# Patient Record
Sex: Female | Born: 1955 | Race: White | Hispanic: No | Marital: Married | Smoking: Current every day smoker
Health system: Southern US, Community
[De-identification: ages and names within clinical notes are randomized; demographics above are authoritative.]

## PROBLEM LIST (undated history)

## (undated) DIAGNOSIS — C801 Malignant (primary) neoplasm, unspecified: Secondary | ICD-10-CM

## (undated) DIAGNOSIS — J90 Pleural effusion, not elsewhere classified: Secondary | ICD-10-CM

## (undated) DIAGNOSIS — E782 Mixed hyperlipidemia: Secondary | ICD-10-CM

## (undated) DIAGNOSIS — Z8489 Family history of other specified conditions: Secondary | ICD-10-CM

## (undated) DIAGNOSIS — I10 Essential (primary) hypertension: Secondary | ICD-10-CM

## (undated) DIAGNOSIS — R011 Cardiac murmur, unspecified: Secondary | ICD-10-CM

## (undated) DIAGNOSIS — J189 Pneumonia, unspecified organism: Secondary | ICD-10-CM

## (undated) HISTORY — DX: Mixed hyperlipidemia: E78.2

## (undated) HISTORY — PX: ABDOMINAL HYSTERECTOMY: SHX81

## (undated) HISTORY — DX: Essential (primary) hypertension: I10

## (undated) HISTORY — DX: Cardiac murmur, unspecified: R01.1

## (undated) HISTORY — PX: VESICOVAGINAL FISTULA CLOSURE W/ TAH: SUR271

## (undated) HISTORY — PX: MULTIPLE TOOTH EXTRACTIONS: SHX2053

## (undated) HISTORY — PX: CARPAL TUNNEL RELEASE: SHX101

## (undated) HISTORY — PX: GANGLION CYST EXCISION: SHX1691

## (undated) HISTORY — PX: SHOULDER OPEN ROTATOR CUFF REPAIR: SHX2407

## (undated) HISTORY — PX: OTHER SURGICAL HISTORY: SHX169

---

## 1982-11-19 HISTORY — PX: PARTIAL HYSTERECTOMY: SHX80

## 1995-11-20 HISTORY — PX: OOPHORECTOMY: SHX86

## 2000-03-28 ENCOUNTER — Ambulatory Visit (HOSPITAL_COMMUNITY): Admission: RE | Admit: 2000-03-28 | Discharge: 2000-03-28 | Payer: Self-pay | Admitting: Family Medicine

## 2000-03-28 ENCOUNTER — Encounter: Payer: Self-pay | Admitting: Family Medicine

## 2004-02-22 ENCOUNTER — Emergency Department (HOSPITAL_COMMUNITY): Admission: EM | Admit: 2004-02-22 | Discharge: 2004-02-22 | Payer: Self-pay | Admitting: Emergency Medicine

## 2004-03-07 ENCOUNTER — Encounter: Payer: Self-pay | Admitting: Orthopedic Surgery

## 2004-04-04 ENCOUNTER — Encounter: Payer: Self-pay | Admitting: Orthopedic Surgery

## 2004-04-04 ENCOUNTER — Ambulatory Visit (HOSPITAL_COMMUNITY): Admission: RE | Admit: 2004-04-04 | Discharge: 2004-04-04 | Payer: Self-pay | Admitting: Orthopedic Surgery

## 2004-08-02 ENCOUNTER — Encounter: Payer: Self-pay | Admitting: Orthopedic Surgery

## 2007-06-07 ENCOUNTER — Emergency Department (HOSPITAL_COMMUNITY): Admission: EM | Admit: 2007-06-07 | Discharge: 2007-06-07 | Payer: Self-pay | Admitting: Emergency Medicine

## 2009-07-12 ENCOUNTER — Ambulatory Visit: Payer: Self-pay | Admitting: Orthopedic Surgery

## 2009-07-12 ENCOUNTER — Encounter (INDEPENDENT_AMBULATORY_CARE_PROVIDER_SITE_OTHER): Payer: Self-pay | Admitting: *Deleted

## 2009-07-12 DIAGNOSIS — R0789 Other chest pain: Secondary | ICD-10-CM

## 2009-07-12 DIAGNOSIS — R079 Chest pain, unspecified: Secondary | ICD-10-CM | POA: Insufficient documentation

## 2009-07-12 DIAGNOSIS — Z720 Tobacco use: Secondary | ICD-10-CM | POA: Insufficient documentation

## 2011-04-06 NOTE — H&P (Signed)
NAME:  Deborah Cooley, Deborah Cooley                        ACCOUNT NO.:  [id number]   MEDICAL RECORD NO.:  [id number]                   PATIENT TYPE:  AMB   LOCATION:  DAY                                  FACILITY:  [facility]   PHYSICIAN:  Vickki Hearing, M.D.           DATE OF BIRTH:  1955/12/15   DATE OF ADMISSION:  DATE OF DISCHARGE:                                HISTORY & PHYSICAL   CHIEF COMPLAINT:  1. Pain and paresthesias, right upper extremity.  2. Dorsal radial mass, right upper extremity.   HISTORY OF PRESENT ILLNESS:  The patient is 55 years old, approximately  March 2005 she slid, fell and injured her right wrist.  She has a mass at  the base of the right thumb on the dorsal radial aspect and also complains  of tingling, weakness of grip and pain in the right hand associated with  numbness.  She appears to have a ganglion cyst and carpal tunnel syndrome of  the right upper extremity.  X-rays are normal.   REVIEW OF SYSTEMS:  Normal per the patient for 10 systems.   ALLERGIES:  None.   PAST MEDICAL HISTORY:  None.   PAST SURGICAL HISTORY:  Right shoulder.   PHARMACY:  [facility].   MEDICATIONS:  None.   FAMILY HISTORY:  Heart disease, cancer.   SOCIAL HISTORY:  She is widowed, smokes a pack of cigarettes per day.  Uses  caffeine.  Does not use alcohol.  She works as a Production designer, theatre/television/film person.  She  completed her education through the 10th grade.   PHYSICAL EXAMINATION:  VITAL SIGNS:  Her weight is 130, pulse 80,  respiratory rate 20.  HEENT:  Essentially benign.  NECK:  Supple.  CHEST/HEART:  Deferred.  ABDOMEN:  No mass.  EXTREMITIES:  Right upper extremity shows a dorsal radial mass at the base  of the right thumb over the extensor compartments.  She has tenderness over  the carpal tunnel with a positive compression test and Tinel's test and  Phalen's test as weakness of grip.   IMPRESSION:  Carpal tunnel syndrome and dorsal radial ganglion cyst.  Recommend excision of cyst  and carpal tunnel release.   Surgery scheduled for Apr 04, 2004.     ___________________________________________                                         Vickki Hearing, M.D.   SEH/MEDQ  D:  04/03/2004  T:  04/03/2004  Job:  [id number]

## 2011-04-06 NOTE — Op Note (Signed)
NAME:  Deborah Cooley, Deborah Cooley                        ACCOUNT NO.:  [id number]   MEDICAL RECORD NO.:  [id number]                   PATIENT TYPE:  AMB   LOCATION:  DAY                                  FACILITY:  [facility]   PHYSICIAN:  Vickki Hearing, M.D.           DATE OF BIRTH:  1956-09-03   DATE OF PROCEDURE:  04/04/2004  DATE OF DISCHARGE:                                 OPERATIVE REPORT   PREOPERATIVE DIAGNOSES:  1. Carpal tunnel syndrome.  2. Dorsal ganglion, right wrist.   POSTOPERATIVE DIAGNOSES:  1. Carpal tunnel syndrome.  2. Dorsal ganglion, right wrist.   OPERATION/PROCEDURE:  1. Carpal tunnel release.  2. Excision of mass, right wrist.   SURGEON:  Vickki Hearing, M.D.   ANESTHESIA:  Bier block.   FINDINGS:  1. Large dorsal lateral ganglion between the extensor compartments #1 and #3     at the base of the thumb.  2. Compression median nerve.   DRAINS:  None.   SPECIMENS:  Ganglion cyst.   PLAN:  Discharge today.  Followup in two days for dressing change.  Keep  hand elevated.   DESCRIPTION OF PROCEDURE:  Mrs. [name] was identified in the holding area.  Her medical record was reviewed.  The history and physical and consent form  both indicated that a right dorsal ganglion was to be excised and a right  carpal tunnel release was to be done.  She confirmed this by marking the  areas of surgery and I signed my initials over each site.  She was given  Ancef and taken to the operating room for Bier block.  That was established  well and her right upper extremity was prepped and draped with sterile  technique.  At that point we took a time out to confirm the procedure, the  patient's identity, review the consent and confirm the proper extremity.  Everyone agreed that the right hand, wrist area was the surgical site and  that the ganglion cyst and was to be released, removed and the carpal tunnel  was to be released.   We started first with the carpal tunnel  release.  We made a midline  incision.  We went down to the palmar fascia divided it, found the distal  extent of the transverse carpal ligament. We performed blunt dissection  beneath the ligament and then released it sharply.  We irrigated the carpal  tunnel, inspected it,  found no space-occupying lesions.  We then closed  with 3-0 nylon in a running fashion.   We then addressed the ganglion.  We did a transverse incision.  We bluntly  dissected down and found the radial nerve sensory branch, retracted the  extensor tendons of the thumb and the nerve and bluntly dissected the  ganglion down to the wrist joint capsule and excised it.  We irrigated,  obtained hemostasis and closed with 2-0 Vicryl and 3-0 nylon.  We injected  10 mL of Marcaine in both areas and then put a bulky dressing on the hand,  released the tourniquet.  All the fingers had good capillary refill and  color, and we took her to the recovery room.  Followup as stated above.      ___________________________________________                                            Vickki Hearing, M.D.   SEH/MEDQ  D:  04/04/2004  T:  04/04/2004  Job:  [id number]

## 2011-05-01 ENCOUNTER — Encounter: Payer: Self-pay | Admitting: Cardiology

## 2011-05-11 ENCOUNTER — Encounter: Payer: Self-pay | Admitting: *Deleted

## 2011-05-21 ENCOUNTER — Encounter: Payer: Self-pay | Admitting: *Deleted

## 2011-05-21 ENCOUNTER — Encounter: Payer: Self-pay | Admitting: Cardiology

## 2011-05-21 ENCOUNTER — Other Ambulatory Visit: Payer: Self-pay | Admitting: Cardiology

## 2011-05-21 ENCOUNTER — Telehealth: Payer: Self-pay | Admitting: *Deleted

## 2011-05-21 ENCOUNTER — Ambulatory Visit (INDEPENDENT_AMBULATORY_CARE_PROVIDER_SITE_OTHER): Payer: PRIVATE HEALTH INSURANCE | Admitting: Cardiology

## 2011-05-21 DIAGNOSIS — F17200 Nicotine dependence, unspecified, uncomplicated: Secondary | ICD-10-CM

## 2011-05-21 DIAGNOSIS — I10 Essential (primary) hypertension: Secondary | ICD-10-CM | POA: Insufficient documentation

## 2011-05-21 DIAGNOSIS — R079 Chest pain, unspecified: Secondary | ICD-10-CM

## 2011-05-21 NOTE — Telephone Encounter (Signed)
Exercise Echo Scheduled for Monday, July 9th @ [facility] Checking percert

## 2011-05-21 NOTE — Telephone Encounter (Signed)
Pt has [employer], no precert for exercise echo.

## 2011-05-21 NOTE — Patient Instructions (Addendum)
   Exercise Echo If the results of your test are normal or stable, you will receive a letter.  If they are abnormal, the nurse will contact you by phone. Follow up based on test results above

## 2011-05-21 NOTE — Progress Notes (Signed)
Clinical Summary [name] is a 55 y.o.female referred for cardiology consultation by Dr. Nydia Bouton. She reports a two-year history of intermittent chest pain. This is described as a dull "ache," usually on the left side of the chest, and sporadic overall. No clear exertional precipitant, or resolution with rest. Symptoms may last for only a few seconds associated with a feeling of difficulty "to get a breath," however may last for hours as well. She states that the symptoms seem more frequent, previously occurring perhaps once a month. They are overall moderate in intensity.  Otherwise no reported palpitations, dizziness, syncope. She does report some leg cramping. States she is on her feet a lot the day. Also reports stress at work.  Today we discussed smoking cessation. She states that she has thought about it, however is not prepared to consider quit attempt.  She states she underwent a stress test approximately 20 years ago, results reportedly reassuring.  States that she has a questionable history of heart murmur.   No Known Allergies  Current outpatient prescriptions:lisinopril-hydrochlorothiazide (PRINZIDE,ZESTORETIC) 10-12.5 MG per tablet, Take 1 tablet by mouth daily.  , Disp: , Rfl: ;  simvastatin (ZOCOR) 20 MG tablet, Take 20 mg by mouth at bedtime.  , Disp: , Rfl:   Past Medical History  Diagnosis Date  . Heart murmur   . Essential hypertension, benign   . Mixed hyperlipidemia   . Endometriosis     Past Surgical History  Procedure Date  . Arthroscopic right shoulder surgery   . Right hand surgery   . Vesicovaginal fistula closure w/ tah   . Partial hysterectomy 1984  . Oophorectomy 1997  . Multiple tooth extractions     Family History  Problem Relation Age of Onset  . Cancer Sister     breast cancer diagonsed age 55 years  . Diabetes    . Heart disease      Female <55  . Arthritis    . Asthma    . Diabetes Mother     age 62  . Heart attack Father    died age 72's  . Hypertension Father   . Hyperlipidemia Other     several siblings  . Thyroid disease Sister     one sister with thyroid disease    Social History [name] reports that she has been smoking Cigarettes.  She has a 40 pack-year smoking history. She has never used smokeless tobacco. [name] reports that she does not drink alcohol.  Review of Systems Otherwise reviewed and negative except as outlined.  Physical Examination Filed Vitals:   05/21/11 0843  BP: 143/81  Pulse: 93  Normally nourished appearing woman in no acute distress. HEENT: Conjunctiva and lids are normal, oropharynx with poor dentition. Neck: Supple, no elevated JVP or carotid bruits, no thyromegaly. Lungs: Clear to auscultation, diminished breath sounds, no wheezing. Cardiac: Regular rate and rhythm, no S3 gallop or rub. Abdomen: Soft, nontender, bowel sounds present. Skin: Warm and dry. Musculoskeletal: No kyphosis. Extremities: No pitting edema, distal pulses 1-2+. Neuropsychiatric: Alert and oriented x3, affect appropriate.   ECG Normal sinus rhythm at 74 beats per minute.    Problem List and Plan

## 2011-05-21 NOTE — Assessment & Plan Note (Signed)
Continue followup with Dr. Margo Aye.

## 2011-05-21 NOTE — Assessment & Plan Note (Signed)
Smoking cessation was discussed. 

## 2011-05-21 NOTE — Assessment & Plan Note (Signed)
Described above, recurrent over the last 2 years, however reportedly with increased frequency. Cardiac risk factors include long-standing tobacco abuse, family history in her father, also diagnosis of hypertension. Resting ECG is normal. She has had no ischemic testing in 20 years. We discussed the matter, and plan will be to proceed with an exercise echocardiogram. Followup to be arranged depending on results. I otherwise recommended risk factor modification strategies.

## 2011-05-28 DIAGNOSIS — R072 Precordial pain: Secondary | ICD-10-CM

## 2011-08-22 ENCOUNTER — Other Ambulatory Visit: Payer: Self-pay

## 2011-08-22 ENCOUNTER — Encounter (HOSPITAL_COMMUNITY): Payer: Self-pay | Admitting: Emergency Medicine

## 2011-08-22 ENCOUNTER — Emergency Department (HOSPITAL_COMMUNITY)
Admission: EM | Admit: 2011-08-22 | Discharge: 2011-08-22 | Disposition: A | Discharge status: Home / Self Care | Payer: PRIVATE HEALTH INSURANCE | Attending: Emergency Medicine | Admitting: Emergency Medicine

## 2011-08-22 DIAGNOSIS — Z79899 Other long term (current) drug therapy: Secondary | ICD-10-CM | POA: Insufficient documentation

## 2011-08-22 DIAGNOSIS — R0789 Other chest pain: Secondary | ICD-10-CM

## 2011-08-22 DIAGNOSIS — R071 Chest pain on breathing: Secondary | ICD-10-CM | POA: Insufficient documentation

## 2011-08-22 DIAGNOSIS — F17200 Nicotine dependence, unspecified, uncomplicated: Secondary | ICD-10-CM | POA: Insufficient documentation

## 2011-08-22 NOTE — ED Notes (Signed)
Pt c/o chest pain after moving furniture last night. Pt states she had a workup at her pcp and diagnosed with chest wall pain.

## 2011-08-22 NOTE — ED Provider Notes (Addendum)
History   Chart scribed for Nicholes Stairs, MD by Enos Fling; the patient was seen in room [facility]04/[facility]; this patient's care was started at 12:23 PM.    CSN: [id number] Arrival date & time: 08/22/2011 11:40 AM  Chief Complaint  Patient presents with  . Chest Pain    recently had pulled chest muscles.    HPI DELPHINA SCHUM is a 55 y.o. female who presents to the Emergency Department complaining of chest pain. Pt states constant (waxing/waning) upper left parasternal chest pain onset last night after moving furniture. She reports pain is same and in the same locations as pain 3-4 weeks ago dx as "pulled muscle" by her PCP. Pain is worse with movement and not worse with deep breathing. She denies sob, n/v, diaphoresis, leg swelling, radiating pain, abd pain, or back pain. Pt admits to dry cough that is unchanged from baseline d/t smoking.    Past Medical History  Diagnosis Date  . Heart murmur   . Essential hypertension, benign   . Mixed hyperlipidemia   . Endometriosis     Past Surgical History  Procedure Date  . Arthroscopic right shoulder surgery   . Right hand surgery   . Vesicovaginal fistula closure w/ tah   . Partial hysterectomy 1984  . Oophorectomy 1997  . Multiple tooth extractions     Family History  Problem Relation Age of Onset  . Cancer Sister     breast cancer diagonsed age 79 years  . Diabetes    . Heart disease      Female <55  . Arthritis    . Asthma    . Diabetes Mother     age 68  . Heart attack Father     died age 39's  . Hypertension Father   . Hyperlipidemia Other     several siblings  . Thyroid disease Sister     one sister with thyroid disease    History  Substance Use Topics  . Smoking status: Current Everyday Smoker -- 1.0 packs/day for 40 years    Types: Cigarettes  . Smokeless tobacco: Never Used  . Alcohol Use: No    OB History    Grav Para Term Preterm Abortions TAB SAB Ect Mult Living                  Review of  Systems 10 Systems reviewed and are negative for acute change except as noted in the HPI.  Allergies  Review of patient's allergies indicates no known allergies.  Home Medications   Current Outpatient Rx  Name Route Sig Dispense Refill  . LISINOPRIL-HYDROCHLOROTHIAZIDE 10-12.5 MG PO TABS Oral Take 1 tablet by mouth daily.      Marland Kitchen SIMVASTATIN 20 MG PO TABS Oral Take 20 mg by mouth at bedtime.        BP 162/81  Pulse 74  Temp(Src) 97.9 F (36.6 C) (Oral)  Resp 20  Ht 5\' 2"  (1.575 m)  Wt 123 lb (55.792 kg)  BMI 22.50 kg/m2  SpO2 98%  Physical Exam  Nursing note and vitals reviewed. Constitutional: She is oriented to person, place, and time. She appears well-developed and well-nourished. No distress.  HENT:  Head: Normocephalic.  Mouth/Throat: Mucous membranes are normal.  Eyes: Conjunctivae are normal.  Neck: Normal range of motion. Neck supple.  Cardiovascular: Normal rate, regular rhythm and intact distal pulses.  Exam reveals no gallop and no friction rub.   No murmur heard. Pulmonary/Chest: Effort normal and breath sounds  normal. She has no wheezes. She has no rales.  Abdominal: Soft. There is no tenderness.  Musculoskeletal: Normal range of motion. She exhibits no edema and no tenderness.  Neurological: She is alert and oriented to person, place, and time.  Skin: Skin is warm and dry. No rash noted.  Psychiatric: She has a normal mood and affect.    ED Course  Procedures - none  OTHER DATA REVIEWED: Nursing notes and vital signs reviewed.   Date: 08/22/2011  Rate:83  Rhythm: normal sinus rhythm  QRS Axis: normal  Intervals: normal  ST/T Wave abnormalities: normal  Conduction Disutrbances:none  Narrative Interpretation:   Old EKG Reviewed: unchanged    MDM  Chest wall pain  IMPRESSION: 1. Chest wall pain     SCRIBE ATTESTATION: I personally performed the services described in this documentation, which was scribed in my presence. The recorded  information has been reviewed and considered. No att. providers found       Nicholes Stairs, MD 08/22/11 1536  Nicholes Stairs, MD 08/22/11 1537

## 2011-09-03 LAB — CBC
HCT: 39.8
Hemoglobin: 13.5
MCHC: 34
MCV: 87.7
RBC: 4.54

## 2011-09-03 LAB — POCT CARDIAC MARKERS
CKMB, poc: 1.4
Troponin i, poc: <0.05

## 2018-04-17 ENCOUNTER — Inpatient Hospital Stay (HOSPITAL_COMMUNITY)
Admission: EM | Admit: 2018-04-17 | Discharge: 2018-04-21 | DRG: 823 | Disposition: A | Discharge status: Home / Self Care | Payer: Self-pay | Attending: Internal Medicine | Admitting: Internal Medicine

## 2018-04-17 ENCOUNTER — Emergency Department (HOSPITAL_COMMUNITY): Payer: Self-pay

## 2018-04-17 ENCOUNTER — Other Ambulatory Visit: Payer: Self-pay

## 2018-04-17 ENCOUNTER — Encounter (HOSPITAL_COMMUNITY): Payer: Self-pay | Admitting: Emergency Medicine

## 2018-04-17 DIAGNOSIS — N390 Urinary tract infection, site not specified: Secondary | ICD-10-CM | POA: Diagnosis present

## 2018-04-17 DIAGNOSIS — J440 Chronic obstructive pulmonary disease with acute lower respiratory infection: Secondary | ICD-10-CM | POA: Diagnosis present

## 2018-04-17 DIAGNOSIS — F17210 Nicotine dependence, cigarettes, uncomplicated: Secondary | ICD-10-CM | POA: Diagnosis present

## 2018-04-17 DIAGNOSIS — Z833 Family history of diabetes mellitus: Secondary | ICD-10-CM

## 2018-04-17 DIAGNOSIS — C8440 Peripheral T-cell lymphoma, not classified, unspecified site: Secondary | ICD-10-CM | POA: Diagnosis present

## 2018-04-17 DIAGNOSIS — Z803 Family history of malignant neoplasm of breast: Secondary | ICD-10-CM

## 2018-04-17 DIAGNOSIS — R188 Other ascites: Secondary | ICD-10-CM | POA: Diagnosis present

## 2018-04-17 DIAGNOSIS — J189 Pneumonia, unspecified organism: Secondary | ICD-10-CM

## 2018-04-17 DIAGNOSIS — Z8249 Family history of ischemic heart disease and other diseases of the circulatory system: Secondary | ICD-10-CM

## 2018-04-17 DIAGNOSIS — R0601 Orthopnea: Secondary | ICD-10-CM | POA: Diagnosis present

## 2018-04-17 DIAGNOSIS — Z720 Tobacco use: Secondary | ICD-10-CM

## 2018-04-17 DIAGNOSIS — C8590 Non-Hodgkin lymphoma, unspecified, unspecified site: Principal | ICD-10-CM | POA: Diagnosis present

## 2018-04-17 DIAGNOSIS — J441 Chronic obstructive pulmonary disease with (acute) exacerbation: Secondary | ICD-10-CM

## 2018-04-17 DIAGNOSIS — R109 Unspecified abdominal pain: Secondary | ICD-10-CM

## 2018-04-17 DIAGNOSIS — J90 Pleural effusion, not elsewhere classified: Secondary | ICD-10-CM

## 2018-04-17 DIAGNOSIS — R161 Splenomegaly, not elsewhere classified: Secondary | ICD-10-CM | POA: Diagnosis present

## 2018-04-17 DIAGNOSIS — R591 Generalized enlarged lymph nodes: Secondary | ICD-10-CM

## 2018-04-17 DIAGNOSIS — J181 Lobar pneumonia, unspecified organism: Secondary | ICD-10-CM

## 2018-04-17 HISTORY — DX: Family history of other specified conditions: Z84.89

## 2018-04-17 HISTORY — DX: Pleural effusion, not elsewhere classified: J90

## 2018-04-17 HISTORY — DX: Pneumonia, unspecified organism: J18.9

## 2018-04-17 LAB — LIPASE, BLOOD: Lipase: 29 U/L (ref 11–51)

## 2018-04-17 LAB — COMPREHENSIVE METABOLIC PANEL
ALBUMIN: 3 g/dL — AB (ref 3.5–5.0)
ALT: 12 U/L — ABNORMAL LOW (ref 14–54)
ANION GAP: 9 (ref 5–15)
AST: 22 U/L (ref 15–41)
Alkaline Phosphatase: 57 U/L (ref 38–126)
BILIRUBIN TOTAL: 1.2 mg/dL (ref 0.3–1.2)
BUN: 11 mg/dL (ref 6–20)
CO2: 24 mmol/L (ref 22–32)
Calcium: 9 mg/dL (ref 8.9–10.3)
Chloride: 98 mmol/L — ABNORMAL LOW (ref 101–111)
Creatinine, Ser: 1.06 mg/dL — ABNORMAL HIGH (ref 0.44–1.00)
GFR calc non Af Amer: 55 mL/min — ABNORMAL LOW (ref >60)
GLUCOSE: 122 mg/dL — AB (ref 65–99)
POTASSIUM: 3.7 mmol/L (ref 3.5–5.1)
Sodium: 131 mmol/L — ABNORMAL LOW (ref 135–145)
TOTAL PROTEIN: 7.9 g/dL (ref 6.5–8.1)

## 2018-04-17 LAB — CBC
HEMATOCRIT: 36 % (ref 36.0–46.0)
Hemoglobin: 11.8 g/dL — ABNORMAL LOW (ref 12.0–15.0)
MCH: 28.7 pg (ref 26.0–34.0)
MCHC: 32.8 g/dL (ref 30.0–36.0)
MCV: 87.6 fL (ref 78.0–100.0)
Platelets: 203 10*3/uL (ref 150–400)
RBC: 4.11 MIL/uL (ref 3.87–5.11)
RDW: 15.6 % — ABNORMAL HIGH (ref 11.5–15.5)
WBC: 7.7 10*3/uL (ref 4.0–10.5)

## 2018-04-17 LAB — I-STAT TROPONIN, ED: TROPONIN I, POC: 0 ng/mL (ref 0.00–0.08)

## 2018-04-17 NOTE — ED Triage Notes (Addendum)
Pt reports abdominal pain "that moves the whole way across my stomach to my back."  Pt denies any n/v/d, weakness.  Pt reports SOB upon exertion.  Marland Kitchen

## 2018-04-18 ENCOUNTER — Other Ambulatory Visit: Payer: Self-pay

## 2018-04-18 ENCOUNTER — Emergency Department (HOSPITAL_COMMUNITY): Payer: Self-pay

## 2018-04-18 ENCOUNTER — Encounter (HOSPITAL_COMMUNITY): Payer: Self-pay | Admitting: Radiology

## 2018-04-18 DIAGNOSIS — R1010 Upper abdominal pain, unspecified: Secondary | ICD-10-CM

## 2018-04-18 DIAGNOSIS — J90 Pleural effusion, not elsewhere classified: Secondary | ICD-10-CM

## 2018-04-18 DIAGNOSIS — J189 Pneumonia, unspecified organism: Secondary | ICD-10-CM

## 2018-04-18 DIAGNOSIS — F17210 Nicotine dependence, cigarettes, uncomplicated: Secondary | ICD-10-CM

## 2018-04-18 DIAGNOSIS — Z720 Tobacco use: Secondary | ICD-10-CM

## 2018-04-18 DIAGNOSIS — Z90710 Acquired absence of both cervix and uterus: Secondary | ICD-10-CM

## 2018-04-18 DIAGNOSIS — R109 Unspecified abdominal pain: Secondary | ICD-10-CM

## 2018-04-18 DIAGNOSIS — J441 Chronic obstructive pulmonary disease with (acute) exacerbation: Secondary | ICD-10-CM

## 2018-04-18 DIAGNOSIS — Z8742 Personal history of other diseases of the female genital tract: Secondary | ICD-10-CM

## 2018-04-18 DIAGNOSIS — R161 Splenomegaly, not elsewhere classified: Secondary | ICD-10-CM

## 2018-04-18 DIAGNOSIS — R1013 Epigastric pain: Secondary | ICD-10-CM

## 2018-04-18 DIAGNOSIS — I700 Atherosclerosis of aorta: Secondary | ICD-10-CM

## 2018-04-18 DIAGNOSIS — C8440 Peripheral T-cell lymphoma, not classified, unspecified site: Secondary | ICD-10-CM | POA: Diagnosis present

## 2018-04-18 DIAGNOSIS — R590 Localized enlarged lymph nodes: Secondary | ICD-10-CM

## 2018-04-18 DIAGNOSIS — J9811 Atelectasis: Secondary | ICD-10-CM

## 2018-04-18 LAB — URINALYSIS, ROUTINE W REFLEX MICROSCOPIC
Bilirubin Urine: NEGATIVE
Glucose, UA: NEGATIVE mg/dL
Hgb urine dipstick: NEGATIVE
Ketones, ur: 5 mg/dL — AB
Leukocytes, UA: NEGATIVE
Nitrite: NEGATIVE
Protein, ur: 30 mg/dL — AB
Specific Gravity, Urine: 1.02 (ref 1.005–1.030)
pH: 5 (ref 5.0–8.0)

## 2018-04-18 LAB — LACTATE DEHYDROGENASE, PLEURAL OR PERITONEAL FLUID: LD FL: 164 U/L — AB (ref 3–23)

## 2018-04-18 LAB — I-STAT TROPONIN, ED: Troponin i, poc: 0 ng/mL (ref 0.00–0.08)

## 2018-04-18 LAB — BODY FLUID CELL COUNT WITH DIFFERENTIAL
Lymphs, Fluid: 71 %
MONOCYTE-MACROPHAGE-SEROUS FLUID: 24 % — AB (ref 50–90)
NEUTROPHIL FLUID: 5 % (ref 0–25)
WBC FLUID: 6715 uL — AB (ref 0–1000)

## 2018-04-18 LAB — PROTEIN, PLEURAL OR PERITONEAL FLUID: Total protein, fluid: 4.3 g/dL

## 2018-04-18 LAB — GLUCOSE, PLEURAL OR PERITONEAL FLUID: GLUCOSE FL: 110 mg/dL

## 2018-04-18 LAB — LACTATE DEHYDROGENASE: LDH: 261 U/L — ABNORMAL HIGH (ref 98–192)

## 2018-04-18 LAB — PROTEIN, TOTAL: TOTAL PROTEIN: 7.4 g/dL (ref 6.5–8.1)

## 2018-04-18 LAB — I-STAT CG4 LACTIC ACID, ED: Lactic Acid, Venous: 1.67 mmol/L (ref 0.5–1.9)

## 2018-04-18 MED ORDER — PREDNISONE 20 MG PO TABS
40.0000 mg | ORAL_TABLET | Freq: Every day | ORAL | Status: DC
  Administered 2018-04-19: 40 mg via ORAL
  Filled 2018-04-18: qty 2

## 2018-04-18 MED ORDER — AZITHROMYCIN 500 MG IV SOLR
500.0000 mg | INTRAVENOUS | Status: DC
  Filled 2018-04-18: qty 500

## 2018-04-18 MED ORDER — SODIUM CHLORIDE 0.9 % IV SOLN: 1.0000 g | INTRAVENOUS | Status: DC

## 2018-04-18 MED ORDER — ALBUTEROL SULFATE (2.5 MG/3ML) 0.083% IN NEBU: 2.5000 mg | INHALATION_SOLUTION | RESPIRATORY_TRACT | Status: DC | PRN

## 2018-04-18 MED ORDER — IPRATROPIUM-ALBUTEROL 0.5-2.5 (3) MG/3ML IN SOLN
3.0000 mL | Freq: Four times a day (QID) | RESPIRATORY_TRACT | Status: DC
  Administered 2018-04-18: 3 mL via RESPIRATORY_TRACT
  Filled 2018-04-18 (×2): qty 3

## 2018-04-18 MED ORDER — IOPAMIDOL (ISOVUE-370) INJECTION 76%
100.0000 mL | Freq: Once | INTRAVENOUS | Status: AC | PRN
  Administered 2018-04-18: 100 mL via INTRAVENOUS

## 2018-04-18 MED ORDER — SODIUM CHLORIDE 0.9 % IV SOLN
1.0000 g | Freq: Once | INTRAVENOUS | Status: AC
  Administered 2018-04-18: 1 g via INTRAVENOUS
  Filled 2018-04-18: qty 10

## 2018-04-18 MED ORDER — METHYLPREDNISOLONE SODIUM SUCC 125 MG IJ SOLR
60.0000 mg | Freq: Once | INTRAMUSCULAR | Status: AC
  Administered 2018-04-18: 60 mg via INTRAVENOUS
  Filled 2018-04-18: qty 2

## 2018-04-18 MED ORDER — NICOTINE 14 MG/24HR TD PT24
14.0000 mg | MEDICATED_PATCH | Freq: Every day | TRANSDERMAL | Status: DC
  Administered 2018-04-19 – 2018-04-21 (×3): 14 mg via TRANSDERMAL
  Filled 2018-04-18 (×3): qty 1

## 2018-04-18 MED ORDER — IOPAMIDOL (ISOVUE-370) INJECTION 76%
INTRAVENOUS | Status: AC
  Filled 2018-04-18: qty 100

## 2018-04-18 MED ORDER — HEPARIN SODIUM (PORCINE) 5000 UNIT/ML IJ SOLN
5000.0000 [IU] | Freq: Three times a day (TID) | INTRAMUSCULAR | Status: DC
  Administered 2018-04-18 – 2018-04-20 (×6): 5000 [IU] via SUBCUTANEOUS
  Filled 2018-04-18 (×7): qty 1

## 2018-04-18 MED ORDER — POLYETHYLENE GLYCOL 3350 17 G PO PACK: 17.0000 g | PACK | Freq: Every day | ORAL | Status: DC | PRN

## 2018-04-18 MED ORDER — AZITHROMYCIN 500 MG IV SOLR
500.0000 mg | Freq: Once | INTRAVENOUS | Status: AC
  Administered 2018-04-18: 500 mg via INTRAVENOUS
  Filled 2018-04-18: qty 500

## 2018-04-18 MED ORDER — SODIUM CHLORIDE 0.9 % IV SOLN
1.0000 g | INTRAVENOUS | Status: DC
  Administered 2018-04-19: 1 g via INTRAVENOUS
  Filled 2018-04-18: qty 10

## 2018-04-18 NOTE — Procedures (Signed)
Thoracentesis Procedure Note  Pre-operative Diagnosis:  Pleural effusion  Indications:  Evaluation of pleural fluid  Procedure Details:  Informed consent was obtained after explanation of the risks and benefits of the procedure, refer to the consent documentation.  Time-out Performed immediately prior to the procedure.  All available chest radiographs were reviewed and the patient was subsequently placed in a sitting/semi-recumbent position.  Using ultrasound guidance a moderate) pleural effusion was noted on the right.  The area was prepped with chlorhexadine and draped in sterile fashion.  Following this 1% lidocaine was injected subcutaneously and deep to provide anesthesia.  A small incision was then made parallel and superior to the rib, the thoracentesis needle with catheter was inserted into the chest wall and advanced under constant aspiration.  Upon aspiration of pleural fluid, the catheter was advanced into the pleural space and the needle was removed.  The catheter was then connected to a drainange bag and the fluid was removed under manual drainage. The catheter was then removed during slow forced exhalation and a sterile bandage was placed.  Findings:   574mL of cloudy yellow pleural fluid was removed.  Fluid was sent for cytology, cell count, gram stain & culture, protein, glucose, and LDH.  Condition: The patient tolerated the procedure well and remains in the same condition as pre-procedure.  Complications: None; patient tolerated the procedure well.  Plan: Post-procedure ultrasound showed appropriate lung sliding, which makes a pneumothorax unlikely.

## 2018-04-18 NOTE — Consult Note (Signed)
[facility] Surgery Consult Note  Deborah Cooley 12/21/1955  [id number].    Requesting MD: Daryll Drown  Chief Complaint/Reason for Consult: lymphadenopathy HPI:  Patient is a 62 year old female who presented to [facility] with abdominal pain and enlarged lymph nodes. Symptoms started 2 weeks ago. Abdominal pain described as generalized and mostly with coughing or movement, radiates to her back. Denies nausea, vomiting, diarrhea, constipation, bloody stools, melena. Has never had a colonoscopy and states she never will. Past abdominal surgeries include hysterectomy in her 47s for endometriosis. Patient reports noticing swelling in lymph nodes in neck over the last 2 weeks, denies anything similar in the past. Also reports increased fatigue in the last few weeks. Unsure of fever or chills but does report occasional night sweats. Patient also reports cough with clear sputum, worsened from baseline cough, and dysuria with increased urinary urgency. Denies chest pain or palpitations.   Patient does not have a PCP and does not see a physician regularly. No other diagnosed medical issues and she does not report taking any daily medications. She does report a 46 year smoking history and smokes 1/2 -1 ppd. She denies alcohol or illicit drug use. She does not have a family history of colon cancer or blood cancers that she is aware of, had one sister with breast cancer.   ROS: Review of Systems  Constitutional: Positive for diaphoresis (night sweats) and malaise/fatigue. Negative for chills and fever.  Respiratory: Positive for cough, sputum production and shortness of breath. Negative for hemoptysis and wheezing.   Cardiovascular: Negative for chest pain and palpitations.  Gastrointestinal: Positive for abdominal pain. Negative for blood in stool, constipation, diarrhea, melena, nausea and vomiting.  Genitourinary: Positive for dysuria, frequency and urgency.  Musculoskeletal: Positive for back pain.   Neurological: Negative for weakness.  All other systems reviewed and are negative.   No family history on file.  History reviewed. No pertinent past medical history.  History reviewed. No pertinent surgical history.  Social History:  has no tobacco, alcohol, and drug history on file.  Allergies: No Known Allergies   (Not in a hospital admission)  Blood pressure (!) 100/42, pulse 99, temperature 98 F (36.7 C), temperature source Oral, resp. rate (!) 22, height '5\' 2"'  (1.575 m), weight 56.2 kg (124 lb), SpO2 95 %. Physical Exam: Physical Exam  Constitutional: She is oriented to person, place, and time. She appears well-developed and well-nourished. She is cooperative.  Non-toxic appearance. She does not appear ill. No distress.  HENT:  Head: Normocephalic and atraumatic.  Right Ear: External ear normal.  Left Ear: External ear normal.  Nose: Nose normal.  Mouth/Throat: Oropharynx is clear and moist and mucous membranes are normal.  Eyes: Pupils are equal, round, and reactive to light. Conjunctivae, EOM and lids are normal. No scleral icterus.  Neck: Normal range of motion and phonation normal. Neck supple. No thyromegaly present.  Cardiovascular: Normal rate and regular rhythm.  Pulses:      Radial pulses are 2+ on the right side, and 2+ on the left side.       Dorsalis pedis pulses are 2+ on the right side, and 2+ on the left side.  No extremity edema  Pulmonary/Chest: Effort normal. She has rales in the right lower field and the left lower field.  Abdominal: Soft. Bowel sounds are normal. She exhibits no distension and no mass. There is splenomegaly. There is no hepatomegaly. There is no tenderness. There is no rigidity, no rebound and no guarding. No  hernia.  Musculoskeletal:  ROM grossly intact in bilateral upper and lower extremities  Lymphadenopathy:    She has cervical adenopathy.       Right cervical: Posterior cervical adenopathy present.       Left cervical:  Superficial cervical adenopathy present.    She has axillary adenopathy.       Left axillary: Lateral adenopathy present.       Right: No supraclavicular adenopathy present.       Left: No supraclavicular adenopathy present.  Neurological: She is alert and oriented to person, place, and time. She has normal strength. No sensory deficit.  Skin: Skin is warm, dry and intact. She is not diaphoretic. No pallor.  Psychiatric: She has a normal mood and affect. Her speech is normal and behavior is normal.    Results for orders placed or performed during the hospital encounter of 04/17/18 (from the past 48 hour(s))  Lipase, blood     Status: None   Collection Time: 04/17/18 10:53 PM  Result Value Ref Range   Lipase 29 11 - 51 U/L    Comment: Performed at [facility], [place] [address] [place], [place] [address]  Comprehensive metabolic panel     Status: Abnormal   Collection Time: 04/17/18 10:53 PM  Result Value Ref Range   Sodium 131 (L) 135 - 145 mmol/L   Potassium 3.7 3.5 - 5.1 mmol/L   Chloride 98 (L) 101 - 111 mmol/L   CO2 24 22 - 32 mmol/L   Glucose, Bld 122 (H) 65 - 99 mg/dL   BUN 11 6 - 20 mg/dL   Creatinine, Ser 1.06 (H) 0.44 - 1.00 mg/dL   Calcium 9.0 8.9 - 10.3 mg/dL   Total Protein 7.9 6.5 - 8.1 g/dL   Albumin 3.0 (L) 3.5 - 5.0 g/dL   AST 22 15 - 41 U/L   ALT 12 (L) 14 - 54 U/L   Alkaline Phosphatase 57 38 - 126 U/L   Total Bilirubin 1.2 0.3 - 1.2 mg/dL   GFR calc non Af Amer 55 (L) >60 mL/min   GFR calc Af Amer >60 >60 mL/min    Comment: (NOTE) The eGFR has been calculated using the CKD EPI equation. This calculation has not been validated in all clinical situations. eGFR's persistently <60 mL/min signify possible Chronic Kidney Disease.    Anion gap 9 5 - 15    Comment: Performed at [facility] [address], [place], [place] [address]  CBC     Status: Abnormal   Collection Time: 04/17/18 10:53 PM  Result Value Ref Range   WBC 7.7 4.0 - 10.5 K/uL    RBC 4.11 3.87 - 5.11 MIL/uL   Hemoglobin 11.8 (L) 12.0 - 15.0 g/dL   HCT 36.0 36.0 - 46.0 %   MCV 87.6 78.0 - 100.0 fL   MCH 28.7 26.0 - 34.0 pg   MCHC 32.8 30.0 - 36.0 g/dL   RDW 15.6 (H) 11.5 - 15.5 %   Platelets 203 150 - 400 K/uL    Comment: Performed at [facility], [place] [address], [place], [place] [address]  I-stat troponin, ED     Status: None   Collection Time: 04/17/18 11:04 PM  Result Value Ref Range   Troponin i, poc 0.00 0.00 - 0.08 ng/mL   Comment 3            Comment: Due to the release kinetics of cTnI, a negative result within the  first hours of the onset of symptoms does not rule out myocardial infarction with certainty. If myocardial infarction is still suspected, repeat the test at appropriate intervals.   Urinalysis, Routine w reflex microscopic     Status: Abnormal   Collection Time: 04/18/18  5:52 AM  Result Value Ref Range   Color, Urine AMBER (A) YELLOW    Comment: BIOCHEMICALS MAY BE AFFECTED BY COLOR   APPearance HAZY (A) CLEAR   Specific Gravity, Urine 1.020 1.005 - 1.030   pH 5.0 5.0 - 8.0   Glucose, UA NEGATIVE NEGATIVE mg/dL   Hgb urine dipstick NEGATIVE NEGATIVE   Bilirubin Urine NEGATIVE NEGATIVE   Ketones, ur 5 (A) NEGATIVE mg/dL   Protein, ur 30 (A) NEGATIVE mg/dL   Nitrite NEGATIVE NEGATIVE   Leukocytes, UA NEGATIVE NEGATIVE   RBC / HPF 0-5 0 - 5 RBC/hpf   WBC, UA 0-5 0 - 5 WBC/hpf   Bacteria, UA RARE (A) NONE SEEN   Squamous Epithelial / LPF 0-5 0 - 5   Mucus PRESENT    Hyaline Casts, UA PRESENT     Comment: Performed at [facility], [address] [place], [place] [address]  I-stat troponin, ED     Status: None   Collection Time: 04/18/18  7:43 AM  Result Value Ref Range   Troponin i, poc 0.00 0.00 - 0.08 ng/mL   Comment 3            Comment: Due to the release kinetics of cTnI, a negative result within the first hours of the onset of symptoms does not rule out myocardial infarction with certainty. If myocardial  infarction is still suspected, repeat the test at appropriate intervals.   I-Stat CG4 Lactic Acid, ED     Status: None   Collection Time: 04/18/18  7:46 AM  Result Value Ref Range   Lactic Acid, Venous 1.67 0.5 - 1.9 mmol/L   Dg Chest 2 View  Result Date: 04/17/2018 CLINICAL DATA:  Abdominal pain.  Shortness of breath. EXAM: CHEST - 2 VIEW COMPARISON:  None. FINDINGS: The heart size is normal. The hila and mediastinum are unremarkable. A 2.2 cm eggshell calcification is seen in the right thoracic inlet. No pneumothorax. Mild interstitial prominence bilaterally. Small bilateral pleural effusions. No nodules or masses. No other acute abnormalities. IMPRESSION: 1. Mild interstitial opacities in the lungs are nonspecific. Edema could have this appearance but the lack of cardiomegaly would be unusual. Atypical infection is possible. 2. Small bilateral effusions. 3. The eggshell calcification in the right thoracic inlet is nonspecific. This could be a vascular structure or a calcified nodule in the thyroid. Electronically Signed   By: Dorise Bullion III M.D   On: 04/17/2018 23:32   Ct Angio Chest Pe W/cm &/or Wo Cm  Addendum Date: 04/18/2018   ADDENDUM REPORT: 04/18/2018 08:59 ADDENDUM: Add to IMPRESSION: There is wall thickening in the urinary bladder with mild soft tissue stranding adjacent to the bladder. Suspect a degree of cystitis. Electronically Signed   By: Lowella Grip III M.D.   On: 04/18/2018 08:59   Result Date: 04/18/2018 CLINICAL DATA:  Shortness of breath.  Abdominal pain EXAM: CT ANGIOGRAPHY CHEST CT ABDOMEN AND PELVIS WITH CONTRAST TECHNIQUE: Multidetector CT imaging of the chest was performed using the standard protocol during bolus administration of intravenous contrast. Multiplanar CT image reconstructions and MIPs were obtained to evaluate the vascular anatomy. Multidetector CT imaging of the abdomen and pelvis was performed using the standard protocol  during bolus administration  of intravenous contrast. CONTRAST:  13m ISOVUE-370 IOPAMIDOL (ISOVUE-370) INJECTION 76% COMPARISON:  Chest radiograph Apr 17, 2018 FINDINGS: CTA CHEST FINDINGS Cardiovascular: There is no demonstrable pulmonary embolus. There is no thoracic aortic aneurysm or dissection. There are foci of calcification in the proximal visualized great vessels. There is atherosclerotic calcification in the thoracic aorta. There are scattered foci of coronary artery calcification. There is a small pericardial effusion. Mediastinum/Nodes: There is a partially calcified mass arising from the right lobe of the thyroid measuring 2.7 x 1.9 cm. Thyroid otherwise appears unremarkable. There is extensive adenopathy throughout the chest. There are multiple supraclavicular lymph nodes, largest measuring 1.0 x 0.9 cm. There are multiple axillary lymph nodes. The largest axillary lymph node on the left measures 2.5 x 2.1 cm cm. The largest axillary lymph node on the right measures 2.3 x 2.0 cm. There is adenopathy throughout the mediastinum. There are multiple aortopulmonary window region lymph nodes, largest measuring 1.4 x 1.4 cm. There is extensive adenopathy in the mediastinum surrounding the trachea and carina. There is a right pretracheal lymph node measuring 1.9 x 1.8 cm. There is a lymph node to the left of the origin of the carina measuring 1.6 x 1.5 cm. There is extensive adenopathy in the right hilar region. The largest individual lymph node in the right hilum measures 2.2 x 1.4 cm. There are multiple left hilar lymph nodes. The largest left hilar lymph node measures 1.8 x 1.3 cm. There is extensive subcarinal adenopathy. Largest subcarinal lymph node measures 2.8 x 2.4 cm. There are retrocrural lymph nodes bilaterally, largest on the left measuring 1.3 x 1.0 cm. There are several small lymph nodes at the level of the right pericardiophrenic angle. No esophageal lesions are evident. Lungs/Pleura: There are free-flowing pleural  effusions bilaterally, larger on the right than on the left. There is consolidation in both lung bases, likely primarily due to compressive atelectasis. There is atelectatic change with volume loss in the right middle lobe medially with focal consolidation adjacent to the right heart border in the medial segment right middle lobe. No pulmonary nodular type lesion is evident on this study. Musculoskeletal: There are no blastic or lytic bone lesions. No chest wall lesions are evident. Several benign-appearing calcifications are noted in the right breast. Review of the MIP images confirms the above findings. CT ABDOMEN and PELVIS FINDINGS Hepatobiliary: No focal liver lesions are appreciable. There is no appreciable gallbladder wall thickening. No biliary duct dilatation. Pancreas: No pancreatic mass or inflammatory focus. Extensive peripancreatic adenopathy is noted. Spleen: Spleen measures 15.0 x 12.4 x 6.8 cm with a measured splenic volume of 632 cubic cm. No focal splenic lesions are evident. Adrenals/Urinary Tract: Adrenals bilaterally appear unremarkable. Kidneys bilaterally show no evident mass or hydronephrosis. No renal or ureteral calculus. No hydronephrosis. Urinary bladder is midline with thickening of the urinary bladder wall. There is also mild soft tissue stranding surrounding the urinary bladder. Stomach/Bowel: There is no appreciable bowel wall or mesenteric thickening. There is no evident bowel obstruction. No free air or portal venous air. Vascular/Lymphatic: There is atherosclerotic calcification throughout the aorta and iliac arteries. There is moderate narrowing of the aorta with what appears to be hemodynamically significant obstruction in both common femoral arteries. No aneurysm. Major mesenteric arterial vessels appear patent. There is extensive adenopathy throughout the abdomen. There are multiple enlarged lymph nodes in the peripancreatic region, largest measuring 1.7 x 1.5 cm. Scattered  prominent mesenteric lymph nodes are noted throughout the  abdomen. There is extensive retroperitoneal adenopathy. Largest retroperitoneal lymph node measures 2.5 x 1.5 cm. There is adenopathy to the right of the celiac axis measuring 2.3 x 1.5 cm. Adenopathy is seen in both external iliac node chains. The largest lymph node in this area on the right measures 2.6 x 1.7 cm. The largest lymph node in this region on the left measures 2.0 by 1.4 cm. There is also extensive adenopathy more posteriorly in the pelvis at the level of each acetabulum. The largest of these lymph nodes on the right measures 3.1 x 1.7 cm. Largest of these lymph nodes on the left measures 3.2 x 1.6 cm. There is adenopathy in both inguinal regions. Largest of these lymph nodes in the inguinal regions on the right measures 1.4 x 1.3 cm. Largest lymph node in the left inguinal region measures 1.8 x 1.6 cm. Reproductive: Uterus is apparently absent. No pelvic mass apart from adenopathy seen. Other: Appendix region appears normal. No abscess is evident in the abdomen or pelvis. There is mild ascites. Musculoskeletal: There are no appreciable blastic or lytic bone lesions. No intramuscular or abdominal wall lesions are evident. Review of the MIP images confirms the above findings. IMPRESSION: CT angiogram chest: 1. No demonstrable pulmonary embolus. No thoracic aortic aneurysm or dissection. Multiple foci of aortic atherosclerosis noted as well as foci of calcification in great vessels and coronary arteries. 2. Extensive adenopathy at multiple sites as summarized above. This extensive adenopathy raises concern for potential lymphoma. Small cell lung carcinoma could present in this manner as well. 3. Moderate pleural effusions bilaterally, larger on the right than on the left, with compressive atelectasis in the lung bases. There is focal consolidation felt to represent pneumonia adjacent to the right heart border in the medial segment right middle  lobe. 4. Partially calcified dominant mass right lobe of thyroid. This mass may warrant nonemergent thyroid ultrasound to further evaluate. CT abdomen and pelvis: 1. Widespread abdominal and pelvic adenopathy. Splenomegaly. This combination of findings raises concern for lymphoma as most likely etiology. 2.  Mild ascites. 3. No abscess in the abdomen or pelvis. No periappendiceal region inflammation. 4. Extent extensive aortoiliac atherosclerosis with what appears to be hemodynamically significant obstruction in the common iliac arteries bilaterally. 5.  Uterus apparently absent. Aortic Atherosclerosis (ICD10-I70.0). Electronically Signed: By: Lowella Grip III M.D. On: 04/18/2018 08:41   Ct Abdomen Pelvis W Contrast  Addendum Date: 04/18/2018   ADDENDUM REPORT: 04/18/2018 08:59 ADDENDUM: Add to IMPRESSION: There is wall thickening in the urinary bladder with mild soft tissue stranding adjacent to the bladder. Suspect a degree of cystitis. Electronically Signed   By: Lowella Grip III M.D.   On: 04/18/2018 08:59   Result Date: 04/18/2018 CLINICAL DATA:  Shortness of breath.  Abdominal pain EXAM: CT ANGIOGRAPHY CHEST CT ABDOMEN AND PELVIS WITH CONTRAST TECHNIQUE: Multidetector CT imaging of the chest was performed using the standard protocol during bolus administration of intravenous contrast. Multiplanar CT image reconstructions and MIPs were obtained to evaluate the vascular anatomy. Multidetector CT imaging of the abdomen and pelvis was performed using the standard protocol during bolus administration of intravenous contrast. CONTRAST:  170m ISOVUE-370 IOPAMIDOL (ISOVUE-370) INJECTION 76% COMPARISON:  Chest radiograph Apr 17, 2018 FINDINGS: CTA CHEST FINDINGS Cardiovascular: There is no demonstrable pulmonary embolus. There is no thoracic aortic aneurysm or dissection. There are foci of calcification in the proximal visualized great vessels. There is atherosclerotic calcification in the thoracic  aorta. There are scattered foci of coronary artery  calcification. There is a small pericardial effusion. Mediastinum/Nodes: There is a partially calcified mass arising from the right lobe of the thyroid measuring 2.7 x 1.9 cm. Thyroid otherwise appears unremarkable. There is extensive adenopathy throughout the chest. There are multiple supraclavicular lymph nodes, largest measuring 1.0 x 0.9 cm. There are multiple axillary lymph nodes. The largest axillary lymph node on the left measures 2.5 x 2.1 cm cm. The largest axillary lymph node on the right measures 2.3 x 2.0 cm. There is adenopathy throughout the mediastinum. There are multiple aortopulmonary window region lymph nodes, largest measuring 1.4 x 1.4 cm. There is extensive adenopathy in the mediastinum surrounding the trachea and carina. There is a right pretracheal lymph node measuring 1.9 x 1.8 cm. There is a lymph node to the left of the origin of the carina measuring 1.6 x 1.5 cm. There is extensive adenopathy in the right hilar region. The largest individual lymph node in the right hilum measures 2.2 x 1.4 cm. There are multiple left hilar lymph nodes. The largest left hilar lymph node measures 1.8 x 1.3 cm. There is extensive subcarinal adenopathy. Largest subcarinal lymph node measures 2.8 x 2.4 cm. There are retrocrural lymph nodes bilaterally, largest on the left measuring 1.3 x 1.0 cm. There are several small lymph nodes at the level of the right pericardiophrenic angle. No esophageal lesions are evident. Lungs/Pleura: There are free-flowing pleural effusions bilaterally, larger on the right than on the left. There is consolidation in both lung bases, likely primarily due to compressive atelectasis. There is atelectatic change with volume loss in the right middle lobe medially with focal consolidation adjacent to the right heart border in the medial segment right middle lobe. No pulmonary nodular type lesion is evident on this study. Musculoskeletal:  There are no blastic or lytic bone lesions. No chest wall lesions are evident. Several benign-appearing calcifications are noted in the right breast. Review of the MIP images confirms the above findings. CT ABDOMEN and PELVIS FINDINGS Hepatobiliary: No focal liver lesions are appreciable. There is no appreciable gallbladder wall thickening. No biliary duct dilatation. Pancreas: No pancreatic mass or inflammatory focus. Extensive peripancreatic adenopathy is noted. Spleen: Spleen measures 15.0 x 12.4 x 6.8 cm with a measured splenic volume of 632 cubic cm. No focal splenic lesions are evident. Adrenals/Urinary Tract: Adrenals bilaterally appear unremarkable. Kidneys bilaterally show no evident mass or hydronephrosis. No renal or ureteral calculus. No hydronephrosis. Urinary bladder is midline with thickening of the urinary bladder wall. There is also mild soft tissue stranding surrounding the urinary bladder. Stomach/Bowel: There is no appreciable bowel wall or mesenteric thickening. There is no evident bowel obstruction. No free air or portal venous air. Vascular/Lymphatic: There is atherosclerotic calcification throughout the aorta and iliac arteries. There is moderate narrowing of the aorta with what appears to be hemodynamically significant obstruction in both common femoral arteries. No aneurysm. Major mesenteric arterial vessels appear patent. There is extensive adenopathy throughout the abdomen. There are multiple enlarged lymph nodes in the peripancreatic region, largest measuring 1.7 x 1.5 cm. Scattered prominent mesenteric lymph nodes are noted throughout the abdomen. There is extensive retroperitoneal adenopathy. Largest retroperitoneal lymph node measures 2.5 x 1.5 cm. There is adenopathy to the right of the celiac axis measuring 2.3 x 1.5 cm. Adenopathy is seen in both external iliac node chains. The largest lymph node in this area on the right measures 2.6 x 1.7 cm. The largest lymph node in this region  on the left measures 2.0 by  1.4 cm. There is also extensive adenopathy more posteriorly in the pelvis at the level of each acetabulum. The largest of these lymph nodes on the right measures 3.1 x 1.7 cm. Largest of these lymph nodes on the left measures 3.2 x 1.6 cm. There is adenopathy in both inguinal regions. Largest of these lymph nodes in the inguinal regions on the right measures 1.4 x 1.3 cm. Largest lymph node in the left inguinal region measures 1.8 x 1.6 cm. Reproductive: Uterus is apparently absent. No pelvic mass apart from adenopathy seen. Other: Appendix region appears normal. No abscess is evident in the abdomen or pelvis. There is mild ascites. Musculoskeletal: There are no appreciable blastic or lytic bone lesions. No intramuscular or abdominal wall lesions are evident. Review of the MIP images confirms the above findings. IMPRESSION: CT angiogram chest: 1. No demonstrable pulmonary embolus. No thoracic aortic aneurysm or dissection. Multiple foci of aortic atherosclerosis noted as well as foci of calcification in great vessels and coronary arteries. 2. Extensive adenopathy at multiple sites as summarized above. This extensive adenopathy raises concern for potential lymphoma. Small cell lung carcinoma could present in this manner as well. 3. Moderate pleural effusions bilaterally, larger on the right than on the left, with compressive atelectasis in the lung bases. There is focal consolidation felt to represent pneumonia adjacent to the right heart border in the medial segment right middle lobe. 4. Partially calcified dominant mass right lobe of thyroid. This mass may warrant nonemergent thyroid ultrasound to further evaluate. CT abdomen and pelvis: 1. Widespread abdominal and pelvic adenopathy. Splenomegaly. This combination of findings raises concern for lymphoma as most likely etiology. 2.  Mild ascites. 3. No abscess in the abdomen or pelvis. No periappendiceal region inflammation. 4. Extent  extensive aortoiliac atherosclerosis with what appears to be hemodynamically significant obstruction in the common iliac arteries bilaterally. 5.  Uterus apparently absent. Aortic Atherosclerosis (ICD10-I70.0). Electronically Signed: By: Lowella Grip III M.D. On: 04/18/2018 08:41      Assessment/Plan Tobacco abuse - 46 year smoking hx  Pneumonia with bilateral pleural effusions - per primary service UTI - concern for cystitis on CT and urinary symptoms, per primary service  Abdominal pain - likely related to below, no peritonitis on exam Extensive Lymphadenopathy - concern for lymphoma - history of increased fatigue and night sweats - CT chest/abd/pelvis: extensive lymphadenopathy in chest/abd/pelvis with splenomegaly - patient with a few palpable lymph nodes that feel firm but mobile - L axilla, R posterior cervical  - no OR until pneumonia has improved some, would tentatively plan for early next week (possibly Monday or Tuesday, depending on OR availability) - if patient felt stable for discharge prior to then, could have her follow up in the office  FEN: regular diet VTE: SCDs, SQ heparin ID: azithromycin/rocephin 5/31>>   Brigid Re, [facility] Surgery 04/18/2018, 1:35 PM Pager: [phone] Consults: [phone] Mon-Fri 7:00 am-4:30 pm Sat-Sun 7:00 am-11:30 am

## 2018-04-18 NOTE — ED Notes (Signed)
Patient transported to CT 

## 2018-04-18 NOTE — H&P (Addendum)
Date: 04/18/2018               Patient Name:  Deborah Cooley MRN: [id number]  DOB: 08/22/1956 Age / Sex: 62 y.o., female   PCP: Patient, No Pcp Per         Medical Service: Internal Medicine Teaching Service         Attending Physician: Dr. Sid Falcon, MD    First Contact: Dr. Ronalee Red Pager: [phone]  Second Contact: Dr. Danford Bad Pager: [phone]       After Hours (After 5p/  First Contact Pager: [phone]  weekends / holidays): Second Contact Pager: [phone]   Chief Complaint: abdominal pain and lymph node swelling.  History of Present Illness:  This is a 62 y.o. woman with PMHx of ongoing tobacco use and endometriosis s/p hysterectomy in her 93s who presents to the ED with complaint of abdominal discomfort and enlarging lymph nodes.  She reports symptom onset about 2 weeks ago with upper quadrant and epigastric pain.  It was described both as sharp and dull, worse with coughing.  She thought it was due to constipation and so she took laxatives which did help her symptoms.  She then noticed posterior cervical lymph node and submandibular lymph node swelling that was enlarging and not tender to palpation.  Other symptoms include a cough worse than baseline that is occasionally productive, easily fatigability, lack of energy, drenching night sweats, orthopnea, and constipation.  Yesterday, she also developed dysuria and subrapubic abdominal pain.  She denies any weight loss, chest pain, fever, chills, diarrhea, nausea , or vomiting.  She has been a long time smoker of 40+ years smoking 0.5-1 pack per day during this time.  She takes no daily medications and does not have regular medical follow up.   Of note, patient was also recently treated last month at urgent care for a presumed COPD exacerbation with azithromycin and prednisone.   In the ED, her vitals were notable for tachycardia, normal oxygen saturations on room air, normal blood pressure.  Labs were obtained as below.  Imaging also  obtained as below.  Due to her constellation of symptoms and imaging findings, IMTS was called for admission and further care.   LABS:  Lipase 29 CMET = Na 131, K 3.7, Cl 98, CO2 24, glucose 122, BUN 11, Cr 1.06, Ca 9, Albumin 3, AST/ALT 22/12, anion gap 9 CBC = WBC 7.7, Hgb 11.8, HCT 36, Platelets 203, MCV 87.6 Troponin 0.00 >> 0.00 UA not indicative of infection Lactic acid 1.67  CTA chest: 1. No demonstrable pulmonary embolus. No thoracic aortic aneurysm or dissection. Multiple foci of aortic atherosclerosis noted as well as foci of calcification in great vessels and coronary arteries. 2. Extensive adenopathy at multiple sites. This extensive adenopathy raises concern for potential lymphoma. Small cell lung carcinoma could present in this manner as well. 3. Moderate pleural effusions bilaterally, larger on the right than on the left, with compressive atelectasis in the lung bases. There is focal consolidation felt to represent pneumonia adjacent to the right heart border in the medial segment right middle lobe. 4. Partially calcified dominant mass right lobe of thyroid. This mass may warrant nonemergent thyroid ultrasound to further evaluate.  CT abdomen and pelvis: 1. Widespread abdominal and pelvic adenopathy. Splenomegaly. This combination of findings raises concern for lymphoma as most likely etiology. 2.  Mild ascites. 3. No abscess in the abdomen or pelvis. No periappendiceal region inflammation. 4. Extent extensive aortoiliac atherosclerosis with what  appears to be hemodynamically significant obstruction in the common iliac arteries bilaterally. 5.  Uterus apparently absent.  Meds:  Not on any medications.   Allergies: Allergies as of 04/17/2018  . (No Known Allergies)   History reviewed. No pertinent past medical history.  Family History: mother with hx of DM, father MI at 51, sister with breast CA  Social History: lives in [place], [place], married, 1 child,  retired from sewing, no EtOH, no drugs.  She is a 50 plus year smoker and uses caffeine.  Review of Systems: A complete ROS was negative except as per HPI.   Physical Exam: Blood pressure (!) 116/39, pulse (!) 107, temperature 97.8 F (36.6 C), temperature source Oral, resp. rate (!) 28, height 5\' 2"  (1.575 m), weight 124 lb (56.2 kg), SpO2 93 %. Physical Exam  Constitutional: She is oriented to person, place, and time.  Pleasant, appears older than stated age, mild dyspneic with conversation but not in distress.   HENT:  Head: Normocephalic and atraumatic.  Eyes: Conjunctivae and EOM are normal.  Neck: Normal range of motion. No JVD present.  She has bilateral cervical and submandibular LAD that is not tender.  She is able to swallow and speak with out difficulty.   Supraclavicular or axillary LAD also appreciated.   Cardiovascular: Regular rhythm and intact distal pulses.  Her rate is tachycardic.  Pulmonary/Chest:  Mildly dyspeic with expiratory wheezes and bilateral crackles in the bases.   Abdominal: Soft. There is tenderness. There is no rebound and no guarding.  Generalized abdominal tenderness.   Musculoskeletal: Normal range of motion. She exhibits no edema.  Lymphadenopathy:    She has cervical adenopathy.  Neurological: She is alert and oriented to person, place, and time.  Skin: Skin is warm and dry.  Psychiatric: Mood and affect normal.     EKG: personally reviewed my interpretation is sinus tachycardia.  CXR: personally reviewed my interpretation is mild interstitial opacities, possibly related to atypical infection, small bilateral effusions  Assessment & Plan by Problem: Active Problems:   Lymphoma ([place])   Pneumonia   COPD exacerbation ([place])   Tobacco use   Abdominal pain  Extensive LAD Concerning for lymphoma based on extent of LAD seen on imaging with CT studies of her chest, abdomen and pelvis.  Small cell lung cancer could present in this manner as  well.  Additionally, she has been experiencing increased fatigue and night sweats, but no fevers or weight loss.  She has no prior history of malignancy. - Will consult to general surgery for consideration of excisional biopsy for tissue diagnosis.   Pneumonia Bilateral Pleural Effusions Presumed COPD Exacerbation CT chest notable for moderate pleural effusions bilaterally (R > L) with volume loss.  Focal consolidation on the right adjacent to the right heart border is felt to represent pneumonia based on radiology read.  She has been experiencing cough recently that has been occasionally productive.  Started on CAP treatment in the ED.  She has no formal diagnosis of COPD, but her extensive tobacco history makes this a likely possibility.  On exam, she does have evidence of wheezing and some dyspnea with conversation. - Will continue CAP coverage for right sided pneumonia with CTX and azithromycin - Suspect her pleural effusions are also symptomatically contributing to her orthopnea and dyspnea.  Will see about thoracentesis for therapeutic and diagnostic benefit - Duonebs scheduled and Albuterol nebulizers PRN - Solumedrol given x 1 in the ED - Prednisone 40mg  daily starting tomorrow  Abdominal Pain Dysuria Abdominal pain ikely related to the adenopathy as noted above.  She notes dysuria but UA not suggestive of infection.  Nonetheless, ceftriaxone to treat pneumonia would cover any typical urinary pathogens. - Miralax PRN  Tobacco Use - Cessation counseling - Nicotine patch  FEN Fluids: none  Electrolytes: monitor and replace as needed Nutrition: NPO pending surgery evaluation, then Regular diet  DVT PPx: Heparin  CODE: FULL   Dispo: Admit patient to Inpatient with expected length of stay greater than 2 midnights.  SignedJule Ser, DO 04/18/2018, 12:37 PM  Pager: [phone]

## 2018-04-18 NOTE — ED Notes (Signed)
Pt updated on room on 6N along with diet of NPO for procedure. Pt understands.

## 2018-04-19 ENCOUNTER — Inpatient Hospital Stay (HOSPITAL_COMMUNITY): Payer: Self-pay

## 2018-04-19 DIAGNOSIS — R0601 Orthopnea: Secondary | ICD-10-CM

## 2018-04-19 LAB — CBC WITH DIFFERENTIAL/PLATELET
BASOS PCT: 1 %
Basophils Absolute: 0.1 10*3/uL (ref 0.0–0.1)
EOS PCT: 0 %
Eosinophils Absolute: 0 10*3/uL (ref 0.0–0.7)
HCT: 31.4 % — ABNORMAL LOW (ref 36.0–46.0)
HEMOGLOBIN: 10.5 g/dL — AB (ref 12.0–15.0)
Lymphocytes Relative: 25 %
Lymphs Abs: 2.1 10*3/uL (ref 0.7–4.0)
MCH: 29 pg (ref 26.0–34.0)
MCHC: 33.4 g/dL (ref 30.0–36.0)
MCV: 86.7 fL (ref 78.0–100.0)
Monocytes Absolute: 1.2 10*3/uL — ABNORMAL HIGH (ref 0.1–1.0)
Monocytes Relative: 15 %
NEUTROS PCT: 59 %
Neutro Abs: 4.9 10*3/uL (ref 1.7–7.7)
PLATELETS: 187 10*3/uL (ref 150–400)
RBC: 3.62 MIL/uL — ABNORMAL LOW (ref 3.87–5.11)
RDW: 15.4 % (ref 11.5–15.5)
WBC: 8.3 10*3/uL (ref 4.0–10.5)

## 2018-04-19 LAB — BASIC METABOLIC PANEL
Anion gap: 9 (ref 5–15)
BUN: 11 mg/dL (ref 6–20)
CHLORIDE: 96 mmol/L — AB (ref 101–111)
CO2: 24 mmol/L (ref 22–32)
CREATININE: 0.85 mg/dL (ref 0.44–1.00)
Calcium: 8.7 mg/dL — ABNORMAL LOW (ref 8.9–10.3)
GFR calc non Af Amer: >60 mL/min (ref >60)
Glucose, Bld: 150 mg/dL — ABNORMAL HIGH (ref 65–99)
Potassium: 3.6 mmol/L (ref 3.5–5.1)
Sodium: 129 mmol/L — ABNORMAL LOW (ref 135–145)

## 2018-04-19 LAB — HIV ANTIBODY (ROUTINE TESTING W REFLEX): HIV Screen 4th Generation wRfx: NONREACTIVE

## 2018-04-19 MED ORDER — BENZONATATE 100 MG PO CAPS
100.0000 mg | ORAL_CAPSULE | Freq: Three times a day (TID) | ORAL | Status: DC | PRN
  Administered 2018-04-19 – 2018-04-20 (×3): 100 mg via ORAL
  Filled 2018-04-19 (×3): qty 1

## 2018-04-19 MED ORDER — AZITHROMYCIN 250 MG PO TABS: 500.0000 mg | ORAL_TABLET | Freq: Every day | ORAL | Status: DC

## 2018-04-19 MED ORDER — IPRATROPIUM-ALBUTEROL 0.5-2.5 (3) MG/3ML IN SOLN: 3.0000 mL | Freq: Two times a day (BID) | RESPIRATORY_TRACT | Status: DC

## 2018-04-19 MED ORDER — IBUPROFEN 600 MG PO TABS
600.0000 mg | ORAL_TABLET | Freq: Three times a day (TID) | ORAL | Status: DC | PRN
  Administered 2018-04-19 – 2018-04-20 (×3): 600 mg via ORAL
  Filled 2018-04-19 (×3): qty 1

## 2018-04-19 MED ORDER — IPRATROPIUM-ALBUTEROL 0.5-2.5 (3) MG/3ML IN SOLN
3.0000 mL | Freq: Three times a day (TID) | RESPIRATORY_TRACT | Status: DC
  Administered 2018-04-19 – 2018-04-20 (×3): 3 mL via RESPIRATORY_TRACT
  Filled 2018-04-19 (×3): qty 3

## 2018-04-19 MED ORDER — IPRATROPIUM-ALBUTEROL 0.5-2.5 (3) MG/3ML IN SOLN
3.0000 mL | Freq: Three times a day (TID) | RESPIRATORY_TRACT | Status: DC
  Administered 2018-04-19: 3 mL via RESPIRATORY_TRACT
  Filled 2018-04-19: qty 3

## 2018-04-19 NOTE — Progress Notes (Signed)
Pt had cough on and off, with pain to back where the thoracentesis site was.  I paged the MD on call and requested something for cough and pain.  MD advised he would f/u and order something for pain and cough for the Pt.

## 2018-04-19 NOTE — Progress Notes (Signed)
   Subjective: Patient seen and evaluated today at bedside. Daughter present. In good spirits, but complains of some pain at site of thoracentesis. Admits to 42-month of orthopnea but breathing comfortable with HOB elevated. No chest pain, fevers or chills.   Objective:  Vital signs in last 24 hours: Vitals:   04/18/18 2224 04/19/18 0518 04/19/18 0936 04/19/18 1415  BP: 112/61 (!) 108/52  (!) 119/50  Pulse: (!) 104 96  (!) 110  Resp:  17  17  Temp: 98.1 F (36.7 C) 98.3 F (36.8 C)  99.1 F (37.3 C)  TempSrc: Oral Oral  Oral  SpO2: 96% 96% 94% 94%  Weight:      Height:       General: In no acute distress. Resting comfortably in bed with HOB elevated. Daughter present.  HENT: Oropharynx clear, mucous membranes moist.  Cardiovascular: Regular rate and rhythm. No murmur or rub appreciated. Pulmonary: Grossly clear, unlabored breathing on RA.  Abdomen: Soft, non-tender and non-distended. No guarding or rigidity. +bowel sounds.  Extremities: No peripheral edema noted BL. Extremities warm and perfused.  Psych: Mood normal and affect was mood congruent. Responds to questions appropriately.   Assessment/Plan:  Active Problems:   Lymphoma ([facility])   Pneumonia   COPD exacerbation ([facility])   Tobacco use   Abdominal pain   Pleural effusion  Probable Lymphoma CT with diffuse lymphadenopathy concerning for lymphadenopathy, although small cell lung CA could have a similar appearance. Gen surg to perform excisional biopsy for tissue diagnosis Monday. Tolerated thoracentesis yesterday without complication and studies consistent with an exudative effusion. Repeat 2-view CXR with improved pleural effusions and no evidence for pneumonia or pneumothorax. Patient in good spirits. Does have orthopnea due to the bulky lymphadenotomy but comfortable with HOB elevated.  -Patient to have excisional biopsy Monday, remainder of malignancy work-up to be completed outpatient -Flow cytometry pending -Discontinue  antibiotics given little evidence for pneumonia. Pleural fluid analysis with WBCs however significantly more lymphocytic than neutrophilic. In addition, gram stain without organisms, CXR is clear, she is without serum leukocytosis and is afebrile.  -Dc prednisone, but will continue duonebs for now -Heparin for DVT ppx  Dispo: Anticipated discharge Monday after biopsy.   Kinberly Perris, DO 04/19/2018, 2:40 PM Pager: [phone]

## 2018-04-20 ENCOUNTER — Encounter (HOSPITAL_COMMUNITY): Payer: Self-pay | Admitting: Anesthesiology

## 2018-04-20 DIAGNOSIS — R591 Generalized enlarged lymph nodes: Secondary | ICD-10-CM

## 2018-04-20 LAB — SURGICAL PCR SCREEN
MRSA, PCR: NEGATIVE
Staphylococcus aureus: NEGATIVE

## 2018-04-20 MED ORDER — IPRATROPIUM-ALBUTEROL 0.5-2.5 (3) MG/3ML IN SOLN
3.0000 mL | Freq: Two times a day (BID) | RESPIRATORY_TRACT | Status: DC
  Administered 2018-04-20: 3 mL via RESPIRATORY_TRACT
  Filled 2018-04-20: qty 3

## 2018-04-20 MED ORDER — POTASSIUM CHLORIDE IN NACL 20-0.9 MEQ/L-% IV SOLN
INTRAVENOUS | Status: DC
  Administered 2018-04-20: 21:00:00 via INTRAVENOUS
  Filled 2018-04-20: qty 1000

## 2018-04-20 MED ORDER — CHLORHEXIDINE GLUCONATE CLOTH 2 % EX PADS
6.0000 | MEDICATED_PAD | Freq: Every day | CUTANEOUS | Status: AC
  Administered 2018-04-20 – 2018-04-21 (×2): 6 via TOPICAL

## 2018-04-20 MED ORDER — CEFAZOLIN SODIUM-DEXTROSE 1-4 GM/50ML-% IV SOLN
1.0000 g | INTRAVENOUS | Status: AC
  Administered 2018-04-21: 1 g via INTRAVENOUS
  Filled 2018-04-20 (×2): qty 50

## 2018-04-20 NOTE — Progress Notes (Signed)
   Subjective: Patient seen and evaluated today at bedside. No complaints today, wondering what time her biopsy will be tomorrow. No chest pain, fevers or chills.   Objective:  Vital signs in last 24 hours: Vitals:   04/19/18 2058 04/20/18 0537 04/20/18 0910 04/20/18 1422  BP: (!) 111/54 (!) 115/59  119/62  Pulse: (!) 108 92  (!) 115  Resp: 16 16  16   Temp: 97.9 F (36.6 C) 97.6 F (36.4 C)  (!) 97.5 F (36.4 C)  TempSrc: Oral Oral  Oral  SpO2: 94% 97% 99% 97%  Weight:      Height:       General: In no acute distress. Resting comfortably in bed with HOB elevated. HENT: Oropharynx clear, mucous membranes moist.  Cardiovascular: Regular rate and rhythm. No murmur or rub appreciated. Pulmonary: CTA BL with good air-movement. Abdomen: Soft, non-tender and non-distended. No guarding or rigidity. +bowel sounds.  Extremities: No peripheral edema noted BL. Extremities warm and perfused.  Psych: Mood normal and affect was mood congruent. Responds to questions appropriately.   Assessment/Plan:  Active Problems:   Lymphoma ([facility])   Pneumonia   COPD exacerbation ([facility])   Tobacco use   Abdominal pain   Pleural effusion  Probable Lymphoma CT with diffuse lymphadenopathy concerning for malignancy. Gen surg to perform excisional biopsy of left axillary node for tissue diagnosis Monday. Complains of some discomfort at thoracentesis site but otherwise stable.   -Patient to have excisional biopsy Monday, remainder of malignancy work-up to be completed outpatient -Flow cytometry pending -Afebrile after discontinuing antibiotics yesterday given no sign of pneumonia on repeat CXR -Continue duonebs for now -Heparin for DVT ppx  Dispo: Anticipated discharge Monday after biopsy.   Deborah Mauney, DO 04/20/2018, 2:24 PM Pager: [phone]

## 2018-04-20 NOTE — Progress Notes (Signed)
CC: Abdominal pain and lymph node swelling/CT showing extensive adenopathy in the chest abdomen and pelvis  Subjective: Sitting up talking on the phone and looking at the window this morning.  She notes these notes of been popping up for over a week.  She still coughing some.  Seems comfortable and in no distress.  Objective: Vital signs in last 24 hours: Temp:  [97.6 F (36.4 C)-99.1 F (37.3 C)] 97.6 F (36.4 C) (06/02 0537) Pulse Rate:  [92-110] 92 (06/02 0537) Resp:  [16-17] 16 (06/02 0537) BP: (111-119)/(50-59) 115/59 (06/02 0537) SpO2:  [94 %-99 %] 99 % (06/02 0910) Last BM Date: 04/19/18 960 p.o. 100 IV Voided x5 Bowel movement recorded this a.m. She is on a regular diet Afebrile vital signs are stable T-max 99.1. No labs today, NA 129 yesterday   Intake/Output from previous day: 06/01 0701 - 06/02 0700 In: 1060 [P.O.:960; IV Piggyback:100] Out: -  Intake/Output this shift: Total I/O In: 300 [P.O.:300] Out: -   General appearance: alert, cooperative and no distress Resp: clear to auscultation bilaterally Skin: She has lymph nodes that are palpable both sides of her neck right and left axilla.  Lab Results:  Recent Labs    04/17/18 2253 04/19/18 0945  WBC 7.7 8.3  HGB 11.8* 10.5*  HCT 36.0 31.4*  PLT 203 187    BMET Recent Labs    04/17/18 2253 04/19/18 0945  NA 131* 129*  K 3.7 3.6  CL 98* 96*  CO2 24 24  GLUCOSE 122* 150*  BUN 11 11  CREATININE 1.06* 0.85  CALCIUM 9.0 8.7*   PT/INR No results for input(s): LABPROT, INR in the last 72 hours.  Recent Labs  Lab 04/17/18 2253 04/18/18 1607  AST 22  --   ALT 12*  --   ALKPHOS 57  --   BILITOT 1.2  --   PROT 7.9 7.4  ALBUMIN 3.0*  --      Lipase     Component Value Date/Time   LIPASE 29 04/17/2018 2253     Medications: . heparin  5,000 Units Subcutaneous Q8H  . ipratropium-albuterol  3 mL Nebulization TID  . nicotine  14 mg Transdermal Daily   . 0.9 % NaCl with KCl 20  mEq / L     Anti-infectives (From admission, onward)   Start     Dose/Rate Route Frequency Ordered Stop   04/20/18 1000  azithromycin (ZITHROMAX) tablet 500 mg  Status:  Discontinued     500 mg Oral Daily 04/19/18 1050 04/19/18 1520   04/19/18 1100  azithromycin (ZITHROMAX) 500 mg in sodium chloride 0.9 % 250 mL IVPB  Status:  Discontinued     500 mg 250 mL/hr over 60 Minutes Intravenous Every 24 hours 04/18/18 1155 04/19/18 1050   04/19/18 1000  cefTRIAXone (ROCEPHIN) 1 g in sodium chloride 0.9 % 100 mL IVPB  Status:  Discontinued     1 g 200 mL/hr over 30 Minutes Intravenous Every 24 hours 04/18/18 1205 04/19/18 1520   04/19/18 0000  cefTRIAXone (ROCEPHIN) 1 g in sodium chloride 0.9 % 100 mL IVPB  Status:  Discontinued     1 g 200 mL/hr over 30 Minutes Intravenous Every 24 hours 04/18/18 1155 04/18/18 1205   04/18/18 1015  cefTRIAXone (ROCEPHIN) 1 g in sodium chloride 0.9 % 100 mL IVPB     1 g 200 mL/hr over 30 Minutes Intravenous  Once 04/18/18 1004 04/18/18 1054   04/18/18 1015  azithromycin (ZITHROMAX)  500 mg in sodium chloride 0.9 % 250 mL IVPB     500 mg 250 mL/hr over 60 Minutes Intravenous  Once 04/18/18 1004 04/18/18 1155     Assessment/Plan  Tobacco abuse - 46 year smoking hx -  Pneumonia with bilateral pleural effusions - per primary service UTI - concern for cystitis on CT and urinary symptoms, per primary service  Abdominal pain - likely related to below, no peritonitis on exam Extensive Lymphadenopathy - concern for lymphoma - history of increased fatigue and night sweats - CT chest/abd/pelvis: extensive lymphadenopathy in chest/abd/pelvis with splenomegaly - patient with a few palpable lymph nodes that feel firm but mobile - L axilla, R posterior cervical  - no OR until pneumonia has improved some, would tentatively plan for early next week (possibly Monday or Tuesday, depending on OR availability) - if patient felt stable for discharge prior to then, could have  her follow up in the office  FEN: regular diet VTE: SCDs, SQ heparin ID: azithromycin/rocephin 5/31 - 04/19/18  Plan: I will make her n.p.o. after midnight, update her labs in the a.m.  We will aim for lymph node biopsy tomorrow.  I will let Dr. Kieth Brightly decide which site he would like to biopsy.       LOS: 2 days    Donnamarie Shankles 04/20/2018 [phone]

## 2018-04-20 NOTE — Anesthesia Preprocedure Evaluation (Addendum)
Anesthesia Evaluation  Patient identified by MRN, date of birth, ID band Patient awake    Reviewed: Allergy & Precautions, NPO status , Patient's Chart, lab work & pertinent test results  History of Anesthesia Complications (+) Family history of anesthesia reaction  Airway Mallampati: II  TM Distance: >3 FB Neck ROM: Full    Dental  (+) Edentulous Upper, Edentulous Lower   Pulmonary shortness of breath, with exertion and at rest, pneumonia, unresolved, COPD,  COPD inhaler, Current Smoker,  Lymphoma Bilateral pleural effusions   Pulmonary exam normal  (-) wheezing      Cardiovascular + DOE  Normal cardiovascular exam+ Valvular Problems/Murmurs  Rhythm:Regular Rate:Normal     Neuro/Psych negative neurological ROS  negative psych ROS   GI/Hepatic Neg liver ROS,   Endo/Other  negative endocrine ROS  Renal/GU negative Renal ROS  negative genitourinary   Musculoskeletal negative musculoskeletal ROS (+)   Abdominal (+)  Abdomen: soft. Bowel sounds: normal.  Peds  Hematology  (+) anemia , Left axillary lymphadenopathy   Anesthesia Other Findings   Reproductive/Obstetrics                          Anesthesia Physical Anesthesia Plan  ASA: III  Anesthesia Plan: General   Post-op Pain Management:    Induction: Intravenous  PONV Risk Score and Plan: 3 and Ondansetron, Dexamethasone and Midazolam  Airway Management Planned: LMA  Additional Equipment:   Intra-op Plan:   Post-operative Plan: Extubation in OR  Informed Consent: I have reviewed the patients History and Physical, chart, labs and discussed the procedure including the risks, benefits and alternatives for the proposed anesthesia with the patient or authorized representative who has indicated his/her understanding and acceptance.   Dental advisory given  Plan Discussed with: CRNA and Surgeon  Anesthesia Plan Comments:         Anesthesia Quick Evaluation

## 2018-04-21 ENCOUNTER — Inpatient Hospital Stay (HOSPITAL_COMMUNITY): Payer: Self-pay | Admitting: Anesthesiology

## 2018-04-21 ENCOUNTER — Encounter (HOSPITAL_COMMUNITY)
Admission: EM | Disposition: A | Discharge status: Home / Self Care | Payer: Self-pay | Source: Home / Self Care | Attending: Internal Medicine

## 2018-04-21 DIAGNOSIS — R591 Generalized enlarged lymph nodes: Secondary | ICD-10-CM

## 2018-04-21 HISTORY — PX: AXILLARY LYMPH NODE BIOPSY: SHX5737

## 2018-04-21 LAB — COMPREHENSIVE METABOLIC PANEL
ALBUMIN: 2.8 g/dL — AB (ref 3.5–5.0)
ALT: 13 U/L — ABNORMAL LOW (ref 14–54)
ANION GAP: 6 (ref 5–15)
AST: 30 U/L (ref 15–41)
Alkaline Phosphatase: 52 U/L (ref 38–126)
BUN: 10 mg/dL (ref 6–20)
CO2: 25 mmol/L (ref 22–32)
Calcium: 8.9 mg/dL (ref 8.9–10.3)
Chloride: 101 mmol/L (ref 101–111)
Creatinine, Ser: 0.9 mg/dL (ref 0.44–1.00)
GFR calc Af Amer: >60 mL/min (ref >60)
GFR calc non Af Amer: >60 mL/min (ref >60)
GLUCOSE: 135 mg/dL — AB (ref 65–99)
POTASSIUM: 4.1 mmol/L (ref 3.5–5.1)
SODIUM: 132 mmol/L — AB (ref 135–145)
Total Bilirubin: 0.9 mg/dL (ref 0.3–1.2)
Total Protein: 7.3 g/dL (ref 6.5–8.1)

## 2018-04-21 LAB — CBC
HCT: 35.6 % — ABNORMAL LOW (ref 36.0–46.0)
HEMOGLOBIN: 11.4 g/dL — AB (ref 12.0–15.0)
MCH: 28.8 pg (ref 26.0–34.0)
MCHC: 32 g/dL (ref 30.0–36.0)
MCV: 89.9 fL (ref 78.0–100.0)
Platelets: 204 10*3/uL (ref 150–400)
RBC: 3.96 MIL/uL (ref 3.87–5.11)
RDW: 15.9 % — ABNORMAL HIGH (ref 11.5–15.5)
WBC: 6.9 10*3/uL (ref 4.0–10.5)

## 2018-04-21 LAB — PROTIME-INR
INR: 1.1
Prothrombin Time: 14.1 seconds (ref 11.4–15.2)

## 2018-04-21 SURGERY — AXILLARY LYMPH NODE BIOPSY
Anesthesia: General | Site: Axilla | Laterality: Left

## 2018-04-21 SURGERY — Surgical Case
Anesthesia: *Unknown

## 2018-04-21 MED ORDER — ALBUTEROL SULFATE (2.5 MG/3ML) 0.083% IN NEBU
INHALATION_SOLUTION | RESPIRATORY_TRACT | Status: AC
  Administered 2018-04-21: 2.5 mg via RESPIRATORY_TRACT
  Filled 2018-04-21: qty 3

## 2018-04-21 MED ORDER — PROPOFOL 10 MG/ML IV BOLUS
INTRAVENOUS | Status: AC
  Filled 2018-04-21: qty 20

## 2018-04-21 MED ORDER — MIDAZOLAM HCL 2 MG/2ML IJ SOLN
INTRAMUSCULAR | Status: AC
Start: 2018-04-21 — End: ?
  Filled 2018-04-21: qty 2

## 2018-04-21 MED ORDER — ROCURONIUM BROMIDE 100 MG/10ML IV SOLN
INTRAVENOUS | Status: DC | PRN
  Administered 2018-04-21: 30 mg via INTRAVENOUS

## 2018-04-21 MED ORDER — SUGAMMADEX SODIUM 200 MG/2ML IV SOLN
INTRAVENOUS | Status: DC | PRN
  Administered 2018-04-21: 200 mg via INTRAVENOUS

## 2018-04-21 MED ORDER — PHENYLEPHRINE 40 MCG/ML (10ML) SYRINGE FOR IV PUSH (FOR BLOOD PRESSURE SUPPORT)
PREFILLED_SYRINGE | INTRAVENOUS | Status: AC
  Filled 2018-04-21: qty 10

## 2018-04-21 MED ORDER — ONDANSETRON HCL 4 MG/2ML IJ SOLN
INTRAMUSCULAR | Status: DC | PRN
  Administered 2018-04-21: 4 mg via INTRAVENOUS

## 2018-04-21 MED ORDER — 0.9 % SODIUM CHLORIDE (POUR BTL) OPTIME
TOPICAL | Status: DC | PRN
Start: 2018-04-21 — End: 2018-04-21
  Administered 2018-04-21: 1000 mL

## 2018-04-21 MED ORDER — ONDANSETRON HCL 4 MG/2ML IJ SOLN
INTRAMUSCULAR | Status: AC
  Filled 2018-04-21: qty 2

## 2018-04-21 MED ORDER — BUPIVACAINE-EPINEPHRINE (PF) 0.25% -1:200000 IJ SOLN
INTRAMUSCULAR | Status: AC
  Filled 2018-04-21: qty 30

## 2018-04-21 MED ORDER — HEPARIN SODIUM (PORCINE) 5000 UNIT/ML IJ SOLN: 5000.0000 [IU] | Freq: Three times a day (TID) | INTRAMUSCULAR | Status: DC

## 2018-04-21 MED ORDER — FENTANYL CITRATE (PF) 100 MCG/2ML IJ SOLN
INTRAMUSCULAR | Status: DC | PRN
  Administered 2018-04-21 (×2): 50 ug via INTRAVENOUS

## 2018-04-21 MED ORDER — DEXAMETHASONE SODIUM PHOSPHATE 10 MG/ML IJ SOLN
INTRAMUSCULAR | Status: AC
  Filled 2018-04-21: qty 1

## 2018-04-21 MED ORDER — SUGAMMADEX SODIUM 200 MG/2ML IV SOLN
INTRAVENOUS | Status: AC
  Filled 2018-04-21: qty 2

## 2018-04-21 MED ORDER — DEXAMETHASONE SODIUM PHOSPHATE 10 MG/ML IJ SOLN
INTRAMUSCULAR | Status: DC | PRN
  Administered 2018-04-21: 10 mg via INTRAVENOUS

## 2018-04-21 MED ORDER — MIDAZOLAM HCL 5 MG/5ML IJ SOLN
INTRAMUSCULAR | Status: DC | PRN
  Administered 2018-04-21: 2 mg via INTRAVENOUS

## 2018-04-21 MED ORDER — LIDOCAINE HCL (CARDIAC) PF 100 MG/5ML IV SOSY
PREFILLED_SYRINGE | INTRAVENOUS | Status: DC | PRN
  Administered 2018-04-21: 80 mg via INTRAVENOUS

## 2018-04-21 MED ORDER — PROPOFOL 10 MG/ML IV BOLUS
INTRAVENOUS | Status: DC | PRN
  Administered 2018-04-21: 180 mg via INTRAVENOUS

## 2018-04-21 MED ORDER — BUPIVACAINE-EPINEPHRINE 0.25% -1:200000 IJ SOLN
INTRAMUSCULAR | Status: DC | PRN
  Administered 2018-04-21: 20 mL

## 2018-04-21 MED ORDER — ALBUTEROL SULFATE (2.5 MG/3ML) 0.083% IN NEBU
2.5000 mg | INHALATION_SOLUTION | Freq: Once | RESPIRATORY_TRACT | Status: AC
  Administered 2018-04-21: 2.5 mg via RESPIRATORY_TRACT

## 2018-04-21 MED ORDER — PHENYLEPHRINE HCL 10 MG/ML IJ SOLN
INTRAMUSCULAR | Status: DC | PRN
  Administered 2018-04-21: 80 ug via INTRAVENOUS

## 2018-04-21 MED ORDER — FENTANYL CITRATE (PF) 250 MCG/5ML IJ SOLN
INTRAMUSCULAR | Status: AC
  Filled 2018-04-21: qty 5

## 2018-04-21 MED ORDER — LACTATED RINGERS IV SOLN
INTRAVENOUS | Status: DC
  Administered 2018-04-21 (×2): via INTRAVENOUS

## 2018-04-21 MED ORDER — ALBUTEROL SULFATE HFA 108 (90 BASE) MCG/ACT IN AERS
INHALATION_SPRAY | RESPIRATORY_TRACT | Status: DC | PRN
  Administered 2018-04-21: 2 via RESPIRATORY_TRACT

## 2018-04-21 SURGICAL SUPPLY — 37 items
ADH SKN CLS APL DERMABOND .7 (GAUZE/BANDAGES/DRESSINGS) ×1
BLADE SURG 15 STRL LF DISP TIS (BLADE) ×1 IMPLANT
BLADE SURG 15 STRL SS (BLADE) ×3
CHLORAPREP W/TINT 10.5 ML (MISCELLANEOUS) ×3 IMPLANT
CONT SPEC 4OZ CLIKSEAL STRL BL (MISCELLANEOUS) ×3 IMPLANT
COVER SURGICAL LIGHT HANDLE (MISCELLANEOUS) ×3 IMPLANT
DECANTER SPIKE VIAL GLASS SM (MISCELLANEOUS) ×3 IMPLANT
DERMABOND ADVANCED (GAUZE/BANDAGES/DRESSINGS) ×2
DERMABOND ADVANCED .7 DNX12 (GAUZE/BANDAGES/DRESSINGS) ×1 IMPLANT
DRAPE LAPAROTOMY 100X72 PEDS (DRAPES) ×3 IMPLANT
ELECT CAUTERY BLADE 6.4 (BLADE) ×3 IMPLANT
ELECT REM PT RETURN 9FT ADLT (ELECTROSURGICAL) ×3
ELECTRODE REM PT RTRN 9FT ADLT (ELECTROSURGICAL) ×1 IMPLANT
GAUZE SPONGE 4X4 16PLY XRAY LF (GAUZE/BANDAGES/DRESSINGS) ×3 IMPLANT
GLOVE BIOGEL PI IND STRL 7.0 (GLOVE) ×1 IMPLANT
GLOVE BIOGEL PI INDICATOR 7.0 (GLOVE) ×2
GLOVE SURG SS PI 7.0 STRL IVOR (GLOVE) ×3 IMPLANT
GOWN STRL REUS W/ TWL LRG LVL3 (GOWN DISPOSABLE) ×2 IMPLANT
GOWN STRL REUS W/TWL LRG LVL3 (GOWN DISPOSABLE) ×6
KIT BASIN OR (CUSTOM PROCEDURE TRAY) ×3 IMPLANT
KIT TURNOVER KIT B (KITS) ×3 IMPLANT
NEEDLE 22X1 1/2 (OR ONLY) (NEEDLE) ×3 IMPLANT
NEEDLE FILTER BLUNT 18X 1/2SAF (NEEDLE)
NEEDLE FILTER BLUNT 18X1 1/2 (NEEDLE) IMPLANT
NEEDLE HYPO 25GX1X1/2 BEV (NEEDLE) ×3 IMPLANT
NS IRRIG 1000ML POUR BTL (IV SOLUTION) ×3 IMPLANT
PACK SURGICAL SETUP 50X90 (CUSTOM PROCEDURE TRAY) ×3 IMPLANT
PAD ARMBOARD 7.5X6 YLW CONV (MISCELLANEOUS) ×3 IMPLANT
PENCIL BUTTON HOLSTER BLD 10FT (ELECTRODE) ×3 IMPLANT
SUT MNCRL AB 4-0 PS2 18 (SUTURE) ×3 IMPLANT
SUT SILK 3 0 (SUTURE) ×3
SUT SILK 3-0 18XBRD TIE 12 (SUTURE) ×1 IMPLANT
SUT VIC AB 3-0 SH 27 (SUTURE) ×3
SUT VIC AB 3-0 SH 27X BRD (SUTURE) ×1 IMPLANT
SYR CONTROL 10ML LL (SYRINGE) ×3 IMPLANT
TOWEL OR 17X24 6PK STRL BLUE (TOWEL DISPOSABLE) ×3 IMPLANT
TOWEL OR 17X26 10 PK STRL BLUE (TOWEL DISPOSABLE) ×3 IMPLANT

## 2018-04-21 NOTE — Transfer of Care (Addendum)
Immediate Anesthesia Transfer of Care Note  Patient: Deborah Cooley  Procedure(s) Performed: LEFT AXILLARY LYMPH NODE BIOPSY (Left Axilla)  Patient Location: PACU  Anesthesia Type:General  Level of Consciousness: sedated  Airway & Oxygen Therapy: Patient connected to face mask oxygen  Post-op Assessment: Post -op Vital signs reviewed and stable  Post vital signs: stable  Last Vitals:  Vitals Value Taken Time  BP    Temp 36.5 C 04/21/2018 10:20 AM  Pulse 111 04/21/2018 10:22 AM  Resp 20 04/21/2018 10:22 AM  SpO2 94 % 04/21/2018 10:22 AM  Vitals shown include unvalidated device data.  Last Pain:  Vitals:   04/21/18 0757  TempSrc: Oral  PainSc:       Patients Stated Pain Goal: 4 (82/51/89 8421)  Complications: No apparent anesthesia complications

## 2018-04-21 NOTE — Progress Notes (Signed)
Discharge home. Home discharge instruction given, no question verbalized. 

## 2018-04-21 NOTE — Discharge Summary (Signed)
Name: Deborah Cooley MRN: [id number] DOB: 08-03-1956 62 y.o. PCP: Patient, No Pcp Per  Date of Admission: 04/17/2018 10:30 PM Date of Discharge: 04/21/2018 Attending Physician: Lucious Groves, DO  Discharge Diagnosis:  Active Problems:   Lymphoma ([place])   Tobacco use   Pleural effusion   Diffuse lymphadenopathy   Discharge Medications: Allergies as of 04/21/2018   No Known Allergies     Medication List    You have not been prescribed any medications.     Disposition and follow-up:   [name] was discharged from [facility] in [place] condition.  At the hospital follow up visit please address:  1. Diffuse Lymphadenopathy Probable Lymphoma: - Ensure follow up with oncology - Follow up pleural fluid cytology - Follow up biopsy results  2.  Labs / imaging needed at time of follow-up: None  3.  Pending labs/ test needing follow-up: Plural fluid cytology, Surgical pathology  Follow-up Appointments:   Hospital Course by problem list: Active Problems:   Lymphoma ([place])   Tobacco use   Pleural effusion   Diffuse lymphadenopathy   Diffuse Lymphadenopathy Probable Lymphoma: Patient presented with 2 weeks ofabdominal discomfort and enlarged cervical lymph nodes. Patient also endorsed cough, fatigue, drenching night sweats, orthopnea, and constipation. CT scan showed with diffuse lymphadenopathy concerning for lymphoma vs small cell lung cancer. Patient was also noted to have bilateral pleural effusions right > Left. Right effusion was drained and sent for fluid analysis. Patient had left axillary lymph node biopsied by general surgery. Patient discharged with urgent referral to hematology/oncology.  Discharge Vitals:   BP (!) 113/57 (BP Location: Right Arm)   Pulse (!) 109   Temp 98.4 F (36.9 C) (Oral)   Resp 20   Ht 5\' 2"  (1.575 m)   Wt 124 lb (56.2 kg)   SpO2 91%   BMI 22.68 kg/m   Pertinent Labs, Studies, and Procedures:  CBC Latest  Ref Rng & Units 04/21/2018 04/19/2018 04/17/2018  WBC 4.0 - 10.5 K/uL 6.9 8.3 7.7  Hemoglobin 12.0 - 15.0 g/dL 11.4(L) 10.5(L) 11.8(L)  Hematocrit 36.0 - 46.0 % 35.6(L) 31.4(L) 36.0  Platelets 150 - 400 K/uL 204 187 203   CMP Latest Ref Rng & Units 04/21/2018 04/19/2018 04/18/2018  Glucose 65 - 99 mg/dL 135(H) 150(H) -  BUN 6 - 20 mg/dL 10 11 -  Creatinine 0.44 - 1.00 mg/dL 0.90 0.85 -  Sodium 135 - 145 mmol/L 132(L) 129(L) -  Potassium 3.5 - 5.1 mmol/L 4.1 3.6 -  Chloride 101 - 111 mmol/L 101 96(L) -  CO2 22 - 32 mmol/L 25 24 -  Calcium 8.9 - 10.3 mg/dL 8.9 8.7(L) -  Total Protein 6.5 - 8.1 g/dL 7.3 - 7.4  Total Bilirubin 0.3 - 1.2 mg/dL 0.9 - -  Alkaline Phos 38 - 126 U/L 52 - -  AST 15 - 41 U/L 30 - -  ALT 14 - 54 U/L 13(L) - -   CXR: IMPRESSION: 1. Mild interstitial opacities in the lungs are nonspecific. Edema could have this appearance but the lack of cardiomegaly would be unusual. Atypical infection is possible. 2. Small bilateral effusions. 3. The eggshell calcification in the right thoracic inlet is nonspecific. This could be a vascular structure or a calcified nodule in the thyroid.  CT Abdomen/Pelvis and CT Angio Chest PE: IMPRESSION: CT angiogram chest: 1. No demonstrable pulmonary embolus. No thoracic aortic aneurysm or dissection. Multiple foci of aortic atherosclerosis noted as well as foci of calcification  in great vessels and coronary arteries.  2. Extensive adenopathy at multiple sites as summarized above. This extensive adenopathy raises concern for potential lymphoma. Small cell lung carcinoma could present in this manner as well.  3. Moderate pleural effusions bilaterally, larger on the right than on the left, with compressive atelectasis in the lung bases. There is focal consolidation felt to represent pneumonia adjacent to the right heart border in the medial segment right middle lobe.  4. Partially calcified dominant mass right lobe of thyroid. This mass  may warrant nonemergent thyroid ultrasound to further evaluate.  CT abdomen and pelvis:  1. Widespread abdominal and pelvic adenopathy. Splenomegaly. This combination of findings raises concern for lymphoma as most likely etiology.  2.  Mild ascites.  3. No abscess in the abdomen or pelvis. No periappendiceal region inflammation.  4. Extent extensive aortoiliac atherosclerosis with what appears to be hemodynamically significant obstruction in the common iliac arteries bilaterally.  5.  Uterus apparently absent.  Discharge Instructions: Discharge Instructions    Ambulatory referral to Hematology / Oncology   Complete by:  As directed    Diffuse Lymphadenopathy, S/P Thoracentesis with Cytology Pending, Left axillary Lymph Node Biopsied, Concern for Lymphoma   Call MD for:  difficulty breathing, headache or visual disturbances   Complete by:  As directed    Call MD for:  persistant dizziness or light-headedness   Complete by:  As directed    Call MD for:  redness, tenderness, or signs of infection (pain, swelling, redness, odor or green/yellow discharge around incision site)   Complete by:  As directed    Call MD for:  severe uncontrolled pain   Complete by:  As directed    Call MD for:  temperature >100.4   Complete by:  As directed    Discharge instructions   Complete by:  As directed    Thank you for allowing [place] to care for you  Your your symptoms were due to enlarged lymph nodes. - A lymph node was biopsied to perform microscopic examination to determine the source - A referral has been placed to Hematology/Oncology -- You can contact [place] at: [phone] -- You can visit [email or web address] for more information  Please follow up with PCP when able   Increase activity slowly   Complete by:  As directed       Signed: Neva Seat, MD 04/22/2018, 9:20 PM   Pager: [phone]

## 2018-04-21 NOTE — Anesthesia Procedure Notes (Signed)
Procedure Name: LMA Insertion Date/Time: 04/21/2018 9:30 AM Performed by: Lavell Luster, CRNA Pre-anesthesia Checklist: Patient identified, Emergency Drugs available, Suction available, Patient being monitored and Timeout performed Patient Re-evaluated:Patient Re-evaluated prior to induction Oxygen Delivery Method: Circle system utilized Preoxygenation: Pre-oxygenation with 100% oxygen Induction Type: IV induction Ventilation: Mask ventilation without difficulty LMA: LMA inserted LMA Size: 4.0 Number of attempts: 1 Placement Confirmation: ETT inserted through vocal cords under direct vision,  positive ETCO2 and breath sounds checked- equal and bilateral Tube secured with: Tape Dental Injury: Teeth and Oropharynx as per pre-operative assessment

## 2018-04-21 NOTE — Social Work (Addendum)
CSW acknowledging consult for pt being uninsured.  CSW contacted [employer] [facility] to see if they would be able to meet with pt.   CSW will continue to follow, await response from Financial Counseling.   12:26pm- Financial Counselor Briseyda Fehr responded, pt is on their case load to see for Medicaid support and assistance with insurance coverage.   CSW signing off. Please consult if any additional needs arise.  Alexander Mt, [place] [facility] [phone]

## 2018-04-21 NOTE — Progress Notes (Signed)
   Subjective: Patient seen resting in her bed. She endorses mild dyspnea, but states she continues to feel improved from when she came in. She states she is ready to go home and she was informed that she could continue her workup as an outpatient. She will be provided an urgent referral to Heme/Onc. She has no other complaints this morning.  Objective:  Vital signs in last 24 hours: Vitals:   04/21/18 1037 04/21/18 1051 04/21/18 1112 04/21/18 1300  BP: (!) 101/45 (!) 105/35 (!) 97/52 (!) 113/57  Pulse: (!) 116 (!) 113 (!) 110 (!) 109  Resp: (!) 23 (!) 25 20   Temp:    98.4 F (36.9 C)  TempSrc:    Oral  SpO2: 100% 96% 97% 91%  Weight:      Height:       Physical Exam  Constitutional: She is oriented to person, place, and time. She appears well-developed and well-nourished. No distress.  HENT:  Head: Normocephalic and atraumatic.  Cardiovascular: Normal rate, regular rhythm and normal heart sounds.  Pulmonary/Chest: Effort normal. No respiratory distress.  Decreased breath sounds bilateral bases  Abdominal: Soft. Bowel sounds are normal. She exhibits no distension. There is no tenderness.  Musculoskeletal: She exhibits no edema or deformity.  Neurological: She is alert and oriented to person, place, and time.  Skin: Skin is warm and dry.   Assessment/Plan:  Active Problems:   Lymphoma ([facility])   Pneumonia   COPD exacerbation ([facility])   Tobacco use   Abdominal pain   Pleural effusion  Probable Lymphoma: CT with diffuse lymphadenopathy concerning for malignancy.  > L axillary LN Biopsy today 6/3.  > Malignancy work-up to be completed outpatient > Flow cytometry pending > Has rmeained Afebrile after discontinuing antibiotics - HEME/ONC referral - Continue duonebs for now - Heparin for DVT ppx  Dispo: Anticipated discharge today.   Neva Seat, MD 04/21/2018, 1:51 PM Pager: [phone]

## 2018-04-21 NOTE — Anesthesia Postprocedure Evaluation (Signed)
Anesthesia Post Note  Patient: Deborah Cooley  Procedure(s) Performed: LEFT AXILLARY LYMPH NODE BIOPSY (Left Axilla)     Patient location during evaluation: PACU Anesthesia Type: General Level of consciousness: awake and alert and oriented Pain management: pain level controlled Vital Signs Assessment: post-procedure vital signs reviewed and stable Respiratory status: spontaneous breathing, nonlabored ventilation, respiratory function stable and patient connected to nasal cannula oxygen Cardiovascular status: blood pressure returned to baseline and stable Postop Assessment: no apparent nausea or vomiting Anesthetic complications: no    Last Vitals:  Vitals:   04/21/18 1051 04/21/18 1112  BP: (!) 105/35 (!) 97/52  Pulse: (!) 113 (!) 110  Resp: (!) 25 20  Temp:    SpO2: 96% 97%    Last Pain:  Vitals:   04/21/18 1112  TempSrc:   PainSc: 0-No pain                 [name]

## 2018-04-21 NOTE — Plan of Care (Signed)
  Problem: Education: Goal: Knowledge of General Education information will improve Outcome: Progressing   Problem: RH PAIN MANAGEMENT Goal: Knowledge of Patient Safety will improve Outcome: Progressing

## 2018-04-21 NOTE — Op Note (Signed)
Preoperative diagnosis: lymphadenopathy  Postoperative diagnosis: same   Procedure: excision of 3cm left axillary lymph node  Surgeon: Gurney Maxin, M.D.  Asst: Kerby Moors, [place] student  Anesthesia: general  Indications for procedure: Deborah Cooley is a 62 y.o. year old female with symptoms of lymphadenopathy and findings concerning for lymphoma. After discussion plan was made for tissue diagnosis via excision left axilla lymph node biopsy.  Description of procedure: The patient was brought into the operative suite. Anesthesia was administered with General LMA anesthesia. WHO checklist was applied. The patient was then placed in supine position. The area was prepped and draped in the usual sterile fashion.  66ml marcaine was injected into the skin in the left axilla. A small incision was made in the left axilla.  Cautery was used to dissect down through subcutaneous tissues and previous palpated axillary lymph node was identified.  Cautery and blunt dissection was used to dissect surrounding tissue from it on all sides.  Clamps were used for the major blood vessel going to the lymph node.  Lymph node was then removed.  3-0 silk ties were used to ligate blood supply going to the lymph node.  Wound was irrigated no further bleeding was identified.  Wound was then closed with 3-0 Vicryl in the deep space as well as deep dermal space in interrupted fashion.  4-0 Monocryl was used in running fashion to close subcuticular layer Dermabond was put in place for dressing.  Patient awoke from anesthesia.  Findings: Enlarged left axillary lymph node  Specimen: Left axillary lymph node  Implant: None  Blood loss: Less than 10 mL  Local anesthesia: 20 ml marcaine   Complications: None  Gurney Maxin, M.D. General, Bariatric, & Minimally Invasive Surgery [facility]

## 2018-04-21 NOTE — Progress Notes (Signed)
Pre Procedure note for inpatients:   Deborah Cooley has been scheduled for Procedure(s): LEFT AXILLARY LYMPH NODE BIOPSY (Left) today. The various methods of treatment have been discussed with the patient. After consideration of the risks, benefits and treatment options the patient has consented to the planned procedure.   The patient has been seen and labs reviewed. There are no changes in the patient's condition to prevent proceeding with the planned procedure today.  Recent labs:  Lab Results  Component Value Date   WBC 6.9 04/21/2018   HGB 11.4 (L) 04/21/2018   HCT 35.6 (L) 04/21/2018   PLT 204 04/21/2018   GLUCOSE 135 (H) 04/21/2018   ALT 13 (L) 04/21/2018   AST 30 04/21/2018   NA 132 (L) 04/21/2018   K 4.1 04/21/2018   CL 101 04/21/2018   CREATININE 0.90 04/21/2018   BUN 10 04/21/2018   CO2 25 04/21/2018   INR 1.10 04/21/2018    Mickeal Skinner, MD 04/21/2018 8:58 AM

## 2018-04-22 ENCOUNTER — Encounter (HOSPITAL_COMMUNITY): Payer: Self-pay | Admitting: General Surgery

## 2018-04-22 ENCOUNTER — Encounter (HOSPITAL_COMMUNITY): Payer: Self-pay | Admitting: Emergency Medicine

## 2018-04-22 LAB — BODY FLUID CULTURE: Culture: NO GROWTH

## 2018-04-23 ENCOUNTER — Emergency Department (HOSPITAL_COMMUNITY): Payer: Self-pay

## 2018-04-23 ENCOUNTER — Encounter (HOSPITAL_COMMUNITY): Payer: Self-pay

## 2018-04-23 ENCOUNTER — Inpatient Hospital Stay (HOSPITAL_COMMUNITY)
Admission: EM | Admit: 2018-04-23 | Discharge: 2018-05-19 | DRG: 823 | Disposition: A | Discharge status: Home / Self Care | Payer: Self-pay | Attending: Internal Medicine | Admitting: Internal Medicine

## 2018-04-23 DIAGNOSIS — J9601 Acute respiratory failure with hypoxia: Secondary | ICD-10-CM | POA: Diagnosis present

## 2018-04-23 DIAGNOSIS — E8770 Fluid overload, unspecified: Secondary | ICD-10-CM | POA: Diagnosis present

## 2018-04-23 DIAGNOSIS — J90 Pleural effusion, not elsewhere classified: Secondary | ICD-10-CM | POA: Diagnosis present

## 2018-04-23 DIAGNOSIS — T380X5A Adverse effect of glucocorticoids and synthetic analogues, initial encounter: Secondary | ICD-10-CM | POA: Diagnosis not present

## 2018-04-23 DIAGNOSIS — R591 Generalized enlarged lymph nodes: Secondary | ICD-10-CM | POA: Diagnosis present

## 2018-04-23 DIAGNOSIS — D6959 Other secondary thrombocytopenia: Secondary | ICD-10-CM | POA: Diagnosis present

## 2018-04-23 DIAGNOSIS — E871 Hypo-osmolality and hyponatremia: Secondary | ICD-10-CM

## 2018-04-23 DIAGNOSIS — D630 Anemia in neoplastic disease: Secondary | ICD-10-CM

## 2018-04-23 DIAGNOSIS — D696 Thrombocytopenia, unspecified: Secondary | ICD-10-CM

## 2018-04-23 DIAGNOSIS — Z720 Tobacco use: Secondary | ICD-10-CM | POA: Diagnosis present

## 2018-04-23 DIAGNOSIS — N179 Acute kidney failure, unspecified: Secondary | ICD-10-CM | POA: Diagnosis present

## 2018-04-23 DIAGNOSIS — J910 Malignant pleural effusion: Secondary | ICD-10-CM | POA: Diagnosis present

## 2018-04-23 DIAGNOSIS — C8440 Peripheral T-cell lymphoma, not classified, unspecified site: Secondary | ICD-10-CM | POA: Diagnosis present

## 2018-04-23 DIAGNOSIS — E038 Other specified hypothyroidism: Secondary | ICD-10-CM

## 2018-04-23 DIAGNOSIS — R0602 Shortness of breath: Secondary | ICD-10-CM

## 2018-04-23 DIAGNOSIS — E876 Hypokalemia: Secondary | ICD-10-CM | POA: Diagnosis present

## 2018-04-23 DIAGNOSIS — A419 Sepsis, unspecified organism: Secondary | ICD-10-CM | POA: Diagnosis not present

## 2018-04-23 DIAGNOSIS — J690 Pneumonitis due to inhalation of food and vomit: Secondary | ICD-10-CM | POA: Diagnosis present

## 2018-04-23 DIAGNOSIS — D61810 Antineoplastic chemotherapy induced pancytopenia: Secondary | ICD-10-CM | POA: Diagnosis present

## 2018-04-23 DIAGNOSIS — M940 Chondrocostal junction syndrome [Tietze]: Secondary | ICD-10-CM

## 2018-04-23 DIAGNOSIS — R739 Hyperglycemia, unspecified: Secondary | ICD-10-CM | POA: Diagnosis not present

## 2018-04-23 DIAGNOSIS — G9341 Metabolic encephalopathy: Secondary | ICD-10-CM | POA: Diagnosis present

## 2018-04-23 DIAGNOSIS — I10 Essential (primary) hypertension: Secondary | ICD-10-CM | POA: Diagnosis present

## 2018-04-23 DIAGNOSIS — R161 Splenomegaly, not elsewhere classified: Secondary | ICD-10-CM | POA: Diagnosis present

## 2018-04-23 DIAGNOSIS — R6521 Severe sepsis with septic shock: Secondary | ICD-10-CM | POA: Diagnosis not present

## 2018-04-23 DIAGNOSIS — E782 Mixed hyperlipidemia: Secondary | ICD-10-CM | POA: Diagnosis present

## 2018-04-23 DIAGNOSIS — C865 Angioimmunoblastic T-cell lymphoma: Principal | ICD-10-CM | POA: Diagnosis present

## 2018-04-23 DIAGNOSIS — R0600 Dyspnea, unspecified: Secondary | ICD-10-CM

## 2018-04-23 DIAGNOSIS — E8809 Other disorders of plasma-protein metabolism, not elsewhere classified: Secondary | ICD-10-CM | POA: Diagnosis present

## 2018-04-23 DIAGNOSIS — R1310 Dysphagia, unspecified: Secondary | ICD-10-CM | POA: Diagnosis present

## 2018-04-23 DIAGNOSIS — Z6823 Body mass index (BMI) 23.0-23.9, adult: Secondary | ICD-10-CM

## 2018-04-23 DIAGNOSIS — J440 Chronic obstructive pulmonary disease with acute lower respiratory infection: Secondary | ICD-10-CM | POA: Diagnosis present

## 2018-04-23 DIAGNOSIS — E039 Hypothyroidism, unspecified: Secondary | ICD-10-CM | POA: Diagnosis present

## 2018-04-23 DIAGNOSIS — R601 Generalized edema: Secondary | ICD-10-CM

## 2018-04-23 DIAGNOSIS — E872 Acidosis: Secondary | ICD-10-CM | POA: Diagnosis present

## 2018-04-23 DIAGNOSIS — J189 Pneumonia, unspecified organism: Secondary | ICD-10-CM

## 2018-04-23 DIAGNOSIS — F17210 Nicotine dependence, cigarettes, uncomplicated: Secondary | ICD-10-CM | POA: Diagnosis present

## 2018-04-23 DIAGNOSIS — J9600 Acute respiratory failure, unspecified whether with hypoxia or hypercapnia: Secondary | ICD-10-CM

## 2018-04-23 DIAGNOSIS — Z5111 Encounter for antineoplastic chemotherapy: Secondary | ICD-10-CM

## 2018-04-23 DIAGNOSIS — C8590 Non-Hodgkin lymphoma, unspecified, unspecified site: Secondary | ICD-10-CM

## 2018-04-23 DIAGNOSIS — E46 Unspecified protein-calorie malnutrition: Secondary | ICD-10-CM | POA: Diagnosis present

## 2018-04-23 DIAGNOSIS — J9602 Acute respiratory failure with hypercapnia: Secondary | ICD-10-CM | POA: Diagnosis present

## 2018-04-23 DIAGNOSIS — Z9189 Other specified personal risk factors, not elsewhere classified: Secondary | ICD-10-CM

## 2018-04-23 DIAGNOSIS — I82B11 Acute embolism and thrombosis of right subclavian vein: Secondary | ICD-10-CM | POA: Diagnosis not present

## 2018-04-23 DIAGNOSIS — R062 Wheezing: Secondary | ICD-10-CM

## 2018-04-23 DIAGNOSIS — R0902 Hypoxemia: Secondary | ICD-10-CM

## 2018-04-23 DIAGNOSIS — Z9889 Other specified postprocedural states: Secondary | ICD-10-CM

## 2018-04-23 DIAGNOSIS — D702 Other drug-induced agranulocytosis: Secondary | ICD-10-CM

## 2018-04-23 LAB — BASIC METABOLIC PANEL
Anion gap: 8 (ref 5–15)
BUN: 14 mg/dL (ref 6–20)
CALCIUM: 9 mg/dL (ref 8.9–10.3)
CO2: 26 mmol/L (ref 22–32)
CREATININE: 0.87 mg/dL (ref 0.44–1.00)
Chloride: 101 mmol/L (ref 101–111)
GFR calc Af Amer: >60 mL/min (ref >60)
GLUCOSE: 131 mg/dL — AB (ref 65–99)
Potassium: 4.4 mmol/L (ref 3.5–5.1)
SODIUM: 135 mmol/L (ref 135–145)

## 2018-04-23 LAB — CBC
HCT: 33.3 % — ABNORMAL LOW (ref 36.0–46.0)
Hemoglobin: 10.9 g/dL — ABNORMAL LOW (ref 12.0–15.0)
MCH: 29.4 pg (ref 26.0–34.0)
MCHC: 32.7 g/dL (ref 30.0–36.0)
MCV: 89.8 fL (ref 78.0–100.0)
PLATELETS: 178 10*3/uL (ref 150–400)
RBC: 3.71 MIL/uL — ABNORMAL LOW (ref 3.87–5.11)
RDW: 16.1 % — AB (ref 11.5–15.5)
WBC: 7.7 10*3/uL (ref 4.0–10.5)

## 2018-04-23 LAB — I-STAT CG4 LACTIC ACID, ED: Lactic Acid, Venous: 2.43 mmol/L (ref 0.5–1.9)

## 2018-04-23 LAB — BRAIN NATRIURETIC PEPTIDE: B NATRIURETIC PEPTIDE 5: 77.8 pg/mL (ref 0.0–100.0)

## 2018-04-23 LAB — I-STAT TROPONIN, ED: TROPONIN I, POC: 0 ng/mL (ref 0.00–0.08)

## 2018-04-23 NOTE — ED Triage Notes (Signed)
Pt reports that she was recently admitted, left on Mon and has been SOB since, worse today, swelling to lower legs, stomach and chest. Pt reports productive clear cough.

## 2018-04-24 ENCOUNTER — Other Ambulatory Visit: Payer: Self-pay

## 2018-04-24 LAB — I-STAT CG4 LACTIC ACID, ED: LACTIC ACID, VENOUS: 1.69 mmol/L (ref 0.5–1.9)

## 2018-04-24 MED ORDER — ENOXAPARIN SODIUM 40 MG/0.4ML ~~LOC~~ SOLN
40.0000 mg | SUBCUTANEOUS | Status: DC
  Administered 2018-04-24 – 2018-05-02 (×9): 40 mg via SUBCUTANEOUS
  Filled 2018-04-24 (×9): qty 0.4

## 2018-04-24 MED ORDER — ENSURE ENLIVE PO LIQD
237.0000 mL | Freq: Two times a day (BID) | ORAL | Status: DC
  Administered 2018-04-24 – 2018-04-25 (×3): 237 mL via ORAL

## 2018-04-24 MED ORDER — AZITHROMYCIN 500 MG PO TABS
500.0000 mg | ORAL_TABLET | Freq: Every day | ORAL | Status: AC
  Administered 2018-04-24: 500 mg via ORAL
  Filled 2018-04-24: qty 1

## 2018-04-24 MED ORDER — AMOXICILLIN-POT CLAVULANATE 500-125 MG PO TABS
1.0000 | ORAL_TABLET | Freq: Two times a day (BID) | ORAL | Status: DC
  Administered 2018-04-24 (×2): 500 mg via ORAL
  Filled 2018-04-24 (×2): qty 1

## 2018-04-24 MED ORDER — SODIUM CHLORIDE 0.9 % IV BOLUS
1000.0000 mL | Freq: Once | INTRAVENOUS | Status: AC
  Administered 2018-04-24: 1000 mL via INTRAVENOUS

## 2018-04-24 MED ORDER — ACETAMINOPHEN 325 MG PO TABS
650.0000 mg | ORAL_TABLET | Freq: Four times a day (QID) | ORAL | Status: DC | PRN
  Administered 2018-04-25 – 2018-05-13 (×8): 650 mg via ORAL
  Filled 2018-04-24 (×8): qty 2

## 2018-04-24 MED ORDER — IPRATROPIUM-ALBUTEROL 0.5-2.5 (3) MG/3ML IN SOLN: 3.0000 mL | Freq: Four times a day (QID) | RESPIRATORY_TRACT | Status: DC | PRN

## 2018-04-24 MED ORDER — POLYETHYLENE GLYCOL 3350 17 G PO PACK: 17.0000 g | PACK | Freq: Every day | ORAL | Status: DC | PRN

## 2018-04-24 MED ORDER — IPRATROPIUM-ALBUTEROL 0.5-2.5 (3) MG/3ML IN SOLN
3.0000 mL | Freq: Four times a day (QID) | RESPIRATORY_TRACT | Status: DC
  Administered 2018-04-24 (×2): 3 mL via RESPIRATORY_TRACT
  Filled 2018-04-24 (×2): qty 3

## 2018-04-24 MED ORDER — SODIUM CHLORIDE 0.9% FLUSH
3.0000 mL | Freq: Two times a day (BID) | INTRAVENOUS | Status: DC
  Administered 2018-04-24 – 2018-05-16 (×31): 3 mL via INTRAVENOUS

## 2018-04-24 MED ORDER — IPRATROPIUM-ALBUTEROL 0.5-2.5 (3) MG/3ML IN SOLN: 3.0000 mL | Freq: Once | RESPIRATORY_TRACT | Status: DC

## 2018-04-24 MED ORDER — ONDANSETRON HCL 4 MG/2ML IJ SOLN: 4.0000 mg | Freq: Four times a day (QID) | INTRAMUSCULAR | Status: DC | PRN

## 2018-04-24 MED ORDER — ONDANSETRON HCL 4 MG PO TABS
4.0000 mg | ORAL_TABLET | Freq: Four times a day (QID) | ORAL | Status: DC | PRN
  Filled 2018-04-24: qty 1

## 2018-04-24 MED ORDER — RAMELTEON 8 MG PO TABS
8.0000 mg | ORAL_TABLET | Freq: Every day | ORAL | Status: DC
  Administered 2018-04-24 – 2018-04-30 (×7): 8 mg via ORAL
  Filled 2018-04-24 (×7): qty 1

## 2018-04-24 MED ORDER — AZITHROMYCIN 500 MG PO TABS
250.0000 mg | ORAL_TABLET | Freq: Every day | ORAL | Status: AC
  Administered 2018-04-25 – 2018-04-28 (×4): 250 mg via ORAL
  Filled 2018-04-24 (×4): qty 1

## 2018-04-24 MED ORDER — ACETAMINOPHEN 650 MG RE SUPP
650.0000 mg | Freq: Four times a day (QID) | RECTAL | Status: DC | PRN
  Filled 2018-04-24: qty 1

## 2018-04-24 MED ORDER — ALBUTEROL SULFATE (2.5 MG/3ML) 0.083% IN NEBU: 2.5000 mg | INHALATION_SOLUTION | RESPIRATORY_TRACT | Status: DC | PRN

## 2018-04-24 NOTE — Progress Notes (Signed)
   Subjective: Deborah Cooley was seen resting in her bed this morning. She state she has shortness of breath with exertion and some intermittent shortness of breath at rest. She states it has not worsened since discharge. She also endorses some abdominal fullness and states her last bowl movement was yesterday. She states she has experienced some Lower extremity swelling which is new. No fevers.  Objective:  Vital signs in last 24 hours: Vitals:   04/24/18 0415 04/24/18 0443 04/24/18 0513 04/24/18 0659  BP: (!) 112/57 (!) 110/55    Pulse: 98 (!) 102    Resp: 17 20    Temp:  97.6 F (36.4 C)    TempSrc:  Oral    SpO2: 96% 96% 96%   Weight:  140 lb 10.5 oz (63.8 kg)    Height:    5\' 2"  (1.575 m)   Physical Exam  Constitutional: She appears well-developed and well-nourished.  HENT:  Head: Normocephalic and atraumatic.  Eyes: EOM are normal. Right eye exhibits no discharge. Left eye exhibits no discharge.  Cardiovascular: Regular rhythm, normal heart sounds and intact distal pulses.  Tachycardic  Pulmonary/Chest: Effort normal and breath sounds normal. No respiratory distress.  Absent breath sounds bilateral bases  Abdominal: Soft. Bowel sounds are normal. There is no tenderness.  Moderate abdominal distention  Musculoskeletal:  Trace LE edema  Neurological: She is alert.  Skin: Skin is warm and dry.   Assessment/Plan: 62 yo with PMH of HTN, endometriosis s/p hysterectomy, tobacco use and recent hospitalization for multifocal lymphadenopathy and concern for lymphoma who presented for ongoing shortness of breath which has worsened since discharge 3 days prior.   Bilateral pleural effusions Lymphadenopathy, concerning for lymphoma: Ongoing subjective SOB, prsent since discharge on 6/3. Patient's CXR with persistent bilateral pleural effusions. Significant abdominal distension noted on exam, which she stated seems to limit her inspiration. Some relief of dyspnea with thoracentesis last  admission] > Thoracentesis cytology inconclusive (T-cell predominance, not abnormal by flow cytometry). > No ascites noted on POCUS > SW consulted for financial assistance - Will work to arrange oncology follow up and establish patient with [facility] - Effusions remain small (though enlarged from previous on R), will not pursue thoracentesis at this time - L axillary node biopsy results pending  Right middle lobe opacity: Noted to be worsening on CXR. Will treat for possible obstructive PNA in the setting of persistent dyspnea (though this is also 2/2 effusions as above and patient remains afebrile without leukocytosis). - Augmentin and Azithromycin (Day 1 of 5)  - PRN DuoNebs  Hx of hypertension: Patient currently normotensive. Per chart review history of hypertension, but no current outpatient medications need. - Continue to monitor  Calcified mass in R lobe of thyroid: Incidental CT finding, again noted on admission CXR. > Per CT Read: May warrant follow up ultrasound for further evaluation   FEN: Regular Diet VTE Prophylaxis: Lovenox daily Code Status: Full  Dispo: Anticipated discharge in approximately 1-2 day(s).   Neva Seat, MD 04/24/2018, 7:06 AM Pager: [phone]

## 2018-04-24 NOTE — H&P (Signed)
Date: 04/24/2018               Patient Name:  Deborah Cooley MRN: [id number]  DOB: 1956/06/18 Age / Sex: 62 y.o., female   PCP: Patient, No Pcp Per         Medical Service: Internal Medicine Teaching Service         Attending Physician: Dr. Lucious Groves, DO    First Contact: Dr. Pearson Grippe Pager: [phone]  Second Contact: Dr. Einar Gip Pager: [phone]       After Hours (After 5p/  First Contact Pager: [phone]  weekends / holidays): Second Contact Pager: [phone]   Chief Complaint: Shortness of breath, abdominal distension  History of Present Illness:  Deborah Cooley 62 yo with PMH of HTN, endometriosis s/p hysterectomy, tobacco use and recent hospitalization for multifocal lymphadenopathy and concern for lymphoma who presents for evaluation of recurrent shortness of breath and abdominal pain. History taken directly from the patient. Patient states that since discharge from hospital 3 days ago she has felt ongoing shortness of breath and swelling/pain in abdomen. Patient states both of these symptoms were present during previous hospitalization. She had some mild relief of symptoms with procedure done to drain fluid from her lungs while in hospital, but symptoms quickly returned once she got home. Since the day of discharge patient has experienced difficulty ambulating 2/2 shortness of breath and difficulty sleeping. Patient notes symptoms worse when laying flat and it feels as if she will drown/choke on her fluid at night. Because of this patient hasn't really slept since leaving the hospital. Patient thinks that her abdominal swelling noted last admission has gotten worse and she now has pain, described as increased fullness/pressure, in her upper abdomen whenever she takes in a deep breath. No fevers, difficulties with urination, bowel movements, headaches, or nausea/vomiting.   Upon arrival to the ED patient was afebrile, intermittently tachycardic, normotensive, and  saturating >94% on room air. Patient's CBC with Hgb 10.9, WBC 7.7, platelets 178. BMP within normal limits. BNP = 78. ISTAT lactic acid elevated to 2.43, repeat 1.69. EKG with sinus tachycardia, but no acute changes concerning for ACS. ISTAT troponin within normal limits. Patient had CXR which showed increased bilateral pleural effusions, diffuse interstitial prominence, patchy airspace opacity in RML, and LLL atelectasis. Patient had ongoing SOB in ED and IMTS called for admission.   Meds:  No current outpatient medications.  Allergies: Allergies as of 04/23/2018  . (No Known Allergies)   Past Medical History: Past Medical History:  Diagnosis Date  . Bilateral pleural effusion 04/17/2018  . Endometriosis   . Essential hypertension, benign   . Family history of adverse reaction to anesthesia    "sister stops breathing I think" (04/18/2018)  . Heart murmur   . Mixed hyperlipidemia   . Pneumonia 04/17/2018   Family History:  Family History  Problem Relation Age of Onset  . Diabetes Mother        age 52  . Heart attack Father        died age 5's  . Hypertension Father   . Cancer Sister        breast cancer diagonsed age 41 years  . Diabetes Unknown   . Heart disease Unknown        Female <55  . Arthritis Unknown   . Asthma Unknown   . Hyperlipidemia Other        several siblings  . Thyroid disease Sister  one sister with thyroid disease   Social History:  Lives with husband, dog. Retired and previously worked as Research scientist (life sciences). Current 1 pack per day smoker, since age 45. No alcohol or illicit drugs.  Review of Systems: A complete ROS was negative except as per HPI.   Physical Exam: Blood pressure 126/64, pulse 97, resp. rate 20, SpO2 99 %.  Physical Exam  Constitutional: She is oriented to person, place, and time.  Appears older than stated age sitting comfortably on ED stretcher in no acute distress.  HENT:  Mouth/Throat: Oropharynx is clear  and moist. No oropharyngeal exudate.  R pre auricular lymphadenopathy  Cardiovascular: Normal rate, regular rhythm and intact distal pulses. Exam reveals no friction rub.  No murmur heard. Respiratory:  Not speaking in full sentences. No accessory muscle use or nasal flaring. Breathing through pursed lips. Shallow breaths. Decreased breath sounds in bilateral lung bases (R>L), no other crackles or wheezing appreciated.  GI: Soft. Bowel sounds are normal. She exhibits distension (abdomen taut). There is no tenderness. There is no rebound.  Musculoskeletal: She exhibits no edema (of bilateral lower extremities) or tenderness (of bilateral lower extremities).  Lymphadenopathy:    She has cervical adenopathy (scattered bilateral).  Neurological: She is alert and oriented to person, place, and time.  Face strength and sensation intact bilaterally. Tongue midline. Gross motor and sensation to light touch of upper and lower extremities intact bilaterally.  Skin: Skin is warm and dry. No rash noted. No erythema.   EKG: personally reviewed my interpretation is sinus tachycardia with diffuse T wave flattening. NO ST elevation to suggest acute ischemia.   CXR: personally reviewed my interpretation is bilateral pleural effusions with increased interstitial opacities bilaterally. NO cardiomegaly but slight hyperinflation of lungs bilaterally  Assessment & Plan by Problem: Active Problems:   Lymphoma ([place])  Gillie Manners 62 yo with PMH of HTN, endometriosis s/p hysterectomy, tobacco use and recent hospitalization for multifocal lymphadenopathy and concern for lymphoma who presented for ongoing shortness of breath which has worsened since discharge 3 days prior. She was admitted to the internal medicine teaching service for management. The specific problems addressed during admission are as follows:  Lymphadenopathy, concerning for lymphoma: Patient presenting with ongoing subjective SOB which have been  present since discharge on 04/21/2018. Patient's CXR with bilateral pleural effusions and decreased breath sounds in bilateral lung bases (R>L). Patient also with significant abdominal distension on exam which prohibits patient from taking in deep breaths. Patient reported some relief with thoracentesis on last admission, but relief short lived and minimal. Patient will likely need repeat thoracentesis and plan for long-term management of pleural/abdominal fluid accumulation that is contributing to shortness of breath.   -Admit to med-surg -Consider IR consult for thoracentesis if primary team unable -Scheduled DuoNebs -F/u biopsy taken on 04/21/2018 with pathology -Consider oncology consult while inpatient given recurrence of symptoms within short period of time and poor access to care (discussed below)  Calcified mass in R lobe of thyroid: Incidentally noted on CT angiography of the chest during workup of shortness of breath on previous admission and again noted on admission chest x ray. Patient will require outpatient follow up for further evaluation but this was not noted in summary on discharge. Patient's discharge plan from 6/3 notes to follow up with PCP, however patient without plans/means to follow up appropriately (patient cites no insurance and no resources to find PCP provided during hospitalization as factors that limit her ability to follow up after  discharge). Discussed follow up in internal medicine clinic for management of ongoing complex medical problems and patient amenable. Please arrange for this prior to D/C. -Arrange follow up in [facility] clinic on discharge -SW consult for financial assistance (patient requested) -Thyroid ultrasound on follow up, possible FNA   Hx of hypertension: Patient currently normotensive. Per chart review history of hypertension, but no current outpatient medications need. -Continue to monitor  FEN/GI: -Regular diet -No IVF, replace electrolytes as  needed  VTE Prophylaxis: Lovenox daily Code Status: Full  Dispo: Admit patient to Observation with expected length of stay less than 2 midnights.  SignedThomasene Ripple, MD 04/24/2018, 3:23 AM  Pager: [phone]

## 2018-04-24 NOTE — ED Notes (Signed)
Admitting at bedside 

## 2018-04-24 NOTE — ED Provider Notes (Addendum)
[facility] EMERGENCY DEPARTMENT Provider Note   CSN: [id number] Arrival date & time: 04/23/18  2001     History   Chief Complaint Chief Complaint  Patient presents with  . Shortness of Breath    HPI Deborah Cooley is a 62 y.o. female.  HPI   Deborah Cooley is a 62yo female with a history of tobacco use, hypertension, hyperlipidemia and possible lymphoma who presents to the emergency department for evaluation of shortness of breath.  Patient was recently hospitalized and discharged 2 days ago in which she was found to have CT chest/abdomen/pelvis with evidence of diffuse lymphadenopathy concerning for lymphoma vs small cell lung cancer. She had bilateral pleural effusions and right was drained via thoracentesis. She also had left axillary lymph node biopsied by general surgery. Told to follow up with hematology/oncology.  Patient reports that since discharge she has had worsening shortness of breath, particularly upon exertion.  She states that even walking short distances in the house from couch to bathroom makes her short of breath.  Also reporting cough with productive clear mucus for the past 3 weeks, although seems to be worse now than before.  She has had some wheezing as well.  She reports anterior chest wall tenderness with cough only.  Denies fever, chills, congestion, sore throat, abdominal pain, dysuria, lightheadedness, syncope.   Past Medical History:  Diagnosis Date  . Bilateral pleural effusion 04/17/2018  . Endometriosis   . Essential hypertension, benign   . Family history of adverse reaction to anesthesia    "sister stops breathing I think" (04/18/2018)  . Heart murmur   . Mixed hyperlipidemia   . Pneumonia 04/17/2018    Patient Active Problem List   Diagnosis Date Noted  . Diffuse lymphadenopathy 04/21/2018  . Lymphoma ([place]) 04/18/2018  . Tobacco use 04/18/2018  . Pleural effusion   . Essential hypertension, benign 05/21/2011  . Tobacco  abuse 07/12/2009  . Atypical chest pain 07/12/2009    Past Surgical History:  Procedure Laterality Date  . ABDOMINAL HYSTERECTOMY  1970s/1980s   for endometriosis  . Arthroscopic right shoulder surgery    . AXILLARY LYMPH NODE BIOPSY Left 04/21/2018   Procedure: LEFT AXILLARY LYMPH NODE BIOPSY;  Surgeon: Kieth Brightly, Arta Bruce, MD;  Location: [facility];  Service: General;  Laterality: Left;  . CARPAL TUNNEL RELEASE Right   . GANGLION CYST EXCISION Right   . MULTIPLE TOOTH EXTRACTIONS    . OOPHORECTOMY  1997  . PARTIAL HYSTERECTOMY  1984  . Right hand surgery    . SHOULDER OPEN ROTATOR CUFF REPAIR Right   . VESICOVAGINAL FISTULA CLOSURE W/ TAH       OB History   None      Home Medications    Prior to Admission medications   Not on File    Family History Family History  Problem Relation Age of Onset  . Diabetes Mother        age 43  . Heart attack Father        died age 12's  . Hypertension Father   . Cancer Sister        breast cancer diagonsed age 12 years  . Diabetes Unknown   . Heart disease Unknown        Female <55  . Arthritis Unknown   . Asthma Unknown   . Hyperlipidemia Other        several siblings  . Thyroid disease Sister        one sister  with thyroid disease    Social History Social History   Tobacco Use  . Smoking status: Current Every Day Smoker    Packs/day: 1.50    Years: 46.00    Pack years: 69.00    Types: Cigarettes  . Smokeless tobacco: Never Used  Substance Use Topics  . Alcohol use: Not Currently    Comment: 04/18/2018 "quit years ago"  . Drug use: Not Currently    Comment: "back in my 45s"     Allergies   Patient has no known allergies.   Review of Systems Review of Systems  Constitutional: Negative for fever.  HENT: Negative for congestion and sore throat.   Eyes: Negative for visual disturbance.  Respiratory: Positive for shortness of breath and wheezing.   Cardiovascular: Positive for chest pain (with cough only).    Gastrointestinal: Negative for abdominal pain.  Genitourinary: Negative for difficulty urinating, dysuria and frequency.  Musculoskeletal: Negative for back pain.  Skin: Negative for color change.  Neurological: Negative for syncope and light-headedness.  Psychiatric/Behavioral: Negative for agitation.     Physical Exam Updated Vital Signs BP (!) 113/59   Pulse 97   Resp (!) 30   SpO2 95%   Physical Exam  Constitutional: She is oriented to person, place, and time. She appears well-developed and well-nourished. No distress.  No acute distress, non-toxic appearing.   HENT:  Head: Normocephalic and atraumatic.  Mouth/Throat: Oropharynx is clear and moist. No oropharyngeal exudate.  Eyes: Pupils are equal, round, and reactive to light. Conjunctivae are normal. Right eye exhibits no discharge. Left eye exhibits no discharge.  Neck: Normal range of motion. Neck supple.  Cardiovascular: Normal rate, regular rhythm and intact distal pulses.  Pulmonary/Chest: Effort normal. No respiratory distress.  No respiratory distress, speaking in full sentences. No wheezes, stridor or rales.   Abdominal: Soft. There is no tenderness.  Musculoskeletal: Normal range of motion.  Neurological: She is alert and oriented to person, place, and time. Coordination normal.  Skin: Skin is warm and dry. Capillary refill takes less than 2 seconds. She is not diaphoretic.  Psychiatric: She has a normal mood and affect. Her behavior is normal.  Nursing note and vitals reviewed.    ED Treatments / Results  Labs (all labs ordered are listed, but only abnormal results are displayed) Labs Reviewed  CBC - Abnormal; Notable for the following components:      Result Value   RBC 3.71 (*)    Hemoglobin 10.9 (*)    HCT 33.3 (*)    RDW 16.1 (*)    All other components within normal limits  BASIC METABOLIC PANEL - Abnormal; Notable for the following components:   Glucose, Bld 131 (*)    All other components  within normal limits  I-STAT CG4 LACTIC ACID, ED - Abnormal; Notable for the following components:   Lactic Acid, Venous 2.43 (*)    All other components within normal limits  BRAIN NATRIURETIC PEPTIDE  I-STAT TROPONIN, ED  I-STAT CG4 LACTIC ACID, ED    EKG None  Radiology Dg Chest 2 View  Result Date: 04/23/2018 CLINICAL DATA:  Recent thoracentesis, worsening shortness of breath would like swelling. Productive cough. History of pleural effusion, pneumonia, left axillary node biopsy April 21, 2018. EXAM: CHEST - 2 VIEW COMPARISON:  Chest radiograph April 19, 2018 and CT chest Apr 18, 2018 FINDINGS: Increasing small bilateral pleural effusions. Diffuse interstitial prominence. Increased lung volumes and flattened hemidiaphragms. Worsening patchy right middle lobe airspace opacity. Left lung  base atelectasis. Cardiac silhouette mildly enlarged. Calcified aortic arch. No pneumothorax. Calcified right thyroid nodule. Soft tissue planes and included osseous structures are not suspicious. IMPRESSION: Increasing small bilateral pleural effusions. Worsening right middle lobe airspace opacity with left lung base atelectasis. Mild cardiomegaly.  COPD. Aortic Atherosclerosis (ICD10-I70.0). Electronically Signed   By: Elon Alas M.D.   On: 04/23/2018 21:15    Procedures Procedures (including critical care time)  Medications Ordered in ED Medications  ipratropium-albuterol (DUONEB) 0.5-2.5 (3) MG/3ML nebulizer solution 3 mL (has no administration in time range)  sodium chloride 0.9 % bolus 1,000 mL (1,000 mLs Intravenous New Bag/Given 04/24/18 0123)     Initial Impression / Assessment and Plan / ED Course  I have reviewed the triage vital signs and the nursing notes.  Pertinent labs & imaging results that were available during my care of the patient were reviewed by me and considered in my medical decision making (see chart for details).     Chest x-ray reveals increasing size of bilateral  pleural effusions and worsening right middle lobe opacity.  She continues to be tachypneic and tachycardic in the ED.  Ambulated in the hallway with O2 sat remaining at 97%, although she felt very short of breath and breathing appeared to be very labored.  Placed on 2 L O2 nasal cannula.  BNP within normal limits, no appreciable pitting edema or signs of fluid overload in the legs on exam. Creatinine WNL. Do not suspect CHF or renal disease contributing.  CBC without leukocytosis.  She did have a mild lactic acidosis on arrival of 2.43, this improved with fluids.  BMP without any major lecture light abnormalities.  Troponin negative, EKG nonischemic, chest pain is only occurring with cough and do not suspect ACS at this time.  Patient will be admitted to the internal medicine residency service for shortness of breath secondary to bilateral pleural effusions.  Patient has remained afebrile in the ED. Did not start antibiotic for questionable right middle lobe pneumonia found on CXR, will leave this up to the internal medicine residency service. Patient and her family members at bedside informed about plan to admit and agree with this plan. Discussed this patient with Dr. Stark Jock who agrees with plan.  Final Clinical Impressions(s) / ED Diagnoses   Final diagnoses:  None    ED Discharge Orders    None       Bernarda Caffey 04/24/18 [id number]    Veryl Speak, MD 04/24/18 0618    Glyn Ade, PA-C 05/01/18 1731    Veryl Speak, MD 05/02/18 [phone]

## 2018-04-24 NOTE — ED Notes (Signed)
1st and 2nd set of blood cultures sent to lab

## 2018-04-24 NOTE — ED Notes (Signed)
ED Provider at bedside. 

## 2018-04-24 NOTE — Plan of Care (Signed)
Neuro: Pt A&O X4 and able to follow all commands at this time. Neuro exam WNL. Will continue to monitor.    Respiratory: Pt remains on RA with no SOB or DOE at this time. Pt with fine crackles in right lower lobe. Pt given IS and demonstrated proper use.    Cardiovascular:  Pts BP WNL and pt remains afebrile.   GI/GU: Pt voiding in bathroom with adequate urine output. Pt tolerating PO intake with good PO appetite and tolerating all meds and meals at this time.     Skin: Skin intact with no s/s of skin breakdown at this time. Pt able to reposition independently and sat most of the day in chair.   Pain: Pt with no reports of pain.    Events: NO acute events throughout shift. Pts plan of care to continue with current regimen, Family updated and no further questions at this time.

## 2018-04-24 NOTE — ED Notes (Addendum)
PT. Ambulated to nurses station with steady gait. SPO2 maintained 97%. Pt. Noted to be labor breathing when sitting back in the bed. Pt. Reported feeling shob.

## 2018-04-25 LAB — CBC WITH DIFFERENTIAL/PLATELET
Basophils Absolute: 0 10*3/uL (ref 0.0–0.1)
Basophils Relative: 0 %
EOS PCT: 0 %
Eosinophils Absolute: 0 10*3/uL (ref 0.0–0.7)
HCT: 31 % — ABNORMAL LOW (ref 36.0–46.0)
Hemoglobin: 10.2 g/dL — ABNORMAL LOW (ref 12.0–15.0)
LYMPHS PCT: 28 %
Lymphs Abs: 2 10*3/uL (ref 0.7–4.0)
MCH: 30.6 pg (ref 26.0–34.0)
MCHC: 32.9 g/dL (ref 30.0–36.0)
MCV: 93.1 fL (ref 78.0–100.0)
MONOS PCT: 11 %
Monocytes Absolute: 0.8 10*3/uL (ref 0.1–1.0)
Neutro Abs: 4.5 10*3/uL (ref 1.7–7.7)
Neutrophils Relative %: 61 %
Platelets: 138 10*3/uL — ABNORMAL LOW (ref 150–400)
RBC: 3.33 MIL/uL — AB (ref 3.87–5.11)
RDW: 18.6 % — ABNORMAL HIGH (ref 11.5–15.5)
WBC: 7.3 10*3/uL (ref 4.0–10.5)

## 2018-04-25 LAB — COMPREHENSIVE METABOLIC PANEL
ALT: 11 U/L — ABNORMAL LOW (ref 14–54)
AST: 23 U/L (ref 15–41)
Albumin: 2.6 g/dL — ABNORMAL LOW (ref 3.5–5.0)
Alkaline Phosphatase: 50 U/L (ref 38–126)
Anion gap: 9 (ref 5–15)
BUN: 15 mg/dL (ref 6–20)
CHLORIDE: 102 mmol/L (ref 101–111)
CO2: 24 mmol/L (ref 22–32)
Calcium: 8.8 mg/dL — ABNORMAL LOW (ref 8.9–10.3)
Creatinine, Ser: 0.95 mg/dL (ref 0.44–1.00)
Glucose, Bld: 127 mg/dL — ABNORMAL HIGH (ref 65–99)
POTASSIUM: 4.5 mmol/L (ref 3.5–5.1)
Sodium: 135 mmol/L (ref 135–145)
Total Bilirubin: 0.8 mg/dL (ref 0.3–1.2)
Total Protein: 7.3 g/dL (ref 6.5–8.1)

## 2018-04-25 LAB — PATHOLOGIST SMEAR REVIEW

## 2018-04-25 LAB — TSH: TSH: 9.837 u[IU]/mL — AB (ref 0.350–4.500)

## 2018-04-25 MED ORDER — IPRATROPIUM-ALBUTEROL 0.5-2.5 (3) MG/3ML IN SOLN
3.0000 mL | Freq: Three times a day (TID) | RESPIRATORY_TRACT | Status: DC
  Administered 2018-04-26 – 2018-04-29 (×10): 3 mL via RESPIRATORY_TRACT
  Filled 2018-04-25 (×6): qty 3
  Filled 2018-04-25: qty 6
  Filled 2018-04-25 (×3): qty 3

## 2018-04-25 MED ORDER — BOOST / RESOURCE BREEZE PO LIQD CUSTOM
1.0000 | Freq: Three times a day (TID) | ORAL | Status: DC
  Administered 2018-04-25 – 2018-04-26 (×4): 1 via ORAL
  Administered 2018-04-27: 23:00:00 via ORAL
  Administered 2018-04-27 – 2018-05-10 (×24): 1 via ORAL
  Filled 2018-04-25 (×3): qty 1

## 2018-04-25 MED ORDER — ALBUTEROL SULFATE (2.5 MG/3ML) 0.083% IN NEBU
2.5000 mg | INHALATION_SOLUTION | RESPIRATORY_TRACT | Status: DC | PRN
  Administered 2018-04-28 – 2018-05-02 (×3): 2.5 mg via RESPIRATORY_TRACT
  Filled 2018-04-25 (×3): qty 3

## 2018-04-25 MED ORDER — IPRATROPIUM-ALBUTEROL 0.5-2.5 (3) MG/3ML IN SOLN
3.0000 mL | Freq: Four times a day (QID) | RESPIRATORY_TRACT | Status: DC
  Administered 2018-04-25 (×2): 3 mL via RESPIRATORY_TRACT
  Filled 2018-04-25 (×2): qty 3

## 2018-04-25 MED ORDER — AMOXICILLIN-POT CLAVULANATE 875-125 MG PO TABS
1.0000 | ORAL_TABLET | Freq: Two times a day (BID) | ORAL | Status: AC
  Administered 2018-04-25 – 2018-04-28 (×8): 1 via ORAL
  Filled 2018-04-25 (×8): qty 1

## 2018-04-25 MED ORDER — IPRATROPIUM-ALBUTEROL 0.5-2.5 (3) MG/3ML IN SOLN
3.0000 mL | Freq: Four times a day (QID) | RESPIRATORY_TRACT | Status: DC
  Administered 2018-04-25: 3 mL via RESPIRATORY_TRACT
  Filled 2018-04-25: qty 3

## 2018-04-25 NOTE — Progress Notes (Signed)
Subjective: Deborah Cooley was seen resting in her bed this morning. She continues to experience shortness of breath with increased wheezing (relative to yesterday). Stable abdominal fullness/discomfort, continues to have bowl movements. Lower extremity swelling stable as well. Patient's Husband and friend at beside today and were updated on the patient's workup so far. They were informed that we continue to await the results of her lymph node biopsy; her right pleural effusion has enlarged somewhat from her discharge, but is not a great target to be drained yet; there is no fluid in her abdomen; and we do not want to be aggressive about any fluid removal as she is not overloaded at this point and we do not want to cause any additional issues.  Objective:  Vital signs in last 24 hours: Vitals:   04/24/18 0659 04/24/18 1534 04/24/18 2126 04/25/18 0513  BP:  (!) 119/57 (!) 129/57 (!) 111/38  Pulse:  (!) 109 98 97  Resp:  (!) 24 (!) 21 20  Temp:  98.2 F (36.8 C) (!) 97.5 F (36.4 C) 97.6 F (36.4 C)  TempSrc:  Oral Oral Oral  SpO2:  92% 94% 96%  Weight:      Height: 5\' 2"  (1.575 m)      Physical Exam  Constitutional: She appears well-developed and well-nourished.  HENT:  Head: Normocephalic and atraumatic.  Diffuse cervical, axillary, and pre-auriclar lymphadenopathy  Cardiovascular: Regular rhythm, normal heart sounds and intact distal pulses.  Tachycardic  Pulmonary/Chest: Effort normal. No respiratory distress.  Absent breath sounds bilateral bases End expiratory wheezing Rhonchi  Abdominal: Soft. Bowel sounds are normal. She exhibits distension. There is no tenderness.  Musculoskeletal:  Mild pedal edema  Neurological: She is alert.  Skin: Skin is warm and dry.   Assessment/Plan: 62 yo with PMH of HTN, endometriosis s/p hysterectomy, tobacco use and recent hospitalization for multifocal lymphadenopathy and concern for lymphoma who presented for ongoing shortness of breath  which has worsened since discharge 3 days prior.   Bilateral pleural effusions Lymphadenopathy, concerning for lymphoma: Admission CXR with persistent bilateral pleural effusions, small interval increase on right. Persistent Abdominal distension on exam. No ascites noted on POCUS on 6/6 or 6/7. > Thoracentesis cytology: inconclusive (T-cell predominance, not abnormal by flow cytometry). > SW consulted for financial assistance - Working to arrange oncology follow up and establish patient with [facility] - Effusions remain small (though enlarged from previous on R), will not pursue thoracentesis at this time - L axillary node biopsy results pending (results expected today)  Dyspnea Right middle lobe opacity: Stable dyspnea since discharge 6/3. Likely Multifactorial: Small bilateral effusions; RML opacity (?PNA); Large thoracic lymph node burden; ?Undaignosed COPD (given smoking history). > Opacity worsening on CXR 6/6. Treating for possible PNA in the setting of persistent dyspnea and worsening opcity (although atypical presentation) > Increasing wheezing with PRN Duonebs, will schedule > Saturating well on room air - Augmentin and Azithromycin (Day 1 of 5)  - DuoNebs q6h - PRN Albuterol  Abdominal Distention: Unclear etiology. No ascites on POCUS, though splenomegaly is present; ?Discomfort from effusions. Continues to have regular bowl movements. - Continue to monitor  Pedal Edema: Possibly 2/2 low albumin, 2.6 this AM. - Elevate lower extremities  Poor Oral intake: Decreased oral intake due to discomfort from enlarged lymph nodes. - Nutrition consult - Ensure BID between meals  Hx of hypertension: BP currently soft to normotensive. History of hypertension, not on medications. - Continue to monitor  Calcified mass in R lobe of  thyroid: Incidental CT finding, again noted on admission CXR. > Per CT Read: Consider non-emergent follow up ultrasound for further evaluation   FEN: Regular  Diet VTE Prophylaxis: Lovenox daily Code Status: Full  Dispo: Anticipated discharge in approximately 1-2 day(s).   Deborah Seat, MD 04/25/2018, 6:56 AM Pager: [phone]

## 2018-04-25 NOTE — Progress Notes (Signed)
Initial Nutrition Assessment  DOCUMENTATION CODES:   Not applicable  INTERVENTION:    Boost Breeze po TID, each supplement provides 250 kcal and 9 grams of protein  If PO intake does not improve consider nutrition support via 10 F Cortrak feeding tube  NUTRITION DIAGNOSIS:   Inadequate oral intake related to early satiety(lymph node swelling) as evidenced by meal completion < 25%  GOAL:   Patient will meet greater than or equal to 90% of their needs  MONITOR:   PO intake, Supplement acceptance, Labs, Weight trends, Skin, I & O's  REASON FOR ASSESSMENT:   Consult Assessment of nutrition requirement/status, Poor PO  ASSESSMENT:   62 yo Female with PMH of HTN, endometriosis s/p hysterectomy, tobacco use, who recently admitted with multifocal lymphadenopathy and concerns for lymphoma, she presents with shortness of breath and difficulty sleeping. Symptoms become worse at night. She continues to exhibit lymphadenopathy concerning for lymphoma upon this admission, also has a calcified mass in R lobe of thyroid.  RD spoke with pt at bedside. Husband sleeping. Daughter present. Pt reports she's experiencing early satiety after a few bites of food.  She also shares her lymph nodes are swollen. Meals are always cold.  Daughter states pt was not eating well approximately 1 week PTA. Doesn't like Ensure Enlive, however, amenable to trying [employer]. Denies recent unintentional weight loss.  Labs and medications reviewed. Lymph node biopsy pending. UBW is 134 lbs.  NUTRITION - FOCUSED PHYSICAL EXAM:  Deferred at this time.  Diet Order:   Diet Order           Diet regular Room service appropriate? Yes; Fluid consistency: Thin  Diet effective now        EDUCATION NEEDS:   No education needs have been identified at this time  Skin:  Skin Assessment: Reviewed RN Assessment(Ecchymosis to R Arm, generalized abdomen)  Last BM:  6/6  Height:   Ht Readings from Last 1  Encounters:  04/24/18 5\' 2"  (1.575 m)   Weight:   Wt Readings from Last 1 Encounters:  04/25/18 135 lb 5.8 oz (61.4 kg)   Ideal Body Weight:  50 kg  BMI:  Body mass index is 24.76 kg/m.  Estimated Nutritional Needs:   Kcal:  1600-1800  Protein:  80-95 gm  Fluid:  1.6-1.8 L  Arthur Holms, RD, LDN Pager #: [phone] After-Hours Pager #: [phone]

## 2018-04-25 NOTE — Care Management Note (Signed)
Case Management Note  Patient Details  Name: Deborah Cooley MRN: [id number] Date of Birth: 1956-03-25  Subjective/Objective:          Patient from home w spouse, Baird, uninsured, no PCP. Made f/[facility] apt at [facility] clinic, entered on AVS. Will need MATCH at DC as she cannotgo to their low cost pharmacy until after she is seen there. Will continue to follow.           Action/Plan:   Expected Discharge Date:                  Expected Discharge Plan:     In-House Referral:     Discharge planning Services  CM Consult, Follow-up appt scheduled  Post Acute Care Choice:    Choice offered to:     DME Arranged:    DME Agency:     HH Arranged:    HH Agency:     Status of Service:  In process, will continue to follow  If discussed at Long Length of Stay Meetings, dates discussed:    Additional Comments:  Carles Collet, RN 04/25/2018, 3:10 PM

## 2018-04-26 DIAGNOSIS — R0600 Dyspnea, unspecified: Secondary | ICD-10-CM

## 2018-04-26 LAB — COMPREHENSIVE METABOLIC PANEL
ALT: 11 U/L — ABNORMAL LOW (ref 14–54)
ANION GAP: 10 (ref 5–15)
AST: 26 U/L (ref 15–41)
Albumin: 2.6 g/dL — ABNORMAL LOW (ref 3.5–5.0)
Alkaline Phosphatase: 52 U/L (ref 38–126)
BUN: 12 mg/dL (ref 6–20)
CALCIUM: 8.5 mg/dL — AB (ref 8.9–10.3)
CHLORIDE: 100 mmol/L — AB (ref 101–111)
CO2: 21 mmol/L — AB (ref 22–32)
Creatinine, Ser: 0.91 mg/dL (ref 0.44–1.00)
GFR calc non Af Amer: >60 mL/min (ref >60)
GLUCOSE: 132 mg/dL — AB (ref 65–99)
POTASSIUM: 4.1 mmol/L (ref 3.5–5.1)
SODIUM: 131 mmol/L — AB (ref 135–145)
Total Bilirubin: 0.8 mg/dL (ref 0.3–1.2)
Total Protein: 7.5 g/dL (ref 6.5–8.1)

## 2018-04-26 LAB — T4, FREE: FREE T4: 0.99 ng/dL (ref 0.82–1.77)

## 2018-04-26 NOTE — Progress Notes (Addendum)
Physical Therapy Evaluation Patient Details Name: Deborah Cooley MRN: [id number] DOB: [birth year] Today's Date: 04/26/2018    History of Present Illness Pt is a 62 y.o. F with significant PMH of HTN, endometriosis s/p hysterectomy, tobaccos use and recent hospitalization for multifocal lymphadenopathy and concern for lymphoma who presents for eavluation of recurrent shortness of breath and abdominal pain.    PT Comments    Pt admitted with above diagnosis. Pt currently with functional limitations due to the deficits listed below (see PT Problem List). Presenting with decreased functional mobility compared to baseline secondary to diminished endurance, balance and abdominal pain. Ambulating 70 feet with no assistive device and min guard. Maintaining 94% SpO2 on RA throughout. Pt will benefit from skilled PT to increase their independence and safety with mobility to allow discharge to the venue listed below.     Follow Up Recommendations  Home health PT;Supervision for mobility/OOB     Equipment Recommendations  None recommended by PT    Recommendations for Other Services       Precautions / Restrictions Precautions Precautions: Fall Restrictions Weight Bearing Restrictions: No    Mobility  Bed Mobility Overal bed mobility: Needs Assistance Bed Mobility: Supine to Sit     Supine to sit: Min assist     General bed mobility comments: patient husband provided HHA for patient to progress from supine to sit  Transfers Overall transfer level: Needs assistance Equipment used: None Transfers: Sit to/from Stand Sit to Stand: Supervision         General transfer comment: supervision for safety  Ambulation/Gait Ambulation/Gait assistance: Min guard Ambulation Distance (Feet): 70 Feet Assistive device: None Gait Pattern/deviations: Decreased stride length;Narrow base of support;Decreased step length - left Gait velocity: decreased Gait velocity interpretation: <1.31 ft/sec,  indicative of household ambulator General Gait Details: patient with mild unsteadiness throughout gait requiring min guard.    Stairs             Wheelchair Mobility    Modified Rankin (Stroke Patients Only)       Balance Overall balance assessment: Needs assistance Sitting-balance support: No upper extremity supported;Feet supported Sitting balance-Leahy Scale: Good       Standing balance-Leahy Scale: Fair Standing balance comment: able to statically stand with feet apart without support but cannot tolerate challenge without loss of balance Single Leg Stance - Right Leg: 0       Rhomberg - Eyes Opened: 0                  Cognition Arousal/Alertness: Awake/alert Behavior During Therapy: WFL for tasks assessed/performed Overall Cognitive Status: Within Functional Limits for tasks assessed                                        Exercises      General Comments        Pertinent Vitals/Pain Pain Assessment: Faces Faces Pain Scale: Hurts even more Pain Location: low abdomen Pain Descriptors / Indicators: Discomfort Pain Intervention(s): Monitored during session    Home Living Family/patient expects to be discharged to:: Private residence Living Arrangements: Spouse/significant other Available Help at Discharge: Family Type of Home: Mobile home Home Access: Stairs to enter Entrance Stairs-Rails: Can reach both Home Layout: One level Home Equipment: None      Prior Function Level of Independence: Independent      Comments: still drives, community ambulatory    PT  Goals (current goals can now be found in the care plan section) Acute Rehab PT Goals Patient Stated Goal: no pain PT Goal Formulation: With patient Time For Goal Achievement: 05/03/18 Potential to Achieve Goals: Good    Frequency    Min 3X/week      PT Plan      Co-evaluation              AM-PAC PT "6 Clicks" Daily Activity  Outcome Measure   Difficulty turning over in bed (including adjusting bedclothes, sheets and blankets)?: None Difficulty moving from lying on back to sitting on the side of the bed? : A Little Difficulty sitting down on and standing up from a chair with arms (e.g., wheelchair, bedside commode, etc,.)?: A Little Help needed moving to and from a bed to chair (including a wheelchair)?: A Little Help needed walking in hospital room?: A Little Help needed climbing 3-5 steps with a railing? : A Little 6 Click Score: 19    End of Session Equipment Utilized During Treatment: Gait belt Activity Tolerance: Patient limited by fatigue Patient left: in chair;with call bell/phone within reach;with family/visitor present   PT Visit Diagnosis: Unsteadiness on feet (R26.81);Difficulty in walking, not elsewhere classified (R26.2)     Time: 8250-0370 PT Time Calculation (min) (ACUTE ONLY): 16 min  Charges:                       G Codes:      Ellamae Sia, PT, DPT Acute Rehabilitation Services  Pager: [phone]    Willy Eddy 04/26/2018, 2:32 PM

## 2018-04-26 NOTE — Discharge Summary (Signed)
TRANSFER TO [facility]  Name: Deborah Cooley MRN: [id number] DOB: January 27, 1956 62 y.o. PCP: Patient, No Pcp Per  Date of Admission: 04/23/2018  8:11 PM Date of Transfer: 05/06/2018 Attending Physician: Aldine Contes, MD  Discharge Diagnosis:  1. Angioimmunoblastic T-cell Lymphoma 2. Hyponatremia 3. Anemia 4. Thrombocytopenia 5. Pleural Effusion 6. Subclinical hypothyroidism  Transfer Medications: Current Facility-Administered Medications (Endocrine & Metabolic):  .  dexamethasone (DECADRON) tablet 20 mg .  insulin aspart (novoLOG) injection 0-5 Units .  insulin aspart (novoLOG) injection 0-9 Units  Current Facility-Administered Medications (Respiratory):  .  albuterol (PROVENTIL) (2.5 MG/3ML) 0.083% nebulizer solution 2.5 mg .  diphenhydrAMINE (BENADRYL) capsule 50 mg .  guaiFENesin-dextromethorphan (ROBITUSSIN DM) 100-10 MG/5ML syrup 5 mL  Current Facility-Administered Medications (Analgesics):  .  acetaminophen (TYLENOL) tablet 650 mg **OR** acetaminophen (TYLENOL) suppository 650 mg .  allopurinol (ZYLOPRIM) tablet 300 mg  Current Facility-Administered Medications (Other):  .  0.9 %  sodium chloride infusion (Manually program via Guardrails IV Fluids) .  0.9 %  sodium chloride infusion (Manually program via Guardrails IV Fluids) .  0.9 %  sodium chloride infusion .  diclofenac sodium (VOLTAREN) 1 % transdermal gel 2 g .  feeding supplement (BOOST / RESOURCE BREEZE) liquid 1 Container .  ondansetron (ZOFRAN) tablet 4 mg **OR** ondansetron (ZOFRAN) injection 4 mg .  polyethylene glycol (MIRALAX / GLYCOLAX) packet 17 g .  ramelteon (ROZEREM) tablet 8 mg .  sodium chloride flush (NS) 0.9 % injection 3 mL  No current outpatient medications on file.   Disposition and follow-up:   Deborah Cooley was teansfered from [facility] to [place] in [place] condition.  At the hospital follow up visit please address:  Angioimmunoblastic T-cell  Lymphoma: - Treatment per oncology team  Other management per Hospitalist service  Patient will need ultrasound follow up of thyroid mass  Patient can [facility] for PCP follow up on discharge if she wishes  Follow-up Appointments: Follow-up Information    [facility], [facility]. Go on 04/28/2019.   Why:  at [place] your ID, proof of income so you can be placed on sliding scale, list of medications. Contact information: [place] [address] [phone]           Hospital Course by problem list:   Poor Oral intake Abdominal Distention Lower Extremity Edema Bilateral pleural effusions AngioImmunoblastic T-cell lymphoma: Patient presented with ongoing subjective dyspnea and cough, which had been present since discharge from previous admission on 6/3. Patient's admission CXR showed persistent bilateral pleural effusions, with small interval increase on right, deemed not large enough for thoracentesis. Patient has persistent abdominal distension noted on exam, which she stated seems to limit her inspiration. No ascites noted on POCUS on 6/6 or 6/7; though splenomegaly noted. Patient oxygenated well on room air at rest throughout her hospitalization, but required some oxygen following activity (primarily for comfort based on reported O2 sats). Thoracentesis cytology from previous admission was inconclusive showing T-cell predominance, not abnormal by flow cytometry. L axillary node biopsy results confirmed suspected Angioimmunoblastic t-cell lymphoma. Patient was started on high dose steroids during the days prior to transfer and began to have improvement in her symptoms. She was also started on continuous IVF and allopurinol for prophylaxis against tumor lysis syndrome upon initiation of chemotherapy. She has been transferred to [facility] per Oncology team for Port-a-cath placement and initiation of Chemotherapy with  CHOP C1 with G-CSF.   Dyspnea:  Patient presented with stable dyspnea as above. She saturated well on room air throughout her admission. Her dyspnea has likely been multifactorial as she has ongoing small bilateral effusions; a right middle lobe opacity (concerning for possible pneumonia, as below); a large thoracic lymph node burden; and possible undaignosed COPD (as she has a 46 pack year smoking history. She was treated with scheduled Duonebs which were weaned off to PRN albuterol. She received a course of antibiotics (as below); and IV lasix. Her dyspnea remained largely stable thorughout her admission with some improvement in the day or two prior to transfer.  Right middle lobe opacity: Opacity worsening on CXR 6/6. Treated for possible PNA in the setting of persistent dyspnea and worsening opcity (although atypical presentation). Completed 5 days of Augmentin and Azithromycin on 6/10.  Hyponatremia: Patient with downtrending Na early in admission (135 >> 128). Patient noted to have volume overload with LE edema and 5-10lbs of weight gain during admission. She was treated with IV Lasix and sodium continued to downtrend. Lasix were discontinued. Sodium trended up following intiation of steroid therapy despite starting IVF.  Subclinical hypothyroidism Calcified mass in R lobe of thyroid: Incidental CT finding, again noted on admission CXR. Per CT Read: non-emergent follow up ultrasound for further evaluation. TSH elevated to 9.837. Free T4 lower end of normal at 0.99.  Thrombocytopenia, Anemia: Likely secondary to bone marrow involvement. Lovenox held in this setting with reported red streak in sputum. Hgb trend:  8.7>>8.4>>7.7. Oncology recommended transfusion goal of >9 prior to chemotherapy, to be transfused at [facility] to prevent delay of transfer.  Discharge Vitals:   BP (!) 103/45 (BP Location: Right Arm)   Pulse 97   Temp 97.6 F (36.4 C) (Oral)   Resp 18   Ht 5\' 2"  (1.575 m)   Wt 143  lb 11.8 oz (65.2 kg)   SpO2 96%   BMI 26.29 kg/m   Pertinent Labs, Studies, and Procedures:  CBC Latest Ref Rng & Units 05/06/2018 05/05/2018 05/04/2018  WBC 4.0 - 10.5 K/uL 5.7 5.7 6.7  Hemoglobin 12.0 - 15.0 g/dL 7.7(L) 7.7(L) 8.4(L)  Hematocrit 36.0 - 46.0 % 22.7(L) 22.7(L) 24.4(L)  Platelets 150 - 400 K/uL 118(L) 89(L) 68(L)   CMP Latest Ref Rng & Units 05/06/2018 05/05/2018 05/04/2018  Glucose 65 - 99 mg/dL 189(H) 201(H) 173(H)  BUN 6 - 20 mg/dL 29(H) 30(H) 34(H)  Creatinine 0.44 - 1.00 mg/dL 0.72 0.86 1.12(H)  Sodium 135 - 145 mmol/L 129(L) 126(L) 124(L)  Potassium 3.5 - 5.1 mmol/L 4.7 4.4 4.4  Chloride 101 - 111 mmol/L 100(L) 98(L) 94(L)  CO2 22 - 32 mmol/L 22 21(L) 19(L)  Calcium 8.9 - 10.3 mg/dL 8.4(L) 8.1(L) 8.2(L)  Total Protein 6.5 - 8.1 g/dL - - -  Total Bilirubin 0.3 - 1.2 mg/dL - - -  Alkaline Phos 38 - 126 U/L - - -  AST 15 - 41 U/L - - -  ALT 14 - 54 U/L - - -   Admission EKG:  EKG Interpretation  Date/Time:  Wednesday April 23 2018 20:06:34 EDT Ventricular Rate:  109 PR Interval:  126 QRS Duration: 78 QT Interval:  304 QTC Calculation: 409 R Axis:   82 Text Interpretation:  Sinus tachycardia Nonspecific T wave abnormality Abnormal ECG When compared with ECG of 08/22/2011, Nonspecific T wave abnormality is now present Confirmed by Delora Fuel ([id number]) on 04/24/2018 2:54:37 PM      Admission CXR: IMPRESSION: - Increasing small bilateral pleural effusions. Worsening right  middle lobe airspace opacity with left lung base atelectasis. - Mild cardiomegaly.  COPD. - Aortic Atherosclerosis  Surgical Pathology: Studies confirming angioimmunoblastic t-cell lymphoma  Discharge Instructions:   Signed: Neva Seat, MD 05/06/2018, 3:20 PM   Pager: [phone]

## 2018-04-26 NOTE — Progress Notes (Signed)
Subjective: Ms Deborah Cooley was seen resting in her bed this morning. She is feeling about the same this morning, but breathing a little easier. Continue to endorse subjective dyspnea. She states she had more LE swelling yesterday which has greatly improved with keeping her legs elevated. She has no complaints or quesitons this morning.  Objective:  Vital signs in last 24 hours: Vitals:   04/25/18 1419 04/25/18 1956 04/25/18 2052 04/26/18 0500  BP: (!) 119/51  (!) 118/53 (!) 116/49  Pulse: (!) 101  98 98  Resp: 18  18 18   Temp: 98.2 F (36.8 C)  (!) 97.5 F (36.4 C) 97.7 F (36.5 C)  TempSrc:   Oral Oral  SpO2: 100% 99% 96% 96%  Weight:      Height:       Physical Exam  Constitutional: She appears well-developed and well-nourished.  HENT:  Head: Normocephalic and atraumatic.  Diffuse cervical, axillary, and pre-auriclar lymphadenopathy  Cardiovascular: Regular rhythm, normal heart sounds and intact distal pulses.  Tachycardic  Pulmonary/Chest: Effort normal. No respiratory distress.  Absent breath sounds bilateral bases  Abdominal: Soft. Bowel sounds are normal. She exhibits distension.  Tender to palpation of RUQ  Musculoskeletal:  pedal edema  Neurological: She is alert.  Skin: Skin is warm and dry.   Assessment/Plan: 62 yo with PMH of HTN, endometriosis s/p hysterectomy, tobacco use and recent hospitalization for multifocal lymphadenopathy and concern for lymphoma who presented for ongoing shortness of breath which has worsened since discharge 3 days prior.   Bilateral pleural effusions Lymphadenopathy, concerning for lymphoma: Admission CXR with persistent bilateral pleural effusions, small interval increase on right. Persistent Abdominal distension on exam. No ascites noted on POCUS on 6/6 or 6/7; though splenomegaly noted. > Thoracentesis cytology: inconclusive (T-cell predominance, not abnormal by flow cytometry). > SW consulted for financial assistance - Working to  arrange oncology follow up and establish patient with [facility] - Effusions remain small (though enlarged from previous on R), will not pursue thoracentesis at this time - L axillary node biopsy results pending, being sent for second opinion and genetic analysis. - PT eval and treat - CMP  Dyspnea Right middle lobe opacity: Stable. Stable dyspnea since discharge 6/3. Likely Multifactorial: Small bilateral effusions; RML opacity (?PNA); Large thoracic lymph node burden; ?Undaignosed COPD (given smoking history). > Opacity worsening on CXR 6/6. Treating for possible PNA in the setting of persistent dyspnea and worsening opcity (although atypical presentation) > Increasing wheezing with PRN Duonebs, will schedule > Saturating well on room air - Augmentin and Azithromycin (Day 3 of 5)  - DuoNebs q6h - PRN Albuterol  Abdominal Distention: Stable. Unclear etiology. No ascites on POCUS, though splenomegaly is present; ?Discomfort from effusions. Continues to have regular bowl movements. Mildly tender to palpation of RUQ. - Continue to monitor - CMP  Pedal Edema: Patient reports more LE edema yesterday, improved with elevation. Stable. Possibly 2/2 low albumin, 2.6 this AM. - Elevate lower extremities  Poor Oral intake: Decreased oral intake due to discomfort from enlarged lymph nodes. - Nutrition consulted, appreciate recommendations - Boost TID between meals  Possible Hypothyroidism: Calcified mass in R lobe of thyroid: Incidental CT finding, again noted on admission CXR. > Per CT Read: Consider non-emergent follow up ultrasound for further evaluation  > TSH elevated to 9.837 - Free T4  Hx of hypertension: BP currently soft to normotensive. History of hypertension, not on medications. - Continue to monitor  FEN: Regular Diet VTE Prophylaxis: Lovenox daily Code Status: Full  Dispo: Anticipated discharge in approximately 2-3 day(s).   Neva Seat, MD 04/26/2018, 7:22 AM Pager:  [phone]

## 2018-04-27 MED ORDER — GUAIFENESIN-DM 100-10 MG/5ML PO SYRP
5.0000 mL | ORAL_SOLUTION | ORAL | Status: DC | PRN
Start: 2018-04-27 — End: 2018-05-11
  Administered 2018-04-27 – 2018-05-05 (×16): 5 mL via ORAL
  Filled 2018-04-27 (×17): qty 5

## 2018-04-27 NOTE — Progress Notes (Signed)
Patient continue to complain of more swelling in her legs and elbows. MD made aware. Will continue to monitor.

## 2018-04-27 NOTE — Progress Notes (Signed)
Subjective: Ms Folse was seen resting in her bed this morning. She continues to feel about the same day to day. Continue to endorse subjective dyspnea. LE swelling persistent today, but stable. Abdominal distention and tenderness with palpation remains stable today as well. We continue to await final results of surgical pathology. No further questions or concerns form patient.  Objective:  Vital signs in last 24 hours: Vitals:   04/26/18 2048 04/26/18 2159 04/27/18 0510 04/27/18 0855  BP:  (!) 115/48 (!) 118/59   Pulse: 99 (!) 109 (!) 102 (!) 102  Resp: 20 18 20 18   Temp:  97.7 F (36.5 C) 97.8 F (36.6 C)   TempSrc:  Oral Oral   SpO2:  94% 93% 95%  Weight:      Height:       Physical Exam  Constitutional: She appears well-developed and well-nourished.  HENT:  Head: Normocephalic and atraumatic.  Diffuse cervical, axillary, and pre-auriclar lymphadenopathy  Cardiovascular: Regular rhythm, normal heart sounds and intact distal pulses.  Tachycardic  Pulmonary/Chest: Effort normal. No respiratory distress.  Absent breath sounds bilateral bases  Abdominal: Soft. Bowel sounds are normal. She exhibits distension.  Tender to palpation of RUQ  Musculoskeletal:  Mild Bilateral LE edema to the knee L>R  Neurological: She is alert.  Skin: Skin is warm and dry.   Assessment/Plan: 62 yo with PMH of HTN, endometriosis s/p hysterectomy, tobacco use and recent hospitalization for multifocal lymphadenopathy and concern for lymphoma who presented for ongoing shortness of breath which has worsened since discharge 3 days prior.   Bilateral pleural effusions Lymphadenopathy, concerning for lymphoma: Stable. Admission CXR with persistent bilateral pleural effusions, small interval increase on right. Persistent Abdominal distension on exam. No ascites noted on POCUS on 6/6 or 6/7; though splenomegaly noted. > Thoracentesis cytology: inconclusive (T-cell predominance, not abnormal by flow  cytometry). > SW consulted for financial assistance - Working to arrange oncology follow up and establish patient with [facility] - Effusions remain small (though enlarged from previous on R), will not pursue thoracentesis at this time - L axillary node biopsy results pending, being sent for second opinion and genetic analysis. - PT eval and treat - CMP in AM  Dyspnea Right middle lobe opacity: Stable. Stable dyspnea since discharge 6/3. Likely Multifactorial: Small bilateral effusions; RML opacity (?PNA); Large thoracic lymph node burden; ?Undaignosed COPD (given smoking history). > Opacity worsening on CXR 6/6. Treating for possible PNA in the setting of persistent dyspnea and worsening opcity (although atypical presentation) > Saturating well on room air - Augmentin and Azithromycin (Day 4 of 5)  - DuoNebs q6h - PRN Albuterol  Abdominal Distention: Stable. Unclear etiology. No ascites on POCUS, though splenomegaly is present; ?Discomfort from effusions. Continues to have regular bowl movements. Tender to palpation of RUQ. - Continue to monitor - CMP in AM  Pedal Edema: Stable. Patient reports stable LE edema, improves with elevation. Likely 2/2 low albumin, 2.6 this AM (If lymphoma, likely due to capillary leak). - Elevate lower extremities  Poor Oral intake: Decreased oral intake due to discomfort from enlarged lymph nodes. - Nutrition consulted, appreciate recommendations - Boost TID between meals  Possible Hypothyroidism: Calcified mass in R lobe of thyroid: Incidental CT finding, again noted on admission CXR. > Per CT Read: Consider non-emergent follow up ultrasound for further evaluation  > TSH elevated to 9.837 > Free T4 lower end of normal at 0.99  Hx of hypertension: BP currently soft to normotensive. History of hypertension, not on  medications. - Continue to monitor  FEN: Regular Diet VTE Prophylaxis: Lovenox daily Code Status: Full  Dispo: Anticipated discharge pending  tissue diagnosis from biospy.   Neva Seat, MD 04/27/2018, 1:07 PM Pager: [phone]

## 2018-04-28 LAB — COMPREHENSIVE METABOLIC PANEL
ALT: 10 U/L — ABNORMAL LOW (ref 14–54)
AST: 25 U/L (ref 15–41)
Albumin: 2.6 g/dL — ABNORMAL LOW (ref 3.5–5.0)
Alkaline Phosphatase: 52 U/L (ref 38–126)
Anion gap: 9 (ref 5–15)
BILIRUBIN TOTAL: 0.9 mg/dL (ref 0.3–1.2)
BUN: 15 mg/dL (ref 6–20)
CALCIUM: 8.5 mg/dL — AB (ref 8.9–10.3)
CO2: 21 mmol/L — ABNORMAL LOW (ref 22–32)
Chloride: 99 mmol/L — ABNORMAL LOW (ref 101–111)
Creatinine, Ser: 0.86 mg/dL (ref 0.44–1.00)
GFR calc Af Amer: >60 mL/min (ref >60)
Glucose, Bld: 150 mg/dL — ABNORMAL HIGH (ref 65–99)
Potassium: 4 mmol/L (ref 3.5–5.1)
Sodium: 129 mmol/L — ABNORMAL LOW (ref 135–145)
TOTAL PROTEIN: 7.6 g/dL (ref 6.5–8.1)

## 2018-04-28 MED ORDER — FUROSEMIDE 10 MG/ML IJ SOLN
20.0000 mg | Freq: Once | INTRAMUSCULAR | Status: AC
  Administered 2018-04-28: 20 mg via INTRAVENOUS
  Filled 2018-04-28: qty 2

## 2018-04-28 NOTE — Progress Notes (Signed)
Subjective: Deborah Cooley was seen resting in her bed this morning. She continues to endorse subjective dyspnea and cough. She states her swelling has worsened somewhat since yesterday extending up to her thigh and some in her arms. She continues to endorse some abdominal pain with her ongoing distention and continues to have bowl movements. Her sister was at bedside today and was updated on the status of her condition. They were informed that we continue to await the results of the second opinion on her pathology. No further questions or complaints at this time.  Objective:  Vital signs in last 24 hours: Vitals:   04/27/18 2116 04/28/18 0255 04/28/18 0610 04/28/18 0905  BP: 117/61  (!) 123/47   Pulse: (!) 110  99 (!) 101  Resp: 20  (!) 24 (!) 22  Temp: 98.3 F (36.8 C)  97.9 F (36.6 C)   TempSrc: Oral  Oral   SpO2: 94% 99% 94% 97%  Weight:   140 lb 6.9 oz (63.7 kg)   Height:       Physical Exam  Constitutional: She appears well-developed and well-nourished.  HENT:  Head: Normocephalic and atraumatic.  Diffuse cervical, axillary, and pre-auriclar lymphadenopathy  Cardiovascular: Regular rhythm, normal heart sounds and intact distal pulses.  Tachycardic  Pulmonary/Chest: Effort normal. No respiratory distress.  Absent breath sounds bilateral bases  Abdominal: Soft. Bowel sounds are normal. She exhibits distension.  Tender to palpation of RUQ  Musculoskeletal:  Mild Bilateral LE edema to the thigh L>R  Neurological: She is alert.  Skin: Skin is warm and dry.   Assessment/Plan: 62 yo with PMH of HTN, endometriosis s/p hysterectomy, tobacco use and recent hospitalization for multifocal lymphadenopathy and concern for lymphoma who presented for ongoing shortness of breath which has worsened since discharge 3 days prior.   Bilateral pleural effusions Lymphadenopathy, concerning for lymphoma: Stable. Admission CXR with persistent bilateral pleural effusions, small interval increase  on right. Persistent Abdominal distension on exam. No ascites noted on POCUS on 6/6 or 6/7; though splenomegaly noted. > Effusions remained small, will continue to monitor. > Thoracentesis cytology: inconclusive (T-cell predominance, not abnormal by flow cytometry). > SW consulted for financial assistance - Working to arrange oncology follow up and establish patient with [facility] - L axillary node biopsy results pending, being sent for second opinion and genetic analysis. - PT eval and treat - CMP in AM  Dyspnea Right middle lobe opacity: Stable. Stable dyspnea since discharge 6/3. Likely Multifactorial: Small bilateral effusions; RML opacity (?PNA); Large thoracic lymph node burden; ?Undaignosed COPD (given smoking history). > Opacity worsening on CXR 6/6. Treating for possible PNA in the setting of persistent dyspnea and worsening opcity (although atypical presentation) > Saturating well on room air - Completed 5 days of Augmentin and Azithromycin  - DuoNebs q6h - PRN Albuterol  Abdominal Distention: No ascites on POCUS, though splenomegaly is present; Likely discomfort from effusions, splenomegaly, and enlarged lymph nodes. Continues to have regular bowl movements. Tender to palpation of RUQ. - Continue to monitor - CMP in AM  Lower Extremity Edema: Worse today. Improves some with elevation. Likely 2/2 low albumin, stable at 2.6 .(If lymphoma, likely due to capillary leak). - Elevate lower extremities - Lasix 40mg  IV x1  Poor Oral intake: Stable. Decreased oral intake due to discomfort from enlarged lymph nodes. - Nutrition consulted, appreciate recommendations - Boost TID between meals  Subclinical hypothyroidism Calcified mass in R lobe of thyroid: Incidental CT finding, again noted on admission CXR. > Per  CT Read: non-emergent follow up ultrasound for further evaluation  > TSH elevated to 9.837 > Free T4 lower end of normal at 0.99  Hx of hypertension: Stable. BP currently soft to  normotensive. History of hypertension, not on medications. - Continue to monitor  FEN: Regular Diet VTE Prophylaxis: Lovenox daily Code Status: Full  Dispo: Anticipated discharge pending tissue diagnosis from biospy.   Neva Seat, MD 04/28/2018, 10:18 AM Pager: [phone]

## 2018-04-28 NOTE — Progress Notes (Signed)
Physical Therapy Treatment Patient Details Name: Deborah Cooley MRN: [id number] DOB: 09/04/1956 Today's Date: 04/28/2018    History of Present Illness Pt is a 62 y.o. F with significant PMH of HTN, endometriosis s/p hysterectomy, tobaccos use and recent hospitalization for multifocal lymphadenopathy and concern for lymphoma who presents for eavluation of recurrent shortness of breath and abdominal pain.    PT Comments    Patient motivated to work with PT this AM. On supplemental O2 with sats at 97% and above at rest. Ambulation of ~100 feet without AD on RA with sats remaining at 94% and above. Does require multiple standing rest breaks but without LOB - min guard only for safety. PT to continue to follow acutely to maximize functional mobility prior to d/c.     Follow Up Recommendations  Home health PT;Supervision for mobility/OOB     Equipment Recommendations  None recommended by PT    Recommendations for Other Services       Precautions / Restrictions Precautions Precautions: Fall Restrictions Weight Bearing Restrictions: No    Mobility  Bed Mobility               General bed mobility comments: sitting on EOB upon PT arrival  Transfers Overall transfer level: Needs assistance Equipment used: None Transfers: Sit to/from Stand Sit to Stand: Supervision         General transfer comment: SUP for safety and immediate standing balance  Ambulation/Gait Ambulation/Gait assistance: Min guard;Supervision Ambulation Distance (Feet): 100 Feet Assistive device: None Gait Pattern/deviations: Step-through pattern;Decreased stride length;Narrow base of support Gait velocity: decreased   General Gait Details: patient requiring multiple standing rest breaks with gait. O2 on RA remaining above 94%    Stairs             Wheelchair Mobility    Modified Rankin (Stroke Patients Only)       Balance Overall balance assessment: Needs assistance Sitting-balance  support: No upper extremity supported;Feet supported Sitting balance-Leahy Scale: Good     Standing balance support: No upper extremity supported;During functional activity Standing balance-Leahy Scale: Fair                              Cognition Arousal/Alertness: Awake/alert Behavior During Therapy: WFL for tasks assessed/performed Overall Cognitive Status: Within Functional Limits for tasks assessed                                        Exercises      General Comments        Pertinent Vitals/Pain Pain Assessment: Faces Faces Pain Scale: Hurts even more Pain Location: abdomen, back and chest from coughing Pain Descriptors / Indicators: Grimacing;Guarding Pain Intervention(s): Limited activity within patient's tolerance;Monitored during session;Repositioned    Home Living                      Prior Function            PT Goals (current goals can now be found in the care plan section) Acute Rehab PT Goals Patient Stated Goal: no pain PT Goal Formulation: With patient Time For Goal Achievement: 05/03/18 Potential to Achieve Goals: Good Progress towards PT goals: Progressing toward goals    Frequency    Min 3X/week      PT Plan Current plan remains appropriate    Co-evaluation  AM-PAC PT "6 Clicks" Daily Activity  Outcome Measure  Difficulty turning over in bed (including adjusting bedclothes, sheets and blankets)?: None Difficulty moving from lying on back to sitting on the side of the bed? : A Little Difficulty sitting down on and standing up from a chair with arms (e.g., wheelchair, bedside commode, etc,.)?: A Little Help needed moving to and from a bed to chair (including a wheelchair)?: A Little Help needed walking in hospital room?: A Little Help needed climbing 3-5 steps with a railing? : A Little 6 Click Score: 19    End of Session Equipment Utilized During Treatment: Gait belt Activity  Tolerance: Patient tolerated treatment well;Patient limited by fatigue Patient left: in chair;with call bell/phone within reach;with family/visitor present;with nursing/sitter in room Nurse Communication: Mobility status PT Visit Diagnosis: Unsteadiness on feet (R26.81);Difficulty in walking, not elsewhere classified (R26.2)     Time: 6168-3729 PT Time Calculation (min) (ACUTE ONLY): 20 min  Charges:  $Gait Training: 8-22 mins                    G Codes:       Lanney Gins, PT, DPT 04/28/18 9:15 AM

## 2018-04-29 DIAGNOSIS — M940 Chondrocostal junction syndrome [Tietze]: Secondary | ICD-10-CM

## 2018-04-29 DIAGNOSIS — E038 Other specified hypothyroidism: Secondary | ICD-10-CM

## 2018-04-29 DIAGNOSIS — E039 Hypothyroidism, unspecified: Secondary | ICD-10-CM

## 2018-04-29 DIAGNOSIS — E871 Hypo-osmolality and hyponatremia: Secondary | ICD-10-CM

## 2018-04-29 LAB — COMPREHENSIVE METABOLIC PANEL
ALT: 12 U/L — ABNORMAL LOW (ref 14–54)
AST: 27 U/L (ref 15–41)
Albumin: 2.6 g/dL — ABNORMAL LOW (ref 3.5–5.0)
Alkaline Phosphatase: 53 U/L (ref 38–126)
Anion gap: 9 (ref 5–15)
BILIRUBIN TOTAL: 0.9 mg/dL (ref 0.3–1.2)
BUN: 15 mg/dL (ref 6–20)
CHLORIDE: 97 mmol/L — AB (ref 101–111)
CO2: 22 mmol/L (ref 22–32)
CREATININE: 0.97 mg/dL (ref 0.44–1.00)
Calcium: 8.6 mg/dL — ABNORMAL LOW (ref 8.9–10.3)
Glucose, Bld: 129 mg/dL — ABNORMAL HIGH (ref 65–99)
Potassium: 4.3 mmol/L (ref 3.5–5.1)
Sodium: 128 mmol/L — ABNORMAL LOW (ref 135–145)
TOTAL PROTEIN: 7.8 g/dL (ref 6.5–8.1)

## 2018-04-29 MED ORDER — DICLOFENAC SODIUM 1 % TD GEL
2.0000 g | Freq: Four times a day (QID) | TRANSDERMAL | Status: DC | PRN
  Filled 2018-04-29: qty 100

## 2018-04-29 MED ORDER — IPRATROPIUM-ALBUTEROL 0.5-2.5 (3) MG/3ML IN SOLN
3.0000 mL | Freq: Two times a day (BID) | RESPIRATORY_TRACT | Status: DC
  Administered 2018-04-29 – 2018-05-05 (×12): 3 mL via RESPIRATORY_TRACT
  Filled 2018-04-29 (×13): qty 3

## 2018-04-29 MED ORDER — FUROSEMIDE 10 MG/ML IJ SOLN
40.0000 mg | Freq: Two times a day (BID) | INTRAMUSCULAR | Status: DC
  Administered 2018-04-29 – 2018-05-01 (×6): 40 mg via INTRAVENOUS
  Filled 2018-04-29 (×6): qty 4

## 2018-04-29 NOTE — Progress Notes (Signed)
Physical Therapy Treatment Patient Details Name: Deborah Cooley MRN: [id number] DOB: [birth year] Today's Date: 04/29/2018    History of Present Illness Pt is a 62 y.o. F with significant PMH of HTN, endometriosis s/p hysterectomy, tobaccos use and recent hospitalization for multifocal lymphadenopathy and concern for lymphoma who presents for eavluation of recurrent shortness of breath and abdominal pain.    PT Comments    PT received in bed with sister present. Patient in higher spirits today with subjective reports of reduced B LE swelling and greater ease of breathing. PT session focusing on increasing mobility with patient able to ambulate a greater distance while maintaining SaO2 >90% on RA, however desat to ~87% at completion of activity requiring ~3 min to recover. Making good progress towards established goals. Recommendations continue to be appropriate.     Follow Up Recommendations  Home health PT;Supervision for mobility/OOB     Equipment Recommendations  None recommended by PT    Recommendations for Other Services       Precautions / Restrictions Precautions Precautions: Fall Restrictions Weight Bearing Restrictions: No    Mobility  Bed Mobility Overal bed mobility: Modified Independent Bed Mobility: Supine to Sit              Transfers Overall transfer level: Needs assistance Equipment used: None Transfers: Sit to/from Stand Sit to Stand: Supervision         General transfer comment: for safety  Ambulation/Gait Ambulation/Gait assistance: Min guard;Supervision Ambulation Distance (Feet): 120 Feet Assistive device: None Gait Pattern/deviations: Step-through pattern;Decreased stride length;Narrow base of support Gait velocity: decreased   General Gait Details: requires standing rest breaks; no AD, but reaches for counter and handrails in hallway - SaO2 on RA remaining at or above 94% during gait, but desats at completion of gait requiring ~3 min to  recover   Stairs             Wheelchair Mobility    Modified Rankin (Stroke Patients Only)       Balance Overall balance assessment: Needs assistance Sitting-balance support: No upper extremity supported;Feet supported Sitting balance-Leahy Scale: Good     Standing balance support: No upper extremity supported;During functional activity Standing balance-Leahy Scale: Fair                              Cognition Arousal/Alertness: Awake/alert Behavior During Therapy: WFL for tasks assessed/performed Overall Cognitive Status: Within Functional Limits for tasks assessed                                        Exercises      General Comments        Pertinent Vitals/Pain Pain Assessment: Faces Faces Pain Scale: Hurts little more Pain Location: chest - from coughing Pain Descriptors / Indicators: Grimacing;Moaning Pain Intervention(s): Limited activity within patient's tolerance;Monitored during session;Repositioned    Home Living                      Prior Function            PT Goals (current goals can now be found in the care plan section) Acute Rehab PT Goals Patient Stated Goal: no pain PT Goal Formulation: With patient Time For Goal Achievement: 05/03/18 Potential to Achieve Goals: Good Progress towards PT goals: Progressing toward goals    Frequency  Min 3X/week      PT Plan Current plan remains appropriate    Co-evaluation              AM-PAC PT "6 Clicks" Daily Activity  Outcome Measure  Difficulty turning over in bed (including adjusting bedclothes, sheets and blankets)?: None Difficulty moving from lying on back to sitting on the side of the bed? : None Difficulty sitting down on and standing up from a chair with arms (e.g., wheelchair, bedside commode, etc,.)?: A Little Help needed moving to and from a bed to chair (including a wheelchair)?: A Little Help needed walking in hospital room?:  A Little Help needed climbing 3-5 steps with a railing? : A Little 6 Click Score: 20    End of Session Equipment Utilized During Treatment: Gait belt Activity Tolerance: Patient tolerated treatment well;Patient limited by fatigue Patient left: in bed;with call bell/phone within reach;with family/visitor present Nurse Communication: Mobility status PT Visit Diagnosis: Unsteadiness on feet (R26.81);Difficulty in walking, not elsewhere classified (R26.2)     Time: 3244-0102 PT Time Calculation (min) (ACUTE ONLY): 18 min  Charges:  $Gait Training: 8-22 mins                    G Codes:        Lanney Gins, PT, DPT 04/29/18 9:17 AM

## 2018-04-29 NOTE — Progress Notes (Signed)
Nutrition Follow-up  DOCUMENTATION CODES:   Not applicable  INTERVENTION:  Continue Boost Breeze po TID, each supplement provides 250 kcal and 9 grams of protein  If PO continues to be less than 25% at meals, consider cortrak placement  NUTRITION DIAGNOSIS:   Inadequate oral intake related to early satiety(lymph node swelling) as evidenced by meal completion < 25%. -progressing  GOAL:   Patient will meet greater than or equal to 90% of their needs -progressing  MONITOR:   PO intake, Supplement acceptance, Labs, Weight trends, Skin, I & O's  REASON FOR ASSESSMENT:   Consult Assessment of nutrition requirement/status, Poor PO  ASSESSMENT:   62 yo Female with PMH of HTN, endometriosis s/p hysterectomy, tobacco use, who recently admitted with multifocal lymphadenopathy and concerns for lymphoma, she presents with shortness of breath and difficulty sleeping. Symptoms become worse at night. She continues to exhibit lymphadenopathy concerning for lymphoma upon this admission, also has a calcified mass in R lobe of thyroid.  Deborah Cooley continues to have abdominal pain, early satiety. Abdomen appear distended upon exam but not firm. She is worried that if she eats large amounts of food it will lead to more abdominal distension and eventually her abdomen becoming firm. She is drinking boost breeze, but did not consume any breakfast this morning.  Labs reviewed:  Na 128 Medications reviewed and include:  40mg  IV   Diet Order:   Diet Order           Diet regular Room service appropriate? Yes; Fluid consistency: Thin; Fluid restriction: 2000 mL Fluid  Diet effective now          EDUCATION NEEDS:   No education needs have been identified at this time  Skin:  Skin Assessment: Reviewed RN Assessment(Ecchymosis to R Arm, generalized abdomen)  Last BM:  04/27/2018  Height:   Ht Readings from Last 1 Encounters:  04/24/18 5\' 2"  (1.575 m)    Weight:   Wt Readings from  Last 1 Encounters:  04/29/18 145 lb 4.5 oz (65.9 kg)    Ideal Body Weight:  50 kg  BMI:  Body mass index is 26.57 kg/m.  Estimated Nutritional Needs:   Kcal:  1600-1800  Protein:  80-95 gm  Fluid:  1.6-1.8 L    Deborah Anis. Cheryl Stabenow, MS, RD LDN Inpatient Clinical Dietitian Pager [phone]

## 2018-04-29 NOTE — Progress Notes (Signed)
Subjective: Deborah Cooley was seen resting in her beside chair this morning. She states she feels a little bit better today and her swelling has improved. She continues to endorse subjective dyspnea and cough and has been having some pain with her cough. We continue to await the results of the second opinion on her pathology. No further questions or complaints at this time.  Objective:  Vital signs in last 24 hours: Vitals:   04/28/18 0905 04/28/18 1406 04/28/18 2035 04/29/18 0457  BP:  119/61 (!) 126/47 (!) 124/50  Pulse: (!) 101 (!) 105 (!) 102 (!) 102  Resp: (!) 22 (!) 22 (!) 21 20  Temp:  98.3 F (36.8 C) 97.8 F (36.6 C) 97.7 F (36.5 C)  TempSrc:  Oral Oral Oral  SpO2: 97% 100% 98% 93%  Weight:    145 lb 4.5 oz (65.9 kg)  Height:       Physical Exam  Constitutional: She appears well-developed and well-nourished.  HENT:  Head: Atraumatic.  Diffuse cervical, axillary, and pre-auriclar lymphadenopathy  Cardiovascular: Regular rhythm, normal heart sounds and intact distal pulses.  Tachycardic  Pulmonary/Chest: Effort normal. No respiratory distress.  Absent breath sounds bilateral bases  Abdominal: Soft. Bowel sounds are normal. She exhibits distension.  Tender to palpation  Musculoskeletal:  Mild Bilateral LE edema to knee L>R  Neurological: She is alert.  Skin: Skin is warm and dry.   Assessment/Plan: 62 yo with PMH of HTN, endometriosis s/p hysterectomy, tobacco use and recent hospitalization for multifocal lymphadenopathy and concern for lymphoma who presented for ongoing shortness of breath which has worsened since discharge 3 days prior.   Bilateral pleural effusions Lymphadenopathy, concerning for lymphoma: Stable. Admission CXR with persistent bilateral pleural effusions, small interval increase on right. Persistent Abdominal distension on exam. No ascites noted on POCUS on 6/6 or 6/7; though splenomegaly noted. > Effusions remained small, will continue to  monitor. > Thoracentesis cytology: inconclusive (T-cell predominance, not abnormal by flow cytometry). > SW consulted for financial assistance - Working to arrange oncology follow up and establish patient with [facility] - L axillary node biopsy results pending, sent for second opinion and genetic analysis. - Continue PT - CMP in AM  Dyspnea Right middle lobe opacity: Stable. Stable dyspnea since discharge 6/3. Likely Multifactorial: Small bilateral effusions; RML opacity (?PNA); Large thoracic lymph node burden; ?Undaignosed COPD (given smoking history). > Opacity worsening on CXR 6/6. Treating for possible PNA in the setting of persistent dyspnea and worsening opcity (although atypical presentation) > Saturating well on room air > Completed 5 days of Augmentin and Azithromycin on 6/10 - DuoNebs q6h - PRN Albuterol - PRN Voltaren gel for costchodritis  Abdominal Distention: Stable. No ascites on POCUS, though splenomegaly is present; Likely discomfort from effusions, splenomegaly, and enlarged lymph nodes. Continues to have regular bowl movements. Tender to palpation. - Continue to monitor - CMP in AM  Lower Extremity Edema: Improving today. Improves some with elevation. Likely 2/2 low albumin, stable at 2.6 .(If lymphoma, likely due to capillary leak). - Elevate lower extremities - Lasix 40mg  IV BID - Strict I&O, Daily Weights - AM CMP  Poor Oral intake: Stable. Decreased oral intake due to discomfort from enlarged lymph nodes. - Nutrition consulted, appreciate recommendations - Boost TID between meals  Subclinical hypothyroidism Calcified mass in R lobe of thyroid: Incidental CT finding, again noted on admission CXR. > Per CT Read: non-emergent follow up ultrasound for further evaluation  > TSH elevated to 9.837 > Free T4  lower end of normal at 0.99  Hx of hypertension: Stable. BP currently soft to normotensive. History of hypertension, not on medications. - Continue to  monitor  FEN: Regular Diet VTE Prophylaxis: Lovenox daily Code Status: Full  Dispo: Anticipated discharge pending tissue diagnosis from biospy.   Deborah Seat, MD 04/29/2018, 10:33 AM Pager: [phone]

## 2018-04-30 ENCOUNTER — Inpatient Hospital Stay (HOSPITAL_COMMUNITY): Payer: Self-pay

## 2018-04-30 DIAGNOSIS — J90 Pleural effusion, not elsewhere classified: Secondary | ICD-10-CM

## 2018-04-30 LAB — COMPREHENSIVE METABOLIC PANEL
ALBUMIN: 2.5 g/dL — AB (ref 3.5–5.0)
ALK PHOS: 47 U/L (ref 38–126)
ALT: 10 U/L — AB (ref 14–54)
AST: 26 U/L (ref 15–41)
Anion gap: 8 (ref 5–15)
BUN: 16 mg/dL (ref 6–20)
CALCIUM: 8.5 mg/dL — AB (ref 8.9–10.3)
CO2: 23 mmol/L (ref 22–32)
CREATININE: 0.87 mg/dL (ref 0.44–1.00)
Chloride: 96 mmol/L — ABNORMAL LOW (ref 101–111)
GFR calc Af Amer: >60 mL/min (ref >60)
GFR calc non Af Amer: >60 mL/min (ref >60)
GLUCOSE: 140 mg/dL — AB (ref 65–99)
Potassium: 3.8 mmol/L (ref 3.5–5.1)
SODIUM: 127 mmol/L — AB (ref 135–145)
Total Bilirubin: 0.9 mg/dL (ref 0.3–1.2)
Total Protein: 7.5 g/dL (ref 6.5–8.1)

## 2018-04-30 LAB — ECHOCARDIOGRAM COMPLETE
Height: 62 in
Weight: 2271.62 oz

## 2018-04-30 MED ORDER — DICLOFENAC SODIUM 1 % TD GEL
2.0000 g | Freq: Four times a day (QID) | TRANSDERMAL | Status: DC
  Administered 2018-04-30 – 2018-05-03 (×15): 2 g via TOPICAL
  Filled 2018-04-30: qty 100

## 2018-04-30 NOTE — Progress Notes (Signed)
  Echocardiogram 2D Echocardiogram has been performed.  Deborah Cooley 04/30/2018, 9:12 AM

## 2018-04-30 NOTE — Progress Notes (Signed)
Subjective: Deborah Cooley was seen sitting on the side of her bed this morning. She feels about the same as yesterday with decreased lower extremity swelling when legs are elevated. She continues to endorse subjective dyspnea and cough. Chest pain from cough persists as she did not receive her PRN Voltaren gel yesterday. We continue to await a second opinion from pathology.  Objective:  Vital signs in last 24 hours: Vitals:   04/29/18 2116 04/30/18 0205 04/30/18 0501 04/30/18 0556  BP: (!) 116/51   (!) 116/48  Pulse: (!) 105   98  Resp: (!) 24   20  Temp: 97.6 F (36.4 C)   (!) 97.4 F (36.3 C)  TempSrc:    Oral  SpO2: 94% 96%  94%  Weight:   141 lb 15.6 oz (64.4 kg)   Height:       Physical Exam  Constitutional: She appears well-developed and well-nourished.  HENT:  Head: Atraumatic.  Diffuse cervical, axillary, and pre-auriclar lymphadenopathy  Cardiovascular: Normal rate, regular rhythm, normal heart sounds and intact distal pulses.  Pulmonary/Chest: Effort normal. No respiratory distress.  Absent breath sounds bilateral bases  Abdominal: Soft. Bowel sounds are normal. She exhibits distension.  Tender to palpation  Musculoskeletal:  Mild Bilateral LE edema to knee L>R  Neurological: She is alert.  Skin: Skin is warm and dry.   Assessment/Plan: 62 yo with PMH of HTN, endometriosis s/p hysterectomy, tobacco use and recent hospitalization for multifocal lymphadenopathy and concern for lymphoma who presented for ongoing shortness of breath which has worsened since discharge 3 days prior.   Bilateral pleural effusions Lymphadenopathy, concerning for lymphoma: Stable. Admission CXR with persistent bilateral pleural effusions, small interval increase on right. Persistent Abdominal distension on exam. No ascites noted on POCUS on 6/6 or 6/7; though splenomegaly noted. > Effusions remained small, will continue to monitor. > Thoracentesis cytology: inconclusive (T-cell predominance,  not abnormal by flow cytometry). > SW consulted for financial assistance - Working to arrange oncology follow up and establish patient with [facility] - L axillary node biopsy results pending, sent for second opinion and genetic analysis. - Continue PT - CMP in AM  Dyspnea Right middle lobe opacity: Stable. Stable dyspnea since discharge 6/3. Likely Multifactorial: Small bilateral effusions; RML opacity (?PNA); Large thoracic lymph node burden; ?Undaignosed COPD (given smoking history). > Opacity worsening on CXR 6/6. Treated for possible PNA in the setting of persistent dyspnea and worsening opcity (although atypical presentation). Completed 5 days of Augmentin and Azithromycin on 6/10. > Saturating well on room air - DuoNebs q12h - PRN Albuterol - Voltaren gel QID for costochondritis   Hypovolemic Hyponatremia: Patient with downtrending Na this admission (135 >> 128). Patient noted to have volume overload LE edema) with 5lbs of weight gain per records. > Na remains low at 127 this AM - Lasix 40mg  IV BID - Strict I&O, Daily Weights - AM CMP  Lower Extremity Edema: Improving today. Improves some with elevation. Likely 2/2 low albumin, stable at 2.6; with a component of volume overload as well. (If lymphoma, likely due to capillary leak). - Elevate lower extremities - Echocardiogram to evaluate cardiac funciton - Lasix 40mg  IV BID - Strict I&O, Daily Weights - AM CMP  Abdominal Distention: Stable. No ascites on POCUS, though splenomegaly is present; Likely discomfort from effusions, splenomegaly, and enlarged lymph nodes. Continues to have regular bowl movements. Tender to palpation. - Continue to monitor - CMP in AM  Poor Oral intake: Stable. Decreased oral intake due to  discomfort from enlarged lymph nodes. - Nutrition consulted, appreciate recommendations - Boost TID between meals  Subclinical hypothyroidism Calcified mass in R lobe of thyroid: Incidental CT finding, again noted on  admission CXR. > Per CT Read: non-emergent follow up ultrasound for further evaluation  > TSH elevated to 9.837 > Free T4 lower end of normal at 0.99  Hx of hypertension: Stable. BP currently soft to normotensive. History of hypertension, not on medications. - Continue to monitor  FEN: Regular Diet VTE Prophylaxis: Lovenox daily Code Status: Full  Dispo: Anticipated discharge pending tissue diagnosis from biospy.   Neva Seat, MD 04/30/2018, 7:20 AM Pager: [phone]

## 2018-05-01 DIAGNOSIS — R600 Localized edema: Secondary | ICD-10-CM

## 2018-05-01 DIAGNOSIS — R140 Abdominal distension (gaseous): Secondary | ICD-10-CM

## 2018-05-01 DIAGNOSIS — R0600 Dyspnea, unspecified: Secondary | ICD-10-CM

## 2018-05-01 DIAGNOSIS — I82409 Acute embolism and thrombosis of unspecified deep veins of unspecified lower extremity: Secondary | ICD-10-CM

## 2018-05-01 DIAGNOSIS — E871 Hypo-osmolality and hyponatremia: Secondary | ICD-10-CM

## 2018-05-01 LAB — COMPREHENSIVE METABOLIC PANEL
ALK PHOS: 55 U/L (ref 38–126)
ALT: 13 U/L — AB (ref 14–54)
ANION GAP: 10 (ref 5–15)
AST: 27 U/L (ref 15–41)
Albumin: 2.6 g/dL — ABNORMAL LOW (ref 3.5–5.0)
BILIRUBIN TOTAL: 0.8 mg/dL (ref 0.3–1.2)
BUN: 18 mg/dL (ref 6–20)
CALCIUM: 8.6 mg/dL — AB (ref 8.9–10.3)
CO2: 24 mmol/L (ref 22–32)
CREATININE: 0.96 mg/dL (ref 0.44–1.00)
Chloride: 93 mmol/L — ABNORMAL LOW (ref 101–111)
GFR calc non Af Amer: >60 mL/min (ref >60)
GLUCOSE: 141 mg/dL — AB (ref 65–99)
Potassium: 3.8 mmol/L (ref 3.5–5.1)
SODIUM: 127 mmol/L — AB (ref 135–145)
TOTAL PROTEIN: 7.6 g/dL (ref 6.5–8.1)

## 2018-05-01 MED ORDER — NAPROXEN 250 MG PO TABS
250.0000 mg | ORAL_TABLET | Freq: Two times a day (BID) | ORAL | Status: DC
  Administered 2018-05-01 – 2018-05-03 (×5): 250 mg via ORAL
  Filled 2018-05-01 (×5): qty 1

## 2018-05-01 MED ORDER — RAMELTEON 8 MG PO TABS
8.0000 mg | ORAL_TABLET | Freq: Every day | ORAL | Status: DC
  Administered 2018-05-01 – 2018-05-10 (×10): 8 mg via ORAL
  Filled 2018-05-01 (×10): qty 1

## 2018-05-01 NOTE — Progress Notes (Signed)
Subjective: Deborah Cooley was seen resting in her bed today. She feels about the same today. She continues to endorse subjective dyspnea and cough. Chest pain from cough improved with Voltaren gel. We continue to await second opinion on pathology results expect result by tomorrow.  Spoke with patients family later in the morning who expressed concern for patient becoming weaker with decreased energy as well as the delay in her diagnosis. They were informed that her weakness if likely a combination of her disease, decreased food intake, and decreased activity while being in the hospital. They were told we will continue PT and Nutritional evaluations. They also expressed concern for her chest pain with gel not lasting and her sleep. Informed we will schedule systemic pain medication and Ramelteon.  They also expressed concern for oxygenation in lower 90s and were told that this is still a safe number for her but we would resume continuous pulse oximetry to ensure it stays appropriate.  Objective:  Vital signs in last 24 hours: Vitals:   05/01/18 0538 05/01/18 0749 05/01/18 0800 05/01/18 0911  BP: (!) 103/41 (!) 97/52    Pulse: 94 97    Resp: 20 (!) 22    Temp: (!) 97.4 F (36.3 C)  97.8 F (36.6 C)   TempSrc: Oral  Oral   SpO2: 97% 95%    Weight: 140 lb 14 oz (63.9 kg)   140 lb 10.5 oz (63.8 kg)  Height:       Physical Exam  Constitutional: She is oriented to person, place, and time. She appears well-developed and well-nourished.  HENT:  Head: Atraumatic.  Diffuse cervical, axillary, and pre-auriclar lymphadenopathy  Cardiovascular: Normal rate, regular rhythm, normal heart sounds and intact distal pulses.  Pulmonary/Chest: Effort normal. No respiratory distress.  Absent breath sounds bilateral bases  Abdominal: Soft. Bowel sounds are normal. She exhibits distension.  Tender to palpation  Musculoskeletal:  Mild Bilateral LE edema to knee L>R  Neurological: She is alert and oriented to  person, place, and time.  Skin: Skin is warm and dry.   Assessment/Plan: 62 yo with PMH of HTN, endometriosis s/p hysterectomy, tobacco use and recent hospitalization for multifocal lymphadenopathy and concern for lymphoma who presented for ongoing shortness of breath which has worsened since discharge 3 days prior.   Bilateral pleural effusions Lymphadenopathy, concerning for lymphoma: Stable. Admission CXR with persistent bilateral pleural effusions, small interval increase on right. Persistent Abdominal distension on exam. No ascites noted on POCUS on 6/6 or 6/7; though splenomegaly noted. > Effusions remained small, will continue to monitor. > Thoracentesis cytology: inconclusive (T-cell predominance, not abnormal by flow cytometry). > SW consulted for financial assistance - Continuous pulse Ox - Working to arrange oncology follow up and establish patient with [facility] - L axillary node biopsy results pending, sent for second opinion and genetic analysis. - Continue PT - CMP in AM  Dyspnea Right middle lobe opacity: Stable. Stable dyspnea since discharge 6/3. Likely Multifactorial: Small bilateral effusions; RML opacity (?PNA); Large thoracic lymph node burden; ?Undaignosed COPD (given smoking history). > Opacity worsening on CXR 6/6. Treated for possible PNA in the setting of persistent dyspnea and worsening opcity (although atypical presentation). Completed 5 days of Augmentin and Azithromycin on 6/10. > Saturates well on room air - DuoNebs q12h - PRN Albuterol - Voltaren gel QID and Naproxen BID 250mg   for costochondritis   Hypervolemic Hyponatremia: Patient with downtrending Na this admission (135 >> 128). Patient noted to have volume overload LE edema) with  5lbs of weight gain per records. > Na remains low/stable at 127 this AM; Weight back to baseline ~140 - Lasix 40mg  IV BID - Strict I&O, Daily Weights - AM CMP  Lower Extremity Edema: Stable. Improves some with elevation. Likely  2/2 low albumin, stable at 2.6; with a component of volume overload as well. (If lymphoma, likely due to capillary leak). - Elevate lower extremities - Echocardiogram showed EF 60-65% and normal diastolic function - Lasix 40mg  IV BID - Strict I&O, Daily Weights - AM CMP  Abdominal Distention: Stable. No ascites on POCUS, though splenomegaly is present; Likely discomfort from effusions, splenomegaly, and enlarged lymph nodes. Continues to have regular bowl movements. Tender to palpation. - Continue to monitor - CMP in AM  Poor Oral intake: Stable. Decreased oral intake due to discomfort from enlarged lymph nodes. - Nutrition consulted, appreciate recommendations - Boost TID between meals  Subclinical hypothyroidism Calcified mass in R lobe of thyroid: Incidental CT finding, again noted on admission CXR. > Per CT Read: non-emergent follow up ultrasound for further evaluation  > TSH elevated to 9.837 > Free T4 lower end of normal at 0.99  Hx of hypertension: Stable. BP currently soft to normotensive. History of hypertension, not on medications. - Continue to monitor  FEN: Regular Diet VTE Prophylaxis: Lovenox daily Code Status: Full  Dispo: Anticipated discharge pending tissue diagnosis from biospy.   Neva Seat, MD 05/01/2018, 9:15 AM Pager: [phone]

## 2018-05-01 NOTE — Progress Notes (Signed)
Physical Therapy Treatment Patient Details Name: Deborah Cooley MRN: [id number] DOB: 26-Jan-1956 Today's Date: 05/01/2018    History of Present Illness Pt is a 62 y.o. F with significant PMH of HTN, endometriosis s/p hysterectomy, tobaccos use and recent hospitalization for multifocal lymphadenopathy and concern for lymphoma who presents for eavluation of recurrent shortness of breath and abdominal pain.    PT Comments    Pt appeared SOB on arrival secondary to completing bathing with student RN, but agreeable to participate with therapy. Pt ambulated 200 ft in hall w/o AD and min guard for safety. Pt demonstrated mild balance deficits, with 1 LOB, no assist required to recover. Frequent brief rests required secondary to DOE, SpO2 remained >90% throughout session. Will continue to follow acutely to maximize activity tolerance and functional independence.     Follow Up Recommendations  Home health PT;Supervision for mobility/OOB     Equipment Recommendations  None recommended by PT    Recommendations for Other Services       Precautions / Restrictions Precautions Precautions: Fall Restrictions Weight Bearing Restrictions: No    Mobility  Bed Mobility Overal bed mobility: Modified Independent                Transfers Overall transfer level: Needs assistance Equipment used: None Transfers: Sit to/from Stand Sit to Stand: Supervision         General transfer comment: for safety  Ambulation/Gait Ambulation/Gait assistance: Min guard Gait Distance (Feet): 200 Feet Assistive device: None Gait Pattern/deviations: Step-through pattern;Decreased stride length;Narrow base of support;Drifts right/left Gait velocity: decreased   General Gait Details: mild instability noted requiring min guard for safety. Pt reaching for hand rails in hallway. Pt required several brief standing rest breaks secondary to DOE. SpO2 remained >90% on RA throughout session. Cues for pursed  lipped breathing.   Stairs Stairs: Yes Stairs assistance: Min guard Stair Management: One rail Right;Alternating pattern;Forwards Number of Stairs: 3 General stair comments: Pt negotiated 3 steps with min guard for safety and 1 hand rail. Pt required standing rest after stair training secondary to DOE.   Wheelchair Mobility    Modified Rankin (Stroke Patients Only)       Balance Overall balance assessment: Needs assistance Sitting-balance support: No upper extremity supported;Feet supported Sitting balance-Leahy Scale: Good     Standing balance support: No upper extremity supported;During functional activity Standing balance-Leahy Scale: Fair Standing balance comment: Pt able to ambulate w/o AD, however demonstrated mild instability.                            Cognition Arousal/Alertness: Awake/alert Behavior During Therapy: WFL for tasks assessed/performed Overall Cognitive Status: Within Functional Limits for tasks assessed                                        Exercises      General Comments        Pertinent Vitals/Pain Pain Assessment: No/denies pain    Home Living                      Prior Function            PT Goals (current goals can now be found in the care plan section) Acute Rehab PT Goals PT Goal Formulation: With patient Time For Goal Achievement: 05/03/18 Potential to Achieve Goals: Good Progress towards  PT goals: Progressing toward goals    Frequency    Min 3X/week      PT Plan Current plan remains appropriate    Co-evaluation              AM-PAC PT "6 Clicks" Daily Activity  Outcome Measure  Difficulty turning over in bed (including adjusting bedclothes, sheets and blankets)?: None Difficulty moving from lying on back to sitting on the side of the bed? : None Difficulty sitting down on and standing up from a chair with arms (e.g., wheelchair, bedside commode, etc,.)?: A Little Help  needed moving to and from a bed to chair (including a wheelchair)?: A Little Help needed walking in hospital room?: A Little Help needed climbing 3-5 steps with a railing? : A Little 6 Click Score: 20    End of Session Equipment Utilized During Treatment: Gait belt Activity Tolerance: Patient tolerated treatment well Patient left: in bed;with call bell/phone within reach;with family/visitor present Nurse Communication: Mobility status PT Visit Diagnosis: Unsteadiness on feet (R26.81);Difficulty in walking, not elsewhere classified (R26.2)     Time: 5537-4827 PT Time Calculation (min) (ACUTE ONLY): 14 min  Charges:  $Gait Training: 8-22 mins                    G Codes:       Benjiman Core, [place] Pager [phone] Acute Rehab   Allena Katz 05/01/2018, 11:02 AM

## 2018-05-02 LAB — COMPREHENSIVE METABOLIC PANEL
ALBUMIN: 2.6 g/dL — AB (ref 3.5–5.0)
ALT: 12 U/L — ABNORMAL LOW (ref 14–54)
ANION GAP: 8 (ref 5–15)
AST: 31 U/L (ref 15–41)
Alkaline Phosphatase: 52 U/L (ref 38–126)
BILIRUBIN TOTAL: 1.1 mg/dL (ref 0.3–1.2)
BUN: 20 mg/dL (ref 6–20)
CHLORIDE: 94 mmol/L — AB (ref 101–111)
CO2: 23 mmol/L (ref 22–32)
Calcium: 8.4 mg/dL — ABNORMAL LOW (ref 8.9–10.3)
Creatinine, Ser: 1.05 mg/dL — ABNORMAL HIGH (ref 0.44–1.00)
GFR calc Af Amer: >60 mL/min (ref >60)
GFR calc non Af Amer: 56 mL/min — ABNORMAL LOW (ref >60)
Glucose, Bld: 141 mg/dL — ABNORMAL HIGH (ref 65–99)
POTASSIUM: 3.7 mmol/L (ref 3.5–5.1)
SODIUM: 125 mmol/L — AB (ref 135–145)
TOTAL PROTEIN: 7.7 g/dL (ref 6.5–8.1)

## 2018-05-02 NOTE — Progress Notes (Signed)
Subjective: Deborah Cooley was seen resting in her bed. She feels about the same as yesterday. She endorse some blood streaks in sputum following a cough overnight, denies clots. She continues to endorse dyspnea and cough. We continue to await second opinion on pathology results expect result by tomorrow.   Objective:  Vital signs in last 24 hours: Vitals:   05/01/18 1543 05/01/18 1958 05/02/18 0514 05/02/18 0807  BP: (!) 104/49  (!) 103/46   Pulse: 98 99 95   Resp:  20 20   Temp: 97.8 F (36.6 C)     TempSrc: Oral     SpO2: 98% 99% 99% (!) 33%  Weight:   141 lb 8.6 oz (64.2 kg)   Height:       Physical Exam  Constitutional: She is oriented to person, place, and time. She appears well-developed and well-nourished.  HENT:  Head: Atraumatic.  Diffuse cervical, axillary, and pre-auriclar lymphadenopathy  Cardiovascular: Regular rhythm, normal heart sounds and intact distal pulses.  Tachcardic  Pulmonary/Chest: No respiratory distress.  Absent breath sounds bilateral bases Mildly increased work of breathing  Abdominal: Soft. Bowel sounds are normal. She exhibits distension.  Tender to palpation  Musculoskeletal:  1-2+ Bilateral LE edema to thigh  Neurological: She is alert and oriented to person, place, and time.  Skin: Skin is warm and dry.   Assessment/Plan: 62 yo with PMH of HTN, endometriosis s/p hysterectomy, tobacco use and recent hospitalization for multifocal lymphadenopathy and concern for lymphoma who presented for ongoing shortness of breath which has worsened since discharge 3 days prior.   Bilateral pleural effusions Lymphadenopathy, concerning for lymphoma: Stable. Admission CXR with persistent bilateral pleural effusions, small interval increase on right. Persistent Abdominal distension on exam. No ascites noted on POCUS on 6/6 or 6/7; though splenomegaly noted. > Effusions remained small, will continue to monitor. > Thoracentesis cytology: inconclusive (T-cell  predominance, not abnormal by flow cytometry). > SW consulted for financial assistance - Continuous pulse Ox - Working to arrange oncology follow up and establish patient with [facility] - L axillary node biopsy results pending, sent for second opinion and genetic analysis. - Continue PT - CMP in AM  Dyspnea Right middle lobe opacity: Stable. Stable dyspnea since discharge 6/3. Likely Multifactorial: Small bilateral effusions; RML opacity (?PNA); Large thoracic lymph node burden; ?Undaignosed COPD (given smoking history). > Opacity worsening on CXR 6/6. Treated for possible PNA in the setting of persistent dyspnea and worsening opcity (although atypical presentation). Completed 5 days of Augmentin and Azithromycin on 6/10. > Saturates well on room air - DuoNebs q12h - PRN Albuterol - Voltaren gel QID and Naproxen BID 250mg   for costochondritis   Hyponatremia: Patient with downtrending Na this admission (135 >> 128). Patient noted to have volume overload LE edema) with 5lbs of weight gain per records. With failure to improve with Lasix and return to stable weight, will discontinue lasix and continue to monitor Na. > Na remains low at 125 this AM; Weight back to baseline ~140 - Strict I&O, Daily Weights - AM CMP  Lower Extremity Edema: Stable. Improves some with elevation. Likely 2/2 low albumin, stable at 2.6; with a component of volume overload as well. (If lymphoma, likely due to capillary leak). Volume overload treated with lasix with improvement in patient weight, lasix discontinued. > Echocardiogram showed EF 60-65% and normal diastolic function - Elevate lower extremities - Strict I&O, Daily Weights - Compression stocking or ACE wrap - Discontinue Lasix - AM CMP  Abdominal Distention:  StStableable. No ascites on POCUS, though splenomegaly is present; Likely discomfort from effusions, splenomegaly, and enlarged lymph nodes. Continues to have bowl movements. Tender to palpation. - Continue  to monitor - CMP in AM  Poor Oral intake: Stable. Decreased oral intake due to discomfort from enlarged lymph nodes. - Nutrition consulted, appreciate recommendations - Boost TID between meals  Subclinical hypothyroidism Calcified mass in R lobe of thyroid: Incidental CT finding, again noted on admission CXR. > Per CT Read: non-emergent follow up ultrasound for further evaluation  > TSH elevated to 9.837 > Free T4 lower end of normal at 0.99  Hx of hypertension: Stable. BP currently soft to normotensive. History of hypertension, not on medications. - Continue to monitor  FEN: Regular Diet VTE Prophylaxis: Lovenox daily Code Status: Full  Dispo: Anticipated discharge pending tissue diagnosis from biospy.   Deborah Seat, MD 05/02/2018, 11:20 AM Pager: [phone]

## 2018-05-02 NOTE — Plan of Care (Signed)
Pt is awaiting biopsy reports.

## 2018-05-03 DIAGNOSIS — D649 Anemia, unspecified: Secondary | ICD-10-CM

## 2018-05-03 DIAGNOSIS — R161 Splenomegaly, not elsewhere classified: Secondary | ICD-10-CM

## 2018-05-03 DIAGNOSIS — R042 Hemoptysis: Secondary | ICD-10-CM

## 2018-05-03 DIAGNOSIS — E8809 Other disorders of plasma-protein metabolism, not elsewhere classified: Secondary | ICD-10-CM

## 2018-05-03 DIAGNOSIS — E079 Disorder of thyroid, unspecified: Secondary | ICD-10-CM

## 2018-05-03 DIAGNOSIS — E02 Subclinical iodine-deficiency hypothyroidism: Secondary | ICD-10-CM

## 2018-05-03 LAB — COMPREHENSIVE METABOLIC PANEL
ALBUMIN: 2.5 g/dL — AB (ref 3.5–5.0)
ALK PHOS: 55 U/L (ref 38–126)
ALT: 12 U/L — AB (ref 14–54)
AST: 27 U/L (ref 15–41)
Anion gap: 7 (ref 5–15)
BILIRUBIN TOTAL: 1.1 mg/dL (ref 0.3–1.2)
BUN: 23 mg/dL — ABNORMAL HIGH (ref 6–20)
CO2: 25 mmol/L (ref 22–32)
Calcium: 8.5 mg/dL — ABNORMAL LOW (ref 8.9–10.3)
Chloride: 92 mmol/L — ABNORMAL LOW (ref 101–111)
Creatinine, Ser: 1.31 mg/dL — ABNORMAL HIGH (ref 0.44–1.00)
GFR calc Af Amer: 49 mL/min — ABNORMAL LOW (ref >60)
GFR calc non Af Amer: 43 mL/min — ABNORMAL LOW (ref >60)
GLUCOSE: 142 mg/dL — AB (ref 65–99)
Potassium: 3.9 mmol/L (ref 3.5–5.1)
Sodium: 124 mmol/L — ABNORMAL LOW (ref 135–145)
TOTAL PROTEIN: 7.8 g/dL (ref 6.5–8.1)

## 2018-05-03 LAB — CBC WITH DIFFERENTIAL/PLATELET
Basophils Absolute: 0.1 10*3/uL (ref 0.0–0.1)
Basophils Relative: 1 %
Eosinophils Absolute: 0 10*3/uL (ref 0.0–0.7)
Eosinophils Relative: 0 %
HCT: 23.6 % — ABNORMAL LOW (ref 36.0–46.0)
Hemoglobin: 8.7 g/dL — ABNORMAL LOW (ref 12.0–15.0)
LYMPHS ABS: 1.8 10*3/uL (ref 0.7–4.0)
Lymphocytes Relative: 22 %
MCH: 34.4 pg — ABNORMAL HIGH (ref 26.0–34.0)
MCHC: 36.9 g/dL — AB (ref 30.0–36.0)
MCV: 93.3 fL (ref 78.0–100.0)
MONO ABS: 1 10*3/uL (ref 0.1–1.0)
Monocytes Relative: 12 %
NEUTROS ABS: 5.2 10*3/uL (ref 1.7–7.7)
Neutrophils Relative %: 65 %
PLATELETS: 67 10*3/uL — AB (ref 150–400)
RBC: 2.53 MIL/uL — AB (ref 3.87–5.11)
RDW: 20.1 % — AB (ref 11.5–15.5)
WBC: 8.1 10*3/uL (ref 4.0–10.5)

## 2018-05-03 LAB — PROTIME-INR
INR: 1.03
Prothrombin Time: 13.4 seconds (ref 11.4–15.2)

## 2018-05-03 LAB — LACTATE DEHYDROGENASE: LDH: 322 U/L — ABNORMAL HIGH (ref 98–192)

## 2018-05-03 LAB — OSMOLALITY, URINE: Osmolality, Ur: 427 mOsm/kg (ref 300–900)

## 2018-05-03 LAB — OSMOLALITY: Osmolality: 278 mOsm/kg (ref 275–295)

## 2018-05-03 LAB — SODIUM, URINE, RANDOM

## 2018-05-03 LAB — URIC ACID: Uric Acid, Serum: 16.7 mg/dL — ABNORMAL HIGH (ref 2.3–6.6)

## 2018-05-03 MED ORDER — DIPHENHYDRAMINE HCL 50 MG PO CAPS
50.0000 mg | ORAL_CAPSULE | Freq: Every evening | ORAL | Status: DC | PRN
  Administered 2018-05-04 – 2018-05-05 (×3): 50 mg via ORAL
  Filled 2018-05-03: qty 1
  Filled 2018-05-03 (×3): qty 2

## 2018-05-03 MED ORDER — ALLOPURINOL 100 MG PO TABS
300.0000 mg | ORAL_TABLET | Freq: Every day | ORAL | Status: DC
  Administered 2018-05-03 – 2018-05-11 (×9): 300 mg via ORAL
  Filled 2018-05-03 (×3): qty 1
  Filled 2018-05-03: qty 3
  Filled 2018-05-03 (×7): qty 1

## 2018-05-03 MED ORDER — DEXAMETHASONE 4 MG PO TABS
20.0000 mg | ORAL_TABLET | Freq: Two times a day (BID) | ORAL | Status: AC
  Administered 2018-05-03 – 2018-05-06 (×8): 20 mg via ORAL
  Filled 2018-05-03 (×10): qty 5

## 2018-05-03 MED ORDER — SODIUM CHLORIDE 0.9 % IV SOLN
INTRAVENOUS | Status: DC
  Administered 2018-05-03 – 2018-05-09 (×4): via INTRAVENOUS

## 2018-05-03 NOTE — Progress Notes (Signed)
Subjective: Deborah Cooley was seen resting in her bed. She feels about the same as yesterday. She endorse some blood streaks in sputum following a cough overnight, denies clots. She continues to endorse dyspnea and cough. We continue to await second opinion on pathology results expect result by tomorrow.   Objective:  Vital signs in last 24 hours: Vitals:   05/02/18 2022 05/02/18 2240 05/03/18 0500 05/03/18 0545  BP:  (!) 100/51  (!) 93/55  Pulse: 96 96  88  Resp: 20 20  (!) 22  Temp:  97.7 F (36.5 C)  97.7 F (36.5 C)  TempSrc:  Oral  Oral  SpO2: 98% 96%  100%  Weight:   144 lb 2.9 oz (65.4 kg)   Height:       General: Chronically-ill appearing caucasian female sitting upright in bed. Looked fatigued but not distressed. Sister present, anxious. HENT:EOMI. No conjunctival injection, icterus or ptosis. Oropharynx clear. Prominent cervical, axillary and peri-auricular lymphadenopathy.  Cardiovascular: Regular rate and rhythm. No murmur or rub appreciated. Pulmonary: Diminished breath sounds bilateral bases, R>L. Dyspneic with exertion. On continuous pulse-ox.   Abdomen: Distended, TTP throughout (L>R). Splenomegaly appreciated.  Extremities: Compression stockings on, can still see some edema but improved.  Skin: Warm, dry. No cyanosis. Psych: Mood normal and affect was mood congruent. Responds to questions appropriately.   Assessment/Plan: 62 yo presented for evaluation of DOE found to have diffuse cervical, thoracic, and abdominal lymphadenopathy. 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   Diffuse lymphadenopathy concerning for T-cell Lymphoma Splenomegaly Bilateral pleural effusions, small Still with DOE but reports being able to sleep a little better last night. 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 Patient and family understandably frustrated with delay and her persistent symptoms. She continues to experience edema, electrolyte abnormalities and increased hematologic abnormalities consistent with this (capillary leak, bone marrow infiltration). She ultimately requires a bone marrow biopsy and CMV/EBV studies which have been ordered. Will start patient on high-dose steroids for now to hopefully improve some symptoms, otherwise we await the final pathology report.  -Bone marrow biopsy ordered; need to coordinate with IR and cytology. ?6/17 -PO Decadron 20 mg BID (have also added PO Benadryl 50 mg prn insomnia) -Albuterol, supplemental O2 and robitussin-dm prn -Could consider therapeutic thoracentesis if pleural effusions were to enlarge/declining respiratory status -Axillary node biopsy results pending -Continue PT  Hyponatremia: Patient with hyponatremia which is downtrending. Initially thought to be secondary to hypervolemia however did not improve with lasix. Suspect related to capillary leak; difficult to manage given underlying process.  -Monitor AM BMET -I&O, Daily Weights  Lower Extremity Edema: Stable. Improves some with elevation. Likely 2/2 low albumin and capillary leak. Volume overload treated with lasix with improvement in patient weight, lasix discontinued.  -Echocardiogram showed EF 60-65% and normal diastolic function -Elevate lower extremities -Strict I&O, Daily Weights -Compression stocking or ACE wrap -Continue to hold Lasix - AM BMP  Poor Oral intake: Stable. Decreased oral intake due to discomfort from enlarged lymph nodes. - Boost TID between meals; Nutrition on  Subclinical hypothyroidism Calcified mass in R lobe of thyroid: Incidental CT finding, again noted on admission CXR. Per CT Read: non-emergent follow up ultrasound for further evaluation. TSH elevated to 9.83. Free T4 lower  end of normal at 0.99  Thrombocytopenia, Anemia: Likely secondary  to bone marrow involvement. Holding Lovenox for now, especially given some hemoptysis yesterday morning. Plts 67,000/Hb 8.7; will follow and transfuse if needed.   FEN: Regular Diet VTE Prophylaxis: TED hose/SCDs Code Status: Full  Dispo: Anticipated discharge pending tissue diagnosis from biospy.   Molt, [place], DO 05/03/2018, 11:02 AM Pager: [phone] 

## 2018-05-03 NOTE — Consult Note (Signed)
Chief Complaint: Patient was seen in consultation today for polylymphadenopathy.  Referring Physician(s): Aldine Contes  Supervising Physician: Arne Cleveland  Patient Status: [facility] - In-pt  History of Present Illness: Deborah Cooley is a 62 y.o. female with a past medical history of hypertension, mixed hyperlipidemia,pneumonia, endometriosis, and tobacco use. She presented to ED 04/18/2018 with complaints of abdominal discomfort and enlarging lymph nodes. She was admitted and worked up for possible lymphoma. She underwent a left axilla lymph node biopsy 04/21/2018 with Dr. Kieth Brightly, results pending. She was discharged home 04/21/2018, re-presented to ED 04/24/2018 with complaint of shortness of breath, and re-admitted.  IR requested by Dr. Dareen Piano for possible image-guided bone marrow biopsy. Patient awake and alert sitting in bed. Accompanied by sister and husband at bedside. Complains of dyspnea, stable at this time. Denies fever, chest pain, abdominal pain, or dizziness.  Past Medical History:  Diagnosis Date  . Bilateral pleural effusion 04/17/2018  . Endometriosis   . Essential hypertension, benign   . Family history of adverse reaction to anesthesia    "sister stops breathing I think" (04/18/2018)  . Heart murmur   . Mixed hyperlipidemia   . Pneumonia 04/17/2018    Past Surgical History:  Procedure Laterality Date  . ABDOMINAL HYSTERECTOMY  1970s/1980s   for endometriosis  . Arthroscopic right shoulder surgery    . AXILLARY LYMPH NODE BIOPSY Left 04/21/2018   Procedure: LEFT AXILLARY LYMPH NODE BIOPSY;  Surgeon: Kieth Brightly, Arta Bruce, MD;  Location: [facility];  Service: General;  Laterality: Left;  . CARPAL TUNNEL RELEASE Right   . GANGLION CYST EXCISION Right   . MULTIPLE TOOTH EXTRACTIONS    . OOPHORECTOMY  1997  . PARTIAL HYSTERECTOMY  1984  . Right hand surgery    . SHOULDER OPEN ROTATOR CUFF REPAIR Right   . VESICOVAGINAL FISTULA CLOSURE W/ TAH       Allergies: Patient has no known allergies.  Medications: Prior to Admission medications   Not on File     Family History  Problem Relation Age of Onset  . Diabetes Mother        age 15  . Heart attack Father        died age 53's  . Hypertension Father   . Cancer Sister        breast cancer diagonsed age 32 years  . Diabetes Unknown   . Heart disease Unknown        Female <55  . Arthritis Unknown   . Asthma Unknown   . Hyperlipidemia Other        several siblings  . Thyroid disease Sister        one sister with thyroid disease    Social History   Socioeconomic History  . Marital status: Married    Spouse name: Not on file  . Number of children: Not on file  . Years of education: Not on file  . Highest education level: Not on file  Occupational History  . Occupation: Social research officer, government    Comment: works at [employer]  . Financial resource strain: Not on file  . Food insecurity:    Worry: Not on file    Inability: Not on file  . Transportation needs:    Medical: Not on file    Non-medical: Not on file  Tobacco Use  . Smoking status: Current Every Day Smoker    Packs/day: 1.50    Years: 46.00    Pack years: 69.00  Types: Cigarettes  . Smokeless tobacco: Never Used  Substance and Sexual Activity  . Alcohol use: Not Currently    Comment: 04/18/2018 "quit years ago"  . Drug use: Not Currently    Comment: "back in my 79s"  . Sexual activity: Not Currently  Lifestyle  . Physical activity:    Days per week: Not on file    Minutes per session: Not on file  . Stress: Not on file  Relationships  . Social connections:    Talks on phone: Not on file    Gets together: Not on file    Attends religious service: Not on file    Active member of club or organization: Not on file    Attends meetings of clubs or organizations: Not on file    Relationship status: Not on file  Other Topics Concern  . Not on file  Social History Narrative    ** Merged History Encounter **       Patient has one son whom is healthy.   Has a live in boyfriend.     Review of Systems: A 12 point ROS discussed and pertinent positives are indicated in the HPI above.  All other systems are negative.  Review of Systems  Constitutional: Negative for chills and fever.  Respiratory: Positive for shortness of breath. Negative for wheezing.   Cardiovascular: Negative for chest pain and palpitations.  Gastrointestinal: Negative for abdominal pain.  Neurological: Negative for dizziness.  Psychiatric/Behavioral: Negative for behavioral problems and confusion.    Vital Signs: BP (!) 93/55 (BP Location: Right Arm)   Pulse 88   Temp 97.7 F (36.5 C) (Oral)   Resp (!) 22   Ht 5' 2" (1.575 m)   Wt 144 lb 2.9 oz (65.4 kg)   SpO2 100%   BMI 26.37 kg/m   Physical Exam  Constitutional: She is oriented to person, place, and time. She appears well-developed and well-nourished. No distress.  Cardiovascular: Normal rate, regular rhythm and normal heart sounds.  No murmur heard. Pulmonary/Chest: Effort normal and breath sounds normal. No respiratory distress. She has no wheezes.  Neurological: She is alert and oriented to person, place, and time.  Skin: Skin is warm and dry.  Psychiatric: She has a normal mood and affect. Her behavior is normal. Judgment and thought content normal.  Nursing note and vitals reviewed.    MD Evaluation Airway: WNL Heart: WNL Abdomen: WNL Chest/ Lungs: WNL ASA  Classification: 3 Mallampati/Airway Score: Two   Imaging: Dg Chest 2 View  Result Date: 04/23/2018 CLINICAL DATA:  Recent thoracentesis, worsening shortness of breath would like swelling. Productive cough. History of pleural effusion, pneumonia, left axillary node biopsy April 21, 2018. EXAM: CHEST - 2 VIEW COMPARISON:  Chest radiograph April 19, 2018 and CT chest Apr 18, 2018 FINDINGS: Increasing small bilateral pleural effusions. Diffuse interstitial prominence.  Increased lung volumes and flattened hemidiaphragms. Worsening patchy right middle lobe airspace opacity. Left lung base atelectasis. Cardiac silhouette mildly enlarged. Calcified aortic arch. No pneumothorax. Calcified right thyroid nodule. Soft tissue planes and included osseous structures are not suspicious. IMPRESSION: Increasing small bilateral pleural effusions. Worsening right middle lobe airspace opacity with left lung base atelectasis. Mild cardiomegaly.  COPD. Aortic Atherosclerosis (ICD10-I70.0). Electronically Signed   By: Elon Alas M.D.   On: 04/23/2018 21:15   Dg Chest 2 View  Result Date: 04/19/2018 CLINICAL DATA:  Productive cough and shortness of breath for 1 week. EXAM: CHEST - 2 VIEW COMPARISON:  PA and lateral  chest 04/17/2018. CT chest, abdomen and pelvis 04/18/2018. FINDINGS: Small bilateral pleural effusions are seen, greater on the right. Basilar atelectasis is noted. No consolidative process or pneumothorax. Heart size is normal. Aortic atherosclerosis is noted. Calcified lesion in the right lobe of the thyroid is noted. No acute bony abnormality. IMPRESSION: Small bilateral pleural effusions and mild basilar atelectasis. Atherosclerosis. Electronically Signed   By: Inge Rise M.D.   On: 04/19/2018 13:55   Dg Chest 2 View  Result Date: 04/17/2018 CLINICAL DATA:  Abdominal pain.  Shortness of breath. EXAM: CHEST - 2 VIEW COMPARISON:  None. FINDINGS: The heart size is normal. The hila and mediastinum are unremarkable. A 2.2 cm eggshell calcification is seen in the right thoracic inlet. No pneumothorax. Mild interstitial prominence bilaterally. Small bilateral pleural effusions. No nodules or masses. No other acute abnormalities. IMPRESSION: 1. Mild interstitial opacities in the lungs are nonspecific. Edema could have this appearance but the lack of cardiomegaly would be unusual. Atypical infection is possible. 2. Small bilateral effusions. 3. The eggshell calcification in  the right thoracic inlet is nonspecific. This could be a vascular structure or a calcified nodule in the thyroid. Electronically Signed   By: Dorise Bullion III M.D   On: 04/17/2018 23:32   Ct Angio Chest Pe W/cm &/or Wo Cm  Addendum Date: 04/18/2018   ADDENDUM REPORT: 04/18/2018 08:59 ADDENDUM: Add to IMPRESSION: There is wall thickening in the urinary bladder with mild soft tissue stranding adjacent to the bladder. Suspect a degree of cystitis. Electronically Signed   By: Lowella Grip III M.D.   On: 04/18/2018 08:59   Result Date: 04/18/2018 CLINICAL DATA:  Shortness of breath.  Abdominal pain EXAM: CT ANGIOGRAPHY CHEST CT ABDOMEN AND PELVIS WITH CONTRAST TECHNIQUE: Multidetector CT imaging of the chest was performed using the standard protocol during bolus administration of intravenous contrast. Multiplanar CT image reconstructions and MIPs were obtained to evaluate the vascular anatomy. Multidetector CT imaging of the abdomen and pelvis was performed using the standard protocol during bolus administration of intravenous contrast. CONTRAST:  110m ISOVUE-370 IOPAMIDOL (ISOVUE-370) INJECTION 76% COMPARISON:  Chest radiograph Apr 17, 2018 FINDINGS: CTA CHEST FINDINGS Cardiovascular: There is no demonstrable pulmonary embolus. There is no thoracic aortic aneurysm or dissection. There are foci of calcification in the proximal visualized great vessels. There is atherosclerotic calcification in the thoracic aorta. There are scattered foci of coronary artery calcification. There is a small pericardial effusion. Mediastinum/Nodes: There is a partially calcified mass arising from the right lobe of the thyroid measuring 2.7 x 1.9 cm. Thyroid otherwise appears unremarkable. There is extensive adenopathy throughout the chest. There are multiple supraclavicular lymph nodes, largest measuring 1.0 x 0.9 cm. There are multiple axillary lymph nodes. The largest axillary lymph node on the left measures 2.5 x 2.1 cm cm.  The largest axillary lymph node on the right measures 2.3 x 2.0 cm. There is adenopathy throughout the mediastinum. There are multiple aortopulmonary window region lymph nodes, largest measuring 1.4 x 1.4 cm. There is extensive adenopathy in the mediastinum surrounding the trachea and carina. There is a right pretracheal lymph node measuring 1.9 x 1.8 cm. There is a lymph node to the left of the origin of the carina measuring 1.6 x 1.5 cm. There is extensive adenopathy in the right hilar region. The largest individual lymph node in the right hilum measures 2.2 x 1.4 cm. There are multiple left hilar lymph nodes. The largest left hilar lymph node measures 1.8 x 1.3 cm.  There is extensive subcarinal adenopathy. Largest subcarinal lymph node measures 2.8 x 2.4 cm. There are retrocrural lymph nodes bilaterally, largest on the left measuring 1.3 x 1.0 cm. There are several small lymph nodes at the level of the right pericardiophrenic angle. No esophageal lesions are evident. Lungs/Pleura: There are free-flowing pleural effusions bilaterally, larger on the right than on the left. There is consolidation in both lung bases, likely primarily due to compressive atelectasis. There is atelectatic change with volume loss in the right middle lobe medially with focal consolidation adjacent to the right heart border in the medial segment right middle lobe. No pulmonary nodular type lesion is evident on this study. Musculoskeletal: There are no blastic or lytic bone lesions. No chest wall lesions are evident. Several benign-appearing calcifications are noted in the right breast. Review of the MIP images confirms the above findings. CT ABDOMEN and PELVIS FINDINGS Hepatobiliary: No focal liver lesions are appreciable. There is no appreciable gallbladder wall thickening. No biliary duct dilatation. Pancreas: No pancreatic mass or inflammatory focus. Extensive peripancreatic adenopathy is noted. Spleen: Spleen measures 15.0 x 12.4 x 6.8  cm with a measured splenic volume of 632 cubic cm. No focal splenic lesions are evident. Adrenals/Urinary Tract: Adrenals bilaterally appear unremarkable. Kidneys bilaterally show no evident mass or hydronephrosis. No renal or ureteral calculus. No hydronephrosis. Urinary bladder is midline with thickening of the urinary bladder wall. There is also mild soft tissue stranding surrounding the urinary bladder. Stomach/Bowel: There is no appreciable bowel wall or mesenteric thickening. There is no evident bowel obstruction. No free air or portal venous air. Vascular/Lymphatic: There is atherosclerotic calcification throughout the aorta and iliac arteries. There is moderate narrowing of the aorta with what appears to be hemodynamically significant obstruction in both common femoral arteries. No aneurysm. Major mesenteric arterial vessels appear patent. There is extensive adenopathy throughout the abdomen. There are multiple enlarged lymph nodes in the peripancreatic region, largest measuring 1.7 x 1.5 cm. Scattered prominent mesenteric lymph nodes are noted throughout the abdomen. There is extensive retroperitoneal adenopathy. Largest retroperitoneal lymph node measures 2.5 x 1.5 cm. There is adenopathy to the right of the celiac axis measuring 2.3 x 1.5 cm. Adenopathy is seen in both external iliac node chains. The largest lymph node in this area on the right measures 2.6 x 1.7 cm. The largest lymph node in this region on the left measures 2.0 by 1.4 cm. There is also extensive adenopathy more posteriorly in the pelvis at the level of each acetabulum. The largest of these lymph nodes on the right measures 3.1 x 1.7 cm. Largest of these lymph nodes on the left measures 3.2 x 1.6 cm. There is adenopathy in both inguinal regions. Largest of these lymph nodes in the inguinal regions on the right measures 1.4 x 1.3 cm. Largest lymph node in the left inguinal region measures 1.8 x 1.6 cm. Reproductive: Uterus is apparently  absent. No pelvic mass apart from adenopathy seen. Other: Appendix region appears normal. No abscess is evident in the abdomen or pelvis. There is mild ascites. Musculoskeletal: There are no appreciable blastic or lytic bone lesions. No intramuscular or abdominal wall lesions are evident. Review of the MIP images confirms the above findings. IMPRESSION: CT angiogram chest: 1. No demonstrable pulmonary embolus. No thoracic aortic aneurysm or dissection. Multiple foci of aortic atherosclerosis noted as well as foci of calcification in great vessels and coronary arteries. 2. Extensive adenopathy at multiple sites as summarized above. This extensive adenopathy raises concern for  potential lymphoma. Small cell lung carcinoma could present in this manner as well. 3. Moderate pleural effusions bilaterally, larger on the right than on the left, with compressive atelectasis in the lung bases. There is focal consolidation felt to represent pneumonia adjacent to the right heart border in the medial segment right middle lobe. 4. Partially calcified dominant mass right lobe of thyroid. This mass may warrant nonemergent thyroid ultrasound to further evaluate. CT abdomen and pelvis: 1. Widespread abdominal and pelvic adenopathy. Splenomegaly. This combination of findings raises concern for lymphoma as most likely etiology. 2.  Mild ascites. 3. No abscess in the abdomen or pelvis. No periappendiceal region inflammation. 4. Extent extensive aortoiliac atherosclerosis with what appears to be hemodynamically significant obstruction in the common iliac arteries bilaterally. 5.  Uterus apparently absent. Aortic Atherosclerosis (ICD10-I70.0). Electronically Signed: By: Lowella Grip III M.D. On: 04/18/2018 08:41   Ct Abdomen Pelvis W Contrast  Addendum Date: 04/18/2018   ADDENDUM REPORT: 04/18/2018 08:59 ADDENDUM: Add to IMPRESSION: There is wall thickening in the urinary bladder with mild soft tissue stranding adjacent to the  bladder. Suspect a degree of cystitis. Electronically Signed   By: Lowella Grip III M.D.   On: 04/18/2018 08:59   Result Date: 04/18/2018 CLINICAL DATA:  Shortness of breath.  Abdominal pain EXAM: CT ANGIOGRAPHY CHEST CT ABDOMEN AND PELVIS WITH CONTRAST TECHNIQUE: Multidetector CT imaging of the chest was performed using the standard protocol during bolus administration of intravenous contrast. Multiplanar CT image reconstructions and MIPs were obtained to evaluate the vascular anatomy. Multidetector CT imaging of the abdomen and pelvis was performed using the standard protocol during bolus administration of intravenous contrast. CONTRAST:  191m ISOVUE-370 IOPAMIDOL (ISOVUE-370) INJECTION 76% COMPARISON:  Chest radiograph Apr 17, 2018 FINDINGS: CTA CHEST FINDINGS Cardiovascular: There is no demonstrable pulmonary embolus. There is no thoracic aortic aneurysm or dissection. There are foci of calcification in the proximal visualized great vessels. There is atherosclerotic calcification in the thoracic aorta. There are scattered foci of coronary artery calcification. There is a small pericardial effusion. Mediastinum/Nodes: There is a partially calcified mass arising from the right lobe of the thyroid measuring 2.7 x 1.9 cm. Thyroid otherwise appears unremarkable. There is extensive adenopathy throughout the chest. There are multiple supraclavicular lymph nodes, largest measuring 1.0 x 0.9 cm. There are multiple axillary lymph nodes. The largest axillary lymph node on the left measures 2.5 x 2.1 cm cm. The largest axillary lymph node on the right measures 2.3 x 2.0 cm. There is adenopathy throughout the mediastinum. There are multiple aortopulmonary window region lymph nodes, largest measuring 1.4 x 1.4 cm. There is extensive adenopathy in the mediastinum surrounding the trachea and carina. There is a right pretracheal lymph node measuring 1.9 x 1.8 cm. There is a lymph node to the left of the origin of the  carina measuring 1.6 x 1.5 cm. There is extensive adenopathy in the right hilar region. The largest individual lymph node in the right hilum measures 2.2 x 1.4 cm. There are multiple left hilar lymph nodes. The largest left hilar lymph node measures 1.8 x 1.3 cm. There is extensive subcarinal adenopathy. Largest subcarinal lymph node measures 2.8 x 2.4 cm. There are retrocrural lymph nodes bilaterally, largest on the left measuring 1.3 x 1.0 cm. There are several small lymph nodes at the level of the right pericardiophrenic angle. No esophageal lesions are evident. Lungs/Pleura: There are free-flowing pleural effusions bilaterally, larger on the right than on the left. There is consolidation in  both lung bases, likely primarily due to compressive atelectasis. There is atelectatic change with volume loss in the right middle lobe medially with focal consolidation adjacent to the right heart border in the medial segment right middle lobe. No pulmonary nodular type lesion is evident on this study. Musculoskeletal: There are no blastic or lytic bone lesions. No chest wall lesions are evident. Several benign-appearing calcifications are noted in the right breast. Review of the MIP images confirms the above findings. CT ABDOMEN and PELVIS FINDINGS Hepatobiliary: No focal liver lesions are appreciable. There is no appreciable gallbladder wall thickening. No biliary duct dilatation. Pancreas: No pancreatic mass or inflammatory focus. Extensive peripancreatic adenopathy is noted. Spleen: Spleen measures 15.0 x 12.4 x 6.8 cm with a measured splenic volume of 632 cubic cm. No focal splenic lesions are evident. Adrenals/Urinary Tract: Adrenals bilaterally appear unremarkable. Kidneys bilaterally show no evident mass or hydronephrosis. No renal or ureteral calculus. No hydronephrosis. Urinary bladder is midline with thickening of the urinary bladder wall. There is also mild soft tissue stranding surrounding the urinary bladder.  Stomach/Bowel: There is no appreciable bowel wall or mesenteric thickening. There is no evident bowel obstruction. No free air or portal venous air. Vascular/Lymphatic: There is atherosclerotic calcification throughout the aorta and iliac arteries. There is moderate narrowing of the aorta with what appears to be hemodynamically significant obstruction in both common femoral arteries. No aneurysm. Major mesenteric arterial vessels appear patent. There is extensive adenopathy throughout the abdomen. There are multiple enlarged lymph nodes in the peripancreatic region, largest measuring 1.7 x 1.5 cm. Scattered prominent mesenteric lymph nodes are noted throughout the abdomen. There is extensive retroperitoneal adenopathy. Largest retroperitoneal lymph node measures 2.5 x 1.5 cm. There is adenopathy to the right of the celiac axis measuring 2.3 x 1.5 cm. Adenopathy is seen in both external iliac node chains. The largest lymph node in this area on the right measures 2.6 x 1.7 cm. The largest lymph node in this region on the left measures 2.0 by 1.4 cm. There is also extensive adenopathy more posteriorly in the pelvis at the level of each acetabulum. The largest of these lymph nodes on the right measures 3.1 x 1.7 cm. Largest of these lymph nodes on the left measures 3.2 x 1.6 cm. There is adenopathy in both inguinal regions. Largest of these lymph nodes in the inguinal regions on the right measures 1.4 x 1.3 cm. Largest lymph node in the left inguinal region measures 1.8 x 1.6 cm. Reproductive: Uterus is apparently absent. No pelvic mass apart from adenopathy seen. Other: Appendix region appears normal. No abscess is evident in the abdomen or pelvis. There is mild ascites. Musculoskeletal: There are no appreciable blastic or lytic bone lesions. No intramuscular or abdominal wall lesions are evident. Review of the MIP images confirms the above findings. IMPRESSION: CT angiogram chest: 1. No demonstrable pulmonary embolus.  No thoracic aortic aneurysm or dissection. Multiple foci of aortic atherosclerosis noted as well as foci of calcification in great vessels and coronary arteries. 2. Extensive adenopathy at multiple sites as summarized above. This extensive adenopathy raises concern for potential lymphoma. Small cell lung carcinoma could present in this manner as well. 3. Moderate pleural effusions bilaterally, larger on the right than on the left, with compressive atelectasis in the lung bases. There is focal consolidation felt to represent pneumonia adjacent to the right heart border in the medial segment right middle lobe. 4. Partially calcified dominant mass right lobe of thyroid. This mass may warrant  nonemergent thyroid ultrasound to further evaluate. CT abdomen and pelvis: 1. Widespread abdominal and pelvic adenopathy. Splenomegaly. This combination of findings raises concern for lymphoma as most likely etiology. 2.  Mild ascites. 3. No abscess in the abdomen or pelvis. No periappendiceal region inflammation. 4. Extent extensive aortoiliac atherosclerosis with what appears to be hemodynamically significant obstruction in the common iliac arteries bilaterally. 5.  Uterus apparently absent. Aortic Atherosclerosis (ICD10-I70.0). Electronically Signed: By: Lowella Grip III M.D. On: 04/18/2018 08:41    Labs:  CBC: Recent Labs    04/21/18 0625 04/23/18 2030 04/25/18 0654 05/03/18 0533  WBC 6.9 7.7 7.3 8.1  HGB 11.4* 10.9* 10.2* 8.7*  HCT 35.6* 33.3* 31.0* 23.6*  PLT 204 178 138* 67*    COAGS: Recent Labs    04/21/18 0625  INR 1.10    BMP: Recent Labs    04/30/18 0655 05/01/18 0833 05/02/18 0659 05/03/18 0533  NA 127* 127* 125* 124*  K 3.8 3.8 3.7 3.9  CL 96* 93* 94* 92*  CO2 _0 GLUCOSE 140* 141* 141* 142*  BUN _1 23*  CALCIUM 8.5* 8.6* 8.4* 8.5*  CREATININE 0.87 0.96 1.05* 1.31*  GFRNONAA >60 >60 56* 43*  GFRAA >60 >60 >60 49*    LIVER FUNCTION TESTS: Recent Labs     04/30/18 0655 05/01/18 0833 05/02/18 0659 05/03/18 0533  BILITOT 0.9 0.8 1.1 1.1  AST _2 ALT 10* 13* 12* 12*  ALKPHOS 47 55 52 55  PROT 7.5 7.6 7.7 7.8  ALBUMIN 2.5* 2.6* 2.6* 2.5*    TUMOR MARKERS: No results for input(s): AFPTM, CEA, CA199, CHROMGRNA in the last 8760 hours.  Assessment and Plan:  Polylymphadenopathy. Plan for image-guided bone marrow biopsy potentially early next week. Patient will be NPO at midnight the night before the procedure. Denies fever and WBCs WNL. INR pending.  Risks and benefits discussed with the patient including, but not limited to bleeding, infection, damage to adjacent structures or low yield requiring additional tests. All of the patient's questions were answered, patient is agreeable to proceed. Consent signed and in chart.  Thank you for this interesting consult.  I greatly enjoyed meeting Deborah Cooley and look forward to participating in their care.  A copy of this report was sent to the requesting provider on this date.  Electronically Signed: Earley Abide, PA-C 05/03/2018, 11:37 AM   I spent a total of 20 Minutes in face to face in clinical consultation, greater than 50% of which was counseling/coordinating care for polylymphadenopathy.

## 2018-05-04 DIAGNOSIS — N179 Acute kidney failure, unspecified: Secondary | ICD-10-CM

## 2018-05-04 DIAGNOSIS — E872 Acidosis: Secondary | ICD-10-CM

## 2018-05-04 DIAGNOSIS — R609 Edema, unspecified: Secondary | ICD-10-CM

## 2018-05-04 DIAGNOSIS — R638 Other symptoms and signs concerning food and fluid intake: Secondary | ICD-10-CM

## 2018-05-04 LAB — CBC WITH DIFFERENTIAL/PLATELET
BASOS ABS: 0 10*3/uL (ref 0.0–0.1)
Basophils Relative: 0 %
Eosinophils Absolute: 0 10*3/uL (ref 0.0–0.7)
Eosinophils Relative: 0 %
HEMATOCRIT: 24.4 % — AB (ref 36.0–46.0)
Hemoglobin: 8.4 g/dL — ABNORMAL LOW (ref 12.0–15.0)
LYMPHS PCT: 22 %
Lymphs Abs: 1.5 10*3/uL (ref 0.7–4.0)
MCH: 30 pg (ref 26.0–34.0)
MCHC: 34.4 g/dL (ref 30.0–36.0)
MCV: 87.1 fL (ref 78.0–100.0)
MONOS PCT: 8 %
Monocytes Absolute: 0.5 10*3/uL (ref 0.1–1.0)
NEUTROS PCT: 70 %
Neutro Abs: 4.7 10*3/uL (ref 1.7–7.7)
PLATELETS: 68 10*3/uL — AB (ref 150–400)
RBC: 2.8 MIL/uL — AB (ref 3.87–5.11)
RDW: 16.1 % — ABNORMAL HIGH (ref 11.5–15.5)
WBC: 6.7 10*3/uL (ref 4.0–10.5)

## 2018-05-04 LAB — CHLORIDE, URINE, RANDOM: CHLORIDE URINE: 17 mmol/L

## 2018-05-04 LAB — BASIC METABOLIC PANEL
ANION GAP: 11 (ref 5–15)
BUN: 34 mg/dL — ABNORMAL HIGH (ref 6–20)
CHLORIDE: 94 mmol/L — AB (ref 101–111)
CO2: 19 mmol/L — ABNORMAL LOW (ref 22–32)
Calcium: 8.2 mg/dL — ABNORMAL LOW (ref 8.9–10.3)
Creatinine, Ser: 1.12 mg/dL — ABNORMAL HIGH (ref 0.44–1.00)
GFR calc non Af Amer: 52 mL/min — ABNORMAL LOW (ref >60)
GFR, EST AFRICAN AMERICAN: 60 mL/min — AB (ref >60)
Glucose, Bld: 173 mg/dL — ABNORMAL HIGH (ref 65–99)
POTASSIUM: 4.4 mmol/L (ref 3.5–5.1)
SODIUM: 124 mmol/L — AB (ref 135–145)

## 2018-05-04 LAB — NA AND K (SODIUM & POTASSIUM), RAND UR
POTASSIUM UR: 24 mmol/L
Sodium, Ur: 20 mmol/L

## 2018-05-04 LAB — HEPATITIS PANEL, ACUTE
Hep A IgM: NEGATIVE
Hep B C IgM: NEGATIVE
Hepatitis B Surface Ag: NEGATIVE

## 2018-05-04 MED ORDER — DICLOFENAC SODIUM 1 % TD GEL
2.0000 g | Freq: Four times a day (QID) | TRANSDERMAL | Status: DC | PRN
  Administered 2018-05-04 – 2018-05-05 (×3): 2 g via TOPICAL

## 2018-05-04 NOTE — Progress Notes (Signed)
Subjective: Deborah Cooley was seen sitting in her bedside chair. She states she feels a little better than yesterday and slept well since starting steroids. She continues to endorse dyspnea and cough. We continue to await second opinion on pathology results expect results early this week.     Objective:  Vital signs in last 24 hours: Vitals:   05/03/18 2051 05/03/18 2142 05/04/18 0203 05/04/18 0445  BP: (!) 94/53   (!) 117/59  Pulse: 94   93  Resp: (!) 23   18  Temp: 97.6 F (36.4 C)   (!) 97.5 F (36.4 C)  TempSrc: Axillary   Oral  SpO2: 97% 95%  95%  Weight:   142 lb 14.4 oz (64.8 kg)   Height:       Physical Exam  Constitutional: She is oriented to person, place, and time. She appears well-developed. No distress.  Chronically ill appearing  HENT:  Prominent cervical, axillary and peri-auricular lymphadenopathy.   Eyes: EOM are normal. Right eye exhibits no discharge. Left eye exhibits no discharge.  Cardiovascular: Regular rhythm, normal heart sounds and intact distal pulses.  Tachycardia  Pulmonary/Chest: Effort normal. No respiratory distress.  Diminished breath sound bilateral bases R>L  Abdominal: Bowel sounds are normal. She exhibits distension. There is tenderness.  Musculoskeletal: She exhibits edema. She exhibits no deformity.  Compression stockings on  Neurological: She is alert and oriented to person, place, and time.  Skin: Skin is warm and dry.   Assessment/Plan: 62 yo presented for evaluation of DOE found to have diffuse cervical, thoracic, and abdominal lymphadenopathy. 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   Diffuse lymphadenopathy concerning for T-cell Lymphoma Splenomegaly Bilateral pleural effusions, small Persistent DOE, continues to saturate well on RA. 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  > Exhibits edema, electrolyte abnormalities and hematologic abnormalities consistent lymphoma (capillary leak, bone marrow infiltration). > Bone marrow biopsy planned for early this week > Hepatitis panel, CMV, EBV pending > Will need vascular accessed placed for chemo therapy, likely a port-a-cath - Could consider therapeutic thoracentesis if pleural effusions were to enlarge/declining respiratory status - Bone marrow biopsy ordered; need to coordinate with IR and cytology. ?6/17 - PO Decadron 20 mg BID (have also added PO Benadryl 50 mg prn insomnia) - Allopurinol and IVF for prophylaxis of tumor lysis syndrome, Uric Acid 16.7 (05/03/18) considering  - Albuterol, supplemental O2 and robitussin-dm prn - Axillary node biopsy results pending - Continue PT  AKI NAGMA Hyponatremia: Patient with hyponatremia which is downtrending. Initially thought to be secondary to hypervolemia however did not improve with lasix. Suspect related to capillary leak; difficult to manage given underlying process.  > Renal function improving Cr 1.21 > Na stable 124 > NAGMA likely 2/2 dilution with initiation of IVF as above - Monitor AM BMET - Urine Studies - I&O, Daily Weights  Lower Extremity Edema: Stable. Improves some with elevation. Likely 2/2 low albumin and capillary leak. Volume overload treated with lasix with improvement in patient weight, lasix discontinued.  > Echocardiogram showed EF 60-65% and normal diastolic function - Elevate lower extremities - Strict I&O, Daily Weights - Compression stocking or ACE wrap - Continue to hold Lasix - AM BMP  Poor Oral intake: Stable. Decreased oral intake due to discomfort from enlarged lymph nodes. - Boost TID between  meals; Nutrition on  Subclinical hypothyroidism Calcified mass in R lobe of thyroid: Incidental CT finding, again noted on admission  CXR. Per CT Read: non-emergent follow up ultrasound for further evaluation. TSH elevated to 9.83. Free T4 lower end of normal at 0.99  Thrombocytopenia, Anemia: Likely secondary to bone marrow involvement. Holding Lovenox for now, especially given some hemoptysis yesterday morning; will follow and transfuse if needed.  > Hgb 8.7>>8.4 (with IVF running) - Daily CBC  FEN: Regular Diet VTE Prophylaxis: TED hose/SCDs Code Status: Full  Dispo: Anticipated discharge pending tissue diagnosis from biospy.   Neva Seat, MD 05/04/2018, 8:01 AM Pager: [phone]

## 2018-05-05 ENCOUNTER — Inpatient Hospital Stay (HOSPITAL_COMMUNITY): Payer: Self-pay

## 2018-05-05 ENCOUNTER — Encounter (HOSPITAL_COMMUNITY): Payer: Self-pay | Admitting: General Surgery

## 2018-05-05 DIAGNOSIS — F17210 Nicotine dependence, cigarettes, uncomplicated: Secondary | ICD-10-CM

## 2018-05-05 DIAGNOSIS — C9150 Adult T-cell lymphoma/leukemia (HTLV-1-associated) not having achieved remission: Secondary | ICD-10-CM

## 2018-05-05 LAB — DIRECT ANTIGLOBULIN TEST (NOT AT ARMC)
DAT, COMPLEMENT: POSITIVE
DAT, IGG: POSITIVE

## 2018-05-05 LAB — EPSTEIN-BARR VIRUS VCA ANTIBODY PANEL
EBV EARLY ANTIGEN AB, IGG: 11.2 U/mL — AB (ref 0.0–8.9)
EBV NA IgG: 179 U/mL — ABNORMAL HIGH (ref 0.0–17.9)
EBV VCA IGG: 227 U/mL — AB (ref 0.0–17.9)

## 2018-05-05 LAB — CBC
HCT: 22.7 % — ABNORMAL LOW (ref 36.0–46.0)
Hemoglobin: 7.7 g/dL — ABNORMAL LOW (ref 12.0–15.0)
MCH: 29.7 pg (ref 26.0–34.0)
MCHC: 33.9 g/dL (ref 30.0–36.0)
MCV: 87.6 fL (ref 78.0–100.0)
PLATELETS: 89 10*3/uL — AB (ref 150–400)
RBC: 2.59 MIL/uL — AB (ref 3.87–5.11)
RDW: 16 % — AB (ref 11.5–15.5)
WBC: 5.7 10*3/uL (ref 4.0–10.5)

## 2018-05-05 LAB — BASIC METABOLIC PANEL
Anion gap: 7 (ref 5–15)
BUN: 30 mg/dL — AB (ref 6–20)
CALCIUM: 8.1 mg/dL — AB (ref 8.9–10.3)
CO2: 21 mmol/L — ABNORMAL LOW (ref 22–32)
CREATININE: 0.86 mg/dL (ref 0.44–1.00)
Chloride: 98 mmol/L — ABNORMAL LOW (ref 101–111)
GFR calc Af Amer: >60 mL/min (ref >60)
Glucose, Bld: 201 mg/dL — ABNORMAL HIGH (ref 65–99)
Potassium: 4.4 mmol/L (ref 3.5–5.1)
Sodium: 126 mmol/L — ABNORMAL LOW (ref 135–145)

## 2018-05-05 LAB — CMV ANTIBODY, IGG (EIA): CMV Ab - IgG: 2.6 U/mL — ABNORMAL HIGH (ref 0.00–0.59)

## 2018-05-05 LAB — GLUCOSE, CAPILLARY
GLUCOSE-CAPILLARY: 230 mg/dL — AB (ref 65–99)
GLUCOSE-CAPILLARY: 276 mg/dL — AB (ref 65–99)

## 2018-05-05 LAB — CMV IGM: CMV IGM: 104 [AU]/ml — AB (ref 0.0–29.9)

## 2018-05-05 MED ORDER — INSULIN ASPART 100 UNIT/ML ~~LOC~~ SOLN
0.0000 [IU] | Freq: Three times a day (TID) | SUBCUTANEOUS | Status: DC
  Administered 2018-05-05: 3 [IU] via SUBCUTANEOUS
  Administered 2018-05-06: 2 [IU] via SUBCUTANEOUS
  Administered 2018-05-06: 3 [IU] via SUBCUTANEOUS
  Administered 2018-05-07 – 2018-05-08 (×2): 2 [IU] via SUBCUTANEOUS
  Administered 2018-05-08: 1 [IU] via SUBCUTANEOUS
  Administered 2018-05-08: 2 [IU] via SUBCUTANEOUS
  Administered 2018-05-09: 3 [IU] via SUBCUTANEOUS
  Administered 2018-05-09 (×2): 1 [IU] via SUBCUTANEOUS
  Administered 2018-05-10: 3 [IU] via SUBCUTANEOUS
  Administered 2018-05-10: 1 [IU] via SUBCUTANEOUS
  Administered 2018-05-10: 3 [IU] via SUBCUTANEOUS
  Administered 2018-05-11: 1 [IU] via SUBCUTANEOUS

## 2018-05-05 MED ORDER — FENTANYL CITRATE (PF) 100 MCG/2ML IJ SOLN
INTRAMUSCULAR | Status: AC | PRN
  Administered 2018-05-05: 50 ug via INTRAVENOUS
  Administered 2018-05-05: 25 ug via INTRAVENOUS

## 2018-05-05 MED ORDER — MIDAZOLAM HCL 2 MG/2ML IJ SOLN
INTRAMUSCULAR | Status: AC
  Filled 2018-05-05: qty 4

## 2018-05-05 MED ORDER — LIDOCAINE HCL 1 % IJ SOLN
INTRAMUSCULAR | Status: AC
  Filled 2018-05-05: qty 20

## 2018-05-05 MED ORDER — MIDAZOLAM HCL 2 MG/2ML IJ SOLN
INTRAMUSCULAR | Status: AC | PRN
  Administered 2018-05-05: 1 mg via INTRAVENOUS
  Administered 2018-05-05 (×2): 0.5 mg via INTRAVENOUS

## 2018-05-05 MED ORDER — FENTANYL CITRATE (PF) 100 MCG/2ML IJ SOLN
INTRAMUSCULAR | Status: AC
  Filled 2018-05-05: qty 4

## 2018-05-05 MED ORDER — INSULIN ASPART 100 UNIT/ML ~~LOC~~ SOLN
0.0000 [IU] | Freq: Every day | SUBCUTANEOUS | Status: DC
  Administered 2018-05-05: 3 [IU] via SUBCUTANEOUS
  Administered 2018-05-06: 0 [IU] via SUBCUTANEOUS
  Administered 2018-05-08 – 2018-05-09 (×2): 2 [IU] via SUBCUTANEOUS
  Administered 2018-05-10: 3 [IU] via SUBCUTANEOUS

## 2018-05-05 NOTE — Procedures (Signed)
BM aspirate and core EBL 0 Comp 0 

## 2018-05-05 NOTE — Progress Notes (Signed)
Physical Therapy Treatment Patient Details Name: Deborah Cooley MRN: [id number] DOB: 1956/10/16 Today's Date: 05/05/2018    History of Present Illness Pt is a 62 y.o. F with significant PMH of HTN, endometriosis s/p hysterectomy, tobaccos use and recent hospitalization for multifocal lymphadenopathy and concern for lymphoma who presents for eavluation of recurrent shortness of breath and abdominal pain.    PT Comments    Pt was seen for evaluation of movement and control of standing.  She is able to walk but during first half had to sit down with SOB and fear of losign balance.  Her sat was not able to be read until she returned to her room and stated 78%.   RT decided this was mobility and poor perfusion of fingers, as the monitor in her room stated 100% after reconnecting nasal cannula.  Follow for further progression of movement and balance on LE's   Follow Up Recommendations  Home health PT;Supervision for mobility/OOB     Equipment Recommendations  None recommended by PT    Recommendations for Other Services       Precautions / Restrictions Precautions Precautions: Fall Restrictions Weight Bearing Restrictions: No    Mobility  Bed Mobility               General bed mobility comments: pt up in chair when PT arrived  Transfers Overall transfer level: Needs assistance Equipment used: None Transfers: Sit to/from Stand Sit to Stand: Min guard         General transfer comment: safety and protectoin  Ambulation/Gait Ambulation/Gait assistance: Min guard Gait Distance (Feet): 190 Feet Assistive device: 1 person hand held assist Gait Pattern/deviations: Step-through pattern;Decreased stride length;Drifts right/left;Wide base of support Gait velocity: reduced Gait velocity interpretation: <1.31 ft/sec, indicative of household ambulator General Gait Details: stopped twice on hallway due to O2 sats dropping and thuee poor control of knees with fatigue   Stairs              Wheelchair Mobility    Modified Rankin (Stroke Patients Only)       Balance     Sitting balance-Leahy Scale: Good       Standing balance-Leahy Scale: Fair Standing balance comment: required HHA today, may need an AD                            Cognition Arousal/Alertness: Awake/alert Behavior During Therapy: WFL for tasks assessed/performed Overall Cognitive Status: Within Functional Limits for tasks assessed                                        Exercises      General Comments        Pertinent Vitals/Pain Pain Assessment: 0-10 Pain Score: 8  Pain Location: chest - from coughing Pain Descriptors / Indicators: Grimacing;Moaning Pain Intervention(s): Limited activity within patient's tolerance;Monitored during session;Repositioned(states she cannot have any meds for it)    Home Living                      Prior Function            PT Goals (current goals can now be found in the care plan section) Acute Rehab PT Goals Patient Stated Goal: to feel better and get stronger. Progress towards PT goals: Progressing toward goals    Frequency  Min 3X/week      PT Plan Current plan remains appropriate    Co-evaluation              AM-PAC PT "6 Clicks" Daily Activity  Outcome Measure  Difficulty turning over in bed (including adjusting bedclothes, sheets and blankets)?: None Difficulty moving from lying on back to sitting on the side of the bed? : A Little Difficulty sitting down on and standing up from a chair with arms (e.g., wheelchair, bedside commode, etc,.)?: A Little Help needed moving to and from a bed to chair (including a wheelchair)?: A Little Help needed walking in hospital room?: A Little Help needed climbing 3-5 steps with a railing? : A Lot 6 Click Score: 18    End of Session Equipment Utilized During Treatment: Gait belt Activity Tolerance: Patient tolerated treatment  well Patient left: in chair;with call bell/phone within reach;with chair alarm set Nurse Communication: Mobility status PT Visit Diagnosis: Unsteadiness on feet (R26.81);Difficulty in walking, not elsewhere classified (R26.2)     Time: 1829-9371 PT Time Calculation (min) (ACUTE ONLY): 21 min  Charges:  $Gait Training: 8-22 mins                    G Codes:  Functional Assessment Tool Used: AM-PAC 6 Clicks Basic Mobility     Ramond Dial 05/05/2018, 10:54 PM  Mee Hives, PT MS Acute Rehab Dept. Number: [facility]

## 2018-05-05 NOTE — Progress Notes (Signed)
Subjective: Deborah Cooley was seen sitting at the side of her bed this morning. She states that she feel okay this morning and is breathing better and has a little more energy. She states she had some difficulty sleeping because she has a cough and wasn't able to drink anything as she was NPO for her bone marrow biopsy this morning. It was confirmed to her that her biopsy would be this morning and that she would be seen by oncology today. She she was told she would be getting insulin as need as her blood sugar are starting to rise on steroids. We continue to await second opinion on pathology results expect results early this week.     Objective:  Vital signs in last 24 hours: Vitals:   05/04/18 2057 05/04/18 2154 05/05/18 0329 05/05/18 0539  BP:  (!) 165/67  (!) 120/58  Pulse:  97  92  Resp:  (!) 22  (!) 23  Temp:  (!) 97.5 F (36.4 C)  (!) 97.5 F (36.4 C)  TempSrc:  Oral    SpO2: 98% 95%  96%  Weight:   153 lb 7 oz (69.6 kg)   Height:       Physical Exam  Constitutional: She is oriented to person, place, and time. She appears well-developed. No distress.  Chronically ill appearing  HENT:  Prominent cervical, axillary and peri-auricular lymphadenopathy.   Eyes: EOM are normal. Right eye exhibits no discharge. Left eye exhibits no discharge.  Cardiovascular: Regular rhythm, normal heart sounds and intact distal pulses.  Tachycardia  Pulmonary/Chest: Effort normal. No respiratory distress.  Diminished breath sound bilateral bases R>L  Abdominal: Bowel sounds are normal. She exhibits distension. There is tenderness.  Musculoskeletal: She exhibits edema. She exhibits no deformity.  Compression stockings on  Neurological: She is alert and oriented to person, place, and time.  Skin: Skin is warm and dry.  Psychiatric: She has a normal mood and affect.   Assessment/Plan: 62 yo presented for evaluation of DOE found to have diffuse cervical, thoracic, and abdominal lymphadenopathy.  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   Diffuse lymphadenopathy concerning for T-cell Lymphoma Splenomegaly Bilateral pleural effusions, small Persistent DOE, continues to saturate well on RA. Final pathology report from the [facility] confirmed initial biopsy result suggesting angioimmunoblastic T-cell lymphoma. Exhibits edema, electrolyte abnormalities and hematologic abnormalities consistent lymphoma (capillary leak, bone marrow infiltration). 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  > Bone marrow biopsy performed this morning > Hepatitis panel negative > CMV, EBV pending > Will need port-a-cath placed, will discuss with oncology about when this needs to be done (here vs [facility] if transferring soon)  - Oncology Consulted, Appreciate their recommendations - Could consider therapeutic thoracentesis if pleural effusions were to enlarge/declining respiratory status - PO Decadron 20 mg BID (have also added PO Benadryl 50 mg prn insomnia) - Allopurinol and IVF for prophylaxis of tumor lysis syndrome, Uric Acid 16.7 (05/03/18)  - Albuterol, supplemental O2 and robitussin-dm prn - Continue PT  AKI NAGMA Hyponatremia: Patient with hyponatremia which is downtrending. Initially thought to be secondary to hypervolemia however did not improve with lasix. Suspect related to capillary leak; difficult to manage given underlying process.  > Renal function improving Cr 0.86 > Na improving at 126 > NAGMA likely 2/2 dilution with initiation of IVF as above - Monitor AM BMET - I&O, Daily Weights  Lower Extremity Edema: Stable. Improves some with elevation. Likely 2/2 low albumin and capillary leak. Volume overload treated with lasix with improvement in patient weight, lasix discontinued.  > Echocardiogram showed EF  60-65% and normal diastolic function - Elevate lower extremities - Strict I&O, Daily Weights - Compression stocking or ACE wrap - AM BMP  Poor Oral intake: Stable. Decreased oral intake due to discomfort from enlarged lymph nodes. - Boost TID between meals; Nutrition on  Subclinical hypothyroidism Calcified mass in R lobe of thyroid: Incidental CT finding, again noted on admission CXR. Per CT Read: non-emergent follow up ultrasound for further evaluation. TSH elevated to 9.83. Free T4 lower end of normal at 0.99  Thrombocytopenia, Anemia: Likely secondary to bone marrow involvement. Holding Lovenox for now, especially given some hemoptysis yesterday morning; will follow and transfuse if needed.  > Hgb 8.7>>8.4>>7.7 (with IVF running) - Daily CBC  FEN: Regular Diet VTE Prophylaxis: TED hose/SCDs Code Status: Full  Dispo: Anticipated discharge pending oncology evaluation for transfer to [facility].   Neva Seat, MD 05/05/2018, 8:37 AM Pager: [phone]

## 2018-05-05 NOTE — Progress Notes (Addendum)
HEMATOLOGY/ONCOLOGY CONSULTATION NOTE  Date of Service: 05/06/2018  Patient Care Team: Patient, No Pcp Per as PCP - General ([place]) Deborah Ivan, MD (Unknown Physician Specialty)  CHIEF COMPLAINTS/PURPOSE OF CONSULTATION:  Newly diagnosed angioimmunoblastic T cell lymphoma  HISTORY OF PRESENTING ILLNESS:   Deborah Cooley is a wonderful 62 y.o. female who has been referred to [place] by Dr Joni Reining for evaluation and management of her newly diagnosed angioimmunoblastic T cell lymphoma.   Patient has a h/o HTN, endometriosis, tobacco use who was admitted to the hospital with shortness of breath and was subsequently found to have diffuse lymphadenopathy. She was also found to have bilateral pleural effusions and has had a thoracentesis with pleural fluid demonstrating atypical lymphocytosis. She was discharged but returned to the ED with complaints of persisting SOB, abdominal swelling, and lower extremity swelling.   She has had a  CTA chest and CT abd/pelvis which showed no PE.but extensive LNadenopathy in the chest and throughout the abd with b/l pleural effusion and mild ascites. Also noted to have narrowing in b/l common iliac arteries.  Clinically she is noted to have b/l LE edema 1-2+  Most recent lab results (05/05/18) of CBC  is as follows: all values are WNL except for RBC at 2.59, HGB at 7.7, HCT at 22.7, RDW at 16.0, PLT at 89k.  She has had a CT bone marrow biopsy on 05/05/2018 -results pending. ECHO was done and showed  Systolic function was   normal. The estimated ejection fraction was in the range of 60%   to 65%. Wall motion was normal; there were no regional wall   motion abnormalities. Left ventricular diastolic function   parameters were normal.  Port a cath placement requested.  Patient was transferred to [facility] with her consent to receive C1 of chemotherapy as inpatient.  MEDICAL HISTORY:  Past Medical History:  Diagnosis Date  .  Bilateral pleural effusion 04/17/2018  . Endometriosis   . Essential hypertension, benign   . Family history of adverse reaction to anesthesia    "sister stops breathing I think" (04/18/2018)  . Heart murmur   . Mixed hyperlipidemia   . Pneumonia 04/17/2018    SURGICAL HISTORY: Past Surgical History:  Procedure Laterality Date  . ABDOMINAL HYSTERECTOMY  1970s/1980s   for endometriosis  . Arthroscopic right shoulder surgery    . AXILLARY LYMPH NODE BIOPSY Left 04/21/2018   Procedure: LEFT AXILLARY LYMPH NODE BIOPSY;  Surgeon: Kieth Brightly, Arta Bruce, MD;  Location: [facility];  Service: General;  Laterality: Left;  . CARPAL TUNNEL RELEASE Right   . GANGLION CYST EXCISION Right   . MULTIPLE TOOTH EXTRACTIONS    . OOPHORECTOMY  1997  . PARTIAL HYSTERECTOMY  1984  . Right hand surgery    . SHOULDER OPEN ROTATOR CUFF REPAIR Right   . VESICOVAGINAL FISTULA CLOSURE W/ TAH      SOCIAL HISTORY: Social History   Socioeconomic History  . Marital status: Married    Spouse name: Not on file  . Number of children: Not on file  . Years of education: Not on file  . Highest education level: Not on file  Occupational History  . Occupation: Social research officer, government    Comment: works at [employer]  . Financial resource strain: Not on file  . Food insecurity:    Worry: Not on file    Inability: Not on file  . Transportation needs:    Medical: Not on file  Non-medical: Not on file  Tobacco Use  . Smoking status: Current Every Day Smoker    Packs/day: 1.50    Years: 46.00    Pack years: 69.00    Types: Cigarettes  . Smokeless tobacco: Never Used  Substance and Sexual Activity  . Alcohol use: Not Currently    Comment: 04/18/2018 "quit years ago"  . Drug use: Not Currently    Comment: "back in my 76s"  . Sexual activity: Not Currently  Lifestyle  . Physical activity:    Days per week: Not on file    Minutes per session: Not on file  . Stress: Not on file  Relationships    . Social connections:    Talks on phone: Not on file    Gets together: Not on file    Attends religious service: Not on file    Active member of club or organization: Not on file    Attends meetings of clubs or organizations: Not on file    Relationship status: Not on file  . Intimate partner violence:    Fear of current or ex partner: Not on file    Emotionally abused: Not on file    Physically abused: Not on file    Forced sexual activity: Not on file  Other Topics Concern  . Not on file  Social History Narrative   ** Merged History Encounter **       Patient has one son whom is healthy.   Has a live in boyfriend.    FAMILY HISTORY: Family History  Problem Relation Age of Onset  . Diabetes Mother        age 56  . Heart attack Father        died age 10's  . Hypertension Father   . Cancer Sister        breast cancer diagonsed age 57 years  . Diabetes Unknown   . Heart disease Unknown        Female <55  . Arthritis Unknown   . Asthma Unknown   . Hyperlipidemia Other        several siblings  . Thyroid disease Sister        one sister with thyroid disease    ALLERGIES:  has No Known Allergies.  MEDICATIONS:  Current Facility-Administered Medications  Medication Dose Route Frequency Provider Last Rate Last Dose  . 0.9 %  sodium chloride infusion   Intravenous Continuous Annia Belt, MD   Stopped at 05/05/18 0930  . acetaminophen (TYLENOL) tablet 650 mg  650 mg Oral Q6H PRN Jule Ser, DO   650 mg at 05/03/18 2200   Or  . acetaminophen (TYLENOL) suppository 650 mg  650 mg Rectal Q6H PRN Jule Ser, DO      . albuterol (PROVENTIL) (2.5 MG/3ML) 0.083% nebulizer solution 2.5 mg  2.5 mg Nebulization Q4H PRN Neva Seat, MD   2.5 mg at 05/02/18 0447  . allopurinol (ZYLOPRIM) tablet 300 mg  300 mg Oral Daily Annia Belt, MD   300 mg at 05/05/18 1255  . dexamethasone (DECADRON) tablet 20 mg  20 mg Oral Q12H Molt, Bethany, DO   20 mg at  05/05/18 1253  . diclofenac sodium (VOLTAREN) 1 % transdermal gel 2 g  2 g Topical QID PRN Neva Seat, MD   2 g at 05/04/18 2209  . diphenhydrAMINE (BENADRYL) capsule 50 mg  50 mg Oral QHS PRN Molt, Bethany, DO   50 mg at 05/04/18 2208  . feeding  supplement (BOOST / RESOURCE BREEZE) liquid 1 Container  1 Container Oral TID BM Joni Reining C, DO   1 Container at 05/05/18 1500  . fentaNYL (SUBLIMAZE) 100 MCG/2ML injection           . guaiFENesin-dextromethorphan (ROBITUSSIN DM) 100-10 MG/5ML syrup 5 mL  5 mL Oral Q4H PRN Sid Falcon, MD   5 mL at 05/05/18 0033  . insulin aspart (novoLOG) injection 0-5 Units  0-5 Units Subcutaneous QHS Neva Seat, MD      . insulin aspart (novoLOG) injection 0-9 Units  0-9 Units Subcutaneous TID WC Neva Seat, MD      . ipratropium-albuterol (DUONEB) 0.5-2.5 (3) MG/3ML nebulizer solution 3 mL  3 mL Nebulization BID Raelene Bott, MD   3 mL at 05/05/18 0930  . lidocaine (XYLOCAINE) 1 % (with pres) injection           . midazolam (VERSED) 2 MG/2ML injection           . ondansetron (ZOFRAN) tablet 4 mg  4 mg Oral Q6H PRN Jule Ser, DO       Or  . ondansetron [facility] injection 4 mg  4 mg Intravenous Q6H PRN Jule Ser, DO      . polyethylene glycol (MIRALAX / GLYCOLAX) packet 17 g  17 g Oral Daily PRN Jule Ser, DO      . ramelteon (ROZEREM) tablet 8 mg  8 mg Oral Fritzi Mandes, MD   8 mg at 05/04/18 2208  . sodium chloride flush (NS) 0.9 % injection 3 mL  3 mL Intravenous Q12H Jule Ser, DO   3 mL at 05/05/18 1255    REVIEW OF SYSTEMS:    10 Point review of Systems was done is negative except as noted above.  PHYSICAL EXAMINATION: ECOG PERFORMANCE STATUS: 2 - Symptomatic, <50% confined to bed  . Vitals:   05/05/18 1250 05/05/18 1258  BP:    Pulse:    Resp:    Temp:    SpO2: (!) 87% 97%   Filed Weights   05/03/18 0500 05/04/18 0203 05/05/18 0329  Weight: 144 lb 2.9 oz (65.4 kg) 142 lb 14.4 oz  (64.8 kg) 153 lb 7 oz (69.6 kg)   .Body mass index is 28.06 kg/m.  GENERAL:alert, in no acute distress and comfortable SKIN: no acute rashes, no significant lesions EYES: conjunctiva are pink and non-injected, sclera anicteric OROPHARYNX: MMM, no exudates NECK: supple, mild JVD LYMPH + axillary LN LUNGS: decreased air entry b/l lung bases. HEART: regular rate & rhythm ABDOMEN: splenomegaly 3-4 finger below left MCL, no g/r/t Extremity: 2+pedal edema PSYCH: alert & oriented x 3 with fluent speech NEURO: no focal motor/sensory deficits  LABORATORY DATA:  I have reviewed the data as listed  . CBC Latest Ref Rng & Units 05/06/2018 05/05/2018 05/04/2018  WBC 4.0 - 10.5 K/uL 5.7 5.7 6.7  Hemoglobin 12.0 - 15.0 g/dL 7.7(L) 7.7(L) 8.4(L)  Hematocrit 36.0 - 46.0 % 22.7(L) 22.7(L) 24.4(L)  Platelets 150 - 400 K/uL 118(L) 89(L) 68(L)    . CMP Latest Ref Rng & Units 05/06/2018 05/05/2018 05/04/2018  Glucose 65 - 99 mg/dL 189(H) 201(H) 173(H)  BUN 6 - 20 mg/dL 29(H) 30(H) 34(H)  Creatinine 0.44 - 1.00 mg/dL 0.72 0.86 1.12(H)  Sodium 135 - 145 mmol/L 129(L) 126(L) 124(L)  Potassium 3.5 - 5.1 mmol/L 4.7 4.4 4.4  Chloride 101 - 111 mmol/L 100(L) 98(L) 94(L)  CO2 22 - 32 mmol/L 22 21(L) 19(L)  Calcium 8.9 -  10.3 mg/dL 8.4(L) 8.1(L) 8.2(L)  Total Protein 6.5 - 8.1 g/dL - - -  Total Bilirubin 0.3 - 1.2 mg/dL - - -  Alkaline Phos 38 - 126 U/L - - -  AST 15 - 41 U/L - - -  ALT 14 - 54 U/L - - -    . Lab Results  Component Value Date   LDH 322 (H) 05/03/2018    Component     Latest Ref Rng & Units 04/18/2018 05/03/2018  Hepatitis B Surface Ag     Negative  Negative  HCV Ab     0.0 - 0.9 s/co ratio  <0.1  Hep A Ab, IgM     Negative  Negative  Hep B Core Ab, IgM     Negative  Negative  HIV Screen 4th Generation wRfx     Non Reactive Non Reactive     04/21/18 Molecular Pathology:   04/21/18 Surgical Pathology:    RADIOGRAPHIC STUDIES: I have personally reviewed the radiological  images as listed and agreed with the findings in the report. Dg Chest 2 View  Result Date: 04/23/2018 CLINICAL DATA:  Recent thoracentesis, worsening shortness of breath would like swelling. Productive cough. History of pleural effusion, pneumonia, left axillary node biopsy April 21, 2018. EXAM: CHEST - 2 VIEW COMPARISON:  Chest radiograph April 19, 2018 and CT chest Apr 18, 2018 FINDINGS: Increasing small bilateral pleural effusions. Diffuse interstitial prominence. Increased lung volumes and flattened hemidiaphragms. Worsening patchy right middle lobe airspace opacity. Left lung base atelectasis. Cardiac silhouette mildly enlarged. Calcified aortic arch. No pneumothorax. Calcified right thyroid nodule. Soft tissue planes and included osseous structures are not suspicious. IMPRESSION: Increasing small bilateral pleural effusions. Worsening right middle lobe airspace opacity with left lung base atelectasis. Mild cardiomegaly.  COPD. Aortic Atherosclerosis (ICD10-I70.0). Electronically Signed   By: Elon Alas M.D.   On: 04/23/2018 21:15   Dg Chest 2 View  Result Date: 04/19/2018 CLINICAL DATA:  Productive cough and shortness of breath for 1 week. EXAM: CHEST - 2 VIEW COMPARISON:  PA and lateral chest 04/17/2018. CT chest, abdomen and pelvis 04/18/2018. FINDINGS: Small bilateral pleural effusions are seen, greater on the right. Basilar atelectasis is noted. No consolidative process or pneumothorax. Heart size is normal. Aortic atherosclerosis is noted. Calcified lesion in the right lobe of the thyroid is noted. No acute bony abnormality. IMPRESSION: Small bilateral pleural effusions and mild basilar atelectasis. Atherosclerosis. Electronically Signed   By: Inge Rise M.D.   On: 04/19/2018 13:55   Dg Chest 2 View  Result Date: 04/17/2018 CLINICAL DATA:  Abdominal pain.  Shortness of breath. EXAM: CHEST - 2 VIEW COMPARISON:  None. FINDINGS: The heart size is normal. The hila and mediastinum are  unremarkable. A 2.2 cm eggshell calcification is seen in the right thoracic inlet. No pneumothorax. Mild interstitial prominence bilaterally. Small bilateral pleural effusions. No nodules or masses. No other acute abnormalities. IMPRESSION: 1. Mild interstitial opacities in the lungs are nonspecific. Edema could have this appearance but the lack of cardiomegaly would be unusual. Atypical infection is possible. 2. Small bilateral effusions. 3. The eggshell calcification in the right thoracic inlet is nonspecific. This could be a vascular structure or a calcified nodule in the thyroid. Electronically Signed   By: Dorise Bullion III M.D   On: 04/17/2018 23:32   Ct Angio Chest Pe W/cm &/or Wo Cm  Addendum Date: 04/18/2018   ADDENDUM REPORT: 04/18/2018 08:59 ADDENDUM: Add to IMPRESSION: There is wall thickening in the  urinary bladder with mild soft tissue stranding adjacent to the bladder. Suspect a degree of cystitis. Electronically Signed   By: Lowella Grip III M.D.   On: 04/18/2018 08:59   Result Date: 04/18/2018 CLINICAL DATA:  Shortness of breath.  Abdominal pain EXAM: CT ANGIOGRAPHY CHEST CT ABDOMEN AND PELVIS WITH CONTRAST TECHNIQUE: Multidetector CT imaging of the chest was performed using the standard protocol during bolus administration of intravenous contrast. Multiplanar CT image reconstructions and MIPs were obtained to evaluate the vascular anatomy. Multidetector CT imaging of the abdomen and pelvis was performed using the standard protocol during bolus administration of intravenous contrast. CONTRAST:  174m ISOVUE-370 IOPAMIDOL (ISOVUE-370) INJECTION 76% COMPARISON:  Chest radiograph Apr 17, 2018 FINDINGS: CTA CHEST FINDINGS Cardiovascular: There is no demonstrable pulmonary embolus. There is no thoracic aortic aneurysm or dissection. There are foci of calcification in the proximal visualized great vessels. There is atherosclerotic calcification in the thoracic aorta. There are scattered foci  of coronary artery calcification. There is a small pericardial effusion. Mediastinum/Nodes: There is a partially calcified mass arising from the right lobe of the thyroid measuring 2.7 x 1.9 cm. Thyroid otherwise appears unremarkable. There is extensive adenopathy throughout the chest. There are multiple supraclavicular lymph nodes, largest measuring 1.0 x 0.9 cm. There are multiple axillary lymph nodes. The largest axillary lymph node on the left measures 2.5 x 2.1 cm cm. The largest axillary lymph node on the right measures 2.3 x 2.0 cm. There is adenopathy throughout the mediastinum. There are multiple aortopulmonary window region lymph nodes, largest measuring 1.4 x 1.4 cm. There is extensive adenopathy in the mediastinum surrounding the trachea and carina. There is a right pretracheal lymph node measuring 1.9 x 1.8 cm. There is a lymph node to the left of the origin of the carina measuring 1.6 x 1.5 cm. There is extensive adenopathy in the right hilar region. The largest individual lymph node in the right hilum measures 2.2 x 1.4 cm. There are multiple left hilar lymph nodes. The largest left hilar lymph node measures 1.8 x 1.3 cm. There is extensive subcarinal adenopathy. Largest subcarinal lymph node measures 2.8 x 2.4 cm. There are retrocrural lymph nodes bilaterally, largest on the left measuring 1.3 x 1.0 cm. There are several small lymph nodes at the level of the right pericardiophrenic angle. No esophageal lesions are evident. Lungs/Pleura: There are free-flowing pleural effusions bilaterally, larger on the right than on the left. There is consolidation in both lung bases, likely primarily due to compressive atelectasis. There is atelectatic change with volume loss in the right middle lobe medially with focal consolidation adjacent to the right heart border in the medial segment right middle lobe. No pulmonary nodular type lesion is evident on this study. Musculoskeletal: There are no blastic or lytic  bone lesions. No chest wall lesions are evident. Several benign-appearing calcifications are noted in the right breast. Review of the MIP images confirms the above findings. CT ABDOMEN and PELVIS FINDINGS Hepatobiliary: No focal liver lesions are appreciable. There is no appreciable gallbladder wall thickening. No biliary duct dilatation. Pancreas: No pancreatic mass or inflammatory focus. Extensive peripancreatic adenopathy is noted. Spleen: Spleen measures 15.0 x 12.4 x 6.8 cm with a measured splenic volume of 632 cubic cm. No focal splenic lesions are evident. Adrenals/Urinary Tract: Adrenals bilaterally appear unremarkable. Kidneys bilaterally show no evident mass or hydronephrosis. No renal or ureteral calculus. No hydronephrosis. Urinary bladder is midline with thickening of the urinary bladder wall. There is also mild soft  tissue stranding surrounding the urinary bladder. Stomach/Bowel: There is no appreciable bowel wall or mesenteric thickening. There is no evident bowel obstruction. No free air or portal venous air. Vascular/Lymphatic: There is atherosclerotic calcification throughout the aorta and iliac arteries. There is moderate narrowing of the aorta with what appears to be hemodynamically significant obstruction in both common femoral arteries. No aneurysm. Major mesenteric arterial vessels appear patent. There is extensive adenopathy throughout the abdomen. There are multiple enlarged lymph nodes in the peripancreatic region, largest measuring 1.7 x 1.5 cm. Scattered prominent mesenteric lymph nodes are noted throughout the abdomen. There is extensive retroperitoneal adenopathy. Largest retroperitoneal lymph node measures 2.5 x 1.5 cm. There is adenopathy to the right of the celiac axis measuring 2.3 x 1.5 cm. Adenopathy is seen in both external iliac node chains. The largest lymph node in this area on the right measures 2.6 x 1.7 cm. The largest lymph node in this region on the left measures 2.0 by  1.4 cm. There is also extensive adenopathy more posteriorly in the pelvis at the level of each acetabulum. The largest of these lymph nodes on the right measures 3.1 x 1.7 cm. Largest of these lymph nodes on the left measures 3.2 x 1.6 cm. There is adenopathy in both inguinal regions. Largest of these lymph nodes in the inguinal regions on the right measures 1.4 x 1.3 cm. Largest lymph node in the left inguinal region measures 1.8 x 1.6 cm. Reproductive: Uterus is apparently absent. No pelvic mass apart from adenopathy seen. Other: Appendix region appears normal. No abscess is evident in the abdomen or pelvis. There is mild ascites. Musculoskeletal: There are no appreciable blastic or lytic bone lesions. No intramuscular or abdominal wall lesions are evident. Review of the MIP images confirms the above findings. IMPRESSION: CT angiogram chest: 1. No demonstrable pulmonary embolus. No thoracic aortic aneurysm or dissection. Multiple foci of aortic atherosclerosis noted as well as foci of calcification in great vessels and coronary arteries. 2. Extensive adenopathy at multiple sites as summarized above. This extensive adenopathy raises concern for potential lymphoma. Small cell lung carcinoma could present in this manner as well. 3. Moderate pleural effusions bilaterally, larger on the right than on the left, with compressive atelectasis in the lung bases. There is focal consolidation felt to represent pneumonia adjacent to the right heart border in the medial segment right middle lobe. 4. Partially calcified dominant mass right lobe of thyroid. This mass may warrant nonemergent thyroid ultrasound to further evaluate. CT abdomen and pelvis: 1. Widespread abdominal and pelvic adenopathy. Splenomegaly. This combination of findings raises concern for lymphoma as most likely etiology. 2.  Mild ascites. 3. No abscess in the abdomen or pelvis. No periappendiceal region inflammation. 4. Extent extensive aortoiliac  atherosclerosis with what appears to be hemodynamically significant obstruction in the common iliac arteries bilaterally. 5.  Uterus apparently absent. Aortic Atherosclerosis (ICD10-I70.0). Electronically Signed: By: Lowella Grip III M.D. On: 04/18/2018 08:41   Ct Abdomen Pelvis W Contrast  Addendum Date: 04/18/2018   ADDENDUM REPORT: 04/18/2018 08:59 ADDENDUM: Add to IMPRESSION: There is wall thickening in the urinary bladder with mild soft tissue stranding adjacent to the bladder. Suspect a degree of cystitis. Electronically Signed   By: Lowella Grip III M.D.   On: 04/18/2018 08:59   Result Date: 04/18/2018 CLINICAL DATA:  Shortness of breath.  Abdominal pain EXAM: CT ANGIOGRAPHY CHEST CT ABDOMEN AND PELVIS WITH CONTRAST TECHNIQUE: Multidetector CT imaging of the chest was performed using the standard  protocol during bolus administration of intravenous contrast. Multiplanar CT image reconstructions and MIPs were obtained to evaluate the vascular anatomy. Multidetector CT imaging of the abdomen and pelvis was performed using the standard protocol during bolus administration of intravenous contrast. CONTRAST:  114m ISOVUE-370 IOPAMIDOL (ISOVUE-370) INJECTION 76% COMPARISON:  Chest radiograph Apr 17, 2018 FINDINGS: CTA CHEST FINDINGS Cardiovascular: There is no demonstrable pulmonary embolus. There is no thoracic aortic aneurysm or dissection. There are foci of calcification in the proximal visualized great vessels. There is atherosclerotic calcification in the thoracic aorta. There are scattered foci of coronary artery calcification. There is a small pericardial effusion. Mediastinum/Nodes: There is a partially calcified mass arising from the right lobe of the thyroid measuring 2.7 x 1.9 cm. Thyroid otherwise appears unremarkable. There is extensive adenopathy throughout the chest. There are multiple supraclavicular lymph nodes, largest measuring 1.0 x 0.9 cm. There are multiple axillary lymph  nodes. The largest axillary lymph node on the left measures 2.5 x 2.1 cm cm. The largest axillary lymph node on the right measures 2.3 x 2.0 cm. There is adenopathy throughout the mediastinum. There are multiple aortopulmonary window region lymph nodes, largest measuring 1.4 x 1.4 cm. There is extensive adenopathy in the mediastinum surrounding the trachea and carina. There is a right pretracheal lymph node measuring 1.9 x 1.8 cm. There is a lymph node to the left of the origin of the carina measuring 1.6 x 1.5 cm. There is extensive adenopathy in the right hilar region. The largest individual lymph node in the right hilum measures 2.2 x 1.4 cm. There are multiple left hilar lymph nodes. The largest left hilar lymph node measures 1.8 x 1.3 cm. There is extensive subcarinal adenopathy. Largest subcarinal lymph node measures 2.8 x 2.4 cm. There are retrocrural lymph nodes bilaterally, largest on the left measuring 1.3 x 1.0 cm. There are several small lymph nodes at the level of the right pericardiophrenic angle. No esophageal lesions are evident. Lungs/Pleura: There are free-flowing pleural effusions bilaterally, larger on the right than on the left. There is consolidation in both lung bases, likely primarily due to compressive atelectasis. There is atelectatic change with volume loss in the right middle lobe medially with focal consolidation adjacent to the right heart border in the medial segment right middle lobe. No pulmonary nodular type lesion is evident on this study. Musculoskeletal: There are no blastic or lytic bone lesions. No chest wall lesions are evident. Several benign-appearing calcifications are noted in the right breast. Review of the MIP images confirms the above findings. CT ABDOMEN and PELVIS FINDINGS Hepatobiliary: No focal liver lesions are appreciable. There is no appreciable gallbladder wall thickening. No biliary duct dilatation. Pancreas: No pancreatic mass or inflammatory focus. Extensive  peripancreatic adenopathy is noted. Spleen: Spleen measures 15.0 x 12.4 x 6.8 cm with a measured splenic volume of 632 cubic cm. No focal splenic lesions are evident. Adrenals/Urinary Tract: Adrenals bilaterally appear unremarkable. Kidneys bilaterally show no evident mass or hydronephrosis. No renal or ureteral calculus. No hydronephrosis. Urinary bladder is midline with thickening of the urinary bladder wall. There is also mild soft tissue stranding surrounding the urinary bladder. Stomach/Bowel: There is no appreciable bowel wall or mesenteric thickening. There is no evident bowel obstruction. No free air or portal venous air. Vascular/Lymphatic: There is atherosclerotic calcification throughout the aorta and iliac arteries. There is moderate narrowing of the aorta with what appears to be hemodynamically significant obstruction in both common femoral arteries. No aneurysm. Major mesenteric arterial vessels appear  patent. There is extensive adenopathy throughout the abdomen. There are multiple enlarged lymph nodes in the peripancreatic region, largest measuring 1.7 x 1.5 cm. Scattered prominent mesenteric lymph nodes are noted throughout the abdomen. There is extensive retroperitoneal adenopathy. Largest retroperitoneal lymph node measures 2.5 x 1.5 cm. There is adenopathy to the right of the celiac axis measuring 2.3 x 1.5 cm. Adenopathy is seen in both external iliac node chains. The largest lymph node in this area on the right measures 2.6 x 1.7 cm. The largest lymph node in this region on the left measures 2.0 by 1.4 cm. There is also extensive adenopathy more posteriorly in the pelvis at the level of each acetabulum. The largest of these lymph nodes on the right measures 3.1 x 1.7 cm. Largest of these lymph nodes on the left measures 3.2 x 1.6 cm. There is adenopathy in both inguinal regions. Largest of these lymph nodes in the inguinal regions on the right measures 1.4 x 1.3 cm. Largest lymph node in the  left inguinal region measures 1.8 x 1.6 cm. Reproductive: Uterus is apparently absent. No pelvic mass apart from adenopathy seen. Other: Appendix region appears normal. No abscess is evident in the abdomen or pelvis. There is mild ascites. Musculoskeletal: There are no appreciable blastic or lytic bone lesions. No intramuscular or abdominal wall lesions are evident. Review of the MIP images confirms the above findings. IMPRESSION: CT angiogram chest: 1. No demonstrable pulmonary embolus. No thoracic aortic aneurysm or dissection. Multiple foci of aortic atherosclerosis noted as well as foci of calcification in great vessels and coronary arteries. 2. Extensive adenopathy at multiple sites as summarized above. This extensive adenopathy raises concern for potential lymphoma. Small cell lung carcinoma could present in this manner as well. 3. Moderate pleural effusions bilaterally, larger on the right than on the left, with compressive atelectasis in the lung bases. There is focal consolidation felt to represent pneumonia adjacent to the right heart border in the medial segment right middle lobe. 4. Partially calcified dominant mass right lobe of thyroid. This mass may warrant nonemergent thyroid ultrasound to further evaluate. CT abdomen and pelvis: 1. Widespread abdominal and pelvic adenopathy. Splenomegaly. This combination of findings raises concern for lymphoma as most likely etiology. 2.  Mild ascites. 3. No abscess in the abdomen or pelvis. No periappendiceal region inflammation. 4. Extent extensive aortoiliac atherosclerosis with what appears to be hemodynamically significant obstruction in the common iliac arteries bilaterally. 5.  Uterus apparently absent. Aortic Atherosclerosis (ICD10-I70.0). Electronically Signed: By: Lowella Grip III M.D. On: 04/18/2018 08:41   Ct Bone Marrow Biopsy & Aspiration  Result Date: 05/05/2018 INDICATION: Lymphoma EXAM: CT BONE MARROW BIOPSY AND ASPIRATION MEDICATIONS:  None. ANESTHESIA/SEDATION: Fentanyl 75 mcg IV; Versed 2 mg IV Moderate Sedation Time:  32 minutes The patient was continuously monitored during the procedure by the interventional radiology nurse under my direct supervision. FLUOROSCOPY TIME:  Fluoroscopy Time:  minutes  seconds ( mGy). COMPLICATIONS: None immediate. PROCEDURE: Informed written consent was obtained from the patient after a thorough discussion of the procedural risks, benefits and alternatives. All questions were addressed. Maximal Sterile Barrier Technique was utilized including caps, mask, sterile gowns, sterile gloves, sterile drape, hand hygiene and skin antiseptic. A timeout was performed prior to the initiation of the procedure. Under CT guidance, a(n) 11 gauge guide needle was advanced into the right iliac bone. Aspirates and a core were obtained. Post biopsy images demonstrate . Patient tolerated the procedure well without complication. Vital sign monitoring by  nursing staff during the procedure will continue as patient is in the special procedures unit for post procedure observation. FINDINGS: The images document guide needle placement within the right iliac bone. Post biopsy images demonstrate no hemorrhage. IMPRESSION: Successful CT-guided right iliac bone marrow aspirate and core. Electronically Signed   By: Marybelle Killings M.D.   On: 05/05/2018 13:33    ASSESSMENT & PLAN:  62 y.o. female with  1. 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 General LNadenopathy from lymphoma with b/l pleural effusion and b/l lower extremity weakness  3. Anemia/thrombocytopenia - likely from Bone marrow involvement by lymphoma -pending BM Bx results  PLAN -I discussed the patients new diagnosis of NHL , natural history, staging, treatment options and rationale and potential adverse effects from chemotherapy -port a cath placement -requested and consenting patient. -transfuse prbc x 2 to maintain hgb >9 for  chemotherapy -ECHO done normal EF -on high dose dexamethasone - which should be discontinued and switched to Prednisone with treatment. -on Allopurinol for TLS prophylaxis -nutritional consultation to optimize po intake -will plan to treat with CHOP - C1 in the hospital -- will consider switching to Brentuximab + CHP as outpatient from C2. -will need outpatient PET/CT for baseline and response to treatment assessment. -PT/OT evaluation.  4.  Patient Active Problem List   Diagnosis Date Noted  . 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 Thrombocytopenia ([place]) 05/06/2018  . Costochondritis 04/29/2018  . Subclinical hypothyroidism 04/29/2018  . Hyponatremia 04/29/2018  . Dyspnea 04/26/2018  . Diffuse lymphadenopathy 04/21/2018  . Angioimmunoblastic lymphoma ([place]) 04/18/2018  . Tobacco use 04/18/2018  . Pleural effusion   . Essential hypertension, benign 05/21/2011  . Tobacco abuse 07/12/2009  . Atypical chest pain 07/12/2009  -mx per PCP   All of the patients questions were answered with apparent satisfaction. The patient knows to call the clinic with any problems, questions or concerns.  The total time spent in the appt was 80 minutes and more than 50% was on counseling and direct patient cares.co-ordinating transfer to [facility] and discussing with oncology nursing and hospitalist.    Sullivan Lone MD [place] AAHIVMS [facility] Hematology/Oncology Physician [facility]  (Office):       [phone] (Work cell):  [phone] (Fax):           [phone]

## 2018-05-05 NOTE — Progress Notes (Addendum)
PT returned from IR at 1137. Biopsy site c/d/i. Family at bedside asked about seeing the doctor. Pt and family educated about orders for bedrest until 1230   1258 - Pt removed nasal canula to take pills. Sat up and became dyspnic with effort of pulling herself up. SpO2 sensor dipped to 87% on room air. With 2LO2, came back up to 97%

## 2018-05-06 ENCOUNTER — Other Ambulatory Visit (HOSPITAL_COMMUNITY): Payer: Self-pay

## 2018-05-06 DIAGNOSIS — D696 Thrombocytopenia, unspecified: Secondary | ICD-10-CM

## 2018-05-06 DIAGNOSIS — D630 Anemia in neoplastic disease: Secondary | ICD-10-CM

## 2018-05-06 DIAGNOSIS — C865 Angioimmunoblastic T-cell lymphoma: Principal | ICD-10-CM

## 2018-05-06 LAB — BASIC METABOLIC PANEL
Anion gap: 7 (ref 5–15)
BUN: 29 mg/dL — AB (ref 6–20)
CHLORIDE: 100 mmol/L — AB (ref 101–111)
CO2: 22 mmol/L (ref 22–32)
CREATININE: 0.72 mg/dL (ref 0.44–1.00)
Calcium: 8.4 mg/dL — ABNORMAL LOW (ref 8.9–10.3)
GFR calc non Af Amer: >60 mL/min (ref >60)
Glucose, Bld: 189 mg/dL — ABNORMAL HIGH (ref 65–99)
POTASSIUM: 4.7 mmol/L (ref 3.5–5.1)
SODIUM: 129 mmol/L — AB (ref 135–145)

## 2018-05-06 LAB — TYPE AND SCREEN
ABO/RH(D): O POS
Antibody Screen: POSITIVE

## 2018-05-06 LAB — GLUCOSE, CAPILLARY
GLUCOSE-CAPILLARY: 173 mg/dL — AB (ref 65–99)
GLUCOSE-CAPILLARY: 198 mg/dL — AB (ref 65–99)
GLUCOSE-CAPILLARY: 165 mg/dL — AB (ref 65–99)
Glucose-Capillary: 202 mg/dL — ABNORMAL HIGH (ref 65–99)

## 2018-05-06 LAB — CBC
HEMATOCRIT: 22.7 % — AB (ref 36.0–46.0)
HEMOGLOBIN: 7.7 g/dL — AB (ref 12.0–15.0)
MCH: 31.7 pg (ref 26.0–34.0)
MCHC: 33.9 g/dL (ref 30.0–36.0)
MCV: 93.4 fL (ref 78.0–100.0)
Platelets: 118 10*3/uL — ABNORMAL LOW (ref 150–400)
RBC: 2.43 MIL/uL — ABNORMAL LOW (ref 3.87–5.11)
RDW: 19.7 % — ABNORMAL HIGH (ref 11.5–15.5)
WBC: 5.7 10*3/uL (ref 4.0–10.5)

## 2018-05-06 LAB — PREPARE RBC (CROSSMATCH)

## 2018-05-06 MED ORDER — SODIUM CHLORIDE 0.9% IV SOLUTION: Freq: Once | INTRAVENOUS | Status: AC

## 2018-05-06 MED ORDER — SODIUM CHLORIDE 0.9% IV SOLUTION
Freq: Once | INTRAVENOUS | Status: AC
  Administered 2018-05-11: 13:00:00 via INTRAVENOUS

## 2018-05-06 NOTE — Progress Notes (Signed)
Patient ID: Deborah Cooley, female   DOB: 07/09/1956, 62 y.o.   MRN: [id number] Pt s/p rt iliac BM bx yesterday at [facility]; has had leakage of serous fluid from puncture site since that time; on exam puncture site ok without sig erythema or hematoma; when pt stands there is slow ooze of serous fluid from site; imaging studies were reviewed by Dr. Pascal Lux and it appears this is most likely from body wall edema/anasarca; options for treatment include prn dressing changes until resolution vs small ostomy bag application to prevent clothing saturation. Above d/w nurse/pt.

## 2018-05-06 NOTE — Progress Notes (Signed)
62 year old female admitted to [facility] 04/23/2018 with diffuse lymphadenopathy status post biopsy concerning for lymphoproliferative process T-cell lymphoma confirmed by pathology being transferred to [facility] for chemotherapy and further oncology work-up.  Patient has been seen at [facility] by oncologist Dr. Irene Limbo.  Patient needs a Port-A-Cath placed at [facility].  She has been started on allopurinol, Decadron, IV fluids for prophylaxis of tumor lysis syndrome her uric acid level is 16.7 on 05/03/2018.  Patient will also need blood transfusion hemoglobin 7.7 this morning oncology recommends to keep hemoglobin above 9.  Will arrange for blood transfusion once the patient gets here.  [facility] will pick up the patient 05/07/2018.

## 2018-05-06 NOTE — Progress Notes (Signed)
Nutrition Follow-up  DOCUMENTATION CODES:   Not applicable  INTERVENTION:  Continue Boost Breeze po TID, each supplement provides 250 kcal and 9 grams of protein  Add MVI  Family bringing snacks related to patients food preferences  NUTRITION DIAGNOSIS:   Inadequate oral intake related to early satiety(lymph node swelling) as evidenced by meal completion < 25%. -progressing, being addressed with Boost Breeze and Snacks  GOAL:   Patient will meet greater than or equal to 90% of their needs -progressing  MONITOR:   PO intake, Supplement acceptance, Labs, Weight trends, Skin, I & O's  ASSESSMENT:   62 yo Female with PMH of HTN, endometriosis s/p hysterectomy, tobacco use, who recently admitted with multifocal lymphadenopathy and concerns for lymphoma, she presents with shortness of breath and difficulty sleeping. Symptoms become worse at night. She continues to exhibit lymphadenopathy concerning for lymphoma upon this admission, also has a calcified mass in R lobe of thyroid.  She has now been diagnosed with angioimmunoblastic T-cell lymphoma Stage III-IV. Transferred to [facility] for Port-A-Cath placement and initiation of chemotherapy with CHOP C1 and G-CSF support.  Spoke with patient before she transferred to [facility]. She is feeling ok, ate Pancakes and had some coffee this morning. Documented meal completion is 60% Husband is bringing her snacks from home. Abdomen continues to be distended.  Needs re-estimated with new diagnosis and initiation of chemo.  Weight has hovered right around 140-144 pounds. 5L fluid positive, 220mL UOP so far this shift.  Labs reviewed:  Na 129, BUN 29  Medications reviewed and include:  Decadron 20mg  Insulin NS at 116mL/hr   Diet Order:   Diet Order           Diet regular Room service appropriate? Yes; Fluid consistency: Thin  Diet effective now          EDUCATION NEEDS:   No education needs have been identified at this  time  Skin:  Skin Assessment: (closed incision to R buttocks)  Last BM:  05/05/2018  Height:   Ht Readings from Last 1 Encounters:  04/24/18 5\' 2"  (1.575 m)    Weight:   Wt Readings from Last 1 Encounters:  05/06/18 143 lb 11.8 oz (65.2 kg)    Ideal Body Weight:  50 kg  BMI:  Body mass index is 26.29 kg/m.  Estimated Nutritional Needs:   Kcal:  9323-5573 (30-35 cal/kg)  Protein:  85-98 grams (1.3-1.5g/kg)  Fluid:  >2L    Satira Anis. Kota Ciancio, MS, RD LDN Inpatient Clinical Dietitian Pager [phone]

## 2018-05-06 NOTE — Progress Notes (Signed)
Oncology short note  Patient seen at [facility]. Newly diagnosed Angioimmunoblastic T cell lymphoma Stage III-IV with pleural effusion, anasarca, anemia ?bone marrow involvement. BM Bx done on 6/17 -pending results PLAN -transfer to [facility] oncology floor under hospitalist service -transfuse PRBC to maintain hgb >9 in the setting of upcoming chemotherapy  -fluid status mx per hospitalist -on high dose dexamethasone for the lymphoma currently -rash resolved -ECHO and hepatitis profile noted -would plan to do CHOP C1 in the hospital with G-CSF support. -PET/CT as outpatient. -SW involvement for insurance issues to address followup and care co-ordination needs.  Sullivan Lone MD MS

## 2018-05-06 NOTE — Progress Notes (Addendum)
Report given to [facility] on 6E at [place] transporting pt off unit. Pt with PIV intact to left hand. Pt in stable condition, report given to [facility] EMTs.

## 2018-05-06 NOTE — Progress Notes (Signed)
Inpatient Diabetes Program Recommendations  AACE/ADA: New Consensus Statement on Inpatient Glycemic Control (2019)  Target Ranges:  Prepandial:   less than 140 mg/dL      Peak postprandial:   less than 180 mg/dL (1-2 hours)      Critically ill patients:  140 - 180 mg/dL   Results for CANDI, PROFIT (MRN [id number]) as of 05/06/2018 11:22  Ref. Range 05/05/2018 18:51 05/05/2018 21:16 05/06/2018 07:33  Glucose-Capillary Latest Ref Range: 65 - 99 mg/dL 230 (H) 276 (H) 173 (H)   Review of Glycemic Control  Current orders for Inpatient glycemic control: Novolog 0-9 units TID with meals, Novolog 0-5 units QHS; Decadron 20 mg Q12H  Inpatient Diabetes Program Recommendations: Correction (SSI): If decadron is continued, please consider increasing Novolog correction to Moderate scale (Novolog 0-15 units) TID with meals and continue Novolog 0-5 units QHS.  Insulin - Basal: If glucose is consistently greater than 180 mg/dl with Novolog moderate correction scale and decadron is continued, please consider ordering Lantus 5 units Q24H.   Thanks, Barnie Alderman, RN, MSN, CDE Diabetes Coordinator Inpatient Diabetes Program [phone] (Team Pager from 8am to 5pm)

## 2018-05-06 NOTE — Progress Notes (Addendum)
Pt reported feeling something wet on her back - dressing saturated with clear, serous drainage from biopsy site. Dressing changed; added more dry gauze. Pt SpO2 97% at rest on room air, using O2 prn.  0836 - dressing changed again, ABD pad placed. Continues to be serous drainage - when dressing is changed, drainage can be seen coming out.  1601 - ABD pad saturated with serous drainage. Per MD order, placed gauze and Tegaderm for occlusive dressing.

## 2018-05-06 NOTE — Progress Notes (Addendum)
Dressing to lower back from  Bone marrow site was changed . 4 4x4"s folded were saturated with serosanguinous fluid and continues to drain while the dressing was off. This is the 4 th time the dressing has been changed today I have notified Dr Zigmund Daniel. She recommended that I call Dr Irene Limbo.  I left a detail message in Dr Pollie Meyer VM and I spoke with Cyril Mourning at his office , gave her the same information and she will speak with Dr Irene Limbo as soon as he is available . She  Stated she will make this a priority.   I  Have spoken with Lennette Bihari PA from IR. He will come and assess the bone marrow site.   Present dressing was removed for Lennette Bihari to assess. The dressing was saturated with serous mild pink fluid. The bone marrow site did not leak until PT stood up. An ABD pad was fold in 1/2 and to packages of 4x4 placed over site and secured with 2x2 hypafix tape.

## 2018-05-06 NOTE — Progress Notes (Signed)
Subjective: Ms Rondeau was seen sitting in her bedside chair this morning. She is feeling okay today, but continues to experience shortness of breath. She notes significant drainage from bone marrow biopsy site.  She was seen by oncology this morning who informed her of the plan to transfer her to [place] to begin treatment for her lymphoma. She slept a little better last night. She has no further questions or concerns this morning.  Objective:  Vital signs in last 24 hours: Vitals:   05/05/18 1500 05/05/18 2055 05/05/18 2114 05/06/18 0601  BP: (!) 115/58  (!) 129/49 (!) 125/55  Pulse: (!) 105  (!) 102 93  Resp:   20 20  Temp: (!) 97.5 F (36.4 C)   97.7 F (36.5 C)  TempSrc: Axillary   Axillary  SpO2: 96% 98% 96% 95%  Weight:    143 lb 11.8 oz (65.2 kg)  Height:       Physical Exam  Constitutional: She is oriented to person, place, and time. She appears well-developed. No distress.  Chronically ill appearing  HENT:  Prominent cervical, axillary and peri-auricular lymphadenopathy.   Eyes: EOM are normal. Right eye exhibits no discharge. Left eye exhibits no discharge.  Cardiovascular: Regular rhythm, normal heart sounds and intact distal pulses.  Tachycardia  Pulmonary/Chest: Effort normal. No respiratory distress.  Diminished breath sound bilateral bases R>L  Abdominal: Bowel sounds are normal. She exhibits distension. There is tenderness.  Musculoskeletal: She exhibits edema. She exhibits no deformity.  Compression stockings on Wet dressing of bone marrow biopsy site with drainage noted  Neurological: She is alert and oriented to person, place, and time.  Skin: Skin is warm and dry.  Psychiatric: She has a normal mood and affect.   Assessment/Plan: 62 yo presented for evaluation of DOE found to have diffuse cervical, thoracic, and abdominal lymphadenopathy. Biopsy concerning for a lymphoproliferative process, particularly early involvement by angioimmunoblastic T-cell  lymphoma confirmed on pathology report from [facility] [place].   Diffuse lymphadenopathy concerning for T-cell Lymphoma Splenomegaly Bilateral pleural effusions, small Persistent DOE, continues to saturate well on RA. Final pathology report from the [facility] confirmed initial biopsy result suggesting angioimmunoblastic T-cell lymphoma. Exhibits edema, electrolyte abnormalities and hematologic abnormalities consistent lymphoma (capillary leak, bone marrow infiltration). 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  > Bone marrow biopsy performed this morning > Hepatitis panel negative > CMV, EBV serologies positive > Port-a-cath to be placed at [place] their recommendations - Could consider therapeutic thoracentesis if pleural effusions were to enlarge/declining respiratory status - Transfer to [facility], to initiate chemotherapy and further oncology workup - PO Decadron 20 mg BID (PO Benadryl 50 mg PRN insomnia) - Allopurinol and IVF for prophylaxis of tumor lysis syndrome, Uric Acid 16.7 (05/03/18)  - Albuterol, supplemental O2 and robitussin-dm prn - Continue PT  AKI NAGMA - Resolved Hyponatremia: Patient with hyponatremia which is downtrending. Initially thought to be secondary to hypervolemia however did not improve with lasix. Suspect related to capillary leak; difficult to manage given underlying process.  > Renal function improved Cr 0.72 > Na improving at 129 - Monitor AM BMET - I&O, Daily Weights  Lower Extremity Edema: Stable. Improves some with elevation. Likely 2/2 low albumin and capillary leak. Volume overload treated with lasix with improvement in patient weight, lasix discontinued.  > Echocardiogram showed EF 60-65% and normal diastolic function - Elevate lower extremities - Strict I&O, Daily Weights - Compression stocking or  ACE wrap - AM BMP  Poor Oral intake: Stable. Decreased  oral intake due to discomfort from enlarged lymph nodes. - Boost TID between meals; Nutrition on  Subclinical hypothyroidism Calcified mass in R lobe of thyroid: Incidental CT finding, again noted on admission CXR. Per CT Read: non-emergent follow up ultrasound for further evaluation. TSH elevated to 9.83. Free T4 lower end of normal at 0.99  Thrombocytopenia, Anemia: Likely secondary to bone marrow involvement. Holding Lovenox for now, especially given some hemoptysis this admission; will follow and transfuse if needed.  > Hgb 8.7>>8.4>>7.7 , stable at 7.7 this AM - Oncology recommends transfusion goal of >9 prior to chemotherapy, to be transfused at  to prevent delay of transfer - Daily CBC  FEN: Regular Diet VTE Prophylaxis: TED hose/SCDs Code Status: Full  Dispo: Anticipated transfer to [facility] today.   Neva Seat, MD 05/06/2018, 8:07 AM Pager: [phone]

## 2018-05-07 ENCOUNTER — Other Ambulatory Visit: Payer: Self-pay | Admitting: Hematology

## 2018-05-07 ENCOUNTER — Inpatient Hospital Stay (HOSPITAL_COMMUNITY): Payer: Self-pay

## 2018-05-07 ENCOUNTER — Encounter (HOSPITAL_COMMUNITY): Payer: Self-pay | Admitting: Interventional Radiology

## 2018-05-07 HISTORY — PX: IR IMAGING GUIDED PORT INSERTION: IMG5740

## 2018-05-07 LAB — TYPE AND SCREEN
ABO/RH(D): O POS
Antibody Screen: NEGATIVE

## 2018-05-07 LAB — CBC WITH DIFFERENTIAL/PLATELET
BASOS PCT: 1 %
Basophils Absolute: 0 10*3/uL (ref 0.0–0.1)
EOS ABS: 0 10*3/uL (ref 0.0–0.7)
EOS PCT: 0 %
HCT: 23.2 % — ABNORMAL LOW (ref 36.0–46.0)
Hemoglobin: 7.8 g/dL — ABNORMAL LOW (ref 12.0–15.0)
LYMPHS PCT: 28 %
Lymphs Abs: 1.3 10*3/uL (ref 0.7–4.0)
MCH: 30.6 pg (ref 26.0–34.0)
MCHC: 33.6 g/dL (ref 30.0–36.0)
MCV: 91 fL (ref 78.0–100.0)
MONO ABS: 0.4 10*3/uL (ref 0.1–1.0)
Monocytes Relative: 8 %
NEUTROS PCT: 63 %
Neutro Abs: 2.9 10*3/uL (ref 1.7–7.7)
PLATELETS: 135 10*3/uL — AB (ref 150–400)
RBC: 2.55 MIL/uL — ABNORMAL LOW (ref 3.87–5.11)
RDW: 17.6 % — ABNORMAL HIGH (ref 11.5–15.5)
WBC: 4.6 10*3/uL (ref 4.0–10.5)

## 2018-05-07 LAB — GLUCOSE, CAPILLARY
GLUCOSE-CAPILLARY: 211 mg/dL — AB (ref 65–99)
Glucose-Capillary: 231 mg/dL — ABNORMAL HIGH (ref 65–99)
Glucose-Capillary: 175 mg/dL — ABNORMAL HIGH (ref 65–99)
Glucose-Capillary: 140 mg/dL — ABNORMAL HIGH (ref 65–99)

## 2018-05-07 LAB — COMPREHENSIVE METABOLIC PANEL
ALT: 19 U/L (ref 14–54)
AST: 24 U/L (ref 15–41)
Albumin: 2.8 g/dL — ABNORMAL LOW (ref 3.5–5.0)
Alkaline Phosphatase: 52 U/L (ref 38–126)
Anion gap: 3 — ABNORMAL LOW (ref 5–15)
BILIRUBIN TOTAL: 0.6 mg/dL (ref 0.3–1.2)
BUN: 31 mg/dL — AB (ref 6–20)
CALCIUM: 8.3 mg/dL — AB (ref 8.9–10.3)
CHLORIDE: 105 mmol/L (ref 101–111)
CO2: 26 mmol/L (ref 22–32)
CREATININE: 0.81 mg/dL (ref 0.44–1.00)
GFR calc Af Amer: >60 mL/min (ref >60)
Glucose, Bld: 238 mg/dL — ABNORMAL HIGH (ref 65–99)
Potassium: 5 mmol/L (ref 3.5–5.1)
Sodium: 134 mmol/L — ABNORMAL LOW (ref 135–145)
TOTAL PROTEIN: 8.1 g/dL (ref 6.5–8.1)

## 2018-05-07 LAB — PHOSPHORUS: PHOSPHORUS: 3.6 mg/dL (ref 2.5–4.6)

## 2018-05-07 LAB — MAGNESIUM: MAGNESIUM: 2.3 mg/dL (ref 1.7–2.4)

## 2018-05-07 LAB — URIC ACID: Uric Acid, Serum: 8.8 mg/dL — ABNORMAL HIGH (ref 2.3–6.6)

## 2018-05-07 MED ORDER — FENTANYL CITRATE (PF) 100 MCG/2ML IJ SOLN
INTRAMUSCULAR | Status: AC
  Filled 2018-05-07: qty 4

## 2018-05-07 MED ORDER — FLUMAZENIL 0.5 MG/5ML IV SOLN
INTRAVENOUS | Status: AC
  Filled 2018-05-07: qty 5

## 2018-05-07 MED ORDER — HEPARIN SOD (PORK) LOCK FLUSH 100 UNIT/ML IV SOLN
INTRAVENOUS | Status: AC | PRN
  Administered 2018-05-07: 500 [IU] via INTRAVENOUS

## 2018-05-07 MED ORDER — CEFAZOLIN SODIUM-DEXTROSE 2-4 GM/100ML-% IV SOLN
INTRAVENOUS | Status: AC
  Administered 2018-05-07: 2 g via INTRAVENOUS
  Filled 2018-05-07: qty 100

## 2018-05-07 MED ORDER — LIDOCAINE HCL 1 % IJ SOLN
INTRAMUSCULAR | Status: AC
  Filled 2018-05-07: qty 20

## 2018-05-07 MED ORDER — FENTANYL CITRATE (PF) 100 MCG/2ML IJ SOLN
INTRAMUSCULAR | Status: AC | PRN
  Administered 2018-05-07 (×2): 50 ug via INTRAVENOUS

## 2018-05-07 MED ORDER — PREDNISONE 20 MG PO TABS: 60.0000 mg | ORAL_TABLET | Freq: Every day | ORAL | 6 refills | Status: DC

## 2018-05-07 MED ORDER — PREDNISONE 20 MG PO TABS: 60.0000 mg | ORAL_TABLET | Freq: Every day | ORAL | Status: DC

## 2018-05-07 MED ORDER — CEFAZOLIN SODIUM-DEXTROSE 2-4 GM/100ML-% IV SOLN
2.0000 g | Freq: Once | INTRAVENOUS | Status: AC
  Administered 2018-05-07: 2 g via INTRAVENOUS

## 2018-05-07 MED ORDER — MIDAZOLAM HCL 2 MG/2ML IJ SOLN
INTRAMUSCULAR | Status: AC
  Filled 2018-05-07: qty 4

## 2018-05-07 MED ORDER — NALOXONE HCL 0.4 MG/ML IJ SOLN
INTRAMUSCULAR | Status: AC
  Filled 2018-05-07: qty 1

## 2018-05-07 MED ORDER — HEPARIN SOD (PORK) LOCK FLUSH 100 UNIT/ML IV SOLN
INTRAVENOUS | Status: AC
  Filled 2018-05-07: qty 5

## 2018-05-07 MED ORDER — SODIUM CHLORIDE 0.9% IV SOLUTION
Freq: Once | INTRAVENOUS | Status: AC
  Administered 2018-05-11: 10 mL/h via INTRAVENOUS

## 2018-05-07 MED ORDER — MIDAZOLAM HCL 2 MG/2ML IJ SOLN
INTRAMUSCULAR | Status: AC | PRN
  Administered 2018-05-07: 1 mg via INTRAVENOUS
  Administered 2018-05-07 (×2): 0.5 mg via INTRAVENOUS

## 2018-05-07 MED ORDER — LIDOCAINE HCL (PF) 1 % IJ SOLN
INTRAMUSCULAR | Status: AC | PRN
  Administered 2018-05-07: 10 mL

## 2018-05-07 MED ORDER — HYDROMORPHONE HCL 1 MG/ML IJ SOLN
0.5000 mg | Freq: Four times a day (QID) | INTRAMUSCULAR | Status: DC | PRN
  Administered 2018-05-09 (×2): 0.5 mg via INTRAVENOUS
  Filled 2018-05-07 (×2): qty 1

## 2018-05-07 NOTE — Progress Notes (Signed)
MEDICATION-RELATED CONSULT NOTE   IR Procedure Consult - Anticoagulant/Antiplatelet PTA/Inpatient Med List Review by Pharmacist    Procedure: Placement of a right IJ approach single lumen PowerPort    Completed: 05/07/18 at ~5PM  Post-Procedural bleeding risk per IR MD assessment:  standard  Antithrombotic medications on inpatient or PTA profile prior to procedure:   none    Plan:     - pt has TED hose for VTE prophylaxis - pharmacy will sign off  Dia Sitter, PharmD, BCPS 05/07/2018 5:15 PM

## 2018-05-07 NOTE — Discharge Instructions (Signed)
Moderate Conscious Sedation, Adult, Care After °These instructions provide you with information about caring for yourself after your procedure. Your health care provider may also give you more specific instructions. Your treatment has been planned according to current medical practices, but problems sometimes occur. Call your health care provider if you have any problems or questions after your procedure. °What can I expect after the procedure? °After your procedure, it is common: °· To feel sleepy for several hours. °· To feel clumsy and have poor balance for several hours. °· To have poor judgment for several hours. °· To vomit if you eat too soon. ° °Follow these instructions at home: °For at least 24 hours after the procedure: ° °· Do not: °? Participate in activities where you could fall or become injured. °? Drive. °? Use heavy machinery. °? Drink alcohol. °? Take sleeping pills or medicines that cause drowsiness. °? Make important decisions or sign legal documents. °? Take care of children on your own. °· Rest. °Eating and drinking °· Follow the diet recommended by your health care provider. °· If you vomit: °? Drink water, juice, or soup when you can drink without vomiting. °? Make sure you have little or no nausea before eating solid foods. °General instructions °· Have a responsible adult stay with you until you are awake and alert. °· Take over-the-counter and prescription medicines only as told by your health care provider. °· If you smoke, do not smoke without supervision. °· Keep all follow-up visits as told by your health care provider. This is important. °Contact a health care provider if: °· You keep feeling nauseous or you keep vomiting. °· You feel light-headed. °· You develop a rash. °· You have a fever. °Get help right away if: °· You have trouble breathing. °This information is not intended to replace advice given to you by your health care provider. Make sure you discuss any questions you have  with your health care provider. °Document Released: 08/26/2013 Document Revised: 04/09/2016 Document Reviewed: 02/25/2016 °[employer] Interactive Patient Education © 2018 [employer] °Implanted Port Home Guide °An implanted port is a type of central line that is placed under the skin. Central lines are used to provide IV access when treatment or nutrition needs to be given through a person’s veins. Implanted ports are used for long-term IV access. An implanted port may be placed because: °· You need IV medicine that would be irritating to the small veins in your hands or arms. °· You need long-term IV medicines, such as antibiotics. °· You need IV nutrition for a long period. °· You need frequent blood draws for lab tests. °· You need dialysis. ° °Implanted ports are usually placed in the chest area, but they can also be placed in the upper arm, the abdomen, or the leg. An implanted port has two main parts: °· Reservoir. The reservoir is round and will appear as a small, raised area under your skin. The reservoir is the part where a needle is inserted to give medicines or draw blood. °· Catheter. The catheter is a thin, flexible tube that extends from the reservoir. The catheter is placed into a large vein. Medicine that is inserted into the reservoir goes into the catheter and then into the vein. ° °How will I care for my incision site? °Do not get the incision site wet. Bathe or shower as directed by your health care provider. °How is my port accessed? °Special steps must be taken to access the port: °·   Before the port is accessed, a numbing cream can be placed on the skin. This helps numb the skin over the port site. °· Your health care provider uses a sterile technique to access the port. °? Your health care provider must put on a mask and sterile gloves. °? The skin over your port is cleaned carefully with an antiseptic and allowed to dry. °? The port is gently pinched between sterile gloves, and a needle is  inserted into the port. °· Only "non-coring" port needles should be used to access the port. Once the port is accessed, a blood return should be checked. This helps ensure that the port is in the vein and is not clogged. °· If your port needs to remain accessed for a constant infusion, a clear (transparent) bandage will be placed over the needle site. The bandage and needle will need to be changed every week, or as directed by your health care provider. °· Keep the bandage covering the needle clean and dry. Do not get it wet. Follow your health care provider’s instructions on how to take a shower or bath while the port is accessed. °· If your port does not need to stay accessed, no bandage is needed over the port. ° °What is flushing? °Flushing helps keep the port from getting clogged. Follow your health care provider’s instructions on how and when to flush the port. Ports are usually flushed with saline solution or a medicine called heparin. The need for flushing will depend on how the port is used. °· If the port is used for intermittent medicines or blood draws, the port will need to be flushed: °? After medicines have been given. °? After blood has been drawn. °? As part of routine maintenance. °· If a constant infusion is running, the port may not need to be flushed. ° °How long will my port stay implanted? °The port can stay in for as long as your health care provider thinks it is needed. When it is time for the port to come out, surgery will be done to remove it. The procedure is similar to the one performed when the port was put in. °When should I seek immediate medical care? °When you have an implanted port, you should seek immediate medical care if: °· You notice a bad smell coming from the incision site. °· You have swelling, redness, or drainage at the incision site. °· You have more swelling or pain at the port site or the surrounding area. °· You have a fever that is not controlled with  medicine. ° °This information is not intended to replace advice given to you by your health care provider. Make sure you discuss any questions you have with your health care provider. °Document Released: 11/05/2005 Document Revised: 04/12/2016 Document Reviewed: 07/13/2013 °[employer] Interactive Patient Education © 2017 [employer] ° °

## 2018-05-07 NOTE — Progress Notes (Signed)
HEMATOLOGY/ONCOLOGY INPATIENT PROGRESS NOTE  Date of Service: 05/08/2018  Inpatient Attending: .Georgette Shell, MD  SUBJECTIVE:   [name] reports that she is doing well overall. The pt notes that she lives with her husband and will have help at home whenever she leaves the hospital.  She notes that she is feeling weak, tired and still feels swollen.  She is keen to get started with her chemotherapy - [facility] today. Discussed specifics of chemotherapy and had informed consent signed.  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 Shall be getting 1 units pre -chemotherapy and the 2nd unit after chemotherapy.  Discussed with pharmacy about adding Rasburicase 6m x 1 for TLS prophylaxis for elevated uric acid and high risk for TLS situation. Had uneventful port-a-cath placement tomorrow.  The pt reports that she is not eating better but understands that this is an important element to be optimized.   Lab results today (05/08/18) of CBC w/diff is as follows: all values are WNL except for RBC at 2.32, HGB at 7.2, HCT at 21.7, RDW at 18.8, PLT at 124k Uric acid 05/08/18 is elevated at 8.2   On review of systems, pt reports abdominal tightness, abdominal discomfort, moving her bowels well, weakness, fatigue, leg swelling and denies abdominal pain, and any other symptoms.    OBJECTIVE:  NAD  PHYSICAL EXAMINATION: . Vitals:   05/08/18 0551 05/08/18 1320 05/08/18 1402 05/08/18 1450  BP: (!) 118/47 (!) 113/47 (!) 114/43 (!) 115/44  Pulse: 100 (!) 102 (!) 104 (!) 102  Resp: 17 (!) 22 (!) 22 18  Temp: 97.8 F (36.6 C) 98.1 F (36.7 C) 98.4 F (36.9 C) 97.8 F (36.6 C)  TempSrc: Oral Axillary Axillary Oral  SpO2: 100% 96% 100% 100%  Weight:      Height:       Filed Weights   05/04/18 0203 05/05/18 0329 05/06/18 0601  Weight: 142 lb 14.4 oz (64.8 kg) 153 lb 7 oz (69.6 kg) 143 lb 11.8 oz  (65.2 kg)   .Body mass index is 26.29 kg/m.  GENERAL:alert, in no acute distress and comfortable SKIN: skin color, texture, turgor are normal, no rashes or significant lesions EYES: normal, conjunctiva are pink and non-injected, sclera clear OROPHARYNX:no exudate, no erythema and lips, buccal mucosa, and tongue normal  NECK: supple, no JVD, thyroid normal size, non-tender, without nodularity LYMPH:  no palpable lymphadenopathy in the cervical, axillary or inguinal LUNGS: clear to auscultation with normal respiratory effort HEART: regular rate & rhythm,  no murmurs and no lower extremity edema ABDOMEN: abdomen soft, non-tender, normoactive bowel sounds  Musculoskeletal: no cyanosis of digits and no clubbing  PSYCH: alert & oriented x 3 with fluent speech NEURO: no focal motor/sensory deficits  MEDICAL HISTORY:  Past Medical History:  Diagnosis Date  . Bilateral pleural effusion 04/17/2018  . Endometriosis   . Essential hypertension, benign   . Family history of adverse reaction to anesthesia    "sister stops breathing I think" (04/18/2018)  . Heart murmur   . Mixed hyperlipidemia   . Pneumonia 04/17/2018    SURGICAL HISTORY: Past Surgical History:  Procedure Laterality Date  . ABDOMINAL HYSTERECTOMY  1970s/1980s   for endometriosis  . Arthroscopic right shoulder surgery    . AXILLARY LYMPH NODE BIOPSY Left 04/21/2018   Procedure: LEFT AXILLARY LYMPH NODE BIOPSY;  Surgeon: KKieth Brightly LArta Bruce MD;  Location: [facility]  Service:  General;  Laterality: Left;  . CARPAL TUNNEL RELEASE Right   . GANGLION CYST EXCISION Right   . IR IMAGING GUIDED PORT INSERTION  05/07/2018  . MULTIPLE TOOTH EXTRACTIONS    . OOPHORECTOMY  1997  . PARTIAL HYSTERECTOMY  1984  . Right hand surgery    . SHOULDER OPEN ROTATOR CUFF REPAIR Right   . VESICOVAGINAL FISTULA CLOSURE W/ TAH      SOCIAL HISTORY: Social History   Socioeconomic History  . Marital status: Married    Spouse name: Not on file    . Number of children: Not on file  . Years of education: Not on file  . Highest education level: Not on file  Occupational History  . Occupation: Social research officer, government    Comment: works at [employer]  . Financial resource strain: Not on file  . Food insecurity:    Worry: Not on file    Inability: Not on file  . Transportation needs:    Medical: Not on file    Non-medical: Not on file  Tobacco Use  . Smoking status: Current Every Day Smoker    Packs/day: 1.50    Years: 46.00    Pack years: 69.00    Types: Cigarettes  . Smokeless tobacco: Never Used  Substance and Sexual Activity  . Alcohol use: Not Currently    Comment: 04/18/2018 "quit years ago"  . Drug use: Not Currently    Comment: "back in my 40s"  . Sexual activity: Not Currently  Lifestyle  . Physical activity:    Days per week: Not on file    Minutes per session: Not on file  . Stress: Not on file  Relationships  . Social connections:    Talks on phone: Not on file    Gets together: Not on file    Attends religious service: Not on file    Active member of club or organization: Not on file    Attends meetings of clubs or organizations: Not on file    Relationship status: Not on file  . Intimate partner violence:    Fear of current or ex partner: Not on file    Emotionally abused: Not on file    Physically abused: Not on file    Forced sexual activity: Not on file  Other Topics Concern  . Not on file  Social History Narrative   ** Merged History Encounter **       Patient has one son whom is healthy.   Has a live in boyfriend.    FAMILY HISTORY: Family History  Problem Relation Age of Onset  . Diabetes Mother        age 4  . Heart attack Father        died age 71's  . Hypertension Father   . Cancer Sister        breast cancer diagonsed age 20 years  . Diabetes Unknown   . Heart disease Unknown        Female <55  . Arthritis Unknown   . Asthma Unknown   . Hyperlipidemia Other         several siblings  . Thyroid disease Sister        one sister with thyroid disease    ALLERGIES:  has No Known Allergies.  MEDICATIONS:  Scheduled Meds: . sodium chloride   Intravenous Once  . sodium chloride   Intravenous Once  . allopurinol  300 mg Oral Daily  . feeding  supplement  1 Container Oral TID BM  . insulin aspart  0-5 Units Subcutaneous QHS  . insulin aspart  0-9 Units Subcutaneous TID WC  . ipratropium-albuterol  3 mL Nebulization Q6H  . ramelteon  8 mg Oral QHS  . sodium chloride flush  3 mL Intravenous Q12H   Continuous Infusions: . sodium chloride 125 mL/hr at 05/06/18 1351   PRN Meds:.acetaminophen **OR** acetaminophen, albuterol, diclofenac sodium, diphenhydrAMINE, guaiFENesin-dextromethorphan, HYDROmorphone (DILAUDID) injection, ondansetron **OR** ondansetron (ZOFRAN) IV, polyethylene glycol  REVIEW OF SYSTEMS:    10 Point review of Systems was done is negative except as noted above.   LABORATORY DATA:  I have reviewed the data as listed  . CBC Latest Ref Rng & Units 05/08/2018 05/07/2018 05/06/2018  WBC 4.0 - 10.5 K/uL 4.5 4.6 5.7  Hemoglobin 12.0 - 15.0 g/dL 7.2(L) 7.8(L) 7.7(L)  Hematocrit 36.0 - 46.0 % 21.7(L) 23.2(L) 22.7(L)  Platelets 150 - 400 K/uL 124(L) 135(L) 118(L)    . CMP Latest Ref Rng & Units 05/08/2018 05/07/2018 05/06/2018  Glucose 65 - 99 mg/dL 133(H) 238(H) 189(H)  BUN 6 - 20 mg/dL 26(H) 31(H) 29(H)  Creatinine 0.44 - 1.00 mg/dL 0.66 0.81 0.72  Sodium 135 - 145 mmol/L 136 134(L) 129(L)  Potassium 3.5 - 5.1 mmol/L 4.3 5.0 4.7  Chloride 101 - 111 mmol/L 106 105 100(L)  CO2 22 - 32 mmol/L _0 Calcium 8.9 - 10.3 mg/dL 8.2(L) 8.3(L) 8.4(L)  Total Protein 6.5 - 8.1 g/dL - 8.1 -  Total Bilirubin 0.3 - 1.2 mg/dL - 0.6 -  Alkaline Phos 38 - 126 U/L - 52 -  AST 15 - 41 U/L - 24 -  ALT 14 - 54 U/L - 19 -   . Lab Results  Component Value Date   LDH 322 (H) 05/03/2018   Component     Latest Ref Rng & Units 05/03/2018  05/07/2018 05/08/2018  IgG (Immunoglobin G), Serum     700 - 1,600 mg/dL  3,696 (H)   IgA     87 - 352 mg/dL  292   IgM (Immunoglobulin M), Srm     26 - 217 mg/dL  492 (H)   Total Protein ELP     6.0 - 8.5 g/dL  8.0   Albumin SerPl Elph-Mcnc     2.9 - 4.4 g/dL  2.9   Alpha 1     0.0 - 0.4 g/dL  0.1   Alpha2 Glob SerPl Elph-Mcnc     0.4 - 1.0 g/dL  0.6   B-Globulin SerPl Elph-Mcnc     0.7 - 1.3 g/dL  0.9   Gamma Glob SerPl Elph-Mcnc     0.4 - 1.8 g/dL  3.5 (H)   M Protein SerPl Elph-Mcnc     Not Observed g/dL  Not Observed   Globulin, Total     2.2 - 3.9 g/dL  5.1 (H)   Albumin/Glob SerPl     0.7 - 1.7  0.6 (L)   IFE 1       Comment   Please Note (HCV):       Comment   Hepatitis B Surface Ag     Negative Negative    HCV Ab     0.0 - 0.9 s/co ratio <0.1    Hep A Ab, IgM     Negative Negative    Hep B Core Ab, IgM     Negative Negative    CMV IgM     0.0 - 29.9  AU/mL 104.0 (H)    CMV Ab - IgG     0.00 - 0.59 U/mL 2.60 (H)    Uric Acid, Serum     2.3 - 6.6 mg/dL 16.7 (H) 8.8 (H) 8.2 (H)     RADIOGRAPHIC STUDIES: I have personally reviewed the radiological images as listed and agreed with the findings in the report. Dg Chest 2 View  Result Date: 04/23/2018 CLINICAL DATA:  Recent thoracentesis, worsening shortness of breath would like swelling. Productive cough. History of pleural effusion, pneumonia, left axillary node biopsy April 21, 2018. EXAM: CHEST - 2 VIEW COMPARISON:  Chest radiograph April 19, 2018 and CT chest Apr 18, 2018 FINDINGS: Increasing small bilateral pleural effusions. Diffuse interstitial prominence. Increased lung volumes and flattened hemidiaphragms. Worsening patchy right middle lobe airspace opacity. Left lung base atelectasis. Cardiac silhouette mildly enlarged. Calcified aortic arch. No pneumothorax. Calcified right thyroid nodule. Soft tissue planes and included osseous structures are not suspicious. IMPRESSION: Increasing small bilateral pleural  effusions. Worsening right middle lobe airspace opacity with left lung base atelectasis. Mild cardiomegaly.  COPD. Aortic Atherosclerosis (ICD10-I70.0). Electronically Signed   By: Elon Alas M.D.   On: 04/23/2018 21:15   Dg Chest 2 View  Result Date: 04/19/2018 CLINICAL DATA:  Productive cough and shortness of breath for 1 week. EXAM: CHEST - 2 VIEW COMPARISON:  PA and lateral chest 04/17/2018. CT chest, abdomen and pelvis 04/18/2018. FINDINGS: Small bilateral pleural effusions are seen, greater on the right. Basilar atelectasis is noted. No consolidative process or pneumothorax. Heart size is normal. Aortic atherosclerosis is noted. Calcified lesion in the right lobe of the thyroid is noted. No acute bony abnormality. IMPRESSION: Small bilateral pleural effusions and mild basilar atelectasis. Atherosclerosis. Electronically Signed   By: Inge Rise M.D.   On: 04/19/2018 13:55   Dg Chest 2 View  Result Date: 04/17/2018 CLINICAL DATA:  Abdominal pain.  Shortness of breath. EXAM: CHEST - 2 VIEW COMPARISON:  None. FINDINGS: The heart size is normal. The hila and mediastinum are unremarkable. A 2.2 cm eggshell calcification is seen in the right thoracic inlet. No pneumothorax. Mild interstitial prominence bilaterally. Small bilateral pleural effusions. No nodules or masses. No other acute abnormalities. IMPRESSION: 1. Mild interstitial opacities in the lungs are nonspecific. Edema could have this appearance but the lack of cardiomegaly would be unusual. Atypical infection is possible. 2. Small bilateral effusions. 3. The eggshell calcification in the right thoracic inlet is nonspecific. This could be a vascular structure or a calcified nodule in the thyroid. Electronically Signed   By: Dorise Bullion III M.D   On: 04/17/2018 23:32   Ct Angio Chest Pe W/cm &/or Wo Cm  Addendum Date: 04/18/2018   ADDENDUM REPORT: 04/18/2018 08:59 ADDENDUM: Add to IMPRESSION: There is wall thickening in the urinary  bladder with mild soft tissue stranding adjacent to the bladder. Suspect a degree of cystitis. Electronically Signed   By: Lowella Grip III M.D.   On: 04/18/2018 08:59   Result Date: 04/18/2018 CLINICAL DATA:  Shortness of breath.  Abdominal pain EXAM: CT ANGIOGRAPHY CHEST CT ABDOMEN AND PELVIS WITH CONTRAST TECHNIQUE: Multidetector CT imaging of the chest was performed using the standard protocol during bolus administration of intravenous contrast. Multiplanar CT image reconstructions and MIPs were obtained to evaluate the vascular anatomy. Multidetector CT imaging of the abdomen and pelvis was performed using the standard protocol during bolus administration of intravenous contrast. CONTRAST:  151m ISOVUE-370 IOPAMIDOL (ISOVUE-370) INJECTION 76% COMPARISON:  Chest radiograph Apr 17, 2018 FINDINGS: CTA CHEST FINDINGS Cardiovascular: There is no demonstrable pulmonary embolus. There is no thoracic aortic aneurysm or dissection. There are foci of calcification in the proximal visualized great vessels. There is atherosclerotic calcification in the thoracic aorta. There are scattered foci of coronary artery calcification. There is a small pericardial effusion. Mediastinum/Nodes: There is a partially calcified mass arising from the right lobe of the thyroid measuring 2.7 x 1.9 cm. Thyroid otherwise appears unremarkable. There is extensive adenopathy throughout the chest. There are multiple supraclavicular lymph nodes, largest measuring 1.0 x 0.9 cm. There are multiple axillary lymph nodes. The largest axillary lymph node on the left measures 2.5 x 2.1 cm cm. The largest axillary lymph node on the right measures 2.3 x 2.0 cm. There is adenopathy throughout the mediastinum. There are multiple aortopulmonary window region lymph nodes, largest measuring 1.4 x 1.4 cm. There is extensive adenopathy in the mediastinum surrounding the trachea and carina. There is a right pretracheal lymph node measuring 1.9 x 1.8 cm.  There is a lymph node to the left of the origin of the carina measuring 1.6 x 1.5 cm. There is extensive adenopathy in the right hilar region. The largest individual lymph node in the right hilum measures 2.2 x 1.4 cm. There are multiple left hilar lymph nodes. The largest left hilar lymph node measures 1.8 x 1.3 cm. There is extensive subcarinal adenopathy. Largest subcarinal lymph node measures 2.8 x 2.4 cm. There are retrocrural lymph nodes bilaterally, largest on the left measuring 1.3 x 1.0 cm. There are several small lymph nodes at the level of the right pericardiophrenic angle. No esophageal lesions are evident. Lungs/Pleura: There are free-flowing pleural effusions bilaterally, larger on the right than on the left. There is consolidation in both lung bases, likely primarily due to compressive atelectasis. There is atelectatic change with volume loss in the right middle lobe medially with focal consolidation adjacent to the right heart border in the medial segment right middle lobe. No pulmonary nodular type lesion is evident on this study. Musculoskeletal: There are no blastic or lytic bone lesions. No chest wall lesions are evident. Several benign-appearing calcifications are noted in the right breast. Review of the MIP images confirms the above findings. CT ABDOMEN and PELVIS FINDINGS Hepatobiliary: No focal liver lesions are appreciable. There is no appreciable gallbladder wall thickening. No biliary duct dilatation. Pancreas: No pancreatic mass or inflammatory focus. Extensive peripancreatic adenopathy is noted. Spleen: Spleen measures 15.0 x 12.4 x 6.8 cm with a measured splenic volume of 632 cubic cm. No focal splenic lesions are evident. Adrenals/Urinary Tract: Adrenals bilaterally appear unremarkable. Kidneys bilaterally show no evident mass or hydronephrosis. No renal or ureteral calculus. No hydronephrosis. Urinary bladder is midline with thickening of the urinary bladder wall. There is also mild  soft tissue stranding surrounding the urinary bladder. Stomach/Bowel: There is no appreciable bowel wall or mesenteric thickening. There is no evident bowel obstruction. No free air or portal venous air. Vascular/Lymphatic: There is atherosclerotic calcification throughout the aorta and iliac arteries. There is moderate narrowing of the aorta with what appears to be hemodynamically significant obstruction in both common femoral arteries. No aneurysm. Major mesenteric arterial vessels appear patent. There is extensive adenopathy throughout the abdomen. There are multiple enlarged lymph nodes in the peripancreatic region, largest measuring 1.7 x 1.5 cm. Scattered prominent mesenteric lymph nodes are noted throughout the abdomen. There is extensive retroperitoneal adenopathy. Largest retroperitoneal lymph node measures 2.5 x 1.5 cm. There is adenopathy to the  right of the celiac axis measuring 2.3 x 1.5 cm. Adenopathy is seen in both external iliac node chains. The largest lymph node in this area on the right measures 2.6 x 1.7 cm. The largest lymph node in this region on the left measures 2.0 by 1.4 cm. There is also extensive adenopathy more posteriorly in the pelvis at the level of each acetabulum. The largest of these lymph nodes on the right measures 3.1 x 1.7 cm. Largest of these lymph nodes on the left measures 3.2 x 1.6 cm. There is adenopathy in both inguinal regions. Largest of these lymph nodes in the inguinal regions on the right measures 1.4 x 1.3 cm. Largest lymph node in the left inguinal region measures 1.8 x 1.6 cm. Reproductive: Uterus is apparently absent. No pelvic mass apart from adenopathy seen. Other: Appendix region appears normal. No abscess is evident in the abdomen or pelvis. There is mild ascites. Musculoskeletal: There are no appreciable blastic or lytic bone lesions. No intramuscular or abdominal wall lesions are evident. Review of the MIP images confirms the above findings. IMPRESSION: CT  angiogram chest: 1. No demonstrable pulmonary embolus. No thoracic aortic aneurysm or dissection. Multiple foci of aortic atherosclerosis noted as well as foci of calcification in great vessels and coronary arteries. 2. Extensive adenopathy at multiple sites as summarized above. This extensive adenopathy raises concern for potential lymphoma. Small cell lung carcinoma could present in this manner as well. 3. Moderate pleural effusions bilaterally, larger on the right than on the left, with compressive atelectasis in the lung bases. There is focal consolidation felt to represent pneumonia adjacent to the right heart border in the medial segment right middle lobe. 4. Partially calcified dominant mass right lobe of thyroid. This mass may warrant nonemergent thyroid ultrasound to further evaluate. CT abdomen and pelvis: 1. Widespread abdominal and pelvic adenopathy. Splenomegaly. This combination of findings raises concern for lymphoma as most likely etiology. 2.  Mild ascites. 3. No abscess in the abdomen or pelvis. No periappendiceal region inflammation. 4. Extent extensive aortoiliac atherosclerosis with what appears to be hemodynamically significant obstruction in the common iliac arteries bilaterally. 5.  Uterus apparently absent. Aortic Atherosclerosis (ICD10-I70.0). Electronically Signed: By: Lowella Grip III M.D. On: 04/18/2018 08:41   Ct Abdomen Pelvis W Contrast  Addendum Date: 04/18/2018   ADDENDUM REPORT: 04/18/2018 08:59 ADDENDUM: Add to IMPRESSION: There is wall thickening in the urinary bladder with mild soft tissue stranding adjacent to the bladder. Suspect a degree of cystitis. Electronically Signed   By: Lowella Grip III M.D.   On: 04/18/2018 08:59   Result Date: 04/18/2018 CLINICAL DATA:  Shortness of breath.  Abdominal pain EXAM: CT ANGIOGRAPHY CHEST CT ABDOMEN AND PELVIS WITH CONTRAST TECHNIQUE: Multidetector CT imaging of the chest was performed using the standard protocol during  bolus administration of intravenous contrast. Multiplanar CT image reconstructions and MIPs were obtained to evaluate the vascular anatomy. Multidetector CT imaging of the abdomen and pelvis was performed using the standard protocol during bolus administration of intravenous contrast. CONTRAST:  128m ISOVUE-370 IOPAMIDOL (ISOVUE-370) INJECTION 76% COMPARISON:  Chest radiograph Apr 17, 2018 FINDINGS: CTA CHEST FINDINGS Cardiovascular: There is no demonstrable pulmonary embolus. There is no thoracic aortic aneurysm or dissection. There are foci of calcification in the proximal visualized great vessels. There is atherosclerotic calcification in the thoracic aorta. There are scattered foci of coronary artery calcification. There is a small pericardial effusion. Mediastinum/Nodes: There is a partially calcified mass arising from the right lobe of  the thyroid measuring 2.7 x 1.9 cm. Thyroid otherwise appears unremarkable. There is extensive adenopathy throughout the chest. There are multiple supraclavicular lymph nodes, largest measuring 1.0 x 0.9 cm. There are multiple axillary lymph nodes. The largest axillary lymph node on the left measures 2.5 x 2.1 cm cm. The largest axillary lymph node on the right measures 2.3 x 2.0 cm. There is adenopathy throughout the mediastinum. There are multiple aortopulmonary window region lymph nodes, largest measuring 1.4 x 1.4 cm. There is extensive adenopathy in the mediastinum surrounding the trachea and carina. There is a right pretracheal lymph node measuring 1.9 x 1.8 cm. There is a lymph node to the left of the origin of the carina measuring 1.6 x 1.5 cm. There is extensive adenopathy in the right hilar region. The largest individual lymph node in the right hilum measures 2.2 x 1.4 cm. There are multiple left hilar lymph nodes. The largest left hilar lymph node measures 1.8 x 1.3 cm. There is extensive subcarinal adenopathy. Largest subcarinal lymph node measures 2.8 x 2.4 cm.  There are retrocrural lymph nodes bilaterally, largest on the left measuring 1.3 x 1.0 cm. There are several small lymph nodes at the level of the right pericardiophrenic angle. No esophageal lesions are evident. Lungs/Pleura: There are free-flowing pleural effusions bilaterally, larger on the right than on the left. There is consolidation in both lung bases, likely primarily due to compressive atelectasis. There is atelectatic change with volume loss in the right middle lobe medially with focal consolidation adjacent to the right heart border in the medial segment right middle lobe. No pulmonary nodular type lesion is evident on this study. Musculoskeletal: There are no blastic or lytic bone lesions. No chest wall lesions are evident. Several benign-appearing calcifications are noted in the right breast. Review of the MIP images confirms the above findings. CT ABDOMEN and PELVIS FINDINGS Hepatobiliary: No focal liver lesions are appreciable. There is no appreciable gallbladder wall thickening. No biliary duct dilatation. Pancreas: No pancreatic mass or inflammatory focus. Extensive peripancreatic adenopathy is noted. Spleen: Spleen measures 15.0 x 12.4 x 6.8 cm with a measured splenic volume of 632 cubic cm. No focal splenic lesions are evident. Adrenals/Urinary Tract: Adrenals bilaterally appear unremarkable. Kidneys bilaterally show no evident mass or hydronephrosis. No renal or ureteral calculus. No hydronephrosis. Urinary bladder is midline with thickening of the urinary bladder wall. There is also mild soft tissue stranding surrounding the urinary bladder. Stomach/Bowel: There is no appreciable bowel wall or mesenteric thickening. There is no evident bowel obstruction. No free air or portal venous air. Vascular/Lymphatic: There is atherosclerotic calcification throughout the aorta and iliac arteries. There is moderate narrowing of the aorta with what appears to be hemodynamically significant obstruction in  both common femoral arteries. No aneurysm. Major mesenteric arterial vessels appear patent. There is extensive adenopathy throughout the abdomen. There are multiple enlarged lymph nodes in the peripancreatic region, largest measuring 1.7 x 1.5 cm. Scattered prominent mesenteric lymph nodes are noted throughout the abdomen. There is extensive retroperitoneal adenopathy. Largest retroperitoneal lymph node measures 2.5 x 1.5 cm. There is adenopathy to the right of the celiac axis measuring 2.3 x 1.5 cm. Adenopathy is seen in both external iliac node chains. The largest lymph node in this area on the right measures 2.6 x 1.7 cm. The largest lymph node in this region on the left measures 2.0 by 1.4 cm. There is also extensive adenopathy more posteriorly in the pelvis at the level of each acetabulum. The largest  of these lymph nodes on the right measures 3.1 x 1.7 cm. Largest of these lymph nodes on the left measures 3.2 x 1.6 cm. There is adenopathy in both inguinal regions. Largest of these lymph nodes in the inguinal regions on the right measures 1.4 x 1.3 cm. Largest lymph node in the left inguinal region measures 1.8 x 1.6 cm. Reproductive: Uterus is apparently absent. No pelvic mass apart from adenopathy seen. Other: Appendix region appears normal. No abscess is evident in the abdomen or pelvis. There is mild ascites. Musculoskeletal: There are no appreciable blastic or lytic bone lesions. No intramuscular or abdominal wall lesions are evident. Review of the MIP images confirms the above findings. IMPRESSION: CT angiogram chest: 1. No demonstrable pulmonary embolus. No thoracic aortic aneurysm or dissection. Multiple foci of aortic atherosclerosis noted as well as foci of calcification in great vessels and coronary arteries. 2. Extensive adenopathy at multiple sites as summarized above. This extensive adenopathy raises concern for potential lymphoma. Small cell lung carcinoma could present in this manner as well. 3.  Moderate pleural effusions bilaterally, larger on the right than on the left, with compressive atelectasis in the lung bases. There is focal consolidation felt to represent pneumonia adjacent to the right heart border in the medial segment right middle lobe. 4. Partially calcified dominant mass right lobe of thyroid. This mass may warrant nonemergent thyroid ultrasound to further evaluate. CT abdomen and pelvis: 1. Widespread abdominal and pelvic adenopathy. Splenomegaly. This combination of findings raises concern for lymphoma as most likely etiology. 2.  Mild ascites. 3. No abscess in the abdomen or pelvis. No periappendiceal region inflammation. 4. Extent extensive aortoiliac atherosclerosis with what appears to be hemodynamically significant obstruction in the common iliac arteries bilaterally. 5.  Uterus apparently absent. Aortic Atherosclerosis (ICD10-I70.0). Electronically Signed: By: Lowella Grip III M.D. On: 04/18/2018 08:41   Ct Bone Marrow Biopsy & Aspiration  Result Date: 05/05/2018 INDICATION: Lymphoma EXAM: CT BONE MARROW BIOPSY AND ASPIRATION MEDICATIONS: None. ANESTHESIA/SEDATION: Fentanyl 75 mcg IV; Versed 2 mg IV Moderate Sedation Time:  32 minutes The patient was continuously monitored during the procedure by the interventional radiology nurse under my direct supervision. FLUOROSCOPY TIME:  Fluoroscopy Time:  minutes  seconds ( mGy). COMPLICATIONS: None immediate. PROCEDURE: Informed written consent was obtained from the patient after a thorough discussion of the procedural risks, benefits and alternatives. All questions were addressed. Maximal Sterile Barrier Technique was utilized including caps, mask, sterile gowns, sterile gloves, sterile drape, hand hygiene and skin antiseptic. A timeout was performed prior to the initiation of the procedure. Under CT guidance, a(n) 11 gauge guide needle was advanced into the right iliac bone. Aspirates and a core were obtained. Post biopsy images  demonstrate . Patient tolerated the procedure well without complication. Vital sign monitoring by nursing staff during the procedure will continue as patient is in the special procedures unit for post procedure observation. FINDINGS: The images document guide needle placement within the right iliac bone. Post biopsy images demonstrate no hemorrhage. IMPRESSION: Successful CT-guided right iliac bone marrow aspirate and core. Electronically Signed   By: Marybelle Killings M.D.   On: 05/05/2018 13:33   Ir Imaging Guided Port Insertion  Result Date: 05/07/2018 INDICATION: 62 year old female with a history of lymphoma. She has been referred for port catheter. EXAM: IMPLANTED PORT A CATH PLACEMENT WITH ULTRASOUND AND FLUOROSCOPIC GUIDANCE MEDICATIONS: 2 g Ancef; The antibiotic was administered within an appropriate time interval prior to skin puncture. ANESTHESIA/SEDATION: Moderate (conscious) sedation was  employed during this procedure. A total of Versed mg and Fentanyl 100 mcg was administered intravenously. Moderate Sedation Time: 20 minutes. The patient's level of consciousness and vital signs were monitored continuously by radiology nursing throughout the procedure under my direct supervision. FLUOROSCOPY TIME:  0 minutes, 6 seconds (1.5 mGy) COMPLICATIONS: None PROCEDURE: The procedure, risks, benefits, and alternatives were explained to the patient. Questions regarding the procedure were encouraged and answered. The patient understands and consents to the procedure. Ultrasound survey was performed with images stored and sent to PACs. The right neck and chest was prepped with chlorhexidine, and draped in the usual sterile fashion using maximum barrier technique (cap and mask, sterile gown, sterile gloves, large sterile sheet, hand hygiene and cutaneous antiseptic). Antibiotic prophylaxis was provided with 2.0g Ancef administered IV one hour prior to skin incision. Local anesthesia was attained by infiltration with 1%  lidocaine without epinephrine. Ultrasound demonstrated patency of the right internal jugular vein, and this was documented with an image. Under real-time ultrasound guidance, this vein was accessed with a 21 gauge micropuncture needle and image documentation was performed. A small dermatotomy was made at the access site with an 11 scalpel. A 0.018" wire was advanced into the SVC and used to estimate the length of the internal catheter. The access needle exchanged for a 18F micropuncture vascular sheath. The 0.018" wire was then removed and a 0.035" wire advanced into the IVC. An appropriate location for the subcutaneous reservoir was selected below the clavicle and an incision was made through the skin and underlying soft tissues. The subcutaneous tissues were then dissected using a combination of blunt and sharp surgical technique and a pocket was formed. A single lumen power injectable portacatheter was then tunneled through the subcutaneous tissues from the pocket to the dermatotomy and the port reservoir placed within the subcutaneous pocket. The venous access site was then serially dilated and a peel away vascular sheath placed over the wire. The wire was removed and the port catheter advanced into position under fluoroscopic guidance. The catheter tip is positioned in the cavoatrial junction. This was documented with a spot image. The portacatheter was then tested and found to flush and aspirate well. The port was flushed with saline and Huber needle was left in place at the completion. The pocket was then closed in two layers using first subdermal inverted interrupted absorbable sutures followed by a running subcuticular suture. The epidermis was then sealed with Dermabond. The dermatotomy at the venous access site was also seal with Dermabond and Steri-Strips. Patient tolerated the procedure well and remained hemodynamically stable throughout. No complications encountered and no significant blood loss  encountered . IMPRESSION: Status post right IJ port catheter placement. Catheter ready for use. Signed, Dulcy Fanny. Dellia Nims, RPVI [facility] Electronically Signed   By: Corrie Mckusick D.O.   On: 05/07/2018 17:19    ASSESSMENT & PLAN:  62 y.o. female with  1. 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 2. General LNadenopathy from lymphoma with b/l pleural effusion and b/l lower extremity weakness  3. Anemia/thrombocytopenia -  from Bone marrow involvement by lymphoma BM Bx confirms involved by T cell lymphoma.  4. 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   5. CMV IgM+ -- will ceck CMV PCR quantitative PLAN -labs today -PRBC match took time due to warm auto-antibody -will get 1 unit of PRBC this AM then CHOP then 2nd unit of PRBC -  informed consent signed. -continue Allopurinol 375m po daily -added TLS prophylaxis with rasburicase 332mtoday pre-ctx due to elevated uric acid and high risk of TLS -daily cbc/diff/platelets, cmp, uric acid -port a cath placed -nutritional consultation to optimize po intake -will plan to treat with CHOP - C1 in the hospital(today) -- will consider switching to Brentuximab + CHP as outpatient from C2. -granix daily for 5 days starting 24h after chemotherapy -will need outpatient PET/CT for baseline and response to treatment assessment. -PT/OT evaluation. -Will consider addition of Brentuximab for C2 instead of vincristine.  4.  Patient Active Problem List   Diagnosis Date Noted  . 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 Thrombocytopenia ([facility]06/18/2019  . Costochondritis 04/29/2018  . Subclinical hypothyroidism 04/29/2018  . Hyponatremia 04/29/2018  . Dyspnea 04/26/2018  . Diffuse lymphadenopathy 04/21/2018  . Angioimmunoblastic lymphoma ([place]05/31/2019  . Tobacco use 04/18/2018  . Pleural effusion   . Essential  hypertension, benign 05/21/2011  . Tobacco abuse 07/12/2009  . Atypical chest pain 07/12/2009   . The total time spent in the appointment was 35 minutes and more than 50% was on counseling and direct patient cares and co-ordination of cares with nursing and pharmacy    GaSullivan LoneD MSBerry CreekAHIVMS SCPaviliion Surgery Center LLCTLafayette Regional Rehabilitation Hospitalematology/Oncology Physician CoPhillips County Hospital(Office):       [phone] cell):  [phone]):           [phone]/20/2019 10:46 AM  I, ScBaldwin Jamaicaam acting as a scEducation administratoror Dr KaIrene Limbo  .I have reviewed the above documentation for accuracy and completeness, and I agree with the above. GaSullivan LoneD MS

## 2018-05-07 NOTE — Progress Notes (Signed)
START ON PATHWAY REGIMEN - Lymphoma and CLL     A cycle is every 21 days:     Cyclophosphamide      Doxorubicin      Vincristine      Prednisone   **Always confirm dose/schedule in your pharmacy ordering system**  Patient Characteristics: T-Cell Lymphoma, First Line, AITL (Angioimmunoblastic T-Cell Lymphoma) or ALCL (Anaplastic Large Cell Lymphoma) or Peripheral T-Cell, NOS, CD30 Negative Disease Type: Not Applicable Disease Type: T-Cell Lymphoma Disease Type: Not Applicable Line of Therapy: First Line [place] Stage: IV T-Cell Lymphoma Subtype: AITL (Angioimmunoblastic T-Cell Lymphoma) CD30 Status: CD30 Negative Intent of Therapy: Curative Intent, Discussed with Patient

## 2018-05-07 NOTE — Progress Notes (Signed)
2 units PRBC not ready to transfuse today per blood bank. Relayed message to MD. Pt notified.

## 2018-05-07 NOTE — Progress Notes (Signed)
PROGRESS NOTE    Deborah Cooley  VQQ:[id number] DOB: 12/22/1955 DOA: 04/23/2018 PCP: Patient, No Pcp Per    Brief Narrative:  62 yo with PMH of HTN, endometriosis s/p hysterectomy, tobacco use and recent hospitalization for multifocal lymphadenopathy and concern for lymphoma who presents for evaluation of recurrent shortness of breath and abdominal pain. History taken directly from the patient. Patient states that since discharge from hospital 3 days ago she has felt ongoing shortness of breath and swelling/pain in abdomen. Patient states both of these symptoms were present during previous hospitalization. She had some mild relief of symptoms with procedure done to drain fluid from her lungs while in hospital, but symptoms quickly returned once she got home. Since the day of discharge patient has experienced difficulty ambulating 2/2 shortness of breath and difficulty sleeping. Patient notes symptoms worse when laying flat and it feels as if she will drown/choke on her fluid at night. Because of this patient hasn't really slept since leaving the hospital. Patient thinks that her abdominal swelling noted last admission has gotten worse and she now has pain, described as increased fullness/pressure, in her upper abdomen whenever she takes in a deep breath. No fevers, difficulties with urination, bowel movements, headaches, or nausea/vomiting.   Upon arrival to the ED patient was afebrile, intermittently tachycardic, normotensive, and saturating >94% on room air. Patient's CBC with Hgb 10.9, WBC 7.7, platelets 178. BMP within normal limits. BNP = 78. ISTAT lactic acid elevated to 2.43, repeat 1.69. EKG with sinus tachycardia, but no acute changes concerning for ACS. ISTAT troponin within normal limits. Patient had CXR which showed increased bilateral pleural effusions, diffuse interstitial prominence, patchy airspace opacity in RML, and LLL atelectasis. Patient had ongoing SOB in ED and IMTS called for  admission.     Assessment & Plan:   Principal Problem:   Angioimmunoblastic lymphoma ([place]) Active Problems:   Pleural effusion   Diffuse lymphadenopathy   Dyspnea   Costochondritis   Subclinical hypothyroidism   Hyponatremia   Anemia in neoplastic disease   Thrombocytopenia (H[facility]) Diffuse lymphadenopathy concerning for T-cell Lymphoma Splenomegaly Bilateral pleural effusions, small Persistent DOE, continues to saturate well on RA. Final pathology report from the [place] confirmed initial biopsy result suggesting angioimmunoblastic T-cell lymphoma. Exhibits edema, electrolyte abnormalities and hematologic abnormalities consistent lymphoma (capillary leak, bone marrow infiltration). 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  > Bone marrow biopsy performed 6/18 > Hepatitis panel negative > CMV, EBV serologies positive > [facility] CONSULTED. - Oncology Consulted, Appreciate their recommendations - Could consider therapeutic thoracentesis if pleural effusions were to enlarge/declining respiratory status - Transfer to [facility], to initiate chemotherapy and further oncology workup - PO Decadron 20 mg BID (PO Benadryl 50 mg PRN insomnia) - Allopurinol and IVF for prophylaxis of tumor lysis syndrome, Uric Acid 16.7 (05/03/18)  - Albuterol, supplemental O2 and robitussin-dm prn - Continue PT  AKI NAGMA - Resolved Hyponatremia: Patient with hyponatremia which is downtrending. Initially thought to be secondary to hypervolemia however did not improve with lasix. Suspect related to capillary leak; difficult to manage given underlying process.  > Renal function improved Cr 0.81 > Na improving at 134. - Monitor AM BMET - I&O, Daily Weights  Lower Extremity Edema: Stable. Improves some with elevation. Likely 2/2 low albumin and capillary leak. Volume overload treated with lasix with improvement in patient  weight, lasix discontinued.  > Echocardiogram showed EF 60-65% and normal diastolic function - Elevate  lower extremities - Strict I&O, Daily Weights - Compression stocking or ACE wrap - AM BMP  Subclinical hypothyroidism Calcified mass in R lobe of thyroid: Incidental CT finding, again noted on admission CXR. Per CT Read: non-emergent follow up ultrasound for further evaluation. TSH elevated to 9.83. Free T4 lower end of normal at 0.99  Thrombocytopenia, Anemia: Likely secondary to bone marrow involvement. Holding Lovenox for now, especially given some hemoptysis this admission; will follow and transfuse if needed.  > Hgb 8.7>>8.4>>7.7 , stable at 7.7 this AM - Oncology recommends transfusion goal of >9 prior to chemotherapy, to be transfused at [place] to prevent delay of transfer - Daily CBC -Ordered blood transfusion yesterday.  Patient has not received it yet due to antibodies in the blood.  Hyperglycemia secondary to steroids going to start SSI.        DVT prophylaxis: TED hose Code Status: Full code Family Communication no family available Disposition Plan TBD   Consultants: ONC,IR  Procedures: BM biopsy 05/06/2018 Antimicrobials:NONE  Subjective: Feels okay sitting up in chair and drainage from her back has been decreased the dressing was changed once after she came here and second dressing was applied last night which is come in place.   Objective: Vitals:   05/06/18 0601 05/06/18 1310 05/06/18 2128 05/07/18 0559  BP: (!) 125/55 (!) 103/45 117/60 113/61  Pulse: 93 97 (!) 105 93  Resp: '20 18 14 15  ' Temp: 97.7 F (36.5 C) 97.6 F (36.4 C) 97.6 F (36.4 C) (!) 97.5 F (36.4 C)  TempSrc: Axillary Oral Oral Oral  SpO2: 95% 96% 99% 100%  Weight: 65.2 kg (143 lb 11.8 oz)     Height:        Intake/Output Summary (Last 24 hours) at 05/07/2018 1046 Last data filed at 05/07/2018 0842 Gross per 24 hour  Intake 840 ml  Output 900 ml  Net -60 ml   Filed  Weights   05/04/18 0203 05/05/18 0329 05/06/18 0601  Weight: 64.8 kg (142 lb 14.4 oz) 69.6 kg (153 lb 7 oz) 65.2 kg (143 lb 11.8 oz)    Examination: Sitting up in the chair in no acute distress  General exam: Appears calm and comfortable  Respiratory system: Clear to auscultation. Respiratory effort normal. Cardiovascular system: S1 & S2 heard, RRR. No JVD, murmurs, rubs, gallops or clicks. No pedal edema. Gastrointestinal system: Abdomen is nondistended, soft and nontender. No organomegaly or masses felt. Normal bowel sounds heard. Central nervous system: Alert and oriented. No focal neurological deficits. Extremities: 2+ edema Skin: No rashes, lesions or ulcers Psychiatry: Judgement and insight appear normal. Mood & affect appropriate.     Data Reviewed: I have personally reviewed following labs and imaging studies  CBC: Recent Labs  Lab 05/03/18 0533 05/04/18 0819 05/05/18 0536 05/06/18 0554 05/07/18 0408  WBC 8.1 6.7 5.7 5.7 4.6  NEUTROABS 5.2 4.7  --   --  2.9  HGB 8.7* 8.4* 7.7* 7.7* 7.8*  HCT 23.6* 24.4* 22.7* 22.7* 23.2*  MCV 93.3 87.1 87.6 93.4 91.0  PLT 67* 68* 89* 118* 762*   Basic Metabolic Panel: Recent Labs  Lab 05/03/18 0533 05/04/18 0819 05/05/18 0536 05/06/18 0554 05/07/18 0408  NA 124* 124* 126* 129* 134*  K 3.9 4.4 4.4 4.7 5.0  CL 92* 94* 98* 100* 105  CO2 25 19* 21* 22 26  GLUCOSE 142* 173* 201* 189* 238*  BUN 23* 34* 30* 29* 31*  CREATININE 1.31* 1.12* 0.86 0.72 0.81  CALCIUM 8.5*  8.2* 8.1* 8.4* 8.3*  MG  --   --   --   --  2.3  PHOS  --   --   --   --  3.6   GFR: Estimated Creatinine Clearance: 63.8 mL/min (by C-G formula based on SCr of 0.81 mg/dL). Liver Function Tests: Recent Labs  Lab 05/01/18 0833 05/02/18 0659 05/03/18 0533 05/07/18 0408  AST '27 31 27 24  ' ALT 13* 12* 12* 19  ALKPHOS 55 52 55 52  BILITOT 0.8 1.1 1.1 0.6  PROT 7.6 7.7 7.8 8.1  ALBUMIN 2.6* 2.6* 2.5* 2.8*   No results for input(s): LIPASE, AMYLASE in the  last 168 hours. No results for input(s): AMMONIA in the last 168 hours. Coagulation Profile: Recent Labs  Lab 05/03/18 1407  INR 1.03   Cardiac Enzymes: No results for input(s): CKTOTAL, CKMB, CKMBINDEX, TROPONINI in the last 168 hours. BNP (last 3 results) No results for input(s): PROBNP in the last 8760 hours. HbA1C: No results for input(s): HGBA1C in the last 72 hours. CBG: Recent Labs  Lab 05/05/18 2116 05/06/18 0733 05/06/18 1217 05/06/18 1351 05/06/18 2125  GLUCAP 276* 173* 198* 202* 165*   Lipid Profile: No results for input(s): CHOL, HDL, LDLCALC, TRIG, CHOLHDL, LDLDIRECT in the last 72 hours. Thyroid Function Tests: No results for input(s): TSH, T4TOTAL, FREET4, T3FREE, THYROIDAB in the last 72 hours. Anemia Panel: No results for input(s): VITAMINB12, FOLATE, FERRITIN, TIBC, IRON, RETICCTPCT in the last 72 hours. Sepsis Labs: No results for input(s): PROCALCITON, LATICACIDVEN in the last 168 hours.  No results found for this or any previous visit (from the past 240 hour(s)).       Radiology Studies: Ct Bone Marrow Biopsy & Aspiration  Result Date: 05/05/2018 INDICATION: Lymphoma EXAM: CT BONE MARROW BIOPSY AND ASPIRATION MEDICATIONS: None. ANESTHESIA/SEDATION: Fentanyl 75 mcg IV; Versed 2 mg IV Moderate Sedation Time:  32 minutes The patient was continuously monitored during the procedure by the interventional radiology nurse under my direct supervision. FLUOROSCOPY TIME:  Fluoroscopy Time:  minutes  seconds ( mGy). COMPLICATIONS: None immediate. PROCEDURE: Informed written consent was obtained from the patient after a thorough discussion of the procedural risks, benefits and alternatives. All questions were addressed. Maximal Sterile Barrier Technique was utilized including caps, mask, sterile gowns, sterile gloves, sterile drape, hand hygiene and skin antiseptic. A timeout was performed prior to the initiation of the procedure. Under CT guidance, a(n) 11 gauge  guide needle was advanced into the right iliac bone. Aspirates and a core were obtained. Post biopsy images demonstrate . Patient tolerated the procedure well without complication. Vital sign monitoring by nursing staff during the procedure will continue as patient is in the special procedures unit for post procedure observation. FINDINGS: The images document guide needle placement within the right iliac bone. Post biopsy images demonstrate no hemorrhage. IMPRESSION: Successful CT-guided right iliac bone marrow aspirate and core. Electronically Signed   By: Marybelle Killings M.D.   On: 05/05/2018 13:33        Scheduled Meds: . sodium chloride   Intravenous Once  . allopurinol  300 mg Oral Daily  . feeding supplement  1 Container Oral TID BM  . insulin aspart  0-5 Units Subcutaneous QHS  . insulin aspart  0-9 Units Subcutaneous TID WC  . ramelteon  8 mg Oral QHS  . sodium chloride flush  3 mL Intravenous Q12H   Continuous Infusions: . sodium chloride 125 mL/hr at 05/06/18 1351     LOS: 12  days     Georgette Shell, MD [facility]  If 7PM-7AM, please contact night-coverage [email or web address] Password [facility] 05/07/2018, 10:46 AM

## 2018-05-07 NOTE — Progress Notes (Addendum)
Referring Physician(s): [place]  Supervising Physician: Corrie Mckusick  Patient Status:  [facility] - In-pt  Chief Complaint:  lymphoma  Subjective: Patient familiar to IR service from recent bone marrow biopsy on 05/05/2018.  She has been recently diagnosed with T-cell lymphoma and request received today for Port-A-Cath placement for chemotherapy.  She currently denies fever, headache, chest pain, worsening dyspnea, cough, back pain, nausea vomiting or bleeding.  She does have some abdominal discomfort and bilateral foot pain.  Past Medical History:  Diagnosis Date  . Bilateral pleural effusion 04/17/2018  . Endometriosis   . Essential hypertension, benign   . Family history of adverse reaction to anesthesia    "sister stops breathing I think" (04/18/2018)  . Heart murmur   . Mixed hyperlipidemia   . Pneumonia 04/17/2018   Past Surgical History:  Procedure Laterality Date  . ABDOMINAL HYSTERECTOMY  1970s/1980s   for endometriosis  . Arthroscopic right shoulder surgery    . AXILLARY LYMPH NODE BIOPSY Left 04/21/2018   Procedure: LEFT AXILLARY LYMPH NODE BIOPSY;  Surgeon: Kieth Brightly, Arta Bruce, MD;  Location: [facility];  Service: General;  Laterality: Left;  . CARPAL TUNNEL RELEASE Right   . GANGLION CYST EXCISION Right   . MULTIPLE TOOTH EXTRACTIONS    . OOPHORECTOMY  1997  . PARTIAL HYSTERECTOMY  1984  . Right hand surgery    . SHOULDER OPEN ROTATOR CUFF REPAIR Right   . VESICOVAGINAL FISTULA CLOSURE W/ TAH        Allergies: Patient has no known allergies.  Medications: Prior to Admission medications   Medication Sig Start Date End Date Taking? Authorizing Provider  predniSONE (DELTASONE) 20 MG tablet Take 3 tablets (60 mg total) by mouth daily. Take on days 1-5 of chemotherapy. 05/07/18   Brunetta Genera, MD     Vital Signs: BP 113/61 (BP Location: Right Arm)   Pulse 93   Temp (!) 97.5 F (36.4 C) (Oral)   Resp 15   Ht '5\' 2"'  (1.575 m)   Wt 143 lb 11.8 oz (65.2  kg)   SpO2 100%   BMI 26.29 kg/m   Physical Exam awake, alert.  Chest with diminished breath sounds bases, more so on the right.  Heart with regular rate and rhythm.  Abdomen slightly distended, positive bowel sounds, some mild generalized tenderness to palpation, splenomegaly.  Trace-1+ bilateral lower extremity edema.  Intact clean bandage at puncture site from prior right iliac bone biopsy.  Imaging: Ct Bone Marrow Biopsy & Aspiration  Result Date: 05/05/2018 INDICATION: Lymphoma EXAM: CT BONE MARROW BIOPSY AND ASPIRATION MEDICATIONS: None. ANESTHESIA/SEDATION: Fentanyl 75 mcg IV; Versed 2 mg IV Moderate Sedation Time:  32 minutes The patient was continuously monitored during the procedure by the interventional radiology nurse under my direct supervision. FLUOROSCOPY TIME:  Fluoroscopy Time:  minutes  seconds ( mGy). COMPLICATIONS: None immediate. PROCEDURE: Informed written consent was obtained from the patient after a thorough discussion of the procedural risks, benefits and alternatives. All questions were addressed. Maximal Sterile Barrier Technique was utilized including caps, mask, sterile gowns, sterile gloves, sterile drape, hand hygiene and skin antiseptic. A timeout was performed prior to the initiation of the procedure. Under CT guidance, a(n) 11 gauge guide needle was advanced into the right iliac bone. Aspirates and a core were obtained. Post biopsy images demonstrate . Patient tolerated the procedure well without complication. Vital sign monitoring by nursing staff during the procedure will continue as patient is in the special procedures unit for post  procedure observation. FINDINGS: The images document guide needle placement within the right iliac bone. Post biopsy images demonstrate no hemorrhage. IMPRESSION: Successful CT-guided right iliac bone marrow aspirate and core. Electronically Signed   By: Marybelle Killings M.D.   On: 05/05/2018 13:33    Labs:  CBC: Recent Labs     05/04/18 0819 05/05/18 0536 05/06/18 0554 05/07/18 0408  WBC 6.7 5.7 5.7 4.6  HGB 8.4* 7.7* 7.7* 7.8*  HCT 24.4* 22.7* 22.7* 23.2*  PLT 68* 89* 118* 135*    COAGS: Recent Labs    04/21/18 0625 05/03/18 1407  INR 1.10 1.03    BMP: Recent Labs    05/04/18 0819 05/05/18 0536 05/06/18 0554 05/07/18 0408  NA 124* 126* 129* 134*  K 4.4 4.4 4.7 5.0  CL 94* 98* 100* 105  CO2 19* 21* 22 26  GLUCOSE 173* 201* 189* 238*  BUN 34* 30* 29* 31*  CALCIUM 8.2* 8.1* 8.4* 8.3*  CREATININE 1.12* 0.86 0.72 0.81  GFRNONAA 52* >60 >60 >60  GFRAA 60* >60 >60 >60    LIVER FUNCTION TESTS: Recent Labs    05/01/18 0833 05/02/18 0659 05/03/18 0533 05/07/18 0408  BILITOT 0.8 1.1 1.1 0.6  AST '27 31 27 24  ' ALT 13* 12* 12* 19  ALKPHOS 55 52 55 52  PROT 7.6 7.7 7.8 8.1  ALBUMIN 2.6* 2.6* 2.5* 2.8*    Assessment and Plan: Patient with newly diagnosed T-cell lymphoma; request received from oncology for Port-A-Cath placement.Risks and benefits of image guided port-a-catheter placement was discussed with the patient including, but not limited to bleeding, infection, pneumothorax, or fibrin sheath development and need for additional procedures.  All of the patient's questions were answered, patient is agreeable to proceed. Consent signed and in chart.  Procedure tentatively planned for later this afternoon.   Electronically Signed: D. Rowe Robert, PA-C 05/07/2018, 10:40 AM   I spent a total of 20 minutes at the the patient's bedside AND on the patient's hospital floor or unit, greater than 50% of which was counseling/coordinating care for Port-A-Cath placement    Patient ID: Deborah Cooley, female   DOB: 1956-09-15, 62 y.o.   MRN: [id number]

## 2018-05-07 NOTE — Procedures (Signed)
Interventional Radiology Procedure Note  Procedure: Placement of a right IJ approach single lumen PowerPort.  Tip is positioned at the superior cavoatrial junction and catheter is ready for immediate use.  Complications: None Recommendations:  - Ok to use - Do not submerge for 7 days - Routine line care   Signed,  Zamyia Gowell S. Tobiah Celestine, DO   

## 2018-05-08 ENCOUNTER — Inpatient Hospital Stay (HOSPITAL_COMMUNITY): Payer: Self-pay

## 2018-05-08 DIAGNOSIS — Z9189 Other specified personal risk factors, not elsewhere classified: Secondary | ICD-10-CM

## 2018-05-08 LAB — BASIC METABOLIC PANEL
Anion gap: 3 — ABNORMAL LOW (ref 5–15)
BUN: 26 mg/dL — AB (ref 6–20)
CO2: 27 mmol/L (ref 22–32)
CREATININE: 0.66 mg/dL (ref 0.44–1.00)
Calcium: 8.2 mg/dL — ABNORMAL LOW (ref 8.9–10.3)
Chloride: 106 mmol/L (ref 101–111)
GFR calc Af Amer: >60 mL/min (ref >60)
GLUCOSE: 133 mg/dL — AB (ref 65–99)
Potassium: 4.3 mmol/L (ref 3.5–5.1)
Sodium: 136 mmol/L (ref 135–145)

## 2018-05-08 LAB — MULTIPLE MYELOMA PANEL, SERUM
ALPHA2 GLOB SERPL ELPH-MCNC: 0.6 g/dL (ref 0.4–1.0)
Albumin SerPl Elph-Mcnc: 2.9 g/dL (ref 2.9–4.4)
Albumin/Glob SerPl: 0.6 — ABNORMAL LOW (ref 0.7–1.7)
Alpha 1: 0.1 g/dL (ref 0.0–0.4)
B-Globulin SerPl Elph-Mcnc: 0.9 g/dL (ref 0.7–1.3)
Gamma Glob SerPl Elph-Mcnc: 3.5 g/dL — ABNORMAL HIGH (ref 0.4–1.8)
Globulin, Total: 5.1 g/dL — ABNORMAL HIGH (ref 2.2–3.9)
IGA: 292 mg/dL (ref 87–352)
IGM (IMMUNOGLOBULIN M), SRM: 492 mg/dL — AB (ref 26–217)
IgG (Immunoglobin G), Serum: 3696 mg/dL — ABNORMAL HIGH (ref 700–1600)
TOTAL PROTEIN ELP: 8 g/dL (ref 6.0–8.5)

## 2018-05-08 LAB — CBC
HCT: 21.7 % — ABNORMAL LOW (ref 36.0–46.0)
Hemoglobin: 7.2 g/dL — ABNORMAL LOW (ref 12.0–15.0)
MCH: 31 pg (ref 26.0–34.0)
MCHC: 33.2 g/dL (ref 30.0–36.0)
MCV: 93.5 fL (ref 78.0–100.0)
PLATELETS: 124 10*3/uL — AB (ref 150–400)
RBC: 2.32 MIL/uL — AB (ref 3.87–5.11)
RDW: 18.8 % — AB (ref 11.5–15.5)
WBC: 4.5 10*3/uL (ref 4.0–10.5)

## 2018-05-08 LAB — GLUCOSE, CAPILLARY
Glucose-Capillary: 134 mg/dL — ABNORMAL HIGH (ref 65–99)
Glucose-Capillary: 123 mg/dL — ABNORMAL HIGH (ref 65–99)
Glucose-Capillary: 137 mg/dL — ABNORMAL HIGH (ref 65–99)
Glucose-Capillary: 182 mg/dL — ABNORMAL HIGH (ref 65–99)
Glucose-Capillary: 201 mg/dL — ABNORMAL HIGH (ref 65–99)

## 2018-05-08 LAB — URIC ACID: Uric Acid, Serum: 8.2 mg/dL — ABNORMAL HIGH (ref 2.3–6.6)

## 2018-05-08 LAB — PREPARE RBC (CROSSMATCH)

## 2018-05-08 MED ORDER — ALTEPLASE 2 MG IJ SOLR
2.0000 mg | Freq: Once | INTRAMUSCULAR | Status: DC | PRN
  Filled 2018-05-08: qty 2

## 2018-05-08 MED ORDER — SODIUM CHLORIDE 0.9% FLUSH: 10.0000 mL | INTRAVENOUS | Status: DC | PRN

## 2018-05-08 MED ORDER — HEPARIN SOD (PORK) LOCK FLUSH 100 UNIT/ML IV SOLN
250.0000 [IU] | Freq: Once | INTRAVENOUS | Status: DC | PRN
  Filled 2018-05-08: qty 2.5

## 2018-05-08 MED ORDER — IPRATROPIUM-ALBUTEROL 0.5-2.5 (3) MG/3ML IN SOLN: 3.0000 mL | Freq: Four times a day (QID) | RESPIRATORY_TRACT | Status: DC

## 2018-05-08 MED ORDER — SODIUM CHLORIDE 0.9% FLUSH: 3.0000 mL | INTRAVENOUS | Status: DC | PRN

## 2018-05-08 MED ORDER — FUROSEMIDE 10 MG/ML IJ SOLN
20.0000 mg | Freq: Once | INTRAMUSCULAR | Status: AC
  Administered 2018-05-08: 20 mg via INTRAVENOUS
  Filled 2018-05-08: qty 2

## 2018-05-08 MED ORDER — PALONOSETRON HCL INJECTION 0.25 MG/5ML
0.2500 mg | Freq: Once | INTRAVENOUS | Status: AC
  Administered 2018-05-08: 0.25 mg via INTRAVENOUS
  Filled 2018-05-08: qty 5

## 2018-05-08 MED ORDER — HOT PACK MISC ONCOLOGY
1.0000 | Freq: Once | Status: AC | PRN
  Filled 2018-05-08: qty 1

## 2018-05-08 MED ORDER — SODIUM CHLORIDE 0.9 % IV SOLN
10.0000 mg | Freq: Once | INTRAVENOUS | Status: AC
  Administered 2018-05-08: 10 mg via INTRAVENOUS
  Filled 2018-05-08 (×2): qty 1

## 2018-05-08 MED ORDER — IPRATROPIUM-ALBUTEROL 0.5-2.5 (3) MG/3ML IN SOLN: 3.0000 mL | Freq: Four times a day (QID) | RESPIRATORY_TRACT | Status: DC | PRN

## 2018-05-08 MED ORDER — PROCHLORPERAZINE EDISYLATE 10 MG/2ML IJ SOLN
10.0000 mg | Freq: Three times a day (TID) | INTRAMUSCULAR | Status: DC | PRN
  Administered 2018-05-11 – 2018-05-12 (×2): 10 mg via INTRAVENOUS
  Filled 2018-05-08 (×2): qty 2

## 2018-05-08 MED ORDER — COLD PACK MISC ONCOLOGY
1.0000 | Freq: Once | Status: AC | PRN
  Filled 2018-05-08: qty 1

## 2018-05-08 MED ORDER — DOXORUBICIN HCL CHEMO IV INJECTION 2 MG/ML
50.0000 mg/m2 | Freq: Once | INTRAVENOUS | Status: AC
  Administered 2018-05-08: 84 mg via INTRAVENOUS
  Filled 2018-05-08: qty 42

## 2018-05-08 MED ORDER — SODIUM CHLORIDE 0.9 % IV SOLN
Freq: Once | INTRAVENOUS | Status: AC
  Administered 2018-05-19: 16:00:00 via INTRAVENOUS

## 2018-05-08 MED ORDER — SODIUM CHLORIDE 0.9 % IV SOLN
2.0000 mg | Freq: Once | INTRAVENOUS | Status: AC
  Administered 2018-05-08: 2 mg via INTRAVENOUS
  Filled 2018-05-08: qty 2

## 2018-05-08 MED ORDER — SODIUM CHLORIDE 0.9 % IV SOLN
750.0000 mg/m2 | Freq: Once | INTRAVENOUS | Status: AC
  Administered 2018-05-08: 1260 mg via INTRAVENOUS
  Filled 2018-05-08: qty 63

## 2018-05-08 MED ORDER — DIPHENHYDRAMINE HCL 25 MG PO CAPS
25.0000 mg | ORAL_CAPSULE | Freq: Once | ORAL | Status: AC
Start: 2018-05-08 — End: 2018-05-08
  Administered 2018-05-08: 25 mg via ORAL

## 2018-05-08 MED ORDER — SODIUM CHLORIDE 0.9 % IV SOLN
3.0000 mg | Freq: Once | INTRAVENOUS | Status: AC
  Administered 2018-05-08: 3 mg via INTRAVENOUS
  Filled 2018-05-08: qty 2

## 2018-05-08 MED ORDER — PREDNISONE 20 MG PO TABS
60.0000 mg | ORAL_TABLET | Freq: Every day | ORAL | Status: DC
  Administered 2018-05-08 – 2018-05-11 (×4): 60 mg via ORAL
  Filled 2018-05-08 (×4): qty 3

## 2018-05-08 NOTE — ED Provider Notes (Signed)
[facility]  SURGICAL Provider Note   CSN: [id number] Arrival date & time: 04/17/18  2221     History   Chief Complaint Chief Complaint  Patient presents with  . Abdominal Pain  . Shortness of Breath    HPI Deborah Cooley is a 62 y.o. female.  HPI Patient presents to the emergency department with cough with abdominal pain.  The patient states that the symptoms have been ongoing over the last few weeks.  Patient states that she feels more short of breath that she has in the past.  Patient states she has been having some weakness as well.  Patient is a smoker.  Patient states she did not take any medications prior to arrival.  The patient denies chest pain,  headache,blurred vision, neck pain, fever,  numbness, dizziness, anorexia, edema, nausea, vomiting, diarrhea, rash, back pain, dysuria, hematemesis, bloody stool, near syncope, or syncope. Past Medical History:  Diagnosis Date  . Bilateral pleural effusion 04/17/2018  . Endometriosis   . Essential hypertension, benign   . Family history of adverse reaction to anesthesia    "sister stops breathing I think" (04/18/2018)  . Heart murmur   . Mixed hyperlipidemia   . Pneumonia 04/17/2018    Patient Active Problem List   Diagnosis Date Noted  . At high risk of tumor lysis syndrome   . 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 Thrombocytopenia ([place]) 05/06/2018  . Costochondritis 04/29/2018  . Subclinical hypothyroidism 04/29/2018  . Hyponatremia 04/29/2018  . Dyspnea 04/26/2018  . Diffuse lymphadenopathy 04/21/2018  . 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 Tobacco use 04/18/2018  . Pleural effusion   . Essential hypertension, benign 05/21/2011  . Tobacco abuse 07/12/2009  . Atypical chest pain 07/12/2009    Past Surgical History:  Procedure Laterality Date  . ABDOMINAL HYSTERECTOMY  1970s/1980s   for endometriosis  . Arthroscopic right shoulder surgery    . AXILLARY LYMPH NODE  BIOPSY Left 04/21/2018   Procedure: LEFT AXILLARY LYMPH NODE BIOPSY;  Surgeon: Kieth Brightly, Arta Bruce, MD;  Location: [facility];  Service: General;  Laterality: Left;  . CARPAL TUNNEL RELEASE Right   . GANGLION CYST EXCISION Right   . IR IMAGING GUIDED PORT INSERTION  05/07/2018  . MULTIPLE TOOTH EXTRACTIONS    . OOPHORECTOMY  1997  . PARTIAL HYSTERECTOMY  1984  . Right hand surgery    . SHOULDER OPEN ROTATOR CUFF REPAIR Right   . VESICOVAGINAL FISTULA CLOSURE W/ TAH       OB History   None      Home Medications    Prior to Admission medications   Medication Sig Start Date End Date Taking? Authorizing Provider  predniSONE (DELTASONE) 20 MG tablet Take 3 tablets (60 mg total) by mouth daily. Take on days 1-5 of chemotherapy. 05/07/18   Brunetta Genera, MD    Family History Family History  Problem Relation Age of Onset  . Diabetes Mother        age 72  . Heart attack Father        died age 48's  . Hypertension Father   . Cancer Sister        breast cancer diagonsed age 90 years  . Diabetes Unknown   . Heart disease Unknown        Female <55  . Arthritis Unknown   . Asthma Unknown   . Hyperlipidemia Other        several siblings  . Thyroid disease  Sister        one sister with thyroid disease    Social History Social History   Tobacco Use  . Smoking status: Current Every Day Smoker    Packs/day: 1.50    Years: 46.00    Pack years: 69.00    Types: Cigarettes  . Smokeless tobacco: Never Used  Substance Use Topics  . Alcohol use: Not Currently    Comment: 04/18/2018 "quit years ago"  . Drug use: Not Currently    Comment: "back in my 37s"     Allergies   Patient has no known allergies.   Review of Systems Review of Systems All other systems negative except as documented in the HPI. All pertinent positives and negatives as reviewed in the HPI.  Physical Exam Updated Vital Signs BP (!) 113/57 (BP Location: Right Arm)   Pulse (!) 109   Temp 98.4 F (36.9  C) (Oral)   Resp 20   Ht 5\' 2"  (1.575 m)   Wt 56.2 kg (124 lb)   SpO2 91%   BMI 22.68 kg/m   Physical Exam  Constitutional: She is oriented to person, place, and time. She appears well-developed and well-nourished. No distress.  HENT:  Head: Normocephalic and atraumatic.  Mouth/Throat: Oropharynx is clear and moist.  Eyes: Pupils are equal, round, and reactive to light.  Neck: Normal range of motion. Neck supple.  Cardiovascular: Normal rate, regular rhythm and normal heart sounds. Exam reveals no gallop and no friction rub.  No murmur heard. Pulmonary/Chest: Effort normal. No respiratory distress. She has decreased breath sounds in the right lower field and the left lower field. She has no wheezes.  Abdominal: Soft. Bowel sounds are normal. She exhibits no distension. There is generalized tenderness. There is no rigidity and no guarding.  Neurological: She is alert and oriented to person, place, and time. She exhibits normal muscle tone. Coordination normal.  Skin: Skin is warm and dry. Capillary refill takes less than 2 seconds. No rash noted. No erythema.  Psychiatric: She has a normal mood and affect. Her behavior is normal.  Nursing note and vitals reviewed.    ED Treatments / Results  Labs (all labs ordered are listed, but only abnormal results are displayed) Labs Reviewed  COMPREHENSIVE METABOLIC PANEL - Abnormal; Notable for the following components:      Result Value   Sodium 131 (*)    Chloride 98 (*)    Glucose, Bld 122 (*)    Creatinine, Ser 1.06 (*)    Albumin 3.0 (*)    ALT 12 (*)    GFR calc non Af Amer 55 (*)    All other components within normal limits  CBC - Abnormal; Notable for the following components:   Hemoglobin 11.8 (*)    RDW 15.6 (*)    All other components within normal limits  URINALYSIS, ROUTINE W REFLEX MICROSCOPIC - Abnormal; Notable for the following components:   Color, Urine AMBER (*)    APPearance HAZY (*)    Ketones, ur 5 (*)     Protein, ur 30 (*)    Bacteria, UA RARE (*)    All other components within normal limits  BODY FLUID CELL COUNT WITH DIFFERENTIAL - Abnormal; Notable for the following components:   Appearance, Fluid HAZY (*)    WBC, Fluid 6,715 (*)    Monocyte-Macrophage-Serous Fluid 24 (*)    All other components within normal limits  LACTATE DEHYDROGENASE, PLEURAL OR PERITONEAL FLUID - Abnormal; Notable for the  following components:   LD, Fluid 164 (*)    All other components within normal limits  LACTATE DEHYDROGENASE - Abnormal; Notable for the following components:   LDH 261 (*)    All other components within normal limits  BASIC METABOLIC PANEL - Abnormal; Notable for the following components:   Sodium 129 (*)    Chloride 96 (*)    Glucose, Bld 150 (*)    Calcium 8.7 (*)    All other components within normal limits  CBC WITH DIFFERENTIAL/PLATELET - Abnormal; Notable for the following components:   RBC 3.62 (*)    Hemoglobin 10.5 (*)    HCT 31.4 (*)    Monocytes Absolute 1.2 (*)    All other components within normal limits  CBC - Abnormal; Notable for the following components:   Hemoglobin 11.4 (*)    HCT 35.6 (*)    RDW 15.9 (*)    All other components within normal limits  COMPREHENSIVE METABOLIC PANEL - Abnormal; Notable for the following components:   Sodium 132 (*)    Glucose, Bld 135 (*)    Albumin 2.8 (*)    ALT 13 (*)    All other components within normal limits  BODY FLUID CULTURE  SURGICAL PCR SCREEN  LIPASE, BLOOD  HIV ANTIBODY (ROUTINE TESTING)  GLUCOSE, PLEURAL OR PERITONEAL FLUID  PROTEIN, PLEURAL OR PERITONEAL FLUID  PROTEIN, TOTAL  PROTIME-INR  I-STAT TROPONIN, ED  I-STAT TROPONIN, ED  I-STAT CG4 LACTIC ACID, ED  SURGICAL PATHOLOGY  CYTOLOGY - NON PAP  SURGICAL PATHOLOGY  SURGICAL PATHOLOGY    EKG EKG Interpretation  Date/Time:  Thursday Apr 17 2018 23:03:46 EDT Ventricular Rate:  117 PR Interval:  128 QRS Duration: 80 QT Interval:  308 QTC  Calculation: 429 R Axis:   80 Text Interpretation:  Sinus tachycardia Possible Anterior infarct , age undetermined Abnormal ECG nonspecific ST changes No old tracing to compare Confirmed by Sherwood Gambler [phone]) on 04/18/2018 4:23:41 PM   Radiology Dg Chest 1 View  Result Date: 05/08/2018 CLINICAL DATA:  Wheezing EXAM: CHEST  1 VIEW COMPARISON:  Chest x-ray of 04/23/2017 FINDINGS: There has been increase in volume of right pleural effusion with increasing right basilar atelectasis. A smaller left pleural effusion remains with probable partial atelectasis at the left lung base as well. The heart is mildly enlarged. A Port-A-Cath is now present with the tip overlying the mid lower SVC. No pneumothorax is seen. No bony abnormality is noted. IMPRESSION: 1. Increase in volume of right pleural effusion with increasing right basilar atelectasis. 2. Small left pleural effusion remains with probable partial atelectasis of the left lung base as well. 3. Port-A-Cath tip overlies the mid lower SVC. Electronically Signed   By: Ivar Drape M.D.   On: 05/08/2018 11:51   Ir Imaging Guided Port Insertion  Result Date: 05/07/2018 INDICATION: 62 year old female with a history of lymphoma. She has been referred for port catheter. EXAM: IMPLANTED PORT A CATH PLACEMENT WITH ULTRASOUND AND FLUOROSCOPIC GUIDANCE MEDICATIONS: 2 g Ancef; The antibiotic was administered within an appropriate time interval prior to skin puncture. ANESTHESIA/SEDATION: Moderate (conscious) sedation was employed during this procedure. A total of Versed mg and Fentanyl 100 mcg was administered intravenously. Moderate Sedation Time: 20 minutes. The patient's level of consciousness and vital signs were monitored continuously by radiology nursing throughout the procedure under my direct supervision. FLUOROSCOPY TIME:  0 minutes, 6 seconds (1.5 mGy) COMPLICATIONS: None PROCEDURE: The procedure, risks, benefits, and alternatives were explained to the  patient. Questions regarding the procedure were encouraged and answered. The patient understands and consents to the procedure. Ultrasound survey was performed with images stored and sent to PACs. The right neck and chest was prepped with chlorhexidine, and draped in the usual sterile fashion using maximum barrier technique (cap and mask, sterile gown, sterile gloves, large sterile sheet, hand hygiene and cutaneous antiseptic). Antibiotic prophylaxis was provided with 2.0g Ancef administered IV one hour prior to skin incision. Local anesthesia was attained by infiltration with 1% lidocaine without epinephrine. Ultrasound demonstrated patency of the right internal jugular vein, and this was documented with an image. Under real-time ultrasound guidance, this vein was accessed with a 21 gauge micropuncture needle and image documentation was performed. A small dermatotomy was made at the access site with an 11 scalpel. A 0.018" wire was advanced into the SVC and used to estimate the length of the internal catheter. The access needle exchanged for a 69F micropuncture vascular sheath. The 0.018" wire was then removed and a 0.035" wire advanced into the IVC. An appropriate location for the subcutaneous reservoir was selected below the clavicle and an incision was made through the skin and underlying soft tissues. The subcutaneous tissues were then dissected using a combination of blunt and sharp surgical technique and a pocket was formed. A single lumen power injectable portacatheter was then tunneled through the subcutaneous tissues from the pocket to the dermatotomy and the port reservoir placed within the subcutaneous pocket. The venous access site was then serially dilated and a peel away vascular sheath placed over the wire. The wire was removed and the port catheter advanced into position under fluoroscopic guidance. The catheter tip is positioned in the cavoatrial junction. This was documented with a spot image. The  portacatheter was then tested and found to flush and aspirate well. The port was flushed with saline and Huber needle was left in place at the completion. The pocket was then closed in two layers using first subdermal inverted interrupted absorbable sutures followed by a running subcuticular suture. The epidermis was then sealed with Dermabond. The dermatotomy at the venous access site was also seal with Dermabond and Steri-Strips. Patient tolerated the procedure well and remained hemodynamically stable throughout. No complications encountered and no significant blood loss encountered . IMPRESSION: Status post right IJ port catheter placement. Catheter ready for use. Signed, Dulcy Fanny. Dellia Nims, RPVI [facility] Radiology Electronically Signed   By: Corrie Mckusick D.O.   On: 05/07/2018 17:19    Procedures Procedures (including critical care time)  Medications Ordered in ED Medications  iopamidol (ISOVUE-370) 76 % injection 100 mL ( Intravenous Canceled Entry 04/18/18 1439)  cefTRIAXone (ROCEPHIN) 1 g in sodium chloride 0.9 % 100 mL IVPB (0 g Intravenous Stopped 04/18/18 1054)  azithromycin (ZITHROMAX) 500 mg in sodium chloride 0.9 % 250 mL IVPB (0 mg Intravenous Stopped 04/18/18 1155)  methylPREDNISolone sodium succinate (SOLU-MEDROL) 125 mg/2 mL injection 60 mg (60 mg Intravenous Given 04/18/18 1345)  ceFAZolin (ANCEF) IVPB 1 g/50 mL premix ( Intravenous MAR Unhold 04/21/18 1105)  Chlorhexidine Gluconate Cloth 2 % PADS 6 each (6 each Topical Given 04/21/18 0625)  albuterol (PROVENTIL) (2.5 MG/3ML) 0.083% nebulizer solution 2.5 mg (2.5 mg Nebulization Given 04/21/18 1043)     Initial Impression / Assessment and Plan / ED Course  I have reviewed the triage vital signs and the nursing notes.  Pertinent labs & imaging results that were available during my care of the patient were reviewed  by me and considered in my medical decision making (see chart for  details).     She will be admitted to the hospital for what looks like a new diagnosis of lymphoma along with a possible early infectious process in her lungs.  Patient is advised of the plan and all questions were answered.  I spoke with the [facility] who will admit the patient.  Patient is started on antibiotics.  Final Clinical Impressions(s) / ED Diagnoses   Final diagnoses:  Community acquired pneumonia of right middle lobe of lung ([place])  Lymphoma, unspecified body region, unspecified lymphoma type ([place])  Pleural effusion  Pneumonia  Diffuse lymphadenopathy    ED Discharge Orders        Ordered    Increase activity slowly     04/21/18 1506    Discharge instructions    Comments:  Thank you for allowing [place] to care for you  Your your symptoms were due to enlarged lymph nodes. - A lymph node was biopsied to perform microscopic examination to determine the source - A referral has been placed to Hematology/Oncology -- You can contact [facility] at: [phone] -- You can visit [email or web address] for more information  Please follow up with PCP when able   04/21/18 1506    Call MD for:  temperature >100.4     04/21/18 1506    Call MD for:  severe uncontrolled pain     04/21/18 1506    Call MD for:  redness, tenderness, or signs of infection (pain, swelling, redness, odor or green/yellow discharge around incision site)     04/21/18 1506    Call MD for:  persistant dizziness or light-headedness     04/21/18 1506    Call MD for:  difficulty breathing, headache or visual disturbances     04/21/18 1506    Ambulatory referral to Hematology / Oncology    Comments:  Diffuse Lymphadenopathy, S/P Thoracentesis with Cytology Pending, Left axillary Lymph Node Biopsied, Concern for Lymphoma   04/21/18 [address] PA-C 05/09/18 0055    Charlesetta Shanks, MD 05/14/18 [phone]

## 2018-05-08 NOTE — Progress Notes (Signed)
Chemotherapy dosage and calculations checked and reviewed with Regina Baldwin, RN. 

## 2018-05-08 NOTE — Progress Notes (Signed)
Patient tolerated chemotherapy well.  

## 2018-05-08 NOTE — Progress Notes (Signed)
PT Cancellation Note  Patient Details Name: Deborah Cooley MRN: [id number] DOB: 1956/03/13   Cancelled Treatment:     HgB 7.2 pt scheduled to receive 2 units today.  Will attempt to see next day as schedule permits.  Pt has been evaluated with rec for [facility] PT.   Rica Koyanagi  PTA [facility]  Acute  Rehab Pager      [phone]

## 2018-05-08 NOTE — Progress Notes (Addendum)
PROGRESS NOTE    Asaiah Hunnicutt  XKG:[id number] DOB: Feb 09, 1956 DOA: 04/23/2018 PCP: Patient, No Pcp Per  Brief Narrative:62 yo with PMH of HTN, endometriosis s/p hysterectomy, tobacco use and recent hospitalization for multifocal lymphadenopathy and concern for lymphoma who presents for evaluation of recurrent shortness of breath and abdominal pain. History taken directly from the patient. Patient states that since discharge from hospital 3 days ago she has felt ongoing shortness of breath and swelling/pain in abdomen. Patient states both of these symptoms were present during previous hospitalization. She had some mild relief of symptoms with procedure done to drain fluid from her lungs while in hospital, but symptoms quickly returned once she got home. Since the day of discharge patient has experienced difficulty ambulating 2/2 shortness of breath and difficulty sleeping. Patient notes symptoms worse when laying flat and it feels as if she will drown/choke on her fluid at night. Because of this patient hasn't really slept since leaving the hospital. Patient thinks that her abdominal swelling noted last admission has gotten worse and she now has pain, described as increased fullness/pressure, in her upper abdomen whenever she takes in a deep breath. No fevers, difficulties with urination, bowel movements, headaches, or nausea/vomiting.   Upon arrival to the ED patient was afebrile, intermittently tachycardic, normotensive, and saturating >94% on room air. Patient's CBC with Hgb 10.9, WBC 7.7, platelets 178. BMP within normal limits. BNP = 78. ISTAT lactic acid elevated to 2.43, repeat 1.69. EKG with sinus tachycardia, but no acute changes concerning for ACS. ISTAT troponin within normal limits. Patient had CXR which showed increased bilateral pleural effusions, diffuse interstitial prominence, patchy airspace opacity in RML, and LLL atelectasis. Patient had ongoing SOB in ED and IMTS called for  admission.    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  Patient is awaiting chemotherapy.  Port was placed by IR 05/07/2018.  He is status post bone marrow biopsy 05/06/2018.  She started on Decadron 20 twice daily, allopurinol and IV fluids for prophylaxis of tumor lysis syndrome.  Uric acid level was 16.7 which is come down to 8 today.  2] small bilateral pleural effusion status post thoracentesis patient currently wheezing and placed on 2 L of oxygen.  Will obtain a chest x-ray to evaluate effusion and started on breathing treatments.  Lasix one-time order will be given.  Pleural effusions are increasing will consider thoracentesis.  3] AKI/hyponatremia resolved.  4] dependent edema continue TED hose, elevate  lower extremities as much as possible.  Echo showed ejection fraction 60 to 70% normal diastolic function  5] subclinical hypothyroidism TSH 9.3 T4 0.90 9 repeat these labs in 4 to 6 weeks and decide on treatment will hold off on any treatments at this time.  6] anemia thrombocytopenia still elevated blood transfusion patient has not had blood transfusion due to antibodies present in the blood.   7] hyperglycemia secondary to steroids continue SSI.  Not on Lovenox due to thrombocytopenia and anemia.  DVT prophylaxis: Continue TED hose. Code Status: Full code Family Communication: No family available Disposition Plan: TBD Consultants:  Cardiology, oncology Procedures bone marrow biopsy 05/06/2018, Port placement 05/07/2018. Antimicrobials: None  Subjective: Complaining  of lower extremity  edema awaiting blood transfusion denies shortness of breath on 2 L of oxygen   Objective: Vitals:   05/07/18 1700 05/07/18 1828 05/07/18 2120 05/08/18 0551  BP: 129/68 133/61 (!) 121/52 (!) 118/47  Pulse: 98 (!) 106 100 100  Resp: '13 16 16 17  ' Temp:  98.1 F (36.7 C) 98 F (36.7 C) 97.8 F (36.6 C)  TempSrc:  Oral Oral Oral  SpO2: 99% 99% 100% 100%  Weight:      Height:        Intake/Output Summary (Last 24 hours) at 05/08/2018 1008 Last data filed at 05/07/2018 1600 Gross per 24 hour  Intake 3737.5 ml  Output -  Net 3737.5 ml   Filed Weights   05/04/18 0203 05/05/18 0329 05/06/18 0601  Weight: 64.8 kg (142 lb 14.4 oz) 69.6 kg (153 lb 7 oz) 65.2 kg (143 lb 11.8 oz)    Examination:  General exam: Appears calm and comfortable  Respiratory system: WHEEZING B/L to auscultation. Respiratory effort normal. Cardiovascular system: S1 & S2 heard, RRR. No JVD, murmurs, rubs, gallops or clicks. No pedal edema. Gastrointestinal system: Abdomen is nondistended, soft and nontender. No organomegaly or masses felt. Normal bowel sounds heard. Central nervous system: Alert and oriented. No focal neurological deficits. Extremities:  2 PLUS EDEMA  Skin: No rashes, lesions or ulcers Psychiatry: Judgement and insight appear normal. Mood & affect appropriate.     Data Reviewed: I have personally reviewed following labs and imaging studies  CBC: Recent Labs  Lab 05/03/18 0533 05/04/18 0819 05/05/18 0536 05/06/18 0554 05/07/18 0408 05/08/18 0645  WBC 8.1 6.7 5.7 5.7 4.6 4.5  NEUTROABS 5.2 4.7  --   --  2.9  --   HGB 8.7* 8.4* 7.7* 7.7* 7.8* 7.2*  HCT 23.6* 24.4* 22.7* 22.7* 23.2* 21.7*  MCV 93.3 87.1 87.6 93.4 91.0 93.5  PLT 67* 68* 89* 118* 135* 448*   Basic Metabolic Panel: Recent Labs  Lab 05/04/18 0819 05/05/18 0536 05/06/18 0554 05/07/18 0408 05/08/18 0645  NA 124* 126* 129* 134* 136  K 4.4 4.4 4.7 5.0 4.3  CL 94* 98* 100* 105 106  CO2 19* 21* '22 26 27  ' GLUCOSE  173* 201* 189* 238* 133*  BUN 34* 30* 29* 31* 26*  CREATININE 1.12* 0.86 0.72 0.81 0.66  CALCIUM 8.2* 8.1* 8.4* 8.3* 8.2*  MG  --   --   --  2.3  --   PHOS  --   --   --  3.6  --    GFR: Estimated Creatinine Clearance: 64.6 mL/min (by C-G formula based on SCr of 0.66 mg/dL). Liver Function Tests: Recent Labs  Lab 05/02/18 0659 05/03/18 0533 05/07/18 0408  AST '31 27 24  ' ALT 12* 12* 19  ALKPHOS 52 55 52  BILITOT 1.1 1.1 0.6  PROT 7.7 7.8 8.1  ALBUMIN 2.6* 2.5* 2.8*   No results for input(s): LIPASE, AMYLASE in the last 168 hours. No results for input(s): AMMONIA in the last 168 hours. Coagulation Profile: Recent Labs  Lab 05/03/18 1407  INR 1.03   Cardiac Enzymes: No results for input(s): CKTOTAL, CKMB, CKMBINDEX, TROPONINI in the last 168 hours. BNP (last 3 results) No results for input(s): PROBNP in the last 8760 hours. HbA1C: No results for input(s): HGBA1C in the last 72 hours. CBG: Recent Labs  Lab 05/07/18 0728 05/07/18 1054 05/07/18 1730 05/07/18 2122 05/08/18 0727  GLUCAP 211* 175* 134* 140* 123*   Lipid Profile: No results for  input(s): CHOL, HDL, LDLCALC, TRIG, CHOLHDL, LDLDIRECT in the last 72 hours. Thyroid Function Tests: No results for input(s): TSH, T4TOTAL, FREET4, T3FREE, THYROIDAB in the last 72 hours. Anemia Panel: No results for input(s): VITAMINB12, FOLATE, FERRITIN, TIBC, IRON, RETICCTPCT in the last 72 hours. Sepsis Labs: No results for input(s): PROCALCITON, LATICACIDVEN in the last 168 hours.  No results found for this or any previous visit (from the past 240 hour(s)).       Radiology Studies: Ir Imaging Guided Port Insertion  Result Date: 05/07/2018 INDICATION: 62 year old female with a history of lymphoma. She has been referred for port catheter. EXAM: IMPLANTED PORT A CATH PLACEMENT WITH ULTRASOUND AND FLUOROSCOPIC GUIDANCE MEDICATIONS: 2 g Ancef; The antibiotic was administered within an appropriate time interval prior to skin  puncture. ANESTHESIA/SEDATION: Moderate (conscious) sedation was employed during this procedure. A total of Versed mg and Fentanyl 100 mcg was administered intravenously. Moderate Sedation Time: 20 minutes. The patient's level of consciousness and vital signs were monitored continuously by radiology nursing throughout the procedure under my direct supervision. FLUOROSCOPY TIME:  0 minutes, 6 seconds (1.5 mGy) COMPLICATIONS: None PROCEDURE: The procedure, risks, benefits, and alternatives were explained to the patient. Questions regarding the procedure were encouraged and answered. The patient understands and consents to the procedure. Ultrasound survey was performed with images stored and sent to PACs. The right neck and chest was prepped with chlorhexidine, and draped in the usual sterile fashion using maximum barrier technique (cap and mask, sterile gown, sterile gloves, large sterile sheet, hand hygiene and cutaneous antiseptic). Antibiotic prophylaxis was provided with 2.0g Ancef administered IV one hour prior to skin incision. Local anesthesia was attained by infiltration with 1% lidocaine without epinephrine. Ultrasound demonstrated patency of the right internal jugular vein, and this was documented with an image. Under real-time ultrasound guidance, this vein was accessed with a 21 gauge micropuncture needle and image documentation was performed. A small dermatotomy was made at the access site with an 11 scalpel. A 0.018" wire was advanced into the SVC and used to estimate the length of the internal catheter. The access needle exchanged for a 45F micropuncture vascular sheath. The 0.018" wire was then removed and a 0.035" wire advanced into the IVC. An appropriate location for the subcutaneous reservoir was selected below the clavicle and an incision was made through the skin and underlying soft tissues. The subcutaneous tissues were then dissected using a combination of blunt and sharp surgical technique and  a pocket was formed. A single lumen power injectable portacatheter was then tunneled through the subcutaneous tissues from the pocket to the dermatotomy and the port reservoir placed within the subcutaneous pocket. The venous access site was then serially dilated and a peel away vascular sheath placed over the wire. The wire was removed and the port catheter advanced into position under fluoroscopic guidance. The catheter tip is positioned in the cavoatrial junction. This was documented with a spot image. The portacatheter was then tested and found to flush and aspirate well. The port was flushed with saline and Huber needle was left in place at the completion. The pocket was then closed in two layers using first subdermal inverted interrupted absorbable sutures followed by a running subcuticular suture. The epidermis was then sealed with Dermabond. The dermatotomy at the venous access site was also seal with Dermabond and Steri-Strips. Patient tolerated the procedure well and remained hemodynamically stable throughout. No complications encountered and no significant blood loss encountered . IMPRESSION: Status post right IJ port  catheter placement. Catheter ready for use. Signed, Dulcy Fanny. Dellia Nims, RPVI [facility] Radiology Electronically Signed   By: Corrie Mckusick D.O.   On: 05/07/2018 17:19        Scheduled Meds: . sodium chloride   Intravenous Once  . sodium chloride   Intravenous Once  . allopurinol  300 mg Oral Daily  . feeding supplement  1 Container Oral TID BM  . furosemide  20 mg Intravenous Once  . insulin aspart  0-5 Units Subcutaneous QHS  . insulin aspart  0-9 Units Subcutaneous TID WC  . ipratropium-albuterol  3 mL Nebulization Q6H  . ramelteon  8 mg Oral QHS  . sodium chloride flush  3 mL Intravenous Q12H   Continuous Infusions: . sodium chloride 125 mL/hr at 05/06/18 1351     LOS: 13 days     Georgette Shell, MD If  7PM-7AM, please contact night-coverage [email or web address] Password [email or web address] 05/08/2018, 10:08 AM

## 2018-05-08 NOTE — Progress Notes (Addendum)
PT recommendations gone over with pt at bedside. This CM explained that because she does not have insurance to pay for HHPT, she would need to be evaluated for charity care by [facility]. [facility] rep alerted of need for eval for charity care. Will need MD order for HHPT. Marney Doctor RN,BSN,NCM [phone]

## 2018-05-09 ENCOUNTER — Inpatient Hospital Stay (HOSPITAL_COMMUNITY): Payer: Self-pay

## 2018-05-09 ENCOUNTER — Encounter (HOSPITAL_COMMUNITY): Payer: Self-pay

## 2018-05-09 DIAGNOSIS — B259 Cytomegaloviral disease, unspecified: Secondary | ICD-10-CM

## 2018-05-09 DIAGNOSIS — Z803 Family history of malignant neoplasm of breast: Secondary | ICD-10-CM

## 2018-05-09 LAB — COMPREHENSIVE METABOLIC PANEL
ALT: 17 U/L (ref 14–54)
AST: 22 U/L (ref 15–41)
Albumin: 2.7 g/dL — ABNORMAL LOW (ref 3.5–5.0)
Alkaline Phosphatase: 46 U/L (ref 38–126)
Anion gap: 3 — ABNORMAL LOW (ref 5–15)
BUN: 25 mg/dL — AB (ref 6–20)
CO2: 29 mmol/L (ref 22–32)
CREATININE: 0.68 mg/dL (ref 0.44–1.00)
Calcium: 8 mg/dL — ABNORMAL LOW (ref 8.9–10.3)
Chloride: 107 mmol/L (ref 101–111)
GFR calc Af Amer: >60 mL/min (ref >60)
Glucose, Bld: 142 mg/dL — ABNORMAL HIGH (ref 65–99)
Potassium: 4.4 mmol/L (ref 3.5–5.1)
Sodium: 139 mmol/L (ref 135–145)
TOTAL PROTEIN: 7.8 g/dL (ref 6.5–8.1)
Total Bilirubin: 0.4 mg/dL (ref 0.3–1.2)

## 2018-05-09 LAB — CBC WITH DIFFERENTIAL/PLATELET
BASOS ABS: 0 10*3/uL (ref 0.0–0.1)
Basophils Relative: 0 %
Eosinophils Absolute: 0 10*3/uL (ref 0.0–0.7)
Eosinophils Relative: 0 %
HEMATOCRIT: 29.1 % — AB (ref 36.0–46.0)
Hemoglobin: 9.6 g/dL — ABNORMAL LOW (ref 12.0–15.0)
LYMPHS PCT: 24 %
Lymphs Abs: 1 10*3/uL (ref 0.7–4.0)
MCH: 29.7 pg (ref 26.0–34.0)
MCHC: 33 g/dL (ref 30.0–36.0)
MCV: 90.1 fL (ref 78.0–100.0)
Monocytes Absolute: 0.3 10*3/uL (ref 0.1–1.0)
Monocytes Relative: 6 %
NEUTROS ABS: 3 10*3/uL (ref 1.7–7.7)
Neutrophils Relative %: 70 %
Platelets: 112 10*3/uL — ABNORMAL LOW (ref 150–400)
RBC: 3.23 MIL/uL — ABNORMAL LOW (ref 3.87–5.11)
RDW: 16.5 % — ABNORMAL HIGH (ref 11.5–15.5)
WBC: 4.2 10*3/uL (ref 4.0–10.5)

## 2018-05-09 LAB — TYPE AND SCREEN
ABO/RH(D): O POS
ANTIBODY SCREEN: POSITIVE
DAT, IgG: POSITIVE
PT AG Type: POSITIVE
UNIT DIVISION: 0
Unit division: 0

## 2018-05-09 LAB — BPAM RBC
BLOOD PRODUCT EXPIRATION DATE: 201907282359
BLOOD PRODUCT EXPIRATION DATE: 201907282359
ISSUE DATE / TIME: 201906202312
ISSUE DATE / TIME: 201906201330
UNIT TYPE AND RH: 5100
Unit Type and Rh: 5100

## 2018-05-09 LAB — GLUCOSE, CAPILLARY
GLUCOSE-CAPILLARY: 221 mg/dL — AB (ref 65–99)
Glucose-Capillary: 229 mg/dL — ABNORMAL HIGH (ref 65–99)

## 2018-05-09 LAB — RASBURICASE - URIC ACID: Uric Acid, Serum: 4.6 mg/dL (ref 2.3–6.6)

## 2018-05-09 LAB — LACTATE DEHYDROGENASE: LDH: 181 U/L (ref 98–192)

## 2018-05-09 MED ORDER — IPRATROPIUM-ALBUTEROL 0.5-2.5 (3) MG/3ML IN SOLN
3.0000 mL | Freq: Four times a day (QID) | RESPIRATORY_TRACT | Status: DC
  Administered 2018-05-09: 3 mL via RESPIRATORY_TRACT
  Filled 2018-05-09: qty 3

## 2018-05-09 MED ORDER — IPRATROPIUM-ALBUTEROL 0.5-2.5 (3) MG/3ML IN SOLN: 3.0000 mL | RESPIRATORY_TRACT | Status: DC | PRN

## 2018-05-09 MED ORDER — IPRATROPIUM-ALBUTEROL 0.5-2.5 (3) MG/3ML IN SOLN
3.0000 mL | Freq: Two times a day (BID) | RESPIRATORY_TRACT | Status: DC
  Administered 2018-05-10 – 2018-05-11 (×3): 3 mL via RESPIRATORY_TRACT
  Filled 2018-05-09 (×3): qty 3

## 2018-05-09 NOTE — Progress Notes (Addendum)
Physical Therapy Treatment Patient Details Name: Deborah Cooley MRN: [id number] DOB: 10-09-1956 Today's Date: 05/09/2018    History of Present Illness Pt is a 62 y.o. F with significant PMH of HTN, endometriosis s/p hysterectomy, tobaccos use and recent hospitalization for multifocal lymphadenopathy and concern for lymphoma who presents for eavluation of recurrent shortness of breath and abdominal pain.    PT Comments    Pt is progressing well with mobility, she ambulated 200' with hand held assist. SaO2 90% on room air, HR 110. Distance limited by fatigue.   Follow Up Recommendations  Home health PT;Supervision for mobility/OOB     Equipment Recommendations  None recommended by PT    Recommendations for Other Services       Precautions / Restrictions Precautions Precautions: Fall Restrictions Weight Bearing Restrictions: No    Mobility  Bed Mobility               General bed mobility comments: pt up in chair when PT arrived  Transfers Overall transfer level: Needs assistance Equipment used: 1 person hand held assist;None Transfers: Sit to/from Stand Sit to Stand: Supervision         General transfer comment: VCs for hand placement  Ambulation/Gait Ambulation/Gait assistance: Supervision Gait Distance (Feet): 200 Feet Assistive device: 1 person hand held assist;None Gait Pattern/deviations: Step-through pattern;Decreased stride length;Drifts right/left;Wide base of support Gait velocity: reduced   General Gait Details: mild LOB x 1, pt able to self correct, HR 110 walking, SaO2 90% on room air walking, 2/4 dyspnea, ambulated 150' without AD then was fatiguing so used HHA for last 50'   Stairs             Wheelchair Mobility    Modified Rankin (Stroke Patients Only)       Balance     Sitting balance-Leahy Scale: Good       Standing balance-Leahy Scale: Fair Standing balance comment: required HHA today, may need an AD                             Cognition Arousal/Alertness: Awake/alert Behavior During Therapy: WFL for tasks assessed/performed Overall Cognitive Status: Within Functional Limits for tasks assessed                                        Exercises  ankle pumps x 10 B AROM seated Knee ext AROM B x 10 seated    General Comments        Pertinent Vitals/Pain Pain Assessment: No/denies pain    Home Living                      Prior Function            PT Goals (current goals can now be found in the care plan section) Acute Rehab PT Goals Patient Stated Goal: play with grandkids PT Goal Formulation: With patient Time For Goal Achievement: 05/03/18 Potential to Achieve Goals: Good Progress towards PT goals: Progressing toward goals    Frequency    Min 3X/week      PT Plan Current plan remains appropriate    Co-evaluation              AM-PAC PT "6 Clicks" Daily Activity  Outcome Measure  Difficulty turning over in bed (including adjusting bedclothes, sheets and blankets)?: None Difficulty moving  from lying on back to sitting on the side of the bed? : A Little Difficulty sitting down on and standing up from a chair with arms (e.g., wheelchair, bedside commode, etc,.)?: A Little Help needed moving to and from a bed to chair (including a wheelchair)?: A Little Help needed walking in hospital room?: A Little Help needed climbing 3-5 steps with a railing? : A Lot 6 Click Score: 18    End of Session Equipment Utilized During Treatment: Gait belt Activity Tolerance: Patient tolerated treatment well Patient left: in chair;with call bell/phone within reach Nurse Communication: Mobility status PT Visit Diagnosis: Unsteadiness on feet (R26.81);Difficulty in walking, not elsewhere classified (R26.2)     Time: 1950-9326 PT Time Calculation (min) (ACUTE ONLY): 20 min  Charges:  $Gait Training: 8-22 mins                    G Codes:           Philomena Doheny 05/09/2018, 11:24 AM [phone]

## 2018-05-09 NOTE — Progress Notes (Signed)
PROGRESS NOTE    Deborah Cooley  ZHY:[id number] DOB: 1956/01/14 DOA: 04/23/2018 PCP: Patient, No Pcp Per  Brief Narrative: 62 yo with PMH of HTN, endometriosis s/p hysterectomy, tobacco use and recent hospitalization for multifocal lymphadenopathy and concern for lymphoma who presents for evaluation of recurrent shortness of breath and abdominal pain. History taken directly from the patient. Patient states that since discharge from hospital 3 days ago she has felt ongoing shortness of breath and swelling/pain in abdomen. Patient states both of these symptoms were present during previous hospitalization. She had some mild relief of symptoms with procedure done to drain fluid from her lungs while in hospital, but symptoms quickly returned once she got home. Since the day of discharge patient has experienced difficulty ambulating 2/2 shortness of breath and difficulty sleeping. Patient notes symptoms worse when laying flat and it feels as if she will drown/choke on her fluid at night. Because of this patient hasn't really slept since leaving the hospital. Patient thinks that her abdominal swelling noted last admission has gotten worse and she now has pain, described as increased fullness/pressure, in her upper abdomen whenever she takes in a deep breath. No fevers, difficulties with urination, bowel movements, headaches, or nausea/vomiting.   Upon arrival to the ED patient was afebrile, intermittently tachycardic, normotensive, and saturating >94% on room air. Patient's CBC with Hgb 10.9, WBC 7.7, platelets 178. BMP within normal limits. BNP = 78. ISTAT lactic acid elevated to 2.43, repeat 1.69. EKG with sinus tachycardia, but no acute changes concerning for ACS. ISTAT troponin within normal limits. Patient had CXR which showed increased bilateral pleural effusions, diffuse interstitial prominence, patchy airspace opacity in RML, and LLL atelectasis. Patient had ongoing SOB in ED and IMTS called for  admission.   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  Patient is awaiting chemotherapy.  Port was placed by IR 05/07/2018.  He is status post bone marrow biopsy 05/06/2018. RECEIVED FIRST CHEMO 6/20. She started on Decadron 20 twice daily, allopurinol and IV fluids for prophylaxis of tumor lysis syndrome.  Uric acid level was 16.7 which is come down to 8 today.INCREASE ACTIVITY,AMBULATE.  2] small bilateral pleural effusion status post thoracentesis patient currently wheezing and placed on 2 L of oxygen.  Chest x-ray 6/20 currently shows increasing volume of right pleural effusion with increasing right basilar atelectasis, small left pleural effusion.patient asymptomatic at this time saturation on room air above 92%.  Will obtain a two-view chest x-ray.    3] AKI/hyponatremia resolved.  4] dependent edema continue TED hose, elevate  lower extremities as much as possible.  Echo showed ejection fraction 60 to 29% normal diastolic function  ] subclinical hypothyroidism TSH 9.3 T4 0.90 9 repeat these labs in 4 to 6 weeks and decide on treatment will hold off on any treatments at this time.  6] anemia thrombocytopenia S/P blood transfusion.    7] hyperglycemia secondary to steroids continue SSI.  Not on Lovenox due to thrombocytopenia and anemia       DVT prophylaxis: ted Code Status:full Family Communication:none Disposition Plan: Per onc Consultants: onc ir  Procedures:BM biopsy 6/18  and port 6/19 Antimicrobials:  none Subjective: Feels  better...  Objective: Vitals:   05/08/18 2346 05/09/18 0100 05/09/18 0120 05/09/18 0544  BP: (!) 101/52  (!) 106/58 (!) 122/52  Pulse: 65  87 95  Resp: _0 Temp: 98.7 F (37.1 C)  97.7 F (36.5 C) 97.9 F (36.6 C)  TempSrc: Axillary  Axillary Oral  SpO2: 99%   100%  Weight:  66.1 kg (145 lb 11.6 oz)    Height:        Intake/Output Summary (Last 24 hours) at 05/09/2018 1325 Last data filed at 05/09/2018 1024 Gross per 24 hour  Intake 1136 ml  Output -  Net 1136 ml   Filed Weights   05/05/18 0329 05/06/18 0601 05/09/18 0100  Weight: 69.6 kg (153 lb 7 oz) 65.2 kg (143 lb 11.8 oz) 66.1 kg (145 lb 11.6 oz)    Examination:  General exam: Appears calm and comfortable  Respiratory system: Clear to auscultation. Respiratory effort normal. Cardiovascular system: S1 & S2 heard, RRR. No JVD, murmurs, rubs, gallops or clicks. No pedal edema. Gastrointestinal system: Abdomen is nondistended, soft and nontender. No organomegaly or masses felt. Normal bowel sounds heard. Central nervous system: Alert and oriented. No focal neurological deficits. Extremities: Symmetric 5 x 5 power. Skin: No rashes, lesions or ulcers Psychiatry: Judgement and insight appear normal. Mood & affect appropriate.     Data Reviewed: I have personally reviewed following labs and imaging studies  CBC: Recent Labs  Lab 05/03/18 0533 05/04/18 0819 05/05/18 0536 05/06/18 0554 05/07/18 0408 05/08/18 0645 05/09/18 0650  WBC 8.1 6.7 5.7 5.7 4.6 4.5 4.2  NEUTROABS 5.2 4.7  --   --  2.9  --  3.0  HGB 8.7* 8.4* 7.7* 7.7* 7.8* 7.2* 9.6*  HCT 23.6* 24.4* 22.7* 22.7* 23.2* 21.7* 29.1*  MCV 93.3 87.1 87.6 93.4 91.0 93.5 90.1  PLT 67* 68* 89* 118* 135* 124* 701*   Basic Metabolic Panel: Recent Labs  Lab 05/05/18 0536 05/06/18 0554 05/07/18 0408 05/08/18 0645 05/09/18 0650  NA 126* 129* 134* 136 139  K 4.4 4.7 5.0 4.3 4.4  CL 98* 100* 105 106 107  CO2 21* _1 GLUCOSE 201* 189* 238* 133*  142*  BUN 30* 29* 31* 26* 25*  CREATININE 0.86 0.72 0.81 0.66 0.68  CALCIUM 8.1* 8.4* 8.3* 8.2* 8.0*  MG  --   --  2.3  --   --   PHOS  --   --  3.6  --   --    GFR: Estimated Creatinine Clearance: 65 mL/min (by C-G formula based on SCr of 0.68 mg/dL). Liver Function Tests: Recent Labs  Lab 05/03/18 0533 05/07/18 0408 05/09/18 0650  AST _2 ALT 12* 19 17  ALKPHOS 55 52 46  BILITOT 1.1 0.6 0.4  PROT 7.8 8.1 7.8  ALBUMIN 2.5* 2.8* 2.7*   No results for input(s): LIPASE, AMYLASE in the last 168 hours. No results for input(s): AMMONIA in the last 168 hours. Coagulation Profile: Recent Labs  Lab 05/03/18 1407  INR 1.03   Cardiac Enzymes: No results for input(s): CKTOTAL, CKMB, CKMBINDEX, TROPONINI in the last 168 hours. BNP (last 3 results) No results for input(s): PROBNP in the last 8760 hours. HbA1C: No results for input(s): HGBA1C in the last 72 hours. CBG: Recent Labs  Lab 05/07/18 2122 05/08/18 0727 05/08/18 1141 05/08/18 1700 05/08/18 2150  GLUCAP 140* 123* 137* 182* 201*   Lipid  Profile: No results for input(s): CHOL, HDL, LDLCALC, TRIG, CHOLHDL, LDLDIRECT in the last 72 hours. Thyroid Function Tests: No results for input(s): TSH, T4TOTAL, FREET4, T3FREE, THYROIDAB in the last 72 hours. Anemia Panel: No results for input(s): VITAMINB12, FOLATE, FERRITIN, TIBC, IRON, RETICCTPCT in the last 72 hours. Sepsis Labs: No results for input(s): PROCALCITON, LATICACIDVEN in the last 168 hours.  No results found for this or any previous visit (from the past 240 hour(s)).       Radiology Studies: Dg Chest 1 View  Result Date: 05/08/2018 CLINICAL DATA:  Wheezing EXAM: CHEST  1 VIEW COMPARISON:  Chest x-ray of 04/23/2017 FINDINGS: There has been increase in volume of right pleural effusion with increasing right basilar atelectasis. A smaller left pleural effusion remains with probable partial atelectasis at the left lung base as well. The heart is mildly  enlarged. A Port-A-Cath is now present with the tip overlying the mid lower SVC. No pneumothorax is seen. No bony abnormality is noted. IMPRESSION: 1. Increase in volume of right pleural effusion with increasing right basilar atelectasis. 2. Small left pleural effusion remains with probable partial atelectasis of the left lung base as well. 3. Port-A-Cath tip overlies the mid lower SVC. Electronically Signed   By: Ivar Drape M.D.   On: 05/08/2018 11:51   Ir Imaging Guided Port Insertion  Result Date: 05/07/2018 INDICATION: 62 year old female with a history of lymphoma. She has been referred for port catheter. EXAM: IMPLANTED PORT A CATH PLACEMENT WITH ULTRASOUND AND FLUOROSCOPIC GUIDANCE MEDICATIONS: 2 g Ancef; The antibiotic was administered within an appropriate time interval prior to skin puncture. ANESTHESIA/SEDATION: Moderate (conscious) sedation was employed during this procedure. A total of Versed mg and Fentanyl 100 mcg was administered intravenously. Moderate Sedation Time: 20 minutes. The patient's level of consciousness and vital signs were monitored continuously by radiology nursing throughout the procedure under my direct supervision. FLUOROSCOPY TIME:  0 minutes, 6 seconds (1.5 mGy) COMPLICATIONS: None PROCEDURE: The procedure, risks, benefits, and alternatives were explained to the patient. Questions regarding the procedure were encouraged and answered. The patient understands and consents to the procedure. Ultrasound survey was performed with images stored and sent to PACs. The right neck and chest was prepped with chlorhexidine, and draped in the usual sterile fashion using maximum barrier technique (cap and mask, sterile gown, sterile gloves, large sterile sheet, hand hygiene and cutaneous antiseptic). Antibiotic prophylaxis was provided with 2.0g Ancef administered IV one hour prior to skin incision. Local anesthesia was attained by infiltration with 1% lidocaine without epinephrine.  Ultrasound demonstrated patency of the right internal jugular vein, and this was documented with an image. Under real-time ultrasound guidance, this vein was accessed with a 21 gauge micropuncture needle and image documentation was performed. A small dermatotomy was made at the access site with an 11 scalpel. A 0.018" wire was advanced into the SVC and used to estimate the length of the internal catheter. The access needle exchanged for a 39F micropuncture vascular sheath. The 0.018" wire was then removed and a 0.035" wire advanced into the IVC. An appropriate location for the subcutaneous reservoir was selected below the clavicle and an incision was made through the skin and underlying soft tissues. The subcutaneous tissues were then dissected using a combination of blunt and sharp surgical technique and a pocket was formed. A single lumen power injectable portacatheter was then tunneled through the subcutaneous tissues from the pocket to the dermatotomy and the port reservoir placed within the subcutaneous pocket. The venous  access site was then serially dilated and a peel away vascular sheath placed over the wire. The wire was removed and the port catheter advanced into position under fluoroscopic guidance. The catheter tip is positioned in the cavoatrial junction. This was documented with a spot image. The portacatheter was then tested and found to flush and aspirate well. The port was flushed with saline and Huber needle was left in place at the completion. The pocket was then closed in two layers using first subdermal inverted interrupted absorbable sutures followed by a running subcuticular suture. The epidermis was then sealed with Dermabond. The dermatotomy at the venous access site was also seal with Dermabond and Steri-Strips. Patient tolerated the procedure well and remained hemodynamically stable throughout. No complications encountered and no significant blood loss encountered . IMPRESSION: Status post  right IJ port catheter placement. Catheter ready for use. Signed, Dulcy Fanny. Dellia Nims, RPVI [facility] Radiology Electronically Signed   By: Corrie Mckusick D.O.   On: 05/07/2018 17:19        Scheduled Meds: . sodium chloride   Intravenous Once  . sodium chloride   Intravenous Once  . allopurinol  300 mg Oral Daily  . feeding supplement  1 Container Oral TID BM  . insulin aspart  0-5 Units Subcutaneous QHS  . insulin aspart  0-9 Units Subcutaneous TID WC  . predniSONE  60 mg Oral Q breakfast  . ramelteon  8 mg Oral QHS  . sodium chloride flush  3 mL Intravenous Q12H   Continuous Infusions: . sodium chloride 50 mL/hr at 05/08/18 2240  . sodium chloride       LOS: 14 days     Georgette Shell, MD [facility] If 7PM-7AM, please contact night-coverage [email or web address] Password [email or web address] 05/09/2018, 1:25 PM

## 2018-05-09 NOTE — Progress Notes (Addendum)
HEMATOLOGY/ONCOLOGY INPATIENT PROGRESS NOTE  Date of Service: 05/09/2018  Inpatient Attending: .Georgette Shell, MD   SUBJECTIVE:    [name]  is accompanied today by her husband and daughter. The pt reports that she is doing well overall.   The pt reports that she is trying to eat better and understands that this is important. She notes that her first infusion of chemotherapy has gone well and she has not experienced any side effects including nausea, vomiting, and diarrhea. She adds that she is feeling much better today after receiving her blood transfusion.  Lab results today (05/09/18) of CBC, CMP is as follows: all values are WNL except for RBC at 3.23, HGB at 9.6, HCT at 29.1, RDW at 16.5, PLT at 112, Glucose at 142, BUN at 25, Calcium at 8.0, Albumin at 2.7. LDH 05/09/18 is WNL at 181  On review of systems, pt reports better energy levels and denies nausea, vomiting, diarrhea, abdominal pains, and any other symptoms.    OBJECTIVE:  NAD  PHYSICAL EXAMINATION: . Vitals:   05/08/18 2346 05/09/18 0100 05/09/18 0120 05/09/18 0544  BP: (!) 101/52  (!) 106/58 (!) 122/52  Pulse: 65  87 95  Resp: '16  16 16  ' Temp: 98.7 F (37.1 C)  97.7 F (36.5 C) 97.9 F (36.6 C)  TempSrc: Axillary  Axillary Oral  SpO2: 99%   100%  Weight:  145 lb 11.6 oz (66.1 kg)    Height:       Filed Weights   05/05/18 0329 05/06/18 0601 05/09/18 0100  Weight: 153 lb 7 oz (69.6 kg) 143 lb 11.8 oz (65.2 kg) 145 lb 11.6 oz (66.1 kg)   .Body mass index is 26.65 kg/m.  GENERAL:alert, in no acute distress and comfortable SKIN: skin color, texture, turgor are normal, no rashes or significant lesions EYES: normal, conjunctiva are pink and non-injected, sclera clear OROPHARYNX:no exudate, no erythema and lips, buccal mucosa, and tongue normal  NECK: supple, no JVD, thyroid normal size, non-tender, without nodularity LYMPH:  no palpable lymphadenopathy in the cervical, axillary or  inguinal LUNGS: clear to auscultation with normal respiratory effort HEART: regular rate & rhythm,  no murmurs and no lower extremity edema ABDOMEN: abdomen soft, non-tender, normoactive bowel sounds  Musculoskeletal: no cyanosis of digits and no clubbing  PSYCH: alert & oriented x 3 with fluent speech NEURO: no focal motor/sensory deficits  MEDICAL HISTORY:  Past Medical History:  Diagnosis Date  . Bilateral pleural effusion 04/17/2018  . Endometriosis   . Essential hypertension, benign   . Family history of adverse reaction to anesthesia    "sister stops breathing I think" (04/18/2018)  . Heart murmur   . Mixed hyperlipidemia   . Pneumonia 04/17/2018    SURGICAL HISTORY: Past Surgical History:  Procedure Laterality Date  . ABDOMINAL HYSTERECTOMY  1970s/1980s   for endometriosis  . Arthroscopic right shoulder surgery    . AXILLARY LYMPH NODE BIOPSY Left 04/21/2018   Procedure: LEFT AXILLARY LYMPH NODE BIOPSY;  Surgeon: Kieth Brightly, Arta Bruce, MD;  Location: [facility];  Service: General;  Laterality: Left;  . CARPAL TUNNEL RELEASE Right   . GANGLION CYST EXCISION Right   . IR IMAGING GUIDED PORT INSERTION  05/07/2018  . MULTIPLE TOOTH EXTRACTIONS    . OOPHORECTOMY  1997  . PARTIAL HYSTERECTOMY  1984  . Right hand surgery    . SHOULDER OPEN ROTATOR CUFF REPAIR Right   . VESICOVAGINAL FISTULA CLOSURE W/ TAH  SOCIAL HISTORY: Social History   Socioeconomic History  . Marital status: Married    Spouse name: Not on file  . Number of children: Not on file  . Years of education: Not on file  . Highest education level: Not on file  Occupational History  . Occupation: Social research officer, government    Comment: works at [employer]  . Financial resource strain: Not on file  . Food insecurity:    Worry: Not on file    Inability: Not on file  . Transportation needs:    Medical: Not on file    Non-medical: Not on file  Tobacco Use  . Smoking status: Current Every Day  Smoker    Packs/day: 1.50    Years: 46.00    Pack years: 69.00    Types: Cigarettes  . Smokeless tobacco: Never Used  Substance and Sexual Activity  . Alcohol use: Not Currently    Comment: 04/18/2018 "quit years ago"  . Drug use: Not Currently    Comment: "back in my 5s"  . Sexual activity: Not Currently  Lifestyle  . Physical activity:    Days per week: Not on file    Minutes per session: Not on file  . Stress: Not on file  Relationships  . Social connections:    Talks on phone: Not on file    Gets together: Not on file    Attends religious service: Not on file    Active member of club or organization: Not on file    Attends meetings of clubs or organizations: Not on file    Relationship status: Not on file  . Intimate partner violence:    Fear of current or ex partner: Not on file    Emotionally abused: Not on file    Physically abused: Not on file    Forced sexual activity: Not on file  Other Topics Concern  . Not on file  Social History Narrative   ** Merged History Encounter **       Patient has one son whom is healthy.   Has a live in boyfriend.    FAMILY HISTORY: Family History  Problem Relation Age of Onset  . Diabetes Mother        age 48  . Heart attack Father        died age 64's  . Hypertension Father   . Cancer Sister        breast cancer diagonsed age 80 years  . Diabetes Unknown   . Heart disease Unknown        Female <55  . Arthritis Unknown   . Asthma Unknown   . Hyperlipidemia Other        several siblings  . Thyroid disease Sister        one sister with thyroid disease    ALLERGIES:  has No Known Allergies.  MEDICATIONS:  Scheduled Meds: . sodium chloride   Intravenous Once  . sodium chloride   Intravenous Once  . allopurinol  300 mg Oral Daily  . feeding supplement  1 Container Oral TID BM  . insulin aspart  0-5 Units Subcutaneous QHS  . insulin aspart  0-9 Units Subcutaneous TID WC  . ipratropium-albuterol  3 mL Nebulization  Q6H  . predniSONE  60 mg Oral Q breakfast  . ramelteon  8 mg Oral QHS  . sodium chloride flush  3 mL Intravenous Q12H   Continuous Infusions: . sodium chloride 50 mL/hr at 05/08/18 2240  .  sodium chloride     PRN Meds:.acetaminophen **OR** acetaminophen, alteplase, diclofenac sodium, diphenhydrAMINE, guaiFENesin-dextromethorphan, heparin lock flush, HYDROmorphone (DILAUDID) injection, polyethylene glycol, prochlorperazine, sodium chloride flush, sodium chloride flush  REVIEW OF SYSTEMS:    10 Point review of Systems was done is negative except as noted above.   LABORATORY DATA:  I have reviewed the data as listed  . CBC Latest Ref Rng & Units 05/09/2018 05/08/2018 05/07/2018  WBC 4.0 - 10.5 K/uL 4.2 4.5 4.6  Hemoglobin 12.0 - 15.0 g/dL 9.6(L) 7.2(L) 7.8(L)  Hematocrit 36.0 - 46.0 % 29.1(L) 21.7(L) 23.2(L)  Platelets 150 - 400 K/uL 112(L) 124(L) 135(L)    . CMP Latest Ref Rng & Units 05/09/2018 05/08/2018 05/07/2018  Glucose 65 - 99 mg/dL 142(H) 133(H) 238(H)  BUN 6 - 20 mg/dL 25(H) 26(H) 31(H)  Creatinine 0.44 - 1.00 mg/dL 0.68 0.66 0.81  Sodium 135 - 145 mmol/L 139 136 134(L)  Potassium 3.5 - 5.1 mmol/L 4.4 4.3 5.0  Chloride 101 - 111 mmol/L 107 106 105  CO2 22 - 32 mmol/L '29 27 26  ' Calcium 8.9 - 10.3 mg/dL 8.0(L) 8.2(L) 8.3(L)  Total Protein 6.5 - 8.1 g/dL 7.8 - 8.1  Total Bilirubin 0.3 - 1.2 mg/dL 0.4 - 0.6  Alkaline Phos 38 - 126 U/L 46 - 52  AST 15 - 41 U/L 22 - 24  ALT 14 - 54 U/L 17 - 19     RADIOGRAPHIC STUDIES: I have personally reviewed the radiological images as listed and agreed with the findings in the report. Dg Chest 1 View  Result Date: 05/08/2018 CLINICAL DATA:  Wheezing EXAM: CHEST  1 VIEW COMPARISON:  Chest x-ray of 04/23/2017 FINDINGS: There has been increase in volume of right pleural effusion with increasing right basilar atelectasis. A smaller left pleural effusion remains with probable partial atelectasis at the left lung base as well. The heart is  mildly enlarged. A Port-A-Cath is now present with the tip overlying the mid lower SVC. No pneumothorax is seen. No bony abnormality is noted. IMPRESSION: 1. Increase in volume of right pleural effusion with increasing right basilar atelectasis. 2. Small left pleural effusion remains with probable partial atelectasis of the left lung base as well. 3. Port-A-Cath tip overlies the mid lower SVC. Electronically Signed   By: Ivar Drape M.D.   On: 05/08/2018 11:51   Dg Chest 2 View  Result Date: 04/23/2018 CLINICAL DATA:  Recent thoracentesis, worsening shortness of breath would like swelling. Productive cough. History of pleural effusion, pneumonia, left axillary node biopsy April 21, 2018. EXAM: CHEST - 2 VIEW COMPARISON:  Chest radiograph April 19, 2018 and CT chest Apr 18, 2018 FINDINGS: Increasing small bilateral pleural effusions. Diffuse interstitial prominence. Increased lung volumes and flattened hemidiaphragms. Worsening patchy right middle lobe airspace opacity. Left lung base atelectasis. Cardiac silhouette mildly enlarged. Calcified aortic arch. No pneumothorax. Calcified right thyroid nodule. Soft tissue planes and included osseous structures are not suspicious. IMPRESSION: Increasing small bilateral pleural effusions. Worsening right middle lobe airspace opacity with left lung base atelectasis. Mild cardiomegaly.  COPD. Aortic Atherosclerosis (ICD10-I70.0). Electronically Signed   By: Elon Alas M.D.   On: 04/23/2018 21:15   Dg Chest 2 View  Result Date: 04/19/2018 CLINICAL DATA:  Productive cough and shortness of breath for 1 week. EXAM: CHEST - 2 VIEW COMPARISON:  PA and lateral chest 04/17/2018. CT chest, abdomen and pelvis 04/18/2018. FINDINGS: Small bilateral pleural effusions are seen, greater on the right. Basilar atelectasis is noted. No consolidative  process or pneumothorax. Heart size is normal. Aortic atherosclerosis is noted. Calcified lesion in the right lobe of the thyroid is noted.  No acute bony abnormality. IMPRESSION: Small bilateral pleural effusions and mild basilar atelectasis. Atherosclerosis. Electronically Signed   By: Inge Rise M.D.   On: 04/19/2018 13:55   Dg Chest 2 View  Result Date: 04/17/2018 CLINICAL DATA:  Abdominal pain.  Shortness of breath. EXAM: CHEST - 2 VIEW COMPARISON:  None. FINDINGS: The heart size is normal. The hila and mediastinum are unremarkable. A 2.2 cm eggshell calcification is seen in the right thoracic inlet. No pneumothorax. Mild interstitial prominence bilaterally. Small bilateral pleural effusions. No nodules or masses. No other acute abnormalities. IMPRESSION: 1. Mild interstitial opacities in the lungs are nonspecific. Edema could have this appearance but the lack of cardiomegaly would be unusual. Atypical infection is possible. 2. Small bilateral effusions. 3. The eggshell calcification in the right thoracic inlet is nonspecific. This could be a vascular structure or a calcified nodule in the thyroid. Electronically Signed   By: Dorise Bullion III M.D   On: 04/17/2018 23:32   Ct Angio Chest Pe W/cm &/or Wo Cm  Addendum Date: 04/18/2018   ADDENDUM REPORT: 04/18/2018 08:59 ADDENDUM: Add to IMPRESSION: There is wall thickening in the urinary bladder with mild soft tissue stranding adjacent to the bladder. Suspect a degree of cystitis. Electronically Signed   By: Lowella Grip III M.D.   On: 04/18/2018 08:59   Result Date: 04/18/2018 CLINICAL DATA:  Shortness of breath.  Abdominal pain EXAM: CT ANGIOGRAPHY CHEST CT ABDOMEN AND PELVIS WITH CONTRAST TECHNIQUE: Multidetector CT imaging of the chest was performed using the standard protocol during bolus administration of intravenous contrast. Multiplanar CT image reconstructions and MIPs were obtained to evaluate the vascular anatomy. Multidetector CT imaging of the abdomen and pelvis was performed using the standard protocol during bolus administration of intravenous contrast. CONTRAST:   187m ISOVUE-370 IOPAMIDOL (ISOVUE-370) INJECTION 76% COMPARISON:  Chest radiograph Apr 17, 2018 FINDINGS: CTA CHEST FINDINGS Cardiovascular: There is no demonstrable pulmonary embolus. There is no thoracic aortic aneurysm or dissection. There are foci of calcification in the proximal visualized great vessels. There is atherosclerotic calcification in the thoracic aorta. There are scattered foci of coronary artery calcification. There is a small pericardial effusion. Mediastinum/Nodes: There is a partially calcified mass arising from the right lobe of the thyroid measuring 2.7 x 1.9 cm. Thyroid otherwise appears unremarkable. There is extensive adenopathy throughout the chest. There are multiple supraclavicular lymph nodes, largest measuring 1.0 x 0.9 cm. There are multiple axillary lymph nodes. The largest axillary lymph node on the left measures 2.5 x 2.1 cm cm. The largest axillary lymph node on the right measures 2.3 x 2.0 cm. There is adenopathy throughout the mediastinum. There are multiple aortopulmonary window region lymph nodes, largest measuring 1.4 x 1.4 cm. There is extensive adenopathy in the mediastinum surrounding the trachea and carina. There is a right pretracheal lymph node measuring 1.9 x 1.8 cm. There is a lymph node to the left of the origin of the carina measuring 1.6 x 1.5 cm. There is extensive adenopathy in the right hilar region. The largest individual lymph node in the right hilum measures 2.2 x 1.4 cm. There are multiple left hilar lymph nodes. The largest left hilar lymph node measures 1.8 x 1.3 cm. There is extensive subcarinal adenopathy. Largest subcarinal lymph node measures 2.8 x 2.4 cm. There are retrocrural lymph nodes bilaterally, largest on the left measuring  1.3 x 1.0 cm. There are several small lymph nodes at the level of the right pericardiophrenic angle. No esophageal lesions are evident. Lungs/Pleura: There are free-flowing pleural effusions bilaterally, larger on the right  than on the left. There is consolidation in both lung bases, likely primarily due to compressive atelectasis. There is atelectatic change with volume loss in the right middle lobe medially with focal consolidation adjacent to the right heart border in the medial segment right middle lobe. No pulmonary nodular type lesion is evident on this study. Musculoskeletal: There are no blastic or lytic bone lesions. No chest wall lesions are evident. Several benign-appearing calcifications are noted in the right breast. Review of the MIP images confirms the above findings. CT ABDOMEN and PELVIS FINDINGS Hepatobiliary: No focal liver lesions are appreciable. There is no appreciable gallbladder wall thickening. No biliary duct dilatation. Pancreas: No pancreatic mass or inflammatory focus. Extensive peripancreatic adenopathy is noted. Spleen: Spleen measures 15.0 x 12.4 x 6.8 cm with a measured splenic volume of 632 cubic cm. No focal splenic lesions are evident. Adrenals/Urinary Tract: Adrenals bilaterally appear unremarkable. Kidneys bilaterally show no evident mass or hydronephrosis. No renal or ureteral calculus. No hydronephrosis. Urinary bladder is midline with thickening of the urinary bladder wall. There is also mild soft tissue stranding surrounding the urinary bladder. Stomach/Bowel: There is no appreciable bowel wall or mesenteric thickening. There is no evident bowel obstruction. No free air or portal venous air. Vascular/Lymphatic: There is atherosclerotic calcification throughout the aorta and iliac arteries. There is moderate narrowing of the aorta with what appears to be hemodynamically significant obstruction in both common femoral arteries. No aneurysm. Major mesenteric arterial vessels appear patent. There is extensive adenopathy throughout the abdomen. There are multiple enlarged lymph nodes in the peripancreatic region, largest measuring 1.7 x 1.5 cm. Scattered prominent mesenteric lymph nodes are noted  throughout the abdomen. There is extensive retroperitoneal adenopathy. Largest retroperitoneal lymph node measures 2.5 x 1.5 cm. There is adenopathy to the right of the celiac axis measuring 2.3 x 1.5 cm. Adenopathy is seen in both external iliac node chains. The largest lymph node in this area on the right measures 2.6 x 1.7 cm. The largest lymph node in this region on the left measures 2.0 by 1.4 cm. There is also extensive adenopathy more posteriorly in the pelvis at the level of each acetabulum. The largest of these lymph nodes on the right measures 3.1 x 1.7 cm. Largest of these lymph nodes on the left measures 3.2 x 1.6 cm. There is adenopathy in both inguinal regions. Largest of these lymph nodes in the inguinal regions on the right measures 1.4 x 1.3 cm. Largest lymph node in the left inguinal region measures 1.8 x 1.6 cm. Reproductive: Uterus is apparently absent. No pelvic mass apart from adenopathy seen. Other: Appendix region appears normal. No abscess is evident in the abdomen or pelvis. There is mild ascites. Musculoskeletal: There are no appreciable blastic or lytic bone lesions. No intramuscular or abdominal wall lesions are evident. Review of the MIP images confirms the above findings. IMPRESSION: CT angiogram chest: 1. No demonstrable pulmonary embolus. No thoracic aortic aneurysm or dissection. Multiple foci of aortic atherosclerosis noted as well as foci of calcification in great vessels and coronary arteries. 2. Extensive adenopathy at multiple sites as summarized above. This extensive adenopathy raises concern for potential lymphoma. Small cell lung carcinoma could present in this manner as well. 3. Moderate pleural effusions bilaterally, larger on the right than on the  left, with compressive atelectasis in the lung bases. There is focal consolidation felt to represent pneumonia adjacent to the right heart border in the medial segment right middle lobe. 4. Partially calcified dominant mass  right lobe of thyroid. This mass may warrant nonemergent thyroid ultrasound to further evaluate. CT abdomen and pelvis: 1. Widespread abdominal and pelvic adenopathy. Splenomegaly. This combination of findings raises concern for lymphoma as most likely etiology. 2.  Mild ascites. 3. No abscess in the abdomen or pelvis. No periappendiceal region inflammation. 4. Extent extensive aortoiliac atherosclerosis with what appears to be hemodynamically significant obstruction in the common iliac arteries bilaterally. 5.  Uterus apparently absent. Aortic Atherosclerosis (ICD10-I70.0). Electronically Signed: By: Lowella Grip III M.D. On: 04/18/2018 08:41   Ct Abdomen Pelvis W Contrast  Addendum Date: 04/18/2018   ADDENDUM REPORT: 04/18/2018 08:59 ADDENDUM: Add to IMPRESSION: There is wall thickening in the urinary bladder with mild soft tissue stranding adjacent to the bladder. Suspect a degree of cystitis. Electronically Signed   By: Lowella Grip III M.D.   On: 04/18/2018 08:59   Result Date: 04/18/2018 CLINICAL DATA:  Shortness of breath.  Abdominal pain EXAM: CT ANGIOGRAPHY CHEST CT ABDOMEN AND PELVIS WITH CONTRAST TECHNIQUE: Multidetector CT imaging of the chest was performed using the standard protocol during bolus administration of intravenous contrast. Multiplanar CT image reconstructions and MIPs were obtained to evaluate the vascular anatomy. Multidetector CT imaging of the abdomen and pelvis was performed using the standard protocol during bolus administration of intravenous contrast. CONTRAST:  113m ISOVUE-370 IOPAMIDOL (ISOVUE-370) INJECTION 76% COMPARISON:  Chest radiograph Apr 17, 2018 FINDINGS: CTA CHEST FINDINGS Cardiovascular: There is no demonstrable pulmonary embolus. There is no thoracic aortic aneurysm or dissection. There are foci of calcification in the proximal visualized great vessels. There is atherosclerotic calcification in the thoracic aorta. There are scattered foci of coronary  artery calcification. There is a small pericardial effusion. Mediastinum/Nodes: There is a partially calcified mass arising from the right lobe of the thyroid measuring 2.7 x 1.9 cm. Thyroid otherwise appears unremarkable. There is extensive adenopathy throughout the chest. There are multiple supraclavicular lymph nodes, largest measuring 1.0 x 0.9 cm. There are multiple axillary lymph nodes. The largest axillary lymph node on the left measures 2.5 x 2.1 cm cm. The largest axillary lymph node on the right measures 2.3 x 2.0 cm. There is adenopathy throughout the mediastinum. There are multiple aortopulmonary window region lymph nodes, largest measuring 1.4 x 1.4 cm. There is extensive adenopathy in the mediastinum surrounding the trachea and carina. There is a right pretracheal lymph node measuring 1.9 x 1.8 cm. There is a lymph node to the left of the origin of the carina measuring 1.6 x 1.5 cm. There is extensive adenopathy in the right hilar region. The largest individual lymph node in the right hilum measures 2.2 x 1.4 cm. There are multiple left hilar lymph nodes. The largest left hilar lymph node measures 1.8 x 1.3 cm. There is extensive subcarinal adenopathy. Largest subcarinal lymph node measures 2.8 x 2.4 cm. There are retrocrural lymph nodes bilaterally, largest on the left measuring 1.3 x 1.0 cm. There are several small lymph nodes at the level of the right pericardiophrenic angle. No esophageal lesions are evident. Lungs/Pleura: There are free-flowing pleural effusions bilaterally, larger on the right than on the left. There is consolidation in both lung bases, likely primarily due to compressive atelectasis. There is atelectatic change with volume loss in the right middle lobe medially with focal consolidation  adjacent to the right heart border in the medial segment right middle lobe. No pulmonary nodular type lesion is evident on this study. Musculoskeletal: There are no blastic or lytic bone lesions.  No chest wall lesions are evident. Several benign-appearing calcifications are noted in the right breast. Review of the MIP images confirms the above findings. CT ABDOMEN and PELVIS FINDINGS Hepatobiliary: No focal liver lesions are appreciable. There is no appreciable gallbladder wall thickening. No biliary duct dilatation. Pancreas: No pancreatic mass or inflammatory focus. Extensive peripancreatic adenopathy is noted. Spleen: Spleen measures 15.0 x 12.4 x 6.8 cm with a measured splenic volume of 632 cubic cm. No focal splenic lesions are evident. Adrenals/Urinary Tract: Adrenals bilaterally appear unremarkable. Kidneys bilaterally show no evident mass or hydronephrosis. No renal or ureteral calculus. No hydronephrosis. Urinary bladder is midline with thickening of the urinary bladder wall. There is also mild soft tissue stranding surrounding the urinary bladder. Stomach/Bowel: There is no appreciable bowel wall or mesenteric thickening. There is no evident bowel obstruction. No free air or portal venous air. Vascular/Lymphatic: There is atherosclerotic calcification throughout the aorta and iliac arteries. There is moderate narrowing of the aorta with what appears to be hemodynamically significant obstruction in both common femoral arteries. No aneurysm. Major mesenteric arterial vessels appear patent. There is extensive adenopathy throughout the abdomen. There are multiple enlarged lymph nodes in the peripancreatic region, largest measuring 1.7 x 1.5 cm. Scattered prominent mesenteric lymph nodes are noted throughout the abdomen. There is extensive retroperitoneal adenopathy. Largest retroperitoneal lymph node measures 2.5 x 1.5 cm. There is adenopathy to the right of the celiac axis measuring 2.3 x 1.5 cm. Adenopathy is seen in both external iliac node chains. The largest lymph node in this area on the right measures 2.6 x 1.7 cm. The largest lymph node in this region on the left measures 2.0 by 1.4 cm. There  is also extensive adenopathy more posteriorly in the pelvis at the level of each acetabulum. The largest of these lymph nodes on the right measures 3.1 x 1.7 cm. Largest of these lymph nodes on the left measures 3.2 x 1.6 cm. There is adenopathy in both inguinal regions. Largest of these lymph nodes in the inguinal regions on the right measures 1.4 x 1.3 cm. Largest lymph node in the left inguinal region measures 1.8 x 1.6 cm. Reproductive: Uterus is apparently absent. No pelvic mass apart from adenopathy seen. Other: Appendix region appears normal. No abscess is evident in the abdomen or pelvis. There is mild ascites. Musculoskeletal: There are no appreciable blastic or lytic bone lesions. No intramuscular or abdominal wall lesions are evident. Review of the MIP images confirms the above findings. IMPRESSION: CT angiogram chest: 1. No demonstrable pulmonary embolus. No thoracic aortic aneurysm or dissection. Multiple foci of aortic atherosclerosis noted as well as foci of calcification in great vessels and coronary arteries. 2. Extensive adenopathy at multiple sites as summarized above. This extensive adenopathy raises concern for potential lymphoma. Small cell lung carcinoma could present in this manner as well. 3. Moderate pleural effusions bilaterally, larger on the right than on the left, with compressive atelectasis in the lung bases. There is focal consolidation felt to represent pneumonia adjacent to the right heart border in the medial segment right middle lobe. 4. Partially calcified dominant mass right lobe of thyroid. This mass may warrant nonemergent thyroid ultrasound to further evaluate. CT abdomen and pelvis: 1. Widespread abdominal and pelvic adenopathy. Splenomegaly. This combination of findings raises concern for lymphoma  as most likely etiology. 2.  Mild ascites. 3. No abscess in the abdomen or pelvis. No periappendiceal region inflammation. 4. Extent extensive aortoiliac atherosclerosis with  what appears to be hemodynamically significant obstruction in the common iliac arteries bilaterally. 5.  Uterus apparently absent. Aortic Atherosclerosis (ICD10-I70.0). Electronically Signed: By: Lowella Grip III M.D. On: 04/18/2018 08:41   Ct Bone Marrow Biopsy & Aspiration  Result Date: 05/05/2018 INDICATION: Lymphoma EXAM: CT BONE MARROW BIOPSY AND ASPIRATION MEDICATIONS: None. ANESTHESIA/SEDATION: Fentanyl 75 mcg IV; Versed 2 mg IV Moderate Sedation Time:  32 minutes The patient was continuously monitored during the procedure by the interventional radiology nurse under my direct supervision. FLUOROSCOPY TIME:  Fluoroscopy Time:  minutes  seconds ( mGy). COMPLICATIONS: None immediate. PROCEDURE: Informed written consent was obtained from the patient after a thorough discussion of the procedural risks, benefits and alternatives. All questions were addressed. Maximal Sterile Barrier Technique was utilized including caps, mask, sterile gowns, sterile gloves, sterile drape, hand hygiene and skin antiseptic. A timeout was performed prior to the initiation of the procedure. Under CT guidance, a(n) 11 gauge guide needle was advanced into the right iliac bone. Aspirates and a core were obtained. Post biopsy images demonstrate . Patient tolerated the procedure well without complication. Vital sign monitoring by nursing staff during the procedure will continue as patient is in the special procedures unit for post procedure observation. FINDINGS: The images document guide needle placement within the right iliac bone. Post biopsy images demonstrate no hemorrhage. IMPRESSION: Successful CT-guided right iliac bone marrow aspirate and core. Electronically Signed   By: Marybelle Killings M.D.   On: 05/05/2018 13:33   Ir Imaging Guided Port Insertion  Result Date: 05/07/2018 INDICATION: 62 year old female with a history of lymphoma. She has been referred for port catheter. EXAM: IMPLANTED PORT A CATH PLACEMENT WITH  ULTRASOUND AND FLUOROSCOPIC GUIDANCE MEDICATIONS: 2 g Ancef; The antibiotic was administered within an appropriate time interval prior to skin puncture. ANESTHESIA/SEDATION: Moderate (conscious) sedation was employed during this procedure. A total of Versed mg and Fentanyl 100 mcg was administered intravenously. Moderate Sedation Time: 20 minutes. The patient's level of consciousness and vital signs were monitored continuously by radiology nursing throughout the procedure under my direct supervision. FLUOROSCOPY TIME:  0 minutes, 6 seconds (1.5 mGy) COMPLICATIONS: None PROCEDURE: The procedure, risks, benefits, and alternatives were explained to the patient. Questions regarding the procedure were encouraged and answered. The patient understands and consents to the procedure. Ultrasound survey was performed with images stored and sent to PACs. The right neck and chest was prepped with chlorhexidine, and draped in the usual sterile fashion using maximum barrier technique (cap and mask, sterile gown, sterile gloves, large sterile sheet, hand hygiene and cutaneous antiseptic). Antibiotic prophylaxis was provided with 2.0g Ancef administered IV one hour prior to skin incision. Local anesthesia was attained by infiltration with 1% lidocaine without epinephrine. Ultrasound demonstrated patency of the right internal jugular vein, and this was documented with an image. Under real-time ultrasound guidance, this vein was accessed with a 21 gauge micropuncture needle and image documentation was performed. A small dermatotomy was made at the access site with an 11 scalpel. A 0.018" wire was advanced into the SVC and used to estimate the length of the internal catheter. The access needle exchanged for a 74F micropuncture vascular sheath. The 0.018" wire was then removed and a 0.035" wire advanced into the IVC. An appropriate location for the subcutaneous reservoir was selected below the clavicle and an incision was  made through  the skin and underlying soft tissues. The subcutaneous tissues were then dissected using a combination of blunt and sharp surgical technique and a pocket was formed. A single lumen power injectable portacatheter was then tunneled through the subcutaneous tissues from the pocket to the dermatotomy and the port reservoir placed within the subcutaneous pocket. The venous access site was then serially dilated and a peel away vascular sheath placed over the wire. The wire was removed and the port catheter advanced into position under fluoroscopic guidance. The catheter tip is positioned in the cavoatrial junction. This was documented with a spot image. The portacatheter was then tested and found to flush and aspirate well. The port was flushed with saline and Huber needle was left in place at the completion. The pocket was then closed in two layers using first subdermal inverted interrupted absorbable sutures followed by a running subcuticular suture. The epidermis was then sealed with Dermabond. The dermatotomy at the venous access site was also seal with Dermabond and Steri-Strips. Patient tolerated the procedure well and remained hemodynamically stable throughout. No complications encountered and no significant blood loss encountered . IMPRESSION: Status post right IJ port catheter placement. Catheter ready for use. Signed, Dulcy Fanny. Dellia Nims, RPVI [facility] Electronically Signed   By: Corrie Mckusick D.O.   On: 05/07/2018 17:19    ASSESSMENT & PLAN:  62 y.o. 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 2. General LNadenopathy from lymphoma with b/l pleural effusion and b/l lower extremity weakness  3. Anemia/thrombocytopenia -  from Bone marrow involvement by lymphoma BM Bx confirms involved by T cell lymphoma. Hgb upto 9.6 post transfusion with patient feeling  much better.  4. 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 LDH wnl no evidnece of overt hemolysis at this timne   5. CMV IgM+ -- will ceck CMV PCR quantitative (lab pending)  6. High risk for TLS - on allopurinol , received Rasburicase Uric acid 16--->8--> 4.6  7. Port a cath in situ PLAN -no acute toxicities from C1 CHOP at this time. -daily cbc/diff/platelets, cmp, uric acid -port a cath placed -nutritional consultation to optimize po intake -- will consider switching to Brentuximab + CHP as outpatient from C2. -granix daily for 5 days starting 24h after chemotherapy -will need outpatient PET/CT for baseline and response to treatment assessment. -PT/OT evaluation. -Discussed pt labwork today, 05/09/18; Sodium normalized, Hgb increased to 9.6 from 7.2, Albumin at 2.7. -Discussed the BM Bx results which confirmed that her lymphoma was present in the BM -LDH has normalized -Uric acid has normalized to 4.6 after prophylactic 19m Rasburicase -Continue eating well and emphasized that protein intake will help with fluid collection -Granix tomorrow to prevent ANC decrease and infection   I spent 30 minutes counseling the patient face to face. The total time spent in the appointment was 316mutes and more than 50% was on counseling and direct patient cares.    GaSullivan LoneD MS AAHIVMS SCEndoscopy Associates Of Valley ForgeTDiamond Grove Centerematology/Oncology Physician CoMarshall Medical Center(Office):       [phone] cell):  [phone]):           [phone]/21/2019 1:49 PM  I, ScBaldwin Jamaicaam acting as a scEducation administratoror Dr KaIrene Limbo  .I have reviewed the above documentation for accuracy and completeness, and I agree with the above. GaSullivan LoneD MS

## 2018-05-10 DIAGNOSIS — Z5111 Encounter for antineoplastic chemotherapy: Secondary | ICD-10-CM

## 2018-05-10 LAB — CBC WITH DIFFERENTIAL/PLATELET
BASOS PCT: 0 %
Basophils Absolute: 0 10*3/uL (ref 0.0–0.1)
EOS ABS: 0 10*3/uL (ref 0.0–0.7)
Eosinophils Relative: 0 %
HCT: 27.3 % — ABNORMAL LOW (ref 36.0–46.0)
HEMOGLOBIN: 8.9 g/dL — AB (ref 12.0–15.0)
Lymphocytes Relative: 12 %
Lymphs Abs: 0.4 10*3/uL — ABNORMAL LOW (ref 0.7–4.0)
MCH: 29.7 pg (ref 26.0–34.0)
MCHC: 32.6 g/dL (ref 30.0–36.0)
MCV: 91 fL (ref 78.0–100.0)
MONOS PCT: 12 %
Monocytes Absolute: 0.4 10*3/uL (ref 0.1–1.0)
NEUTROS PCT: 76 %
Neutro Abs: 2.3 10*3/uL (ref 1.7–7.7)
Platelets: 106 10*3/uL — ABNORMAL LOW (ref 150–400)
RBC: 3 MIL/uL — ABNORMAL LOW (ref 3.87–5.11)
RDW: 16.4 % — ABNORMAL HIGH (ref 11.5–15.5)
WBC: 3 10*3/uL — AB (ref 4.0–10.5)

## 2018-05-10 LAB — GLUCOSE, CAPILLARY
GLUCOSE-CAPILLARY: 130 mg/dL — AB (ref 65–99)
Glucose-Capillary: 211 mg/dL — ABNORMAL HIGH (ref 65–99)
Glucose-Capillary: 222 mg/dL — ABNORMAL HIGH (ref 65–99)
Glucose-Capillary: 252 mg/dL — ABNORMAL HIGH (ref 65–99)

## 2018-05-10 LAB — MAGNESIUM: Magnesium: 1.9 mg/dL (ref 1.7–2.4)

## 2018-05-10 LAB — COMPREHENSIVE METABOLIC PANEL
ALBUMIN: 2.6 g/dL — AB (ref 3.5–5.0)
ALK PHOS: 48 U/L (ref 38–126)
ALT: 19 U/L (ref 14–54)
AST: 28 U/L (ref 15–41)
Anion gap: 4 — ABNORMAL LOW (ref 5–15)
BUN: 24 mg/dL — ABNORMAL HIGH (ref 6–20)
CHLORIDE: 107 mmol/L (ref 101–111)
CO2: 27 mmol/L (ref 22–32)
Calcium: 8.1 mg/dL — ABNORMAL LOW (ref 8.9–10.3)
Creatinine, Ser: 0.56 mg/dL (ref 0.44–1.00)
GFR calc Af Amer: >60 mL/min (ref >60)
GFR calc non Af Amer: >60 mL/min (ref >60)
GLUCOSE: 234 mg/dL — AB (ref 65–99)
POTASSIUM: 3.9 mmol/L (ref 3.5–5.1)
SODIUM: 138 mmol/L (ref 135–145)
Total Bilirubin: 0.4 mg/dL (ref 0.3–1.2)
Total Protein: 7.1 g/dL (ref 6.5–8.1)

## 2018-05-10 LAB — CMV DNA, QUANTITATIVE, PCR
CMV DNA Quant: 8430 IU/mL
Log10 CMV Qn DNA Pl: 3.926 log10 IU/mL

## 2018-05-10 LAB — PHOSPHORUS: Phosphorus: 3.1 mg/dL (ref 2.5–4.6)

## 2018-05-10 LAB — URIC ACID: URIC ACID, SERUM: 3.8 mg/dL (ref 2.3–6.6)

## 2018-05-10 MED ORDER — TBO-FILGRASTIM 480 MCG/0.8ML ~~LOC~~ SOSY
480.0000 ug | PREFILLED_SYRINGE | Freq: Every day | SUBCUTANEOUS | Status: DC
  Administered 2018-05-10 – 2018-05-12 (×3): 480 ug via SUBCUTANEOUS
  Filled 2018-05-10 (×3): qty 0.8

## 2018-05-10 NOTE — Progress Notes (Signed)
PROGRESS NOTE    Deborah Cooley  PPI:[id number] DOB: September 28, 1956 DOA: 04/23/2018 PCP: Patient, No Pcp Per  Brief Narrative:62 yo with PMH of HTN, endometriosis s/p hysterectomy, tobacco use and recent hospitalization for multifocal lymphadenopathy and concern for lymphoma who presents for evaluation of recurrent shortness of breath and abdominal pain. History taken directly from the patient. Patient states that since discharge from hospital 3 days ago she has felt ongoing shortness of breath and swelling/pain in abdomen. Patient states both of these symptoms were present during previous hospitalization. She had some mild relief of symptoms with procedure done to drain fluid from her lungs while in hospital, but symptoms quickly returned once she got home. Since the day of discharge patient has experienced difficulty ambulating 2/2 shortness of breath and difficulty sleeping. Patient notes symptoms worse when laying flat and it feels as if she will drown/choke on her fluid at night. Because of this patient hasn't really slept since leaving the hospital. Patient thinks that her abdominal swelling noted last admission has gotten worse and she now has pain, described as increased fullness/pressure, in her upper abdomen whenever she takes in a deep breath. No fevers, difficulties with urination, bowel movements, headaches, or nausea/vomiting.   Upon arrival to the ED patient was afebrile, intermittently tachycardic, normotensive, and saturating >94% on room air. Patient's CBC with Hgb 10.9, WBC 7.7, platelets 178. BMP within normal limits. BNP = 78. ISTAT lactic acid elevated to 2.43, repeat 1.69. EKG with sinus tachycardia, but no acute changes concerning for ACS. ISTAT troponin within normal limits. Patient had CXR which showed increased bilateral pleural effusions, diffuse interstitial prominence, patchy airspace opacity in RML, and LLL atelectasis. Patient had ongoing SOB in ED and IMTS called for  admission.     Assessment & Plan:   Principal Problem:   Angioimmunoblastic lymphoma ([place]) Active Problems:   Pleural effusion   Diffuse lymphadenopathy   Dyspnea   Costochondritis   Subclinical hypothyroidism   Hyponatremia   Anemia in neoplastic disease   Thrombocytopenia ([facility])   At high risk of tumor lysis syndrome   Encounter for antineoplastic chemotherapy  1] newly diagnosed T-cell lymphoma status post chemotherapy 05/08/2018.  Oncology notes reviewed.  Patient to get Granix  starting today.  She was started on steroids allopurinol and rasburicase for prophylaxis of tumor lysis syndrome.  Daily CBC and BMP.  Patient with bilateral pleural effusion moderate to large right and small to moderate left pleural effusion.  No evidence of pneumonia or pulmonary edema.  Patient and thoracentesis done during this hospitalization.  Will arrange for IR to do thoracentesis on the right.  Patient continues to have dependent edema continue TED hose during the day elevate, lower extremities as much as possible.  Ejection fraction was normal by echocardiogram this admission.  Uric acid down to 4.6.  Labs pending for today  2] anemia and thrombocytopenia due to bone marrow involvement by lymphoma status post blood transfusion.  Not on Lovenox due to thrombocytopenia and anemia.  3] AKI and hyponatremia resolved.  4] hyperglycemia secondary to steroids    DVT prophylaxis: ted Code Status: full Family Communication:none Disposition Plan: tbd Consultants: dr Irene Limbo  Procedures: Biopsy 05/06/2018 status post post Port-A-Cath placement 05/07/2018. Antimicrobials: None  Subjective: No specific complaints resting in bed with feet elevated  states she is breathing okay but placed back on oxygen last night.  Objective: Vitals:   05/09/18 2107 05/10/18 0619 05/10/18 0722 05/10/18 0808  BP:  (!) 106/55  Pulse:  98    Resp:  20    Temp:  98 F (36.7 C)    TempSrc:  Oral    SpO2:  91%  94%   Weight: 66.1 kg (145 lb 11.6 oz)  67.1 kg (148 lb)   Height: 5' 1.81" (1.57 m)       Intake/Output Summary (Last 24 hours) at 05/10/2018 1026 Last data filed at 05/09/2018 2155 Gross per 24 hour  Intake 120 ml  Output -  Net 120 ml   Filed Weights   05/09/18 0100 05/09/18 2107 05/10/18 0722  Weight: 66.1 kg (145 lb 11.6 oz) 66.1 kg (145 lb 11.6 oz) 67.1 kg (148 lb)    Examination:  General exam: Appears calm and comfortable  Respiratory system: Decreased breath sounds at the bases. Respiratory effort normal. Cardiovascular system: S1 & S2 heard, RRR. No JVD, murmurs, rubs, gallops or clicks. No pedal edema. Gastrointestinal system: Abdomen is nondistended, soft and nontender. No organomegaly or masses felt. Normal bowel sounds heard. Central nervous system: Alert and oriented. No focal neurological deficits. Extremities:2 plus edema Skin: No rashes, lesions or ulcers Psychiatry: Judgement and insight appear normal. Mood & affect appropriate.     Data Reviewed: I have personally reviewed following labs and imaging studies  CBC: Recent Labs  Lab 05/04/18 0819 05/05/18 0536 05/06/18 0554 05/07/18 0408 05/08/18 0645 05/09/18 0650  WBC 6.7 5.7 5.7 4.6 4.5 4.2  NEUTROABS 4.7  --   --  2.9  --  3.0  HGB 8.4* 7.7* 7.7* 7.8* 7.2* 9.6*  HCT 24.4* 22.7* 22.7* 23.2* 21.7* 29.1*  MCV 87.1 87.6 93.4 91.0 93.5 90.1  PLT 68* 89* 118* 135* 124* 332*   Basic Metabolic Panel: Recent Labs  Lab 05/05/18 0536 05/06/18 0554 05/07/18 0408 05/08/18 0645 05/09/18 0650  NA 126* 129* 134* 136 139  K 4.4 4.7 5.0 4.3 4.4  CL 98* 100* 105 106 107  CO2 21* 22 26 27 29   GLUCOSE 201* 189* 238* 133* 142*  BUN 30* 29* 31* 26* 25*  CREATININE 0.86 0.72 0.81 0.66 0.68  CALCIUM 8.1* 8.4* 8.3* 8.2* 8.0*  MG  --   --  2.3  --   --   PHOS  --   --  3.6  --   --    GFR: Estimated Creatinine Clearance: 65.3 mL/min (by C-G formula based on SCr of 0.68 mg/dL). Liver Function Tests: Recent Labs   Lab 05/07/18 0408 05/09/18 0650  AST 24 22  ALT 19 17  ALKPHOS 52 46  BILITOT 0.6 0.4  PROT 8.1 7.8  ALBUMIN 2.8* 2.7*   No results for input(s): LIPASE, AMYLASE in the last 168 hours. No results for input(s): AMMONIA in the last 168 hours. Coagulation Profile: Recent Labs  Lab 05/03/18 1407  INR 1.03   Cardiac Enzymes: No results for input(s): CKTOTAL, CKMB, CKMBINDEX, TROPONINI in the last 168 hours. BNP (last 3 results) No results for input(s): PROBNP in the last 8760 hours. HbA1C: No results for input(s): HGBA1C in the last 72 hours. CBG: Recent Labs  Lab 05/08/18 1700 05/08/18 2150 05/09/18 1707 05/09/18 2110 05/10/18 0740  GLUCAP 182* 201* 229* 221* 130*   Lipid Profile: No results for input(s): CHOL, HDL, LDLCALC, TRIG, CHOLHDL, LDLDIRECT in the last 72 hours. Thyroid Function Tests: No results for input(s): TSH, T4TOTAL, FREET4, T3FREE, THYROIDAB in the last 72 hours. Anemia Panel: No results for input(s): VITAMINB12, FOLATE, FERRITIN, TIBC, IRON, RETICCTPCT in the last 72 hours.  Sepsis Labs: No results for input(s): PROCALCITON, LATICACIDVEN in the last 168 hours.  No results found for this or any previous visit (from the past 240 hour(s)).       Radiology Studies: Dg Chest 1 View  Result Date: 05/08/2018 CLINICAL DATA:  Wheezing EXAM: CHEST  1 VIEW COMPARISON:  Chest x-ray of 04/23/2017 FINDINGS: There has been increase in volume of right pleural effusion with increasing right basilar atelectasis. A smaller left pleural effusion remains with probable partial atelectasis at the left lung base as well. The heart is mildly enlarged. A Port-A-Cath is now present with the tip overlying the mid lower SVC. No pneumothorax is seen. No bony abnormality is noted. IMPRESSION: 1. Increase in volume of right pleural effusion with increasing right basilar atelectasis. 2. Small left pleural effusion remains with probable partial atelectasis of the left lung base as  well. 3. Port-A-Cath tip overlies the mid lower SVC. Electronically Signed   By: Ivar Drape M.D.   On: 05/08/2018 11:51   Dg Chest 2 View  Result Date: 05/09/2018 CLINICAL DATA:  Short of breath for 2 days. Cough yesterday but not today. Followup pleural effusion. EXAM: CHEST - 2 VIEW COMPARISON:  07/08/2018 and older exams. FINDINGS: Moderate to large right pleural effusion obscures the right hemidiaphragm and right heart border. Small to moderate left pleural effusion obscures the left hemidiaphragm. There are prominent vascular markings bilaterally without evidence of pulmonary edema. No evidence of pneumonia. No pneumothorax. Cardiac silhouette is normal in size. No mediastinal or hilar masses. Right anterior chest wall Port-A-Cath is stable. IMPRESSION: 1. Moderate to large right and small to moderate left pleural effusions similar to the previous day's study. No evidence of pneumonia or pulmonary edema. Electronically Signed   By: Lajean Manes M.D.   On: 05/09/2018 15:02        Scheduled Meds: . sodium chloride   Intravenous Once  . sodium chloride   Intravenous Once  . allopurinol  300 mg Oral Daily  . feeding supplement  1 Container Oral TID BM  . insulin aspart  0-5 Units Subcutaneous QHS  . insulin aspart  0-9 Units Subcutaneous TID WC  . ipratropium-albuterol  3 mL Nebulization BID  . predniSONE  60 mg Oral Q breakfast  . ramelteon  8 mg Oral QHS  . sodium chloride flush  3 mL Intravenous Q12H  . Tbo-filgastrim (GRANIX) SQ  480 mcg Subcutaneous q1800   Continuous Infusions: . sodium chloride 50 mL/hr at 05/09/18 1741  . sodium chloride       LOS: 15 days      Georgette Shell, MD [facility]  If 7PM-7AM, please contact night-coverage [email or web address] Password [facility] 05/10/2018, 10:26 AM

## 2018-05-10 NOTE — Progress Notes (Signed)
HEMATOLOGY/ONCOLOGY INPATIENT PROGRESS NOTE  Date of Service: 05/10/2018  Inpatient Attending: .Georgette Shell, MD   SUBJECTIVE:   Patient notes that she is feeling better. Some improvement in appetite. Decreasing leg swelling. Notes that she is not sure she needs the oxygen - we discussed that it will be weaned as possible.   OBJECTIVE:  NAD  PHYSICAL EXAMINATION: . Vitals:   05/09/18 2107 05/10/18 0619 05/10/18 0722 05/10/18 0808  BP:  (!) 106/55    Pulse:  98    Resp:  20    Temp:  98 F (36.7 C)    TempSrc:  Oral    SpO2:  91%  94%  Weight: 145 lb 11.6 oz (66.1 kg)  148 lb (67.1 kg)   Height: 5' 1.81" (1.57 m)      Filed Weights   05/09/18 0100 05/09/18 2107 05/10/18 0722  Weight: 145 lb 11.6 oz (66.1 kg) 145 lb 11.6 oz (66.1 kg) 148 lb (67.1 kg)   .Body mass index is 27.24 kg/m.  GENERAL:alert, in no acute distress and comfortable SKIN: skin color, texture, turgor are normal, no rashes or significant lesions EYES: normal, conjunctiva are pink and non-injected, sclera clear OROPHARYNX:no exudate, no erythema and lips, buccal mucosa, and tongue normal  NECK: supple, no JVD, thyroid normal size, non-tender, without nodularity LYMPH:  no palpable lymphadenopathy in the cervical, axillary or inguinal LUNGS: decreased air entry b/l lung bases  HEART: regular rate & rhythm,  ABDOMEN: abdomen mildly distended, no TTP. Normoactive Bowel sounds Musculoskeletal: 2+ pedal edema PSYCH: alert & oriented x 3 with fluent speech NEURO: no focal motor/sensory deficits  MEDICAL HISTORY:  Past Medical History:  Diagnosis Date  . Bilateral pleural effusion 04/17/2018  . Endometriosis   . Essential hypertension, benign   . Family history of adverse reaction to anesthesia    "sister stops breathing I think" (04/18/2018)  . Heart murmur   . Mixed hyperlipidemia   . Pneumonia 04/17/2018    SURGICAL HISTORY: Past Surgical History:  Procedure Laterality Date  .  ABDOMINAL HYSTERECTOMY  1970s/1980s   for endometriosis  . Arthroscopic right shoulder surgery    . AXILLARY LYMPH NODE BIOPSY Left 04/21/2018   Procedure: LEFT AXILLARY LYMPH NODE BIOPSY;  Surgeon: Kieth Brightly, Arta Bruce, MD;  Location: [facility];  Service: General;  Laterality: Left;  . CARPAL TUNNEL RELEASE Right   . GANGLION CYST EXCISION Right   . IR IMAGING GUIDED PORT INSERTION  05/07/2018  . MULTIPLE TOOTH EXTRACTIONS    . OOPHORECTOMY  1997  . PARTIAL HYSTERECTOMY  1984  . Right hand surgery    . SHOULDER OPEN ROTATOR CUFF REPAIR Right   . VESICOVAGINAL FISTULA CLOSURE W/ TAH      SOCIAL HISTORY: Social History   Socioeconomic History  . Marital status: Married    Spouse name: Not on file  . Number of children: Not on file  . Years of education: Not on file  . Highest education level: Not on file  Occupational History  . Occupation: Social research officer, government    Comment: works at [employer]  . Financial resource strain: Not on file  . Food insecurity:    Worry: Not on file    Inability: Not on file  . Transportation needs:    Medical: Not on file    Non-medical: Not on file  Tobacco Use  . Smoking status: Current Every Day Smoker    Packs/day: 1.50    Years: 46.00  Pack years: 69.00    Types: Cigarettes  . Smokeless tobacco: Never Used  Substance and Sexual Activity  . Alcohol use: Not Currently    Comment: 04/18/2018 "quit years ago"  . Drug use: Not Currently    Comment: "back in my 61s"  . Sexual activity: Not Currently  Lifestyle  . Physical activity:    Days per week: Not on file    Minutes per session: Not on file  . Stress: Not on file  Relationships  . Social connections:    Talks on phone: Not on file    Gets together: Not on file    Attends religious service: Not on file    Active member of club or organization: Not on file    Attends meetings of clubs or organizations: Not on file    Relationship status: Not on file  . Intimate  partner violence:    Fear of current or ex partner: Not on file    Emotionally abused: Not on file    Physically abused: Not on file    Forced sexual activity: Not on file  Other Topics Concern  . Not on file  Social History Narrative   ** Merged History Encounter **       Patient has one son whom is healthy.   Has a live in boyfriend.    FAMILY HISTORY: Family History  Problem Relation Age of Onset  . Diabetes Mother        age 63  . Heart attack Father        died age 25's  . Hypertension Father   . Cancer Sister        breast cancer diagonsed age 74 years  . Diabetes Unknown   . Heart disease Unknown        Female <55  . Arthritis Unknown   . Asthma Unknown   . Hyperlipidemia Other        several siblings  . Thyroid disease Sister        one sister with thyroid disease    ALLERGIES:  has No Known Allergies.  MEDICATIONS:  Scheduled Meds: . sodium chloride   Intravenous Once  . sodium chloride   Intravenous Once  . allopurinol  300 mg Oral Daily  . feeding supplement  1 Container Oral TID BM  . insulin aspart  0-5 Units Subcutaneous QHS  . insulin aspart  0-9 Units Subcutaneous TID WC  . ipratropium-albuterol  3 mL Nebulization BID  . predniSONE  60 mg Oral Q breakfast  . ramelteon  8 mg Oral QHS  . sodium chloride flush  3 mL Intravenous Q12H  . Tbo-filgastrim (GRANIX) SQ  480 mcg Subcutaneous q1800   Continuous Infusions: . sodium chloride 50 mL/hr at 05/09/18 1741  . sodium chloride     PRN Meds:.acetaminophen **OR** acetaminophen, alteplase, diclofenac sodium, diphenhydrAMINE, guaiFENesin-dextromethorphan, heparin lock flush, HYDROmorphone (DILAUDID) injection, ipratropium-albuterol, polyethylene glycol, prochlorperazine, sodium chloride flush, sodium chloride flush  REVIEW OF SYSTEMS:    10 Point review of Systems was done is negative except as noted above.   LABORATORY DATA:  I have reviewed the data as listed  . CBC Latest Ref Rng & Units  05/10/2018 05/09/2018 05/08/2018  WBC 4.0 - 10.5 K/uL 3.0(L) 4.2 4.5  Hemoglobin 12.0 - 15.0 g/dL 8.9(L) 9.6(L) 7.2(L)  Hematocrit 36.0 - 46.0 % 27.3(L) 29.1(L) 21.7(L)  Platelets 150 - 400 K/uL 106(L) 112(L) 124(L)    . CMP Latest Ref Rng & Units 05/10/2018 05/09/2018  05/08/2018  Glucose 65 - 99 mg/dL 234(H) 142(H) 133(H)  BUN 6 - 20 mg/dL 24(H) 25(H) 26(H)  Creatinine 0.44 - 1.00 mg/dL 0.56 0.68 0.66  Sodium 135 - 145 mmol/L 138 139 136  Potassium 3.5 - 5.1 mmol/L 3.9 4.4 4.3  Chloride 101 - 111 mmol/L 107 107 106  CO2 22 - 32 mmol/L _0 Calcium 8.9 - 10.3 mg/dL 8.1(L) 8.0(L) 8.2(L)  Total Protein 6.5 - 8.1 g/dL 7.1 7.8 -  Total Bilirubin 0.3 - 1.2 mg/dL 0.4 0.4 -  Alkaline Phos 38 - 126 U/L 48 46 -  AST 15 - 41 U/L 28 22 -  ALT 14 - 54 U/L 19 17 -    Component     Latest Ref Rng & Units 05/10/2018  Magnesium     1.7 - 2.4 mg/dL 1.9  Phosphorus     2.5 - 4.6 mg/dL 3.1  Uric Acid, Serum     2.3 - 6.6 mg/dL 3.8    RADIOGRAPHIC STUDIES: I have personally reviewed the radiological images as listed and agreed with the findings in the report. Dg Chest 1 View  Result Date: 05/08/2018 CLINICAL DATA:  Wheezing EXAM: CHEST  1 VIEW COMPARISON:  Chest x-ray of 04/23/2017 FINDINGS: There has been increase in volume of right pleural effusion with increasing right basilar atelectasis. A smaller left pleural effusion remains with probable partial atelectasis at the left lung base as well. The heart is mildly enlarged. A Port-A-Cath is now present with the tip overlying the mid lower SVC. No pneumothorax is seen. No bony abnormality is noted. IMPRESSION: 1. Increase in volume of right pleural effusion with increasing right basilar atelectasis. 2. Small left pleural effusion remains with probable partial atelectasis of the left lung base as well. 3. Port-A-Cath tip overlies the mid lower SVC. Electronically Signed   By: Ivar Drape M.D.   On: 05/08/2018 11:51   Dg Chest 2 View  Result Date:  05/09/2018 CLINICAL DATA:  Short of breath for 2 days. Cough yesterday but not today. Followup pleural effusion. EXAM: CHEST - 2 VIEW COMPARISON:  07/08/2018 and older exams. FINDINGS: Moderate to large right pleural effusion obscures the right hemidiaphragm and right heart border. Small to moderate left pleural effusion obscures the left hemidiaphragm. There are prominent vascular markings bilaterally without evidence of pulmonary edema. No evidence of pneumonia. No pneumothorax. Cardiac silhouette is normal in size. No mediastinal or hilar masses. Right anterior chest wall Port-A-Cath is stable. IMPRESSION: 1. Moderate to large right and small to moderate left pleural effusions similar to the previous day's study. No evidence of pneumonia or pulmonary edema. Electronically Signed   By: Lajean Manes M.D.   On: 05/09/2018 15:02   Dg Chest 2 View  Result Date: 04/23/2018 CLINICAL DATA:  Recent thoracentesis, worsening shortness of breath would like swelling. Productive cough. History of pleural effusion, pneumonia, left axillary node biopsy April 21, 2018. EXAM: CHEST - 2 VIEW COMPARISON:  Chest radiograph April 19, 2018 and CT chest Apr 18, 2018 FINDINGS: Increasing small bilateral pleural effusions. Diffuse interstitial prominence. Increased lung volumes and flattened hemidiaphragms. Worsening patchy right middle lobe airspace opacity. Left lung base atelectasis. Cardiac silhouette mildly enlarged. Calcified aortic arch. No pneumothorax. Calcified right thyroid nodule. Soft tissue planes and included osseous structures are not suspicious. IMPRESSION: Increasing small bilateral pleural effusions. Worsening right middle lobe airspace opacity with left lung base atelectasis. Mild cardiomegaly.  COPD. Aortic Atherosclerosis (ICD10-I70.0). Electronically Signed   By: Sandie Ano  Bloomer M.D.   On: 04/23/2018 21:15   Dg Chest 2 View  Result Date: 04/19/2018 CLINICAL DATA:  Productive cough and shortness of breath for 1  week. EXAM: CHEST - 2 VIEW COMPARISON:  PA and lateral chest 04/17/2018. CT chest, abdomen and pelvis 04/18/2018. FINDINGS: Small bilateral pleural effusions are seen, greater on the right. Basilar atelectasis is noted. No consolidative process or pneumothorax. Heart size is normal. Aortic atherosclerosis is noted. Calcified lesion in the right lobe of the thyroid is noted. No acute bony abnormality. IMPRESSION: Small bilateral pleural effusions and mild basilar atelectasis. Atherosclerosis. Electronically Signed   By: Inge Rise M.D.   On: 04/19/2018 13:55   Dg Chest 2 View  Result Date: 04/17/2018 CLINICAL DATA:  Abdominal pain.  Shortness of breath. EXAM: CHEST - 2 VIEW COMPARISON:  None. FINDINGS: The heart size is normal. The hila and mediastinum are unremarkable. A 2.2 cm eggshell calcification is seen in the right thoracic inlet. No pneumothorax. Mild interstitial prominence bilaterally. Small bilateral pleural effusions. No nodules or masses. No other acute abnormalities. IMPRESSION: 1. Mild interstitial opacities in the lungs are nonspecific. Edema could have this appearance but the lack of cardiomegaly would be unusual. Atypical infection is possible. 2. Small bilateral effusions. 3. The eggshell calcification in the right thoracic inlet is nonspecific. This could be a vascular structure or a calcified nodule in the thyroid. Electronically Signed   By: Dorise Bullion III M.D   On: 04/17/2018 23:32   Ct Angio Chest Pe W/cm &/or Wo Cm  Addendum Date: 04/18/2018   ADDENDUM REPORT: 04/18/2018 08:59 ADDENDUM: Add to IMPRESSION: There is wall thickening in the urinary bladder with mild soft tissue stranding adjacent to the bladder. Suspect a degree of cystitis. Electronically Signed   By: Lowella Grip III M.D.   On: 04/18/2018 08:59   Result Date: 04/18/2018 CLINICAL DATA:  Shortness of breath.  Abdominal pain EXAM: CT ANGIOGRAPHY CHEST CT ABDOMEN AND PELVIS WITH CONTRAST TECHNIQUE:  Multidetector CT imaging of the chest was performed using the standard protocol during bolus administration of intravenous contrast. Multiplanar CT image reconstructions and MIPs were obtained to evaluate the vascular anatomy. Multidetector CT imaging of the abdomen and pelvis was performed using the standard protocol during bolus administration of intravenous contrast. CONTRAST:  171m ISOVUE-370 IOPAMIDOL (ISOVUE-370) INJECTION 76% COMPARISON:  Chest radiograph Apr 17, 2018 FINDINGS: CTA CHEST FINDINGS Cardiovascular: There is no demonstrable pulmonary embolus. There is no thoracic aortic aneurysm or dissection. There are foci of calcification in the proximal visualized great vessels. There is atherosclerotic calcification in the thoracic aorta. There are scattered foci of coronary artery calcification. There is a small pericardial effusion. Mediastinum/Nodes: There is a partially calcified mass arising from the right lobe of the thyroid measuring 2.7 x 1.9 cm. Thyroid otherwise appears unremarkable. There is extensive adenopathy throughout the chest. There are multiple supraclavicular lymph nodes, largest measuring 1.0 x 0.9 cm. There are multiple axillary lymph nodes. The largest axillary lymph node on the left measures 2.5 x 2.1 cm cm. The largest axillary lymph node on the right measures 2.3 x 2.0 cm. There is adenopathy throughout the mediastinum. There are multiple aortopulmonary window region lymph nodes, largest measuring 1.4 x 1.4 cm. There is extensive adenopathy in the mediastinum surrounding the trachea and carina. There is a right pretracheal lymph node measuring 1.9 x 1.8 cm. There is a lymph node to the left of the origin of the carina measuring 1.6 x 1.5 cm. There is  extensive adenopathy in the right hilar region. The largest individual lymph node in the right hilum measures 2.2 x 1.4 cm. There are multiple left hilar lymph nodes. The largest left hilar lymph node measures 1.8 x 1.3 cm. There is  extensive subcarinal adenopathy. Largest subcarinal lymph node measures 2.8 x 2.4 cm. There are retrocrural lymph nodes bilaterally, largest on the left measuring 1.3 x 1.0 cm. There are several small lymph nodes at the level of the right pericardiophrenic angle. No esophageal lesions are evident. Lungs/Pleura: There are free-flowing pleural effusions bilaterally, larger on the right than on the left. There is consolidation in both lung bases, likely primarily due to compressive atelectasis. There is atelectatic change with volume loss in the right middle lobe medially with focal consolidation adjacent to the right heart border in the medial segment right middle lobe. No pulmonary nodular type lesion is evident on this study. Musculoskeletal: There are no blastic or lytic bone lesions. No chest wall lesions are evident. Several benign-appearing calcifications are noted in the right breast. Review of the MIP images confirms the above findings. CT ABDOMEN and PELVIS FINDINGS Hepatobiliary: No focal liver lesions are appreciable. There is no appreciable gallbladder wall thickening. No biliary duct dilatation. Pancreas: No pancreatic mass or inflammatory focus. Extensive peripancreatic adenopathy is noted. Spleen: Spleen measures 15.0 x 12.4 x 6.8 cm with a measured splenic volume of 632 cubic cm. No focal splenic lesions are evident. Adrenals/Urinary Tract: Adrenals bilaterally appear unremarkable. Kidneys bilaterally show no evident mass or hydronephrosis. No renal or ureteral calculus. No hydronephrosis. Urinary bladder is midline with thickening of the urinary bladder wall. There is also mild soft tissue stranding surrounding the urinary bladder. Stomach/Bowel: There is no appreciable bowel wall or mesenteric thickening. There is no evident bowel obstruction. No free air or portal venous air. Vascular/Lymphatic: There is atherosclerotic calcification throughout the aorta and iliac arteries. There is moderate  narrowing of the aorta with what appears to be hemodynamically significant obstruction in both common femoral arteries. No aneurysm. Major mesenteric arterial vessels appear patent. There is extensive adenopathy throughout the abdomen. There are multiple enlarged lymph nodes in the peripancreatic region, largest measuring 1.7 x 1.5 cm. Scattered prominent mesenteric lymph nodes are noted throughout the abdomen. There is extensive retroperitoneal adenopathy. Largest retroperitoneal lymph node measures 2.5 x 1.5 cm. There is adenopathy to the right of the celiac axis measuring 2.3 x 1.5 cm. Adenopathy is seen in both external iliac node chains. The largest lymph node in this area on the right measures 2.6 x 1.7 cm. The largest lymph node in this region on the left measures 2.0 by 1.4 cm. There is also extensive adenopathy more posteriorly in the pelvis at the level of each acetabulum. The largest of these lymph nodes on the right measures 3.1 x 1.7 cm. Largest of these lymph nodes on the left measures 3.2 x 1.6 cm. There is adenopathy in both inguinal regions. Largest of these lymph nodes in the inguinal regions on the right measures 1.4 x 1.3 cm. Largest lymph node in the left inguinal region measures 1.8 x 1.6 cm. Reproductive: Uterus is apparently absent. No pelvic mass apart from adenopathy seen. Other: Appendix region appears normal. No abscess is evident in the abdomen or pelvis. There is mild ascites. Musculoskeletal: There are no appreciable blastic or lytic bone lesions. No intramuscular or abdominal wall lesions are evident. Review of the MIP images confirms the above findings. IMPRESSION: CT angiogram chest: 1. No demonstrable pulmonary embolus.  No thoracic aortic aneurysm or dissection. Multiple foci of aortic atherosclerosis noted as well as foci of calcification in great vessels and coronary arteries. 2. Extensive adenopathy at multiple sites as summarized above. This extensive adenopathy raises concern  for potential lymphoma. Small cell lung carcinoma could present in this manner as well. 3. Moderate pleural effusions bilaterally, larger on the right than on the left, with compressive atelectasis in the lung bases. There is focal consolidation felt to represent pneumonia adjacent to the right heart border in the medial segment right middle lobe. 4. Partially calcified dominant mass right lobe of thyroid. This mass may warrant nonemergent thyroid ultrasound to further evaluate. CT abdomen and pelvis: 1. Widespread abdominal and pelvic adenopathy. Splenomegaly. This combination of findings raises concern for lymphoma as most likely etiology. 2.  Mild ascites. 3. No abscess in the abdomen or pelvis. No periappendiceal region inflammation. 4. Extent extensive aortoiliac atherosclerosis with what appears to be hemodynamically significant obstruction in the common iliac arteries bilaterally. 5.  Uterus apparently absent. Aortic Atherosclerosis (ICD10-I70.0). Electronically Signed: By: Lowella Grip III M.D. On: 04/18/2018 08:41   Ct Abdomen Pelvis W Contrast  Addendum Date: 04/18/2018   ADDENDUM REPORT: 04/18/2018 08:59 ADDENDUM: Add to IMPRESSION: There is wall thickening in the urinary bladder with mild soft tissue stranding adjacent to the bladder. Suspect a degree of cystitis. Electronically Signed   By: Lowella Grip III M.D.   On: 04/18/2018 08:59   Result Date: 04/18/2018 CLINICAL DATA:  Shortness of breath.  Abdominal pain EXAM: CT ANGIOGRAPHY CHEST CT ABDOMEN AND PELVIS WITH CONTRAST TECHNIQUE: Multidetector CT imaging of the chest was performed using the standard protocol during bolus administration of intravenous contrast. Multiplanar CT image reconstructions and MIPs were obtained to evaluate the vascular anatomy. Multidetector CT imaging of the abdomen and pelvis was performed using the standard protocol during bolus administration of intravenous contrast. CONTRAST:  134m ISOVUE-370 IOPAMIDOL  (ISOVUE-370) INJECTION 76% COMPARISON:  Chest radiograph Apr 17, 2018 FINDINGS: CTA CHEST FINDINGS Cardiovascular: There is no demonstrable pulmonary embolus. There is no thoracic aortic aneurysm or dissection. There are foci of calcification in the proximal visualized great vessels. There is atherosclerotic calcification in the thoracic aorta. There are scattered foci of coronary artery calcification. There is a small pericardial effusion. Mediastinum/Nodes: There is a partially calcified mass arising from the right lobe of the thyroid measuring 2.7 x 1.9 cm. Thyroid otherwise appears unremarkable. There is extensive adenopathy throughout the chest. There are multiple supraclavicular lymph nodes, largest measuring 1.0 x 0.9 cm. There are multiple axillary lymph nodes. The largest axillary lymph node on the left measures 2.5 x 2.1 cm cm. The largest axillary lymph node on the right measures 2.3 x 2.0 cm. There is adenopathy throughout the mediastinum. There are multiple aortopulmonary window region lymph nodes, largest measuring 1.4 x 1.4 cm. There is extensive adenopathy in the mediastinum surrounding the trachea and carina. There is a right pretracheal lymph node measuring 1.9 x 1.8 cm. There is a lymph node to the left of the origin of the carina measuring 1.6 x 1.5 cm. There is extensive adenopathy in the right hilar region. The largest individual lymph node in the right hilum measures 2.2 x 1.4 cm. There are multiple left hilar lymph nodes. The largest left hilar lymph node measures 1.8 x 1.3 cm. There is extensive subcarinal adenopathy. Largest subcarinal lymph node measures 2.8 x 2.4 cm. There are retrocrural lymph nodes bilaterally, largest on the left measuring 1.3 x 1.0  cm. There are several small lymph nodes at the level of the right pericardiophrenic angle. No esophageal lesions are evident. Lungs/Pleura: There are free-flowing pleural effusions bilaterally, larger on the right than on the left. There is  consolidation in both lung bases, likely primarily due to compressive atelectasis. There is atelectatic change with volume loss in the right middle lobe medially with focal consolidation adjacent to the right heart border in the medial segment right middle lobe. No pulmonary nodular type lesion is evident on this study. Musculoskeletal: There are no blastic or lytic bone lesions. No chest wall lesions are evident. Several benign-appearing calcifications are noted in the right breast. Review of the MIP images confirms the above findings. CT ABDOMEN and PELVIS FINDINGS Hepatobiliary: No focal liver lesions are appreciable. There is no appreciable gallbladder wall thickening. No biliary duct dilatation. Pancreas: No pancreatic mass or inflammatory focus. Extensive peripancreatic adenopathy is noted. Spleen: Spleen measures 15.0 x 12.4 x 6.8 cm with a measured splenic volume of 632 cubic cm. No focal splenic lesions are evident. Adrenals/Urinary Tract: Adrenals bilaterally appear unremarkable. Kidneys bilaterally show no evident mass or hydronephrosis. No renal or ureteral calculus. No hydronephrosis. Urinary bladder is midline with thickening of the urinary bladder wall. There is also mild soft tissue stranding surrounding the urinary bladder. Stomach/Bowel: There is no appreciable bowel wall or mesenteric thickening. There is no evident bowel obstruction. No free air or portal venous air. Vascular/Lymphatic: There is atherosclerotic calcification throughout the aorta and iliac arteries. There is moderate narrowing of the aorta with what appears to be hemodynamically significant obstruction in both common femoral arteries. No aneurysm. Major mesenteric arterial vessels appear patent. There is extensive adenopathy throughout the abdomen. There are multiple enlarged lymph nodes in the peripancreatic region, largest measuring 1.7 x 1.5 cm. Scattered prominent mesenteric lymph nodes are noted throughout the abdomen. There  is extensive retroperitoneal adenopathy. Largest retroperitoneal lymph node measures 2.5 x 1.5 cm. There is adenopathy to the right of the celiac axis measuring 2.3 x 1.5 cm. Adenopathy is seen in both external iliac node chains. The largest lymph node in this area on the right measures 2.6 x 1.7 cm. The largest lymph node in this region on the left measures 2.0 by 1.4 cm. There is also extensive adenopathy more posteriorly in the pelvis at the level of each acetabulum. The largest of these lymph nodes on the right measures 3.1 x 1.7 cm. Largest of these lymph nodes on the left measures 3.2 x 1.6 cm. There is adenopathy in both inguinal regions. Largest of these lymph nodes in the inguinal regions on the right measures 1.4 x 1.3 cm. Largest lymph node in the left inguinal region measures 1.8 x 1.6 cm. Reproductive: Uterus is apparently absent. No pelvic mass apart from adenopathy seen. Other: Appendix region appears normal. No abscess is evident in the abdomen or pelvis. There is mild ascites. Musculoskeletal: There are no appreciable blastic or lytic bone lesions. No intramuscular or abdominal wall lesions are evident. Review of the MIP images confirms the above findings. IMPRESSION: CT angiogram chest: 1. No demonstrable pulmonary embolus. No thoracic aortic aneurysm or dissection. Multiple foci of aortic atherosclerosis noted as well as foci of calcification in great vessels and coronary arteries. 2. Extensive adenopathy at multiple sites as summarized above. This extensive adenopathy raises concern for potential lymphoma. Small cell lung carcinoma could present in this manner as well. 3. Moderate pleural effusions bilaterally, larger on the right than on the left, with compressive  atelectasis in the lung bases. There is focal consolidation felt to represent pneumonia adjacent to the right heart border in the medial segment right middle lobe. 4. Partially calcified dominant mass right lobe of thyroid. This mass  may warrant nonemergent thyroid ultrasound to further evaluate. CT abdomen and pelvis: 1. Widespread abdominal and pelvic adenopathy. Splenomegaly. This combination of findings raises concern for lymphoma as most likely etiology. 2.  Mild ascites. 3. No abscess in the abdomen or pelvis. No periappendiceal region inflammation. 4. Extent extensive aortoiliac atherosclerosis with what appears to be hemodynamically significant obstruction in the common iliac arteries bilaterally. 5.  Uterus apparently absent. Aortic Atherosclerosis (ICD10-I70.0). Electronically Signed: By: Lowella Grip III M.D. On: 04/18/2018 08:41   Ct Bone Marrow Biopsy & Aspiration  Result Date: 05/05/2018 INDICATION: Lymphoma EXAM: CT BONE MARROW BIOPSY AND ASPIRATION MEDICATIONS: None. ANESTHESIA/SEDATION: Fentanyl 75 mcg IV; Versed 2 mg IV Moderate Sedation Time:  32 minutes The patient was continuously monitored during the procedure by the interventional radiology nurse under my direct supervision. FLUOROSCOPY TIME:  Fluoroscopy Time:  minutes  seconds ( mGy). COMPLICATIONS: None immediate. PROCEDURE: Informed written consent was obtained from the patient after a thorough discussion of the procedural risks, benefits and alternatives. All questions were addressed. Maximal Sterile Barrier Technique was utilized including caps, mask, sterile gowns, sterile gloves, sterile drape, hand hygiene and skin antiseptic. A timeout was performed prior to the initiation of the procedure. Under CT guidance, a(n) 11 gauge guide needle was advanced into the right iliac bone. Aspirates and a core were obtained. Post biopsy images demonstrate . Patient tolerated the procedure well without complication. Vital sign monitoring by nursing staff during the procedure will continue as patient is in the special procedures unit for post procedure observation. FINDINGS: The images document guide needle placement within the right iliac bone. Post biopsy images  demonstrate no hemorrhage. IMPRESSION: Successful CT-guided right iliac bone marrow aspirate and core. Electronically Signed   By: Marybelle Killings M.D.   On: 05/05/2018 13:33   Ir Imaging Guided Port Insertion  Result Date: 05/07/2018 INDICATION: 62 year old female with a history of lymphoma. She has been referred for port catheter. EXAM: IMPLANTED PORT A CATH PLACEMENT WITH ULTRASOUND AND FLUOROSCOPIC GUIDANCE MEDICATIONS: 2 g Ancef; The antibiotic was administered within an appropriate time interval prior to skin puncture. ANESTHESIA/SEDATION: Moderate (conscious) sedation was employed during this procedure. A total of Versed mg and Fentanyl 100 mcg was administered intravenously. Moderate Sedation Time: 20 minutes. The patient's level of consciousness and vital signs were monitored continuously by radiology nursing throughout the procedure under my direct supervision. FLUOROSCOPY TIME:  0 minutes, 6 seconds (1.5 mGy) COMPLICATIONS: None PROCEDURE: The procedure, risks, benefits, and alternatives were explained to the patient. Questions regarding the procedure were encouraged and answered. The patient understands and consents to the procedure. Ultrasound survey was performed with images stored and sent to PACs. The right neck and chest was prepped with chlorhexidine, and draped in the usual sterile fashion using maximum barrier technique (cap and mask, sterile gown, sterile gloves, large sterile sheet, hand hygiene and cutaneous antiseptic). Antibiotic prophylaxis was provided with 2.0g Ancef administered IV one hour prior to skin incision. Local anesthesia was attained by infiltration with 1% lidocaine without epinephrine. Ultrasound demonstrated patency of the right internal jugular vein, and this was documented with an image. Under real-time ultrasound guidance, this vein was accessed with a 21 gauge micropuncture needle and image documentation was performed. A small dermatotomy was made at the  access site  with an 11 scalpel. A 0.018" wire was advanced into the SVC and used to estimate the length of the internal catheter. The access needle exchanged for a 7F micropuncture vascular sheath. The 0.018" wire was then removed and a 0.035" wire advanced into the IVC. An appropriate location for the subcutaneous reservoir was selected below the clavicle and an incision was made through the skin and underlying soft tissues. The subcutaneous tissues were then dissected using a combination of blunt and sharp surgical technique and a pocket was formed. A single lumen power injectable portacatheter was then tunneled through the subcutaneous tissues from the pocket to the dermatotomy and the port reservoir placed within the subcutaneous pocket. The venous access site was then serially dilated and a peel away vascular sheath placed over the wire. The wire was removed and the port catheter advanced into position under fluoroscopic guidance. The catheter tip is positioned in the cavoatrial junction. This was documented with a spot image. The portacatheter was then tested and found to flush and aspirate well. The port was flushed with saline and Huber needle was left in place at the completion. The pocket was then closed in two layers using first subdermal inverted interrupted absorbable sutures followed by a running subcuticular suture. The epidermis was then sealed with Dermabond. The dermatotomy at the venous access site was also seal with Dermabond and Steri-Strips. Patient tolerated the procedure well and remained hemodynamically stable throughout. No complications encountered and no significant blood loss encountered . IMPRESSION: Status post right IJ port catheter placement. Catheter ready for use. Signed, Dulcy Fanny. Dellia Nims, RPVI [facility] Electronically Signed   By: Corrie Mckusick D.O.   On: 05/07/2018 17:19    ASSESSMENT & PLAN:  62 y.o. 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 2. General LNadenopathy from lymphoma with b/l pleural effusion and b/l lower extremity weakness  3. Anemia/thrombocytopenia -  from Bone marrow involvement by lymphoma BM Bx confirms involved by T cell lymphoma. Hgb upto 9.6 post transfusion with patient feeling much better.  4. 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 LDH wnl no evidnece of overt hemolysis at this timne   5. CMV IgM+ -- will ceck CMV PCR quantitative (lab pending)  6. High risk for TLS - on allopurinol , received Rasburicase. Uric acid improving down to 3.8 Uric acid 16--->8--> 4.6  7. Port a cath in situ  8. 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 -daily cbc/diff/platelets, cmp, uric acid -nutritional consultation to optimize po intake -- will consider switching to Brentuximab + CHP as outpatient from C2. -granix daily for 5 days starting 24h after chemotherapy -ordered to start today. -CXR 6/21 reviewed-- would recommended therapeutic thoracentesis on rt perhaps on Monday -diuretics for 3rd spacing per hospitalist -will need outpatient PET/CT for baseline and response to treatment assessment. -PT/OT evaluation. -Continue eating well and emphasized that protein intake will help with fluid collection   I spent 25 minutes counseling the patient face to face. The total time spent in the appointment was 60mnutes and more than 50% was on counseling and direct patient cares.    GSullivan LoneMD MPembineAAHIVMS SE[facility] BiltmoreCChristus Schumpert Medical CenterHematology/Oncology Physician CWestpark Springs (Office):       [phone](Work cell):  [phone](Fax):  [phone]  05/10/2018 10:17 AM

## 2018-05-11 ENCOUNTER — Inpatient Hospital Stay (HOSPITAL_COMMUNITY): Payer: Self-pay

## 2018-05-11 ENCOUNTER — Inpatient Hospital Stay (HOSPITAL_COMMUNITY): Payer: Self-pay | Admitting: Anesthesiology

## 2018-05-11 DIAGNOSIS — J690 Pneumonitis due to inhalation of food and vomit: Secondary | ICD-10-CM

## 2018-05-11 DIAGNOSIS — R768 Other specified abnormal immunological findings in serum: Secondary | ICD-10-CM

## 2018-05-11 LAB — BLOOD GAS, ARTERIAL
ACID-BASE DEFICIT: 3.1 mmol/L — AB (ref 0.0–2.0)
Bicarbonate: 26 mmol/L (ref 20.0–28.0)
DRAWN BY: 103701
FIO2: 100
MECHVT: 400 mL
O2 Saturation: 96.7 %
PATIENT TEMPERATURE: 98.6
PEEP/CPAP: 5 cmH2O
PH ART: 7.199 — AB (ref 7.350–7.450)
RATE: 14 resp/min
pCO2 arterial: 69.3 mmHg (ref 32.0–48.0)
pO2, Arterial: 112 mmHg — ABNORMAL HIGH (ref 83.0–108.0)

## 2018-05-11 LAB — BASIC METABOLIC PANEL
Anion gap: 3 — ABNORMAL LOW (ref 5–15)
BUN: 22 mg/dL — AB (ref 6–20)
CALCIUM: 8.5 mg/dL — AB (ref 8.9–10.3)
CO2: 30 mmol/L (ref 22–32)
CREATININE: 0.53 mg/dL (ref 0.44–1.00)
Chloride: 106 mmol/L (ref 101–111)
GFR calc non Af Amer: >60 mL/min (ref >60)
Glucose, Bld: 123 mg/dL — ABNORMAL HIGH (ref 65–99)
Potassium: 3.9 mmol/L (ref 3.5–5.1)
Sodium: 139 mmol/L (ref 135–145)

## 2018-05-11 LAB — TRIGLYCERIDES: TRIGLYCERIDES: 247 mg/dL — AB (ref <150)

## 2018-05-11 LAB — CBC
HCT: 26.8 % — ABNORMAL LOW (ref 36.0–46.0)
Hemoglobin: 8.9 g/dL — ABNORMAL LOW (ref 12.0–15.0)
MCH: 29.9 pg (ref 26.0–34.0)
MCHC: 33.2 g/dL (ref 30.0–36.0)
MCV: 89.9 fL (ref 78.0–100.0)
PLATELETS: 99 10*3/uL — AB (ref 150–400)
RBC: 2.98 MIL/uL — ABNORMAL LOW (ref 3.87–5.11)
RDW: 16.2 % — AB (ref 11.5–15.5)
WBC: 9.5 10*3/uL (ref 4.0–10.5)

## 2018-05-11 LAB — GLUCOSE, CAPILLARY
GLUCOSE-CAPILLARY: 134 mg/dL — AB (ref 65–99)
GLUCOSE-CAPILLARY: 209 mg/dL — AB (ref 65–99)
GLUCOSE-CAPILLARY: 178 mg/dL — AB (ref 65–99)
Glucose-Capillary: 272 mg/dL — ABNORMAL HIGH (ref 65–99)
Glucose-Capillary: 129 mg/dL — ABNORMAL HIGH (ref 65–99)

## 2018-05-11 LAB — MRSA PCR SCREENING: MRSA BY PCR: NEGATIVE

## 2018-05-11 MED ORDER — MIDAZOLAM HCL 2 MG/2ML IJ SOLN
INTRAMUSCULAR | Status: AC
  Filled 2018-05-11: qty 4

## 2018-05-11 MED ORDER — PREDNISONE 20 MG PO TABS
60.0000 mg | ORAL_TABLET | Freq: Every day | ORAL | Status: AC
  Administered 2018-05-12: 60 mg
  Filled 2018-05-11: qty 3

## 2018-05-11 MED ORDER — SODIUM CHLORIDE 0.9 % IV BOLUS
1000.0000 mL | Freq: Once | INTRAVENOUS | Status: AC
  Administered 2018-05-11: 1000 mL via INTRAVENOUS

## 2018-05-11 MED ORDER — FENTANYL CITRATE (PF) 100 MCG/2ML IJ SOLN
INTRAMUSCULAR | Status: AC
  Filled 2018-05-11: qty 2

## 2018-05-11 MED ORDER — ENOXAPARIN SODIUM 40 MG/0.4ML ~~LOC~~ SOLN
40.0000 mg | SUBCUTANEOUS | Status: DC
  Administered 2018-05-11 – 2018-05-13 (×3): 40 mg via SUBCUTANEOUS
  Filled 2018-05-11 (×3): qty 0.4

## 2018-05-11 MED ORDER — FENTANYL CITRATE (PF) 100 MCG/2ML IJ SOLN
100.0000 ug | INTRAMUSCULAR | Status: DC | PRN
  Administered 2018-05-11 (×2): 100 ug via INTRAVENOUS
  Filled 2018-05-11: qty 2

## 2018-05-11 MED ORDER — FENTANYL CITRATE (PF) 100 MCG/2ML IJ SOLN
100.0000 ug | INTRAMUSCULAR | Status: DC | PRN
  Administered 2018-05-11: 100 ug via INTRAVENOUS

## 2018-05-11 MED ORDER — CHLORHEXIDINE GLUCONATE 0.12% ORAL RINSE (MEDLINE KIT)
15.0000 mL | Freq: Two times a day (BID) | OROMUCOSAL | Status: DC
  Administered 2018-05-11 – 2018-05-13 (×4): 15 mL via OROMUCOSAL

## 2018-05-11 MED ORDER — ETOMIDATE 2 MG/ML IV SOLN
INTRAVENOUS | Status: DC | PRN
  Administered 2018-05-11: 16 mg via INTRAVENOUS

## 2018-05-11 MED ORDER — LIDOCAINE-EPINEPHRINE (PF) 2 %-1:200000 IJ SOLN
INTRAMUSCULAR | Status: AC
  Filled 2018-05-11: qty 20

## 2018-05-11 MED ORDER — SODIUM CHLORIDE 0.9 % IV SOLN
3.0000 g | Freq: Four times a day (QID) | INTRAVENOUS | Status: DC
  Administered 2018-05-11 – 2018-05-13 (×9): 3 g via INTRAVENOUS
  Filled 2018-05-11 (×17): qty 3

## 2018-05-11 MED ORDER — SUCCINYLCHOLINE CHLORIDE 20 MG/ML IJ SOLN
INTRAMUSCULAR | Status: DC | PRN
  Administered 2018-05-11: 120 mg via INTRAVENOUS

## 2018-05-11 MED ORDER — PROPOFOL 1000 MG/100ML IV EMUL: 5.0000 ug/kg/min | INTRAVENOUS | Status: DC

## 2018-05-11 MED ORDER — SODIUM CHLORIDE 0.9% FLUSH
10.0000 mL | Freq: Two times a day (BID) | INTRAVENOUS | Status: DC
  Administered 2018-05-11 – 2018-05-15 (×5): 10 mL
  Administered 2018-05-15: 20 mL
  Administered 2018-05-16 – 2018-05-18 (×6): 10 mL

## 2018-05-11 MED ORDER — IPRATROPIUM-ALBUTEROL 0.5-2.5 (3) MG/3ML IN SOLN
3.0000 mL | Freq: Four times a day (QID) | RESPIRATORY_TRACT | Status: DC
  Administered 2018-05-11 – 2018-05-13 (×8): 3 mL via RESPIRATORY_TRACT
  Filled 2018-05-11 (×8): qty 3

## 2018-05-11 MED ORDER — INSULIN ASPART 100 UNIT/ML ~~LOC~~ SOLN
0.0000 [IU] | SUBCUTANEOUS | Status: DC
  Administered 2018-05-11: 11 [IU] via SUBCUTANEOUS
  Administered 2018-05-11: 3 [IU] via SUBCUTANEOUS
  Administered 2018-05-11: 7 [IU] via SUBCUTANEOUS
  Administered 2018-05-11 – 2018-05-12 (×3): 4 [IU] via SUBCUTANEOUS
  Administered 2018-05-12 – 2018-05-13 (×4): 3 [IU] via SUBCUTANEOUS

## 2018-05-11 MED ORDER — FUROSEMIDE 20 MG PO TABS: 20.0000 mg | ORAL_TABLET | Freq: Every day | ORAL | Status: DC

## 2018-05-11 MED ORDER — ALLOPURINOL 100 MG PO TABS
300.0000 mg | ORAL_TABLET | Freq: Every day | ORAL | Status: DC
  Administered 2018-05-12: 300 mg
  Filled 2018-05-11: qty 3

## 2018-05-11 MED ORDER — PROPOFOL 1000 MG/100ML IV EMUL
0.0000 ug/kg/min | INTRAVENOUS | Status: DC
  Administered 2018-05-11: 10 ug/kg/min via INTRAVENOUS
  Administered 2018-05-12: 5.028 ug/kg/min via INTRAVENOUS
  Administered 2018-05-12 – 2018-05-13 (×2): 5 ug/kg/min via INTRAVENOUS
  Filled 2018-05-11 (×2): qty 100

## 2018-05-11 MED ORDER — CHLORHEXIDINE GLUCONATE CLOTH 2 % EX PADS
6.0000 | MEDICATED_PAD | Freq: Every day | CUTANEOUS | Status: DC
  Administered 2018-05-11 – 2018-05-18 (×4): 6 via TOPICAL

## 2018-05-11 MED ORDER — SODIUM CHLORIDE 0.9 % IV SOLN: INTRAVENOUS | Status: DC | PRN

## 2018-05-11 MED ORDER — SODIUM CHLORIDE 0.9% FLUSH: 10.0000 mL | INTRAVENOUS | Status: DC | PRN

## 2018-05-11 MED ORDER — PROPOFOL 1000 MG/100ML IV EMUL
INTRAVENOUS | Status: AC
  Filled 2018-05-11: qty 100

## 2018-05-11 MED ORDER — PANTOPRAZOLE SODIUM 40 MG PO PACK
40.0000 mg | PACK | Freq: Every day | ORAL | Status: DC
  Administered 2018-05-11 – 2018-05-12 (×2): 40 mg
  Filled 2018-05-11 (×2): qty 20

## 2018-05-11 MED ORDER — ORAL CARE MOUTH RINSE
15.0000 mL | OROMUCOSAL | Status: DC
  Administered 2018-05-11 – 2018-05-13 (×14): 15 mL via OROMUCOSAL

## 2018-05-11 MED ORDER — SODIUM CHLORIDE 0.9 % IV BOLUS: 1000.0000 mL | Freq: Once | INTRAVENOUS | Status: DC

## 2018-05-11 NOTE — Consult Note (Signed)
PULMONARY / CRITICAL CARE MEDICINE   Name: Deborah Cooley MRN: [id number] DOB: [birth year]    ADMISSION DATE:  04/23/2018 CONSULTATION DATE:  05/11/18  REFERRING MD:  Dr Zigmund Daniel, [facility]  Reason for Consultation:  Acute respiratory failure   HISTORY OF PRESENT ILLNESS:   62 year old woman, history of tobacco, endometriosis, admitted with lymphadenopathy, abdominal discomfort and cough.  Found to have bilateral pleural effusions and was treated for possible community acquired pneumonia.  Right thoracentesis 5/31 showed an atypical lymphocytosis.  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  Her bone marrow biopsy 6/17 confirmed marrow involvement.  Received first cycle of CHOP on 6/20, still completing the prednisone component.  She went for an ultrasound-guided right thoracentesis 6/23, 1.7 L removed.  This was initially well-tolerated with no evidence of pneumothorax.  She experienced an episode of acute dyspnea, respiratory decompensation with hypoxemia following emesis later 6/23.  She moved emergently to the ICU and was ET intubated for acute respiratory failure.  Repeat chest x-ray shows interval right basilar consolidative infiltrate most consistent with an aspiration pneumonia/pneumonitis.  PAST MEDICAL HISTORY :  She  has a past medical history of Bilateral pleural effusion (04/17/2018), Endometriosis, Essential hypertension, benign, Family history of adverse reaction to anesthesia, Heart murmur, Mixed hyperlipidemia, and Pneumonia (04/17/2018).  PAST SURGICAL HISTORY: She  has a past surgical history that includes Arthroscopic right shoulder surgery; Right hand surgery; Vesicovaginal fistula closure w/ TAH; Partial hysterectomy (1984); Oophorectomy (1997); Multiple tooth extractions; Carpal tunnel release (Right); Ganglion cyst excision (Right); Shoulder open rotator cuff repair (Right); Abdominal hysterectomy (1970s/1980s);  Axillary lymph node biopsy (Left, 04/21/2018); and IR IMAGING GUIDED PORT INSERTION (05/07/2018).  No Known Allergies  No current facility-administered medications on file prior to encounter.    No current outpatient medications on file prior to encounter.    FAMILY HISTORY:  Her indicated that her mother is alive. She indicated that her father is deceased. She indicated that the status of her other is unknown. She indicated that the status of her unknown relative is unknown.   SOCIAL HISTORY: She  reports that she has been smoking cigarettes.  She has a 69.00 pack-year smoking history. She has never used smokeless tobacco. She reports that she drank alcohol. She reports that she has current or past drug history.  REVIEW OF SYSTEMS:   Unable to obtain  SUBJECTIVE:  Urgently intubated by Dr. Gifford Shave with anesthesiology Awake and agitated post intubation, chest x-ray pending   VITAL SIGNS: BP (!) 153/61   Pulse 94   Temp 98.4 F (36.9 C) (Oral)   Resp (!) 21   Ht 5' 1.81" (1.57 m)   Wt 66.3 kg (146 lb 2.6 oz)   SpO2 99%   BMI 26.90 kg/m   HEMODYNAMICS:    VENTILATOR SETTINGS:    INTAKE / OUTPUT: I/O last 3 completed shifts: In: 1080 [P.O.:1080] Out: -   PHYSICAL EXAMINATION: General: Ill-appearing elderly woman on mechanical ventilation Neuro: Awake, moving her upper extremities and trying to grab her endotracheal tube, redirectable HEENT: ET tube in place, pupils equal Cardiovascular: Tachycardic, regular without a murmur, 1-2+ lower extremity edema Lungs: Coarse bilaterally with crackles noted on the right Abdomen: Soft, nontender, positive bowel sounds Musculoskeletal: No deformity Skin: Some excoriations on her legs but no rash  LABS:  BMET Recent Labs  Lab 05/09/18 0650 05/10/18 0952 05/11/18 0500  NA 139 138 139  K 4.4 3.9 3.9  CL 107 107 106  CO2 29  27 30  BUN 25* 24* 22*  CREATININE 0.68 0.56 0.53  GLUCOSE 142* 234* 123*     Electrolytes Recent Labs  Lab 05/07/18 0408  05/09/18 0650 05/10/18 0952 05/11/18 0500  CALCIUM 8.3*   < > 8.0* 8.1* 8.5*  MG 2.3  --   --  1.9  --   PHOS 3.6  --   --  3.1  --    < > = values in this interval not displayed.    CBC Recent Labs  Lab 05/09/18 0650 05/10/18 0952 05/11/18 0500  WBC 4.2 3.0* 9.5  HGB 9.6* 8.9* 8.9*  HCT 29.1* 27.3* 26.8*  PLT 112* 106* 99*    Coag's No results for input(s): APTT, INR in the last 168 hours.  Sepsis Markers No results for input(s): LATICACIDVEN, PROCALCITON, O2SATVEN in the last 168 hours.  ABG No results for input(s): PHART, PCO2ART, PO2ART in the last 168 hours.  Liver Enzymes Recent Labs  Lab 05/07/18 0408 05/09/18 0650 05/10/18 0952  AST _0 ALT _1 ALKPHOS 52 46 48  BILITOT 0.6 0.4 0.4  ALBUMIN 2.8* 2.7* 2.6*    Cardiac Enzymes No results for input(s): TROPONINI, PROBNP in the last 168 hours.  Glucose Recent Labs  Lab 05/09/18 2110 05/10/18 0740 05/10/18 1149 05/10/18 1700 05/10/18 2001 05/11/18 0808  GLUCAP 221* 130* 211* 222* 252* 134*    Imaging Dg Chest 1 View  Result Date: 05/11/2018 CLINICAL DATA:  Post thoracentesis now with worsening cough, shortness of breath and vomiting. EXAM: CHEST  1 VIEW COMPARISON:  Earlier same day; 05/09/2018; 05/08/2018 FINDINGS: Interval development of recurrent right basilar consolidative suspected airspace opacities now with partial obscuration of the right heart border. Otherwise, grossly unchanged cardiac silhouette and mediastinal contours with atherosclerotic plaque when the thoracic aorta. Unchanged small to moderate size left-sided effusion associated left basilar consolidative opacities. Pulmonary vasculature remains distinct with cephalization of flow. No pneumothorax. Stable position of support apparatus. Known peripherally calcified thyroid nodule overlies the thoracic inlet. No acute osseus abnormalities. IMPRESSION: 1. Interval  development of recurrent right basilar consolidative airspace opacities post recent large volume right-sided thoracentesis - differential considerations are broad though given provided history of vomiting, findings are concerning for aspiration with additional differential considerations including re-expansion alveolar pulmonary edema and (less likely) pulmonary hemorrhage. 2. Grossly unchanged small to moderate size left-sided effusion. 3. Persistent findings of pulmonary venous congestion/mild pulmonary edema. Above findings discussed with rapid response team at the time of image acquisition. Electronically Signed   By: Sandi Mariscal M.D.   On: 05/11/2018 11:50   Dg Chest 1 View  Result Date: 05/11/2018 CLINICAL DATA:  Post right-sided thoracentesis EXAM: CHEST  1 VIEW COMPARISON:  05/09/2018 FINDINGS: Interval reduction/near resolution of residual trace right-sided effusion post thoracentesis. No pneumothorax. No change to slight increase in small to moderate size left-sided effusion. Improved aeration of lung base with persistent right basilar heterogeneous opacities. Unchanged left basilar consolidative opacities. Pulmonary vasculature remains indistinct with cephalization of flow and nodular thickening of the pulmonary interstitium. Grossly unchanged cardiac silhouette and mediastinal contours with atherosclerotic plaque within the thoracic aorta. Calcification overlying the right-sided the thoracic inlet is unchanged and compatible with known peripherally calcified thyroid nodule. Stable position of support apparatus. No acute osseus abnormalities. IMPRESSION: 1. Interval reduction/near resolution of residual trace effusion post thoracentesis. No pneumothorax. 2. No change to slight increase in small to moderate size left-sided pleural effusion. 3. Overall improved aeration of lungs with  persistent findings of pulmonary venous congestion/mild pulmonary edema. Electronically Signed   By: Sandi Mariscal M.D.    On: 05/11/2018 10:44   US Thoracentesis Asp Pleural Space W/img Guide  Result Date: 05/11/2018 INDICATION: Symptomatic right sided pleural effusion. Please perform ultrasound-guided thoracentesis for therapeutic purposes. EXAM: US THORACENTESIS ASP PLEURAL SPACE W/IMG GUIDE COMPARISON:  Chest CT-04/18/2018; chest radiograph-05/09/2018 MEDICATIONS: None. COMPLICATIONS: None immediate. TECHNIQUE: Informed written consent was obtained from the patient after a discussion of the risks, benefits and alternatives to treatment. A timeout was performed prior to the initiation of the procedure. Initial ultrasound scanning demonstrates a large anechoic right-sided pleural effusion. The lower chest was prepped and draped in the usual sterile fashion. 1% lidocaine was used for local anesthesia. An ultrasound image was saved for documentation purposes. An 8 Fr Safe-T-Centesis catheter was introduced. The thoracentesis was performed. The catheter was removed and a dressing was applied. The patient tolerated the procedure well without immediate post procedural complication. The patient was escorted to have an upright chest radiograph. FINDINGS: A total of approximately 1.7 liters of serous fluid was removed. IMPRESSION: Successful ultrasound-guided right sided thoracentesis yielding 1.7 liters of pleural fluid. Electronically Signed   By: Sandi Mariscal M.D.   On: 05/11/2018 10:45     STUDIES:  R thoracentesis 5/31 >> atypical lymphocytosis noted Excisional biopsy left axillary lymph node 6/3 >> angioimmunoblastic T-cell lymphoma, pattern 1 Bone marrow biopsy 6/17 >> T-cell lymphoma   CULTURES: Blood 6/23 >>  resp 6/23 >>   ANTIBIOTICS: Unasyn 6/23 >>   SIGNIFICANT EVENTS: Acute decompensation following emesis and apparent aspiration 6/23  LINES/TUBES: Port-A-Cath placement 6/19 >>   DISCUSSION: 62 year old woman with newly diagnosed malignant T-cell lymphoma with right pleural involvement.  Experienced  acute respiratory failure and overall decompensation following emesis and apparent aspiration event.  Consider also reexpansion pulmonary edema or hemorrhage post right thoracentesis but feel that this is unlikely.  ASSESSMENT / PLAN:  PULMONARY A: Acute respiratory failure Probable right lower lobe aspiration pneumonia Consider right lung reexpansion pulmonary edema or hemorrhage post right thoracentesis.  Based on the clinical history suspect this is unlikely Malignant right pleural effusion, lymphoma History of tobacco and presumed COPD P:   Infiltrate appears to be focal in the right lower lobe so use PRVC 8 cc/kg.  If she develops bilateral infiltrates consistent with ARDS then will decrease goal tidal volumes to 6 cc/kg. Increase scheduled bronchodilators to every 6 hours Antibiotics as below, obtain respiratory cultures  CARDIOVASCULAR A:  No acute issues P:  Telemetry, follow hemodynamics at high risk sepsis  RENAL A:   No Acute issues P:   Follow urine output, BMP Avoid nephrotoxins Ensure adequate renal perfusion Hold off diuresis for now until insure hemodynamic stability  GASTROINTESTINAL A:   SUP  P:   Pantoprazole per tube  HEMATOLOGIC / ONCOLOGIC  A:   Malignant angioimmunoblastic T-cell lymphoma, bone marrow and right pleural involvement DVT prophylaxis P:  Finish final doses of prednisone as part of first cycle CHOP Finish filgrastim regimen, 5 days Allopurinol as ordered Enoxaparin for prophylaxis Follow CBC  INFECTIOUS A:   Probable aspiration PNA RLL P:   Unasyn started 6/23 resp and blood cx ordered.   ENDOCRINE A:   Hyperglycemia P:   SSI per protocol, resistant scale on steroids  NEUROLOGIC A:   Sedation to facilitate mechanical ventilation P:   RASS goal: -1 to -2 Sedation protocol initiated with propofol and as needed fentanyl D/c rozerem, dilaudid  FAMILY  - Updates: Husband, son, extended family updated at bedside  6/23  - Inter-disciplinary family meet or Palliative Care meeting due by: 05/18/18  Independent CC time 108 minutes  Baltazar Apo, MD, PhD 05/11/2018, 1:21 PM [place] [facility] [phone] or if no answer [phone]

## 2018-05-11 NOTE — Significant Event (Addendum)
Rapid Response Event Note  Overview: Time Called: 5003 Arrival Time: 7048 Event Type: Respiratory  Notified by bedside RN Cindy in regards to patient's respiratory status declining. Upon entering the room, the patient was actively vomiting and desaturating into mid 80s on a nasal canula at 5 Liters. Patient was in respiratory distress.   Initial Focused Assessment: Neuro: Patient able to follow basic commands, PERRLA, 1-2+. Oriented x 4 at this time. Cardiac: NSR-ST, S1 and S2 heard upon auscultation, 1+ pulses in radial and pedal locations.  Pulmonary: Patient just arrived back to room from thoracentesis, 1.7Liters removed, Chest x-ray completed before rapid response arrival to room. Patient's breath sounds in all lung fields were rhonchus and diminished, RR 40-50s, O2 Sats desaturating on [place] 5Liters, placed patient on NRB.   Interventions: Told bedside RN to page attending Notified Respiratory Therapy  Immediate trx to ICU  -Patient began to decompensate further, notified Anesthesia for bedside intubation.  -Patient not able to maintain airway, provided airway assistance with ambu-bag  - MD Zigmund Daniel at bedside, Anesthesia at bedside to intubate Patient intubated  CCM consulted Plan of Care (if not transferred):  Event Summary:   at      at          [place]

## 2018-05-11 NOTE — Anesthesia Procedure Notes (Signed)
Procedure Name: Intubation Date/Time: 05/11/2018 12:03 PM Performed by: Catalina Gravel, MD Pre-anesthesia Checklist: Patient identified, Emergency Drugs available, Suction available and Patient being monitored Patient Re-evaluated:Patient Re-evaluated prior to induction Oxygen Delivery Method: Ambu bag Preoxygenation: Pre-oxygenation with 100% oxygen Induction Type: IV induction and Rapid sequence Ventilation: Mask ventilation without difficulty Laryngoscope Size: Glidescope and 3 Tube type: Subglottic suction tube Tube size: 7.5 mm Number of attempts: 1 Airway Equipment and Method: Video-laryngoscopy and Stylet Placement Confirmation: positive ETCO2 and breath sounds checked- equal and bilateral Secured at: 22 cm Tube secured with: Tape Dental Injury: Teeth and Oropharynx as per pre-operative assessment  Comments: +Vomitus in oropharynx and ETT once placed into trachea.

## 2018-05-11 NOTE — Progress Notes (Signed)
PROGRESS NOTE    Deborah Cooley  WGY:[id number] DOB: 06-Aug-1956 DOA: 04/23/2018 PCP: Patient, No Pcp Per  Brief Narrative:62 yo with PMH of HTN, endometriosis s/p hysterectomy, tobacco use and recent hospitalization for multifocal lymphadenopathy and concern for lymphoma who presents for evaluation of recurrent shortness of breath and abdominal pain. History taken directly from the patient. Patient states that since discharge from hospital 3 days ago she has felt ongoing shortness of breath and swelling/pain in abdomen. Patient states both of these symptoms were present during previous hospitalization. She had some mild relief of symptoms with procedure done to drain fluid from her lungs while in hospital, but symptoms quickly returned once she got home. Since the day of discharge patient has experienced difficulty ambulating 2/2 shortness of breath and difficulty sleeping. Patient notes symptoms worse when laying flat and it feels as if she will drown/choke on her fluid at night. Because of this patient hasn't really slept since leaving the hospital. Patient thinks that her abdominal swelling noted last admission has gotten worse and she now has pain, described as increased fullness/pressure, in her upper abdomen whenever she takes in a deep breath. No fevers, difficulties with urination, bowel movements, headaches, or nausea/vomiting.   Upon arrival to the ED patient was afebrile, intermittently tachycardic, normotensive, and saturating >94% on room air. Patient's CBC with Hgb 10.9, WBC 7.7, platelets 178. BMP within normal limits. BNP = 78. ISTAT lactic acid elevated to 2.43, repeat 1.69. EKG with sinus tachycardia, but no acute changes concerning for ACS. ISTAT troponin within normal limits. Patient had CXR which showed increased bilateral pleural effusions, diffuse interstitial prominence, patchy airspace opacity in RML, and LLL atelectasis. Patient had ongoing SOB in ED and IMTS called for  admission.    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  Oncology notes reviewed.  Patient to get Granix  starting today.  She was started on steroids allopurinol and rasburicase for prophylaxis of tumor lysis syndrome.  Daily CBC,uric acid and CMP.  Patient with bilateral pleural effusion moderate to large right and small to moderate left pleural effusion.  No evidence of pneumonia or pulmonary edema.  Patient had  thoracentesis done during this hospitalization.  Will arrange for IR to do thoracentesis on the right.  Patient continues to have dependent edema continue TED hose during the day elevate, lower extremities as much as possible.  Ejection fraction was normal by echocardiogram this admission.  Uric acid down to 4.6.  Labs noted.will add uric acid to daily labs.AMBULATE.  2] anemia and thrombocytopenia due to bone marrow involvement by lymphoma status post blood transfusion.  Not on Lovenox due to thrombocytopenia and anemia.  3] AKI and hyponatremia resolved.  4] hyperglycemia secondary to steroids       DVT prophylaxis: TED hose Code Status: Full code Family Communication none Disposition Plan: Per oncology   Consultants: Oncology, interventional radiology  Procedures: Right thoracentesis, porta cath Antimicrobials none Subjective: Denies shortness of breath cough   Objective: Vitals:   05/10/18 2141 05/11/18 0452 05/11/18 0534 05/11/18 0858  BP: (!) 143/55 122/62    Pulse: 88 94  Resp: 18 18    Temp: 97.9 F (36.6 C) 98.2 F (36.8 C)    TempSrc: Oral Oral    SpO2: 96%  96%  96%  Weight:   66.3 kg (146 lb 2.6 oz)   Height:        Intake/Output Summary (Last 24 hours) at 05/11/2018 1004 Last data filed at 05/11/2018 0700 Gross per 24 hour  Intake 720 ml  Output -  Net 720 ml   Filed Weights   05/09/18 2107 05/10/18 0722 05/11/18 0534  Weight: 66.1 kg (145 lb 11.6 oz) 67.1 kg (148 lb) 66.3 kg (146 lb 2.6 oz)    Examination:  General exam: Appears calm and comfortable  Respiratory system: Decreased breath sounds at the bases auscultation. Respiratory effort normal. Cardiovascular system: S1 & S2 heard, RRR. No JVD, murmurs, rubs, gallops or clicks. No pedal edema. Gastrointestinal system: Abdomen is nondistended, soft and nontender. No organomegaly or masses felt. Normal bowel sounds heard. Central nervous system: Alert and oriented. No focal neurological deficits. Extremities: Symmetric 5 x 5 power. Skin: No rashes, lesions or ulcers Psychiatry: Judgement and insight appear normal. Mood & affect appropriate.     Data Reviewed: I have personally reviewed following labs and imaging studies  CBC: Recent Labs  Lab 05/07/18 0408 05/08/18 0645 05/09/18 0650 05/10/18 0952 05/11/18 0500  WBC 4.6 4.5 4.2 3.0* 9.5  NEUTROABS 2.9  --  3.0 2.3  --   HGB 7.8* 7.2* 9.6* 8.9* 8.9*  HCT 23.2* 21.7* 29.1* 27.3* 26.8*  MCV 91.0 93.5 90.1 91.0 89.9  PLT 135* 124* 112* 106* 99*   Basic Metabolic Panel: Recent Labs  Lab 05/07/18 0408 05/08/18 0645 05/09/18 0650 05/10/18 0952 05/11/18 0500  NA 134* 136 139 138 139  K 5.0 4.3 4.4 3.9 3.9  CL 105 106 107 107 106  CO2 26 27 29 27 30   GLUCOSE 238* 133* 142* 234* 123*  BUN 31* 26* 25* 24* 22*  CREATININE 0.81 0.66 0.68 0.56 0.53  CALCIUM 8.3* 8.2* 8.0* 8.1* 8.5*  MG 2.3  --   --  1.9  --   PHOS 3.6  --   --  3.1  --    GFR: Estimated Creatinine Clearance: 64.8 mL/min (by C-G formula based on SCr of 0.53 mg/dL). Liver Function Tests: Recent Labs  Lab 05/07/18 0408 05/09/18 0650 05/10/18 0952   AST 24 22 28   ALT 19 17 19   ALKPHOS 52 46 48  BILITOT 0.6 0.4 0.4  PROT 8.1 7.8 7.1  ALBUMIN 2.8* 2.7* 2.6*   No results for input(s): LIPASE, AMYLASE in the last 168 hours. No results for input(s): AMMONIA in the last 168 hours. Coagulation Profile: No results for input(s): INR, PROTIME in the last 168 hours. Cardiac Enzymes: No results for input(s): CKTOTAL, CKMB, CKMBINDEX, TROPONINI in the last 168 hours. BNP (last 3 results) No results for input(s): PROBNP in the last 8760 hours. HbA1C: No results for input(s): HGBA1C in the last 72 hours. CBG: Recent Labs  Lab 05/10/18 0740 05/10/18 1149 05/10/18 1700 05/10/18 2001 05/11/18 0808  GLUCAP 130* 211* 222* 252* 134*   Lipid Profile: No results for input(s): CHOL, HDL, LDLCALC, TRIG, CHOLHDL, LDLDIRECT in the last 72 hours. Thyroid Function Tests: No results for input(s): TSH, T4TOTAL, FREET4, T3FREE, THYROIDAB in the last 72 hours. Anemia Panel: No results for input(s): VITAMINB12, FOLATE, FERRITIN, TIBC, IRON, RETICCTPCT in the last 72 hours. Sepsis Labs: No results for input(s): PROCALCITON, LATICACIDVEN in the last  168 hours.  No results found for this or any previous visit (from the past 240 hour(s)).       Radiology Studies: Dg Chest 2 View  Result Date: 05/09/2018 CLINICAL DATA:  Short of breath for 2 days. Cough yesterday but not today. Followup pleural effusion. EXAM: CHEST - 2 VIEW COMPARISON:  07/08/2018 and older exams. FINDINGS: Moderate to large right pleural effusion obscures the right hemidiaphragm and right heart border. Small to moderate left pleural effusion obscures the left hemidiaphragm. There are prominent vascular markings bilaterally without evidence of pulmonary edema. No evidence of pneumonia. No pneumothorax. Cardiac silhouette is normal in size. No mediastinal or hilar masses. Right anterior chest wall Port-A-Cath is stable. IMPRESSION: 1. Moderate to large right and small to moderate left  pleural effusions similar to the previous day's study. No evidence of pneumonia or pulmonary edema. Electronically Signed   By: Lajean Manes M.D.   On: 05/09/2018 15:02        Scheduled Meds: . sodium chloride   Intravenous Once  . sodium chloride   Intravenous Once  . allopurinol  300 mg Oral Daily  . feeding supplement  1 Container Oral TID BM  . insulin aspart  0-5 Units Subcutaneous QHS  . insulin aspart  0-9 Units Subcutaneous TID WC  . ipratropium-albuterol  3 mL Nebulization BID  . lidocaine-EPINEPHrine      . predniSONE  60 mg Oral Q breakfast  . ramelteon  8 mg Oral QHS  . sodium chloride flush  3 mL Intravenous Q12H  . Tbo-filgastrim (GRANIX) SQ  480 mcg Subcutaneous q1800   Continuous Infusions: . sodium chloride       LOS: 16 days     Georgette Shell, MD [facility]  If 7PM-7AM, please contact night-coverage [email or web address] Password [facility] 05/11/2018, 10:04 AM

## 2018-05-11 NOTE — Procedures (Signed)
Arterial Catheter Insertion Procedure Note Deborah Cooley [id number] 04/18/1956  Procedure: Insertion of Arterial Catheter  Indications: Blood pressure monitoring and Frequent blood sampling  Procedure Details Consent: Risks of procedure as well as the alternatives and risks of each were explained to the (patient/caregiver).  Consent for procedure obtained. Time Out: Verified patient identification, verified procedure, site/side was marked, verified correct patient position, special equipment/implants available, medications/allergies/relevent history reviewed, required imaging and test results available.  Performed  Maximum sterile technique was used including antiseptics, cap, gloves, gown, hand hygiene, mask and sheet. Skin prep: Chlorhexidine; local anesthetic administered 20 gauge catheter was inserted into right radial artery using the Seldinger technique. ULTRASOUND GUIDANCE USED: YES Evaluation Blood flow good; BP tracing good. Complications: No apparent complications.   Deborah Cooley, Deborah Cooley 05/11/2018

## 2018-05-11 NOTE — Progress Notes (Signed)
HEMATOLOGY/ONCOLOGY INPATIENT PROGRESS NOTE  Date of Service: 05/11/2018  Inpatient Attending: .Georgette Shell, MD   SUBJECTIVE:   Patient had her therapeutic thoracentesis this AM and notes some improvement in her breathing. She notes that she does not seem to need the oxygen. Gradually improving po intake. No acute new symptoms.   OBJECTIVE:  NAD  PHYSICAL EXAMINATION: . Vitals:   05/11/18 0452 05/11/18 0534 05/11/18 0858 05/11/18 0955  BP: 122/62   134/62  Pulse: 94     Resp: 18     Temp: 98.2 F (36.8 C)     TempSrc: Oral     SpO2: 96%  96%   Weight:  146 lb 2.6 oz (66.3 kg)    Height:       Filed Weights   05/09/18 2107 05/10/18 0722 05/11/18 0534  Weight: 145 lb 11.6 oz (66.1 kg) 148 lb (67.1 kg) 146 lb 2.6 oz (66.3 kg)   .Body mass index is 26.9 kg/m.  GENERAL:alert, in no acute distress and comfortable SKIN: no acute rashes, no significant lesions EYES: conjunctiva are pink and non-injected, sclera anicteric OROPHARYNX: MMM, no exudates, no oropharyngeal erythema or ulceration NECK: supple, no JVD LYMPH:  no palpable lymphadenopathy in the cervical, axillary or inguinal regions LUNGS: decrease AE b/l lung bases rt>left HEART: regular rate & rhythm ABDOMEN:  normoactive bowel sounds , non tender, not distended. Extremity: 2+ pedal edema PSYCH: alert & oriented x 3 with fluent speech NEURO: no focal motor/sensory deficits    MEDICAL HISTORY:  Past Medical History:  Diagnosis Date  . Bilateral pleural effusion 04/17/2018  . Endometriosis   . Essential hypertension, benign   . Family history of adverse reaction to anesthesia    "sister stops breathing I think" (04/18/2018)  . Heart murmur   . Mixed hyperlipidemia   . Pneumonia 04/17/2018    SURGICAL HISTORY: Past Surgical History:  Procedure Laterality Date  . ABDOMINAL HYSTERECTOMY  1970s/1980s   for endometriosis  . Arthroscopic right shoulder surgery    . AXILLARY LYMPH NODE BIOPSY  Left 04/21/2018   Procedure: LEFT AXILLARY LYMPH NODE BIOPSY;  Surgeon: Kieth Brightly, Arta Bruce, MD;  Location: [facility];  Service: General;  Laterality: Left;  . CARPAL TUNNEL RELEASE Right   . GANGLION CYST EXCISION Right   . IR IMAGING GUIDED PORT INSERTION  05/07/2018  . MULTIPLE TOOTH EXTRACTIONS    . OOPHORECTOMY  1997  . PARTIAL HYSTERECTOMY  1984  . Right hand surgery    . SHOULDER OPEN ROTATOR CUFF REPAIR Right   . VESICOVAGINAL FISTULA CLOSURE W/ TAH      SOCIAL HISTORY: Social History   Socioeconomic History  . Marital status: Married    Spouse name: Not on file  . Number of children: Not on file  . Years of education: Not on file  . Highest education level: Not on file  Occupational History  . Occupation: Social research officer, government    Comment: works at [employer]  . Financial resource strain: Not on file  . Food insecurity:    Worry: Not on file    Inability: Not on file  . Transportation needs:    Medical: Not on file    Non-medical: Not on file  Tobacco Use  . Smoking status: Current Every Day Smoker    Packs/day: 1.50    Years: 46.00    Pack years: 69.00    Types: Cigarettes  . Smokeless tobacco: Never Used  Substance and Sexual  Activity  . Alcohol use: Not Currently    Comment: 04/18/2018 "quit years ago"  . Drug use: Not Currently    Comment: "back in my 65s"  . Sexual activity: Not Currently  Lifestyle  . Physical activity:    Days per week: Not on file    Minutes per session: Not on file  . Stress: Not on file  Relationships  . Social connections:    Talks on phone: Not on file    Gets together: Not on file    Attends religious service: Not on file    Active member of club or organization: Not on file    Attends meetings of clubs or organizations: Not on file    Relationship status: Not on file  . Intimate partner violence:    Fear of current or ex partner: Not on file    Emotionally abused: Not on file    Physically abused: Not on  file    Forced sexual activity: Not on file  Other Topics Concern  . Not on file  Social History Narrative   ** Merged History Encounter **       Patient has one son whom is healthy.   Has a live in boyfriend.    FAMILY HISTORY: Family History  Problem Relation Age of Onset  . Diabetes Mother        age 47  . Heart attack Father        died age 56's  . Hypertension Father   . Cancer Sister        breast cancer diagonsed age 86 years  . Diabetes Unknown   . Heart disease Unknown        Female <55  . Arthritis Unknown   . Asthma Unknown   . Hyperlipidemia Other        several siblings  . Thyroid disease Sister        one sister with thyroid disease    ALLERGIES:  has No Known Allergies.  MEDICATIONS:  Scheduled Meds: . sodium chloride   Intravenous Once  . sodium chloride   Intravenous Once  . allopurinol  300 mg Oral Daily  . feeding supplement  1 Container Oral TID BM  . furosemide  20 mg Oral Daily  . insulin aspart  0-5 Units Subcutaneous QHS  . insulin aspart  0-9 Units Subcutaneous TID WC  . ipratropium-albuterol  3 mL Nebulization BID  . lidocaine-EPINEPHrine      . predniSONE  60 mg Oral Q breakfast  . ramelteon  8 mg Oral QHS  . sodium chloride flush  3 mL Intravenous Q12H  . Tbo-filgastrim (GRANIX) SQ  480 mcg Subcutaneous q1800   Continuous Infusions: . sodium chloride     PRN Meds:.acetaminophen **OR** acetaminophen, alteplase, diclofenac sodium, diphenhydrAMINE, guaiFENesin-dextromethorphan, heparin lock flush, HYDROmorphone (DILAUDID) injection, ipratropium-albuterol, polyethylene glycol, prochlorperazine, sodium chloride flush, sodium chloride flush  REVIEW OF SYSTEMS:    10 Point review of Systems was done is negative except as noted above.   LABORATORY DATA:  I have reviewed the data as listed  . CBC Latest Ref Rng & Units 05/11/2018 05/10/2018 05/09/2018  WBC 4.0 - 10.5 K/uL 9.5 3.0(L) 4.2  Hemoglobin 12.0 - 15.0 g/dL 8.9(L) 8.9(L) 9.6(L)    Hematocrit 36.0 - 46.0 % 26.8(L) 27.3(L) 29.1(L)  Platelets 150 - 400 K/uL 99(L) 106(L) 112(L)    . CMP Latest Ref Rng & Units 05/11/2018 05/10/2018 05/09/2018  Glucose 65 - 99 mg/dL 123(H) 234(H) 142(H)  BUN 6 - 20 mg/dL 22(H) 24(H) 25(H)  Creatinine 0.44 - 1.00 mg/dL 0.53 0.56 0.68  Sodium 135 - 145 mmol/L 139 138 139  Potassium 3.5 - 5.1 mmol/L 3.9 3.9 4.4  Chloride 101 - 111 mmol/L 106 107 107  CO2 22 - 32 mmol/L _0 Calcium 8.9 - 10.3 mg/dL 8.5(L) 8.1(L) 8.0(L)  Total Protein 6.5 - 8.1 g/dL - 7.1 7.8  Total Bilirubin 0.3 - 1.2 mg/dL - 0.4 0.4  Alkaline Phos 38 - 126 U/L - 48 46  AST 15 - 41 U/L - 28 22  ALT 14 - 54 U/L - 19 17    Component     Latest Ref Rng & Units 05/10/2018  Magnesium     1.7 - 2.4 mg/dL 1.9  Phosphorus     2.5 - 4.6 mg/dL 3.1  Uric Acid, Serum     2.3 - 6.6 mg/dL 3.8    RADIOGRAPHIC STUDIES: I have personally reviewed the radiological images as listed and agreed with the findings in the report. Dg Chest 1 View  Result Date: 05/11/2018 CLINICAL DATA:  Post right-sided thoracentesis EXAM: CHEST  1 VIEW COMPARISON:  05/09/2018 FINDINGS: Interval reduction/near resolution of residual trace right-sided effusion post thoracentesis. No pneumothorax. No change to slight increase in small to moderate size left-sided effusion. Improved aeration of lung base with persistent right basilar heterogeneous opacities. Unchanged left basilar consolidative opacities. Pulmonary vasculature remains indistinct with cephalization of flow and nodular thickening of the pulmonary interstitium. Grossly unchanged cardiac silhouette and mediastinal contours with atherosclerotic plaque within the thoracic aorta. Calcification overlying the right-sided the thoracic inlet is unchanged and compatible with known peripherally calcified thyroid nodule. Stable position of support apparatus. No acute osseus abnormalities. IMPRESSION: 1. Interval reduction/near resolution of residual trace  effusion post thoracentesis. No pneumothorax. 2. No change to slight increase in small to moderate size left-sided pleural effusion. 3. Overall improved aeration of lungs with persistent findings of pulmonary venous congestion/mild pulmonary edema. Electronically Signed   By: Sandi Mariscal M.D.   On: 05/11/2018 10:44   Dg Chest 1 View  Result Date: 05/08/2018 CLINICAL DATA:  Wheezing EXAM: CHEST  1 VIEW COMPARISON:  Chest x-ray of 04/23/2017 FINDINGS: There has been increase in volume of right pleural effusion with increasing right basilar atelectasis. A smaller left pleural effusion remains with probable partial atelectasis at the left lung base as well. The heart is mildly enlarged. A Port-A-Cath is now present with the tip overlying the mid lower SVC. No pneumothorax is seen. No bony abnormality is noted. IMPRESSION: 1. Increase in volume of right pleural effusion with increasing right basilar atelectasis. 2. Small left pleural effusion remains with probable partial atelectasis of the left lung base as well. 3. Port-A-Cath tip overlies the mid lower SVC. Electronically Signed   By: Ivar Drape M.D.   On: 05/08/2018 11:51   Dg Chest 2 View  Result Date: 05/09/2018 CLINICAL DATA:  Short of breath for 2 days. Cough yesterday but not today. Followup pleural effusion. EXAM: CHEST - 2 VIEW COMPARISON:  07/08/2018 and older exams. FINDINGS: Moderate to large right pleural effusion obscures the right hemidiaphragm and right heart border. Small to moderate left pleural effusion obscures the left hemidiaphragm. There are prominent vascular markings bilaterally without evidence of pulmonary edema. No evidence of pneumonia. No pneumothorax. Cardiac silhouette is normal in size. No mediastinal or hilar masses. Right anterior chest wall Port-A-Cath is stable. IMPRESSION: 1. Moderate to large right and small  to moderate left pleural effusions similar to the previous day's study. No evidence of pneumonia or pulmonary  edema. Electronically Signed   By: Lajean Manes M.D.   On: 05/09/2018 15:02   Dg Chest 2 View  Result Date: 04/23/2018 CLINICAL DATA:  Recent thoracentesis, worsening shortness of breath would like swelling. Productive cough. History of pleural effusion, pneumonia, left axillary node biopsy April 21, 2018. EXAM: CHEST - 2 VIEW COMPARISON:  Chest radiograph April 19, 2018 and CT chest Apr 18, 2018 FINDINGS: Increasing small bilateral pleural effusions. Diffuse interstitial prominence. Increased lung volumes and flattened hemidiaphragms. Worsening patchy right middle lobe airspace opacity. Left lung base atelectasis. Cardiac silhouette mildly enlarged. Calcified aortic arch. No pneumothorax. Calcified right thyroid nodule. Soft tissue planes and included osseous structures are not suspicious. IMPRESSION: Increasing small bilateral pleural effusions. Worsening right middle lobe airspace opacity with left lung base atelectasis. Mild cardiomegaly.  COPD. Aortic Atherosclerosis (ICD10-I70.0). Electronically Signed   By: Elon Alas M.D.   On: 04/23/2018 21:15   Dg Chest 2 View  Result Date: 04/19/2018 CLINICAL DATA:  Productive cough and shortness of breath for 1 week. EXAM: CHEST - 2 VIEW COMPARISON:  PA and lateral chest 04/17/2018. CT chest, abdomen and pelvis 04/18/2018. FINDINGS: Small bilateral pleural effusions are seen, greater on the right. Basilar atelectasis is noted. No consolidative process or pneumothorax. Heart size is normal. Aortic atherosclerosis is noted. Calcified lesion in the right lobe of the thyroid is noted. No acute bony abnormality. IMPRESSION: Small bilateral pleural effusions and mild basilar atelectasis. Atherosclerosis. Electronically Signed   By: Inge Rise M.D.   On: 04/19/2018 13:55   Dg Chest 2 View  Result Date: 04/17/2018 CLINICAL DATA:  Abdominal pain.  Shortness of breath. EXAM: CHEST - 2 VIEW COMPARISON:  None. FINDINGS: The heart size is normal. The hila and  mediastinum are unremarkable. A 2.2 cm eggshell calcification is seen in the right thoracic inlet. No pneumothorax. Mild interstitial prominence bilaterally. Small bilateral pleural effusions. No nodules or masses. No other acute abnormalities. IMPRESSION: 1. Mild interstitial opacities in the lungs are nonspecific. Edema could have this appearance but the lack of cardiomegaly would be unusual. Atypical infection is possible. 2. Small bilateral effusions. 3. The eggshell calcification in the right thoracic inlet is nonspecific. This could be a vascular structure or a calcified nodule in the thyroid. Electronically Signed   By: Dorise Bullion III M.D   On: 04/17/2018 23:32   Ct Angio Chest Pe W/cm &/or Wo Cm  Addendum Date: 04/18/2018   ADDENDUM REPORT: 04/18/2018 08:59 ADDENDUM: Add to IMPRESSION: There is wall thickening in the urinary bladder with mild soft tissue stranding adjacent to the bladder. Suspect a degree of cystitis. Electronically Signed   By: Lowella Grip III M.D.   On: 04/18/2018 08:59   Result Date: 04/18/2018 CLINICAL DATA:  Shortness of breath.  Abdominal pain EXAM: CT ANGIOGRAPHY CHEST CT ABDOMEN AND PELVIS WITH CONTRAST TECHNIQUE: Multidetector CT imaging of the chest was performed using the standard protocol during bolus administration of intravenous contrast. Multiplanar CT image reconstructions and MIPs were obtained to evaluate the vascular anatomy. Multidetector CT imaging of the abdomen and pelvis was performed using the standard protocol during bolus administration of intravenous contrast. CONTRAST:  154m ISOVUE-370 IOPAMIDOL (ISOVUE-370) INJECTION 76% COMPARISON:  Chest radiograph Apr 17, 2018 FINDINGS: CTA CHEST FINDINGS Cardiovascular: There is no demonstrable pulmonary embolus. There is no thoracic aortic aneurysm or dissection. There are foci of calcification in the proximal  visualized great vessels. There is atherosclerotic calcification in the thoracic aorta. There are  scattered foci of coronary artery calcification. There is a small pericardial effusion. Mediastinum/Nodes: There is a partially calcified mass arising from the right lobe of the thyroid measuring 2.7 x 1.9 cm. Thyroid otherwise appears unremarkable. There is extensive adenopathy throughout the chest. There are multiple supraclavicular lymph nodes, largest measuring 1.0 x 0.9 cm. There are multiple axillary lymph nodes. The largest axillary lymph node on the left measures 2.5 x 2.1 cm cm. The largest axillary lymph node on the right measures 2.3 x 2.0 cm. There is adenopathy throughout the mediastinum. There are multiple aortopulmonary window region lymph nodes, largest measuring 1.4 x 1.4 cm. There is extensive adenopathy in the mediastinum surrounding the trachea and carina. There is a right pretracheal lymph node measuring 1.9 x 1.8 cm. There is a lymph node to the left of the origin of the carina measuring 1.6 x 1.5 cm. There is extensive adenopathy in the right hilar region. The largest individual lymph node in the right hilum measures 2.2 x 1.4 cm. There are multiple left hilar lymph nodes. The largest left hilar lymph node measures 1.8 x 1.3 cm. There is extensive subcarinal adenopathy. Largest subcarinal lymph node measures 2.8 x 2.4 cm. There are retrocrural lymph nodes bilaterally, largest on the left measuring 1.3 x 1.0 cm. There are several small lymph nodes at the level of the right pericardiophrenic angle. No esophageal lesions are evident. Lungs/Pleura: There are free-flowing pleural effusions bilaterally, larger on the right than on the left. There is consolidation in both lung bases, likely primarily due to compressive atelectasis. There is atelectatic change with volume loss in the right middle lobe medially with focal consolidation adjacent to the right heart border in the medial segment right middle lobe. No pulmonary nodular type lesion is evident on this study. Musculoskeletal: There are no  blastic or lytic bone lesions. No chest wall lesions are evident. Several benign-appearing calcifications are noted in the right breast. Review of the MIP images confirms the above findings. CT ABDOMEN and PELVIS FINDINGS Hepatobiliary: No focal liver lesions are appreciable. There is no appreciable gallbladder wall thickening. No biliary duct dilatation. Pancreas: No pancreatic mass or inflammatory focus. Extensive peripancreatic adenopathy is noted. Spleen: Spleen measures 15.0 x 12.4 x 6.8 cm with a measured splenic volume of 632 cubic cm. No focal splenic lesions are evident. Adrenals/Urinary Tract: Adrenals bilaterally appear unremarkable. Kidneys bilaterally show no evident mass or hydronephrosis. No renal or ureteral calculus. No hydronephrosis. Urinary bladder is midline with thickening of the urinary bladder wall. There is also mild soft tissue stranding surrounding the urinary bladder. Stomach/Bowel: There is no appreciable bowel wall or mesenteric thickening. There is no evident bowel obstruction. No free air or portal venous air. Vascular/Lymphatic: There is atherosclerotic calcification throughout the aorta and iliac arteries. There is moderate narrowing of the aorta with what appears to be hemodynamically significant obstruction in both common femoral arteries. No aneurysm. Major mesenteric arterial vessels appear patent. There is extensive adenopathy throughout the abdomen. There are multiple enlarged lymph nodes in the peripancreatic region, largest measuring 1.7 x 1.5 cm. Scattered prominent mesenteric lymph nodes are noted throughout the abdomen. There is extensive retroperitoneal adenopathy. Largest retroperitoneal lymph node measures 2.5 x 1.5 cm. There is adenopathy to the right of the celiac axis measuring 2.3 x 1.5 cm. Adenopathy is seen in both external iliac node chains. The largest lymph node in this area on the right  measures 2.6 x 1.7 cm. The largest lymph node in this region on the left  measures 2.0 by 1.4 cm. There is also extensive adenopathy more posteriorly in the pelvis at the level of each acetabulum. The largest of these lymph nodes on the right measures 3.1 x 1.7 cm. Largest of these lymph nodes on the left measures 3.2 x 1.6 cm. There is adenopathy in both inguinal regions. Largest of these lymph nodes in the inguinal regions on the right measures 1.4 x 1.3 cm. Largest lymph node in the left inguinal region measures 1.8 x 1.6 cm. Reproductive: Uterus is apparently absent. No pelvic mass apart from adenopathy seen. Other: Appendix region appears normal. No abscess is evident in the abdomen or pelvis. There is mild ascites. Musculoskeletal: There are no appreciable blastic or lytic bone lesions. No intramuscular or abdominal wall lesions are evident. Review of the MIP images confirms the above findings. IMPRESSION: CT angiogram chest: 1. No demonstrable pulmonary embolus. No thoracic aortic aneurysm or dissection. Multiple foci of aortic atherosclerosis noted as well as foci of calcification in great vessels and coronary arteries. 2. Extensive adenopathy at multiple sites as summarized above. This extensive adenopathy raises concern for potential lymphoma. Small cell lung carcinoma could present in this manner as well. 3. Moderate pleural effusions bilaterally, larger on the right than on the left, with compressive atelectasis in the lung bases. There is focal consolidation felt to represent pneumonia adjacent to the right heart border in the medial segment right middle lobe. 4. Partially calcified dominant mass right lobe of thyroid. This mass may warrant nonemergent thyroid ultrasound to further evaluate. CT abdomen and pelvis: 1. Widespread abdominal and pelvic adenopathy. Splenomegaly. This combination of findings raises concern for lymphoma as most likely etiology. 2.  Mild ascites. 3. No abscess in the abdomen or pelvis. No periappendiceal region inflammation. 4. Extent extensive  aortoiliac atherosclerosis with what appears to be hemodynamically significant obstruction in the common iliac arteries bilaterally. 5.  Uterus apparently absent. Aortic Atherosclerosis (ICD10-I70.0). Electronically Signed: By: Lowella Grip III M.D. On: 04/18/2018 08:41   Ct Abdomen Pelvis W Contrast  Addendum Date: 04/18/2018   ADDENDUM REPORT: 04/18/2018 08:59 ADDENDUM: Add to IMPRESSION: There is wall thickening in the urinary bladder with mild soft tissue stranding adjacent to the bladder. Suspect a degree of cystitis. Electronically Signed   By: Lowella Grip III M.D.   On: 04/18/2018 08:59   Result Date: 04/18/2018 CLINICAL DATA:  Shortness of breath.  Abdominal pain EXAM: CT ANGIOGRAPHY CHEST CT ABDOMEN AND PELVIS WITH CONTRAST TECHNIQUE: Multidetector CT imaging of the chest was performed using the standard protocol during bolus administration of intravenous contrast. Multiplanar CT image reconstructions and MIPs were obtained to evaluate the vascular anatomy. Multidetector CT imaging of the abdomen and pelvis was performed using the standard protocol during bolus administration of intravenous contrast. CONTRAST:  143m ISOVUE-370 IOPAMIDOL (ISOVUE-370) INJECTION 76% COMPARISON:  Chest radiograph Apr 17, 2018 FINDINGS: CTA CHEST FINDINGS Cardiovascular: There is no demonstrable pulmonary embolus. There is no thoracic aortic aneurysm or dissection. There are foci of calcification in the proximal visualized great vessels. There is atherosclerotic calcification in the thoracic aorta. There are scattered foci of coronary artery calcification. There is a small pericardial effusion. Mediastinum/Nodes: There is a partially calcified mass arising from the right lobe of the thyroid measuring 2.7 x 1.9 cm. Thyroid otherwise appears unremarkable. There is extensive adenopathy throughout the chest. There are multiple supraclavicular lymph nodes, largest measuring 1.0 x 0.9  cm. There are multiple axillary  lymph nodes. The largest axillary lymph node on the left measures 2.5 x 2.1 cm cm. The largest axillary lymph node on the right measures 2.3 x 2.0 cm. There is adenopathy throughout the mediastinum. There are multiple aortopulmonary window region lymph nodes, largest measuring 1.4 x 1.4 cm. There is extensive adenopathy in the mediastinum surrounding the trachea and carina. There is a right pretracheal lymph node measuring 1.9 x 1.8 cm. There is a lymph node to the left of the origin of the carina measuring 1.6 x 1.5 cm. There is extensive adenopathy in the right hilar region. The largest individual lymph node in the right hilum measures 2.2 x 1.4 cm. There are multiple left hilar lymph nodes. The largest left hilar lymph node measures 1.8 x 1.3 cm. There is extensive subcarinal adenopathy. Largest subcarinal lymph node measures 2.8 x 2.4 cm. There are retrocrural lymph nodes bilaterally, largest on the left measuring 1.3 x 1.0 cm. There are several small lymph nodes at the level of the right pericardiophrenic angle. No esophageal lesions are evident. Lungs/Pleura: There are free-flowing pleural effusions bilaterally, larger on the right than on the left. There is consolidation in both lung bases, likely primarily due to compressive atelectasis. There is atelectatic change with volume loss in the right middle lobe medially with focal consolidation adjacent to the right heart border in the medial segment right middle lobe. No pulmonary nodular type lesion is evident on this study. Musculoskeletal: There are no blastic or lytic bone lesions. No chest wall lesions are evident. Several benign-appearing calcifications are noted in the right breast. Review of the MIP images confirms the above findings. CT ABDOMEN and PELVIS FINDINGS Hepatobiliary: No focal liver lesions are appreciable. There is no appreciable gallbladder wall thickening. No biliary duct dilatation. Pancreas: No pancreatic mass or inflammatory focus.  Extensive peripancreatic adenopathy is noted. Spleen: Spleen measures 15.0 x 12.4 x 6.8 cm with a measured splenic volume of 632 cubic cm. No focal splenic lesions are evident. Adrenals/Urinary Tract: Adrenals bilaterally appear unremarkable. Kidneys bilaterally show no evident mass or hydronephrosis. No renal or ureteral calculus. No hydronephrosis. Urinary bladder is midline with thickening of the urinary bladder wall. There is also mild soft tissue stranding surrounding the urinary bladder. Stomach/Bowel: There is no appreciable bowel wall or mesenteric thickening. There is no evident bowel obstruction. No free air or portal venous air. Vascular/Lymphatic: There is atherosclerotic calcification throughout the aorta and iliac arteries. There is moderate narrowing of the aorta with what appears to be hemodynamically significant obstruction in both common femoral arteries. No aneurysm. Major mesenteric arterial vessels appear patent. There is extensive adenopathy throughout the abdomen. There are multiple enlarged lymph nodes in the peripancreatic region, largest measuring 1.7 x 1.5 cm. Scattered prominent mesenteric lymph nodes are noted throughout the abdomen. There is extensive retroperitoneal adenopathy. Largest retroperitoneal lymph node measures 2.5 x 1.5 cm. There is adenopathy to the right of the celiac axis measuring 2.3 x 1.5 cm. Adenopathy is seen in both external iliac node chains. The largest lymph node in this area on the right measures 2.6 x 1.7 cm. The largest lymph node in this region on the left measures 2.0 by 1.4 cm. There is also extensive adenopathy more posteriorly in the pelvis at the level of each acetabulum. The largest of these lymph nodes on the right measures 3.1 x 1.7 cm. Largest of these lymph nodes on the left measures 3.2 x 1.6 cm. There is adenopathy in  both inguinal regions. Largest of these lymph nodes in the inguinal regions on the right measures 1.4 x 1.3 cm. Largest lymph node  in the left inguinal region measures 1.8 x 1.6 cm. Reproductive: Uterus is apparently absent. No pelvic mass apart from adenopathy seen. Other: Appendix region appears normal. No abscess is evident in the abdomen or pelvis. There is mild ascites. Musculoskeletal: There are no appreciable blastic or lytic bone lesions. No intramuscular or abdominal wall lesions are evident. Review of the MIP images confirms the above findings. IMPRESSION: CT angiogram chest: 1. No demonstrable pulmonary embolus. No thoracic aortic aneurysm or dissection. Multiple foci of aortic atherosclerosis noted as well as foci of calcification in great vessels and coronary arteries. 2. Extensive adenopathy at multiple sites as summarized above. This extensive adenopathy raises concern for potential lymphoma. Small cell lung carcinoma could present in this manner as well. 3. Moderate pleural effusions bilaterally, larger on the right than on the left, with compressive atelectasis in the lung bases. There is focal consolidation felt to represent pneumonia adjacent to the right heart border in the medial segment right middle lobe. 4. Partially calcified dominant mass right lobe of thyroid. This mass may warrant nonemergent thyroid ultrasound to further evaluate. CT abdomen and pelvis: 1. Widespread abdominal and pelvic adenopathy. Splenomegaly. This combination of findings raises concern for lymphoma as most likely etiology. 2.  Mild ascites. 3. No abscess in the abdomen or pelvis. No periappendiceal region inflammation. 4. Extent extensive aortoiliac atherosclerosis with what appears to be hemodynamically significant obstruction in the common iliac arteries bilaterally. 5.  Uterus apparently absent. Aortic Atherosclerosis (ICD10-I70.0). Electronically Signed: By: Lowella Grip III M.D. On: 04/18/2018 08:41   Ct Bone Marrow Biopsy & Aspiration  Result Date: 05/05/2018 INDICATION: Lymphoma EXAM: CT BONE MARROW BIOPSY AND ASPIRATION  MEDICATIONS: None. ANESTHESIA/SEDATION: Fentanyl 75 mcg IV; Versed 2 mg IV Moderate Sedation Time:  32 minutes The patient was continuously monitored during the procedure by the interventional radiology nurse under my direct supervision. FLUOROSCOPY TIME:  Fluoroscopy Time:  minutes  seconds ( mGy). COMPLICATIONS: None immediate. PROCEDURE: Informed written consent was obtained from the patient after a thorough discussion of the procedural risks, benefits and alternatives. All questions were addressed. Maximal Sterile Barrier Technique was utilized including caps, mask, sterile gowns, sterile gloves, sterile drape, hand hygiene and skin antiseptic. A timeout was performed prior to the initiation of the procedure. Under CT guidance, a(n) 11 gauge guide needle was advanced into the right iliac bone. Aspirates and a core were obtained. Post biopsy images demonstrate . Patient tolerated the procedure well without complication. Vital sign monitoring by nursing staff during the procedure will continue as patient is in the special procedures unit for post procedure observation. FINDINGS: The images document guide needle placement within the right iliac bone. Post biopsy images demonstrate no hemorrhage. IMPRESSION: Successful CT-guided right iliac bone marrow aspirate and core. Electronically Signed   By: Marybelle Killings M.D.   On: 05/05/2018 13:33   Ir Imaging Guided Port Insertion  Result Date: 05/07/2018 INDICATION: 62 year old female with a history of lymphoma. She has been referred for port catheter. EXAM: IMPLANTED PORT A CATH PLACEMENT WITH ULTRASOUND AND FLUOROSCOPIC GUIDANCE MEDICATIONS: 2 g Ancef; The antibiotic was administered within an appropriate time interval prior to skin puncture. ANESTHESIA/SEDATION: Moderate (conscious) sedation was employed during this procedure. A total of Versed mg and Fentanyl 100 mcg was administered intravenously. Moderate Sedation Time: 20 minutes. The patient's level of  consciousness and vital  signs were monitored continuously by radiology nursing throughout the procedure under my direct supervision. FLUOROSCOPY TIME:  0 minutes, 6 seconds (1.5 mGy) COMPLICATIONS: None PROCEDURE: The procedure, risks, benefits, and alternatives were explained to the patient. Questions regarding the procedure were encouraged and answered. The patient understands and consents to the procedure. Ultrasound survey was performed with images stored and sent to PACs. The right neck and chest was prepped with chlorhexidine, and draped in the usual sterile fashion using maximum barrier technique (cap and mask, sterile gown, sterile gloves, large sterile sheet, hand hygiene and cutaneous antiseptic). Antibiotic prophylaxis was provided with 2.0g Ancef administered IV one hour prior to skin incision. Local anesthesia was attained by infiltration with 1% lidocaine without epinephrine. Ultrasound demonstrated patency of the right internal jugular vein, and this was documented with an image. Under real-time ultrasound guidance, this vein was accessed with a 21 gauge micropuncture needle and image documentation was performed. A small dermatotomy was made at the access site with an 11 scalpel. A 0.018" wire was advanced into the SVC and used to estimate the length of the internal catheter. The access needle exchanged for a 76F micropuncture vascular sheath. The 0.018" wire was then removed and a 0.035" wire advanced into the IVC. An appropriate location for the subcutaneous reservoir was selected below the clavicle and an incision was made through the skin and underlying soft tissues. The subcutaneous tissues were then dissected using a combination of blunt and sharp surgical technique and a pocket was formed. A single lumen power injectable portacatheter was then tunneled through the subcutaneous tissues from the pocket to the dermatotomy and the port reservoir placed within the subcutaneous pocket. The venous  access site was then serially dilated and a peel away vascular sheath placed over the wire. The wire was removed and the port catheter advanced into position under fluoroscopic guidance. The catheter tip is positioned in the cavoatrial junction. This was documented with a spot image. The portacatheter was then tested and found to flush and aspirate well. The port was flushed with saline and Huber needle was left in place at the completion. The pocket was then closed in two layers using first subdermal inverted interrupted absorbable sutures followed by a running subcuticular suture. The epidermis was then sealed with Dermabond. The dermatotomy at the venous access site was also seal with Dermabond and Steri-Strips. Patient tolerated the procedure well and remained hemodynamically stable throughout. No complications encountered and no significant blood loss encountered . IMPRESSION: Status post right IJ port catheter placement. Catheter ready for use. Signed, Dulcy Fanny. Dellia Nims, RPVI [facility] Electronically Signed   By: Corrie Mckusick D.O.   On: 05/07/2018 17:19   US Thoracentesis Asp Pleural Space W/img Guide  Result Date: 05/11/2018 INDICATION: Symptomatic right sided pleural effusion. Please perform ultrasound-guided thoracentesis for therapeutic purposes. EXAM: US THORACENTESIS ASP PLEURAL SPACE W/IMG GUIDE COMPARISON:  Chest CT-04/18/2018; chest radiograph-05/09/2018 MEDICATIONS: None. COMPLICATIONS: None immediate. TECHNIQUE: Informed written consent was obtained from the patient after a discussion of the risks, benefits and alternatives to treatment. A timeout was performed prior to the initiation of the procedure. Initial ultrasound scanning demonstrates a large anechoic right-sided pleural effusion. The lower chest was prepped and draped in the usual sterile fashion. 1% lidocaine was used for local anesthesia. An ultrasound image was saved for  documentation purposes. An 8 Fr Safe-T-Centesis catheter was introduced. The thoracentesis was performed. The catheter was removed and a dressing was applied. The  patient tolerated the procedure well without immediate post procedural complication. The patient was escorted to have an upright chest radiograph. FINDINGS: A total of approximately 1.7 liters of serous fluid was removed. IMPRESSION: Successful ultrasound-guided right sided thoracentesis yielding 1.7 liters of pleural fluid. Electronically Signed   By: Sandi Mariscal M.D.   On: 05/11/2018 10:45    ASSESSMENT & PLAN:   62 y.o. 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 2. General LNadenopathy from lymphoma with b/l pleural effusion and b/l lower extremity weakness  3. Anemia/thrombocytopenia -  from Bone marrow involvement by lymphoma BM Bx confirms involved by T cell lymphoma. Hgb upto 9.6 post transfusion with patient feeling much better.  4. 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 LDH wnl no evidnece of overt hemolysis at this timne   5. CMV IgM+ -- will ceck CMV PCR quantitative (lab pending)  6. High risk for TLS - on allopurinol , received Rasburicase. Uric acid improving down to 3.8 Uric acid 16--->8--> 4.6  7. Port a cath in situ  8. 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 -no evidence of uncontrolled TLS at this time -daily cbc/diff/platelets, cmp, uric acid -nutritional consultation to optimize po intake -- will switch to Brentuximab + CHP as outpatient from C[facility]. -granix daily for 5 days (D2 today) -CXR 6/23 reviewed--Interval reduction/near resolution of residual trace effusion post thoracentesis. No pneumothorax. -started on low dose lasix 66m po daily  -will need outpatient PET/CT for baseline and response to treatment assessment. -PT/OT  evaluation. -ambulate to evaluate O2 need -- wean off oxygen as tolerated -Continue eating well and emphasized that protein intake will help with fluid collection  . The total time spent in the appointment was 35 minutes and more than 50% was on counseling and direct patient cares.     GSullivan LoneMD MCass LakeAAHIVMS SHeritage Valley BeaverCTrinitas Hospital - New Point CampusHematology/Oncology Physician CHealdsburg District Hospital (Office):       [phone](Work cell):  [phone](Fax):           [phone] 05/11/2018 10:21 AM

## 2018-05-11 NOTE — Progress Notes (Signed)
At about 1040 patient arrived back to room from radiology after having thoracentesis, patient was stable, no complaints. Very shortly after she started coughing, this progressed to coughing up yellow tinged fluid, then patient started vomiting . Patient was given compazine for nausea at 1106. Patient continued to cough up fluids and vomit, radiology was notified and Dr Pascal Lux was paged. Chest xray was ordered, completed at 1139. At this time patient was going into distress and rapid response was called. RR nurse called critical care and we moved patient to the ICU.

## 2018-05-11 NOTE — Procedures (Signed)
Pre procedural Dx: Symptomatic Pleural effusion Post procedural Dx: Same  Successful US guided right sided thoracentesis yielding 1.7  L of serous pleural fluid.    EBL: None  Complications: None immediate.  Ronny Bacon, MD Pager #: [phone]

## 2018-05-11 NOTE — Progress Notes (Signed)
eLink Physician-Brief Progress Note Patient Name: Deborah Cooley DOB: 1956/09/22 MRN: [id number]   Date of Service  05/11/2018  HPI/Events of Note  Hypotension - BP = 76/43 with MAP = 53. Last LVEF = 60-65% on 04/30/2018.  eICU Interventions  Will order: 1. Bolus with 0.9 NaCl 1 liter IV over 1 hour now.       Intervention Category Major Interventions: Hypotension - evaluation and management  Sommer,Steven Eugene 05/11/2018, 8:21 PM

## 2018-05-11 NOTE — Progress Notes (Signed)
eLink Physician-Brief Progress Note Patient Name: Deborah Cooley DOB: 1956-03-10 MRN: [id number]   Date of Service  05/11/2018  HPI/Events of Note  Hypotension - BP = 90/43 with MAP = 58.  eICU Interventions  Will order: 1. Bolus with 0.9 NaCl 1 liter IV over 1 hour now.      Intervention Category Major Interventions: Hypotension - evaluation and management  Sommer,Steven Eugene 05/11/2018, 9:28 PM

## 2018-05-12 ENCOUNTER — Inpatient Hospital Stay (HOSPITAL_COMMUNITY): Payer: Self-pay

## 2018-05-12 ENCOUNTER — Encounter (HOSPITAL_COMMUNITY): Payer: Self-pay | Admitting: Oncology

## 2018-05-12 LAB — GLUCOSE, CAPILLARY
GLUCOSE-CAPILLARY: 98 mg/dL (ref 65–99)
GLUCOSE-CAPILLARY: 115 mg/dL — AB (ref 65–99)
Glucose-Capillary: 175 mg/dL — ABNORMAL HIGH (ref 65–99)
Glucose-Capillary: 189 mg/dL — ABNORMAL HIGH (ref 65–99)
Glucose-Capillary: 150 mg/dL — ABNORMAL HIGH (ref 65–99)
Glucose-Capillary: 148 mg/dL — ABNORMAL HIGH (ref 65–99)

## 2018-05-12 LAB — CBC
HEMATOCRIT: 29.8 % — AB (ref 36.0–46.0)
HEMOGLOBIN: 9.8 g/dL — AB (ref 12.0–15.0)
MCH: 29.1 pg (ref 26.0–34.0)
MCHC: 32.9 g/dL (ref 30.0–36.0)
MCV: 88.4 fL (ref 78.0–100.0)
PLATELETS: 84 10*3/uL — AB (ref 150–400)
RBC: 3.37 MIL/uL — AB (ref 3.87–5.11)
RDW: 16.1 % — ABNORMAL HIGH (ref 11.5–15.5)
WBC: 16.3 10*3/uL — AB (ref 4.0–10.5)

## 2018-05-12 LAB — BLOOD GAS, ARTERIAL
ACID-BASE EXCESS: 3.1 mmol/L — AB (ref 0.0–2.0)
Bicarbonate: 26.6 mmol/L (ref 20.0–28.0)
Drawn by: 225631
FIO2: 60
LHR: 20 {breaths}/min
MECHVT: 400 mL
O2 SAT: 99 %
PATIENT TEMPERATURE: 98.8
PCO2 ART: 38.5 mmHg (ref 32.0–48.0)
PEEP/CPAP: 8 cmH2O
PH ART: 7.455 — AB (ref 7.350–7.450)
pO2, Arterial: 130 mmHg — ABNORMAL HIGH (ref 83.0–108.0)

## 2018-05-12 LAB — COMPREHENSIVE METABOLIC PANEL
ALT: 15 U/L (ref 14–54)
AST: 23 U/L (ref 15–41)
Albumin: 1.9 g/dL — ABNORMAL LOW (ref 3.5–5.0)
Alkaline Phosphatase: 42 U/L (ref 38–126)
Anion gap: 3 — ABNORMAL LOW (ref 5–15)
BUN: 26 mg/dL — AB (ref 6–20)
CHLORIDE: 109 mmol/L (ref 101–111)
CO2: 29 mmol/L (ref 22–32)
CREATININE: 0.5 mg/dL (ref 0.44–1.00)
Calcium: 7.8 mg/dL — ABNORMAL LOW (ref 8.9–10.3)
GFR calc Af Amer: >60 mL/min (ref >60)
GFR calc non Af Amer: >60 mL/min (ref >60)
Glucose, Bld: 115 mg/dL — ABNORMAL HIGH (ref 65–99)
Potassium: 3.6 mmol/L (ref 3.5–5.1)
SODIUM: 141 mmol/L (ref 135–145)
Total Bilirubin: 0.6 mg/dL (ref 0.3–1.2)
Total Protein: 5.4 g/dL — ABNORMAL LOW (ref 6.5–8.1)

## 2018-05-12 LAB — C-REACTIVE PROTEIN: CRP: 5.2 mg/dL — ABNORMAL HIGH (ref <1.0)

## 2018-05-12 LAB — PHOSPHORUS
Phosphorus: 2.6 mg/dL (ref 2.5–4.6)
Phosphorus: 3 mg/dL (ref 2.5–4.6)

## 2018-05-12 LAB — SEDIMENTATION RATE: SED RATE: 115 mm/h — AB (ref 0–22)

## 2018-05-12 LAB — PROTIME-INR
INR: 1.13
Prothrombin Time: 14.4 seconds (ref 11.4–15.2)

## 2018-05-12 LAB — MAGNESIUM
MAGNESIUM: 1.7 mg/dL (ref 1.7–2.4)
Magnesium: 1.7 mg/dL (ref 1.7–2.4)

## 2018-05-12 LAB — PROCALCITONIN: Procalcitonin: 5.14 ng/mL

## 2018-05-12 LAB — URIC ACID: URIC ACID, SERUM: 4 mg/dL (ref 2.3–6.6)

## 2018-05-12 MED ORDER — POTASSIUM CHLORIDE 20 MEQ/15ML (10%) PO SOLN
40.0000 meq | Freq: Once | ORAL | Status: AC
  Administered 2018-05-12: 40 meq
  Filled 2018-05-12: qty 30

## 2018-05-12 MED ORDER — PRO-STAT SUGAR FREE PO LIQD
30.0000 mL | Freq: Two times a day (BID) | ORAL | Status: DC
  Administered 2018-05-12: 30 mL
  Filled 2018-05-12: qty 30

## 2018-05-12 MED ORDER — FUROSEMIDE 10 MG/ML IJ SOLN
40.0000 mg | Freq: Once | INTRAMUSCULAR | Status: AC
  Administered 2018-05-12: 40 mg via INTRAVENOUS
  Filled 2018-05-12: qty 4

## 2018-05-12 MED ORDER — VITAL HIGH PROTEIN PO LIQD
1000.0000 mL | ORAL | Status: DC
  Administered 2018-05-12: 1000 mL

## 2018-05-12 MED ORDER — VITAL AF 1.2 CAL PO LIQD
1000.0000 mL | ORAL | Status: DC
  Administered 2018-05-12: 1000 mL

## 2018-05-12 MED ORDER — PHENYLEPHRINE HCL-NACL 10-0.9 MG/250ML-% IV SOLN
0.0000 ug/min | INTRAVENOUS | Status: DC
  Administered 2018-05-12: 40 ug/min via INTRAVENOUS
  Administered 2018-05-12: 20 ug/min via INTRAVENOUS
  Administered 2018-05-12: 30 ug/min via INTRAVENOUS
  Administered 2018-05-12: 20 ug/min via INTRAVENOUS
  Administered 2018-05-13: 10 ug/min via INTRAVENOUS
  Administered 2018-05-13: 20 ug/min via INTRAVENOUS
  Filled 2018-05-12 (×5): qty 250

## 2018-05-12 MED ORDER — ADULT MULTIVITAMIN LIQUID CH
15.0000 mL | Freq: Every day | ORAL | Status: DC
  Administered 2018-05-12: 15 mL via ORAL
  Filled 2018-05-12 (×3): qty 15

## 2018-05-12 NOTE — Progress Notes (Signed)
eLink Physician-Brief Progress Note Patient Name: Gearlene Godsil DOB: [birth year] MRN: [id number]   Date of Service  05/12/2018  HPI/Events of Note  Request for ABG - Last pH = 7.199.  eICU Interventions  Will order ABG STAT.     Intervention Category Major Interventions: Respiratory failure - evaluation and management  Hattie Pine Eugene 05/12/2018, 2:16 AM

## 2018-05-12 NOTE — Progress Notes (Signed)
eLink Physician-Brief Progress Note Patient Name: Deborah Cooley DOB: 08/27/1956 MRN: [id number]   Date of Service  05/12/2018  HPI/Events of Note  Hypotension - BP = 82/38 with MAP = 52.   eICU Interventions  Will order: 1. ABG now.  2. Phenylephrine IV infusion. Titrate to MAP > 65.      Intervention Category Major Interventions: Hypotension - evaluation and management  Sommer,Steven Eugene 05/12/2018, 1:04 AM

## 2018-05-12 NOTE — Progress Notes (Signed)
HEMATOLOGY/ONCOLOGY INPATIENT PROGRESS NOTE  Date of Service: 05/12/2018  Inpatient Attending: .Chesley Mires, MD   SUBJECTIVE:   Deborah Cooley acknowledges that she is doing alright, and is currently intubated but was awake and able to answer yes/no questions with nods. Since our last visit last AM she apparently developed sudden respiratory distress and hypoxia requiring intubation. I discussed with hospitalist and concerns were re-expansion pulmonary edema after large volume thoracentesis. Some concern for aspiration??.  Lab results today (05/12/18) of CBC, CMP, and Reticulocytes is as follows: all values are WNL except for WBC at 16.3k, RBC at 3.37, HGB at 9.8, HCT at 29.8, RDW at 16.1, PLT at 84k.  On review of systems, pt reports leg swelling and denies abdominal pains.    OBJECTIVE:  NAD  PHYSICAL EXAMINATION: . Vitals:   05/13/18 0400 05/13/18 0600 05/13/18 0728 05/13/18 0737  BP:      Pulse:      Resp: 20  (!) 31   Temp:      TempSrc:      SpO2: 98%  98% 99%  Weight:  139 lb 15.9 oz (63.5 kg)    Height:       Filed Weights   05/10/18 0722 05/11/18 0534 05/12/18 0500  Weight: 148 lb (67.1 kg) 146 lb 2.6 oz (66.3 kg) 144 lb 2.9 oz (65.4 kg)   .Body mass index is 26.53 kg/m.  GENERAL:alert,  Intubated, on ventilator SKIN: no acute rashes EYES: conjunctiva are pink and non-injected, sclera anicteric OROPHARYNX: MMM, no exudates, no oropharyngeal erythema or ulceration NECK: supple, no JVD LYMPH:  no palpable lymphadenopathy in the cervical, axillary or inguinal regions LUNGS: decreased air entry b/l lung bases, coarse breath sounds b/l HEART: regular rate & rhythm ABDOMEN:  normoactive bowel sounds, abdomen mildly distended, no TTP. Extremity: 2+ pedal edema PSYCH: alert & oriented x 3 with fluent speech NEURO: no focal motor/sensory deficits   MEDICAL HISTORY:  Past Medical History:  Diagnosis Date  . Bilateral pleural effusion 04/17/2018  .  Endometriosis   . Essential hypertension, benign   . Family history of adverse reaction to anesthesia    "sister stops breathing I think" (04/18/2018)  . Heart murmur   . Mixed hyperlipidemia   . Pneumonia 04/17/2018    SURGICAL HISTORY: Past Surgical History:  Procedure Laterality Date  . ABDOMINAL HYSTERECTOMY  1970s/1980s   for endometriosis  . Arthroscopic right shoulder surgery    . AXILLARY LYMPH NODE BIOPSY Left 04/21/2018   Procedure: LEFT AXILLARY LYMPH NODE BIOPSY;  Surgeon: Kieth Brightly, Arta Bruce, MD;  Location: [facility];  Service: General;  Laterality: Left;  . CARPAL TUNNEL RELEASE Right   . GANGLION CYST EXCISION Right   . IR IMAGING GUIDED PORT INSERTION  05/07/2018  . MULTIPLE TOOTH EXTRACTIONS    . OOPHORECTOMY  1997  . PARTIAL HYSTERECTOMY  1984  . Right hand surgery    . SHOULDER OPEN ROTATOR CUFF REPAIR Right   . VESICOVAGINAL FISTULA CLOSURE W/ TAH      SOCIAL HISTORY: Social History   Socioeconomic History  . Marital status: Married    Spouse name: Not on file  . Number of children: Not on file  . Years of education: Not on file  . Highest education level: Not on file  Occupational History  . Occupation: Social research officer, government    Comment: works at [employer]  . Financial resource strain: Not on file  . Food insecurity:    Worry:  Not on file    Inability: Not on file  . Transportation needs:    Medical: Not on file    Non-medical: Not on file  Tobacco Use  . Smoking status: Current Every Day Smoker    Packs/day: 1.50    Years: 46.00    Pack years: 69.00    Types: Cigarettes  . Smokeless tobacco: Never Used  Substance and Sexual Activity  . Alcohol use: Not Currently    Comment: 04/18/2018 "quit years ago"  . Drug use: Not Currently    Comment: "back in my 42s"  . Sexual activity: Not Currently  Lifestyle  . Physical activity:    Days per week: Not on file    Minutes per session: Not on file  . Stress: Not on file    Relationships  . Social connections:    Talks on phone: Not on file    Gets together: Not on file    Attends religious service: Not on file    Active member of club or organization: Not on file    Attends meetings of clubs or organizations: Not on file    Relationship status: Not on file  . Intimate partner violence:    Fear of current or ex partner: Not on file    Emotionally abused: Not on file    Physically abused: Not on file    Forced sexual activity: Not on file  Other Topics Concern  . Not on file  Social History Narrative   ** Merged History Encounter **       Patient has one son whom is healthy.   Has a live in boyfriend.    FAMILY HISTORY: Family History  Problem Relation Age of Onset  . Diabetes Mother        age 37  . Heart attack Father        died age 55's  . Hypertension Father   . Cancer Sister        breast cancer diagonsed age 37 years  . Diabetes Unknown   . Heart disease Unknown        Female <55  . Arthritis Unknown   . Asthma Unknown   . Hyperlipidemia Other        several siblings  . Thyroid disease Sister        one sister with thyroid disease    ALLERGIES:  has No Known Allergies.  MEDICATIONS:  Scheduled Meds: . allopurinol  300 mg Per Tube Daily  . chlorhexidine gluconate (MEDLINE KIT)  15 mL Mouth Rinse BID  . Chlorhexidine Gluconate Cloth  6 each Topical Daily  . enoxaparin (LOVENOX) injection  40 mg Subcutaneous Q24H  . feeding supplement (PRO-STAT SUGAR FREE 64)  30 mL Per Tube BID  . feeding supplement (VITAL HIGH PROTEIN)  1,000 mL Per Tube Q24H  . insulin aspart  0-20 Units Subcutaneous Q4H  . ipratropium-albuterol  3 mL Nebulization Q6H  . mouth rinse  15 mL Mouth Rinse 10 times per day  . pantoprazole sodium  40 mg Per Tube Daily  . potassium chloride  40 mEq Per Tube Once  . sodium chloride flush  10-40 mL Intracatheter Q12H  . sodium chloride flush  3 mL Intravenous Q12H  . Tbo-filgastrim (GRANIX) SQ  480 mcg  Subcutaneous q1800   Continuous Infusions: . sodium chloride    . sodium chloride    . ampicillin-sulbactam (UNASYN) IV 3 g (05/12/18 1761)  . phenylephrine (NEO-SYNEPHRINE) Adult infusion 40 mcg/min (05/12/18  8466)  . propofol (DIPRIVAN) infusion 5 mcg/kg/min (05/12/18 0901)   PRN Meds:.Place/Maintain arterial line **AND** sodium chloride, acetaminophen **OR** acetaminophen, fentaNYL (SUBLIMAZE) injection, fentaNYL (SUBLIMAZE) injection, heparin lock flush, polyethylene glycol, prochlorperazine, sodium chloride flush, sodium chloride flush, sodium chloride flush  REVIEW OF SYSTEMS:    A 10+ POINT REVIEW OF SYSTEMS WAS OBTAINED including neurology, dermatology, psychiatry, cardiac, respiratory, lymph, extremities, GI, GU, Musculoskeletal, constitutional, breasts, reproductive, HEENT.  All pertinent positives are noted in the HPI.  All others are negative.    LABORATORY DATA:  I have reviewed the data as listed  . CBC Latest Ref Rng & Units 05/12/2018 05/11/2018 05/10/2018  WBC 4.0 - 10.5 K/uL 16.3(H) 9.5 3.0(L)  Hemoglobin 12.0 - 15.0 g/dL 9.8(L) 8.9(L) 8.9(L)  Hematocrit 36.0 - 46.0 % 29.8(L) 26.8(L) 27.3(L)  Platelets 150 - 400 K/uL 84(L) 99(L) 106(L)    . CMP Latest Ref Rng & Units 05/12/2018 05/11/2018 05/10/2018  Glucose 65 - 99 mg/dL 115(H) 123(H) 234(H)  BUN 6 - 20 mg/dL 26(H) 22(H) 24(H)  Creatinine 0.44 - 1.00 mg/dL 0.50 0.53 0.56  Sodium 135 - 145 mmol/L 141 139 138  Potassium 3.5 - 5.1 mmol/L 3.6 3.9 3.9  Chloride 101 - 111 mmol/L 109 106 107  CO2 22 - 32 mmol/L '29 30 27  ' Calcium 8.9 - 10.3 mg/dL 7.8(L) 8.5(L) 8.1(L)  Total Protein 6.5 - 8.1 g/dL 5.4(L) - 7.1  Total Bilirubin 0.3 - 1.2 mg/dL 0.6 - 0.4  Alkaline Phos 38 - 126 U/L 42 - 48  AST 15 - 41 U/L 23 - 28  ALT 14 - 54 U/L 15 - 19   Component     Latest Ref Rng & Units 05/12/2018  Magnesium     1.7 - 2.4 mg/dL 1.7  Phosphorus     2.5 - 4.6 mg/dL 3.0  Sed Rate     0 - 22 mm/hr 115 (H)  CRP     <1.0 mg/dL  5.2 (H)    RADIOGRAPHIC STUDIES: I have personally reviewed the radiological images as listed and agreed with the findings in the report. Dg Chest 1 View  Result Date: 05/11/2018 CLINICAL DATA:  62 y/o F; status post intubation. Recent thoracentesis. EXAM: CHEST  1 VIEW COMPARISON:  05/11/2018 chest radiograph. FINDINGS: Stable right port catheter tip projecting over mid SVC. Endotracheal tube tip projects 2 cm above carina. Enteric tube tip extends below field of view into the abdomen. Transcutaneous pacing pads noted. Increased diffuse hazy opacities of the lungs and stable basilar consolidations. No pneumothorax. Bilateral pleural effusions. Bones are unremarkable. No pneumothorax. IMPRESSION: 1. Endotracheal tube tip 2 cm above carina. 2. Enteric tube tip below field of view in the abdomen. 3. Increased diffuse hazy opacities of the lungs and stable basilar consolidation. Bilateral effusions. Electronically Signed   By: Kristine Garbe M.D.   On: 05/11/2018 13:34   Dg Chest 1 View  Result Date: 05/11/2018 CLINICAL DATA:  Post thoracentesis now with worsening cough, shortness of breath and vomiting. EXAM: CHEST  1 VIEW COMPARISON:  Earlier same day; 05/09/2018; 05/08/2018 FINDINGS: Interval development of recurrent right basilar consolidative suspected airspace opacities now with partial obscuration of the right heart border. Otherwise, grossly unchanged cardiac silhouette and mediastinal contours with atherosclerotic plaque when the thoracic aorta. Unchanged small to moderate size left-sided effusion associated left basilar consolidative opacities. Pulmonary vasculature remains distinct with cephalization of flow. No pneumothorax. Stable position of support apparatus. Known peripherally calcified thyroid nodule overlies the thoracic inlet. No  acute osseus abnormalities. IMPRESSION: 1. Interval development of recurrent right basilar consolidative airspace opacities post recent large volume  right-sided thoracentesis - differential considerations are broad though given provided history of vomiting, findings are concerning for aspiration with additional differential considerations including re-expansion alveolar pulmonary edema and (less likely) pulmonary hemorrhage. 2. Grossly unchanged small to moderate size left-sided effusion. 3. Persistent findings of pulmonary venous congestion/mild pulmonary edema. Above findings discussed with rapid response team at the time of image acquisition. Electronically Signed   By: Sandi Mariscal M.D.   On: 05/11/2018 11:50   Dg Chest 1 View  Result Date: 05/11/2018 CLINICAL DATA:  Post right-sided thoracentesis EXAM: CHEST  1 VIEW COMPARISON:  05/09/2018 FINDINGS: Interval reduction/near resolution of residual trace right-sided effusion post thoracentesis. No pneumothorax. No change to slight increase in small to moderate size left-sided effusion. Improved aeration of lung base with persistent right basilar heterogeneous opacities. Unchanged left basilar consolidative opacities. Pulmonary vasculature remains indistinct with cephalization of flow and nodular thickening of the pulmonary interstitium. Grossly unchanged cardiac silhouette and mediastinal contours with atherosclerotic plaque within the thoracic aorta. Calcification overlying the right-sided the thoracic inlet is unchanged and compatible with known peripherally calcified thyroid nodule. Stable position of support apparatus. No acute osseus abnormalities. IMPRESSION: 1. Interval reduction/near resolution of residual trace effusion post thoracentesis. No pneumothorax. 2. No change to slight increase in small to moderate size left-sided pleural effusion. 3. Overall improved aeration of lungs with persistent findings of pulmonary venous congestion/mild pulmonary edema. Electronically Signed   By: Sandi Mariscal M.D.   On: 05/11/2018 10:44   Dg Chest 1 View  Result Date: 05/08/2018 CLINICAL DATA:  Wheezing EXAM:  CHEST  1 VIEW COMPARISON:  Chest x-ray of 04/23/2017 FINDINGS: There has been increase in volume of right pleural effusion with increasing right basilar atelectasis. A smaller left pleural effusion remains with probable partial atelectasis at the left lung base as well. The heart is mildly enlarged. A Port-A-Cath is now present with the tip overlying the mid lower SVC. No pneumothorax is seen. No bony abnormality is noted. IMPRESSION: 1. Increase in volume of right pleural effusion with increasing right basilar atelectasis. 2. Small left pleural effusion remains with probable partial atelectasis of the left lung base as well. 3. Port-A-Cath tip overlies the mid lower SVC. Electronically Signed   By: Ivar Drape M.D.   On: 05/08/2018 11:51   Dg Chest 2 View  Result Date: 05/09/2018 CLINICAL DATA:  Short of breath for 2 days. Cough yesterday but not today. Followup pleural effusion. EXAM: CHEST - 2 VIEW COMPARISON:  07/08/2018 and older exams. FINDINGS: Moderate to large right pleural effusion obscures the right hemidiaphragm and right heart border. Small to moderate left pleural effusion obscures the left hemidiaphragm. There are prominent vascular markings bilaterally without evidence of pulmonary edema. No evidence of pneumonia. No pneumothorax. Cardiac silhouette is normal in size. No mediastinal or hilar masses. Right anterior chest wall Port-A-Cath is stable. IMPRESSION: 1. Moderate to large right and small to moderate left pleural effusions similar to the previous day's study. No evidence of pneumonia or pulmonary edema. Electronically Signed   By: Lajean Manes M.D.   On: 05/09/2018 15:02   Dg Chest 2 View  Result Date: 04/23/2018 CLINICAL DATA:  Recent thoracentesis, worsening shortness of breath would like swelling. Productive cough. History of pleural effusion, pneumonia, left axillary node biopsy April 21, 2018. EXAM: CHEST - 2 VIEW COMPARISON:  Chest radiograph April 19, 2018 and CT chest  Apr 18, 2018  FINDINGS: Increasing small bilateral pleural effusions. Diffuse interstitial prominence. Increased lung volumes and flattened hemidiaphragms. Worsening patchy right middle lobe airspace opacity. Left lung base atelectasis. Cardiac silhouette mildly enlarged. Calcified aortic arch. No pneumothorax. Calcified right thyroid nodule. Soft tissue planes and included osseous structures are not suspicious. IMPRESSION: Increasing small bilateral pleural effusions. Worsening right middle lobe airspace opacity with left lung base atelectasis. Mild cardiomegaly.  COPD. Aortic Atherosclerosis (ICD10-I70.0). Electronically Signed   By: Elon Alas M.D.   On: 04/23/2018 21:15   Dg Chest 2 View  Result Date: 04/19/2018 CLINICAL DATA:  Productive cough and shortness of breath for 1 week. EXAM: CHEST - 2 VIEW COMPARISON:  PA and lateral chest 04/17/2018. CT chest, abdomen and pelvis 04/18/2018. FINDINGS: Small bilateral pleural effusions are seen, greater on the right. Basilar atelectasis is noted. No consolidative process or pneumothorax. Heart size is normal. Aortic atherosclerosis is noted. Calcified lesion in the right lobe of the thyroid is noted. No acute bony abnormality. IMPRESSION: Small bilateral pleural effusions and mild basilar atelectasis. Atherosclerosis. Electronically Signed   By: Inge Rise M.D.   On: 04/19/2018 13:55   Dg Chest 2 View  Result Date: 04/17/2018 CLINICAL DATA:  Abdominal pain.  Shortness of breath. EXAM: CHEST - 2 VIEW COMPARISON:  None. FINDINGS: The heart size is normal. The hila and mediastinum are unremarkable. A 2.2 cm eggshell calcification is seen in the right thoracic inlet. No pneumothorax. Mild interstitial prominence bilaterally. Small bilateral pleural effusions. No nodules or masses. No other acute abnormalities. IMPRESSION: 1. Mild interstitial opacities in the lungs are nonspecific. Edema could have this appearance but the lack of cardiomegaly would be unusual.  Atypical infection is possible. 2. Small bilateral effusions. 3. The eggshell calcification in the right thoracic inlet is nonspecific. This could be a vascular structure or a calcified nodule in the thyroid. Electronically Signed   By: Dorise Bullion III M.D   On: 04/17/2018 23:32   Ct Angio Chest Pe W/cm &/or Wo Cm  Addendum Date: 04/18/2018   ADDENDUM REPORT: 04/18/2018 08:59 ADDENDUM: Add to IMPRESSION: There is wall thickening in the urinary bladder with mild soft tissue stranding adjacent to the bladder. Suspect a degree of cystitis. Electronically Signed   By: Lowella Grip III M.D.   On: 04/18/2018 08:59   Result Date: 04/18/2018 CLINICAL DATA:  Shortness of breath.  Abdominal pain EXAM: CT ANGIOGRAPHY CHEST CT ABDOMEN AND PELVIS WITH CONTRAST TECHNIQUE: Multidetector CT imaging of the chest was performed using the standard protocol during bolus administration of intravenous contrast. Multiplanar CT image reconstructions and MIPs were obtained to evaluate the vascular anatomy. Multidetector CT imaging of the abdomen and pelvis was performed using the standard protocol during bolus administration of intravenous contrast. CONTRAST:  14m ISOVUE-370 IOPAMIDOL (ISOVUE-370) INJECTION 76% COMPARISON:  Chest radiograph Apr 17, 2018 FINDINGS: CTA CHEST FINDINGS Cardiovascular: There is no demonstrable pulmonary embolus. There is no thoracic aortic aneurysm or dissection. There are foci of calcification in the proximal visualized great vessels. There is atherosclerotic calcification in the thoracic aorta. There are scattered foci of coronary artery calcification. There is a small pericardial effusion. Mediastinum/Nodes: There is a partially calcified mass arising from the right lobe of the thyroid measuring 2.7 x 1.9 cm. Thyroid otherwise appears unremarkable. There is extensive adenopathy throughout the chest. There are multiple supraclavicular lymph nodes, largest measuring 1.0 x 0.9 cm. There are  multiple axillary lymph nodes. The largest axillary lymph node on the left  measures 2.5 x 2.1 cm cm. The largest axillary lymph node on the right measures 2.3 x 2.0 cm. There is adenopathy throughout the mediastinum. There are multiple aortopulmonary window region lymph nodes, largest measuring 1.4 x 1.4 cm. There is extensive adenopathy in the mediastinum surrounding the trachea and carina. There is a right pretracheal lymph node measuring 1.9 x 1.8 cm. There is a lymph node to the left of the origin of the carina measuring 1.6 x 1.5 cm. There is extensive adenopathy in the right hilar region. The largest individual lymph node in the right hilum measures 2.2 x 1.4 cm. There are multiple left hilar lymph nodes. The largest left hilar lymph node measures 1.8 x 1.3 cm. There is extensive subcarinal adenopathy. Largest subcarinal lymph node measures 2.8 x 2.4 cm. There are retrocrural lymph nodes bilaterally, largest on the left measuring 1.3 x 1.0 cm. There are several small lymph nodes at the level of the right pericardiophrenic angle. No esophageal lesions are evident. Lungs/Pleura: There are free-flowing pleural effusions bilaterally, larger on the right than on the left. There is consolidation in both lung bases, likely primarily due to compressive atelectasis. There is atelectatic change with volume loss in the right middle lobe medially with focal consolidation adjacent to the right heart border in the medial segment right middle lobe. No pulmonary nodular type lesion is evident on this study. Musculoskeletal: There are no blastic or lytic bone lesions. No chest wall lesions are evident. Several benign-appearing calcifications are noted in the right breast. Review of the MIP images confirms the above findings. CT ABDOMEN and PELVIS FINDINGS Hepatobiliary: No focal liver lesions are appreciable. There is no appreciable gallbladder wall thickening. No biliary duct dilatation. Pancreas: No pancreatic mass or  inflammatory focus. Extensive peripancreatic adenopathy is noted. Spleen: Spleen measures 15.0 x 12.4 x 6.8 cm with a measured splenic volume of 632 cubic cm. No focal splenic lesions are evident. Adrenals/Urinary Tract: Adrenals bilaterally appear unremarkable. Kidneys bilaterally show no evident mass or hydronephrosis. No renal or ureteral calculus. No hydronephrosis. Urinary bladder is midline with thickening of the urinary bladder wall. There is also mild soft tissue stranding surrounding the urinary bladder. Stomach/Bowel: There is no appreciable bowel wall or mesenteric thickening. There is no evident bowel obstruction. No free air or portal venous air. Vascular/Lymphatic: There is atherosclerotic calcification throughout the aorta and iliac arteries. There is moderate narrowing of the aorta with what appears to be hemodynamically significant obstruction in both common femoral arteries. No aneurysm. Major mesenteric arterial vessels appear patent. There is extensive adenopathy throughout the abdomen. There are multiple enlarged lymph nodes in the peripancreatic region, largest measuring 1.7 x 1.5 cm. Scattered prominent mesenteric lymph nodes are noted throughout the abdomen. There is extensive retroperitoneal adenopathy. Largest retroperitoneal lymph node measures 2.5 x 1.5 cm. There is adenopathy to the right of the celiac axis measuring 2.3 x 1.5 cm. Adenopathy is seen in both external iliac node chains. The largest lymph node in this area on the right measures 2.6 x 1.7 cm. The largest lymph node in this region on the left measures 2.0 by 1.4 cm. There is also extensive adenopathy more posteriorly in the pelvis at the level of each acetabulum. The largest of these lymph nodes on the right measures 3.1 x 1.7 cm. Largest of these lymph nodes on the left measures 3.2 x 1.6 cm. There is adenopathy in both inguinal regions. Largest of these lymph nodes in the inguinal regions on the right measures  1.4 x 1.3 cm.  Largest lymph node in the left inguinal region measures 1.8 x 1.6 cm. Reproductive: Uterus is apparently absent. No pelvic mass apart from adenopathy seen. Other: Appendix region appears normal. No abscess is evident in the abdomen or pelvis. There is mild ascites. Musculoskeletal: There are no appreciable blastic or lytic bone lesions. No intramuscular or abdominal wall lesions are evident. Review of the MIP images confirms the above findings. IMPRESSION: CT angiogram chest: 1. No demonstrable pulmonary embolus. No thoracic aortic aneurysm or dissection. Multiple foci of aortic atherosclerosis noted as well as foci of calcification in great vessels and coronary arteries. 2. Extensive adenopathy at multiple sites as summarized above. This extensive adenopathy raises concern for potential lymphoma. Small cell lung carcinoma could present in this manner as well. 3. Moderate pleural effusions bilaterally, larger on the right than on the left, with compressive atelectasis in the lung bases. There is focal consolidation felt to represent pneumonia adjacent to the right heart border in the medial segment right middle lobe. 4. Partially calcified dominant mass right lobe of thyroid. This mass may warrant nonemergent thyroid ultrasound to further evaluate. CT abdomen and pelvis: 1. Widespread abdominal and pelvic adenopathy. Splenomegaly. This combination of findings raises concern for lymphoma as most likely etiology. 2.  Mild ascites. 3. No abscess in the abdomen or pelvis. No periappendiceal region inflammation. 4. Extent extensive aortoiliac atherosclerosis with what appears to be hemodynamically significant obstruction in the common iliac arteries bilaterally. 5.  Uterus apparently absent. Aortic Atherosclerosis (ICD10-I70.0). Electronically Signed: By: Lowella Grip III M.D. On: 04/18/2018 08:41   Ct Abdomen Pelvis W Contrast  Addendum Date: 04/18/2018   ADDENDUM REPORT: 04/18/2018 08:59 ADDENDUM: Add to  IMPRESSION: There is wall thickening in the urinary bladder with mild soft tissue stranding adjacent to the bladder. Suspect a degree of cystitis. Electronically Signed   By: Lowella Grip III M.D.   On: 04/18/2018 08:59   Result Date: 04/18/2018 CLINICAL DATA:  Shortness of breath.  Abdominal pain EXAM: CT ANGIOGRAPHY CHEST CT ABDOMEN AND PELVIS WITH CONTRAST TECHNIQUE: Multidetector CT imaging of the chest was performed using the standard protocol during bolus administration of intravenous contrast. Multiplanar CT image reconstructions and MIPs were obtained to evaluate the vascular anatomy. Multidetector CT imaging of the abdomen and pelvis was performed using the standard protocol during bolus administration of intravenous contrast. CONTRAST:  175m ISOVUE-370 IOPAMIDOL (ISOVUE-370) INJECTION 76% COMPARISON:  Chest radiograph Apr 17, 2018 FINDINGS: CTA CHEST FINDINGS Cardiovascular: There is no demonstrable pulmonary embolus. There is no thoracic aortic aneurysm or dissection. There are foci of calcification in the proximal visualized great vessels. There is atherosclerotic calcification in the thoracic aorta. There are scattered foci of coronary artery calcification. There is a small pericardial effusion. Mediastinum/Nodes: There is a partially calcified mass arising from the right lobe of the thyroid measuring 2.7 x 1.9 cm. Thyroid otherwise appears unremarkable. There is extensive adenopathy throughout the chest. There are multiple supraclavicular lymph nodes, largest measuring 1.0 x 0.9 cm. There are multiple axillary lymph nodes. The largest axillary lymph node on the left measures 2.5 x 2.1 cm cm. The largest axillary lymph node on the right measures 2.3 x 2.0 cm. There is adenopathy throughout the mediastinum. There are multiple aortopulmonary window region lymph nodes, largest measuring 1.4 x 1.4 cm. There is extensive adenopathy in the mediastinum surrounding the trachea and carina. There is a  right pretracheal lymph node measuring 1.9 x 1.8 cm. There is a lymph  node to the left of the origin of the carina measuring 1.6 x 1.5 cm. There is extensive adenopathy in the right hilar region. The largest individual lymph node in the right hilum measures 2.2 x 1.4 cm. There are multiple left hilar lymph nodes. The largest left hilar lymph node measures 1.8 x 1.3 cm. There is extensive subcarinal adenopathy. Largest subcarinal lymph node measures 2.8 x 2.4 cm. There are retrocrural lymph nodes bilaterally, largest on the left measuring 1.3 x 1.0 cm. There are several small lymph nodes at the level of the right pericardiophrenic angle. No esophageal lesions are evident. Lungs/Pleura: There are free-flowing pleural effusions bilaterally, larger on the right than on the left. There is consolidation in both lung bases, likely primarily due to compressive atelectasis. There is atelectatic change with volume loss in the right middle lobe medially with focal consolidation adjacent to the right heart border in the medial segment right middle lobe. No pulmonary nodular type lesion is evident on this study. Musculoskeletal: There are no blastic or lytic bone lesions. No chest wall lesions are evident. Several benign-appearing calcifications are noted in the right breast. Review of the MIP images confirms the above findings. CT ABDOMEN and PELVIS FINDINGS Hepatobiliary: No focal liver lesions are appreciable. There is no appreciable gallbladder wall thickening. No biliary duct dilatation. Pancreas: No pancreatic mass or inflammatory focus. Extensive peripancreatic adenopathy is noted. Spleen: Spleen measures 15.0 x 12.4 x 6.8 cm with a measured splenic volume of 632 cubic cm. No focal splenic lesions are evident. Adrenals/Urinary Tract: Adrenals bilaterally appear unremarkable. Kidneys bilaterally show no evident mass or hydronephrosis. No renal or ureteral calculus. No hydronephrosis. Urinary bladder is midline with  thickening of the urinary bladder wall. There is also mild soft tissue stranding surrounding the urinary bladder. Stomach/Bowel: There is no appreciable bowel wall or mesenteric thickening. There is no evident bowel obstruction. No free air or portal venous air. Vascular/Lymphatic: There is atherosclerotic calcification throughout the aorta and iliac arteries. There is moderate narrowing of the aorta with what appears to be hemodynamically significant obstruction in both common femoral arteries. No aneurysm. Major mesenteric arterial vessels appear patent. There is extensive adenopathy throughout the abdomen. There are multiple enlarged lymph nodes in the peripancreatic region, largest measuring 1.7 x 1.5 cm. Scattered prominent mesenteric lymph nodes are noted throughout the abdomen. There is extensive retroperitoneal adenopathy. Largest retroperitoneal lymph node measures 2.5 x 1.5 cm. There is adenopathy to the right of the celiac axis measuring 2.3 x 1.5 cm. Adenopathy is seen in both external iliac node chains. The largest lymph node in this area on the right measures 2.6 x 1.7 cm. The largest lymph node in this region on the left measures 2.0 by 1.4 cm. There is also extensive adenopathy more posteriorly in the pelvis at the level of each acetabulum. The largest of these lymph nodes on the right measures 3.1 x 1.7 cm. Largest of these lymph nodes on the left measures 3.2 x 1.6 cm. There is adenopathy in both inguinal regions. Largest of these lymph nodes in the inguinal regions on the right measures 1.4 x 1.3 cm. Largest lymph node in the left inguinal region measures 1.8 x 1.6 cm. Reproductive: Uterus is apparently absent. No pelvic mass apart from adenopathy seen. Other: Appendix region appears normal. No abscess is evident in the abdomen or pelvis. There is mild ascites. Musculoskeletal: There are no appreciable blastic or lytic bone lesions. No intramuscular or abdominal wall lesions are evident. Review of  the MIP images confirms the above findings. IMPRESSION: CT angiogram chest: 1. No demonstrable pulmonary embolus. No thoracic aortic aneurysm or dissection. Multiple foci of aortic atherosclerosis noted as well as foci of calcification in great vessels and coronary arteries. 2. Extensive adenopathy at multiple sites as summarized above. This extensive adenopathy raises concern for potential lymphoma. Small cell lung carcinoma could present in this manner as well. 3. Moderate pleural effusions bilaterally, larger on the right than on the left, with compressive atelectasis in the lung bases. There is focal consolidation felt to represent pneumonia adjacent to the right heart border in the medial segment right middle lobe. 4. Partially calcified dominant mass right lobe of thyroid. This mass may warrant nonemergent thyroid ultrasound to further evaluate. CT abdomen and pelvis: 1. Widespread abdominal and pelvic adenopathy. Splenomegaly. This combination of findings raises concern for lymphoma as most likely etiology. 2.  Mild ascites. 3. No abscess in the abdomen or pelvis. No periappendiceal region inflammation. 4. Extent extensive aortoiliac atherosclerosis with what appears to be hemodynamically significant obstruction in the common iliac arteries bilaterally. 5.  Uterus apparently absent. Aortic Atherosclerosis (ICD10-I70.0). Electronically Signed: By: Lowella Grip III M.D. On: 04/18/2018 08:41   Dg Chest Port 1 View  Result Date: 05/12/2018 CLINICAL DATA:  Acute respiratory failure EXAM: PORTABLE CHEST 1 VIEW COMPARISON:  May 11, 2018 FINDINGS: The ETT terminates 16 mm above the carina. Recommend withdrawing 1 cm. The right Port-A-Cath is stable. The NG tube terminates below today's film. Layering effusion with underlying opacity on the left is similar in the interval. Focal infiltrate in the right lower lobe is stable. There may also be pulmonary edema. No other changes. IMPRESSION: 1. The ETT  terminates 1.6 cm above the carina. Recommend withdrawing 1 cm. Other support apparatus as above. 2. Effusion and underlying opacity on the left is stable to mildly worsened. Focal infiltrate on the right is stable. Probable superimposed edema. Electronically Signed   By: Dorise Bullion III M.D   On: 05/12/2018 07:32   Ct Bone Marrow Biopsy & Aspiration  Result Date: 05/05/2018 INDICATION: Lymphoma EXAM: CT BONE MARROW BIOPSY AND ASPIRATION MEDICATIONS: None. ANESTHESIA/SEDATION: Fentanyl 75 mcg IV; Versed 2 mg IV Moderate Sedation Time:  32 minutes The patient was continuously monitored during the procedure by the interventional radiology nurse under my direct supervision. FLUOROSCOPY TIME:  Fluoroscopy Time:  minutes  seconds ( mGy). COMPLICATIONS: None immediate. PROCEDURE: Informed written consent was obtained from the patient after a thorough discussion of the procedural risks, benefits and alternatives. All questions were addressed. Maximal Sterile Barrier Technique was utilized including caps, mask, sterile gowns, sterile gloves, sterile drape, hand hygiene and skin antiseptic. A timeout was performed prior to the initiation of the procedure. Under CT guidance, a(n) 11 gauge guide needle was advanced into the right iliac bone. Aspirates and a core were obtained. Post biopsy images demonstrate . Patient tolerated the procedure well without complication. Vital sign monitoring by nursing staff during the procedure will continue as patient is in the special procedures unit for post procedure observation. FINDINGS: The images document guide needle placement within the right iliac bone. Post biopsy images demonstrate no hemorrhage. IMPRESSION: Successful CT-guided right iliac bone marrow aspirate and core. Electronically Signed   By: Marybelle Killings M.D.   On: 05/05/2018 13:33   Ir Imaging Guided Port Insertion  Result Date: 05/07/2018 INDICATION: 62 year old female with a history of lymphoma. She has been  referred for port catheter. EXAM: IMPLANTED PORT A CATH  PLACEMENT WITH ULTRASOUND AND FLUOROSCOPIC GUIDANCE MEDICATIONS: 2 g Ancef; The antibiotic was administered within an appropriate time interval prior to skin puncture. ANESTHESIA/SEDATION: Moderate (conscious) sedation was employed during this procedure. A total of Versed mg and Fentanyl 100 mcg was administered intravenously. Moderate Sedation Time: 20 minutes. The patient's level of consciousness and vital signs were monitored continuously by radiology nursing throughout the procedure under my direct supervision. FLUOROSCOPY TIME:  0 minutes, 6 seconds (1.5 mGy) COMPLICATIONS: None PROCEDURE: The procedure, risks, benefits, and alternatives were explained to the patient. Questions regarding the procedure were encouraged and answered. The patient understands and consents to the procedure. Ultrasound survey was performed with images stored and sent to PACs. The right neck and chest was prepped with chlorhexidine, and draped in the usual sterile fashion using maximum barrier technique (cap and mask, sterile gown, sterile gloves, large sterile sheet, hand hygiene and cutaneous antiseptic). Antibiotic prophylaxis was provided with 2.0g Ancef administered IV one hour prior to skin incision. Local anesthesia was attained by infiltration with 1% lidocaine without epinephrine. Ultrasound demonstrated patency of the right internal jugular vein, and this was documented with an image. Under real-time ultrasound guidance, this vein was accessed with a 21 gauge micropuncture needle and image documentation was performed. A small dermatotomy was made at the access site with an 11 scalpel. A 0.018" wire was advanced into the SVC and used to estimate the length of the internal catheter. The access needle exchanged for a 29F micropuncture vascular sheath. The 0.018" wire was then removed and a 0.035" wire advanced into the IVC. An appropriate location for the subcutaneous  reservoir was selected below the clavicle and an incision was made through the skin and underlying soft tissues. The subcutaneous tissues were then dissected using a combination of blunt and sharp surgical technique and a pocket was formed. A single lumen power injectable portacatheter was then tunneled through the subcutaneous tissues from the pocket to the dermatotomy and the port reservoir placed within the subcutaneous pocket. The venous access site was then serially dilated and a peel away vascular sheath placed over the wire. The wire was removed and the port catheter advanced into position under fluoroscopic guidance. The catheter tip is positioned in the cavoatrial junction. This was documented with a spot image. The portacatheter was then tested and found to flush and aspirate well. The port was flushed with saline and Huber needle was left in place at the completion. The pocket was then closed in two layers using first subdermal inverted interrupted absorbable sutures followed by a running subcuticular suture. The epidermis was then sealed with Dermabond. The dermatotomy at the venous access site was also seal with Dermabond and Steri-Strips. Patient tolerated the procedure well and remained hemodynamically stable throughout. No complications encountered and no significant blood loss encountered . IMPRESSION: Status post right IJ port catheter placement. Catheter ready for use. Signed, Dulcy Fanny. Dellia Nims, RPVI [facility] Radiology Electronically Signed   By: Corrie Mckusick D.O.   On: 05/07/2018 17:19   US Thoracentesis Asp Pleural Space W/img Guide  Result Date: 05/11/2018 INDICATION: Symptomatic right sided pleural effusion. Please perform ultrasound-guided thoracentesis for therapeutic purposes. EXAM: US THORACENTESIS ASP PLEURAL SPACE W/IMG GUIDE COMPARISON:  Chest CT-04/18/2018; chest radiograph-05/09/2018 MEDICATIONS: None. COMPLICATIONS: None  immediate. TECHNIQUE: Informed written consent was obtained from the patient after a discussion of the risks, benefits and alternatives to treatment. A timeout was performed prior to the initiation of the procedure. Initial ultrasound  scanning demonstrates a large anechoic right-sided pleural effusion. The lower chest was prepped and draped in the usual sterile fashion. 1% lidocaine was used for local anesthesia. An ultrasound image was saved for documentation purposes. An 8 Fr Safe-T-Centesis catheter was introduced. The thoracentesis was performed. The catheter was removed and a dressing was applied. The patient tolerated the procedure well without immediate post procedural complication. The patient was escorted to have an upright chest radiograph. FINDINGS: A total of approximately 1.7 liters of serous fluid was removed. IMPRESSION: Successful ultrasound-guided right sided thoracentesis yielding 1.7 liters of pleural fluid. Electronically Signed   By: Sandi Mariscal M.D.   On: 05/11/2018 10:45    ASSESSMENT & PLAN:  62 y.o. 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 S/P c1 OF CHOP on 05/08/2018  2. General LNadenopathy from lymphoma with b/l pleural effusion and b/l lower extremity weakness  3. Anemia/thrombocytopenia -  from Bone marrow involvement by lymphoma BM Bx confirms involved by T cell lymphoma. Hgb upto 9.6 post transfusion with patient feeling much better.  4. 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 LDH wnl no evidnece of overt hemolysis at this timne   5. CMV IgM+ --  Component     Latest Ref Rng & Units 05/09/2018  CMV Quant DNA PCR (Plasma)     Negative IU/mL 8,430  Log10 CMV Qn DNA Pl     log10 IU/mL 3.926    6. High risk for TLS - on allopurinol , received Rasburicase. Uric acid improving down to 3.8 Uric acid 16--->8--> 4.6  7. Port a cath in situ  8. Large rt pleural effusion related to  lymphoma (AITCL) CXR 6/21 s/p rpt therapeutic thoracentesis on 05/11/2018  9. Acute hypoxic respiratory failure - requiring intubation ?etiology Re-expansion pulmonary edema post large volume thoracentesis ?aspiration Though the rapidity of presentation is unusual would need to consider HCAP and CMV pneumonitis (CMV IgM+ and PCV positive with worsening). Less likely non cardiogenic edema from G-CSF -elevated procalcitonin PLAN -appreciate hospitalist/CCM input -consider empiric abx for pneumonia considering elevated procalcitonin levels -diuretics per fluid status -rpt CMV PCR quantitative to evaluate for increasing titers. -would recommend ID consultation to weight in on CMV findings and need for pre-emptive/treatment considerations of CMV -daily CBC/diff -will hold granix at this time. -complete 5 days of prednisone per CHOP regimen.  . The total time spent in the appointment was 35 minutes and more than 50% was on counseling and direct patient cares.      Sullivan Lone MD MS AAHIVMS [facility] Hematology/Oncology Physician [facility]  (Office):       [phone] (Work cell):  [phone] (Fax):           [phone]  05/12/2018 11:29 AM    I, Baldwin [place], am acting as a Education administrator for Dr Irene Limbo.   .I have reviewed the above documentation for accuracy and completeness, and I agree with the above. Sullivan Lone MD MS

## 2018-05-12 NOTE — Progress Notes (Signed)
Nutrition Follow-up  INTERVENTION:   Initiate tube feeding: - Vital AF 1.2 @ 20 ml/hr and increase by 10 ml/hr every 8 hours until goal rate of 45 ml/hr is reached (1080 ml/day)  - liquid MVI with minerals daily as TF regimen alone does not meet 100% of RDIs  Tube feeding regimen at goal rate provides 1296 kcal, 81 grams of protein, and 875 ml of H2O.   Tube feeding regimen and current propofol provides 1349 total kcal (96% of needs)  NUTRITION DIAGNOSIS:   Inadequate oral intake related to early satiety(lymph node swelling) as evidenced by meal completion < 25%.  Ongoing, pt now NPO  GOAL:   Patient will meet greater than or equal to 90% of their needs  Unmet at this time, will be met with TF at goal rate  MONITOR:   Vent status, TF tolerance, I & O's, Skin, Labs, Weight trends  REASON FOR ASSESSMENT:   Consult Assessment of nutrition requirement/status, Poor PO  ASSESSMENT:   62 yo Female with PMH of HTN, endometriosis s/p hysterectomy, tobacco use, who recently admitted with multifocal lymphadenopathy and concerns for lymphoma, she presents with shortness of breath and difficulty sleeping. Symptoms become worse at night. She continues to exhibit lymphadenopathy concerning for lymphoma upon this admission, also has a calcified mass in R lobe of thyroid.  Pt has now been diagnosed with angioimmunoblastic T-cell lymphoma Stage III-IV.  6/18 - pt transferred to [facility] for Port-A-Cath placement and initiation of chemotherapy with CHOP C1 and G-CSF support 6/19 - Port-A-Cath placed 6/20 - first chemotherapy 6/23 - s/p therapeutic thoracentesis yielding 1.7 L serous pleural fluid, apparent aspiration and intubated for respiratory failure 6/24 - c/s for tube feeding, tube feeding protocol initiated  Meal Completion: 40-100% prior to intubation  Discussed pt with RN. Per RN pt has been experiencing nausea. RD to order TF to start at low rate and increase gradually to  goal rate to improve tolerance.  Patient is currently intubated on ventilator support MV: 7.9 L/min Temp (24hrs), Avg:98.9 F (37.2 C), Min:98.3 F (36.8 C), Max:99.4 F (37.4 C) BP: 115/50 MAP: 72  Propofol: 2 ml/hr (provides 53 kcal/day) Phenylephrine: 60 ml/hr  Medications reviewed and include: sliding scale Novolog, 40 mg Protonix daily, IV antibiotics  Labs reviewed: BUN 26 (H), calcium 7.8 (L), hemoglobin 9.8 (L), HCT 29.8 (L) Phosphorus 2.6 Magnesium 1.7 CBG's: 175, 115, 98, 129, 178, 209, 272 x 24 hours  Diet Order:   Diet Order           Diet NPO time specified  Diet effective now          EDUCATION NEEDS:   No education needs have been identified at this time  Skin:  Skin Assessment: (closed incision to R buttocks)  Last BM:  05/05/2018  Height:   Ht Readings from Last 1 Encounters:  05/09/18 5' 1.81" (1.57 m)    Weight:   Wt Readings from Last 1 Encounters:  05/12/18 144 lb 2.9 oz (65.4 kg)    Ideal Body Weight:  50 kg  BMI:  Body mass index is 26.53 kg/m.  Estimated Nutritional Needs:   Kcal:  1401 kcal/day (PSU 2003b)  Protein:  80-95 grams/day  Fluid:  >/= 1.6 L/day    Gaynell Face, MS, RD, LDN Pager: [phone] Weekend/After Hours: [phone]

## 2018-05-12 NOTE — Addendum Note (Signed)
Addendum  created 05/12/18 0647 by Lollie Sails, CRNA   Charge Capture section accepted

## 2018-05-12 NOTE — Progress Notes (Addendum)
PULMONARY / CRITICAL CARE MEDICINE   Name: Deborah Cooley MRN: [id number] DOB: 08-09-1956    ADMISSION DATE:  04/23/2018 CONSULTATION DATE:  05/11/18  REFERRING MD:  Dr Zigmund Daniel, [facility]  Reason for Consultation:  Acute respiratory failure   HISTORY OF PRESENT ILLNESS:   62 year old woman, history of tobacco, endometriosis, admitted with lymphadenopathy, abdominal discomfort and cough.  Found to have bilateral pleural effusions and was treated for possible community acquired pneumonia.  Right thoracentesis 5/31 showed an atypical lymphocytosis.  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  Her bone marrow biopsy 6/17 confirmed marrow involvement.  Received first cycle of CHOP on 6/20, still completing the prednisone component.  She went for an ultrasound-guided right thoracentesis 6/23, 1.7 L removed.  This was initially well-tolerated with no evidence of pneumothorax.  She experienced an episode of acute dyspnea, respiratory decompensation with hypoxemia following emesis later 6/23.  She moved emergently to the ICU and was ET intubated for acute respiratory failure.  Repeat chest x-ray shows interval right basilar consolidative infiltrate most consistent with an aspiration pneumonia/pneumonitis.  SUBJECTIVE:  Failed spontaneous breathing attempt   VITAL SIGNS: Blood Pressure (Abnormal) 102/43   Pulse 83   Temperature 98.8 F (37.1 C) (Axillary)   Respiration 20   Height 5' 1.81" (1.57 m)   Weight 144 lb 2.9 oz (65.4 kg)   Oxygen Saturation 94%   Body Mass Index 26.53 kg/m   HEMODYNAMICS:    VENTILATOR SETTINGS: Vent Mode: PRVC FiO2 (%):  [50 %-100 %] 50 % Set Rate:  [14 bmp-20 bmp] 20 bmp Vt Set:  [400 mL] 400 mL PEEP:  [5 cmH20-8 cmH20] 8 cmH20 Plateau Pressure:  [18 cmH20-22 cmH20] 18 cmH20  INTAKE / OUTPUT: I/O last 3 completed shifts: In: 2997.1 [P.O.:480; I.V.:234.6; IV Piggyback:2282.4] Out: 985  [Urine:985]  PHYSICAL EXAMINATION: General: This is a pleasant 62 year old white female currently on full ventilator support.  She is in no acute distress and interactive. Neuro: Awake, alert, interactive, follows commands, calm. HEENT normocephalic atraumatic no jugular venous distention orally intubated mucous membranes moist Pulmonary: Scattered rhonchi no accessory use equal chest rise on mechanical vent assisted breath Cardiac: Regular rate and rhythm no murmur rub or gallop Abdomen: Soft nontender Extremities: Dependent edema, warm, dry, brisk cap refill LABS:  BMET Recent Labs  Lab 05/10/18 0952 05/11/18 0500 05/12/18 0500  NA 138 139 141  K 3.9 3.9 3.6  CL 107 106 109  CO2 '27 30 29  ' BUN 24* 22* 26*  CREATININE 0.56 0.53 0.50  GLUCOSE 234* 123* 115*    Electrolytes Recent Labs  Lab 05/07/18 0408  05/10/18 0952 05/11/18 0500 05/12/18 0500  CALCIUM 8.3*   < > 8.1* 8.5* 7.8*  MG 2.3  --  1.9  --   --   PHOS 3.6  --  3.1  --   --    < > = values in this interval not displayed.    CBC Recent Labs  Lab 05/10/18 0952 05/11/18 0500 05/12/18 0500  WBC 3.0* 9.5 16.3*  HGB 8.9* 8.9* 9.8*  HCT 27.3* 26.8* 29.8*  PLT 106* 99* 84*    Coag's Recent Labs  Lab 05/12/18 0500  INR 1.13    Sepsis Markers No results for input(s): LATICACIDVEN, PROCALCITON, O2SATVEN in the last 168 hours.  ABG Recent Labs  Lab 05/11/18 1305 05/12/18 0235  PHART 7.199* 7.455*  PCO2ART 69.3* 38.5  PO2ART 112* 130*    Liver Enzymes Recent Labs  Lab 05/09/18 0650 05/10/18 0952 05/12/18 0500  AST '22 28 23  ' ALT '17 19 15  ' ALKPHOS 46 48 42  BILITOT 0.4 0.4 0.6  ALBUMIN 2.7* 2.6* 1.9*    Cardiac Enzymes No results for input(s): TROPONINI, PROBNP in the last 168 hours.  Glucose Recent Labs  Lab 05/11/18 1340 05/11/18 1548 05/11/18 1924 05/11/18 2318 05/12/18 0309 05/12/18 0743  GLUCAP 272* 209* 178* 129* 98 115*    Imaging Dg Chest 1 View  Result Date:  05/11/2018 CLINICAL DATA:  62 y/o F; status post intubation. Recent thoracentesis. EXAM: CHEST  1 VIEW COMPARISON:  05/11/2018 chest radiograph. FINDINGS: Stable right port catheter tip projecting over mid SVC. Endotracheal tube tip projects 2 cm above carina. Enteric tube tip extends below field of view into the abdomen. Transcutaneous pacing pads noted. Increased diffuse hazy opacities of the lungs and stable basilar consolidations. No pneumothorax. Bilateral pleural effusions. Bones are unremarkable. No pneumothorax. IMPRESSION: 1. Endotracheal tube tip 2 cm above carina. 2. Enteric tube tip below field of view in the abdomen. 3. Increased diffuse hazy opacities of the lungs and stable basilar consolidation. Bilateral effusions. Electronically Signed   By: Kristine Garbe M.D.   On: 05/11/2018 13:34   Dg Chest 1 View  Result Date: 05/11/2018 CLINICAL DATA:  Post thoracentesis now with worsening cough, shortness of breath and vomiting. EXAM: CHEST  1 VIEW COMPARISON:  Earlier same day; 05/09/2018; 05/08/2018 FINDINGS: Interval development of recurrent right basilar consolidative suspected airspace opacities now with partial obscuration of the right heart border. Otherwise, grossly unchanged cardiac silhouette and mediastinal contours with atherosclerotic plaque when the thoracic aorta. Unchanged small to moderate size left-sided effusion associated left basilar consolidative opacities. Pulmonary vasculature remains distinct with cephalization of flow. No pneumothorax. Stable position of support apparatus. Known peripherally calcified thyroid nodule overlies the thoracic inlet. No acute osseus abnormalities. IMPRESSION: 1. Interval development of recurrent right basilar consolidative airspace opacities post recent large volume right-sided thoracentesis - differential considerations are broad though given provided history of vomiting, findings are concerning for aspiration with additional differential  considerations including re-expansion alveolar pulmonary edema and (less likely) pulmonary hemorrhage. 2. Grossly unchanged small to moderate size left-sided effusion. 3. Persistent findings of pulmonary venous congestion/mild pulmonary edema. Above findings discussed with rapid response team at the time of image acquisition. Electronically Signed   By: Sandi Mariscal M.D.   On: 05/11/2018 11:50   Dg Chest 1 View  Result Date: 05/11/2018 CLINICAL DATA:  Post right-sided thoracentesis EXAM: CHEST  1 VIEW COMPARISON:  05/09/2018 FINDINGS: Interval reduction/near resolution of residual trace right-sided effusion post thoracentesis. No pneumothorax. No change to slight increase in small to moderate size left-sided effusion. Improved aeration of lung base with persistent right basilar heterogeneous opacities. Unchanged left basilar consolidative opacities. Pulmonary vasculature remains indistinct with cephalization of flow and nodular thickening of the pulmonary interstitium. Grossly unchanged cardiac silhouette and mediastinal contours with atherosclerotic plaque within the thoracic aorta. Calcification overlying the right-sided the thoracic inlet is unchanged and compatible with known peripherally calcified thyroid nodule. Stable position of support apparatus. No acute osseus abnormalities. IMPRESSION: 1. Interval reduction/near resolution of residual trace effusion post thoracentesis. No pneumothorax. 2. No change to slight increase in small to moderate size left-sided pleural effusion. 3. Overall improved aeration of lungs with persistent findings of pulmonary venous congestion/mild pulmonary edema. Electronically Signed   By: Sandi Mariscal M.D.   On: 05/11/2018 10:44   Dg Chest Port 1 View  Result Date:  05/12/2018 CLINICAL DATA:  Acute respiratory failure EXAM: PORTABLE CHEST 1 VIEW COMPARISON:  May 11, 2018 FINDINGS: The ETT terminates 16 mm above the carina. Recommend withdrawing 1 cm. The right Port-A-Cath is  stable. The NG tube terminates below today's film. Layering effusion with underlying opacity on the left is similar in the interval. Focal infiltrate in the right lower lobe is stable. There may also be pulmonary edema. No other changes. IMPRESSION: 1. The ETT terminates 1.6 cm above the carina. Recommend withdrawing 1 cm. Other support apparatus as above. 2. Effusion and underlying opacity on the left is stable to mildly worsened. Focal infiltrate on the right is stable. Probable superimposed edema. Electronically Signed   By: Dorise Bullion III M.D   On: 05/12/2018 07:32   US Thoracentesis Asp Pleural Space W/img Guide  Result Date: 05/11/2018 INDICATION: Symptomatic right sided pleural effusion. Please perform ultrasound-guided thoracentesis for therapeutic purposes. EXAM: US THORACENTESIS ASP PLEURAL SPACE W/IMG GUIDE COMPARISON:  Chest CT-04/18/2018; chest radiograph-05/09/2018 MEDICATIONS: None. COMPLICATIONS: None immediate. TECHNIQUE: Informed written consent was obtained from the patient after a discussion of the risks, benefits and alternatives to treatment. A timeout was performed prior to the initiation of the procedure. Initial ultrasound scanning demonstrates a large anechoic right-sided pleural effusion. The lower chest was prepped and draped in the usual sterile fashion. 1% lidocaine was used for local anesthesia. An ultrasound image was saved for documentation purposes. An 8 Fr Safe-T-Centesis catheter was introduced. The thoracentesis was performed. The catheter was removed and a dressing was applied. The patient tolerated the procedure well without immediate post procedural complication. The patient was escorted to have an upright chest radiograph. FINDINGS: A total of approximately 1.7 liters of serous fluid was removed. IMPRESSION: Successful ultrasound-guided right sided thoracentesis yielding 1.7 liters of pleural fluid. Electronically Signed   By: Sandi Mariscal M.D.   On: 05/11/2018 10:45      STUDIES:  R thoracentesis 5/31 >> atypical lymphocytosis noted Excisional biopsy left axillary lymph node 6/3 >> angioimmunoblastic T-cell lymphoma, pattern 1 Bone marrow biopsy 6/17 >> T-cell lymphoma   CULTURES: Blood 6/23 >>  resp 6/23 >>   ANTIBIOTICS: Unasyn 6/23 >>   SIGNIFICANT EVENTS: Acute decompensation following emesis and apparent aspiration 6/23  LINES/TUBES: Port-A-Cath placement 6/19 >>   DISCUSSION: 62 year old woman with newly diagnosed malignant T-cell lymphoma with right pleural involvement.  Experienced acute respiratory failure and overall decompensation following emesis and apparent aspiration event.  Consider also reexpansion pulmonary edema or hemorrhage post right thoracentesis but feel that this is unlikely. -Clinically stable.  Unfortunately failed SBT.  Has more volume/effusion on chest x-ray today.  Will diuresis, hopefully ready for extubation following volume removal.  May need repeat thoracentesis in order to liberate from ventilator  ASSESSMENT / PLAN:   Acute respiratory failure; Probable right lower lobe aspiration pneumonia Consider right lung reexpansion pulmonary edema or hemorrhage post right thoracentesis.   Malignant right pleural effusion, lymphoma History of tobacco and presumed COPD -Portable chest x-ray personally reviewed.  This shows endotracheal tube is in about 1.6 cm above carina.  Bilateral effusions/airspace disease.  Right looks a little worse compared to day prior cannot exclude element of edema -Sputum culture still pending -Failed spontaneous breathing trial this a.m. developed significant accessory use even on pressure support of 12 Plan Continuing full ventilator support, aiming to decrease FiO2 less than 40%, then PEEP down to 5 Lasix x1 Continuing Unasyn, day #2.  Awaiting culture data Cycle procalcitonin Repeat a.m. chest x-ray,  may need repeat thoracentesis in order to get off ventilator ABG in a.m. Continue  bronchodilators  Hypotension Suspect drug-related; does meet SIRS criteria so sepsis on differential diagnosis however I favor drug-related Plan Continue phenylephrine goal systolic pressure greater than 100 Continue telemetry monitoring   Malignant angioimmunoblastic T-cell lymphoma, bone marrow and right pleural involvement Plan Continuing Faille doses of prednisone to complete first cycle of CHOP 5-day regimen of filgrastim No change in allopurinol  At risk for fluid and electrolyte imbalance.   Plan Trend daily chemistries and intake and output  Hyperglycemia Plan Sliding scale insulin  Protein calorie malnutrition Plan Start tube feeds  Leukocytosis. Suspect steroid related Plan Trend CBC  Anemia of chronic disease, also mild thrombocytopenia Plan Close observation while on enoxaparin, may need to discontinue. Trend CBC  FAMILY  - Updates: Husband, son, extended family updated at bedside 6/23  - Inter-disciplinary family meet or Palliative Care meeting due by: 05/18/18  DVT prophylaxis: Enoxaparin SUP: Pantoprazole Diet: Start tube feeds Activity: Bedrest with head of bed elevated Disposition : Intensive care  Erick Colace ACNP-BC [place] Pager # [phone] OR # [phone] if no answer

## 2018-05-12 NOTE — Progress Notes (Addendum)
Physical Therapy Treatment Patient Details Name: Deborah Cooley MRN: [id number] DOB: 06-Mar-1956 Today's Date: 05/12/2018    History of Present Illness Pt is a 62 y.o. F with significant PMH of HTN, endometriosis s/p hysterectomy, tobaccos use and recent hospitalization for multifocal lymphadenopathy and concern for lymphoma who presents for evaluation of recurrent shortness of breath and abdominal pain. Transfer to ICU 05/11/18 with VDRF.    PT Comments    Pt now in ICU with VDRF. RN stated pt could attempt supine to sit. No assist needed for supine to sit, pt unable to tolerate sitting 2* nausea and respiratory distress, so returned to supine with min assist. Performed BLE exercises.     Follow Up Recommendations  Home health PT     Equipment Recommendations  None recommended by PT    Recommendations for Other Services       Precautions / Restrictions Precautions Precautions: Fall Restrictions Weight Bearing Restrictions: No    Mobility  Bed Mobility   Bed Mobility: Sit to Supine     Supine to sit: Modified independent (Device/Increase time);HOB elevated Sit to supine: Min assist   General bed mobility comments: pt now in ICU on vent, RN stated Pt can attempt supine to sit. Mod I using bedrail and HOB up 45* to sit, pt not able to tolerate sitting on EOB 2* nausea/strained coughing so returned to supine with min A for LEs into bed  Transfers                    Ambulation/Gait                 Stairs             Wheelchair Mobility    Modified Rankin (Stroke Patients Only)       Balance                                            Cognition Arousal/Alertness: Awake/alert Behavior During Therapy: WFL for tasks assessed/performed Overall Cognitive Status: Within Functional Limits for tasks assessed                                        Exercises General Exercises - Lower Extremity Ankle  Circles/Pumps: AROM;Both;10 reps;Seated Short Arc Quad: AROM;Both;5 reps;Supine    General Comments        Pertinent Vitals/Pain Pain Assessment: No/denies pain    Home Living                      Prior Function            PT Goals (current goals can now be found in the care plan section) Acute Rehab PT Goals Patient Stated Goal: play with grandkids PT Goal Formulation: With patient Time For Goal Achievement: 05/03/18 Potential to Achieve Goals: Good Progress towards PT goals: Not progressing toward goals - comment(pt now has VDRF)    Frequency    Min 3X/week      PT Plan Current plan remains appropriate    Co-evaluation              AM-PAC PT "6 Clicks" Daily Activity  Outcome Measure  Difficulty turning over in bed (including adjusting bedclothes, sheets and blankets)?: None Difficulty moving from lying  on back to sitting on the side of the bed? : A Little Difficulty sitting down on and standing up from a chair with arms (e.g., wheelchair, bedside commode, etc,.)?: Unable Help needed moving to and from a bed to chair (including a wheelchair)?: Total Help needed walking in hospital room?: Total Help needed climbing 3-5 steps with a railing? : Total 6 Click Score: 11    End of Session   Activity Tolerance: Treatment limited secondary to medical complications (Comment)(nausea, respiratory status) Patient left: in bed;with call bell/phone within reach;with nursing/sitter in room Nurse Communication: Mobility status PT Visit Diagnosis: Unsteadiness on feet (R26.81);Difficulty in walking, not elsewhere classified (R26.2)     Time: 6825-7493 PT Time Calculation (min) (ACUTE ONLY): 23 min  Charges:  $Therapeutic Exercise: 8-22 mins $Therapeutic Activity: 8-22 mins                    G Codes:          Philomena Doheny 05/12/2018, 10:13 AM [phone]

## 2018-05-13 ENCOUNTER — Inpatient Hospital Stay (HOSPITAL_COMMUNITY): Payer: Self-pay

## 2018-05-13 DIAGNOSIS — R7989 Other specified abnormal findings of blood chemistry: Secondary | ICD-10-CM

## 2018-05-13 DIAGNOSIS — Z978 Presence of other specified devices: Secondary | ICD-10-CM

## 2018-05-13 DIAGNOSIS — J9600 Acute respiratory failure, unspecified whether with hypoxia or hypercapnia: Secondary | ICD-10-CM

## 2018-05-13 DIAGNOSIS — R609 Edema, unspecified: Secondary | ICD-10-CM

## 2018-05-13 LAB — GLUCOSE, CAPILLARY
GLUCOSE-CAPILLARY: 126 mg/dL — AB (ref 70–99)
Glucose-Capillary: 150 mg/dL — ABNORMAL HIGH (ref 70–99)
Glucose-Capillary: 128 mg/dL — ABNORMAL HIGH (ref 70–99)
Glucose-Capillary: 132 mg/dL — ABNORMAL HIGH (ref 70–99)
Glucose-Capillary: 136 mg/dL — ABNORMAL HIGH (ref 70–99)
Glucose-Capillary: 98 mg/dL (ref 70–99)
Glucose-Capillary: 122 mg/dL — ABNORMAL HIGH (ref 70–99)

## 2018-05-13 LAB — CBC
HEMATOCRIT: 25.8 % — AB (ref 36.0–46.0)
HEMATOCRIT: 25.2 % — AB (ref 36.0–46.0)
HEMOGLOBIN: 8.2 g/dL — AB (ref 12.0–15.0)
Hemoglobin: 8.3 g/dL — ABNORMAL LOW (ref 12.0–15.0)
MCH: 28.3 pg (ref 26.0–34.0)
MCH: 29.4 pg (ref 26.0–34.0)
MCHC: 31.8 g/dL (ref 30.0–36.0)
MCHC: 32.9 g/dL (ref 30.0–36.0)
MCV: 89 fL (ref 78.0–100.0)
MCV: 89.4 fL (ref 78.0–100.0)
PLATELETS: 42 10*3/uL — AB (ref 150–400)
Platelets: 59 10*3/uL — ABNORMAL LOW (ref 150–400)
RBC: 2.9 MIL/uL — ABNORMAL LOW (ref 3.87–5.11)
RBC: 2.82 MIL/uL — ABNORMAL LOW (ref 3.87–5.11)
RDW: 16.2 % — ABNORMAL HIGH (ref 11.5–15.5)
RDW: 16 % — ABNORMAL HIGH (ref 11.5–15.5)
WBC: 12.5 10*3/uL — ABNORMAL HIGH (ref 4.0–10.5)
WBC: 6.1 10*3/uL (ref 4.0–10.5)

## 2018-05-13 LAB — COMPREHENSIVE METABOLIC PANEL
ALBUMIN: 2.1 g/dL — AB (ref 3.5–5.0)
ALT: 15 U/L (ref 0–44)
ANION GAP: 3 — AB (ref 5–15)
AST: 19 U/L (ref 15–41)
Alkaline Phosphatase: 52 U/L (ref 38–126)
BUN: 23 mg/dL (ref 8–23)
CO2: 31 mmol/L (ref 22–32)
Calcium: 7.9 mg/dL — ABNORMAL LOW (ref 8.9–10.3)
Chloride: 109 mmol/L (ref 98–111)
Creatinine, Ser: 0.46 mg/dL (ref 0.44–1.00)
GFR calc Af Amer: >60 mL/min (ref >60)
GFR calc non Af Amer: >60 mL/min (ref >60)
GLUCOSE: 150 mg/dL — AB (ref 70–99)
POTASSIUM: 3.3 mmol/L — AB (ref 3.5–5.1)
SODIUM: 143 mmol/L (ref 135–145)
Total Bilirubin: 0.5 mg/dL (ref 0.3–1.2)
Total Protein: 5.4 g/dL — ABNORMAL LOW (ref 6.5–8.1)

## 2018-05-13 LAB — BLOOD GAS, ARTERIAL
Acid-Base Excess: 5.9 mmol/L — ABNORMAL HIGH (ref 0.0–2.0)
Bicarbonate: 29.8 mmol/L — ABNORMAL HIGH (ref 20.0–28.0)
Drawn by: 225631
FIO2: 40
MECHVT: 400 mL
O2 Saturation: 97.5 %
PATIENT TEMPERATURE: 98
PEEP/CPAP: 8 cmH2O
PO2 ART: 89.2 mmHg (ref 83.0–108.0)
RATE: 20 resp/min
pCO2 arterial: 40.9 mmHg (ref 32.0–48.0)
pH, Arterial: 7.473 — ABNORMAL HIGH (ref 7.350–7.450)

## 2018-05-13 LAB — PHOSPHORUS
Phosphorus: 2.5 mg/dL (ref 2.5–4.6)
Phosphorus: 2.3 mg/dL — ABNORMAL LOW (ref 2.5–4.6)

## 2018-05-13 LAB — HEPARIN LEVEL (UNFRACTIONATED): Heparin Unfractionated: 0.42 IU/mL (ref 0.30–0.70)

## 2018-05-13 LAB — URIC ACID: Uric Acid, Serum: 3.6 mg/dL (ref 2.5–7.1)

## 2018-05-13 LAB — PROCALCITONIN: PROCALCITONIN: 2.94 ng/mL

## 2018-05-13 LAB — MAGNESIUM
Magnesium: 1.8 mg/dL (ref 1.7–2.4)
Magnesium: 1.6 mg/dL — ABNORMAL LOW (ref 1.7–2.4)

## 2018-05-13 LAB — MISC LABCORP TEST (SEND OUT)
LABCORP TEST CODE: 480708
LabCorp test name: 480708

## 2018-05-13 MED ORDER — INSULIN ASPART 100 UNIT/ML ~~LOC~~ SOLN
0.0000 [IU] | Freq: Three times a day (TID) | SUBCUTANEOUS | Status: DC
  Administered 2018-05-13 – 2018-05-15 (×6): 3 [IU] via SUBCUTANEOUS

## 2018-05-13 MED ORDER — LIP MEDEX EX OINT
TOPICAL_OINTMENT | CUTANEOUS | Status: AC
  Administered 2018-05-13: 1
  Filled 2018-05-13: qty 7

## 2018-05-13 MED ORDER — ALLOPURINOL 300 MG PO TABS
300.0000 mg | ORAL_TABLET | Freq: Every day | ORAL | Status: DC
  Administered 2018-05-14 – 2018-05-19 (×6): 300 mg via ORAL
  Filled 2018-05-13: qty 1
  Filled 2018-05-13: qty 3
  Filled 2018-05-13 (×2): qty 1
  Filled 2018-05-13 (×2): qty 3

## 2018-05-13 MED ORDER — MAGNESIUM SULFATE IN D5W 1-5 GM/100ML-% IV SOLN
1.0000 g | Freq: Once | INTRAVENOUS | Status: AC
  Administered 2018-05-13: 1 g via INTRAVENOUS
  Filled 2018-05-13: qty 100

## 2018-05-13 MED ORDER — IPRATROPIUM-ALBUTEROL 0.5-2.5 (3) MG/3ML IN SOLN
3.0000 mL | RESPIRATORY_TRACT | Status: DC | PRN
  Administered 2018-05-17: 3 mL via RESPIRATORY_TRACT
  Filled 2018-05-13: qty 3

## 2018-05-13 MED ORDER — HEPARIN (PORCINE) IN NACL 100-0.45 UNIT/ML-% IJ SOLN
1000.0000 [IU]/h | INTRAMUSCULAR | Status: DC
  Administered 2018-05-13: 1000 [IU]/h via INTRAVENOUS
  Filled 2018-05-13 (×2): qty 250

## 2018-05-13 MED ORDER — FAMOTIDINE IN NACL 20-0.9 MG/50ML-% IV SOLN
20.0000 mg | Freq: Two times a day (BID) | INTRAVENOUS | Status: DC
  Administered 2018-05-13 – 2018-05-14 (×2): 20 mg via INTRAVENOUS
  Filled 2018-05-13 (×2): qty 50

## 2018-05-13 NOTE — Progress Notes (Signed)
Pt. Complaining of RUE swelling and weeping. Dr. Stood paged and ultrasound order to rule out blood clot.

## 2018-05-13 NOTE — Progress Notes (Addendum)
*  Preliminary Results* Right upper extremity venous duplex completed. Right upper extremity is positive for acute deep vein thrombosis involving the right subclavian vein. Pulsatile venous flow is seen, suggestive of possible elevated right-sided heart pressure.  Preliminary results discussed with Bailey Mech, RN.  05/13/2018 3:36 PM  Maudry Mayhew, BS, RVT, RDCS, RDMS

## 2018-05-13 NOTE — Progress Notes (Signed)
Unable to find appropriate vein for IV. 2 attempted sites blew. Pt refuses further attempts. Notified assigned RN.

## 2018-05-13 NOTE — Progress Notes (Signed)
ANTICOAGULATION CONSULT NOTE - Initial Consult  Pharmacy Consult for heparin  Indication: DVT  No Known Allergies  Patient Measurements: Height: 5' 1.81" (157 cm) Weight: 139 lb 15.9 oz (63.5 kg) IBW/kg (Calculated) : 49.67 Heparin Dosing Weight: 63 kg  Vital Signs: Temp: 97.8 F (36.6 C) (06/25 1200) Temp Source: Axillary (06/25 1200) BP: 143/60 (06/25 1600)  Labs: Recent Labs    05/11/18 0500 05/12/18 0500 05/13/18 0526  HGB 8.9* 9.8* 8.2*  HCT 26.8* 29.8* 25.8*  PLT 99* 84* 59*  LABPROT  --  14.4  --   INR  --  1.13  --   CREATININE 0.53 0.50 0.46    Estimated Creatinine Clearance: 63.5 mL/min (by C-G formula based on SCr of 0.46 mg/dL).  Medical History: Past Medical History:  Diagnosis Date  . Bilateral pleural effusion 04/17/2018  . Endometriosis   . Essential hypertension, benign   . Family history of adverse reaction to anesthesia    "sister stops breathing I think" (04/18/2018)  . Heart murmur   . Mixed hyperlipidemia   . Pneumonia 04/17/2018   Assessment: Patient diagnosed with right upper extremity DVT and consult entered for heparin drip. Patient has stage III/IV angioimmunoblastic T cell lymphoma CD30+ being followed by oncology. Patient is thrombocytopenia s/p C1 CHOP on 05/08/18 and will need to be closely monitored for any signs/symptoms of bleeding.  Goal of Therapy:  Heparin level 0.3-0.7 units/ml Monitor platelets by anticoagulation protocol: Yes   Plan:   Discontinue enoxaparin 40 mg subQ daily (last dose @ 1329 today)  Per discussion with MD, since patient received enoxaparin dose today and Plt = 59, will initiate heparin drip without initial bolus  Start heparin at 1000 units/hr  Discussed with RN regarding closely monitoring for any signs/symptoms of bleeding, especially with thrombocytopenia  Will check HL and CBC in 6 hours  CBC and HL to be checked at least daily   Lenis Noon, PharmD, BCPS Clinical  Pharmacist 05/13/2018,4:36 PM

## 2018-05-13 NOTE — Progress Notes (Signed)
Nutrition Follow-up  DOCUMENTATION CODES:   Not applicable  INTERVENTION:  - Diet advancement as medically feasible - RD will monitor for additional nutrition-related needs at follow-up.   NUTRITION DIAGNOSIS:   Increased nutrient needs related to catabolic illness, cancer and cancer related treatments as evidenced by estimated needs. -revised, ongoing  GOAL:   Patient will meet greater than or equal to 90% of their needs -unmet at this time  MONITOR:   Diet advancement, Weight trends, Labs  ASSESSMENT:   62 yo Female with PMH of HTN, endometriosis s/p hysterectomy, tobacco use, who recently admitted with multifocal lymphadenopathy and concerns for lymphoma, she presents with shortness of breath and difficulty sleeping. Symptoms become worse at night. She continues to exhibit lymphadenopathy concerning for lymphoma upon this admission, also has a calcified mass in R lobe of thyroid.  Pt has now been diagnosed with angioimmunoblastic T-cell lymphoma Stage III-IV.  Significant Events: 6/18 - pt transferred to [facility] for Port-A-Cath placement and initiation of chemotherapy with CHOP C1 and G-CSF support 6/19 - Port-A-Cath placed 6/20 - first chemotherapy 6/23 - s/p therapeutic thoracentesis yielding 1.7 L serous pleural fluid, apparent aspiration and intubated for respiratory failure 6/24 - c/s for tube feeding, tube feeding protocol initiated 6/25 - extubated and OGT removed    Current weight consistent with admission weight. Patient was extubated and OGT removed at 9:00 AM today (about 2 hours ago). She remains NPO at this time. Estimated nutrition needs adjusted; will monitor for need to further adjust in the future.    Medications reviewed; 20 mg IV Pepcid BID, sliding scale Novolog, 40 mEq KCl per OGT x1 yesterday.  Labs reviewed; CBGs: 128 and 132 mg/dL today, K: 3.3 mmol/L, Ca: 7.9 mg/dL.      Diet Order:   Diet Order           Diet NPO time specified  Diet  effective now          EDUCATION NEEDS:   No education needs have been identified at this time  Skin:  Skin Assessment: (closed incision to R buttocks)  Last BM:  6/22  Height:   Ht Readings from Last 1 Encounters:  05/09/18 5' 1.81" (1.57 m)    Weight:   Wt Readings from Last 1 Encounters:  05/13/18 139 lb 15.9 oz (63.5 kg)    Ideal Body Weight:  50 kg  BMI:  Body mass index is 25.76 kg/m.  Estimated Nutritional Needs:   Kcal:  2030-2225 (32-35 kcal/kg)  Protein:  95-108 grams (1.5-1.7 grams/kg)  Fluid:  >/= 1.6 L/day     Jarome Matin, MS, RD, LDN, [facility] Inpatient Clinical Dietitian Pager # [phone] After hours/weekend pager # [phone]

## 2018-05-13 NOTE — Progress Notes (Signed)
Pt. Extubated at 0900. Pt. Tolerated well, no complaints of pain, oriented x 4, On 4L of O2

## 2018-05-13 NOTE — Progress Notes (Signed)
HEMATOLOGY/ONCOLOGY INPATIENT PROGRESS NOTE  Date of Service: 05/13/2018  Inpatient Attending: .Chesley Mires, MD   SUBJECTIVE:   Deborah Cooley  Was seen this afternoon. She has been intubated and is not needing any oxygen when seen. No chest pain and notes no shortness of breath. Notes generalized edema that she thinks is a little worse- however appears to be down 5 lbs. On Unasyn. Undergoing swallow evaluation given concern for possible aspiration. No other acute symptoms. Daughter at bedside.  On review of systems, pt reports leg swelling and denies abdominal pains.    OBJECTIVE:  NAD  PHYSICAL EXAMINATION: . Vitals:   05/13/18 0800 05/13/18 0815 05/13/18 0830 05/13/18 0845  BP:      Pulse:      Resp: (!) 28 (!) 26 (!) 25 (!) 28  Temp:      TempSrc:      SpO2: 99% 96% 99% 98%  Weight:      Height:       Filed Weights   05/12/18 0500 05/13/18 0600 05/14/18 0600  Weight: 144 lb 2.9 oz (65.4 kg) 139 lb 15.9 oz (63.5 kg) 139 lb 12.4 oz (63.4 kg)   .Body mass index is 25.76 kg/m.  GENERAL:alert,  Awake, off ventilator now. Marland KitchenSKIN: no acute rashes EYES: conjunctiva are pink and non-injected, sclera anicteric OROPHARYNX: MMM NECK: supple, no JVD LYMPH:  no palpable lymphadenopathy in the cervical, axillary or inguinal regions LUNGS: coarse breathing sounds b/l bases HEART: regular rate & rhythm ABDOMEN:  normoactive bowel sounds , non tender, not distended. Extremity: 2+ pedal edema with anasarca PSYCH: alert & oriented x 3 with fluent speech NEURO: no focal motor/sensory deficits    MEDICAL HISTORY:  Past Medical History:  Diagnosis Date  . Bilateral pleural effusion 04/17/2018  . Endometriosis   . Essential hypertension, benign   . Family history of adverse reaction to anesthesia    "sister stops breathing I think" (04/18/2018)  . Heart murmur   . Mixed hyperlipidemia   . Pneumonia 04/17/2018    SURGICAL HISTORY: Past Surgical History:  Procedure  Laterality Date  . ABDOMINAL HYSTERECTOMY  1970s/1980s   for endometriosis  . Arthroscopic right shoulder surgery    . AXILLARY LYMPH NODE BIOPSY Left 04/21/2018   Procedure: LEFT AXILLARY LYMPH NODE BIOPSY;  Surgeon: Kieth Brightly, Arta Bruce, MD;  Location: [place];  Service: General;  Laterality: Left;  . CARPAL TUNNEL RELEASE Right   . GANGLION CYST EXCISION Right   . IR IMAGING GUIDED PORT INSERTION  05/07/2018  . MULTIPLE TOOTH EXTRACTIONS    . OOPHORECTOMY  1997  . PARTIAL HYSTERECTOMY  1984  . Right hand surgery    . SHOULDER OPEN ROTATOR CUFF REPAIR Right   . VESICOVAGINAL FISTULA CLOSURE W/ TAH      SOCIAL HISTORY: Social History   Socioeconomic History  . Marital status: Married    Spouse name: Not on file  . Number of children: Not on file  . Years of education: Not on file  . Highest education level: Not on file  Occupational History  . Occupation: Social research officer, government    Comment: works at [employer]  . Financial resource strain: Not on file  . Food insecurity:    Worry: Not on file    Inability: Not on file  . Transportation needs:    Medical: Not on file    Non-medical: Not on file  Tobacco Use  . Smoking status: Current Every Day Smoker  Packs/day: 1.50    Years: 46.00    Pack years: 69.00    Types: Cigarettes  . Smokeless tobacco: Never Used  Substance and Sexual Activity  . Alcohol use: Not Currently    Comment: 04/18/2018 "quit years ago"  . Drug use: Not Currently    Comment: "back in my 61s"  . Sexual activity: Not Currently  Lifestyle  . Physical activity:    Days per week: Not on file    Minutes per session: Not on file  . Stress: Not on file  Relationships  . Social connections:    Talks on phone: Not on file    Gets together: Not on file    Attends religious service: Not on file    Active member of club or organization: Not on file    Attends meetings of clubs or organizations: Not on file    Relationship status: Not on  file  . Intimate partner violence:    Fear of current or ex partner: Not on file    Emotionally abused: Not on file    Physically abused: Not on file    Forced sexual activity: Not on file  Other Topics Concern  . Not on file  Social History Narrative   ** Merged History Encounter **       Patient has one son whom is healthy.   Has a live in boyfriend.    FAMILY HISTORY: Family History  Problem Relation Age of Onset  . Diabetes Mother        age 82  . Heart attack Father        died age 61's  . Hypertension Father   . Cancer Sister        breast cancer diagonsed age 77 years  . Diabetes Unknown   . Heart disease Unknown        Female <55  . Arthritis Unknown   . Asthma Unknown   . Hyperlipidemia Other        several siblings  . Thyroid disease Sister        one sister with thyroid disease    ALLERGIES:  has No Known Allergies.  MEDICATIONS:  Scheduled Meds: . allopurinol  300 mg Oral Daily  . chlorhexidine gluconate (MEDLINE KIT)  15 mL Mouth Rinse BID  . Chlorhexidine Gluconate Cloth  6 each Topical Daily  . enoxaparin (LOVENOX) injection  40 mg Subcutaneous Q24H  . insulin aspart  0-20 Units Subcutaneous Q4H  . mouth rinse  15 mL Mouth Rinse 10 times per day  . multivitamin  15 mL Oral Daily  . sodium chloride flush  10-40 mL Intracatheter Q12H  . sodium chloride flush  3 mL Intravenous Q12H   Continuous Infusions: . sodium chloride    . ampicillin-sulbactam (UNASYN) IV Stopped (05/13/18 0841)  . famotidine (PEPCID) IV    . phenylephrine (NEO-SYNEPHRINE) Adult infusion 20 mcg/min (05/13/18 0845)   PRN Meds:.acetaminophen **OR** acetaminophen, heparin lock flush, ipratropium-albuterol, polyethylene glycol, prochlorperazine, sodium chloride flush, sodium chloride flush, sodium chloride flush  REVIEW OF SYSTEMS:    A 10+ POINT REVIEW OF SYSTEMS WAS OBTAINED including neurology, dermatology, psychiatry, cardiac, respiratory, lymph, extremities, GI, GU,  Musculoskeletal, constitutional, breasts, reproductive, HEENT.  All pertinent positives are noted in the HPI.  All others are negative.    LABORATORY DATA:  I have reviewed the data as listed  . CBC Latest Ref Rng & Units 05/13/2018 05/12/2018 05/11/2018  WBC 4.0 - 10.5 K/uL 12.5(H) 16.3(H) 9.5  Hemoglobin 12.0 - 15.0 g/dL 8.2(L) 9.8(L) 8.9(L)  Hematocrit 36.0 - 46.0 % 25.8(L) 29.8(L) 26.8(L)  Platelets 150 - 400 K/uL 59(L) 84(L) 99(L)    . CMP Latest Ref Rng & Units 05/13/2018 05/12/2018 05/11/2018  Glucose 70 - 99 mg/dL 150(H) 115(H) 123(H)  BUN 8 - 23 mg/dL 23 26(H) 22(H)  Creatinine 0.44 - 1.00 mg/dL 0.46 0.50 0.53  Sodium 135 - 145 mmol/L 143 141 139  Potassium 3.5 - 5.1 mmol/L 3.3(L) 3.6 3.9  Chloride 98 - 111 mmol/L 109 109 106  CO2 22 - 32 mmol/L '31 29 30  ' Calcium 8.9 - 10.3 mg/dL 7.9(L) 7.8(L) 8.5(L)  Total Protein 6.5 - 8.1 g/dL 5.4(L) 5.4(L) -  Total Bilirubin 0.3 - 1.2 mg/dL 0.5 0.6 -  Alkaline Phos 38 - 126 U/L 52 42 -  AST 15 - 41 U/L 19 23 -  ALT 0 - 44 U/L 15 15 -   Component     Latest Ref Rng & Units 05/12/2018  Magnesium     1.7 - 2.4 mg/dL 1.7  Phosphorus     2.5 - 4.6 mg/dL 3.0  Sed Rate     0 - 22 mm/hr 115 (H)  CRP     <1.0 mg/dL 5.2 (H)    RADIOGRAPHIC STUDIES: I have personally reviewed the radiological images as listed and agreed with the findings in the report. Dg Chest 1 View  Result Date: 05/11/2018 CLINICAL DATA:  62 y/o F; status post intubation. Recent thoracentesis. EXAM: CHEST  1 VIEW COMPARISON:  05/11/2018 chest radiograph. FINDINGS: Stable right port catheter tip projecting over mid SVC. Endotracheal tube tip projects 2 cm above carina. Enteric tube tip extends below field of view into the abdomen. Transcutaneous pacing pads noted. Increased diffuse hazy opacities of the lungs and stable basilar consolidations. No pneumothorax. Bilateral pleural effusions. Bones are unremarkable. No pneumothorax. IMPRESSION: 1. Endotracheal tube tip 2 cm  above carina. 2. Enteric tube tip below field of view in the abdomen. 3. Increased diffuse hazy opacities of the lungs and stable basilar consolidation. Bilateral effusions. Electronically Signed   By: Kristine Garbe M.D.   On: 05/11/2018 13:34   Dg Chest 1 View  Result Date: 05/11/2018 CLINICAL DATA:  Post thoracentesis now with worsening cough, shortness of breath and vomiting. EXAM: CHEST  1 VIEW COMPARISON:  Earlier same day; 05/09/2018; 05/08/2018 FINDINGS: Interval development of recurrent right basilar consolidative suspected airspace opacities now with partial obscuration of the right heart border. Otherwise, grossly unchanged cardiac silhouette and mediastinal contours with atherosclerotic plaque when the thoracic aorta. Unchanged small to moderate size left-sided effusion associated left basilar consolidative opacities. Pulmonary vasculature remains distinct with cephalization of flow. No pneumothorax. Stable position of support apparatus. Known peripherally calcified thyroid nodule overlies the thoracic inlet. No acute osseus abnormalities. IMPRESSION: 1. Interval development of recurrent right basilar consolidative airspace opacities post recent large volume right-sided thoracentesis - differential considerations are broad though given provided history of vomiting, findings are concerning for aspiration with additional differential considerations including re-expansion alveolar pulmonary edema and (less likely) pulmonary hemorrhage. 2. Grossly unchanged small to moderate size left-sided effusion. 3. Persistent findings of pulmonary venous congestion/mild pulmonary edema. Above findings discussed with rapid response team at the time of image acquisition. Electronically Signed   By: Sandi Mariscal M.D.   On: 05/11/2018 11:50   Dg Chest 1 View  Result Date: 05/11/2018 CLINICAL DATA:  Post right-sided thoracentesis EXAM: CHEST  1 VIEW COMPARISON:  05/09/2018 FINDINGS: Interval  reduction/near  resolution of residual trace right-sided effusion post thoracentesis. No pneumothorax. No change to slight increase in small to moderate size left-sided effusion. Improved aeration of lung base with persistent right basilar heterogeneous opacities. Unchanged left basilar consolidative opacities. Pulmonary vasculature remains indistinct with cephalization of flow and nodular thickening of the pulmonary interstitium. Grossly unchanged cardiac silhouette and mediastinal contours with atherosclerotic plaque within the thoracic aorta. Calcification overlying the right-sided the thoracic inlet is unchanged and compatible with known peripherally calcified thyroid nodule. Stable position of support apparatus. No acute osseus abnormalities. IMPRESSION: 1. Interval reduction/near resolution of residual trace effusion post thoracentesis. No pneumothorax. 2. No change to slight increase in small to moderate size left-sided pleural effusion. 3. Overall improved aeration of lungs with persistent findings of pulmonary venous congestion/mild pulmonary edema. Electronically Signed   By: Sandi Mariscal M.D.   On: 05/11/2018 10:44   Dg Chest 1 View  Result Date: 05/08/2018 CLINICAL DATA:  Wheezing EXAM: CHEST  1 VIEW COMPARISON:  Chest x-ray of 04/23/2017 FINDINGS: There has been increase in volume of right pleural effusion with increasing right basilar atelectasis. A smaller left pleural effusion remains with probable partial atelectasis at the left lung base as well. The heart is mildly enlarged. A Port-A-Cath is now present with the tip overlying the mid lower SVC. No pneumothorax is seen. No bony abnormality is noted. IMPRESSION: 1. Increase in volume of right pleural effusion with increasing right basilar atelectasis. 2. Small left pleural effusion remains with probable partial atelectasis of the left lung base as well. 3. Port-A-Cath tip overlies the mid lower SVC. Electronically Signed   By: Ivar Drape M.D.   On: 05/08/2018  11:51   Dg Chest 2 View  Result Date: 05/09/2018 CLINICAL DATA:  Short of breath for 2 days. Cough yesterday but not today. Followup pleural effusion. EXAM: CHEST - 2 VIEW COMPARISON:  07/08/2018 and older exams. FINDINGS: Moderate to large right pleural effusion obscures the right hemidiaphragm and right heart border. Small to moderate left pleural effusion obscures the left hemidiaphragm. There are prominent vascular markings bilaterally without evidence of pulmonary edema. No evidence of pneumonia. No pneumothorax. Cardiac silhouette is normal in size. No mediastinal or hilar masses. Right anterior chest wall Port-A-Cath is stable. IMPRESSION: 1. Moderate to large right and small to moderate left pleural effusions similar to the previous day's study. No evidence of pneumonia or pulmonary edema. Electronically Signed   By: Lajean Manes M.D.   On: 05/09/2018 15:02   Dg Chest 2 View  Result Date: 04/23/2018 CLINICAL DATA:  Recent thoracentesis, worsening shortness of breath would like swelling. Productive cough. History of pleural effusion, pneumonia, left axillary node biopsy April 21, 2018. EXAM: CHEST - 2 VIEW COMPARISON:  Chest radiograph April 19, 2018 and CT chest Apr 18, 2018 FINDINGS: Increasing small bilateral pleural effusions. Diffuse interstitial prominence. Increased lung volumes and flattened hemidiaphragms. Worsening patchy right middle lobe airspace opacity. Left lung base atelectasis. Cardiac silhouette mildly enlarged. Calcified aortic arch. No pneumothorax. Calcified right thyroid nodule. Soft tissue planes and included osseous structures are not suspicious. IMPRESSION: Increasing small bilateral pleural effusions. Worsening right middle lobe airspace opacity with left lung base atelectasis. Mild cardiomegaly.  COPD. Aortic Atherosclerosis (ICD10-I70.0). Electronically Signed   By: Elon Alas M.D.   On: 04/23/2018 21:15   Dg Chest 2 View  Result Date: 04/19/2018 CLINICAL DATA:   Productive cough and shortness of breath for 1 week. EXAM: CHEST - 2 VIEW COMPARISON:  PA  and lateral chest 04/17/2018. CT chest, abdomen and pelvis 04/18/2018. FINDINGS: Small bilateral pleural effusions are seen, greater on the right. Basilar atelectasis is noted. No consolidative process or pneumothorax. Heart size is normal. Aortic atherosclerosis is noted. Calcified lesion in the right lobe of the thyroid is noted. No acute bony abnormality. IMPRESSION: Small bilateral pleural effusions and mild basilar atelectasis. Atherosclerosis. Electronically Signed   By: Inge Rise M.D.   On: 04/19/2018 13:55   Dg Chest 2 View  Result Date: 04/17/2018 CLINICAL DATA:  Abdominal pain.  Shortness of breath. EXAM: CHEST - 2 VIEW COMPARISON:  None. FINDINGS: The heart size is normal. The hila and mediastinum are unremarkable. A 2.2 cm eggshell calcification is seen in the right thoracic inlet. No pneumothorax. Mild interstitial prominence bilaterally. Small bilateral pleural effusions. No nodules or masses. No other acute abnormalities. IMPRESSION: 1. Mild interstitial opacities in the lungs are nonspecific. Edema could have this appearance but the lack of cardiomegaly would be unusual. Atypical infection is possible. 2. Small bilateral effusions. 3. The eggshell calcification in the right thoracic inlet is nonspecific. This could be a vascular structure or a calcified nodule in the thyroid. Electronically Signed   By: Dorise Bullion III M.D   On: 04/17/2018 23:32   Ct Angio Chest Pe W/cm &/or Wo Cm  Addendum Date: 04/18/2018   ADDENDUM REPORT: 04/18/2018 08:59 ADDENDUM: Add to IMPRESSION: There is wall thickening in the urinary bladder with mild soft tissue stranding adjacent to the bladder. Suspect a degree of cystitis. Electronically Signed   By: Lowella Grip III M.D.   On: 04/18/2018 08:59   Result Date: 04/18/2018 CLINICAL DATA:  Shortness of breath.  Abdominal pain EXAM: CT ANGIOGRAPHY CHEST CT  ABDOMEN AND PELVIS WITH CONTRAST TECHNIQUE: Multidetector CT imaging of the chest was performed using the standard protocol during bolus administration of intravenous contrast. Multiplanar CT image reconstructions and MIPs were obtained to evaluate the vascular anatomy. Multidetector CT imaging of the abdomen and pelvis was performed using the standard protocol during bolus administration of intravenous contrast. CONTRAST:  151m ISOVUE-370 IOPAMIDOL (ISOVUE-370) INJECTION 76% COMPARISON:  Chest radiograph Apr 17, 2018 FINDINGS: CTA CHEST FINDINGS Cardiovascular: There is no demonstrable pulmonary embolus. There is no thoracic aortic aneurysm or dissection. There are foci of calcification in the proximal visualized great vessels. There is atherosclerotic calcification in the thoracic aorta. There are scattered foci of coronary artery calcification. There is a small pericardial effusion. Mediastinum/Nodes: There is a partially calcified mass arising from the right lobe of the thyroid measuring 2.7 x 1.9 cm. Thyroid otherwise appears unremarkable. There is extensive adenopathy throughout the chest. There are multiple supraclavicular lymph nodes, largest measuring 1.0 x 0.9 cm. There are multiple axillary lymph nodes. The largest axillary lymph node on the left measures 2.5 x 2.1 cm cm. The largest axillary lymph node on the right measures 2.3 x 2.0 cm. There is adenopathy throughout the mediastinum. There are multiple aortopulmonary window region lymph nodes, largest measuring 1.4 x 1.4 cm. There is extensive adenopathy in the mediastinum surrounding the trachea and carina. There is a right pretracheal lymph node measuring 1.9 x 1.8 cm. There is a lymph node to the left of the origin of the carina measuring 1.6 x 1.5 cm. There is extensive adenopathy in the right hilar region. The largest individual lymph node in the right hilum measures 2.2 x 1.4 cm. There are multiple left hilar lymph nodes. The largest left hilar  lymph node measures 1.8 x  1.3 cm. There is extensive subcarinal adenopathy. Largest subcarinal lymph node measures 2.8 x 2.4 cm. There are retrocrural lymph nodes bilaterally, largest on the left measuring 1.3 x 1.0 cm. There are several small lymph nodes at the level of the right pericardiophrenic angle. No esophageal lesions are evident. Lungs/Pleura: There are free-flowing pleural effusions bilaterally, larger on the right than on the left. There is consolidation in both lung bases, likely primarily due to compressive atelectasis. There is atelectatic change with volume loss in the right middle lobe medially with focal consolidation adjacent to the right heart border in the medial segment right middle lobe. No pulmonary nodular type lesion is evident on this study. Musculoskeletal: There are no blastic or lytic bone lesions. No chest wall lesions are evident. Several benign-appearing calcifications are noted in the right breast. Review of the MIP images confirms the above findings. CT ABDOMEN and PELVIS FINDINGS Hepatobiliary: No focal liver lesions are appreciable. There is no appreciable gallbladder wall thickening. No biliary duct dilatation. Pancreas: No pancreatic mass or inflammatory focus. Extensive peripancreatic adenopathy is noted. Spleen: Spleen measures 15.0 x 12.4 x 6.8 cm with a measured splenic volume of 632 cubic cm. No focal splenic lesions are evident. Adrenals/Urinary Tract: Adrenals bilaterally appear unremarkable. Kidneys bilaterally show no evident mass or hydronephrosis. No renal or ureteral calculus. No hydronephrosis. Urinary bladder is midline with thickening of the urinary bladder wall. There is also mild soft tissue stranding surrounding the urinary bladder. Stomach/Bowel: There is no appreciable bowel wall or mesenteric thickening. There is no evident bowel obstruction. No free air or portal venous air. Vascular/Lymphatic: There is atherosclerotic calcification throughout the aorta  and iliac arteries. There is moderate narrowing of the aorta with what appears to be hemodynamically significant obstruction in both common femoral arteries. No aneurysm. Major mesenteric arterial vessels appear patent. There is extensive adenopathy throughout the abdomen. There are multiple enlarged lymph nodes in the peripancreatic region, largest measuring 1.7 x 1.5 cm. Scattered prominent mesenteric lymph nodes are noted throughout the abdomen. There is extensive retroperitoneal adenopathy. Largest retroperitoneal lymph node measures 2.5 x 1.5 cm. There is adenopathy to the right of the celiac axis measuring 2.3 x 1.5 cm. Adenopathy is seen in both external iliac node chains. The largest lymph node in this area on the right measures 2.6 x 1.7 cm. The largest lymph node in this region on the left measures 2.0 by 1.4 cm. There is also extensive adenopathy more posteriorly in the pelvis at the level of each acetabulum. The largest of these lymph nodes on the right measures 3.1 x 1.7 cm. Largest of these lymph nodes on the left measures 3.2 x 1.6 cm. There is adenopathy in both inguinal regions. Largest of these lymph nodes in the inguinal regions on the right measures 1.4 x 1.3 cm. Largest lymph node in the left inguinal region measures 1.8 x 1.6 cm. Reproductive: Uterus is apparently absent. No pelvic mass apart from adenopathy seen. Other: Appendix region appears normal. No abscess is evident in the abdomen or pelvis. There is mild ascites. Musculoskeletal: There are no appreciable blastic or lytic bone lesions. No intramuscular or abdominal wall lesions are evident. Review of the MIP images confirms the above findings. IMPRESSION: CT angiogram chest: 1. No demonstrable pulmonary embolus. No thoracic aortic aneurysm or dissection. Multiple foci of aortic atherosclerosis noted as well as foci of calcification in great vessels and coronary arteries. 2. Extensive adenopathy at multiple sites as summarized above.  This extensive adenopathy raises  concern for potential lymphoma. Small cell lung carcinoma could present in this manner as well. 3. Moderate pleural effusions bilaterally, larger on the right than on the left, with compressive atelectasis in the lung bases. There is focal consolidation felt to represent pneumonia adjacent to the right heart border in the medial segment right middle lobe. 4. Partially calcified dominant mass right lobe of thyroid. This mass may warrant nonemergent thyroid ultrasound to further evaluate. CT abdomen and pelvis: 1. Widespread abdominal and pelvic adenopathy. Splenomegaly. This combination of findings raises concern for lymphoma as most likely etiology. 2.  Mild ascites. 3. No abscess in the abdomen or pelvis. No periappendiceal region inflammation. 4. Extent extensive aortoiliac atherosclerosis with what appears to be hemodynamically significant obstruction in the common iliac arteries bilaterally. 5.  Uterus apparently absent. Aortic Atherosclerosis (ICD10-I70.0). Electronically Signed: By: Lowella Grip III M.D. On: 04/18/2018 08:41   Ct Abdomen Pelvis W Contrast  Addendum Date: 04/18/2018   ADDENDUM REPORT: 04/18/2018 08:59 ADDENDUM: Add to IMPRESSION: There is wall thickening in the urinary bladder with mild soft tissue stranding adjacent to the bladder. Suspect a degree of cystitis. Electronically Signed   By: Lowella Grip III M.D.   On: 04/18/2018 08:59   Result Date: 04/18/2018 CLINICAL DATA:  Shortness of breath.  Abdominal pain EXAM: CT ANGIOGRAPHY CHEST CT ABDOMEN AND PELVIS WITH CONTRAST TECHNIQUE: Multidetector CT imaging of the chest was performed using the standard protocol during bolus administration of intravenous contrast. Multiplanar CT image reconstructions and MIPs were obtained to evaluate the vascular anatomy. Multidetector CT imaging of the abdomen and pelvis was performed using the standard protocol during bolus administration of intravenous  contrast. CONTRAST:  168m ISOVUE-370 IOPAMIDOL (ISOVUE-370) INJECTION 76% COMPARISON:  Chest radiograph Apr 17, 2018 FINDINGS: CTA CHEST FINDINGS Cardiovascular: There is no demonstrable pulmonary embolus. There is no thoracic aortic aneurysm or dissection. There are foci of calcification in the proximal visualized great vessels. There is atherosclerotic calcification in the thoracic aorta. There are scattered foci of coronary artery calcification. There is a small pericardial effusion. Mediastinum/Nodes: There is a partially calcified mass arising from the right lobe of the thyroid measuring 2.7 x 1.9 cm. Thyroid otherwise appears unremarkable. There is extensive adenopathy throughout the chest. There are multiple supraclavicular lymph nodes, largest measuring 1.0 x 0.9 cm. There are multiple axillary lymph nodes. The largest axillary lymph node on the left measures 2.5 x 2.1 cm cm. The largest axillary lymph node on the right measures 2.3 x 2.0 cm. There is adenopathy throughout the mediastinum. There are multiple aortopulmonary window region lymph nodes, largest measuring 1.4 x 1.4 cm. There is extensive adenopathy in the mediastinum surrounding the trachea and carina. There is a right pretracheal lymph node measuring 1.9 x 1.8 cm. There is a lymph node to the left of the origin of the carina measuring 1.6 x 1.5 cm. There is extensive adenopathy in the right hilar region. The largest individual lymph node in the right hilum measures 2.2 x 1.4 cm. There are multiple left hilar lymph nodes. The largest left hilar lymph node measures 1.8 x 1.3 cm. There is extensive subcarinal adenopathy. Largest subcarinal lymph node measures 2.8 x 2.4 cm. There are retrocrural lymph nodes bilaterally, largest on the left measuring 1.3 x 1.0 cm. There are several small lymph nodes at the level of the right pericardiophrenic angle. No esophageal lesions are evident. Lungs/Pleura: There are free-flowing pleural effusions  bilaterally, larger on the right than on the left. There is  consolidation in both lung bases, likely primarily due to compressive atelectasis. There is atelectatic change with volume loss in the right middle lobe medially with focal consolidation adjacent to the right heart border in the medial segment right middle lobe. No pulmonary nodular type lesion is evident on this study. Musculoskeletal: There are no blastic or lytic bone lesions. No chest wall lesions are evident. Several benign-appearing calcifications are noted in the right breast. Review of the MIP images confirms the above findings. CT ABDOMEN and PELVIS FINDINGS Hepatobiliary: No focal liver lesions are appreciable. There is no appreciable gallbladder wall thickening. No biliary duct dilatation. Pancreas: No pancreatic mass or inflammatory focus. Extensive peripancreatic adenopathy is noted. Spleen: Spleen measures 15.0 x 12.4 x 6.8 cm with a measured splenic volume of 632 cubic cm. No focal splenic lesions are evident. Adrenals/Urinary Tract: Adrenals bilaterally appear unremarkable. Kidneys bilaterally show no evident mass or hydronephrosis. No renal or ureteral calculus. No hydronephrosis. Urinary bladder is midline with thickening of the urinary bladder wall. There is also mild soft tissue stranding surrounding the urinary bladder. Stomach/Bowel: There is no appreciable bowel wall or mesenteric thickening. There is no evident bowel obstruction. No free air or portal venous air. Vascular/Lymphatic: There is atherosclerotic calcification throughout the aorta and iliac arteries. There is moderate narrowing of the aorta with what appears to be hemodynamically significant obstruction in both common femoral arteries. No aneurysm. Major mesenteric arterial vessels appear patent. There is extensive adenopathy throughout the abdomen. There are multiple enlarged lymph nodes in the peripancreatic region, largest measuring 1.7 x 1.5 cm. Scattered prominent  mesenteric lymph nodes are noted throughout the abdomen. There is extensive retroperitoneal adenopathy. Largest retroperitoneal lymph node measures 2.5 x 1.5 cm. There is adenopathy to the right of the celiac axis measuring 2.3 x 1.5 cm. Adenopathy is seen in both external iliac node chains. The largest lymph node in this area on the right measures 2.6 x 1.7 cm. The largest lymph node in this region on the left measures 2.0 by 1.4 cm. There is also extensive adenopathy more posteriorly in the pelvis at the level of each acetabulum. The largest of these lymph nodes on the right measures 3.1 x 1.7 cm. Largest of these lymph nodes on the left measures 3.2 x 1.6 cm. There is adenopathy in both inguinal regions. Largest of these lymph nodes in the inguinal regions on the right measures 1.4 x 1.3 cm. Largest lymph node in the left inguinal region measures 1.8 x 1.6 cm. Reproductive: Uterus is apparently absent. No pelvic mass apart from adenopathy seen. Other: Appendix region appears normal. No abscess is evident in the abdomen or pelvis. There is mild ascites. Musculoskeletal: There are no appreciable blastic or lytic bone lesions. No intramuscular or abdominal wall lesions are evident. Review of the MIP images confirms the above findings. IMPRESSION: CT angiogram chest: 1. No demonstrable pulmonary embolus. No thoracic aortic aneurysm or dissection. Multiple foci of aortic atherosclerosis noted as well as foci of calcification in great vessels and coronary arteries. 2. Extensive adenopathy at multiple sites as summarized above. This extensive adenopathy raises concern for potential lymphoma. Small cell lung carcinoma could present in this manner as well. 3. Moderate pleural effusions bilaterally, larger on the right than on the left, with compressive atelectasis in the lung bases. There is focal consolidation felt to represent pneumonia adjacent to the right heart border in the medial segment right middle lobe. 4.  Partially calcified dominant mass right lobe of thyroid. This mass  may warrant nonemergent thyroid ultrasound to further evaluate. CT abdomen and pelvis: 1. Widespread abdominal and pelvic adenopathy. Splenomegaly. This combination of findings raises concern for lymphoma as most likely etiology. 2.  Mild ascites. 3. No abscess in the abdomen or pelvis. No periappendiceal region inflammation. 4. Extent extensive aortoiliac atherosclerosis with what appears to be hemodynamically significant obstruction in the common iliac arteries bilaterally. 5.  Uterus apparently absent. Aortic Atherosclerosis (ICD10-I70.0). Electronically Signed: By: Lowella Grip III M.D. On: 04/18/2018 08:41   Dg Chest Port 1 View  Result Date: 05/13/2018 CLINICAL DATA:  Follow-up pneumonia EXAM: PORTABLE CHEST 1 VIEW COMPARISON:  05/12/2018 FINDINGS: Endotracheal tube and nasogastric catheter and right-sided chest wall port are again seen and stable. Cardiac shadow is stable. Aortic calcifications are again noted. Right thyroid calcification is noted as well. Persistent bibasilar infiltrates are noted with associated effusions right greater than left. No bony abnormality is noted. IMPRESSION: Persistent bibasilar changes not significantly improved when compared with the prior exam. Electronically Signed   By: Inez Catalina M.D.   On: 05/13/2018 07:51   Dg Chest Port 1 View  Result Date: 05/12/2018 CLINICAL DATA:  Acute respiratory failure EXAM: PORTABLE CHEST 1 VIEW COMPARISON:  May 11, 2018 FINDINGS: The ETT terminates 16 mm above the carina. Recommend withdrawing 1 cm. The right Port-A-Cath is stable. The NG tube terminates below today's film. Layering effusion with underlying opacity on the left is similar in the interval. Focal infiltrate in the right lower lobe is stable. There may also be pulmonary edema. No other changes. IMPRESSION: 1. The ETT terminates 1.6 cm above the carina. Recommend withdrawing 1 cm. Other support  apparatus as above. 2. Effusion and underlying opacity on the left is stable to mildly worsened. Focal infiltrate on the right is stable. Probable superimposed edema. Electronically Signed   By: Dorise Bullion III M.D   On: 05/12/2018 07:32   Ct Bone Marrow Biopsy & Aspiration  Result Date: 05/05/2018 INDICATION: Lymphoma EXAM: CT BONE MARROW BIOPSY AND ASPIRATION MEDICATIONS: None. ANESTHESIA/SEDATION: Fentanyl 75 mcg IV; Versed 2 mg IV Moderate Sedation Time:  32 minutes The patient was continuously monitored during the procedure by the interventional radiology nurse under my direct supervision. FLUOROSCOPY TIME:  Fluoroscopy Time:  minutes  seconds ( mGy). COMPLICATIONS: None immediate. PROCEDURE: Informed written consent was obtained from the patient after a thorough discussion of the procedural risks, benefits and alternatives. All questions were addressed. Maximal Sterile Barrier Technique was utilized including caps, mask, sterile gowns, sterile gloves, sterile drape, hand hygiene and skin antiseptic. A timeout was performed prior to the initiation of the procedure. Under CT guidance, a(n) 11 gauge guide needle was advanced into the right iliac bone. Aspirates and a core were obtained. Post biopsy images demonstrate . Patient tolerated the procedure well without complication. Vital sign monitoring by nursing staff during the procedure will continue as patient is in the special procedures unit for post procedure observation. FINDINGS: The images document guide needle placement within the right iliac bone. Post biopsy images demonstrate no hemorrhage. IMPRESSION: Successful CT-guided right iliac bone marrow aspirate and core. Electronically Signed   By: Marybelle Killings M.D.   On: 05/05/2018 13:33   Ir Imaging Guided Port Insertion  Result Date: 05/07/2018 INDICATION: 62 year old female with a history of lymphoma. She has been referred for port catheter. EXAM: IMPLANTED PORT A CATH PLACEMENT WITH  ULTRASOUND AND FLUOROSCOPIC GUIDANCE MEDICATIONS: 2 g Ancef; The antibiotic was administered within an appropriate time interval prior  to skin puncture. ANESTHESIA/SEDATION: Moderate (conscious) sedation was employed during this procedure. A total of Versed mg and Fentanyl 100 mcg was administered intravenously. Moderate Sedation Time: 20 minutes. The patient's level of consciousness and vital signs were monitored continuously by radiology nursing throughout the procedure under my direct supervision. FLUOROSCOPY TIME:  0 minutes, 6 seconds (1.5 mGy) COMPLICATIONS: None PROCEDURE: The procedure, risks, benefits, and alternatives were explained to the patient. Questions regarding the procedure were encouraged and answered. The patient understands and consents to the procedure. Ultrasound survey was performed with images stored and sent to PACs. The right neck and chest was prepped with chlorhexidine, and draped in the usual sterile fashion using maximum barrier technique (cap and mask, sterile gown, sterile gloves, large sterile sheet, hand hygiene and cutaneous antiseptic). Antibiotic prophylaxis was provided with 2.0g Ancef administered IV one hour prior to skin incision. Local anesthesia was attained by infiltration with 1% lidocaine without epinephrine. Ultrasound demonstrated patency of the right internal jugular vein, and this was documented with an image. Under real-time ultrasound guidance, this vein was accessed with a 21 gauge micropuncture needle and image documentation was performed. A small dermatotomy was made at the access site with an 11 scalpel. A 0.018" wire was advanced into the SVC and used to estimate the length of the internal catheter. The access needle exchanged for a 27F micropuncture vascular sheath. The 0.018" wire was then removed and a 0.035" wire advanced into the IVC. An appropriate location for the subcutaneous reservoir was selected below the clavicle and an incision was made through  the skin and underlying soft tissues. The subcutaneous tissues were then dissected using a combination of blunt and sharp surgical technique and a pocket was formed. A single lumen power injectable portacatheter was then tunneled through the subcutaneous tissues from the pocket to the dermatotomy and the port reservoir placed within the subcutaneous pocket. The venous access site was then serially dilated and a peel away vascular sheath placed over the wire. The wire was removed and the port catheter advanced into position under fluoroscopic guidance. The catheter tip is positioned in the cavoatrial junction. This was documented with a spot image. The portacatheter was then tested and found to flush and aspirate well. The port was flushed with saline and Huber needle was left in place at the completion. The pocket was then closed in two layers using first subdermal inverted interrupted absorbable sutures followed by a running subcuticular suture. The epidermis was then sealed with Dermabond. The dermatotomy at the venous access site was also seal with Dermabond and Steri-Strips. Patient tolerated the procedure well and remained hemodynamically stable throughout. No complications encountered and no significant blood loss encountered . IMPRESSION: Status post right IJ port catheter placement. Catheter ready for use. Signed, Dulcy Fanny. Dellia Nims, RPVI [facility] Radiology Electronically Signed   By: Corrie Mckusick D.O.   On: 05/07/2018 17:19   US Thoracentesis Asp Pleural Space W/img Guide  Result Date: 05/11/2018 INDICATION: Symptomatic right sided pleural effusion. Please perform ultrasound-guided thoracentesis for therapeutic purposes. EXAM: US THORACENTESIS ASP PLEURAL SPACE W/IMG GUIDE COMPARISON:  Chest CT-04/18/2018; chest radiograph-05/09/2018 MEDICATIONS: None. COMPLICATIONS: None immediate. TECHNIQUE: Informed written consent was obtained from the patient  after a discussion of the risks, benefits and alternatives to treatment. A timeout was performed prior to the initiation of the procedure. Initial ultrasound scanning demonstrates a large anechoic right-sided pleural effusion. The lower chest was prepped and draped in the usual sterile fashion.  1% lidocaine was used for local anesthesia. An ultrasound image was saved for documentation purposes. An 8 Fr Safe-T-Centesis catheter was introduced. The thoracentesis was performed. The catheter was removed and a dressing was applied. The patient tolerated the procedure well without immediate post procedural complication. The patient was escorted to have an upright chest radiograph. FINDINGS: A total of approximately 1.7 liters of serous fluid was removed. IMPRESSION: Successful ultrasound-guided right sided thoracentesis yielding 1.7 liters of pleural fluid. Electronically Signed   By: Sandi Mariscal M.D.   On: 05/11/2018 10:45    ASSESSMENT & PLAN:  62 y.o. 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 S/P c1 OF CHOP on 05/08/2018  2. General LNadenopathy from lymphoma with b/l pleural effusion and b/l lower extremity weakness  3. Anemia/thrombocytopenia -  from Bone marrow involvement by lymphoma BM Bx confirms involved by T cell lymphoma. Hgb down to 8.2 and platelets down to 59k.  4. 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 LDH wnl no evidnece of overt hemolysis at this timne   5. CMV IgM+ --  Component     Latest Ref Rng & Units 05/09/2018  CMV Quant DNA PCR (Plasma)     Negative IU/mL 8,430  Log10 CMV Qn DNA Pl     log10 IU/mL 3.926    6. High risk for TLS - on allopurinol , received Rasburicase. Uric acid improving down to 3.8 Uric acid 16--->8--> 4.6  7. Port a cath in situ  8. 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 Acute hypoxic  respiratory failure - required intubation --now extubated and with minimal oxygen needs.  ?etiology Re-expansion pulmonary edema post large volume thoracentesis ?aspiration Though the rapidity of presentation is unusual would need to consider HCAP and CMV pneumonitis (CMV IgM+ and PCV positive with worsening). -elevated procalcitonin -improving.  PLAN -appreciate hospitalist/CCM input -on unasyn for concern of aspiration pneumonia -swallow evaluation in progress with appropriate dietary modifications -diuretics per fluid status -rpt CMV PCR quantitative to evaluate for increasing titers -ordered -would recommend ID consultation to weight in on CMV findings and need for pre-emptive/treatment considerations of CMV -daily CBC/diff  -will hold granix at this time. -monitor for anemia and thrombocytopenia -- anticipate might drop further and nadir around day 10-12 from CHOP chemotherapy on 6/20 -complete 5 days of prednisone per CHOP regimen. -DVT prophylaxis per hospitalist/CCM. Might hold prophylactic anticoagulation if plts< 40k, use SCD. Early ambulation. OOB to chair -will continue to follow   . The total time spent in the appointment was 25 minutes and more than 50% was on counseling and direct patient cares.  Sullivan Lone MD [place] AAHIVMS [facility] Hematology/Oncology Physician [facility]  (Office):       [phone] (Work cell):  [phone] (Fax):           [phone]  05/13/2018 8:50 AM

## 2018-05-13 NOTE — Evaluation (Signed)
Clinical/Bedside Swallow Evaluation Patient Details  Name: Deborah Cooley MRN: [id number] Date of Birth: [birth year]  Today's Date: 05/13/2018 Time:        Past Medical History:  Past Medical History:  Diagnosis Date  . Bilateral pleural effusion 04/17/2018  . Endometriosis   . Essential hypertension, benign   . Family history of adverse reaction to anesthesia    "sister stops breathing I think" (04/18/2018)  . Heart murmur   . Mixed hyperlipidemia   . Pneumonia 04/17/2018   Past Surgical History:  Past Surgical History:  Procedure Laterality Date  . ABDOMINAL HYSTERECTOMY  1970s/1980s   for endometriosis  . Arthroscopic right shoulder surgery    . AXILLARY LYMPH NODE BIOPSY Left 04/21/2018   Procedure: LEFT AXILLARY LYMPH NODE BIOPSY;  Surgeon: Kieth Brightly, Arta Bruce, MD;  Location: [place];  Service: General;  Laterality: Left;  . CARPAL TUNNEL RELEASE Right   . GANGLION CYST EXCISION Right   . IR IMAGING GUIDED PORT INSERTION  05/07/2018  . MULTIPLE TOOTH EXTRACTIONS    . OOPHORECTOMY  1997  . PARTIAL HYSTERECTOMY  1984  . Right hand surgery    . SHOULDER OPEN ROTATOR CUFF REPAIR Right   . VESICOVAGINAL FISTULA CLOSURE W/ TAH     HPI:  62 yo female smoker admitted with abdominal pain, cough, and lymphadenopathy.  Found to have b/l pleural effusions.  Rt thoracentesis 5/31 showed atypical lymphocytes.  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  Started on CHOP 6/20.  Had repeat thoracentesis 6/23.  Developed episode of nausea with vomiting leading to aspiration pneumonia, VDRF, and septic shock 6/23.   Assessment / Plan / Recommendation Clinical Impression  Pt with baseline cough due to pna which was not significantly worsened during po intake.  In addition voice was hoarse intially but this was decreased as intake progressed but not to baseline possibly indicative of mild edema.  Pt did NOT pass 3 ounce water test due  to significant cough within 15 seconds post-swallow of thin.  No indication of airway compromise with small boluses of thin.   Recommend start clear liquid diet with strict precautions.  SlP cannot rule out silent aspiration but pt was intubated for short amount of time and has a strong cough.   Educated pt/family to recommendations/plan and all agreeable to use caution with po.  Will follow for tolerance, readiness for advancement. Thanks for this consult.    SLP Visit Diagnosis: Dysphagia, unspecified (R13.10)    Aspiration Risk  Mild aspiration risk    Diet Recommendation Thin liquid;Other (Comment)(clears)   Liquid Administration via: Cup;No straw Medication Administration: (as tolerated) Supervision: Patient able to self feed Compensations: Slow rate;Small sips/bites Postural Changes: Seated upright at 90 degrees;Remain upright for at least 30 minutes after po intake    Other  Recommendations Oral Care Recommendations: Oral care BID   Follow up Recommendations        Frequency and Duration min 2x/week  1 week       Prognosis Prognosis for Safe Diet Advancement: Good      Swallow Study   General Date of Onset: 05/13/18 HPI: 62 yo female smoker admitted with abdominal pain, cough, and lymphadenopathy.  Found to have b/l pleural effusions.  Rt thoracentesis 5/31 showed atypical lymphocytes.  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  Started on CHOP 6/20.  Had repeat thoracentesis 6/23.  Developed episode of nausea  with vomiting leading to aspiration pneumonia, VDRF, and septic shock 6/23. Type of Study: Bedside Swallow Evaluation Diet Prior to this Study: NPO Temperature Spikes Noted: No Respiratory Status: Nasal cannula History of Recent Intubation: Yes Length of Intubations (days): 3 days Date extubated: 05/13/18(0900) Behavior/Cognition: Alert;Cooperative;Pleasant mood Oral Cavity Assessment: Within Functional  Limits Oral Care Completed by SLP: No Oral Cavity - Dentition: Edentulous Vision: Functional for self-feeding Self-Feeding Abilities: Able to feed self Patient Positioning: Upright in bed Baseline Vocal Quality: Hoarse Volitional Cough: Strong Volitional Swallow: Able to elicit    Oral/Motor/Sensory Function Overall Oral Motor/Sensory Function: Within functional limits   Ice Chips Ice chips: Within functional limits Presentation: Spoon   Thin Liquid Thin Liquid: Impaired Pharyngeal  Phase Impairments: Cough - Immediate Other Comments: pt did not pass 3 ounce water test - due to minimally delayed cough post-swallow, no indications of aspiration with thin via small boluses and protective exhalation noted after swallow    Nectar Thick Nectar Thick Liquid: Not tested   Honey Thick Honey Thick Liquid: Not tested   Puree Puree: Within functional limits Presentation: Self Fed;Spoon   Solid   GO   Solid: Impaired Other Comments: cough post-swallow x1/9 boluses        Macario Golds 05/13/2018,12:25 PM  Luanna Salk, [facility] SLP [phone]

## 2018-05-13 NOTE — Progress Notes (Addendum)
PULMONARY / CRITICAL CARE MEDICINE   Name: Debora Stockdale MRN: [id number] DOB: 09-18-1956    ADMISSION DATE:  04/23/2018 CONSULTATION DATE:  05/11/18  REFERRING MD:  Dr Zigmund Daniel, [facility]  CHIEF COMPLAINT:  Acute respiratory failure   HISTORY OF PRESENT ILLNESS:   62 yo female smoker admitted with abdominal pain, cough, and lymphadenopathy.  Found to have b/l pleural effusions.  Rt thoracentesis 5/31 showed atypical lymphocytes.  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  Started on CHOP 6/20.  Had repeat thoracentesis 6/23.  Developed episode of nausea with vomiting leading to aspiration pneumonia, VDRF, and septic shock 6/23.  PAST MEDICAL HISTORY: HLD, HTN, Endometriosis  SUBJECTIVE:  Remains on pressors.  VITAL SIGNS: BP (!) 102/43   Pulse 83   Temp 98 F (36.7 C) (Oral)   Resp (!) 31   Ht 5' 1.81" (1.57 m)   Wt 139 lb 15.9 oz (63.5 kg)   SpO2 99%   BMI 25.76 kg/m   VENTILATOR SETTINGS: Vent Mode: PSV FiO2 (%):  [40 %-50 %] 40 % Set Rate:  [20 bmp] 20 bmp Vt Set:  [400 mL] 400 mL PEEP:  [8 cmH20] 8 cmH20 Pressure Support:  [5 cmH20] 5 cmH20 Plateau Pressure:  [18 cmH20-19 cmH20] 18 cmH20  INTAKE / OUTPUT: I/O last 3 completed shifts: In: 2979 [I.V.:1141.7; NG/GT:621; IV Piggyback:2728.3] Out: 3510 [Urine:3510]  PHYSICAL EXAMINATION:  General - alert Eyes - pupils reactive ENT - ETT in place Cardiac - regular, no murmur Chest - decreased BS at bases Abd - soft, non tender Ext - no edema Skin - no rashes Neuro - follows commands  LABS:  BMET Recent Labs  Lab 05/11/18 0500 05/12/18 0500 05/13/18 0526  NA 139 141 143  K 3.9 3.6 3.3*  CL 106 109 109  CO2 _0 BUN 22* 26* 23  CREATININE 0.53 0.50 0.46  GLUCOSE 123* 115* 150*    Electrolytes Recent Labs  Lab 05/11/18 0500 05/12/18 0500 05/12/18 1013 05/12/18 1617 05/13/18 0526  CALCIUM 8.5* 7.8*  --   --  7.9*  MG  --   --  1.7  1.7 1.8  PHOS  --   --  2.6 3.0 2.5    CBC Recent Labs  Lab 05/11/18 0500 05/12/18 0500 05/13/18 0526  WBC 9.5 16.3* 12.5*  HGB 8.9* 9.8* 8.2*  HCT 26.8* 29.8* 25.8*  PLT 99* 84* 59*    Coag's Recent Labs  Lab 05/12/18 0500  INR 1.13    Sepsis Markers Recent Labs  Lab 05/12/18 1149  PROCALCITON 5.14    ABG Recent Labs  Lab 05/11/18 1305 05/12/18 0235 05/13/18 0406  PHART 7.199* 7.455* 7.473*  PCO2ART 69.3* 38.5 40.9  PO2ART 112* 130* 89.2    Liver Enzymes Recent Labs  Lab 05/10/18 0952 05/12/18 0500 05/13/18 0526  AST _1 ALT _2 ALKPHOS 48 42 52  BILITOT 0.4 0.6 0.5  ALBUMIN 2.6* 1.9* 2.1*    Cardiac Enzymes No results for input(s): TROPONINI, PROBNP in the last 168 hours.  Glucose Recent Labs  Lab 05/12/18 1146 05/12/18 1604 05/12/18 1941 05/12/18 2319 05/13/18 0437 05/13/18 0748  GLUCAP 175* 189* 150* 148* 128* 132*    Imaging Dg Chest Port 1 View  Result Date: 05/13/2018 CLINICAL DATA:  Follow-up pneumonia EXAM: PORTABLE CHEST 1 VIEW COMPARISON:  05/12/2018 FINDINGS: Endotracheal tube and nasogastric catheter and right-sided chest wall port are again seen  and stable. Cardiac shadow is stable. Aortic calcifications are again noted. Right thyroid calcification is noted as well. Persistent bibasilar infiltrates are noted with associated effusions right greater than left. No bony abnormality is noted. IMPRESSION: Persistent bibasilar changes not significantly improved when compared with the prior exam. Electronically Signed   By: Inez Catalina M.D.   On: 05/13/2018 07:51     STUDIES:  CT chest 5/31 >> 2.7 cm partial calcified mass Rt thyroid lobe, extensive LAN, b/l effusions Rt > Lt, compressive ATX, extensive LAN CT abd/pelvis 5/31 >> splenomegaly R thoracentesis 5/31 >> atypical lymphocytosis noted, aortoiliac atherosclerosis Excisional biopsy left axillary lymph node 6/03 >> angioimmunoblastic T-cell lymphoma, pattern  1 Echo 6/12 >> EF 60 to 65%, small pericardial effusion Bone marrow biopsy 6/17 >> T-cell lymphoma  CULTURES: Blood 6/23 >>  Sputum 6/23 >>   ANTIBIOTICS: Unasyn 6/23 >>   SIGNIFICANT EVENTS: 6/06 Admit 6/17 Oncology consulted 6/18 Transfer from [facility] to [facility] oncology unit 6/19 Start on Pathway regimen (cyclophosphamide, doxorubicin, vincristine, prednisone) 6/20 PRBC transfusion 6/23 VDRF  LINES/TUBES: Port-A-Cath placement 6/19 >>  ETT 6/23 >> 6/25   DISCUSSION: Try on pressure support.  Wean pressors.  F/u CXR >> if pleural effusion progresses, then might need repeat thora to assist with vent weaning.  ASSESSMENT / PLAN:  Acute hypoxic, hypercapnic respiratory failure from aspiration pneumonia. Tobacco abuse. - pressure support wean  - prn BDs  Septic shock from aspiration pneumonia. - wean pressors to keep MAP > 65 - day 3 of unasyn  Malignant pleural effusions. 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 - f/u with oncology - allopurinol to prevent tumor lysis syndrome  Steroid induced hyperglycemia. - SSI  Hypokalemia. - replace as needed  Anemia, thrombocytopenia in setting of malignancy and critical illness. 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 - might need evaluation by vascular surgery when more stable  Acute metabolic encephalopathy. - RASS goal 0  DVT prophylaxis - lovenox SUP - protonix Nutrition - tube feeds Goals of care - full code  Updated pt's son at bedside  CC time 97 minutes  Chesley Mires, MD [place] 05/13/2018, 8:18 AM

## 2018-05-14 ENCOUNTER — Inpatient Hospital Stay (HOSPITAL_COMMUNITY): Payer: Self-pay

## 2018-05-14 LAB — CBC WITH DIFFERENTIAL/PLATELET
BASOS ABS: 0 10*3/uL (ref 0.0–0.1)
Basophils Relative: 0 %
Eosinophils Absolute: 0 10*3/uL (ref 0.0–0.7)
Eosinophils Relative: 1 %
HCT: 25.2 % — ABNORMAL LOW (ref 36.0–46.0)
Hemoglobin: 8.1 g/dL — ABNORMAL LOW (ref 12.0–15.0)
LYMPHS ABS: 0.4 10*3/uL — AB (ref 0.7–4.0)
Lymphocytes Relative: 8 %
MCH: 28.5 pg (ref 26.0–34.0)
MCHC: 32.1 g/dL (ref 30.0–36.0)
MCV: 88.7 fL (ref 78.0–100.0)
MONO ABS: 0 10*3/uL — AB (ref 0.1–1.0)
Monocytes Relative: 1 %
NEUTROS PCT: 90 %
Neutro Abs: 4.4 10*3/uL (ref 1.7–7.7)
PLATELETS: 39 10*3/uL — AB (ref 150–400)
RBC: 2.84 MIL/uL — AB (ref 3.87–5.11)
RDW: 16.1 % — AB (ref 11.5–15.5)
WBC: 4.8 10*3/uL (ref 4.0–10.5)

## 2018-05-14 LAB — COMPREHENSIVE METABOLIC PANEL
ALT: 19 U/L (ref 0–44)
AST: 26 U/L (ref 15–41)
Albumin: 2.2 g/dL — ABNORMAL LOW (ref 3.5–5.0)
Alkaline Phosphatase: 60 U/L (ref 38–126)
Anion gap: 4 — ABNORMAL LOW (ref 5–15)
BILIRUBIN TOTAL: 0.7 mg/dL (ref 0.3–1.2)
BUN: 18 mg/dL (ref 8–23)
CHLORIDE: 106 mmol/L (ref 98–111)
CO2: 31 mmol/L (ref 22–32)
Calcium: 7.9 mg/dL — ABNORMAL LOW (ref 8.9–10.3)
Creatinine, Ser: 0.38 mg/dL — ABNORMAL LOW (ref 0.44–1.00)
Glucose, Bld: 104 mg/dL — ABNORMAL HIGH (ref 70–99)
Potassium: 3.3 mmol/L — ABNORMAL LOW (ref 3.5–5.1)
Sodium: 141 mmol/L (ref 135–145)
TOTAL PROTEIN: 5.6 g/dL — AB (ref 6.5–8.1)

## 2018-05-14 LAB — CULTURE, RESPIRATORY W GRAM STAIN
Culture: NORMAL
Gram Stain: NONE SEEN

## 2018-05-14 LAB — GLUCOSE, CAPILLARY
GLUCOSE-CAPILLARY: 126 mg/dL — AB (ref 70–99)
GLUCOSE-CAPILLARY: 122 mg/dL — AB (ref 70–99)
GLUCOSE-CAPILLARY: 117 mg/dL — AB (ref 70–99)
GLUCOSE-CAPILLARY: 140 mg/dL — AB (ref 70–99)

## 2018-05-14 LAB — URIC ACID: Uric Acid, Serum: 3.4 mg/dL (ref 2.5–7.1)

## 2018-05-14 LAB — HEPARIN LEVEL (UNFRACTIONATED): HEPARIN UNFRACTIONATED: 0.37 [IU]/mL (ref 0.30–0.70)

## 2018-05-14 MED ORDER — ADULT MULTIVITAMIN W/MINERALS CH
1.0000 | ORAL_TABLET | Freq: Every day | ORAL | Status: DC
  Administered 2018-05-14 – 2018-05-19 (×6): 1 via ORAL
  Filled 2018-05-14 (×6): qty 1

## 2018-05-14 MED ORDER — AMOXICILLIN-POT CLAVULANATE 875-125 MG PO TABS
1.0000 | ORAL_TABLET | Freq: Two times a day (BID) | ORAL | Status: AC
  Administered 2018-05-14 – 2018-05-16 (×6): 1 via ORAL
  Filled 2018-05-14 (×6): qty 1

## 2018-05-14 MED ORDER — ORAL CARE MOUTH RINSE
15.0000 mL | Freq: Two times a day (BID) | OROMUCOSAL | Status: DC
  Administered 2018-05-15 – 2018-05-19 (×5): 15 mL via OROMUCOSAL

## 2018-05-14 NOTE — Progress Notes (Signed)
PULMONARY / CRITICAL CARE MEDICINE   Name: Deborah Cooley MRN: [id number] DOB: Jan 05, 1956    ADMISSION DATE:  04/23/2018 CONSULTATION DATE:  05/11/18  REFERRING MD:  Dr Zigmund Daniel, [facility]  CHIEF COMPLAINT:  Acute respiratory failure   HISTORY OF PRESENT ILLNESS:   62 yo female smoker admitted with abdominal pain, cough, and lymphadenopathy.  Found to have b/l pleural effusions.  Rt thoracentesis 5/31 showed atypical lymphocytes.  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  Started on CHOP 6/20.  Had repeat thoracentesis 6/23.  Developed episode of nausea with vomiting leading to aspiration pneumonia, VDRF, and septic shock 6/23.  PAST MEDICAL HISTORY: HLD, HTN, Endometriosis  SUBJECTIVE:  Denies chest pain.  Breathing okay.  VITAL SIGNS: BP (!) 133/53   Pulse 83   Temp (!) 97.4 F (36.3 C) (Axillary)   Resp (!) 24   Ht 5' 1.81" (1.57 m)   Wt 139 lb 12.4 oz (63.4 kg)   SpO2 97%   BMI 25.72 kg/m   INTAKE / OUTPUT: I/O last 3 completed shifts: In: 2111.7 [P.O.:720; I.V.:565.7; NG/GT:407.7; IV Piggyback:418.3] Out: 1350 [Urine:1350]  PHYSICAL EXAMINATION:  General - pleasant Eyes - pupils reactive ENT - no sinus tenderness, no oral exudate, no LAN Cardiac - regular, no murmur, port Rt chest Chest - no wheeze, rales Abd - soft, non tender Ext - 1+ edema Rt arm Skin - no rashes Neuro - normal strength Psych - normal mood  LABS:  BMET Recent Labs  Lab 05/12/18 0500 05/13/18 0526 05/14/18 0324  NA 141 143 141  K 3.6 3.3* 3.3*  CL 109 109 106  CO2 _0 BUN 26* 23 18  CREATININE 0.50 0.46 0.38*  GLUCOSE 115* 150* 104*    Electrolytes Recent Labs  Lab 05/12/18 0500  05/12/18 1617 05/13/18 0526 05/13/18 1545 05/14/18 0324  CALCIUM 7.8*  --   --  7.9*  --  7.9*  MG  --    < > 1.7 1.8 1.6*  --   PHOS  --    < > 3.0 2.5 2.3*  --    < > = values in this interval not displayed.    CBC Recent  Labs  Lab 05/13/18 0526 05/13/18 2232 05/14/18 0324  WBC 12.5* 6.1 4.8  HGB 8.2* 8.3* 8.1*  HCT 25.8* 25.2* 25.2*  PLT 59* 42* 39*    Coag's Recent Labs  Lab 05/12/18 0500  INR 1.13    Sepsis Markers Recent Labs  Lab 05/12/18 1149 05/13/18 0948  PROCALCITON 5.14 2.94    ABG Recent Labs  Lab 05/11/18 1305 05/12/18 0235 05/13/18 0406  PHART 7.199* 7.455* 7.473*  PCO2ART 69.3* 38.5 40.9  PO2ART 112* 130* 89.2    Liver Enzymes Recent Labs  Lab 05/12/18 0500 05/13/18 0526 05/14/18 0324  AST _1 ALT _2 ALKPHOS 42 52 60  BILITOT 0.6 0.5 0.7  ALBUMIN 1.9* 2.1* 2.2*    Cardiac Enzymes No results for input(s): TROPONINI, PROBNP in the last 168 hours.  Glucose Recent Labs  Lab 05/13/18 0437 05/13/18 0748 05/13/18 1135 05/13/18 1553 05/13/18 2128 05/14/18 0736  GLUCAP 128* 132* 136* 98 122* 126*    Imaging No results found.   STUDIES:  CT chest 5/31 >> 2.7 cm partial calcified mass Rt thyroid lobe, extensive LAN, b/l effusions Rt > Lt, compressive ATX, extensive LAN CT abd/pelvis 5/31 >> splenomegaly R thoracentesis 5/31 >> atypical lymphocytosis  noted, aortoiliac atherosclerosis Excisional biopsy left axillary lymph node 6/03 >> angioimmunoblastic T-cell lymphoma, pattern 1 Echo 6/12 >> EF 60 to 65%, small pericardial effusion Bone marrow biopsy 6/17 >> T-cell lymphoma Doppler u/s Rt arm 6/25 >> acute DVT Rt subclavian vein  CULTURES: Blood 6/23 >>  Sputum 6/23 >>   ANTIBIOTICS: Unasyn 6/23 >> 6/26 Augmentin 6/26 >>  SIGNIFICANT EVENTS: 6/06 Admit 6/17 Oncology consulted 6/18 Transfer from [facility] to [facility] oncology unit 6/19 Start on Pathway regimen (cyclophosphamide, doxorubicin, vincristine, prednisone) 6/20 PRBC transfusion 6/23 VDRF 6/25 extubate; start heparin gtt for RT [place] DVT  LINES/TUBES: Port-A-Cath placement 6/19 >>  ETT 6/23 >> 6/25   DISCUSSION: Successfully extubated 6/25.  CXR appears stable to improving.   Has Rt [place] DVT and low PLT count >> continue heparin gtt, and f/u CBC.  Difficult IV stick and will be needing additional chemo in July, so will keep port in.  Can progress to SDU status, and start working with PT.  Will check if she needs additional f/u with speech before advancing diet.  ASSESSMENT / PLAN:  Acute hypoxic, hypercapnic respiratory failure from aspiration pneumonia. Tobacco abuse. - oxygen to keep SpO2 > 92% - prn BDs - bronchial hygiene  Septic shock from aspiration pneumonia. - off pressors 6/25 - change to augmentin 6/26 >> plan stop date 6/28  Malignant pleural effusions. 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 - followed by Dr. Irene Limbo with oncology - allopurinol to prevent tumor lysis syndrome  Steroid induced hyperglycemia. - SSI  Hypokalemia. - replace as needed  Anemia, thrombocytopenia in setting of malignancy and critical illness. - f/u CBC - completed course of granix  Rt subclavian DVT. 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 - might need evaluation by vascular surgery when more stable  Acute metabolic encephalopathy. - resolved  Deconditioning. - PT assessment  Dysphagia. - f/u with speech and then advance diet  DVT prophylaxis - heparin gtt SUP - d/c pepcid, no longer indicated Nutrition - clear liquids Goals of care - full code  Updated family at bedside  Transfer to SDU 6/26 >> To [facility] 6/27 and PCCM off.  Chesley Mires, MD [facility] Pulmonary/Critical Care 05/14/2018, 8:04 AM

## 2018-05-14 NOTE — Progress Notes (Signed)
[place] for heparin  Indication: DVT  No Known Allergies  Patient Measurements: Height: 5' 1.81" (157 cm) Weight: 139 lb 15.9 oz (63.5 kg) IBW/kg (Calculated) : 49.67 Heparin Dosing Weight: 63 kg  Vital Signs: Temp: 99.7 F (37.6 C) (06/25 2000) Temp Source: Oral (06/25 2000) BP: 130/49 (06/25 2300)  Labs: Recent Labs    05/11/18 0500 05/12/18 0500 05/13/18 0526 05/13/18 2232  HGB 8.9* 9.8* 8.2* 8.3*  HCT 26.8* 29.8* 25.8* 25.2*  PLT 99* 84* 59* 42*  LABPROT  --  14.4  --   --   INR  --  1.13  --   --   HEPARINUNFRC  --   --   --  0.42  CREATININE 0.53 0.50 0.46  --     Estimated Creatinine Clearance: 63.5 mL/min (by C-G formula based on SCr of 0.46 mg/dL).  Assessment: Patient diagnosed with right upper extremity DVT and consult entered for heparin drip. Patient has stage III/IV angioimmunoblastic T cell lymphoma CD30+ being followed by oncology. Patient is thrombocytopenia s/p C1 CHOP on 05/08/18 and will need to be closely monitored for any signs/symptoms of bleeding.  05/14/2018  First heparin level therapeutic (0.42) drawn 6 hours after heparin drip started at 1000 units/hr with no bolus 2nd low PLTC.  Hg stable at 8.3 and PLTC 42.  No bleeding reported. Pt difficult stick for labs.    Goal of Therapy:  Heparin level 0.3-0.7 units/ml Monitor platelets by anticoagulation protocol: Yes   Plan:  Continue heparin drip at 1000 units/hr Daily HL and CBC while on heparin Consider changing to LMWH if no invasive procedures planned   Eudelia Bunch, Pharm.D. [phone] 05/14/2018 12:34 AM

## 2018-05-14 NOTE — Progress Notes (Signed)
  Speech Language Pathology Treatment: Dysphagia  Patient Details Name: Deborah Cooley MRN: [id number] DOB: [birth year] Today's Date: 05/14/2018 Time: 8469-6295 SLP Time Calculation (min) (ACUTE ONLY): 25 min  Assessment / Plan / Recommendation Clinical Impression  Pt with continued improved phonation (self reports level 8 of 10, yesterday self reported level 7 of 10).  Pt is edentulous but self regulates diet. No increased WOB with intake noted, swallow was timely with no indication of airway compromise.  Recommend advance diet to regular/thin diet.    Of note, pt does have a few white patches on her tongue - SLP took picture and sent to pt's phone (deleted pic on SLP phone after) to allow pt to self monitor.  She denies lingual pain - hopefully she is not developing mucositis.  Pt does not use straws typically but advised if lingual pain/blistering presents, use of straw may be beneficial.  Educated pt using teach back.   No SlP follow up indicated as all educated completed. Thanks so much for this referral.   HPI HPI: 62 yo female smoker admitted with abdominal pain, cough, and lymphadenopathy.  Found to have b/l pleural effusions.  Rt thoracentesis 5/31 showed atypical lymphocytes.  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  Started on CHOP 6/20.  Had repeat thoracentesis 6/23.  Developed episode of nausea with vomiting leading to aspiration pneumonia, VDRF, and septic shock 6/23.      SLP Plan  All goals met       Recommendations  Diet recommendations: Regular;Thin liquid Medication Administration: (as tolerated) Supervision: Patient able to self feed Compensations: Slow rate;Small sips/bites Postural Changes and/or Swallow Maneuvers: Seated upright 90 degrees;Upright 30-60 min after meal                Oral Care Recommendations: Oral care BID SLP Visit Diagnosis: Dysphagia, unspecified (R13.10) Plan: All goals  met       GO                Deborah Cooley 05/14/2018, 8:51 AM Deborah Cooley, [facility] SLP [phone]

## 2018-05-14 NOTE — Progress Notes (Signed)
[place] for heparin  Indication: DVT  No Known Allergies  Patient Measurements: Height: 5' 1.81" (157 cm) Weight: 139 lb 12.4 oz (63.4 kg) IBW/kg (Calculated) : 49.67 Heparin Dosing Weight: 63 kg  Vital Signs: Temp: 97.4 F (36.3 C) (06/26 0747) Temp Source: Axillary (06/26 0747) BP: 133/53 (06/26 0600)  Labs: Recent Labs    05/12/18 0500 05/13/18 0526 05/13/18 2232 05/14/18 0324  HGB 9.8* 8.2* 8.3* 8.1*  HCT 29.8* 25.8* 25.2* 25.2*  PLT 84* 59* 42* 39*  LABPROT 14.4  --   --   --   INR 1.13  --   --   --   HEPARINUNFRC  --   --  0.42 0.37  CREATININE 0.50 0.46  --  0.38*    Estimated Creatinine Clearance: 63.5 mL/min (A) (by C-G formula based on SCr of 0.38 mg/dL (L)).  Assessment: Patient diagnosed with right upper extremity DVT 6/25 and consult entered for heparin drip. Patient has stage III/IV angioimmunoblastic T cell lymphoma CD30+ being followed by oncology. Patient is thrombocytopenia s/p C1 CHOP on 05/08/18 and will need to be closely monitored for any signs/symptoms of bleeding.  Today, 05/14/2018  Confirmatory heparin level therapeutic - keeping on low end heparin level goal with thrombocytopenia  CBC: Hgb decreased but stable, pltc trending down - likely myelosuppression due to chemo  No obvious bleeding  Goal of Therapy:  Heparin level 0.3-0.7 units/ml, preferably keep heparin level 0.3-0.5 with thrombocytopenia Monitor platelets by anticoagulation protocol: Yes   Plan:   Continue heparin drip at 1000 units/hr  Daily HL and CBC while on heparin  Follow platelet count and potential need to stop heparin gtt if worsens or develops bleeding  Doreene Eland, PharmD, BCPS.   Pager: [phone] 05/14/2018 8:17 AM

## 2018-05-15 LAB — CBC
HCT: 21.5 % — ABNORMAL LOW (ref 36.0–46.0)
HEMATOCRIT: 22.4 % — AB (ref 36.0–46.0)
HEMOGLOBIN: 7.1 g/dL — AB (ref 12.0–15.0)
HEMOGLOBIN: 7.5 g/dL — AB (ref 12.0–15.0)
MCH: 28.5 pg (ref 26.0–34.0)
MCH: 28.7 pg (ref 26.0–34.0)
MCHC: 33 g/dL (ref 30.0–36.0)
MCHC: 33.3 g/dL (ref 30.0–36.0)
MCV: 86.3 fL (ref 78.0–100.0)
MCV: 86.2 fL (ref 78.0–100.0)
Platelets: 29 10*3/uL — CL (ref 150–400)
Platelets: 25 10*3/uL — CL (ref 150–400)
RBC: 2.49 MIL/uL — AB (ref 3.87–5.11)
RBC: 2.61 MIL/uL — AB (ref 3.87–5.11)
RDW: 16.2 % — AB (ref 11.5–15.5)
RDW: 15.9 % — ABNORMAL HIGH (ref 11.5–15.5)
WBC: 1 10*3/uL — AB (ref 4.0–10.5)
WBC: 0.7 10*3/uL — CL (ref 4.0–10.5)

## 2018-05-15 LAB — DIFFERENTIAL
BASOS ABS: 0 10*3/uL (ref 0.0–0.1)
BASOS PCT: 2 %
EOS ABS: 0 10*3/uL (ref 0.0–0.7)
EOS PCT: 5 %
Lymphocytes Relative: 35 %
Lymphs Abs: 0.2 10*3/uL (ref 0.7–4.0)
Monocytes Absolute: 0 10*3/uL (ref 0.1–1.0)
Monocytes Relative: 2 %
NEUTROS PCT: 56 %
Neutro Abs: 0.4 10*3/uL (ref 1.7–7.7)

## 2018-05-15 LAB — MAGNESIUM: Magnesium: 1.6 mg/dL — ABNORMAL LOW (ref 1.7–2.4)

## 2018-05-15 LAB — HEMOGLOBIN AND HEMATOCRIT, BLOOD
HEMATOCRIT: 26.6 % — AB (ref 36.0–46.0)
HEMOGLOBIN: 9.1 g/dL — AB (ref 12.0–15.0)

## 2018-05-15 LAB — BASIC METABOLIC PANEL
Anion gap: 2 — ABNORMAL LOW (ref 5–15)
BUN: 14 mg/dL (ref 8–23)
CALCIUM: 7.7 mg/dL — AB (ref 8.9–10.3)
CO2: 32 mmol/L (ref 22–32)
Chloride: 104 mmol/L (ref 98–111)
Creatinine, Ser: 0.35 mg/dL — ABNORMAL LOW (ref 0.44–1.00)
GFR calc Af Amer: >60 mL/min (ref >60)
GLUCOSE: 114 mg/dL — AB (ref 70–99)
Potassium: 3.1 mmol/L — ABNORMAL LOW (ref 3.5–5.1)
Sodium: 138 mmol/L (ref 135–145)

## 2018-05-15 LAB — GLUCOSE, CAPILLARY
GLUCOSE-CAPILLARY: 143 mg/dL — AB (ref 70–99)
Glucose-Capillary: 125 mg/dL — ABNORMAL HIGH (ref 70–99)

## 2018-05-15 LAB — HEPARIN LEVEL (UNFRACTIONATED): HEPARIN UNFRACTIONATED: 0.18 [IU]/mL — AB (ref 0.30–0.70)

## 2018-05-15 LAB — URIC ACID: Uric Acid, Serum: 3.2 mg/dL (ref 2.5–7.1)

## 2018-05-15 LAB — PREPARE RBC (CROSSMATCH)

## 2018-05-15 MED ORDER — POTASSIUM CHLORIDE CRYS ER 20 MEQ PO TBCR
40.0000 meq | EXTENDED_RELEASE_TABLET | ORAL | Status: AC
  Administered 2018-05-15 (×2): 40 meq via ORAL
  Filled 2018-05-15 (×2): qty 2

## 2018-05-15 MED ORDER — TBO-FILGRASTIM 480 MCG/0.8ML ~~LOC~~ SOSY
480.0000 ug | PREFILLED_SYRINGE | Freq: Every day | SUBCUTANEOUS | Status: DC
  Administered 2018-05-15 – 2018-05-18 (×4): 480 ug via SUBCUTANEOUS
  Filled 2018-05-15 (×5): qty 0.8

## 2018-05-15 MED ORDER — MAGNESIUM SULFATE 4 GM/100ML IV SOLN
4.0000 g | Freq: Once | INTRAVENOUS | Status: AC
  Administered 2018-05-15: 4 g via INTRAVENOUS
  Filled 2018-05-15: qty 100

## 2018-05-15 MED ORDER — ZOLPIDEM TARTRATE 5 MG PO TABS
5.0000 mg | ORAL_TABLET | Freq: Once | ORAL | Status: AC
Start: 2018-05-15 — End: 2018-05-15
  Administered 2018-05-15: 5 mg via ORAL
  Filled 2018-05-15: qty 1

## 2018-05-15 MED ORDER — SODIUM CHLORIDE 0.9% IV SOLUTION
Freq: Once | INTRAVENOUS | Status: AC
  Administered 2018-05-15: 10:00:00 via INTRAVENOUS

## 2018-05-15 NOTE — Progress Notes (Signed)
eLink Physician-Brief Progress Note Patient Name: Deborah Cooley DOB: 02-09-1956 MRN: [id number]   Date of Service  05/15/2018  HPI/Events of Note  Platelet count down to 29.000. Patient is on Heparin infusion for DVT.  eICU Interventions  Will d/c Heparin infusion pending PCCM rounder physician re-evaluation this AM. Will check HIT panel although likely thrombocytopenia is related to patients neoplasm.        Kerry Kass Ogan 05/15/2018, 4:16 AM

## 2018-05-15 NOTE — Progress Notes (Addendum)
CRITICAL VALUE ALERT  Critical Value:  WBC 1.0     PLT 29  Date & Time Notied:  05/15/2018 @0407   Provider Notified: MD Ogan  Orders Received/Actions taken: D/c Heparin, heparin gtt stopped

## 2018-05-15 NOTE — Progress Notes (Signed)
HEMATOLOGY/ONCOLOGY INPATIENT PROGRESS NOTE  Date of Service: 05/15/2018  Inpatient Attending: .Debbe Odea, MD   SUBJECTIVE:   Deborah Cooley  Was seen in the MICU, accompanied today by her husband. The pt reports that she is doing well better overall.  She notes that her breathing has improved and she hasn't felt any overt concerns with breathing. Diet is advancing. She notes that she hasn't been eating very well but intends to do better with this.  Blood counts appear to be nadiring.  Lab results today (05/15/18) of CBC is as follows: all values are WNL except for WBC at 700, RBC at 2.61, HGB at 7.5, HCT at 22.4, RDW at 15.9, PLT at 25k.  On Granix. Receiving PRBC transfusion today.  On review of systems, pt reports improved breathing, weak appetite, moving her bowels well, and denies fevers, blood in the stools, black stools, abdominal pains, and any other symptoms.   OBJECTIVE:  NAD  PHYSICAL EXAMINATION: . Vitals:   05/15/18 1300 05/15/18 1334 05/15/18 1400 05/15/18 1557  BP: (!) 119/38 (!) 103/44 (!) 119/44 (!) 131/47  Pulse:      Resp: (!) 25 (!) 28 19 (!) 27  Temp:  (!) 96.4 F (35.8 C)  99.2 F (37.3 C)  TempSrc:  Axillary  Oral  SpO2: 96% 97% 97% 93%  Weight:      Height:       Filed Weights   05/13/18 0600 05/14/18 0600 05/15/18 0600  Weight: 139 lb 15.9 oz (63.5 kg) 139 lb 12.4 oz (63.4 kg) 136 lb 7.4 oz (61.9 kg)   .Body mass index is 25.11 kg/m.  GENERAL:alert, awake, off ventilator now SKIN: no acute rashes, no significant lesions EYES: conjunctiva are pink and non-injected, sclera anicteric OROPHARYNX: MMM, no exudates, no oropharyngeal erythema or ulceration NECK: supple, no JVD LYMPH:  no palpable lymphadenopathy in the cervical, axillary or inguinal regions LUNGS: coarse breathing sounds b/l bases HEART: regular rate & rhythm ABDOMEN:  normoactive bowel sounds , non tender, not distended. Extremity: 2+ pedal edema PSYCH: alert & oriented  x 3 with fluent speech NEURO: no focal motor/sensory deficits   MEDICAL HISTORY:  Past Medical History:  Diagnosis Date  . Bilateral pleural effusion 04/17/2018  . Endometriosis   . Essential hypertension, benign   . Family history of adverse reaction to anesthesia    "sister stops breathing I think" (04/18/2018)  . Heart murmur   . Mixed hyperlipidemia   . Pneumonia 04/17/2018    SURGICAL HISTORY: Past Surgical History:  Procedure Laterality Date  . ABDOMINAL HYSTERECTOMY  1970s/1980s   for endometriosis  . Arthroscopic right shoulder surgery    . AXILLARY LYMPH NODE BIOPSY Left 04/21/2018   Procedure: LEFT AXILLARY LYMPH NODE BIOPSY;  Surgeon: Kieth Brightly, Arta Bruce, MD;  Location: [facility];  Service: General;  Laterality: Left;  . CARPAL TUNNEL RELEASE Right   . GANGLION CYST EXCISION Right   . IR IMAGING GUIDED PORT INSERTION  05/07/2018  . MULTIPLE TOOTH EXTRACTIONS    . OOPHORECTOMY  1997  . PARTIAL HYSTERECTOMY  1984  . Right hand surgery    . SHOULDER OPEN ROTATOR CUFF REPAIR Right   . VESICOVAGINAL FISTULA CLOSURE W/ TAH      SOCIAL HISTORY: Social History   Socioeconomic History  . Marital status: Married    Spouse name: Not on file  . Number of children: Not on file  . Years of education: Not on file  . Highest education level: Not on  file  Occupational History  . Occupation: Social research officer, government    Comment: works at [employer]  . Financial resource strain: Not on file  . Food insecurity:    Worry: Not on file    Inability: Not on file  . Transportation needs:    Medical: Not on file    Non-medical: Not on file  Tobacco Use  . Smoking status: Current Every Day Smoker    Packs/day: 1.50    Years: 46.00    Pack years: 69.00    Types: Cigarettes  . Smokeless tobacco: Never Used  Substance and Sexual Activity  . Alcohol use: Not Currently    Comment: 04/18/2018 "quit years ago"  . Drug use: Not Currently    Comment: "back in my 52s"    . Sexual activity: Not Currently  Lifestyle  . Physical activity:    Days per week: Not on file    Minutes per session: Not on file  . Stress: Not on file  Relationships  . Social connections:    Talks on phone: Not on file    Gets together: Not on file    Attends religious service: Not on file    Active member of club or organization: Not on file    Attends meetings of clubs or organizations: Not on file    Relationship status: Not on file  . Intimate partner violence:    Fear of current or ex partner: Not on file    Emotionally abused: Not on file    Physically abused: Not on file    Forced sexual activity: Not on file  Other Topics Concern  . Not on file  Social History Narrative   ** Merged History Encounter **       Patient has one son whom is healthy.   Has a live in boyfriend.    FAMILY HISTORY: Family History  Problem Relation Age of Onset  . Diabetes Mother        age 76  . Heart attack Father        died age 28's  . Hypertension Father   . Cancer Sister        breast cancer diagonsed age 70 years  . Diabetes Unknown   . Heart disease Unknown        Female <55  . Arthritis Unknown   . Asthma Unknown   . Hyperlipidemia Other        several siblings  . Thyroid disease Sister        one sister with thyroid disease    ALLERGIES:  has No Known Allergies.  MEDICATIONS:  Scheduled Meds: . allopurinol  300 mg Oral Daily  . amoxicillin-clavulanate  1 tablet Oral Q12H  . Chlorhexidine Gluconate Cloth  6 each Topical Daily  . mouth rinse  15 mL Mouth Rinse BID  . multivitamin with minerals  1 tablet Oral Daily  . potassium chloride  40 mEq Oral Q4H  . sodium chloride flush  10-40 mL Intracatheter Q12H  . sodium chloride flush  3 mL Intravenous Q12H  . Tbo-filgastrim (GRANIX) SQ  480 mcg Subcutaneous q1800   Continuous Infusions: . sodium chloride     PRN Meds:.acetaminophen **OR** acetaminophen, heparin lock flush, ipratropium-albuterol, polyethylene  glycol, prochlorperazine, sodium chloride flush, sodium chloride flush, sodium chloride flush  REVIEW OF SYSTEMS:    A 10+ POINT REVIEW OF SYSTEMS WAS OBTAINED including neurology, dermatology, psychiatry, cardiac, respiratory, lymph, extremities, GI, GU, Musculoskeletal, constitutional, breasts,  reproductive, HEENT.  All pertinent positives are noted in the HPI.  All others are negative.    LABORATORY DATA:  I have reviewed the data as listed  . CBC Latest Ref Rng & Units 05/15/2018 05/15/2018 05/14/2018  WBC 4.0 - 10.5 K/uL 0.7(LL) 1.0(LL) 4.8  Hemoglobin 12.0 - 15.0 g/dL 7.5(L) 7.1(L) 8.1(L)  Hematocrit 36.0 - 46.0 % 22.4(L) 21.5(L) 25.2(L)  Platelets 150 - 400 K/uL 25(LL) 29(LL) 39(L)    . CMP Latest Ref Rng & Units 05/15/2018 05/14/2018 05/13/2018  Glucose 70 - 99 mg/dL 114(H) 104(H) 150(H)  BUN 8 - 23 mg/dL _0 Creatinine 0.44 - 1.00 mg/dL 0.35(L) 0.38(L) 0.46  Sodium 135 - 145 mmol/L 138 141 143  Potassium 3.5 - 5.1 mmol/L 3.1(L) 3.3(L) 3.3(L)  Chloride 98 - 111 mmol/L 104 106 109  CO2 22 - 32 mmol/L 32 31 31  Calcium 8.9 - 10.3 mg/dL 7.7(L) 7.9(L) 7.9(L)  Total Protein 6.5 - 8.1 g/dL - 5.6(L) 5.4(L)  Total Bilirubin 0.3 - 1.2 mg/dL - 0.7 0.5  Alkaline Phos 38 - 126 U/L - 60 52  AST 15 - 41 U/L - 26 19  ALT 0 - 44 U/L - 19 15   Component     Latest Ref Rng & Units 05/12/2018  Magnesium     1.7 - 2.4 mg/dL 1.7  Phosphorus     2.5 - 4.6 mg/dL 3.0  Sed Rate     0 - 22 mm/hr 115 (H)  CRP     <1.0 mg/dL 5.2 (H)    RADIOGRAPHIC STUDIES: I have personally reviewed the radiological images as listed and agreed with the findings in the report.   [place] UE 05/13/2018  Final Interpretation:  Right: Evidence of acute DVT in the subclavian vein. Pulsatile venous flow is suggestive of elevated right sided heart pressure.  Left: No evidence of thrombosis in the subclavian.  Dg Chest 1 View  Result Date: 05/11/2018 CLINICAL DATA:  62 y/o F; status post intubation.  Recent thoracentesis. EXAM: CHEST  1 VIEW COMPARISON:  05/11/2018 chest radiograph. FINDINGS: Stable right port catheter tip projecting over mid SVC. Endotracheal tube tip projects 2 cm above carina. Enteric tube tip extends below field of view into the abdomen. Transcutaneous pacing pads noted. Increased diffuse hazy opacities of the lungs and stable basilar consolidations. No pneumothorax. Bilateral pleural effusions. Bones are unremarkable. No pneumothorax. IMPRESSION: 1. Endotracheal tube tip 2 cm above carina. 2. Enteric tube tip below field of view in the abdomen. 3. Increased diffuse hazy opacities of the lungs and stable basilar consolidation. Bilateral effusions. Electronically Signed   By: Kristine Garbe M.D.   On: 05/11/2018 13:34   Dg Chest 1 View  Result Date: 05/11/2018 CLINICAL DATA:  Post thoracentesis now with worsening cough, shortness of breath and vomiting. EXAM: CHEST  1 VIEW COMPARISON:  Earlier same day; 05/09/2018; 05/08/2018 FINDINGS: Interval development of recurrent right basilar consolidative suspected airspace opacities now with partial obscuration of the right heart border. Otherwise, grossly unchanged cardiac silhouette and mediastinal contours with atherosclerotic plaque when the thoracic aorta. Unchanged small to moderate size left-sided effusion associated left basilar consolidative opacities. Pulmonary vasculature remains distinct with cephalization of flow. No pneumothorax. Stable position of support apparatus. Known peripherally calcified thyroid nodule overlies the thoracic inlet. No acute osseus abnormalities. IMPRESSION: 1. Interval development of recurrent right basilar consolidative airspace opacities post recent large volume right-sided thoracentesis - differential considerations are broad though given provided history of vomiting, findings  are concerning for aspiration with additional differential considerations including re-expansion alveolar pulmonary edema  and (less likely) pulmonary hemorrhage. 2. Grossly unchanged small to moderate size left-sided effusion. 3. Persistent findings of pulmonary venous congestion/mild pulmonary edema. Above findings discussed with rapid response team at the time of image acquisition. Electronically Signed   By: Sandi Mariscal M.D.   On: 05/11/2018 11:50   Dg Chest 1 View  Result Date: 05/11/2018 CLINICAL DATA:  Post right-sided thoracentesis EXAM: CHEST  1 VIEW COMPARISON:  05/09/2018 FINDINGS: Interval reduction/near resolution of residual trace right-sided effusion post thoracentesis. No pneumothorax. No change to slight increase in small to moderate size left-sided effusion. Improved aeration of lung base with persistent right basilar heterogeneous opacities. Unchanged left basilar consolidative opacities. Pulmonary vasculature remains indistinct with cephalization of flow and nodular thickening of the pulmonary interstitium. Grossly unchanged cardiac silhouette and mediastinal contours with atherosclerotic plaque within the thoracic aorta. Calcification overlying the right-sided the thoracic inlet is unchanged and compatible with known peripherally calcified thyroid nodule. Stable position of support apparatus. No acute osseus abnormalities. IMPRESSION: 1. Interval reduction/near resolution of residual trace effusion post thoracentesis. No pneumothorax. 2. No change to slight increase in small to moderate size left-sided pleural effusion. 3. Overall improved aeration of lungs with persistent findings of pulmonary venous congestion/mild pulmonary edema. Electronically Signed   By: Sandi Mariscal M.D.   On: 05/11/2018 10:44   Dg Chest 1 View  Result Date: 05/08/2018 CLINICAL DATA:  Wheezing EXAM: CHEST  1 VIEW COMPARISON:  Chest x-ray of 04/23/2017 FINDINGS: There has been increase in volume of right pleural effusion with increasing right basilar atelectasis. A smaller left pleural effusion remains with probable partial atelectasis  at the left lung base as well. The heart is mildly enlarged. A Port-A-Cath is now present with the tip overlying the mid lower SVC. No pneumothorax is seen. No bony abnormality is noted. IMPRESSION: 1. Increase in volume of right pleural effusion with increasing right basilar atelectasis. 2. Small left pleural effusion remains with probable partial atelectasis of the left lung base as well. 3. Port-A-Cath tip overlies the mid lower SVC. Electronically Signed   By: Ivar Drape M.D.   On: 05/08/2018 11:51   Dg Chest 2 View  Result Date: 05/09/2018 CLINICAL DATA:  Short of breath for 2 days. Cough yesterday but not today. Followup pleural effusion. EXAM: CHEST - 2 VIEW COMPARISON:  07/08/2018 and older exams. FINDINGS: Moderate to large right pleural effusion obscures the right hemidiaphragm and right heart border. Small to moderate left pleural effusion obscures the left hemidiaphragm. There are prominent vascular markings bilaterally without evidence of pulmonary edema. No evidence of pneumonia. No pneumothorax. Cardiac silhouette is normal in size. No mediastinal or hilar masses. Right anterior chest wall Port-A-Cath is stable. IMPRESSION: 1. Moderate to large right and small to moderate left pleural effusions similar to the previous day's study. No evidence of pneumonia or pulmonary edema. Electronically Signed   By: Lajean Manes M.D.   On: 05/09/2018 15:02   Dg Chest 2 View  Result Date: 04/23/2018 CLINICAL DATA:  Recent thoracentesis, worsening shortness of breath would like swelling. Productive cough. History of pleural effusion, pneumonia, left axillary node biopsy April 21, 2018. EXAM: CHEST - 2 VIEW COMPARISON:  Chest radiograph April 19, 2018 and CT chest Apr 18, 2018 FINDINGS: Increasing small bilateral pleural effusions. Diffuse interstitial prominence. Increased lung volumes and flattened hemidiaphragms. Worsening patchy right middle lobe airspace opacity. Left lung base atelectasis. Cardiac  silhouette  mildly enlarged. Calcified aortic arch. No pneumothorax. Calcified right thyroid nodule. Soft tissue planes and included osseous structures are not suspicious. IMPRESSION: Increasing small bilateral pleural effusions. Worsening right middle lobe airspace opacity with left lung base atelectasis. Mild cardiomegaly.  COPD. Aortic Atherosclerosis (ICD10-I70.0). Electronically Signed   By: Elon Alas M.D.   On: 04/23/2018 21:15   Dg Chest 2 View  Result Date: 04/19/2018 CLINICAL DATA:  Productive cough and shortness of breath for 1 week. EXAM: CHEST - 2 VIEW COMPARISON:  PA and lateral chest 04/17/2018. CT chest, abdomen and pelvis 04/18/2018. FINDINGS: Small bilateral pleural effusions are seen, greater on the right. Basilar atelectasis is noted. No consolidative process or pneumothorax. Heart size is normal. Aortic atherosclerosis is noted. Calcified lesion in the right lobe of the thyroid is noted. No acute bony abnormality. IMPRESSION: Small bilateral pleural effusions and mild basilar atelectasis. Atherosclerosis. Electronically Signed   By: Inge Rise M.D.   On: 04/19/2018 13:55   Dg Chest 2 View  Result Date: 04/17/2018 CLINICAL DATA:  Abdominal pain.  Shortness of breath. EXAM: CHEST - 2 VIEW COMPARISON:  None. FINDINGS: The heart size is normal. The hila and mediastinum are unremarkable. A 2.2 cm eggshell calcification is seen in the right thoracic inlet. No pneumothorax. Mild interstitial prominence bilaterally. Small bilateral pleural effusions. No nodules or masses. No other acute abnormalities. IMPRESSION: 1. Mild interstitial opacities in the lungs are nonspecific. Edema could have this appearance but the lack of cardiomegaly would be unusual. Atypical infection is possible. 2. Small bilateral effusions. 3. The eggshell calcification in the right thoracic inlet is nonspecific. This could be a vascular structure or a calcified nodule in the thyroid. Electronically Signed   By:  Dorise Bullion III M.D   On: 04/17/2018 23:32   Ct Angio Chest Pe W/cm &/or Wo Cm  Addendum Date: 04/18/2018   ADDENDUM REPORT: 04/18/2018 08:59 ADDENDUM: Add to IMPRESSION: There is wall thickening in the urinary bladder with mild soft tissue stranding adjacent to the bladder. Suspect a degree of cystitis. Electronically Signed   By: Lowella Grip III M.D.   On: 04/18/2018 08:59   Result Date: 04/18/2018 CLINICAL DATA:  Shortness of breath.  Abdominal pain EXAM: CT ANGIOGRAPHY CHEST CT ABDOMEN AND PELVIS WITH CONTRAST TECHNIQUE: Multidetector CT imaging of the chest was performed using the standard protocol during bolus administration of intravenous contrast. Multiplanar CT image reconstructions and MIPs were obtained to evaluate the vascular anatomy. Multidetector CT imaging of the abdomen and pelvis was performed using the standard protocol during bolus administration of intravenous contrast. CONTRAST:  179m ISOVUE-370 IOPAMIDOL (ISOVUE-370) INJECTION 76% COMPARISON:  Chest radiograph Apr 17, 2018 FINDINGS: CTA CHEST FINDINGS Cardiovascular: There is no demonstrable pulmonary embolus. There is no thoracic aortic aneurysm or dissection. There are foci of calcification in the proximal visualized great vessels. There is atherosclerotic calcification in the thoracic aorta. There are scattered foci of coronary artery calcification. There is a small pericardial effusion. Mediastinum/Nodes: There is a partially calcified mass arising from the right lobe of the thyroid measuring 2.7 x 1.9 cm. Thyroid otherwise appears unremarkable. There is extensive adenopathy throughout the chest. There are multiple supraclavicular lymph nodes, largest measuring 1.0 x 0.9 cm. There are multiple axillary lymph nodes. The largest axillary lymph node on the left measures 2.5 x 2.1 cm cm. The largest axillary lymph node on the right measures 2.3 x 2.0 cm. There is adenopathy throughout the mediastinum. There are multiple  aortopulmonary window region lymph  nodes, largest measuring 1.4 x 1.4 cm. There is extensive adenopathy in the mediastinum surrounding the trachea and carina. There is a right pretracheal lymph node measuring 1.9 x 1.8 cm. There is a lymph node to the left of the origin of the carina measuring 1.6 x 1.5 cm. There is extensive adenopathy in the right hilar region. The largest individual lymph node in the right hilum measures 2.2 x 1.4 cm. There are multiple left hilar lymph nodes. The largest left hilar lymph node measures 1.8 x 1.3 cm. There is extensive subcarinal adenopathy. Largest subcarinal lymph node measures 2.8 x 2.4 cm. There are retrocrural lymph nodes bilaterally, largest on the left measuring 1.3 x 1.0 cm. There are several small lymph nodes at the level of the right pericardiophrenic angle. No esophageal lesions are evident. Lungs/Pleura: There are free-flowing pleural effusions bilaterally, larger on the right than on the left. There is consolidation in both lung bases, likely primarily due to compressive atelectasis. There is atelectatic change with volume loss in the right middle lobe medially with focal consolidation adjacent to the right heart border in the medial segment right middle lobe. No pulmonary nodular type lesion is evident on this study. Musculoskeletal: There are no blastic or lytic bone lesions. No chest wall lesions are evident. Several benign-appearing calcifications are noted in the right breast. Review of the MIP images confirms the above findings. CT ABDOMEN and PELVIS FINDINGS Hepatobiliary: No focal liver lesions are appreciable. There is no appreciable gallbladder wall thickening. No biliary duct dilatation. Pancreas: No pancreatic mass or inflammatory focus. Extensive peripancreatic adenopathy is noted. Spleen: Spleen measures 15.0 x 12.4 x 6.8 cm with a measured splenic volume of 632 cubic cm. No focal splenic lesions are evident. Adrenals/Urinary Tract: Adrenals bilaterally  appear unremarkable. Kidneys bilaterally show no evident mass or hydronephrosis. No renal or ureteral calculus. No hydronephrosis. Urinary bladder is midline with thickening of the urinary bladder wall. There is also mild soft tissue stranding surrounding the urinary bladder. Stomach/Bowel: There is no appreciable bowel wall or mesenteric thickening. There is no evident bowel obstruction. No free air or portal venous air. Vascular/Lymphatic: There is atherosclerotic calcification throughout the aorta and iliac arteries. There is moderate narrowing of the aorta with what appears to be hemodynamically significant obstruction in both common femoral arteries. No aneurysm. Major mesenteric arterial vessels appear patent. There is extensive adenopathy throughout the abdomen. There are multiple enlarged lymph nodes in the peripancreatic region, largest measuring 1.7 x 1.5 cm. Scattered prominent mesenteric lymph nodes are noted throughout the abdomen. There is extensive retroperitoneal adenopathy. Largest retroperitoneal lymph node measures 2.5 x 1.5 cm. There is adenopathy to the right of the celiac axis measuring 2.3 x 1.5 cm. Adenopathy is seen in both external iliac node chains. The largest lymph node in this area on the right measures 2.6 x 1.7 cm. The largest lymph node in this region on the left measures 2.0 by 1.4 cm. There is also extensive adenopathy more posteriorly in the pelvis at the level of each acetabulum. The largest of these lymph nodes on the right measures 3.1 x 1.7 cm. Largest of these lymph nodes on the left measures 3.2 x 1.6 cm. There is adenopathy in both inguinal regions. Largest of these lymph nodes in the inguinal regions on the right measures 1.4 x 1.3 cm. Largest lymph node in the left inguinal region measures 1.8 x 1.6 cm. Reproductive: Uterus is apparently absent. No pelvic mass apart from adenopathy seen. Other: Appendix region  appears normal. No abscess is evident in the abdomen or  pelvis. There is mild ascites. Musculoskeletal: There are no appreciable blastic or lytic bone lesions. No intramuscular or abdominal wall lesions are evident. Review of the MIP images confirms the above findings. IMPRESSION: CT angiogram chest: 1. No demonstrable pulmonary embolus. No thoracic aortic aneurysm or dissection. Multiple foci of aortic atherosclerosis noted as well as foci of calcification in great vessels and coronary arteries. 2. Extensive adenopathy at multiple sites as summarized above. This extensive adenopathy raises concern for potential lymphoma. Small cell lung carcinoma could present in this manner as well. 3. Moderate pleural effusions bilaterally, larger on the right than on the left, with compressive atelectasis in the lung bases. There is focal consolidation felt to represent pneumonia adjacent to the right heart border in the medial segment right middle lobe. 4. Partially calcified dominant mass right lobe of thyroid. This mass may warrant nonemergent thyroid ultrasound to further evaluate. CT abdomen and pelvis: 1. Widespread abdominal and pelvic adenopathy. Splenomegaly. This combination of findings raises concern for lymphoma as most likely etiology. 2.  Mild ascites. 3. No abscess in the abdomen or pelvis. No periappendiceal region inflammation. 4. Extent extensive aortoiliac atherosclerosis with what appears to be hemodynamically significant obstruction in the common iliac arteries bilaterally. 5.  Uterus apparently absent. Aortic Atherosclerosis (ICD10-I70.0). Electronically Signed: By: Lowella Grip III M.D. On: 04/18/2018 08:41   Ct Abdomen Pelvis W Contrast  Addendum Date: 04/18/2018   ADDENDUM REPORT: 04/18/2018 08:59 ADDENDUM: Add to IMPRESSION: There is wall thickening in the urinary bladder with mild soft tissue stranding adjacent to the bladder. Suspect a degree of cystitis. Electronically Signed   By: Lowella Grip III M.D.   On: 04/18/2018 08:59   Result  Date: 04/18/2018 CLINICAL DATA:  Shortness of breath.  Abdominal pain EXAM: CT ANGIOGRAPHY CHEST CT ABDOMEN AND PELVIS WITH CONTRAST TECHNIQUE: Multidetector CT imaging of the chest was performed using the standard protocol during bolus administration of intravenous contrast. Multiplanar CT image reconstructions and MIPs were obtained to evaluate the vascular anatomy. Multidetector CT imaging of the abdomen and pelvis was performed using the standard protocol during bolus administration of intravenous contrast. CONTRAST:  171m ISOVUE-370 IOPAMIDOL (ISOVUE-370) INJECTION 76% COMPARISON:  Chest radiograph Apr 17, 2018 FINDINGS: CTA CHEST FINDINGS Cardiovascular: There is no demonstrable pulmonary embolus. There is no thoracic aortic aneurysm or dissection. There are foci of calcification in the proximal visualized great vessels. There is atherosclerotic calcification in the thoracic aorta. There are scattered foci of coronary artery calcification. There is a small pericardial effusion. Mediastinum/Nodes: There is a partially calcified mass arising from the right lobe of the thyroid measuring 2.7 x 1.9 cm. Thyroid otherwise appears unremarkable. There is extensive adenopathy throughout the chest. There are multiple supraclavicular lymph nodes, largest measuring 1.0 x 0.9 cm. There are multiple axillary lymph nodes. The largest axillary lymph node on the left measures 2.5 x 2.1 cm cm. The largest axillary lymph node on the right measures 2.3 x 2.0 cm. There is adenopathy throughout the mediastinum. There are multiple aortopulmonary window region lymph nodes, largest measuring 1.4 x 1.4 cm. There is extensive adenopathy in the mediastinum surrounding the trachea and carina. There is a right pretracheal lymph node measuring 1.9 x 1.8 cm. There is a lymph node to the left of the origin of the carina measuring 1.6 x 1.5 cm. There is extensive adenopathy in the right hilar region. The largest individual lymph node in the  right  hilum measures 2.2 x 1.4 cm. There are multiple left hilar lymph nodes. The largest left hilar lymph node measures 1.8 x 1.3 cm. There is extensive subcarinal adenopathy. Largest subcarinal lymph node measures 2.8 x 2.4 cm. There are retrocrural lymph nodes bilaterally, largest on the left measuring 1.3 x 1.0 cm. There are several small lymph nodes at the level of the right pericardiophrenic angle. No esophageal lesions are evident. Lungs/Pleura: There are free-flowing pleural effusions bilaterally, larger on the right than on the left. There is consolidation in both lung bases, likely primarily due to compressive atelectasis. There is atelectatic change with volume loss in the right middle lobe medially with focal consolidation adjacent to the right heart border in the medial segment right middle lobe. No pulmonary nodular type lesion is evident on this study. Musculoskeletal: There are no blastic or lytic bone lesions. No chest wall lesions are evident. Several benign-appearing calcifications are noted in the right breast. Review of the MIP images confirms the above findings. CT ABDOMEN and PELVIS FINDINGS Hepatobiliary: No focal liver lesions are appreciable. There is no appreciable gallbladder wall thickening. No biliary duct dilatation. Pancreas: No pancreatic mass or inflammatory focus. Extensive peripancreatic adenopathy is noted. Spleen: Spleen measures 15.0 x 12.4 x 6.8 cm with a measured splenic volume of 632 cubic cm. No focal splenic lesions are evident. Adrenals/Urinary Tract: Adrenals bilaterally appear unremarkable. Kidneys bilaterally show no evident mass or hydronephrosis. No renal or ureteral calculus. No hydronephrosis. Urinary bladder is midline with thickening of the urinary bladder wall. There is also mild soft tissue stranding surrounding the urinary bladder. Stomach/Bowel: There is no appreciable bowel wall or mesenteric thickening. There is no evident bowel obstruction. No free air or  portal venous air. Vascular/Lymphatic: There is atherosclerotic calcification throughout the aorta and iliac arteries. There is moderate narrowing of the aorta with what appears to be hemodynamically significant obstruction in both common femoral arteries. No aneurysm. Major mesenteric arterial vessels appear patent. There is extensive adenopathy throughout the abdomen. There are multiple enlarged lymph nodes in the peripancreatic region, largest measuring 1.7 x 1.5 cm. Scattered prominent mesenteric lymph nodes are noted throughout the abdomen. There is extensive retroperitoneal adenopathy. Largest retroperitoneal lymph node measures 2.5 x 1.5 cm. There is adenopathy to the right of the celiac axis measuring 2.3 x 1.5 cm. Adenopathy is seen in both external iliac node chains. The largest lymph node in this area on the right measures 2.6 x 1.7 cm. The largest lymph node in this region on the left measures 2.0 by 1.4 cm. There is also extensive adenopathy more posteriorly in the pelvis at the level of each acetabulum. The largest of these lymph nodes on the right measures 3.1 x 1.7 cm. Largest of these lymph nodes on the left measures 3.2 x 1.6 cm. There is adenopathy in both inguinal regions. Largest of these lymph nodes in the inguinal regions on the right measures 1.4 x 1.3 cm. Largest lymph node in the left inguinal region measures 1.8 x 1.6 cm. Reproductive: Uterus is apparently absent. No pelvic mass apart from adenopathy seen. Other: Appendix region appears normal. No abscess is evident in the abdomen or pelvis. There is mild ascites. Musculoskeletal: There are no appreciable blastic or lytic bone lesions. No intramuscular or abdominal wall lesions are evident. Review of the MIP images confirms the above findings. IMPRESSION: CT angiogram chest: 1. No demonstrable pulmonary embolus. No thoracic aortic aneurysm or dissection. Multiple foci of aortic atherosclerosis noted as well as foci  of calcification in  great vessels and coronary arteries. 2. Extensive adenopathy at multiple sites as summarized above. This extensive adenopathy raises concern for potential lymphoma. Small cell lung carcinoma could present in this manner as well. 3. Moderate pleural effusions bilaterally, larger on the right than on the left, with compressive atelectasis in the lung bases. There is focal consolidation felt to represent pneumonia adjacent to the right heart border in the medial segment right middle lobe. 4. Partially calcified dominant mass right lobe of thyroid. This mass may warrant nonemergent thyroid ultrasound to further evaluate. CT abdomen and pelvis: 1. Widespread abdominal and pelvic adenopathy. Splenomegaly. This combination of findings raises concern for lymphoma as most likely etiology. 2.  Mild ascites. 3. No abscess in the abdomen or pelvis. No periappendiceal region inflammation. 4. Extent extensive aortoiliac atherosclerosis with what appears to be hemodynamically significant obstruction in the common iliac arteries bilaterally. 5.  Uterus apparently absent. Aortic Atherosclerosis (ICD10-I70.0). Electronically Signed: By: Lowella Grip III M.D. On: 04/18/2018 08:41   Dg Chest Port 1 View  Result Date: 05/14/2018 CLINICAL DATA:  Pleural effusion EXAM: PORTABLE CHEST 1 VIEW COMPARISON:  05/13/2018 FINDINGS: Cardiac shadow is stable. Endotracheal tube and nasogastric catheter have been removed in the interval. Right chest wall port is again seen. Thyroid calcifications are noted. Bilateral pleural effusions and basilar infiltrate is seen right greater than left relatively stable from the prior exam. No acute bony abnormality is noted. IMPRESSION: Bibasilar changes right greater than left relatively stable from the prior study. Electronically Signed   By: Inez Catalina M.D.   On: 05/14/2018 08:12   Dg Chest Port 1 View  Result Date: 05/13/2018 CLINICAL DATA:  Follow-up pneumonia EXAM: PORTABLE CHEST 1 VIEW  COMPARISON:  05/12/2018 FINDINGS: Endotracheal tube and nasogastric catheter and right-sided chest wall port are again seen and stable. Cardiac shadow is stable. Aortic calcifications are again noted. Right thyroid calcification is noted as well. Persistent bibasilar infiltrates are noted with associated effusions right greater than left. No bony abnormality is noted. IMPRESSION: Persistent bibasilar changes not significantly improved when compared with the prior exam. Electronically Signed   By: Inez Catalina M.D.   On: 05/13/2018 07:51   Dg Chest Port 1 View  Result Date: 05/12/2018 CLINICAL DATA:  Acute respiratory failure EXAM: PORTABLE CHEST 1 VIEW COMPARISON:  May 11, 2018 FINDINGS: The ETT terminates 16 mm above the carina. Recommend withdrawing 1 cm. The right Port-A-Cath is stable. The NG tube terminates below today's film. Layering effusion with underlying opacity on the left is similar in the interval. Focal infiltrate in the right lower lobe is stable. There may also be pulmonary edema. No other changes. IMPRESSION: 1. The ETT terminates 1.6 cm above the carina. Recommend withdrawing 1 cm. Other support apparatus as above. 2. Effusion and underlying opacity on the left is stable to mildly worsened. Focal infiltrate on the right is stable. Probable superimposed edema. Electronically Signed   By: Dorise Bullion III M.D   On: 05/12/2018 07:32   Ct Bone Marrow Biopsy & Aspiration  Result Date: 05/05/2018 INDICATION: Lymphoma EXAM: CT BONE MARROW BIOPSY AND ASPIRATION MEDICATIONS: None. ANESTHESIA/SEDATION: Fentanyl 75 mcg IV; Versed 2 mg IV Moderate Sedation Time:  32 minutes The patient was continuously monitored during the procedure by the interventional radiology nurse under my direct supervision. FLUOROSCOPY TIME:  Fluoroscopy Time:  minutes  seconds ( mGy). COMPLICATIONS: None immediate. PROCEDURE: Informed written consent was obtained from the patient after a thorough discussion of  the  procedural risks, benefits and alternatives. All questions were addressed. Maximal Sterile Barrier Technique was utilized including caps, mask, sterile gowns, sterile gloves, sterile drape, hand hygiene and skin antiseptic. A timeout was performed prior to the initiation of the procedure. Under CT guidance, a(n) 11 gauge guide needle was advanced into the right iliac bone. Aspirates and a core were obtained. Post biopsy images demonstrate . Patient tolerated the procedure well without complication. Vital sign monitoring by nursing staff during the procedure will continue as patient is in the special procedures unit for post procedure observation. FINDINGS: The images document guide needle placement within the right iliac bone. Post biopsy images demonstrate no hemorrhage. IMPRESSION: Successful CT-guided right iliac bone marrow aspirate and core. Electronically Signed   By: Marybelle Killings M.D.   On: 05/05/2018 13:33   Ir Imaging Guided Port Insertion  Result Date: 05/07/2018 INDICATION: 62 year old female with a history of lymphoma. She has been referred for port catheter. EXAM: IMPLANTED PORT A CATH PLACEMENT WITH ULTRASOUND AND FLUOROSCOPIC GUIDANCE MEDICATIONS: 2 g Ancef; The antibiotic was administered within an appropriate time interval prior to skin puncture. ANESTHESIA/SEDATION: Moderate (conscious) sedation was employed during this procedure. A total of Versed mg and Fentanyl 100 mcg was administered intravenously. Moderate Sedation Time: 20 minutes. The patient's level of consciousness and vital signs were monitored continuously by radiology nursing throughout the procedure under my direct supervision. FLUOROSCOPY TIME:  0 minutes, 6 seconds (1.5 mGy) COMPLICATIONS: None PROCEDURE: The procedure, risks, benefits, and alternatives were explained to the patient. Questions regarding the procedure were encouraged and answered. The patient understands and consents to the procedure. Ultrasound survey was  performed with images stored and sent to PACs. The right neck and chest was prepped with chlorhexidine, and draped in the usual sterile fashion using maximum barrier technique (cap and mask, sterile gown, sterile gloves, large sterile sheet, hand hygiene and cutaneous antiseptic). Antibiotic prophylaxis was provided with 2.0g Ancef administered IV one hour prior to skin incision. Local anesthesia was attained by infiltration with 1% lidocaine without epinephrine. Ultrasound demonstrated patency of the right internal jugular vein, and this was documented with an image. Under real-time ultrasound guidance, this vein was accessed with a 21 gauge micropuncture needle and image documentation was performed. A small dermatotomy was made at the access site with an 11 scalpel. A 0.018" wire was advanced into the SVC and used to estimate the length of the internal catheter. The access needle exchanged for a 68F micropuncture vascular sheath. The 0.018" wire was then removed and a 0.035" wire advanced into the IVC. An appropriate location for the subcutaneous reservoir was selected below the clavicle and an incision was made through the skin and underlying soft tissues. The subcutaneous tissues were then dissected using a combination of blunt and sharp surgical technique and a pocket was formed. A single lumen power injectable portacatheter was then tunneled through the subcutaneous tissues from the pocket to the dermatotomy and the port reservoir placed within the subcutaneous pocket. The venous access site was then serially dilated and a peel away vascular sheath placed over the wire. The wire was removed and the port catheter advanced into position under fluoroscopic guidance. The catheter tip is positioned in the cavoatrial junction. This was documented with a spot image. The portacatheter was then tested and found to flush and aspirate well. The port was flushed with saline and Huber needle was left in place at the  completion. The pocket was then closed in two layers  using first subdermal inverted interrupted absorbable sutures followed by a running subcuticular suture. The epidermis was then sealed with Dermabond. The dermatotomy at the venous access site was also seal with Dermabond and Steri-Strips. Patient tolerated the procedure well and remained hemodynamically stable throughout. No complications encountered and no significant blood loss encountered . IMPRESSION: Status post right IJ port catheter placement. Catheter ready for use. Signed, Dulcy Fanny. Dellia Nims, RPVI [facility] Electronically Signed   By: Corrie Mckusick D.O.   On: 05/07/2018 17:19   US Thoracentesis Asp Pleural Space W/img Guide  Result Date: 05/11/2018 INDICATION: Symptomatic right sided pleural effusion. Please perform ultrasound-guided thoracentesis for therapeutic purposes. EXAM: US THORACENTESIS ASP PLEURAL SPACE W/IMG GUIDE COMPARISON:  Chest CT-04/18/2018; chest radiograph-05/09/2018 MEDICATIONS: None. COMPLICATIONS: None immediate. TECHNIQUE: Informed written consent was obtained from the patient after a discussion of the risks, benefits and alternatives to treatment. A timeout was performed prior to the initiation of the procedure. Initial ultrasound scanning demonstrates a large anechoic right-sided pleural effusion. The lower chest was prepped and draped in the usual sterile fashion. 1% lidocaine was used for local anesthesia. An ultrasound image was saved for documentation purposes. An 8 Fr Safe-T-Centesis catheter was introduced. The thoracentesis was performed. The catheter was removed and a dressing was applied. The patient tolerated the procedure well without immediate post procedural complication. The patient was escorted to have an upright chest radiograph. FINDINGS: A total of approximately 1.7 liters of serous fluid was removed. IMPRESSION: Successful ultrasound-guided right  sided thoracentesis yielding 1.7 liters of pleural fluid. Electronically Signed   By: Sandi Mariscal M.D.   On: 05/11/2018 10:45    ASSESSMENT & PLAN:  62 y.o. 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 S/P c1 OF CHOP on 05/08/2018  2. General LNadenopathy from lymphoma with b/l pleural effusion and b/l lower extremity weakness  3. Anemia/thrombocytopenia -  from Bone marrow involvement by lymphoma BM Bx confirms involved by T cell lymphoma. Hgb down to 7.5 and platelets down to 25k.  4. 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 LDH wnl no evidnece of overt hemolysis at this timne   5. CMV IgM+ --  Component     Latest Ref Rng & Units 05/09/2018  CMV Quant DNA PCR (Plasma)     Negative IU/mL 8,430  Log10 CMV Qn DNA Pl     log10 IU/mL 3.926    6. High risk for TLS - on allopurinol , received Rasburicase. Uric acid normalized Uric acid 16--->8--> 4.6-->3.4  7. Port a cath in situ  8. 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 Acute hypoxic respiratory failure - required intubation --now extubated and with minimal oxygen needs.  ?etiology Re-expansion pulmonary edema post large volume thoracentesis vs aspiration Though the rapidity of presentation is unusual would need to consider HCAP and CMV pneumonitis (CMV IgM+ and PCV positive with worsening). -elevated procalcitonin -improving.  10 . RUE DVT - US 6/25 -- was briefly on IV Heparin but held given thrombocytopenia PLAN: -appreciate hospitalist/CCM input -on unasyn for concern of aspiration pneumonia -advancing diet --Discussed pt labwork today, 05/15/18; WBC at 700 ([facility] 400), HGB at 7.5, plt 25k -diuretics per fluid status -rpt CMV PCR quantitative to evaluate for increasing titers -ordered-pending results -continue granix 450mg [place] daily till [place]>1000 on 2  occasions -transfuse PRBC prn for hgb <  8 -transfuse platelets for bleeding or prophylactically if <20k (in setting of infection/sepsis) -will continue to follow  . The total time spent in the appointment was 25 minutes and more than 50% was on counseling and direct patient cares.    Sullivan Lone MD MS AAHIVMS [facility] Hematology/Oncology [facility]  (Office):       [phone] (Work cell):  [phone] (Fax):           [phone]  05/15/2018 4:42 PM   I, Baldwin [place], am acting as a Education administrator for Dr Irene Limbo.   .I have reviewed the above documentation for accuracy and completeness, and I agree with the above. Sullivan Lone MD MS .

## 2018-05-15 NOTE — Progress Notes (Signed)
Nutrition Follow-up  DOCUMENTATION CODES:   Not applicable  INTERVENTION:  - Continue to encourage PO intakes. - Encourage family to bring in favorite foods. - RD will continue to monitor for additional nutrition-related needs.   NUTRITION DIAGNOSIS:   Increased nutrient needs related to catabolic illness, cancer and cancer related treatments as evidenced by estimated needs. -ongoing  GOAL:   Patient will meet greater than or equal to 90% of their needs -likely unmet at this time.   MONITOR:   PO intake, Weight trends, Labs, Skin  ASSESSMENT:   62 yo Female with PMH of HTN, endometriosis s/p hysterectomy, tobacco use, who recently admitted with multifocal lymphadenopathy and concerns for lymphoma, she presents with shortness of breath and difficulty sleeping. Symptoms become worse at night. She continues to exhibit lymphadenopathy concerning for lymphoma upon this admission, also has a calcified mass in R lobe of thyroid.  Weight fluctuating throughout admission (22 days) and current weight is the lowest weight has been throughout that time. Diet advanced from NPO to CLD on 6/25 with 75% of lunch consumed that day. Diet further advanced from CLD to Regular on 6/26 at 8:30 AM with 50% of breakfast and 75% of lunch consumed that day. SLP previously following; has cleared for Regular diet and no SLP follow-up planned.   Patient in bed with no family/visitors present. She denies abdominal pain or nausea over the past few days and no throat pain/discomfort since the removal of ETT on 6/25 AM. She states that appetite is improving but she does no care for the hospital food. She states that her family visits and is able to bring in favorite items if she would like. She denies nutrition-related questions or concerns at this time.  Per Dr. Reggy Eye note this AM: stage 3-4 angioimmunoblastic lymphoma s/p CHOP to continue allopurinol, pancytopenia thought tbe 2/2 chemo, hypokalemia and  hypomagnesemia with repletion ordered, plural effusions s/p R thoracentesis x2, R subclavian DVT, aspiration PNA with septic shock resolved, improving anasarca.  Medications reviewed; 4 g IV Mg sulfate x1 run today, daily multivitamin with minerals, 40 mEq oral KCl x2 doses today. Labs reviewed; CBG: 125 mg/dL this AM, K: 3.1 mmol/L, creatinine: 0.35 mg/dL, Ca: 7.7 mg/dL, Mg: 1.6 mg/dL.      Diet Order:   Diet Order           Diet regular Room service appropriate? Yes; Fluid consistency: Thin  Diet effective now          EDUCATION NEEDS:   No education needs have been identified at this time  Skin:  Skin Assessment: Skin Integrity Issues: Skin Integrity Issues:: Incisions Incisions: R buttocks (05/05/18)  Last BM:  6/26  Height:   Ht Readings from Last 1 Encounters:  05/09/18 5' 1.81" (1.57 m)    Weight:   Wt Readings from Last 1 Encounters:  05/15/18 136 lb 7.4 oz (61.9 kg)    Ideal Body Weight:  50 kg  BMI:  Body mass index is 25.11 kg/m.  Estimated Nutritional Needs:   Kcal:  2030-2225 (32-35 kcal/kg)  Protein:  95-108 grams (1.5-1.7 grams/kg)  Fluid:  >/= 1.6 L/day      Jarome Matin, MS, RD, LDN, [facility] Inpatient Clinical Dietitian Pager # [phone] After hours/weekend pager # [phone]

## 2018-05-15 NOTE — Progress Notes (Addendum)
PROGRESS NOTE    Yanely Mast   NGE:[id number]  DOB: 1956/04/21  DOA: 04/23/2018 PCP: Patient, No Pcp Per   Brief Narrative:  Adelynn Gipe is a 62 y/o with  Nicotine abuse, b/l pleural effusions and diffuse lymphadenopathy who was last admitted on 5/30 and underwent a thoracentesis of right pleural effusion on 5/31 which showed nonspecific atypical lymphocytosis and then a left axillary excisional LN biopsy on 6/3 and then discharged.   She presented back 2 days later with dyspnea, leg swelling and chest and stomach pain. She was also noted to have a RML opacity and was treated for CAP with Augmentin and Azithromycin for 5 days. Hospital course was complicated by hyponatremia and anasarca.  LN Biopsy results revealed Angioimmunoblastic t-cell lymphoma and she was transferred to [facility] on 6/18 for port a cath placement and initiation of chemotherapy. 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 Noted to have leakage of serous fluid from biopsy puncture site though to be related to anasarca 6/19 port placed 6/20 received first chemo- [facility] and received 2 [facility]  6/23 underwent repeat right thoracentesis for therapeutic purposes- 1.7 L removed 6/23 developed VDRF and septic shoch from aspiration to RLL- started on Unasyn 6/25 extubated and found to have right subclavian DVT- started on Heparin infusion    Subjective: No complaints of cough, congestion, nausea, vomiting diarrhea or loss of appetite.    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 - WBC 0.7- give a dose of Granix - Plt 25- Heparin stopped- on bleeding or bruising noted    Hypomag/ hypokalemia - replacing    Pleural effusions - s/p  right thoracentesis x 2- follow     Right subclavian DVT - have had to stop Heparin infusion due to acute thrombocytopenia  Aspiration pneumonia with septic shock and VDRF - weaned off pressors and extubated on 6/25 - Augmentin stop date 6/28  Steroid induced hyperglycemia - due to Decadron which has been discontinued- sugars have since improved- d/c Insluin  Hyponatremia/ anasarca - improving- has pedal edema still- place TEDS   DVT prophylaxis: SCDs Code Status: full code Family Communication:  Disposition Plan: cont to follow in SDU Consultants:   Oncology  IR  PCCM Procedures:   See above Antimicrobials:  Anti-infectives (From admission, onward)   Start     Dose/Rate Route Frequency Ordered Stop   05/14/18 1000  amoxicillin-clavulanate (AUGMENTIN) 875-125 MG per tablet 1 tablet     1 tablet Oral Every 12 hours 05/14/18 0803 05/17/18 0959   05/11/18 1300  Ampicillin-Sulbactam (UNASYN) 3 g in sodium chloride 0.9 % 100 mL IVPB  Status:  Discontinued     3 g 200 mL/hr over 30 Minutes Intravenous Every 6 hours 05/11/18 1219 05/14/18 0803   05/07/18 1530  ceFAZolin (ANCEF) IVPB 2g/100 mL premix     2 g 200 mL/hr over 30 Minutes Intravenous  Once 05/07/18 1524 05/07/18 1641   04/25/18 1130  amoxicillin-clavulanate (AUGMENTIN) 875-125 MG per tablet 1 tablet     1 tablet Oral Every 12 hours 04/25/18 1124 04/28/18 2143   04/25/18 1000  azithromycin (ZITHROMAX) tablet 250 mg     250 mg Oral Daily 04/24/18 1100  04/28/18 0925   04/24/18 1200  amoxicillin-clavulanate (AUGMENTIN) 500-125 MG per tablet 500 mg  Status:  Discontinued     1 tablet Oral 2 times daily 04/24/18 1100 04/25/18 1124   04/24/18 1115  azithromycin (ZITHROMAX) tablet 500 mg     500 mg Oral Daily 04/24/18 1100 04/24/18 1246       Objective: Vitals:   05/15/18 0000 05/15/18 0337 05/15/18 0400 05/15/18 0600  BP: (!) 132/41  (!) 118/30   Pulse:      Resp: (!) 25  (!) 24   Temp:  98.6 F (37 C)      TempSrc:  Oral    SpO2: 98%  98%   Weight:    61.9 kg (136 lb 7.4 oz)  Height:        Intake/Output Summary (Last 24 hours) at 05/15/2018 0738 Last data filed at 05/15/2018 0400 Gross per 24 hour  Intake 700 ml  Output -  Net 700 ml   Filed Weights   05/13/18 0600 05/14/18 0600 05/15/18 0600  Weight: 63.5 kg (139 lb 15.9 oz) 63.4 kg (139 lb 12.4 oz) 61.9 kg (136 lb 7.4 oz)    Examination: General exam: Appears comfortable  HEENT: PERRLA, oral mucosa moist, no sclera icterus or thrush Respiratory system: Clear to auscultation. Respiratory effort normal. Cardiovascular system: S1 & S2 heard, RRR.   Gastrointestinal system: Abdomen soft, non-tender, nondistended. Normal bowel sound. No organomegaly Central nervous system: Alert and oriented. No focal neurological deficits. Extremities: No cyanosis, clubbing or edema Skin: No rashes or ulcers Psychiatry:  Mood & affect appropriate.     Data Reviewed: I have personally reviewed following labs and imaging studies  CBC: Recent Labs  Lab 05/09/18 0650 05/10/18 0952  05/12/18 0500 05/13/18 0526 05/13/18 2232 05/14/18 0324 05/15/18 0321  WBC 4.2 3.0*   < > 16.3* 12.5* 6.1 4.8 1.0*  NEUTROABS 3.0 2.3  --   --   --   --  4.4  --   HGB 9.6* 8.9*   < > 9.8* 8.2* 8.3* 8.1* 7.1*  HCT 29.1* 27.3*   < > 29.8* 25.8* 25.2* 25.2* 21.5*  MCV 90.1 91.0   < > 88.4 89.0 89.4 88.7 86.3  PLT 112* 106*   < > 84* 59* 42* 39* 29*   < > = values in this interval not displayed.   Basic Metabolic Panel: Recent Labs  Lab 05/10/18 0952 05/11/18 0500 05/12/18 0500 05/12/18 1013 05/12/18 1617 05/13/18 0526 05/13/18 1545 05/14/18 0324 05/15/18 0321  NA 138 139 141  --   --  143  --  141 138  K 3.9 3.9 3.6  --   --  3.3*  --  3.3* 3.1*  CL 107 106 109  --   --  109  --  106 104  CO2 _0 --   --  31  --  31 32  GLUCOSE 234* 123* 115*  --   --  150*  --  104* 114*  BUN 24* 22* 26*  --   --  23  --  18 14  CREATININE 0.56 0.53 0.50   --   --  0.46  --  0.38* 0.35*  CALCIUM 8.1* 8.5* 7.8*  --   --  7.9*  --  7.9* 7.7*  MG 1.9  --   --  1.7 1.7 1.8 1.6*  --  1.6*  PHOS 3.1  --   --  2.6 3.0 2.5 2.3*  --   --  GFR: Estimated Creatinine Clearance: 62.8 mL/min (A) (by C-G formula based on SCr of 0.35 mg/dL (L)). Liver Function Tests: Recent Labs  Lab 05/09/18 0650 05/10/18 0952 05/12/18 0500 05/13/18 0526 05/14/18 0324  AST _0 ALT _1 ALKPHOS 46 48 42 52 60  BILITOT 0.4 0.4 0.6 0.5 0.7  PROT 7.8 7.1 5.4* 5.4* 5.6*  ALBUMIN 2.7* 2.6* 1.9* 2.1* 2.2*   No results for input(s): LIPASE, AMYLASE in the last 168 hours. No results for input(s): AMMONIA in the last 168 hours. Coagulation Profile: Recent Labs  Lab 05/12/18 0500  INR 1.13   Cardiac Enzymes: No results for input(s): CKTOTAL, CKMB, CKMBINDEX, TROPONINI in the last 168 hours. BNP (last 3 results) No results for input(s): PROBNP in the last 8760 hours. HbA1C: No results for input(s): HGBA1C in the last 72 hours. CBG: Recent Labs  Lab 05/13/18 2128 05/14/18 0736 05/14/18 1120 05/14/18 1728 05/14/18 2106  GLUCAP 122* 126* 122* 117* 140*   Lipid Profile: No results for input(s): CHOL, HDL, LDLCALC, TRIG, CHOLHDL, LDLDIRECT in the last 72 hours. Thyroid Function Tests: No results for input(s): TSH, T4TOTAL, FREET4, T3FREE, THYROIDAB in the last 72 hours. Anemia Panel: No results for input(s): VITAMINB12, FOLATE, FERRITIN, TIBC, IRON, RETICCTPCT in the last 72 hours. Urine analysis:    Component Value Date/Time   COLORURINE AMBER (A) 04/18/2018 0552   APPEARANCEUR HAZY (A) 04/18/2018 0552   LABSPEC 1.020 04/18/2018 0552   PHURINE 5.0 04/18/2018 0552   GLUCOSEU NEGATIVE 04/18/2018 0552   HGBUR NEGATIVE 04/18/2018 0552   BILIRUBINUR NEGATIVE 04/18/2018 0552   KETONESUR 5 (A) 04/18/2018 0552   PROTEINUR 30 (A) 04/18/2018 0552   NITRITE NEGATIVE 04/18/2018 0552   LEUKOCYTESUR NEGATIVE 04/18/2018 0552   Sepsis  Labs: _2 (procalcitonin:4,lacticidven:4) ) Recent Results (from the past 240 hour(s))  MRSA PCR Screening     Status: None   Collection Time: 05/11/18  1:45 PM  Result Value Ref Range Status   MRSA by PCR NEGATIVE NEGATIVE Final    Comment:        The GeneXpert MRSA Assay (FDA approved for NASAL specimens only), is one component of a comprehensive MRSA colonization surveillance program. It is not intended to diagnose MRSA infection nor to guide or monitor treatment for MRSA infections. Performed at [facility], [place] [address], [place], [place] [address]   Culture, blood (Routine X 2) w Reflex to ID Panel     Status: None (Preliminary result)   Collection Time: 05/11/18  2:38 PM  Result Value Ref Range Status   Specimen Description   Final    BLOOD RIGHT ANTECUBITAL Performed at [place] [address], [place], [place] [address]    Special Requests   Final    BOTTLES DRAWN AEROBIC ONLY Blood Culture adequate volume Performed at [place] [address], [place], [place] [address]    Culture   Final    NO GROWTH 3 DAYS Performed at [facility], [place] [address], [place], [place] [address]    Report Status PENDING  Incomplete  Culture, blood (Routine X 2) w Reflex to ID Panel     Status: None (Preliminary result)   Collection Time: 05/11/18  2:38 PM  Result Value Ref Range Status   Specimen Description   Final    BLOOD BLOOD LEFT HAND Performed at [place] [address] [place],  [address]  Special Requests   Final    BOTTLES DRAWN AEROBIC ONLY Blood Culture results may not be optimal due to an inadequate volume of blood received in culture bottles Performed at [place] [address] [place], [place] [address]    Culture   Final    NO GROWTH 3 DAYS Performed at [facility], [place] [address], [place], [place] [address]     Report Status PENDING  Incomplete  Culture, respiratory (NON-Expectorated)     Status: None   Collection Time: 05/11/18  3:11 PM  Result Value Ref Range Status   Specimen Description   Final    TRACHEAL ASPIRATE Performed at [place] [address] [place], [place] [address]    Special Requests   Final    NONE Performed at [facility], [place] [address] [place], [place] [address]    Gram Stain NO WBC SEEN NO ORGANISMS SEEN   Final   Culture   Final    Consistent with normal respiratory flora. Performed at [facility], [place] [address], [place], [place] [address]    Report Status 05/14/2018 FINAL  Final         Radiology Studies: Dg Chest Port 1 View  Result Date: 05/14/2018 CLINICAL DATA:  Pleural effusion EXAM: PORTABLE CHEST 1 VIEW COMPARISON:  05/13/2018 FINDINGS: Cardiac shadow is stable. Endotracheal tube and nasogastric catheter have been removed in the interval. Right chest wall port is again seen. Thyroid calcifications are noted. Bilateral pleural effusions and basilar infiltrate is seen right greater than left relatively stable from the prior exam. No acute bony abnormality is noted. IMPRESSION: Bibasilar changes right greater than left relatively stable from the prior study. Electronically Signed   By: Inez Catalina M.D.   On: 05/14/2018 08:12      Scheduled Meds: . allopurinol  300 mg Oral Daily  . amoxicillin-clavulanate  1 tablet Oral Q12H  . Chlorhexidine Gluconate Cloth  6 each Topical Daily  . insulin aspart  0-20 Units Subcutaneous TID AC & HS  . mouth rinse  15 mL Mouth Rinse BID  . multivitamin with minerals  1 tablet Oral Daily  . sodium chloride flush  10-40 mL Intracatheter Q12H  . sodium chloride flush  3 mL Intravenous Q12H   Continuous Infusions: . sodium chloride       LOS: 20 days    Time spent in minutes: 35    Debbe Odea, MD [facility] Pager: [email or web address] Password  [facility] 05/15/2018, 7:38 AM

## 2018-05-15 NOTE — Progress Notes (Addendum)
Physical Therapy Treatment Patient Details Name: Deborah Cooley MRN: [id number] DOB: 09/18/1956 Today's Date: 05/15/2018    History of Present Illness Pt is a 62 y.o. F with significant PMH of HTN, endometriosis s/p hysterectomy, tobaccos use and recent hospitalization for multifocal lymphadenopathy and concern for lymphoma who presents for evaluation of recurrent shortness of breath and abdominal pain. Transfer to ICU with VDRF 05/11/18. Extubated 05/13/18.     PT Comments    Pt now off ventilator and tolerating activity well. She ambulated 24' with RW, no loss of balance, SaO2 93% walking, HR 80s.   Follow Up Recommendations  Home health PT;Supervision for mobility/OOB     Equipment Recommendations  None recommended by PT    Recommendations for Other Services       Precautions / Restrictions Precautions Precautions: Fall Restrictions Weight Bearing Restrictions: No    Mobility  Bed Mobility Overal bed mobility: Modified Independent Bed Mobility: Supine to Sit     Supine to sit: Modified independent (Device/Increase time);HOB elevated     General bed mobility comments: pt now off vent, HOB up 60*  Transfers Overall transfer level: Needs assistance Equipment used: Rolling walker (2 wheeled) Transfers: Sit to/from Stand Sit to Stand: Min guard         General transfer comment: VCs for hand placement  Ambulation/Gait Ambulation/Gait assistance: Min guard Gait Distance (Feet): 70 Feet Assistive device: Rolling walker (2 wheeled) Gait Pattern/deviations: Step-through pattern Gait velocity: WFL   General Gait Details: steady with RW, SaO2 93% on room air, HR 80s   Stairs             Wheelchair Mobility    Modified Rankin (Stroke Patients Only)       Balance Overall balance assessment: Needs assistance   Sitting balance-Leahy Scale: Good       Standing balance-Leahy Scale: Fair                              Cognition  Arousal/Alertness: Awake/alert Behavior During Therapy: WFL for tasks assessed/performed Overall Cognitive Status: Within Functional Limits for tasks assessed                                        Exercises General Exercises - Lower Extremity Ankle Circles/Pumps: AROM;Both;10 reps;Seated    General Comments        Pertinent Vitals/Pain Pain Assessment: No/denies pain    Home Living                      Prior Function            PT Goals (current goals can now be found in the care plan section) Acute Rehab PT Goals Patient Stated Goal: play with grandkids PT Goal Formulation: With patient Time For Goal Achievement: 05/29/18 Potential to Achieve Goals: Good Progress towards PT goals: Progressing toward goals    Frequency    Min 3X/week      PT Plan Current plan remains appropriate    Co-evaluation              AM-PAC PT "6 Clicks" Daily Activity  Outcome Measure  Difficulty turning over in bed (including adjusting bedclothes, sheets and blankets)?: None Difficulty moving from lying on back to sitting on the side of the bed? : A Little Difficulty sitting down on and standing  up from a chair with arms (e.g., wheelchair, bedside commode, etc,.)?: A Little Help needed moving to and from a bed to chair (including a wheelchair)?: A Little Help needed walking in hospital room?: A Little Help needed climbing 3-5 steps with a railing? : A Lot 6 Click Score: 18    End of Session Equipment Utilized During Treatment: Gait belt Activity Tolerance: Patient tolerated treatment well Patient left: in chair;with call bell/phone within reach Nurse Communication: Mobility status PT Visit Diagnosis: Unsteadiness on feet (R26.81);Difficulty in walking, not elsewhere classified (R26.2)     Time: 1783-7542 PT Time Calculation (min) (ACUTE ONLY): 20 min  Charges:  $Gait Training: 8-22 mins                    G Codes:          Deborah Cooley 05/15/2018, 2:49 PM [phone]

## 2018-05-16 DIAGNOSIS — D6481 Anemia due to antineoplastic chemotherapy: Secondary | ICD-10-CM

## 2018-05-16 DIAGNOSIS — T451X5A Adverse effect of antineoplastic and immunosuppressive drugs, initial encounter: Secondary | ICD-10-CM

## 2018-05-16 DIAGNOSIS — D701 Agranulocytosis secondary to cancer chemotherapy: Secondary | ICD-10-CM

## 2018-05-16 DIAGNOSIS — R601 Generalized edema: Secondary | ICD-10-CM

## 2018-05-16 LAB — CBC WITH DIFFERENTIAL/PLATELET
Basophils Absolute: 0 10*3/uL (ref 0.0–0.1)
Basophils Relative: 2 %
Eosinophils Absolute: 0 10*3/uL (ref 0.0–0.7)
Eosinophils Relative: 3 %
HEMATOCRIT: 26.1 % — AB (ref 36.0–46.0)
HEMOGLOBIN: 8.8 g/dL — AB (ref 12.0–15.0)
LYMPHS ABS: 0.2 10*3/uL (ref 0.7–4.0)
Lymphocytes Relative: 29 %
MCH: 28.5 pg (ref 26.0–34.0)
MCHC: 33.7 g/dL (ref 30.0–36.0)
MCV: 84.5 fL (ref 78.0–100.0)
MONOS PCT: 2 %
Monocytes Absolute: 0 10*3/uL (ref 0.1–1.0)
NEUTROS ABS: 0.4 10*3/uL (ref 1.7–7.7)
NEUTROS PCT: 64 %
Platelets: 16 10*3/uL — CL (ref 150–400)
RBC: 3.09 MIL/uL — ABNORMAL LOW (ref 3.87–5.11)
RDW: 15.7 % — AB (ref 11.5–15.5)
WBC: 0.6 10*3/uL — AB (ref 4.0–10.5)

## 2018-05-16 LAB — TYPE AND SCREEN
ABO/RH(D): O POS
ANTIBODY SCREEN: NEGATIVE
Unit division: 0

## 2018-05-16 LAB — MAGNESIUM: Magnesium: 1.9 mg/dL (ref 1.7–2.4)

## 2018-05-16 LAB — BPAM RBC
Blood Product Expiration Date: 201907212359
ISSUE DATE / TIME: 201906271310
Unit Type and Rh: 5100

## 2018-05-16 LAB — BASIC METABOLIC PANEL
Anion gap: 4 — ABNORMAL LOW (ref 5–15)
BUN: 12 mg/dL (ref 8–23)
CALCIUM: 7.5 mg/dL — AB (ref 8.9–10.3)
CHLORIDE: 104 mmol/L (ref 98–111)
CO2: 28 mmol/L (ref 22–32)
CREATININE: 0.38 mg/dL — AB (ref 0.44–1.00)
GFR calc Af Amer: >60 mL/min (ref >60)
GFR calc non Af Amer: >60 mL/min (ref >60)
GLUCOSE: 148 mg/dL — AB (ref 70–99)
Potassium: 4 mmol/L (ref 3.5–5.1)
Sodium: 136 mmol/L (ref 135–145)

## 2018-05-16 LAB — CULTURE, BLOOD (ROUTINE X 2)
CULTURE: NO GROWTH
Culture: NO GROWTH
Special Requests: ADEQUATE

## 2018-05-16 MED ORDER — HEPARIN SOD (PORK) LOCK FLUSH 100 UNIT/ML IV SOLN
250.0000 [IU] | INTRAVENOUS | Status: DC | PRN
  Filled 2018-05-16: qty 2.5

## 2018-05-16 MED ORDER — HEPARIN SOD (PORK) LOCK FLUSH 100 UNIT/ML IV SOLN
500.0000 [IU] | Freq: Every day | INTRAVENOUS | Status: DC | PRN
  Filled 2018-05-16 (×2): qty 5

## 2018-05-16 MED ORDER — SODIUM CHLORIDE 0.9 % IV SOLN: 250.0000 mL | Freq: Once | INTRAVENOUS | Status: DC

## 2018-05-16 MED ORDER — SODIUM CHLORIDE 0.9% FLUSH: 3.0000 mL | INTRAVENOUS | Status: DC | PRN

## 2018-05-16 MED ORDER — FUROSEMIDE 10 MG/ML IJ SOLN
20.0000 mg | Freq: Once | INTRAMUSCULAR | Status: AC
  Administered 2018-05-16: 20 mg via INTRAVENOUS
  Filled 2018-05-16: qty 2

## 2018-05-16 MED ORDER — ACETAMINOPHEN 325 MG PO TABS
650.0000 mg | ORAL_TABLET | Freq: Once | ORAL | Status: AC
  Administered 2018-05-16: 650 mg via ORAL
  Filled 2018-05-16: qty 2

## 2018-05-16 MED ORDER — SODIUM CHLORIDE 0.9% FLUSH: 10.0000 mL | INTRAVENOUS | Status: DC | PRN

## 2018-05-16 MED ORDER — TRAZODONE HCL 50 MG PO TABS
50.0000 mg | ORAL_TABLET | Freq: Once | ORAL | Status: AC
  Administered 2018-05-16: 50 mg via ORAL
  Filled 2018-05-16: qty 1

## 2018-05-16 NOTE — Progress Notes (Signed)
PROGRESS NOTE    Amina Menchaca   PIR:[id number]  DOB: Apr 09, 1956  DOA: 04/23/2018 PCP: Patient, No Pcp Per   Brief Narrative:  Chayla Shands is a 62 y/o with  Nicotine abuse, b/l pleural effusions and diffuse lymphadenopathy who was last admitted on 5/30 and underwent a thoracentesis of right pleural effusion on 5/31 which showed nonspecific atypical lymphocytosis and then a left axillary excisional LN biopsy on 6/3 and then discharged.   She presented back 2 days later with dyspnea, leg swelling and chest and stomach pain. She was also noted to have a RML opacity and was treated for CAP with Augmentin and Azithromycin for 5 days. Hospital course was complicated by hyponatremia and anasarca.  LN Biopsy results revealed Angioimmunoblastic t-cell lymphoma and she was transferred to [facility] long on 6/18 for port a cath placement and initiation of chemotherapy. 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 Noted to have leakage of serous fluid from biopsy puncture site though to be related to anasarca 6/19 port placed 6/20 received first chemo- CHOP and received 2 [facility]  6/23 underwent repeat right thoracentesis for therapeutic purposes- 1.7 L removed 6/23 developed VDRF and septic shoch from aspiration to RLL- started on Unasyn 6/25 extubated and found to have right subclavian DVT- started on Heparin infusion    Subjective: No complaints today.  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 - WBC 0.7>> 0.6   Granix being given daily until ANC > 1000 for 2 consecutive dailys - Plt 25 >> 16  Heparin infusion being held- on bleeding or bruising noted    Hypomag/ hypokalemia - replaced    Pleural  effusions - s/p right thoracentesis x 2- follow     Right subclavian DVT - have had to stop Heparin infusion due to acute thrombocytopenia  Aspiration pneumonia with septic shock and VDRF - weaned off pressors and extubated on 6/25 - Augmentin stop date 6/28  Steroid induced hyperglycemia - due to Decadron which has been discontinued- sugars have since improved- d/c Insluin  Hyponatremia/ anasarca - improving- has pedal edema still- placed TEDS   DVT prophylaxis: SCDs Code Status: full code Family Communication:  Disposition Plan: cont to follow in SDU Consultants:   Oncology  IR  PCCM Procedures:   See above Antimicrobials:  Anti-infectives (From admission, onward)   Start     Dose/Rate Route Frequency Ordered Stop   05/14/18 1000  amoxicillin-clavulanate (AUGMENTIN) 875-125 MG per tablet 1 tablet     1 tablet Oral Every 12 hours 05/14/18 0803 05/17/18 0959   05/11/18 1300  Ampicillin-Sulbactam (UNASYN) 3 g in sodium chloride 0.9 % 100 mL IVPB  Status:  Discontinued     3 g 200 mL/hr over 30 Minutes Intravenous Every 6 hours 05/11/18 1219 05/14/18 0803   05/07/18 1530  ceFAZolin (ANCEF) IVPB 2g/100 mL premix     2 g 200 mL/hr over 30 Minutes Intravenous  Once 05/07/18 1524 05/07/18 1641   04/25/18 1130  amoxicillin-clavulanate (AUGMENTIN) 875-125 MG per tablet 1 tablet     1 tablet Oral Every 12 hours 04/25/18 1124 04/28/18 2143   04/25/18 1000  azithromycin (ZITHROMAX) tablet 250 mg  250 mg Oral Daily 04/24/18 1100 04/28/18 0925   04/24/18 1200  amoxicillin-clavulanate (AUGMENTIN) 500-125 MG per tablet 500 mg  Status:  Discontinued     1 tablet Oral 2 times daily 04/24/18 1100 04/25/18 1124   04/24/18 1115  azithromycin (ZITHROMAX) tablet 500 mg     500 mg Oral Daily 04/24/18 1100 04/24/18 1246       Objective: Vitals:   05/16/18 0500 05/16/18 0750 05/16/18 0800 05/16/18 0817  BP:    (!) 136/50  Pulse:      Resp:   (!) 29   Temp:  98.4 F (36.9 C)      TempSrc:  Oral    SpO2:   94%   Weight: 64.2 kg (141 lb 8.6 oz)     Height:        Intake/Output Summary (Last 24 hours) at 05/16/2018 1009 Last data filed at 05/16/2018 0346 Gross per 24 hour  Intake 525 ml  Output 602 ml  Net -77 ml   Filed Weights   05/14/18 0600 05/15/18 0600 05/16/18 0500  Weight: 63.4 kg (139 lb 12.4 oz) 61.9 kg (136 lb 7.4 oz) 64.2 kg (141 lb 8.6 oz)    Examination: General exam: Appears comfortable  HEENT: PERRLA, oral mucosa moist, no sclera icterus or thrush Respiratory system: Clear to auscultation. Respiratory effort normal. Cardiovascular system: S1 & S2 heard, RRR.   Gastrointestinal system: Abdomen soft, non-tender, nondistended. Normal bowel sound. No organomegaly Central nervous system: Alert and oriented. No focal neurological deficits. Extremities: No cyanosis, clubbing or edema Skin: No rashes or ulcers Psychiatry:  Mood & affect appropriate.     Data Reviewed: I have personally reviewed following labs and imaging studies  CBC: Recent Labs  Lab 05/10/18 0952  05/13/18 2232 05/14/18 0324 05/15/18 0321 05/15/18 0809 05/15/18 1815 05/16/18 0500  WBC 3.0*   < > 6.1 4.8 1.0* 0.7*  --  0.6*  NEUTROABS 2.3  --   --  4.4  --  0.4  --  0.4  HGB 8.9*   < > 8.3* 8.1* 7.1* 7.5* 9.1* 8.8*  HCT 27.3*   < > 25.2* 25.2* 21.5* 22.4* 26.6* 26.1*  MCV 91.0   < > 89.4 88.7 86.3 86.2  --  84.5  PLT 106*   < > 42* 39* 29* 25*  --  16*   < > = values in this interval not displayed.   Basic Metabolic Panel: Recent Labs  Lab 05/10/18 0952  05/12/18 0500 05/12/18 1013 05/12/18 1617 05/13/18 0526 05/13/18 1545 05/14/18 0324 05/15/18 0321 05/16/18 0500  NA 138   < > 141  --   --  143  --  141 138 136  K 3.9   < > 3.6  --   --  3.3*  --  3.3* 3.1* 4.0  CL 107   < > 109  --   --  109  --  106 104 104  CO2 27   < > 29  --   --  31  --  31 32 28  GLUCOSE 234*   < > 115*  --   --  150*  --  104* 114* 148*  BUN 24*   < > 26*  --   --  23  --  _0 CREATININE 0.56   < > 0.50  --   --  0.46  --  0.38* 0.35* 0.38*  CALCIUM 8.1*   < > 7.8*  --   --  7.9*  --  7.9* 7.7* 7.5*  MG 1.9  --   --  1.7 1.7 1.8 1.6*  --  1.6* 1.9  PHOS 3.1  --   --  2.6 3.0 2.5 2.3*  --   --   --    < > = values in this interval not displayed.   GFR: Estimated Creatinine Clearance: 63.9 mL/min (A) (by C-G formula based on SCr of 0.38 mg/dL (L)). Liver Function Tests: Recent Labs  Lab 05/10/18 0952 05/12/18 0500 05/13/18 0526 05/14/18 0324  AST _0 ALT _1 ALKPHOS 48 42 52 60  BILITOT 0.4 0.6 0.5 0.7  PROT 7.1 5.4* 5.4* 5.6*  ALBUMIN 2.6* 1.9* 2.1* 2.2*   No results for input(s): LIPASE, AMYLASE in the last 168 hours. No results for input(s): AMMONIA in the last 168 hours. Coagulation Profile: Recent Labs  Lab 05/12/18 0500  INR 1.13   Cardiac Enzymes: No results for input(s): CKTOTAL, CKMB, CKMBINDEX, TROPONINI in the last 168 hours. BNP (last 3 results) No results for input(s): PROBNP in the last 8760 hours. HbA1C: No results for input(s): HGBA1C in the last 72 hours. CBG: Recent Labs  Lab 05/14/18 1120 05/14/18 1728 05/14/18 2106 05/15/18 0754 05/15/18 1151  GLUCAP 122* 117* 140* 125* 143*   Lipid Profile: No results for input(s): CHOL, HDL, LDLCALC, TRIG, CHOLHDL, LDLDIRECT in the last 72 hours. Thyroid Function Tests: No results for input(s): TSH, T4TOTAL, FREET4, T3FREE, THYROIDAB in the last 72 hours. Anemia Panel: No results for input(s): VITAMINB12, FOLATE, FERRITIN, TIBC, IRON, RETICCTPCT in the last 72 hours. Urine analysis:    Component Value Date/Time   COLORURINE AMBER (A) 04/18/2018 0552   APPEARANCEUR HAZY (A) 04/18/2018 0552   LABSPEC 1.020 04/18/2018 0552   PHURINE 5.0 04/18/2018 0552   GLUCOSEU NEGATIVE 04/18/2018 0552   HGBUR NEGATIVE 04/18/2018 0552   BILIRUBINUR NEGATIVE 04/18/2018 0552   KETONESUR 5 (A) 04/18/2018 0552   PROTEINUR 30 (A) 04/18/2018 0552   NITRITE NEGATIVE  04/18/2018 0552   LEUKOCYTESUR NEGATIVE 04/18/2018 0552   Sepsis Labs: _2 (procalcitonin:4,lacticidven:4) ) Recent Results (from the past 240 hour(s))  MRSA PCR Screening     Status: None   Collection Time: 05/11/18  1:45 PM  Result Value Ref Range Status   MRSA by PCR NEGATIVE NEGATIVE Final    Comment:        The GeneXpert MRSA Assay (FDA approved for NASAL specimens only), is one component of a comprehensive MRSA colonization surveillance program. It is not intended to diagnose MRSA infection nor to guide or monitor treatment for MRSA infections. Performed at [facility], [place] [address] [place], [place] [address]   Culture, blood (Routine X 2) w Reflex to ID Panel     Status: None   Collection Time: 05/11/18  2:38 PM  Result Value Ref Range Status   Specimen Description   Final    BLOOD RIGHT ANTECUBITAL Performed at [facility] [address] [place], [place] [address]    Special Requests   Final    BOTTLES DRAWN AEROBIC ONLY Blood Culture adequate volume Performed at [facility] [address] [place], [place] [address]    Culture   Final    NO GROWTH 5 DAYS Performed at [facility], [place] [address] [place], [place] [address]    Report Status 05/16/2018 FINAL  Final  Culture, blood (Routine X 2) w Reflex to ID  Panel     Status: None   Collection Time: 05/11/18  2:38 PM  Result Value Ref Range Status   Specimen Description   Final    BLOOD LEFT HAND Performed at [facility], [place] [address] [place], [place] [address]    Special Requests   Final    BOTTLES DRAWN AEROBIC ONLY Blood Culture results may not be optimal due to an inadequate volume of blood received in culture bottles Performed at [facility] [address], [place], [place] [address]    Culture   Final    NO GROWTH 5 DAYS Performed at [facility], [place] [address] [place], [place]  [address]    Report Status 05/16/2018 FINAL  Final  Culture, respiratory (NON-Expectorated)     Status: None   Collection Time: 05/11/18  3:11 PM  Result Value Ref Range Status   Specimen Description   Final    TRACHEAL ASPIRATE Performed at [facility] [address], [place], [place] [address]    Special Requests   Final    NONE Performed at [facility], [place] [address] [place], [place] [address]    Gram Stain NO WBC SEEN NO ORGANISMS SEEN   Final   Culture   Final    Consistent with normal respiratory flora. Performed at [facility], [place] [address] [place], [place] [address]    Report Status 05/14/2018 FINAL  Final         Radiology Studies: No results found.    Scheduled Meds: . allopurinol  300 mg Oral Daily  . amoxicillin-clavulanate  1 tablet Oral Q12H  . Chlorhexidine Gluconate Cloth  6 each Topical Daily  . mouth rinse  15 mL Mouth Rinse BID  . multivitamin with minerals  1 tablet Oral Daily  . sodium chloride flush  10-40 mL Intracatheter Q12H  . sodium chloride flush  3 mL Intravenous Q12H  . Tbo-filgastrim (GRANIX) SQ  480 mcg Subcutaneous q1800   Continuous Infusions: . sodium chloride       LOS: 21 days    Time spent in minutes: 35    Debbe Odea, MD [facility] Pager: [email or web address] Password [facility] 05/16/2018, 10:09 AM

## 2018-05-17 LAB — BPAM PLATELET PHERESIS
Blood Product Expiration Date: 201907012359
ISSUE DATE / TIME: 201906282213
UNIT TYPE AND RH: 7300

## 2018-05-17 LAB — BASIC METABOLIC PANEL
ANION GAP: 6 (ref 5–15)
BUN: 10 mg/dL (ref 8–23)
CALCIUM: 7.5 mg/dL — AB (ref 8.9–10.3)
CO2: 26 mmol/L (ref 22–32)
Chloride: 102 mmol/L (ref 98–111)
Creatinine, Ser: 0.4 mg/dL — ABNORMAL LOW (ref 0.44–1.00)
GFR calc non Af Amer: >60 mL/min (ref >60)
Glucose, Bld: 134 mg/dL — ABNORMAL HIGH (ref 70–99)
Potassium: 3.2 mmol/L — ABNORMAL LOW (ref 3.5–5.1)
Sodium: 134 mmol/L — ABNORMAL LOW (ref 135–145)

## 2018-05-17 LAB — PREPARE PLATELET PHERESIS: Unit division: 0

## 2018-05-17 LAB — CBC WITH DIFFERENTIAL/PLATELET
BASOS ABS: 0 10*3/uL (ref 0.0–0.1)
Basophils Relative: 2 %
Eosinophils Absolute: 0 10*3/uL (ref 0.0–0.7)
Eosinophils Relative: 2 %
HEMATOCRIT: 23.9 % — AB (ref 36.0–46.0)
Hemoglobin: 8.3 g/dL — ABNORMAL LOW (ref 12.0–15.0)
LYMPHS ABS: 0.2 10*3/uL — AB (ref 0.7–4.0)
LYMPHS PCT: 33 %
MCH: 28.7 pg (ref 26.0–34.0)
MCHC: 34.7 g/dL (ref 30.0–36.0)
MCV: 82.7 fL (ref 78.0–100.0)
MONOS PCT: 5 %
Monocytes Absolute: 0 10*3/uL — ABNORMAL LOW (ref 0.1–1.0)
NEUTROS PCT: 58 %
Neutro Abs: 0.4 10*3/uL — ABNORMAL LOW (ref 1.7–7.7)
Platelets: 23 10*3/uL — CL (ref 150–400)
RBC: 2.89 MIL/uL — AB (ref 3.87–5.11)
RDW: 15.6 % — AB (ref 11.5–15.5)
WBC: 0.6 10*3/uL — AB (ref 4.0–10.5)

## 2018-05-17 LAB — CMV DNA BY PCR, QUALITATIVE: CMV DNA QL PCR: POSITIVE — AB

## 2018-05-17 LAB — MAGNESIUM: Magnesium: 1.7 mg/dL (ref 1.7–2.4)

## 2018-05-17 MED ORDER — TRAZODONE HCL 50 MG PO TABS
50.0000 mg | ORAL_TABLET | Freq: Once | ORAL | Status: AC
  Administered 2018-05-17: 50 mg via ORAL
  Filled 2018-05-17: qty 1

## 2018-05-17 MED ORDER — POTASSIUM CHLORIDE CRYS ER 20 MEQ PO TBCR
40.0000 meq | EXTENDED_RELEASE_TABLET | ORAL | Status: AC
  Administered 2018-05-17 (×2): 40 meq via ORAL
  Filled 2018-05-17 (×2): qty 2

## 2018-05-17 NOTE — Progress Notes (Signed)
Gave patient a first dose of Tbo-filgrastim (Granix) and she developed 3 rash sites - one on the upper right arm, one on the upper right chest above the port and one on the left upper arm. Patient stated it was itching - vital signs checked out normal within the same trend, no fever - itching subsided after about 10 minutes with no further complications or rashes.

## 2018-05-17 NOTE — Progress Notes (Signed)
PROGRESS NOTE    Deborah Cooley   BHA:[id number]  DOB: [birth year]  DOA: 04/23/2018 PCP: Patient, No Pcp Per   Brief Narrative:  Deborah Cooley is a 62 y/o with  Nicotine abuse, b/l pleural effusions and diffuse lymphadenopathy who was last admitted on 5/30 and underwent a thoracentesis of right pleural effusion on 5/31 which showed nonspecific atypical lymphocytosis and then a left axillary excisional LN biopsy on 6/3 and then discharged.   She presented back 2 days later with dyspnea, leg swelling and chest and stomach pain. She was also noted to have a RML opacity and was treated for CAP with Augmentin and Azithromycin for 5 days. Hospital course was complicated by hyponatremia and anasarca.  LN Biopsy results revealed Angioimmunoblastic t-cell lymphoma and she was transferred to [facility] long on 6/18 for port a cath placement and initiation of chemotherapy. 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 Noted to have leakage of serous fluid from biopsy puncture site though to be related to anasarca 6/19 port placed 6/20 received first chemo- [facility] and received 2 [facility]  6/23 underwent repeat right thoracentesis for therapeutic purposes- 1.7 L removed 6/23 developed VDRF and septic shoch from aspiration to RLL- started on Unasyn 6/25 extubated and found to have right subclavian DVT- started on Heparin infusion    Subjective: No complaints today. She would like to walk and I have encouraged her to walk twice today.  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 - WBC 0.7>> 0.6 >> 0.6  Granix being given daily until ANC > 1000 for 2 consecutive dailys - Plt 25 >> 16>> 23  Heparin infusion being  held- on bleeding or bruising noted    Hypomag/ hypokalemia - replaced    Pleural effusions - s/p right thoracentesis x 2- follow     Right subclavian DVT - have had to stop Heparin infusion due to acute thrombocytopenia  Aspiration pneumonia with septic shock and VDRF - weaned off pressors and extubated on 6/25 - Augmentin stop date 6/28  Steroid induced hyperglycemia - due to Decadron which has been discontinued- sugars have since improved- d/c Insluin  Hyponatremia/ anasarca - improving- has pedal edema still- placed TEDS   DVT prophylaxis: SCDs Code Status: full code Family Communication:  Disposition Plan: cont to follow in SDU Consultants:   Oncology  IR  PCCM Procedures:   See above Antimicrobials:  Anti-infectives (From admission, onward)   Start     Dose/Rate Route Frequency Ordered Stop   05/14/18 1000  amoxicillin-clavulanate (AUGMENTIN) 875-125 MG per tablet 1 tablet     1 tablet Oral Every 12 hours 05/14/18 0803 05/16/18 2254   05/11/18 1300  Ampicillin-Sulbactam (UNASYN) 3 g in sodium chloride 0.9 % 100 mL IVPB  Status:  Discontinued     3 g 200 mL/hr over 30 Minutes Intravenous Every 6 hours 05/11/18 1219 05/14/18 0803   05/07/18 1530  ceFAZolin (ANCEF) IVPB 2g/100 mL premix     2 g 200 mL/hr over 30 Minutes Intravenous  Once 05/07/18 1524 05/07/18 1641   04/25/18 1130  amoxicillin-clavulanate (AUGMENTIN) 875-125 MG per tablet 1 tablet     1 tablet Oral  Every 12 hours 04/25/18 1124 04/28/18 2143   04/25/18 1000  azithromycin (ZITHROMAX) tablet 250 mg     250 mg Oral Daily 04/24/18 1100 04/28/18 0925   04/24/18 1200  amoxicillin-clavulanate (AUGMENTIN) 500-125 MG per tablet 500 mg  Status:  Discontinued     1 tablet Oral 2 times daily 04/24/18 1100 04/25/18 1124   04/24/18 1115  azithromycin (ZITHROMAX) tablet 500 mg     500 mg Oral Daily 04/24/18 1100 04/24/18 1246       Objective: Vitals:   05/16/18 2235 05/16/18 2304 05/17/18 0010  05/17/18 0523  BP: (!) 138/58 120/68 130/62 140/60  Pulse: 85  96 93  Resp: _0 Temp: 97.8 F (36.6 C) 98.9 F (37.2 C) 98.3 F (36.8 C) 99 F (37.2 C)  TempSrc: Oral Oral Oral Oral  SpO2: 95% 96% 94% 94%  Weight:    65.2 kg (143 lb 11.8 oz)  Height:        Intake/Output Summary (Last 24 hours) at 05/17/2018 1106 Last data filed at 05/17/2018 0010 Gross per 24 hour  Intake 763 ml  Output -  Net 763 ml   Filed Weights   05/15/18 0600 05/16/18 0500 05/17/18 0523  Weight: 61.9 kg (136 lb 7.4 oz) 64.2 kg (141 lb 8.6 oz) 65.2 kg (143 lb 11.8 oz)    Examination: General exam: Appears comfortable  HEENT: PERRLA, oral mucosa moist, no sclera icterus or thrush Respiratory system: Clear to auscultation. Respiratory effort normal. Cardiovascular system: S1 & S2 heard, RRR.   Gastrointestinal system: Abdomen soft, non-tender, nondistended. Normal bowel sound. No organomegaly Central nervous system: Alert and oriented. No focal neurological deficits. Extremities: No cyanosis, clubbing or edema Skin: No rashes or ulcers Psychiatry:  Mood & affect appropriate.     Data Reviewed: I have personally reviewed following labs and imaging studies  CBC: Recent Labs  Lab 05/14/18 0324 05/15/18 0321 05/15/18 0809 05/15/18 1815 05/16/18 0500 05/17/18 0517  WBC 4.8 1.0* 0.7*  --  0.6* 0.6*  NEUTROABS 4.4  --  0.4  --  0.4 0.4*  HGB 8.1* 7.1* 7.5* 9.1* 8.8* 8.3*  HCT 25.2* 21.5* 22.4* 26.6* 26.1* 23.9*  MCV 88.7 86.3 86.2  --  84.5 82.7  PLT 39* 29* 25*  --  16* 23*   Basic Metabolic Panel: Recent Labs  Lab 05/12/18 1013 05/12/18 1617 05/13/18 0526 05/13/18 1545 05/14/18 0324 05/15/18 0321 05/16/18 0500 05/17/18 0517  NA  --   --  143  --  141 138 136 134*  K  --   --  3.3*  --  3.3* 3.1* 4.0 3.2*  CL  --   --  109  --  106 104 104 102  CO2  --   --  31  --  31 32 28 26  GLUCOSE  --   --  150*  --  104* 114* 148* 134*  BUN  --   --  23  --  _1 CREATININE  --   --  0.46  --  0.38* 0.35* 0.38* 0.40*  CALCIUM  --   --  7.9*  --  7.9* 7.7* 7.5* 7.5*  MG 1.7 1.7 1.8 1.6*  --  1.6* 1.9 1.7  PHOS 2.6 3.0 2.5 2.3*  --   --   --   --    GFR: Estimated Creatinine Clearance: 64.3 mL/min (A) (by C-G formula based on SCr of 0.4 mg/dL (L)).  Liver Function Tests: Recent Labs  Lab 05/12/18 0500 05/13/18 0526 05/14/18 0324  AST _0 ALT _1 ALKPHOS 42 52 60  BILITOT 0.6 0.5 0.7  PROT 5.4* 5.4* 5.6*  ALBUMIN 1.9* 2.1* 2.2*   No results for input(s): LIPASE, AMYLASE in the last 168 hours. No results for input(s): AMMONIA in the last 168 hours. Coagulation Profile: Recent Labs  Lab 05/12/18 0500  INR 1.13   Cardiac Enzymes: No results for input(s): CKTOTAL, CKMB, CKMBINDEX, TROPONINI in the last 168 hours. BNP (last 3 results) No results for input(s): PROBNP in the last 8760 hours. HbA1C: No results for input(s): HGBA1C in the last 72 hours. CBG: Recent Labs  Lab 05/14/18 1120 05/14/18 1728 05/14/18 2106 05/15/18 0754 05/15/18 1151  GLUCAP 122* 117* 140* 125* 143*   Lipid Profile: No results for input(s): CHOL, HDL, LDLCALC, TRIG, CHOLHDL, LDLDIRECT in the last 72 hours. Thyroid Function Tests: No results for input(s): TSH, T4TOTAL, FREET4, T3FREE, THYROIDAB in the last 72 hours. Anemia Panel: No results for input(s): VITAMINB12, FOLATE, FERRITIN, TIBC, IRON, RETICCTPCT in the last 72 hours. Urine analysis:    Component Value Date/Time   COLORURINE AMBER (A) 04/18/2018 0552   APPEARANCEUR HAZY (A) 04/18/2018 0552   LABSPEC 1.020 04/18/2018 0552   PHURINE 5.0 04/18/2018 0552   GLUCOSEU NEGATIVE 04/18/2018 0552   HGBUR NEGATIVE 04/18/2018 0552   BILIRUBINUR NEGATIVE 04/18/2018 0552   KETONESUR 5 (A) 04/18/2018 0552   PROTEINUR 30 (A) 04/18/2018 0552   NITRITE NEGATIVE 04/18/2018 0552   LEUKOCYTESUR NEGATIVE 04/18/2018 0552   Sepsis Labs: _2 (procalcitonin:4,lacticidven:4) ) Recent Results  (from the past 240 hour(s))  MRSA PCR Screening     Status: None   Collection Time: 05/11/18  1:45 PM  Result Value Ref Range Status   MRSA by PCR NEGATIVE NEGATIVE Final    Comment:        The GeneXpert MRSA Assay (FDA approved for NASAL specimens only), is one component of a comprehensive MRSA colonization surveillance program. It is not intended to diagnose MRSA infection nor to guide or monitor treatment for MRSA infections. Performed at [facility], [place] [address], [place], [place] [address]   Culture, blood (Routine X 2) w Reflex to ID Panel     Status: None   Collection Time: 05/11/18  2:38 PM  Result Value Ref Range Status   Specimen Description   Final    BLOOD RIGHT ANTECUBITAL Performed at [facility] [address] [place], [place] [address]    Special Requests   Final    BOTTLES DRAWN AEROBIC ONLY Blood Culture adequate volume Performed at [facility] [address], [place], [place] [address]    Culture   Final    NO GROWTH 5 DAYS Performed at [facility], [place] [address], [place], [place] [address]    Report Status 05/16/2018 FINAL  Final  Culture, blood (Routine X 2) w Reflex to ID Panel     Status: None   Collection Time: 05/11/18  2:38 PM  Result Value Ref Range Status   Specimen Description   Final    BLOOD LEFT HAND Performed at [facility], [place] [address], [place],  [address]    Special Requests   Final    BOTTLES DRAWN AEROBIC ONLY Blood Culture results may not be optimal due to an inadequate volume of blood received in culture bottles Performed at [facility], [place] [address] [place], [place] [address]    Culture   Final    NO GROWTH 5 DAYS Performed at [facility], [place] [address], [place], [place] [address]    Report Status 05/16/2018 FINAL  Final  Culture, respiratory (NON-Expectorated)     Status: None   Collection Time: 05/11/18   3:11 PM  Result Value Ref Range Status   Specimen Description   Final    TRACHEAL ASPIRATE Performed at [facility] [address], [place], [place] [address]    Special Requests   Final    NONE Performed at [facility], [place] [address] [place], [place] [address]    Gram Stain NO WBC SEEN NO ORGANISMS SEEN   Final   Culture   Final    Consistent with normal respiratory flora. Performed at [facility], [place] [address], [place], [place] [address]    Report Status 05/14/2018 FINAL  Final         Radiology Studies: No results found.    Scheduled Meds: . allopurinol  300 mg Oral Daily  . Chlorhexidine Gluconate Cloth  6 each Topical Daily  . mouth rinse  15 mL Mouth Rinse BID  . multivitamin with minerals  1 tablet Oral Daily  . potassium chloride  40 mEq Oral Q4H  . sodium chloride flush  10-40 mL Intracatheter Q12H  . sodium chloride flush  3 mL Intravenous Q12H  . Tbo-filgastrim (GRANIX) SQ  480 mcg Subcutaneous q1800   Continuous Infusions: . sodium chloride    . sodium chloride Stopped (05/16/18 2321)     LOS: 22 days    Time spent in minutes: [place], MD [facility] Pager: [email or web address] Password [facility] 05/17/2018, 11:06 AM

## 2018-05-18 DIAGNOSIS — D630 Anemia in neoplastic disease: Secondary | ICD-10-CM

## 2018-05-18 LAB — CBC WITH DIFFERENTIAL/PLATELET
Basophils Absolute: 0 10*3/uL (ref 0.0–0.1)
Basophils Relative: 1 %
EOS ABS: 0 10*3/uL (ref 0.0–0.7)
Eosinophils Relative: 1 %
HEMATOCRIT: 23.9 % — AB (ref 36.0–46.0)
HEMOGLOBIN: 8.2 g/dL — AB (ref 12.0–15.0)
LYMPHS PCT: 20 %
Lymphs Abs: 0.2 10*3/uL — ABNORMAL LOW (ref 0.7–4.0)
MCH: 28.3 pg (ref 26.0–34.0)
MCHC: 34.3 g/dL (ref 30.0–36.0)
MCV: 82.4 fL (ref 78.0–100.0)
MONOS PCT: 15 %
Monocytes Absolute: 0.2 10*3/uL (ref 0.1–1.0)
NEUTROS PCT: 63 %
Neutro Abs: 0.8 10*3/uL — ABNORMAL LOW (ref 1.7–7.7)
Platelets: 37 10*3/uL — ABNORMAL LOW (ref 150–400)
RBC: 2.9 MIL/uL — ABNORMAL LOW (ref 3.87–5.11)
RDW: 15.6 % — ABNORMAL HIGH (ref 11.5–15.5)
WBC: 1.2 10*3/uL — CL (ref 4.0–10.5)

## 2018-05-18 LAB — BASIC METABOLIC PANEL
Anion gap: 6 (ref 5–15)
BUN: 8 mg/dL (ref 8–23)
CHLORIDE: 103 mmol/L (ref 98–111)
CO2: 26 mmol/L (ref 22–32)
Calcium: 7.9 mg/dL — ABNORMAL LOW (ref 8.9–10.3)
Creatinine, Ser: 0.43 mg/dL — ABNORMAL LOW (ref 0.44–1.00)
GFR calc non Af Amer: >60 mL/min (ref >60)
Glucose, Bld: 133 mg/dL — ABNORMAL HIGH (ref 70–99)
POTASSIUM: 3.7 mmol/L (ref 3.5–5.1)
SODIUM: 135 mmol/L (ref 135–145)

## 2018-05-18 LAB — MAGNESIUM: Magnesium: 1.6 mg/dL — ABNORMAL LOW (ref 1.7–2.4)

## 2018-05-18 MED ORDER — TRAZODONE HCL 50 MG PO TABS
50.0000 mg | ORAL_TABLET | Freq: Once | ORAL | Status: AC
  Administered 2018-05-18: 50 mg via ORAL
  Filled 2018-05-18: qty 1

## 2018-05-18 NOTE — Progress Notes (Signed)
PROGRESS NOTE    Deborah Cooley   IZT:[id number]  DOB: 1956/03/06  DOA: 04/23/2018 PCP: Patient, No Pcp Per   Brief Narrative:  Deborah Cooley is a 62 y/o with  Nicotine abuse, b/l pleural effusions and diffuse lymphadenopathy who was last admitted on 5/30 and underwent a thoracentesis of right pleural effusion on 5/31 which showed nonspecific atypical lymphocytosis and then a left axillary excisional LN biopsy on 6/3 and then discharged.   She presented back 2 days later with dyspnea, leg swelling and chest and stomach pain. She was also noted to have a RML opacity and was treated for CAP with Augmentin and Azithromycin for 5 days. Hospital course was complicated by hyponatremia and anasarca.  LN Biopsy results revealed Angioimmunoblastic t-cell lymphoma and she was transferred to [facility] long on 6/18 for port a cath placement and initiation of chemotherapy. 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 Noted to have leakage of serous fluid from biopsy puncture site though to be related to anasarca 6/19 port placed 6/20 received first chemo- [facility] and received 2 [facility]  6/23 underwent repeat right thoracentesis for therapeutic purposes- 1.7 L removed 6/23 developed VDRF and septic shock from aspiration to RLL- started on Unasyn 6/25 extubated and found to have right subclavian DVT- started on Heparin infusion    Subjective: She is doing quite well and is ambulating without issues. No new complaints.   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 - WBC 0.7>> 0.6 >> 0.6>1.2  Granix being given daily until ANC > 1000 for 2 consecutive dailys - Plt 25 >> 16>> 23> 37  - Heparin infusion being  held until Plt > 50K- on bleeding or bruising noted    Hypomag/ hypokalemia - replaced    Pleural effusions - s/p right thoracentesis x 2- follow     Right subclavian DVT - have had to stop Heparin infusion due to acute thrombocytopenia  Aspiration pneumonia with septic shock and VDRF - weaned off pressors and extubated on 6/25 - Augmentin stop date 6/28  Steroid induced hyperglycemia - due to Decadron which has been discontinued- sugars have since improved- d/c Insluin  Hyponatremia/ anasarca - improving- has pedal edema still- placed TEDS   DVT prophylaxis: SCDs Code Status: full code Family Communication:  Disposition Plan: cont to follow in SDU Consultants:   Oncology  IR  PCCM Procedures:   See above Antimicrobials:  Anti-infectives (From admission, onward)   Start     Dose/Rate Route Frequency Ordered Stop   05/14/18 1000  amoxicillin-clavulanate (AUGMENTIN) 875-125 MG per tablet 1 tablet     1 tablet Oral Every 12 hours 05/14/18 0803 05/16/18 2254   05/11/18 1300  Ampicillin-Sulbactam (UNASYN) 3 g in sodium chloride 0.9 % 100 mL IVPB  Status:  Discontinued     3 g 200 mL/hr over 30 Minutes Intravenous Every 6 hours 05/11/18 1219 05/14/18 0803   05/07/18 1530  ceFAZolin (ANCEF) IVPB 2g/100 mL premix     2 g 200 mL/hr over 30 Minutes Intravenous  Once 05/07/18 1524 05/07/18 1641   04/25/18 1130  amoxicillin-clavulanate (AUGMENTIN) 875-125 MG per tablet 1 tablet  1 tablet Oral Every 12 hours 04/25/18 1124 04/28/18 2143   04/25/18 1000  azithromycin (ZITHROMAX) tablet 250 mg     250 mg Oral Daily 04/24/18 1100 04/28/18 0925   04/24/18 1200  amoxicillin-clavulanate (AUGMENTIN) 500-125 MG per tablet 500 mg  Status:  Discontinued     1 tablet Oral 2 times daily 04/24/18 1100 04/25/18 1124   04/24/18 1115  azithromycin (ZITHROMAX) tablet 500 mg     500 mg Oral Daily 04/24/18 1100 04/24/18 1246       Objective: Vitals:   05/17/18 1312 05/17/18 2051  05/17/18 2054 05/18/18 0507  BP: (!) 134/55  (!) 124/49 (P) 132/60  Pulse: 92 100 94 97  Resp: 16  (!) 24 20  Temp: 98.7 F (37.1 C)  98.6 F (37 C) 98.6 F (37 C)  TempSrc: Oral  Oral Oral  SpO2: 96% 97% 96% 95%  Weight:    (P) 58.5 kg (128 lb 15.5 oz)  Height:        Intake/Output Summary (Last 24 hours) at 05/18/2018 1140 Last data filed at 05/18/2018 1033 Gross per 24 hour  Intake 720 ml  Output -  Net 720 ml   Filed Weights   05/16/18 0500 05/17/18 0523 05/18/18 0507  Weight: 64.2 kg (141 lb 8.6 oz) 65.2 kg (143 lb 11.8 oz) (P) 58.5 kg (128 lb 15.5 oz)    Examination: General exam: Appears comfortable  HEENT: PERRLA, oral mucosa moist, no sclera icterus or thrush Respiratory system: Clear to auscultation. Respiratory effort normal. Cardiovascular system: S1 & S2 heard, RRR.   Gastrointestinal system: Abdomen soft, non-tender, nondistended. Normal bowel sound. No organomegaly Central nervous system: Alert and oriented. No focal neurological deficits. Extremities: No cyanosis, clubbing or edema Skin: No rashes or ulcers Psychiatry:  Mood & affect appropriate.     Data Reviewed: I have personally reviewed following labs and imaging studies  CBC: Recent Labs  Lab 05/14/18 0324 05/15/18 0321 05/15/18 0809 05/15/18 1815 05/16/18 0500 05/17/18 0517 05/18/18 0500  WBC 4.8 1.0* 0.7*  --  0.6* 0.6* 1.2*  NEUTROABS 4.4  --  0.4  --  0.4 0.4* 0.8*  HGB 8.1* 7.1* 7.5* 9.1* 8.8* 8.3* 8.2*  HCT 25.2* 21.5* 22.4* 26.6* 26.1* 23.9* 23.9*  MCV 88.7 86.3 86.2  --  84.5 82.7 82.4  PLT 39* 29* 25*  --  16* 23* 37*   Basic Metabolic Panel: Recent Labs  Lab 05/12/18 1013 05/12/18 1617 05/13/18 0526 05/13/18 1545 05/14/18 0324 05/15/18 0321 05/16/18 0500 05/17/18 0517 05/18/18 0500  NA  --   --  143  --  141 138 136 134* 135  K  --   --  3.3*  --  3.3* 3.1* 4.0 3.2* 3.7  CL  --   --  109  --  106 104 104 102 103  CO2  --   --  31  --  31 32 _0 GLUCOSE  --    --  150*  --  104* 114* 148* 134* 133*  BUN  --   --  23  --  _1 CREATININE  --   --  0.46  --  0.38* 0.35* 0.38* 0.40* 0.43*  CALCIUM  --   --  7.9*  --  7.9* 7.7* 7.5* 7.5* 7.9*  MG 1.7 1.7 1.8 1.6*  --  1.6* 1.9 1.7 1.6*  PHOS 2.6 3.0 2.5 2.3*  --   --   --   --   --  GFR: Estimated Creatinine Clearance: 64.3 mL/min (A) (by C-G formula based on SCr of 0.43 mg/dL (L)). Liver Function Tests: Recent Labs  Lab 05/12/18 0500 05/13/18 0526 05/14/18 0324  AST _0 ALT _1 ALKPHOS 42 52 60  BILITOT 0.6 0.5 0.7  PROT 5.4* 5.4* 5.6*  ALBUMIN 1.9* 2.1* 2.2*   No results for input(s): LIPASE, AMYLASE in the last 168 hours. No results for input(s): AMMONIA in the last 168 hours. Coagulation Profile: Recent Labs  Lab 05/12/18 0500  INR 1.13   Cardiac Enzymes: No results for input(s): CKTOTAL, CKMB, CKMBINDEX, TROPONINI in the last 168 hours. BNP (last 3 results) No results for input(s): PROBNP in the last 8760 hours. HbA1C: No results for input(s): HGBA1C in the last 72 hours. CBG: Recent Labs  Lab 05/14/18 1120 05/14/18 1728 05/14/18 2106 05/15/18 0754 05/15/18 1151  GLUCAP 122* 117* 140* 125* 143*   Lipid Profile: No results for input(s): CHOL, HDL, LDLCALC, TRIG, CHOLHDL, LDLDIRECT in the last 72 hours. Thyroid Function Tests: No results for input(s): TSH, T4TOTAL, FREET4, T3FREE, THYROIDAB in the last 72 hours. Anemia Panel: No results for input(s): VITAMINB12, FOLATE, FERRITIN, TIBC, IRON, RETICCTPCT in the last 72 hours. Urine analysis:    Component Value Date/Time   COLORURINE AMBER (A) 04/18/2018 0552   APPEARANCEUR HAZY (A) 04/18/2018 0552   LABSPEC 1.020 04/18/2018 0552   PHURINE 5.0 04/18/2018 0552   GLUCOSEU NEGATIVE 04/18/2018 0552   HGBUR NEGATIVE 04/18/2018 0552   BILIRUBINUR NEGATIVE 04/18/2018 0552   KETONESUR 5 (A) 04/18/2018 0552   PROTEINUR 30 (A) 04/18/2018 0552   NITRITE NEGATIVE 04/18/2018 0552   LEUKOCYTESUR  NEGATIVE 04/18/2018 0552   Sepsis Labs: _2 (procalcitonin:4,lacticidven:4) ) Recent Results (from the past 240 hour(s))  MRSA PCR Screening     Status: None   Collection Time: 05/11/18  1:45 PM  Result Value Ref Range Status   MRSA by PCR NEGATIVE NEGATIVE Final    Comment:        The GeneXpert MRSA Assay (FDA approved for NASAL specimens only), is one component of a comprehensive MRSA colonization surveillance program. It is not intended to diagnose MRSA infection nor to guide or monitor treatment for MRSA infections. Performed at [facility], [place] [address], [place], [place] [address]   Culture, blood (Routine X 2) w Reflex to ID Panel     Status: None   Collection Time: 05/11/18  2:38 PM  Result Value Ref Range Status   Specimen Description   Final    BLOOD RIGHT ANTECUBITAL Performed at [facility] [address], [place], [place] [address]    Special Requests   Final    BOTTLES DRAWN AEROBIC ONLY Blood Culture adequate volume Performed at [facility] [address] [place], [place] [address]    Culture   Final    NO GROWTH 5 DAYS Performed at [facility], [place] [address], [place], [place] [address]    Report Status 05/16/2018 FINAL  Final  Culture, blood (Routine X 2) w Reflex to ID Panel     Status: None   Collection Time: 05/11/18  2:38 PM  Result Value Ref Range Status   Specimen Description   Final    BLOOD LEFT HAND Performed at [facility], [place] [address], [place], [place] [address]    Special Requests   Final    BOTTLES DRAWN AEROBIC ONLY Blood Culture results may not be  optimal due to an inadequate volume of blood received in culture bottles Performed at [facility] [address], [place], [place] [address]    Culture   Final    NO GROWTH 5 DAYS Performed at [facility], [place] [address], [place], [place] [address]    Report Status 05/16/2018  FINAL  Final  Culture, respiratory (NON-Expectorated)     Status: None   Collection Time: 05/11/18  3:11 PM  Result Value Ref Range Status   Specimen Description   Final    TRACHEAL ASPIRATE Performed at [facility] [address], [place], [place] [address]    Special Requests   Final    NONE Performed at [facility], [place] [address] [place], [place] [address]    Gram Stain NO WBC SEEN NO ORGANISMS SEEN   Final   Culture   Final    Consistent with normal respiratory flora. Performed at [facility], [place] [address], [place], [place] [address]    Report Status 05/14/2018 FINAL  Final         Radiology Studies: No results found.    Scheduled Meds: . allopurinol  300 mg Oral Daily  . Chlorhexidine Gluconate Cloth  6 each Topical Daily  . mouth rinse  15 mL Mouth Rinse BID  . multivitamin with minerals  1 tablet Oral Daily  . sodium chloride flush  10-40 mL Intracatheter Q12H  . sodium chloride flush  3 mL Intravenous Q12H  . Tbo-filgastrim (GRANIX) SQ  480 mcg Subcutaneous q1800   Continuous Infusions: . sodium chloride    . sodium chloride Stopped (05/16/18 2321)     LOS: 23 days    Time spent in minutes: [place], MD [facility] Pager: [email or web address] Password [email or web address] 05/18/2018, 11:40 AM

## 2018-05-19 DIAGNOSIS — J9601 Acute respiratory failure with hypoxia: Secondary | ICD-10-CM

## 2018-05-19 DIAGNOSIS — D702 Other drug-induced agranulocytosis: Secondary | ICD-10-CM

## 2018-05-19 DIAGNOSIS — J90 Pleural effusion, not elsewhere classified: Secondary | ICD-10-CM

## 2018-05-19 DIAGNOSIS — I82621 Acute embolism and thrombosis of deep veins of right upper extremity: Secondary | ICD-10-CM

## 2018-05-19 DIAGNOSIS — E039 Hypothyroidism, unspecified: Secondary | ICD-10-CM

## 2018-05-19 DIAGNOSIS — C8440 Peripheral T-cell lymphoma, not classified, unspecified site: Secondary | ICD-10-CM

## 2018-05-19 DIAGNOSIS — Z5111 Encounter for antineoplastic chemotherapy: Secondary | ICD-10-CM

## 2018-05-19 DIAGNOSIS — Z720 Tobacco use: Secondary | ICD-10-CM

## 2018-05-19 DIAGNOSIS — Z9889 Other specified postprocedural states: Secondary | ICD-10-CM

## 2018-05-19 DIAGNOSIS — D696 Thrombocytopenia, unspecified: Secondary | ICD-10-CM

## 2018-05-19 DIAGNOSIS — Z888 Allergy status to other drugs, medicaments and biological substances status: Secondary | ICD-10-CM

## 2018-05-19 DIAGNOSIS — R591 Generalized enlarged lymph nodes: Secondary | ICD-10-CM

## 2018-05-19 DIAGNOSIS — Z9189 Other specified personal risk factors, not elsewhere classified: Secondary | ICD-10-CM

## 2018-05-19 LAB — CBC WITH DIFFERENTIAL/PLATELET
BASOS ABS: 0 10*3/uL (ref 0.0–0.1)
BASOS PCT: 0 %
Eosinophils Absolute: 0 10*3/uL (ref 0.0–0.7)
Eosinophils Relative: 1 %
HCT: 22.9 % — ABNORMAL LOW (ref 36.0–46.0)
HEMOGLOBIN: 7.9 g/dL — AB (ref 12.0–15.0)
LYMPHS PCT: 13 %
Lymphs Abs: 0.3 10*3/uL (ref 0.7–4.0)
MCH: 28.3 pg (ref 26.0–34.0)
MCHC: 34.5 g/dL (ref 30.0–36.0)
MCV: 82.1 fL (ref 78.0–100.0)
Monocytes Absolute: 0.5 10*3/uL (ref 0.1–1.0)
Monocytes Relative: 19 %
NEUTROS ABS: 1.8 10*3/uL (ref 1.7–7.7)
Neutrophils Relative %: 67 %
Platelets: 69 10*3/uL — ABNORMAL LOW (ref 150–400)
RBC: 2.79 MIL/uL — ABNORMAL LOW (ref 3.87–5.11)
RDW: 15.5 % (ref 11.5–15.5)
WBC: 2.6 10*3/uL — AB (ref 4.0–10.5)

## 2018-05-19 LAB — BASIC METABOLIC PANEL
Anion gap: 8 (ref 5–15)
BUN: 8 mg/dL (ref 8–23)
CHLORIDE: 99 mmol/L (ref 98–111)
CO2: 25 mmol/L (ref 22–32)
CREATININE: 0.42 mg/dL — AB (ref 0.44–1.00)
Calcium: 7.8 mg/dL — ABNORMAL LOW (ref 8.9–10.3)
GFR calc Af Amer: >60 mL/min (ref >60)
GFR calc non Af Amer: >60 mL/min (ref >60)
Glucose, Bld: 115 mg/dL — ABNORMAL HIGH (ref 70–99)
Potassium: 3.2 mmol/L — ABNORMAL LOW (ref 3.5–5.1)
SODIUM: 132 mmol/L — AB (ref 135–145)

## 2018-05-19 LAB — HEMOGLOBIN AND HEMATOCRIT, BLOOD
HCT: 28.2 % — ABNORMAL LOW (ref 36.0–46.0)
Hemoglobin: 9.8 g/dL — ABNORMAL LOW (ref 12.0–15.0)

## 2018-05-19 LAB — PREPARE RBC (CROSSMATCH)

## 2018-05-19 LAB — MAGNESIUM: MAGNESIUM: 1.5 mg/dL — AB (ref 1.7–2.4)

## 2018-05-19 MED ORDER — POTASSIUM CHLORIDE CRYS ER 20 MEQ PO TBCR
40.0000 meq | EXTENDED_RELEASE_TABLET | ORAL | Status: AC
  Administered 2018-05-19 (×2): 40 meq via ORAL
  Filled 2018-05-19 (×2): qty 2

## 2018-05-19 MED ORDER — POLYETHYLENE GLYCOL 3350 17 G PO PACK: 17.0000 g | PACK | Freq: Every day | ORAL | 0 refills | Status: DC | PRN

## 2018-05-19 MED ORDER — ELIQUIS 5 MG VTE STARTER PACK: ORAL_TABLET | ORAL | 0 refills | Status: DC

## 2018-05-19 MED ORDER — ADULT MULTIVITAMIN W/MINERALS CH
1.0000 | ORAL_TABLET | Freq: Every day | ORAL | 0 refills | Status: DC
Start: 2018-05-20 — End: 2024-03-20

## 2018-05-19 MED ORDER — MAGNESIUM SULFATE 2 GM/50ML IV SOLN
2.0000 g | Freq: Once | INTRAVENOUS | Status: AC
Start: 2018-05-19 — End: 2018-05-19
  Administered 2018-05-19: 2 g via INTRAVENOUS
  Filled 2018-05-19: qty 50

## 2018-05-19 MED ORDER — SODIUM CHLORIDE 0.9% IV SOLUTION
Freq: Once | INTRAVENOUS | Status: AC
  Administered 2018-05-19: 16:00:00 via INTRAVENOUS

## 2018-05-19 MED ORDER — HEPARIN SOD (PORK) LOCK FLUSH 100 UNIT/ML IV SOLN
500.0000 [IU] | INTRAVENOUS | Status: AC | PRN
  Administered 2018-05-19: 500 [IU]
  Filled 2018-05-19: qty 5

## 2018-05-19 MED ORDER — ACETAMINOPHEN 325 MG PO TABS: 650.0000 mg | ORAL_TABLET | Freq: Four times a day (QID) | ORAL | Status: DC | PRN

## 2018-05-19 MED ORDER — ALLOPURINOL 300 MG PO TABS: 300.0000 mg | ORAL_TABLET | Freq: Every day | ORAL | 0 refills | Status: DC

## 2018-05-19 NOTE — Discharge Summary (Signed)
Physician Discharge Summary  Deborah Cooley TAV:[id number] DOB: 09-17-1956 DOA: 04/23/2018  PCP: Patient, No Pcp Per  Admit date: 04/23/2018 Discharge date: 05/19/2018  Admitted From: home  Disposition:  home   Recommendations for Outpatient Follow-up:  1. F/u CBC in 1 wk 2. Will go home with Home health     Discharge Condition:  stable  CODE STATUS:  Full code  Consultations:  PCCM  Oncology     Discharge Diagnoses:  Principal Problem:   Angioimmunoblastic lymphoma -   Diffuse lymphadenopathy Active Problems:   Acute respiratory failure (HCC)   Tobacco abuse   Pleural effusion - Status post thoracentesis   Subclinical hypothyroidism   Hyponatremia   Anemia in neoplastic disease   Thrombocytopenia (HCC)   At high risk of tumor lysis syndrome   Encounter for antineoplastic chemotherapy   Lymphadenopathy   Neutropenia, drug-induced ([place])      Brief Summary: Deborah Cooley is a 62 y/o with  Nicotine abuse, b/l pleural effusions and diffuse lymphadenopathy who was last admitted on 5/30 and underwent a thoracentesis of right pleural effusion on 5/31 which showed nonspecific atypical lymphocytosis and then a left axillary excisional LN biopsy on 6/3 and then discharged.   She presented back 2 days later with dyspnea, leg swelling and chest and stomach pain. She was also noted to have a RML opacity and was treated for CAP with Augmentin and Azithromycin for 5 days. Hospital course was complicated by hyponatremia and anasarca.  LN Biopsy results revealed Angioimmunoblastic t-cell lymphoma and she was transferred to [facility] long on 6/18 for port a cath placement and initiation of chemotherapy. 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 Noted to have leakage of serous fluid from biopsy puncture site though to be related to anasarca 6/19 port placed 6/20 received first chemo- [facility] and received 2 [facility]  6/23 underwent  repeat right thoracentesis for therapeutic purposes- 1.7 L removed 6/23 developed VDRF and septic shock from aspiration to RLL- started on Unasyn 6/25 extubated and found to have right subclavian DVT- started on Heparin infusion    Hospital Course:   Aspiration pneumonia with septic shock and VDRF - weaned off pressors and extubated on 6/25 - Augmentin stop date 6/28    Pleural effusions   - s/p right thoracentesis x 2- follow     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 - WBC 0.7>> 0.6 >> 0.6>1.2 > 2.6 with Granix being given daily   - Plt 25 >> 16>> 23> 37> 63  - Heparin infusion was being held    Right subclavian DVT (6/25) - started on heparin infusion - have had to stop Heparin infusion due to acute thrombocytopenia - as platelets have improved to 63, can resume anticoagulation - I will place her on Eliquis  Hypomag/ hypokalemia - replaced  Steroid induced hyperglycemia - due to Decadron which has been discontinued- sugars have since improved- d/c Insluin  Hyponatremia/ anasarca - improving-  placed TEDS   Discharge Exam: Vitals:   05/18/18 2044 05/19/18 0441  BP: (!) 143/60 (!) 136/58  Pulse: 89 97  Resp: 20 (!) 24  Temp: 98.9 F (37.2 C) 98.6 F (37 C)  SpO2: 100% 94%   Vitals:   05/18/18 0507 05/18/18 1320 05/18/18 2044 05/19/18 0441  BP: 132/60 (!) 135/58 (!) 143/60 (!) 136/58  Pulse: 97 97 89 97  Resp: '20 18 20 ' (!) 24  Temp: 98.6 F (37 C) 98.6 F (37 C) 98.9 F (37.2 C) 98.6 F (37 C)  TempSrc: Oral Oral Oral Oral  SpO2: 95% 97% 100% 94%  Weight: 58.5 kg (128 lb 15.5 oz)     Height:        General: Pt is alert, awake, not in acute distress Cardiovascular: RRR, S1/S2 +, no rubs, no  gallops Respiratory: CTA bilaterally, no wheezing, no rhonchi Abdominal: Soft, NT, ND, bowel sounds + Extremities: no edema, no cyanosis   Discharge Instructions  Discharge Instructions    Care order/instruction   Complete by:  As directed    Transfuse Parameters   Complete patient signature process for consent form   Complete by:  As directed    Practitioner attestation of consent   Complete by:  As directed    I, the ordering practitioner, attest that I have discussed with the patient the benefits, risks, side effects, alternatives, likelihood of achieving goals and potential problems during recovery for the procedure listed.   Procedure:  Blood Product(s)   SCHEDULING COMMUNICATION   Complete by:  As directed    Chemotherapy Appointment  - 3 hour    TREATMENT CONDITIONS   Complete by:  As directed    Notify the MD for the following lab values: ANC < 1500, Hemoglobin < 8, PLT < 100,000,  Creatinine > 1.5, urine output < 200 ml prior to cisplatin, Total Bili > 1.5, ALT & AST > 80. If labs are abnormal OR no lab data is available, or if patient has unstable vital signs: Temperature > 38.5, SBP > 180 or < 90, RR > 30 or HR > 100 MD must be notified and order obtained to begin chemotherapy.     Allergies as of 05/19/2018      Reactions   Ambien [zolpidem] Other (See Comments)    Confusion, lightheadedness, dizzy, bad dreams      Medication List    TAKE these medications   acetaminophen 325 MG tablet Commonly known as:  TYLENOL Take 2 tablets (650 mg total) by mouth every 6 (six) hours as needed for mild pain (or Fever >/= 101).   allopurinol 300 MG tablet Commonly known as:  ZYLOPRIM Take 1 tablet (300 mg total) by mouth daily. Start taking on:  05/20/2018   ELIQUIS STARTER PACK 5 MG Tabs Take as directed on package: start with two-62m tablets twice daily for 7 days. On day 8, switch to one-549mtablet twice daily.   multivitamin with minerals Tabs tablet Take 1 tablet by  mouth daily. Start taking on:  05/20/2018   polyethylene glycol packet Commonly known as:  MIRALAX / GLYCOLAX Take 17 g by mouth daily as needed for mild constipation.   predniSONE 20 MG tablet Commonly known as:  DELTASONE Take 3 tablets (60 mg total) by mouth daily. Take on days 1-5 of chemotherapy.      Follow-up Information    [facility]   Why:  at [employer] ID, proof of income so you can be placed on sliding scale, list of medications. Contact information: [phone]      Health, [facility] Follow up.   Specialty:  Home Health Services Why:  for home health services Contact information: [place] [address] [phone]          Allergies  Allergen Reactions  . Ambien [Zolpidem] Other (See Comments)     Confusion, lightheadedness, dizzy, bad dreams     Procedures/Studies:  Venous duplex Right upper extremity venous duplex completed. Right upper extremity is positive for acute deep vein thrombosis involving the right subclavian vein. Pulsatile venous flow is seen, suggestive of possible elevated right-sided heart pressure.    Dg Chest 1 View  Result Date: 05/11/2018 CLINICAL DATA:  62 y/o F; status post intubation. Recent thoracentesis. EXAM: CHEST  1 VIEW COMPARISON:  05/11/2018 chest radiograph. FINDINGS: Stable right port catheter tip projecting over mid SVC. Endotracheal tube tip projects 2 cm above carina. Enteric tube tip extends below field of view into the abdomen. Transcutaneous pacing pads noted. Increased diffuse hazy opacities of the lungs and stable basilar consolidations. No pneumothorax. Bilateral pleural effusions. Bones are unremarkable. No pneumothorax. IMPRESSION: 1. Endotracheal tube tip 2 cm above carina. 2. Enteric tube tip below field of view in the abdomen. 3. Increased diffuse hazy opacities of the lungs and stable basilar  consolidation. Bilateral effusions. Electronically Signed   By: Kristine Garbe M.D.   On: 05/11/2018 13:34   Dg Chest 1 View  Result Date: 05/11/2018 CLINICAL DATA:  Post thoracentesis now with worsening cough, shortness of breath and vomiting. EXAM: CHEST  1 VIEW COMPARISON:  Earlier same day; 05/09/2018; 05/08/2018 FINDINGS: Interval development of recurrent right basilar consolidative suspected airspace opacities now with partial obscuration of the right heart border. Otherwise, grossly unchanged cardiac silhouette and mediastinal contours with atherosclerotic plaque when the thoracic aorta. Unchanged small to moderate size left-sided effusion associated left basilar consolidative opacities. Pulmonary vasculature remains distinct with cephalization of flow. No pneumothorax. Stable position of support apparatus. Known peripherally calcified thyroid nodule overlies the thoracic inlet. No acute osseus abnormalities. IMPRESSION: 1. Interval development of recurrent right basilar consolidative airspace opacities post recent large volume right-sided thoracentesis - differential considerations are broad though given provided history of vomiting, findings are concerning for aspiration with additional differential considerations including re-expansion alveolar pulmonary edema and (less likely) pulmonary hemorrhage. 2. Grossly unchanged small to moderate size left-sided effusion. 3. Persistent findings of pulmonary venous congestion/mild pulmonary edema. Above findings discussed with rapid response team at the time of image acquisition. Electronically Signed   By: Sandi Mariscal M.D.   On: 05/11/2018 11:50   Dg Chest 1 View  Result Date: 05/11/2018 CLINICAL DATA:  Post right-sided thoracentesis EXAM: CHEST  1 VIEW COMPARISON:  05/09/2018 FINDINGS: Interval reduction/near resolution of residual trace right-sided effusion post thoracentesis. No pneumothorax. No change to slight increase in small to moderate  size left-sided effusion. Improved aeration of lung base with persistent right basilar heterogeneous opacities. Unchanged left basilar consolidative opacities. Pulmonary vasculature remains indistinct with cephalization of flow and nodular thickening of the pulmonary interstitium. Grossly unchanged cardiac silhouette and mediastinal contours with atherosclerotic plaque within the thoracic aorta. Calcification overlying the right-sided the thoracic inlet is unchanged and compatible with known peripherally calcified thyroid nodule. Stable position of support apparatus. No acute osseus abnormalities. IMPRESSION: 1. Interval reduction/near resolution of residual trace effusion post thoracentesis. No pneumothorax. 2. No change to slight increase in small to moderate size left-sided pleural effusion. 3. Overall improved aeration of lungs with persistent findings of pulmonary venous congestion/mild pulmonary edema. Electronically Signed   By: Eldridge Abrahams.D.  On: 05/11/2018 10:44   Dg Chest 1 View  Result Date: 05/08/2018 CLINICAL DATA:  Wheezing EXAM: CHEST  1 VIEW COMPARISON:  Chest x-ray of 04/23/2017 FINDINGS: There has been increase in volume of right pleural effusion with increasing right basilar atelectasis. A smaller left pleural effusion remains with probable partial atelectasis at the left lung base as well. The heart is mildly enlarged. A Port-A-Cath is now present with the tip overlying the mid lower SVC. No pneumothorax is seen. No bony abnormality is noted. IMPRESSION: 1. Increase in volume of right pleural effusion with increasing right basilar atelectasis. 2. Small left pleural effusion remains with probable partial atelectasis of the left lung base as well. 3. Port-A-Cath tip overlies the mid lower SVC. Electronically Signed   By: Ivar Drape M.D.   On: 05/08/2018 11:51   Dg Chest 2 View  Result Date: 05/09/2018 CLINICAL DATA:  Short of breath for 2 days. Cough yesterday but not today. Followup  pleural effusion. EXAM: CHEST - 2 VIEW COMPARISON:  07/08/2018 and older exams. FINDINGS: Moderate to large right pleural effusion obscures the right hemidiaphragm and right heart border. Small to moderate left pleural effusion obscures the left hemidiaphragm. There are prominent vascular markings bilaterally without evidence of pulmonary edema. No evidence of pneumonia. No pneumothorax. Cardiac silhouette is normal in size. No mediastinal or hilar masses. Right anterior chest wall Port-A-Cath is stable. IMPRESSION: 1. Moderate to large right and small to moderate left pleural effusions similar to the previous day's study. No evidence of pneumonia or pulmonary edema. Electronically Signed   By: Lajean Manes M.D.   On: 05/09/2018 15:02   Dg Chest 2 View  Result Date: 04/23/2018 CLINICAL DATA:  Recent thoracentesis, worsening shortness of breath would like swelling. Productive cough. History of pleural effusion, pneumonia, left axillary node biopsy April 21, 2018. EXAM: CHEST - 2 VIEW COMPARISON:  Chest radiograph April 19, 2018 and CT chest Apr 18, 2018 FINDINGS: Increasing small bilateral pleural effusions. Diffuse interstitial prominence. Increased lung volumes and flattened hemidiaphragms. Worsening patchy right middle lobe airspace opacity. Left lung base atelectasis. Cardiac silhouette mildly enlarged. Calcified aortic arch. No pneumothorax. Calcified right thyroid nodule. Soft tissue planes and included osseous structures are not suspicious. IMPRESSION: Increasing small bilateral pleural effusions. Worsening right middle lobe airspace opacity with left lung base atelectasis. Mild cardiomegaly.  COPD. Aortic Atherosclerosis (ICD10-I70.0). Electronically Signed   By: Elon Alas M.D.   On: 04/23/2018 21:15   Dg Chest Port 1 View  Result Date: 05/14/2018 CLINICAL DATA:  Pleural effusion EXAM: PORTABLE CHEST 1 VIEW COMPARISON:  05/13/2018 FINDINGS: Cardiac shadow is stable. Endotracheal tube and  nasogastric catheter have been removed in the interval. Right chest wall port is again seen. Thyroid calcifications are noted. Bilateral pleural effusions and basilar infiltrate is seen right greater than left relatively stable from the prior exam. No acute bony abnormality is noted. IMPRESSION: Bibasilar changes right greater than left relatively stable from the prior study. Electronically Signed   By: Inez Catalina M.D.   On: 05/14/2018 08:12   Dg Chest Port 1 View  Result Date: 05/13/2018 CLINICAL DATA:  Follow-up pneumonia EXAM: PORTABLE CHEST 1 VIEW COMPARISON:  05/12/2018 FINDINGS: Endotracheal tube and nasogastric catheter and right-sided chest wall port are again seen and stable. Cardiac shadow is stable. Aortic calcifications are again noted. Right thyroid calcification is noted as well. Persistent bibasilar infiltrates are noted with associated effusions right greater than left. No bony abnormality is noted. IMPRESSION: Persistent bibasilar  changes not significantly improved when compared with the prior exam. Electronically Signed   By: Inez Catalina M.D.   On: 05/13/2018 07:51   Dg Chest Port 1 View  Result Date: 05/12/2018 CLINICAL DATA:  Acute respiratory failure EXAM: PORTABLE CHEST 1 VIEW COMPARISON:  May 11, 2018 FINDINGS: The ETT terminates 16 mm above the carina. Recommend withdrawing 1 cm. The right Port-A-Cath is stable. The NG tube terminates below today's film. Layering effusion with underlying opacity on the left is similar in the interval. Focal infiltrate in the right lower lobe is stable. There may also be pulmonary edema. No other changes. IMPRESSION: 1. The ETT terminates 1.6 cm above the carina. Recommend withdrawing 1 cm. Other support apparatus as above. 2. Effusion and underlying opacity on the left is stable to mildly worsened. Focal infiltrate on the right is stable. Probable superimposed edema. Electronically Signed   By: Dorise Bullion III M.D   On: 05/12/2018 07:32   Ct  Bone Marrow Biopsy & Aspiration  Result Date: 05/05/2018 INDICATION: Lymphoma EXAM: CT BONE MARROW BIOPSY AND ASPIRATION MEDICATIONS: None. ANESTHESIA/SEDATION: Fentanyl 75 mcg IV; Versed 2 mg IV Moderate Sedation Time:  32 minutes The patient was continuously monitored during the procedure by the interventional radiology nurse under my direct supervision. FLUOROSCOPY TIME:  Fluoroscopy Time:  minutes  seconds ( mGy). COMPLICATIONS: None immediate. PROCEDURE: Informed written consent was obtained from the patient after a thorough discussion of the procedural risks, benefits and alternatives. All questions were addressed. Maximal Sterile Barrier Technique was utilized including caps, mask, sterile gowns, sterile gloves, sterile drape, hand hygiene and skin antiseptic. A timeout was performed prior to the initiation of the procedure. Under CT guidance, a(n) 11 gauge guide needle was advanced into the right iliac bone. Aspirates and a core were obtained. Post biopsy images demonstrate . Patient tolerated the procedure well without complication. Vital sign monitoring by nursing staff during the procedure will continue as patient is in the special procedures unit for post procedure observation. FINDINGS: The images document guide needle placement within the right iliac bone. Post biopsy images demonstrate no hemorrhage. IMPRESSION: Successful CT-guided right iliac bone marrow aspirate and core. Electronically Signed   By: Marybelle Killings M.D.   On: 05/05/2018 13:33   Ir Imaging Guided Port Insertion  Result Date: 05/07/2018 INDICATION: 62 year old female with a history of lymphoma. She has been referred for port catheter. EXAM: IMPLANTED PORT A CATH PLACEMENT WITH ULTRASOUND AND FLUOROSCOPIC GUIDANCE MEDICATIONS: 2 g Ancef; The antibiotic was administered within an appropriate time interval prior to skin puncture. ANESTHESIA/SEDATION: Moderate (conscious) sedation was employed during this procedure. A total of Versed  mg and Fentanyl 100 mcg was administered intravenously. Moderate Sedation Time: 20 minutes. The patient's level of consciousness and vital signs were monitored continuously by radiology nursing throughout the procedure under my direct supervision. FLUOROSCOPY TIME:  0 minutes, 6 seconds (1.5 mGy) COMPLICATIONS: None PROCEDURE: The procedure, risks, benefits, and alternatives were explained to the patient. Questions regarding the procedure were encouraged and answered. The patient understands and consents to the procedure. Ultrasound survey was performed with images stored and sent to PACs. The right neck and chest was prepped with chlorhexidine, and draped in the usual sterile fashion using maximum barrier technique (cap and mask, sterile gown, sterile gloves, large sterile sheet, hand hygiene and cutaneous antiseptic). Antibiotic prophylaxis was provided with 2.0g Ancef administered IV one hour prior to skin incision. Local anesthesia was attained by infiltration with 1% lidocaine without epinephrine.  Ultrasound demonstrated patency of the right internal jugular vein, and this was documented with an image. Under real-time ultrasound guidance, this vein was accessed with a 21 gauge micropuncture needle and image documentation was performed. A small dermatotomy was made at the access site with an 11 scalpel. A 0.018" wire was advanced into the SVC and used to estimate the length of the internal catheter. The access needle exchanged for a 41F micropuncture vascular sheath. The 0.018" wire was then removed and a 0.035" wire advanced into the IVC. An appropriate location for the subcutaneous reservoir was selected below the clavicle and an incision was made through the skin and underlying soft tissues. The subcutaneous tissues were then dissected using a combination of blunt and sharp surgical technique and a pocket was formed. A single lumen power injectable portacatheter was then tunneled through the subcutaneous  tissues from the pocket to the dermatotomy and the port reservoir placed within the subcutaneous pocket. The venous access site was then serially dilated and a peel away vascular sheath placed over the wire. The wire was removed and the port catheter advanced into position under fluoroscopic guidance. The catheter tip is positioned in the cavoatrial junction. This was documented with a spot image. The portacatheter was then tested and found to flush and aspirate well. The port was flushed with saline and Huber needle was left in place at the completion. The pocket was then closed in two layers using first subdermal inverted interrupted absorbable sutures followed by a running subcuticular suture. The epidermis was then sealed with Dermabond. The dermatotomy at the venous access site was also seal with Dermabond and Steri-Strips. Patient tolerated the procedure well and remained hemodynamically stable throughout. No complications encountered and no significant blood loss encountered . IMPRESSION: Status post right IJ port catheter placement. Catheter ready for use. Signed, Dulcy Fanny. Dellia Nims, RPVI [facility] Radiology Electronically Signed   By: Corrie Mckusick D.O.   On: 05/07/2018 17:19   US Thoracentesis Asp Pleural Space W/img Guide  Result Date: 05/11/2018 INDICATION: Symptomatic right sided pleural effusion. Please perform ultrasound-guided thoracentesis for therapeutic purposes. EXAM: US THORACENTESIS ASP PLEURAL SPACE W/IMG GUIDE COMPARISON:  Chest CT-04/18/2018; chest radiograph-05/09/2018 MEDICATIONS: None. COMPLICATIONS: None immediate. TECHNIQUE: Informed written consent was obtained from the patient after a discussion of the risks, benefits and alternatives to treatment. A timeout was performed prior to the initiation of the procedure. Initial ultrasound scanning demonstrates a large anechoic right-sided pleural effusion. The lower chest was prepped  and draped in the usual sterile fashion. 1% lidocaine was used for local anesthesia. An ultrasound image was saved for documentation purposes. An 8 Fr Safe-T-Centesis catheter was introduced. The thoracentesis was performed. The catheter was removed and a dressing was applied. The patient tolerated the procedure well without immediate post procedural complication. The patient was escorted to have an upright chest radiograph. FINDINGS: A total of approximately 1.7 liters of serous fluid was removed. IMPRESSION: Successful ultrasound-guided right sided thoracentesis yielding 1.7 liters of pleural fluid. Electronically Signed   By: Sandi Mariscal M.D.   On: 05/11/2018 10:45     The results of significant diagnostics from this hospitalization (including imaging, microbiology, ancillary and laboratory) are listed below for reference.     Microbiology: Recent Results (from the past 240 hour(s))  MRSA PCR Screening     Status: None   Collection Time: 05/11/18  1:45 PM  Result Value Ref Range Status   MRSA by PCR NEGATIVE NEGATIVE Final  Comment:        The GeneXpert MRSA Assay (FDA approved for NASAL specimens only), is one component of a comprehensive MRSA colonization surveillance program. It is not intended to diagnose MRSA infection nor to guide or monitor treatment for MRSA infections. Performed at [facility], [place] [address], [place], [place] [address]   Culture, blood (Routine X 2) w Reflex to ID Panel     Status: None   Collection Time: 05/11/18  2:38 PM  Result Value Ref Range Status   Specimen Description   Final    BLOOD RIGHT ANTECUBITAL Performed at [facility] [address], [place], [place] [address]    Special Requests   Final    BOTTLES DRAWN AEROBIC ONLY Blood Culture adequate volume Performed at [facility] [address] [place], [place] [address]    Culture   Final    NO GROWTH 5 DAYS Performed at [facility], [place] [address], [place], [place] [address]    Report Status 05/16/2018 FINAL  Final  Culture, blood (Routine X 2) w Reflex to ID Panel     Status: None   Collection Time: 05/11/18  2:38 PM  Result Value Ref Range Status   Specimen Description   Final    BLOOD LEFT HAND Performed at [facility], [place] [address], [place], [place] [address]    Special Requests   Final    BOTTLES DRAWN AEROBIC ONLY Blood Culture results may not be optimal due to an inadequate volume of blood received in culture bottles Performed at [facility] [address], [place], [place] [address]    Culture   Final    NO GROWTH 5 DAYS Performed at [facility], [place] [address], [place], [place] [address]    Report Status 05/16/2018 FINAL  Final  Culture, respiratory (NON-Expectorated)     Status: None   Collection Time: 05/11/18  3:11 PM  Result Value Ref Range Status   Specimen Description   Final    TRACHEAL ASPIRATE Performed at [facility] [address] [place], [place] [address]    Special Requests   Final    NONE Performed at [facility], [place] [address] [place], [place] [address]    Gram Stain NO WBC SEEN NO ORGANISMS SEEN   Final   Culture   Final    Consistent with normal respiratory flora. Performed at [facility], [place] [address] [place], [place] [address]    Report Status 05/14/2018 FINAL  Final     Labs: BNP (last 3 results) Recent Labs    04/23/18 2030  BNP 06.2   Basic Metabolic Panel: Recent Labs  Lab 05/12/18 1617 05/13/18 0526 05/13/18 1545  05/15/18 0321 05/16/18 0500 05/17/18 0517 05/18/18 0500 05/19/18 0526  NA  --  143  --    < > 138 136 134* 135 132*  K  --  3.3*  --    < > 3.1* 4.0 3.2* 3.7 3.2*  CL  --  109  --    < > 104 104 102 103 99  CO2  --  31  --    < > 32 '28 26 26 25  ' GLUCOSE  --  150*  --    < > 114* 148* 134* 133* 115*  BUN  --  23  --    < > '14 12 10 ' 8  8   CREATININE  --  0.46  --    < > 0.35* 0.38* 0.40* 0.43* 0.42*  CALCIUM  --  7.9*  --    < > 7.7* 7.5* 7.5* 7.9* 7.8*  MG 1.7 1.8 1.6*  --  1.6* 1.9 1.7 1.6* 1.5*  PHOS 3.0 2.5 2.3*  --   --   --   --   --   --    < > = values in this interval not displayed.   Liver Function Tests: Recent Labs  Lab 05/13/18 0526 05/14/18 0324  AST 19 26  ALT 15 19  ALKPHOS 52 60  BILITOT 0.5 0.7  PROT 5.4* 5.6*  ALBUMIN 2.1* 2.2*   No results for input(s): LIPASE, AMYLASE in the last 168 hours. No results for input(s): AMMONIA in the last 168 hours. CBC: Recent Labs  Lab 05/15/18 0809 05/15/18 1815 05/16/18 0500 05/17/18 0517 05/18/18 0500 05/19/18 0526  WBC 0.7*  --  0.6* 0.6* 1.2* 2.6*  NEUTROABS 0.4  --  0.4 0.4* 0.8* 1.8  HGB 7.5* 9.1* 8.8* 8.3* 8.2* 7.9*  HCT 22.4* 26.6* 26.1* 23.9* 23.9* 22.9*  MCV 86.2  --  84.5 82.7 82.4 82.1  PLT 25*  --  16* 23* 37* 69*   Cardiac Enzymes: No results for input(s): CKTOTAL, CKMB, CKMBINDEX, TROPONINI in the last 168 hours. BNP: Invalid input(s): POCBNP CBG: Recent Labs  Lab 05/14/18 1120 05/14/18 1728 05/14/18 2106 05/15/18 0754 05/15/18 1151  GLUCAP 122* 117* 140* 125* 143*   D-Dimer No results for input(s): DDIMER in the last 72 hours. Hgb A1c No results for input(s): HGBA1C in the last 72 hours. Lipid Profile No results for input(s): CHOL, HDL, LDLCALC, TRIG, CHOLHDL, LDLDIRECT in the last 72 hours. Thyroid function studies No results for input(s): TSH, T4TOTAL, T3FREE, THYROIDAB in the last 72 hours.  Invalid input(s): FREET3 Anemia work up No results for input(s): VITAMINB12, FOLATE, FERRITIN, TIBC, IRON, RETICCTPCT in the last 72 hours. Urinalysis    Component Value Date/Time   COLORURINE AMBER (A) 04/18/2018 0552   APPEARANCEUR HAZY (A) 04/18/2018 0552   LABSPEC 1.020 04/18/2018 0552   PHURINE 5.0 04/18/2018 0552   GLUCOSEU NEGATIVE 04/18/2018 0552   HGBUR NEGATIVE 04/18/2018 0552   BILIRUBINUR NEGATIVE 04/18/2018  0552   KETONESUR 5 (A) 04/18/2018 0552   PROTEINUR 30 (A) 04/18/2018 0552   NITRITE NEGATIVE 04/18/2018 0552   LEUKOCYTESUR NEGATIVE 04/18/2018 0552   Sepsis Labs Invalid input(s): PROCALCITONIN,  WBC,  LACTICIDVEN Microbiology Recent Results (from the past 240 hour(s))  MRSA PCR Screening     Status: None   Collection Time: 05/11/18  1:45 PM  Result Value Ref Range Status   MRSA by PCR NEGATIVE NEGATIVE Final    Comment:        The GeneXpert MRSA Assay (FDA approved for NASAL specimens only), is one component of a comprehensive MRSA colonization surveillance program. It is not intended to diagnose MRSA infection nor to guide or monitor treatment for MRSA infections. Performed at [facility], [place] [address], [place], [place] [address]   Culture, blood (Routine X 2) w Reflex to ID Panel     Status: None   Collection Time: 05/11/18  2:38 PM  Result Value Ref Range Status   Specimen Description   Final    BLOOD RIGHT ANTECUBITAL Performed at [facility] [address] [place], [place] [address]    Special Requests   Final  BOTTLES DRAWN AEROBIC ONLY Blood Culture adequate volume Performed at [facility] [address], [place], [place] [address]    Culture   Final    NO GROWTH 5 DAYS Performed at [facility], [place] [address] [place], [place] [address]    Report Status 05/16/2018 FINAL  Final  Culture, blood (Routine X 2) w Reflex to ID Panel     Status: None   Collection Time: 05/11/18  2:38 PM  Result Value Ref Range Status   Specimen Description   Final    BLOOD LEFT HAND Performed at [facility], [place] [address], [place], [place] [address]    Special Requests   Final    BOTTLES DRAWN AEROBIC ONLY Blood Culture results may not be optimal due to an inadequate volume of blood received in culture bottles Performed at [facility] [address] [place], [place] [address]     Culture   Final    NO GROWTH 5 DAYS Performed at [facility], [place] [address] [place], [place] [address]    Report Status 05/16/2018 FINAL  Final  Culture, respiratory (NON-Expectorated)     Status: None   Collection Time: 05/11/18  3:11 PM  Result Value Ref Range Status   Specimen Description   Final    TRACHEAL ASPIRATE Performed at [facility] [address], [place], [place] [address]    Special Requests   Final    NONE Performed at [facility], [place] [address] [place], [place] [address]    Gram Stain NO WBC SEEN NO ORGANISMS SEEN   Final   Culture   Final    Consistent with normal respiratory flora. Performed at [facility], [place] [address], [place], [place] [address]    Report Status 05/14/2018 FINAL  Final     Time coordinating discharge in minutes: 65  SIGNED:   Debbe Odea, MD  [facility] 05/19/2018, 12:48 PM Pager   If 7PM-7AM, please contact night-coverage [email or web address] Password [email or web address]

## 2018-05-19 NOTE — Progress Notes (Signed)
HEMATOLOGY/ONCOLOGY INPATIENT PROGRESS NOTE  Date of Service: 05/19/2018  Inpatient Attending: .Debbe Odea, MD   SUBJECTIVE:   Deborah Cooley  is accompanied today by two members of her family. The pt reports that she is doing well overall and is currently receiving a blood transfusion for symptomatic anemia.  The pt reports that her breathing difficulty has alleviated greatly and she has been able to get out of bed and walk around. She continues feeling better and also notes that her leg swelling has improved significantly.  Lab results today (05/19/18) of CBC w/diff, BMP is as follows: all values are WNL except for WBC at 2.6k, RBC at 2.79, HGB at 7.9, HCT at 22.9, PLT at 69k, Sodium at 132, Potassium at 3.2, Glucose at 115, Creatinine at 0.42, Calcium at 7.8.  She received a PRBC transfusion today with hgb of 9.8 on discharge.  On review of systems, pt reports resolving leg swelling, moving her bowels well, increased energy levels, and denies fevers, chills, SOB, abdominal pains, and any other symptoms.   OBJECTIVE:  NAD  PHYSICAL EXAMINATION: . Vitals:   05/18/18 1320 05/18/18 2044 05/19/18 0441 05/19/18 1505  BP: (!) 135/58 (!) 143/60 (!) 136/58 (!) 118/38  Pulse: 97 89 97 (!) 106  Resp: 18 20 (!) 24 18  Temp: 98.6 F (37 C) 98.9 F (37.2 C) 98.6 F (37 C) 98.6 F (37 C)  TempSrc: Oral Oral Oral Oral  SpO2: 97% 100% 94% 94%  Weight:      Height:       Filed Weights   05/16/18 0500 05/17/18 0523 05/18/18 0507  Weight: 141 lb 8.6 oz (64.2 kg) 143 lb 11.8 oz (65.2 kg) 128 lb 15.5 oz (58.5 kg)   .Body mass index is 23.73 kg/m.  Marland Kitchen GENERAL:alert, in no acute distress and comfortable SKIN: no acute rashes, no significant lesions EYES: conjunctival pallor +, sclera anicteric OROPHARYNX: MMM, NECK: supple, no JVD LYMPH:  no palpable lymphadenopathy in the cervical, axillary or inguinal regions LUNGS: clear to auscultation b/l with normal respiratory effort, few  scattered rhonci. HEART: regular rate & rhythm ABDOMEN:  normoactive bowel sounds , non tender, not distended. Extremity: no pedal edema PSYCH: alert & oriented x 3 with fluent speech NEURO: no focal motor/sensory deficits    MEDICAL HISTORY:  Past Medical History:  Diagnosis Date  . Bilateral pleural effusion 04/17/2018  . Endometriosis   . Essential hypertension, benign   . Family history of adverse reaction to anesthesia    "sister stops breathing I think" (04/18/2018)  . Heart murmur   . Mixed hyperlipidemia   . Pneumonia 04/17/2018    SURGICAL HISTORY: Past Surgical History:  Procedure Laterality Date  . ABDOMINAL HYSTERECTOMY  1970s/1980s   for endometriosis  . Arthroscopic right shoulder surgery    . AXILLARY LYMPH NODE BIOPSY Left 04/21/2018   Procedure: LEFT AXILLARY LYMPH NODE BIOPSY;  Surgeon: Kieth Brightly, Arta Bruce, MD;  Location: [facility];  Service: General;  Laterality: Left;  . CARPAL TUNNEL RELEASE Right   . GANGLION CYST EXCISION Right   . IR IMAGING GUIDED PORT INSERTION  05/07/2018  . MULTIPLE TOOTH EXTRACTIONS    . OOPHORECTOMY  1997  . PARTIAL HYSTERECTOMY  1984  . Right hand surgery    . SHOULDER OPEN ROTATOR CUFF REPAIR Right   . VESICOVAGINAL FISTULA CLOSURE W/ TAH      SOCIAL HISTORY: Social History   Socioeconomic History  . Marital status: Married    Spouse name:  Not on file  . Number of children: Not on file  . Years of education: Not on file  . Highest education level: Not on file  Occupational History  . Occupation: Social research officer, government    Comment: works at [employer]  . Financial resource strain: Not on file  . Food insecurity:    Worry: Not on file    Inability: Not on file  . Transportation needs:    Medical: Not on file    Non-medical: Not on file  Tobacco Use  . Smoking status: Current Every Day Smoker    Packs/day: 1.50    Years: 46.00    Pack years: 69.00    Types: Cigarettes  . Smokeless tobacco: Never  Used  Substance and Sexual Activity  . Alcohol use: Not Currently    Comment: 04/18/2018 "quit years ago"  . Drug use: Not Currently    Comment: "back in my 55s"  . Sexual activity: Not Currently  Lifestyle  . Physical activity:    Days per week: Not on file    Minutes per session: Not on file  . Stress: Not on file  Relationships  . Social connections:    Talks on phone: Not on file    Gets together: Not on file    Attends religious service: Not on file    Active member of club or organization: Not on file    Attends meetings of clubs or organizations: Not on file    Relationship status: Not on file  . Intimate partner violence:    Fear of current or ex partner: Not on file    Emotionally abused: Not on file    Physically abused: Not on file    Forced sexual activity: Not on file  Other Topics Concern  . Not on file  Social History Narrative   ** Merged History Encounter **       Patient has one son whom is healthy.   Has a live in boyfriend.    FAMILY HISTORY: Family History  Problem Relation Age of Onset  . Diabetes Mother        age 91  . Heart attack Father        died age 30's  . Hypertension Father   . Cancer Sister        breast cancer diagonsed age 32 years  . Diabetes Unknown   . Heart disease Unknown        Female <55  . Arthritis Unknown   . Asthma Unknown   . Hyperlipidemia Other        several siblings  . Thyroid disease Sister        one sister with thyroid disease    ALLERGIES:  is allergic to [place] [zolpidem].  MEDICATIONS:  Scheduled Meds: . sodium chloride   Intravenous Once  . allopurinol  300 mg Oral Daily  . Chlorhexidine Gluconate Cloth  6 each Topical Daily  . mouth rinse  15 mL Mouth Rinse BID  . multivitamin with minerals  1 tablet Oral Daily  . sodium chloride flush  10-40 mL Intracatheter Q12H  . sodium chloride flush  3 mL Intravenous Q12H   Continuous Infusions: . sodium chloride    . sodium chloride Stopped (05/16/18  2321)   PRN Meds:.acetaminophen **OR** acetaminophen, heparin lock flush, heparin lock flush, heparin lock flush, ipratropium-albuterol, polyethylene glycol, prochlorperazine, sodium chloride flush, sodium chloride flush, sodium chloride flush, sodium chloride flush, sodium chloride flush  REVIEW  OF SYSTEMS:    A 10+ POINT REVIEW OF SYSTEMS WAS OBTAINED including neurology, dermatology, psychiatry, cardiac, respiratory, lymph, extremities, GI, GU, Musculoskeletal, constitutional, breasts, reproductive, HEENT.  All pertinent positives are noted in the HPI.  All others are negative.    LABORATORY DATA:  I have reviewed the data as listed  . CBC Latest Ref Rng & Units 05/19/2018 05/18/2018 05/17/2018  WBC 4.0 - 10.5 K/uL 2.6(L) 1.2(LL) 0.6(LL)  Hemoglobin 12.0 - 15.0 g/dL 7.9(L) 8.2(L) 8.3(L)  Hematocrit 36.0 - 46.0 % 22.9(L) 23.9(L) 23.9(L)  Platelets 150 - 400 K/uL 69(L) 37(L) 23(LL)    . CMP Latest Ref Rng & Units 05/19/2018 05/18/2018 05/17/2018  Glucose 70 - 99 mg/dL 115(H) 133(H) 134(H)  BUN 8 - 23 mg/dL _0 Creatinine 0.44 - 1.00 mg/dL 0.42(L) 0.43(L) 0.40(L)  Sodium 135 - 145 mmol/L 132(L) 135 134(L)  Potassium 3.5 - 5.1 mmol/L 3.2(L) 3.7 3.2(L)  Chloride 98 - 111 mmol/L 99 103 102  CO2 22 - 32 mmol/L _1 Calcium 8.9 - 10.3 mg/dL 7.8(L) 7.9(L) 7.5(L)  Total Protein 6.5 - 8.1 g/dL - - -  Total Bilirubin 0.3 - 1.2 mg/dL - - -  Alkaline Phos 38 - 126 U/L - - -  AST 15 - 41 U/L - - -  ALT 0 - 44 U/L - - -    RADIOGRAPHIC STUDIES: I have personally reviewed the radiological images as listed and agreed with the findings in the report.   [place] UE 05/13/2018  Final Interpretation:  Right: Evidence of acute DVT in the subclavian vein. Pulsatile venous flow is suggestive of elevated right sided heart pressure.  Left: No evidence of thrombosis in the subclavian.  Dg Chest 1 View  Result Date: 05/11/2018 CLINICAL DATA:  62 y/o F; status post intubation. Recent  thoracentesis. EXAM: CHEST  1 VIEW COMPARISON:  05/11/2018 chest radiograph. FINDINGS: Stable right port catheter tip projecting over mid SVC. Endotracheal tube tip projects 2 cm above carina. Enteric tube tip extends below field of view into the abdomen. Transcutaneous pacing pads noted. Increased diffuse hazy opacities of the lungs and stable basilar consolidations. No pneumothorax. Bilateral pleural effusions. Bones are unremarkable. No pneumothorax. IMPRESSION: 1. Endotracheal tube tip 2 cm above carina. 2. Enteric tube tip below field of view in the abdomen. 3. Increased diffuse hazy opacities of the lungs and stable basilar consolidation. Bilateral effusions. Electronically Signed   By: Kristine Garbe M.D.   On: 05/11/2018 13:34   Dg Chest 1 View  Result Date: 05/11/2018 CLINICAL DATA:  Post thoracentesis now with worsening cough, shortness of breath and vomiting. EXAM: CHEST  1 VIEW COMPARISON:  Earlier same day; 05/09/2018; 05/08/2018 FINDINGS: Interval development of recurrent right basilar consolidative suspected airspace opacities now with partial obscuration of the right heart border. Otherwise, grossly unchanged cardiac silhouette and mediastinal contours with atherosclerotic plaque when the thoracic aorta. Unchanged small to moderate size left-sided effusion associated left basilar consolidative opacities. Pulmonary vasculature remains distinct with cephalization of flow. No pneumothorax. Stable position of support apparatus. Known peripherally calcified thyroid nodule overlies the thoracic inlet. No acute osseus abnormalities. IMPRESSION: 1. Interval development of recurrent right basilar consolidative airspace opacities post recent large volume right-sided thoracentesis - differential considerations are broad though given provided history of vomiting, findings are concerning for aspiration with additional differential considerations including re-expansion alveolar pulmonary edema and  (less likely) pulmonary hemorrhage. 2. Grossly unchanged small to moderate size left-sided effusion. 3. Persistent findings of  pulmonary venous congestion/mild pulmonary edema. Above findings discussed with rapid response team at the time of image acquisition. Electronically Signed   By: Sandi Mariscal M.D.   On: 05/11/2018 11:50   Dg Chest 1 View  Result Date: 05/11/2018 CLINICAL DATA:  Post right-sided thoracentesis EXAM: CHEST  1 VIEW COMPARISON:  05/09/2018 FINDINGS: Interval reduction/near resolution of residual trace right-sided effusion post thoracentesis. No pneumothorax. No change to slight increase in small to moderate size left-sided effusion. Improved aeration of lung base with persistent right basilar heterogeneous opacities. Unchanged left basilar consolidative opacities. Pulmonary vasculature remains indistinct with cephalization of flow and nodular thickening of the pulmonary interstitium. Grossly unchanged cardiac silhouette and mediastinal contours with atherosclerotic plaque within the thoracic aorta. Calcification overlying the right-sided the thoracic inlet is unchanged and compatible with known peripherally calcified thyroid nodule. Stable position of support apparatus. No acute osseus abnormalities. IMPRESSION: 1. Interval reduction/near resolution of residual trace effusion post thoracentesis. No pneumothorax. 2. No change to slight increase in small to moderate size left-sided pleural effusion. 3. Overall improved aeration of lungs with persistent findings of pulmonary venous congestion/mild pulmonary edema. Electronically Signed   By: Sandi Mariscal M.D.   On: 05/11/2018 10:44   Dg Chest 1 View  Result Date: 05/08/2018 CLINICAL DATA:  Wheezing EXAM: CHEST  1 VIEW COMPARISON:  Chest x-ray of 04/23/2017 FINDINGS: There has been increase in volume of right pleural effusion with increasing right basilar atelectasis. A smaller left pleural effusion remains with probable partial atelectasis at  the left lung base as well. The heart is mildly enlarged. A Port-A-Cath is now present with the tip overlying the mid lower SVC. No pneumothorax is seen. No bony abnormality is noted. IMPRESSION: 1. Increase in volume of right pleural effusion with increasing right basilar atelectasis. 2. Small left pleural effusion remains with probable partial atelectasis of the left lung base as well. 3. Port-A-Cath tip overlies the mid lower SVC. Electronically Signed   By: Ivar Drape M.D.   On: 05/08/2018 11:51   Dg Chest 2 View  Result Date: 05/09/2018 CLINICAL DATA:  Short of breath for 2 days. Cough yesterday but not today. Followup pleural effusion. EXAM: CHEST - 2 VIEW COMPARISON:  07/08/2018 and older exams. FINDINGS: Moderate to large right pleural effusion obscures the right hemidiaphragm and right heart border. Small to moderate left pleural effusion obscures the left hemidiaphragm. There are prominent vascular markings bilaterally without evidence of pulmonary edema. No evidence of pneumonia. No pneumothorax. Cardiac silhouette is normal in size. No mediastinal or hilar masses. Right anterior chest wall Port-A-Cath is stable. IMPRESSION: 1. Moderate to large right and small to moderate left pleural effusions similar to the previous day's study. No evidence of pneumonia or pulmonary edema. Electronically Signed   By: Lajean Manes M.D.   On: 05/09/2018 15:02   Dg Chest 2 View  Result Date: 04/23/2018 CLINICAL DATA:  Recent thoracentesis, worsening shortness of breath would like swelling. Productive cough. History of pleural effusion, pneumonia, left axillary node biopsy April 21, 2018. EXAM: CHEST - 2 VIEW COMPARISON:  Chest radiograph April 19, 2018 and CT chest Apr 18, 2018 FINDINGS: Increasing small bilateral pleural effusions. Diffuse interstitial prominence. Increased lung volumes and flattened hemidiaphragms. Worsening patchy right middle lobe airspace opacity. Left lung base atelectasis. Cardiac silhouette  mildly enlarged. Calcified aortic arch. No pneumothorax. Calcified right thyroid nodule. Soft tissue planes and included osseous structures are not suspicious. IMPRESSION: Increasing small bilateral pleural effusions. Worsening right middle lobe airspace  opacity with left lung base atelectasis. Mild cardiomegaly.  COPD. Aortic Atherosclerosis (ICD10-I70.0). Electronically Signed   By: Elon Alas M.D.   On: 04/23/2018 21:15   Dg Chest Port 1 View  Result Date: 05/14/2018 CLINICAL DATA:  Pleural effusion EXAM: PORTABLE CHEST 1 VIEW COMPARISON:  05/13/2018 FINDINGS: Cardiac shadow is stable. Endotracheal tube and nasogastric catheter have been removed in the interval. Right chest wall port is again seen. Thyroid calcifications are noted. Bilateral pleural effusions and basilar infiltrate is seen right greater than left relatively stable from the prior exam. No acute bony abnormality is noted. IMPRESSION: Bibasilar changes right greater than left relatively stable from the prior study. Electronically Signed   By: Inez Catalina M.D.   On: 05/14/2018 08:12   Dg Chest Port 1 View  Result Date: 05/13/2018 CLINICAL DATA:  Follow-up pneumonia EXAM: PORTABLE CHEST 1 VIEW COMPARISON:  05/12/2018 FINDINGS: Endotracheal tube and nasogastric catheter and right-sided chest wall port are again seen and stable. Cardiac shadow is stable. Aortic calcifications are again noted. Right thyroid calcification is noted as well. Persistent bibasilar infiltrates are noted with associated effusions right greater than left. No bony abnormality is noted. IMPRESSION: Persistent bibasilar changes not significantly improved when compared with the prior exam. Electronically Signed   By: Inez Catalina M.D.   On: 05/13/2018 07:51   Dg Chest Port 1 View  Result Date: 05/12/2018 CLINICAL DATA:  Acute respiratory failure EXAM: PORTABLE CHEST 1 VIEW COMPARISON:  May 11, 2018 FINDINGS: The ETT terminates 16 mm above the carina. Recommend  withdrawing 1 cm. The right Port-A-Cath is stable. The NG tube terminates below today's film. Layering effusion with underlying opacity on the left is similar in the interval. Focal infiltrate in the right lower lobe is stable. There may also be pulmonary edema. No other changes. IMPRESSION: 1. The ETT terminates 1.6 cm above the carina. Recommend withdrawing 1 cm. Other support apparatus as above. 2. Effusion and underlying opacity on the left is stable to mildly worsened. Focal infiltrate on the right is stable. Probable superimposed edema. Electronically Signed   By: Dorise Bullion III M.D   On: 05/12/2018 07:32   Ct Bone Marrow Biopsy & Aspiration  Result Date: 05/05/2018 INDICATION: Lymphoma EXAM: CT BONE MARROW BIOPSY AND ASPIRATION MEDICATIONS: None. ANESTHESIA/SEDATION: Fentanyl 75 mcg IV; Versed 2 mg IV Moderate Sedation Time:  32 minutes The patient was continuously monitored during the procedure by the interventional radiology nurse under my direct supervision. FLUOROSCOPY TIME:  Fluoroscopy Time:  minutes  seconds ( mGy). COMPLICATIONS: None immediate. PROCEDURE: Informed written consent was obtained from the patient after a thorough discussion of the procedural risks, benefits and alternatives. All questions were addressed. Maximal Sterile Barrier Technique was utilized including caps, mask, sterile gowns, sterile gloves, sterile drape, hand hygiene and skin antiseptic. A timeout was performed prior to the initiation of the procedure. Under CT guidance, a(n) 11 gauge guide needle was advanced into the right iliac bone. Aspirates and a core were obtained. Post biopsy images demonstrate . Patient tolerated the procedure well without complication. Vital sign monitoring by nursing staff during the procedure will continue as patient is in the special procedures unit for post procedure observation. FINDINGS: The images document guide needle placement within the right iliac bone. Post biopsy images  demonstrate no hemorrhage. IMPRESSION: Successful CT-guided right iliac bone marrow aspirate and core. Electronically Signed   By: Marybelle Killings M.D.   On: 05/05/2018 13:33   Ir Imaging Guided Port Insertion  Result Date: 05/07/2018 INDICATION: 62 year old female with a history of lymphoma. She has been referred for port catheter. EXAM: IMPLANTED PORT A CATH PLACEMENT WITH ULTRASOUND AND FLUOROSCOPIC GUIDANCE MEDICATIONS: 2 g Ancef; The antibiotic was administered within an appropriate time interval prior to skin puncture. ANESTHESIA/SEDATION: Moderate (conscious) sedation was employed during this procedure. A total of Versed mg and Fentanyl 100 mcg was administered intravenously. Moderate Sedation Time: 20 minutes. The patient's level of consciousness and vital signs were monitored continuously by radiology nursing throughout the procedure under my direct supervision. FLUOROSCOPY TIME:  0 minutes, 6 seconds (1.5 mGy) COMPLICATIONS: None PROCEDURE: The procedure, risks, benefits, and alternatives were explained to the patient. Questions regarding the procedure were encouraged and answered. The patient understands and consents to the procedure. Ultrasound survey was performed with images stored and sent to PACs. The right neck and chest was prepped with chlorhexidine, and draped in the usual sterile fashion using maximum barrier technique (cap and mask, sterile gown, sterile gloves, large sterile sheet, hand hygiene and cutaneous antiseptic). Antibiotic prophylaxis was provided with 2.0g Ancef administered IV one hour prior to skin incision. Local anesthesia was attained by infiltration with 1% lidocaine without epinephrine. Ultrasound demonstrated patency of the right internal jugular vein, and this was documented with an image. Under real-time ultrasound guidance, this vein was accessed with a 21 gauge micropuncture needle and image documentation was performed. A small dermatotomy was made at the access site  with an 11 scalpel. A 0.018" wire was advanced into the SVC and used to estimate the length of the internal catheter. The access needle exchanged for a 33F micropuncture vascular sheath. The 0.018" wire was then removed and a 0.035" wire advanced into the IVC. An appropriate location for the subcutaneous reservoir was selected below the clavicle and an incision was made through the skin and underlying soft tissues. The subcutaneous tissues were then dissected using a combination of blunt and sharp surgical technique and a pocket was formed. A single lumen power injectable portacatheter was then tunneled through the subcutaneous tissues from the pocket to the dermatotomy and the port reservoir placed within the subcutaneous pocket. The venous access site was then serially dilated and a peel away vascular sheath placed over the wire. The wire was removed and the port catheter advanced into position under fluoroscopic guidance. The catheter tip is positioned in the cavoatrial junction. This was documented with a spot image. The portacatheter was then tested and found to flush and aspirate well. The port was flushed with saline and Huber needle was left in place at the completion. The pocket was then closed in two layers using first subdermal inverted interrupted absorbable sutures followed by a running subcuticular suture. The epidermis was then sealed with Dermabond. The dermatotomy at the venous access site was also seal with Dermabond and Steri-Strips. Patient tolerated the procedure well and remained hemodynamically stable throughout. No complications encountered and no significant blood loss encountered . IMPRESSION: Status post right IJ port catheter placement. Catheter ready for use. Signed, Dulcy Fanny. Dellia Nims, RPVI [facility] Electronically Signed   By: Corrie Mckusick D.O.   On: 05/07/2018 17:19   US Thoracentesis Asp Pleural Space W/img  Guide  Result Date: 05/11/2018 INDICATION: Symptomatic right sided pleural effusion. Please perform ultrasound-guided thoracentesis for therapeutic purposes. EXAM: US THORACENTESIS ASP PLEURAL SPACE W/IMG GUIDE COMPARISON:  Chest CT-04/18/2018; chest radiograph-05/09/2018 MEDICATIONS: None. COMPLICATIONS: None immediate. TECHNIQUE: Informed written consent was obtained from the patient after  a discussion of the risks, benefits and alternatives to treatment. A timeout was performed prior to the initiation of the procedure. Initial ultrasound scanning demonstrates a large anechoic right-sided pleural effusion. The lower chest was prepped and draped in the usual sterile fashion. 1% lidocaine was used for local anesthesia. An ultrasound image was saved for documentation purposes. An 8 Fr Safe-T-Centesis catheter was introduced. The thoracentesis was performed. The catheter was removed and a dressing was applied. The patient tolerated the procedure well without immediate post procedural complication. The patient was escorted to have an upright chest radiograph. FINDINGS: A total of approximately 1.7 liters of serous fluid was removed. IMPRESSION: Successful ultrasound-guided right sided thoracentesis yielding 1.7 liters of pleural fluid. Electronically Signed   By: Sandi Mariscal M.D.   On: 05/11/2018 10:45    ASSESSMENT & PLAN:  62 y.o. 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 S/P c1 OF [facility] on 05/08/2018  2. General LNadenopathy from lymphoma with b/l pleural effusion and b/l lower extremity weakness  3. Anemia/thrombocytopenia -  from Bone marrow involvement by lymphoma BM Bx confirms involved by T cell lymphoma. Counts improving after nadir. Neutropenia resolved Hgb 9.8 on discharge Platelets 69k  4. 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 LDH wnl no evidnece of overt hemolysis at this timne   5. CMV IgM+ --   Component     Latest Ref Rng & Units 05/09/2018  CMV Quant DNA PCR (Plasma)     Negative IU/mL 8,430  Log10 CMV Qn DNA Pl     log10 IU/mL 3.926    6. High risk for TLS - on allopurinol , received Rasburicase. Uric acid normalized Uric acid 16--->8--> 4.6-->3.4  7. Port a cath in situ  8. 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 Acute hypoxic respiratory failure - required intubation --now extubated and with minimal oxygen needs.  ?etiology Re-expansion pulmonary edema post large volume thoracentesis vs aspiration Though the rapidity of presentation is unusual would need to consider HCAP and CMV pneumonitis (CMV IgM+ and PCV positive with worsening). Resolved hypoxia - treated with antibiotics for aspiration pneumonia.  10 . RUE DVT - US 6/25 -- was briefly on IV Heparin but held given thrombocytopenia PLAN: -appreciate hospitalist/CCM input --advancing diet -diuretics per fluid status -rpt CMV PCR quantitative to evaluate for increasing titers as outpatient. -continued granix 438mg  daily till AYorkshire>1000 on 2 occasions - neutropenia now resolved -transfuse PRBC prn for hgb <8 -transfuse platelets for bleeding or prophylactically if <20k (in setting of infection/sepsis) -Discussed pt labwork today, 05/19/18; HGB at 7.9, PLT at 69k -may restart anticoagulation for DVT with platelets>50k -patient will be discharged home today with home health services and home PT/OT -Will see pt back on 05/27/2018 and 2nd cycle of Brentuximab-CHP on 05/28/2018 with G-CSF support -Recommend that the pt continue optimizing nutritional status and activity levels   . The total time spent in the appointment was 35 minutes and more than 50% was on counseling and direct patient cares co-ordination of cares with hospitalist and outpatient cancer team  GSullivan LoneMD MHaworthAAHIVMS SSheperd Hill HospitalCPalisades Medical CenterHematology/Oncology Physician CLovel[facility] (Office):       [phone](Work cell):  [phone](Fax):           [phone] 05/19/2018 4:23 PM   I, SBaldwin Jamaica am acting as a sEducation administrator  for Dr Irene Limbo.   I have reviewed the above documentation for accuracy and completeness, and I agree with the above. Sullivan Lone MD MS

## 2018-05-19 NOTE — Progress Notes (Signed)
Patient discharged to car with husband via wheelchair. Vitals are stable and the patient is free of pain.  All questions were answered.

## 2018-05-19 NOTE — Progress Notes (Signed)
Physical Therapy Treatment Patient Details Name: Deborah Cooley MRN: [id number] DOB: [birth year] Today's Date: 05/19/2018    History of Present Illness Pt is a 62 y.o. F with significant PMH of HTN, endometriosis s/p hysterectomy, tobaccos use and recent hospitalization for multifocal lymphadenopathy and concern for lymphoma who presents for evaluation of recurrent shortness of breath and abdominal pain. Transfer to ICU with VDRF 05/11/18. Extubated 05/13/18.     PT Comments    The patient  Ambulated x 200'. Reports to get blood prior to DC. HR 105-122, sats 97%   Follow Up Recommendations  Home health PT;Supervision for mobility/OOB     Equipment Recommendations  None recommended by PT    Recommendations for Other Services       Precautions / Restrictions Precautions Precautions: Fall    Mobility  Bed Mobility Overal bed mobility: Modified Independent                Transfers Overall transfer level: Needs assistance Equipment used: None Transfers: Sit to/from Stand Sit to Stand: Supervision            Ambulation/Gait Ambulation/Gait assistance: Supervision Gait Distance (Feet): 100 Feet Assistive device: (at times uses rail)       General Gait Details: steady assist. HR 122-105. sats 97% on RA   Stairs             Wheelchair Mobility    Modified Rankin (Stroke Patients Only)       Balance   Sitting-balance support: No upper extremity supported;Feet supported Sitting balance-Leahy Scale: Good     Standing balance support: No upper extremity supported;During functional activity Standing balance-Leahy Scale: Fair                              Cognition Arousal/Alertness: Awake/alert                                            Exercises      General Comments        Pertinent Vitals/Pain Faces Pain Scale: Hurts even more Pain Location: hemorrhoids Pain Descriptors / Indicators:  Grimacing;Moaning Pain Intervention(s): Monitored during session    Home Living                      Prior Function            PT Goals (current goals can now be found in the care plan section) Progress towards PT goals: Progressing toward goals    Frequency    Min 3X/week      PT Plan Current plan remains appropriate    Co-evaluation              AM-PAC PT "6 Clicks" Daily Activity  Outcome Measure  Difficulty turning over in bed (including adjusting bedclothes, sheets and blankets)?: None Difficulty moving from lying on back to sitting on the side of the bed? : None Difficulty sitting down on and standing up from a chair with arms (e.g., wheelchair, bedside commode, etc,.)?: A Little Help needed moving to and from a bed to chair (including a wheelchair)?: A Little   Help needed climbing 3-5 steps with a railing? : A Lot 6 Click Score: 16    End of Session   Activity Tolerance: Patient tolerated treatment well Patient left: in bed;with call  bell/phone within reach Nurse Communication: Mobility status PT Visit Diagnosis: Unsteadiness on feet (R26.81);Difficulty in walking, not elsewhere classified (R26.2)     Time: 6606-0045 PT Time Calculation (min) (ACUTE ONLY): 19 min  Charges:  $Gait Training: 8-22 mins                    G CodesTresa Endo PT [phone]    Claretha Cooper 05/19/2018, 3:16 PM

## 2018-05-21 ENCOUNTER — Telehealth: Payer: Self-pay

## 2018-05-21 ENCOUNTER — Encounter: Payer: Self-pay | Admitting: Hematology

## 2018-05-21 ENCOUNTER — Encounter: Payer: Self-pay | Admitting: Pharmacist

## 2018-05-21 NOTE — Telephone Encounter (Signed)
Received a call from [facility] at [facility] care requesting an order for plan of care from Dr. Irene Limbo. Dr. Irene Limbo gave a verbal order. [name] also stated that patient was discharged from the hospital with a prescription for Eliquis but can not afford the 500.00+ copay. Spoke to patiient financial advocate and she relayed information for patient to call [facility] [employer]. Also spoke to Jesse=pharmacist here at the [facility] and they are able to give the patient a 3 week supply to get started. Maria with [facility] is going to relay the information to the patient and her family. Per [name], patient's family will pick up Eliquis at the [facility] on Friday 05/23/18 and contact patient assistance foundation at [employer].

## 2018-05-21 NOTE — Telephone Encounter (Signed)
Eliquis starter pack given to Roz, RN in triage to lock up. [name]- Nurse with [facility] informed to let patient/family know that when they come into the [facility] on Friday to let the receptionist know that they are here to pick up a prescription from the triage nurse.

## 2018-05-21 NOTE — Progress Notes (Signed)
Oral Chemotherapy Pharmacist Encounter  Dispensed samples to patient:  Medication: Eliquis (apixaban) 5mg  tablets Instructions: Take 2 tablets (10mg ) by mouth 2 times daily for 7 days, then take 1 tablet (5mg ) by mouth 2 times daily. Quantity dispensed: 56 Days supply: 21 Manufacturer: [place] Lot: [id number]  Exp: March 2021  Samples delivered to Dr. Grier Mitts pod for distribution to patient's family.  Johny Drilling, PharmD, BCPS, BCOP 05/21/2018 3:21 PM Oral Oncology [facility] [phone]

## 2018-05-23 LAB — TYPE AND SCREEN
ABO/RH(D): O POS
Antibody Screen: POSITIVE
DAT, IgG: POSITIVE
UNIT DIVISION: 0
UNIT DIVISION: 0

## 2018-05-23 LAB — BPAM RBC
BLOOD PRODUCT EXPIRATION DATE: 201907292359
Blood Product Expiration Date: 201907292359
ISSUE DATE / TIME: 201907011607
UNIT TYPE AND RH: 5100
Unit Type and Rh: 5100

## 2018-05-23 NOTE — Progress Notes (Signed)
HEMATOLOGY/ONCOLOGY CLINIC NOTE  Date of Service: 05/27/2018  Patient Care Team: Patient, No Pcp Per as PCP - General ([place]) [name], MD (Unknown Physician Specialty)  CHIEF COMPLAINTS/PURPOSE OF CONSULTATION:  Newly diagnosed angioimmunoblastic T cell lymphoma  HISTORY OF PRESENTING ILLNESS:   Deborah Cooley is a wonderful 62 y.o. female who has been referred to [place] by Dr Joni Reining for evaluation and management of her newly diagnosed angioimmunoblastic T cell lymphoma.   Patient has a h/o HTN, endometriosis, tobacco use who was admitted to the hospital with shortness of breath and was subsequently found to have diffuse lymphadenopathy. She was also found to have bilateral pleural effusions and has had a thoracentesis with pleural fluid demonstrating atypical lymphocytosis. She was discharged but returned to the ED with complaints of persisting SOB, abdominal swelling, and lower extremity swelling.   She has had a  CTA chest and CT abd/pelvis which showed no PE.but extensive LNadenopathy in the chest and throughout the abd with b/l pleural effusion and mild ascites. Also noted to have narrowing in b/l common iliac arteries.  Clinically she is noted to have b/l LE edema 1-2+  Most recent lab results (05/05/18) of CBC  is as follows: all values are WNL except for RBC at 2.59, HGB at 7.7, HCT at 22.7, RDW at 16.0, PLT at 89k.  She has had a CT bone marrow biopsy on 05/05/2018 -results pending. ECHO was done and showed  Systolic function was   normal. The estimated ejection fraction was in the range of 60%   to 65%. Wall motion was normal; there were no regional wall   motion abnormalities. Left ventricular diastolic function   parameters were normal.  Port a cath placement requested.  Patient was transferred to [facility] with her consent to receive C1 of chemotherapy as inpatient.  Interval History:   Larosa Rhines returns today regarding her  Angioimmunoblastic T cell lymphoma. I last saw the pt as an inpatient on 05/19/18.    The pt reports she is doing well and was taken off oxygen on discharge. No shortness of breath. No chest pain. Leg swelling improving.  Lab results today (05/27/18) of CBC w/diff, CMP is as follows: all values are WNL except for RBC at 3.00, HGB at 8.2, HCT at 24.1, RDW at 15.2, Sodium at 134, Potassium at 3.0, Chloride at 97, Glucose at 216 Albumin at 2.9. Magnesium 05/27/18 is normal at 1.7 Phosphorous 05/27/18 is normal at 3.2 LDH 05/27/18 is elevated at 322 Uric acid 05/27/18 is normal at 3.0  MEDICAL HISTORY:  Past Medical History:  Diagnosis Date  . Bilateral pleural effusion 04/17/2018  . Endometriosis   . Essential hypertension, benign   . Family history of adverse reaction to anesthesia    "sister stops breathing I think" (04/18/2018)  . Heart murmur   . Mixed hyperlipidemia   . Pneumonia 04/17/2018    SURGICAL HISTORY: Past Surgical History:  Procedure Laterality Date  . ABDOMINAL HYSTERECTOMY  1970s/1980s   for endometriosis  . Arthroscopic right shoulder surgery    . AXILLARY LYMPH NODE BIOPSY Left 04/21/2018   Procedure: LEFT AXILLARY LYMPH NODE BIOPSY;  Surgeon: Kieth Brightly, Arta Bruce, MD;  Location: [facility];  Service: General;  Laterality: Left;  . CARPAL TUNNEL RELEASE Right   . GANGLION CYST EXCISION Right   . IR IMAGING GUIDED PORT INSERTION  05/07/2018  . MULTIPLE TOOTH EXTRACTIONS    . OOPHORECTOMY  1997  . PARTIAL HYSTERECTOMY  1984  .  Right hand surgery    . SHOULDER OPEN ROTATOR CUFF REPAIR Right   . VESICOVAGINAL FISTULA CLOSURE W/ TAH      SOCIAL HISTORY: Social History   Socioeconomic History  . Marital status: Married    Spouse name: Not on file  . Number of children: Not on file  . Years of education: Not on file  . Highest education level: Not on file  Occupational History  . Occupation: Social research officer, government    Comment: works at [employer]  .  Financial resource strain: Not on file  . Food insecurity:    Worry: Not on file    Inability: Not on file  . Transportation needs:    Medical: Not on file    Non-medical: Not on file  Tobacco Use  . Smoking status: Current Every Day Smoker    Packs/day: 1.50    Years: 46.00    Pack years: 69.00    Types: Cigarettes  . Smokeless tobacco: Never Used  Substance and Sexual Activity  . Alcohol use: Not Currently    Comment: 04/18/2018 "quit years ago"  . Drug use: Not Currently    Comment: "back in my 9s"  . Sexual activity: Not Currently  Lifestyle  . Physical activity:    Days per week: Not on file    Minutes per session: Not on file  . Stress: Not on file  Relationships  . Social connections:    Talks on phone: Not on file    Gets together: Not on file    Attends religious service: Not on file    Active member of club or organization: Not on file    Attends meetings of clubs or organizations: Not on file    Relationship status: Not on file  . Intimate partner violence:    Fear of current or ex partner: Not on file    Emotionally abused: Not on file    Physically abused: Not on file    Forced sexual activity: Not on file  Other Topics Concern  . Not on file  Social History Narrative   ** Merged History Encounter **       Patient has one son whom is healthy.   Has a live in boyfriend.    FAMILY HISTORY: Family History  Problem Relation Age of Onset  . Diabetes Mother        age 4  . Heart attack Father        died age 20's  . Hypertension Father   . Cancer Sister        breast cancer diagonsed age 40 years  . Diabetes Unknown   . Heart disease Unknown        Female <55  . Arthritis Unknown   . Asthma Unknown   . Hyperlipidemia Other        several siblings  . Thyroid disease Sister        one sister with thyroid disease    ALLERGIES:  is allergic to [place] [zolpidem].  MEDICATIONS:  Current Outpatient Medications  Medication Sig Dispense Refill  .  acetaminophen (TYLENOL) 325 MG tablet Take 2 tablets (650 mg total) by mouth every 6 (six) hours as needed for mild pain (or Fever >/= 101).    Marland Kitchen allopurinol (ZYLOPRIM) 300 MG tablet Take 1 tablet (300 mg total) by mouth daily. 30 tablet 0  . ELIQUIS STARTER PACK (ELIQUIS STARTER PACK) 5 MG TABS Take as directed on package: start with  two-5m tablets twice daily for 7 days. On day 8, switch to one-536mtablet twice daily. 1 each 0  . Multiple Vitamin (MULTIVITAMIN WITH MINERALS) TABS tablet Take 1 tablet by mouth daily. 30 tablet 0  . polyethylene glycol (MIRALAX / GLYCOLAX) packet Take 17 g by mouth daily as needed for mild constipation. 30 each 0  . predniSONE (DELTASONE) 20 MG tablet Take 3 tablets (60 mg total) by mouth daily. Take on days 1-5 of chemotherapy. (Patient taking differently: Take 10 mg by mouth daily. Take on days 1-5 of chemotherapy.) 15 tablet 6   No current facility-administered medications for this visit.     REVIEW OF SYSTEMS:    A 10+ POINT REVIEW OF SYSTEMS WAS OBTAINED including neurology, dermatology, psychiatry, cardiac, respiratory, lymph, extremities, GI, GU, Musculoskeletal, constitutional, breasts, reproductive, HEENT.  All pertinent positives are noted in the HPI.  All others are negative.   PHYSICAL EXAMINATION: ECOG PERFORMANCE STATUS: 2 - Symptomatic, <50% confined to bed  . Vitals:   05/27/18 1440  BP: (!) 111/48  Pulse: (!) 102  Resp: 17  Temp: 98.5 F (36.9 C)  SpO2: 98%   Filed Weights   05/27/18 1440  Weight: 111 lb 4.8 oz (50.5 kg)   .Body mass index is 20.48 kg/m.  GENERAL:alert, in no acute distress and comfortable SKIN: no acute rashes, no significant lesions EYES: conjunctival pallor +, sclera anicteric OROPHARYNX: MMM, no exudates, no oropharyngeal erythema or ulceration NECK: supple, no JVD LYMPH:  no palpable lymphadenopathy in the cervical, axillary or inguinal regions LUNGS: clear to auscultation b/l with normal respiratory  effort, few scattered rhonchi  HEART: regular rate & rhythm ABDOMEN:  normoactive bowel sounds , non tender, not distended. No palpable hepatosplenomegaly.  Extremity: 2+ bilateral pedal edema (improving) PSYCH: alert & oriented x 3 with fluent speech NEURO: no focal motor/sensory deficits   LABORATORY DATA:  I have reviewed the data as listed  . CBC Latest Ref Rng & Units 05/27/2018 05/19/2018 05/19/2018  WBC 3.9 - 10.3 K/uL 5.8 - 2.6(L)  Hemoglobin 11.6 - 15.9 g/dL 8.2(L) 9.8(L) 7.9(L)  Hematocrit 34.8 - 46.6 % 24.1(L) 28.2(L) 22.9(L)  Platelets 145 - 400 K/uL 233 - 69(L)   . CBC    Component Value Date/Time   WBC 5.8 05/27/2018 1317   RBC 3.00 (L) 05/27/2018 1317   HGB 8.2 (L) 05/27/2018 1317   HCT 24.1 (L) 05/27/2018 1317   PLT 233 05/27/2018 1317   MCV 80.3 05/27/2018 1317   MCH 27.3 05/27/2018 1317   MCHC 34.0 05/27/2018 1317   RDW 15.2 (H) 05/27/2018 1317   LYMPHSABS 1.6 05/27/2018 1317   MONOABS 0.7 05/27/2018 1317   EOSABS 0.0 05/27/2018 1317   BASOSABS 0.1 05/27/2018 1317    . CMP Latest Ref Rng & Units 05/27/2018 05/19/2018 05/18/2018  Glucose 70 - 99 mg/dL 126(H) 115(H) 133(H)  BUN 8 - 23 mg/dL _0 Creatinine 0.44 - 1.00 mg/dL 0.62 0.42(L) 0.43(L)  Sodium 135 - 145 mmol/L 134(L) 132(L) 135  Potassium 3.5 - 5.1 mmol/L 3.0(LL) 3.2(L) 3.7  Chloride 98 - 111 mmol/L 97(L) 99 103  CO2 22 - 32 mmol/L _1 Calcium 8.9 - 10.3 mg/dL 9.4 7.8(L) 7.9(L)  Total Protein 6.5 - 8.1 g/dL 6.7 - -  Total Bilirubin 0.3 - 1.2 mg/dL 0.9 - -  Alkaline Phos 38 - 126 U/L 64 - -  AST 15 - 41 U/L 24 - -  ALT 0 - 44 U/L  20 - -    . Lab Results  Component Value Date   LDH 322 (H) 05/27/2018    Component     Latest Ref Rng & Units 04/18/2018 05/03/2018  Hepatitis B Surface Ag     Negative  Negative  HCV Ab     0.0 - 0.9 s/co ratio  <0.1  Hep A Ab, IgM     Negative  Negative  Hep B Core Ab, IgM     Negative  Negative  HIV Screen 4th Generation wRfx     Non Reactive Non  Reactive     04/21/18 Molecular Pathology:   04/21/18 Surgical Pathology:    RADIOGRAPHIC STUDIES: I have personally reviewed the radiological images as listed and agreed with the findings in the report. Dg Chest 1 View  Result Date: 05/11/2018 CLINICAL DATA:  62 y/o F; status post intubation. Recent thoracentesis. EXAM: CHEST  1 VIEW COMPARISON:  05/11/2018 chest radiograph. FINDINGS: Stable right port catheter tip projecting over mid SVC. Endotracheal tube tip projects 2 cm above carina. Enteric tube tip extends below field of view into the abdomen. Transcutaneous pacing pads noted. Increased diffuse hazy opacities of the lungs and stable basilar consolidations. No pneumothorax. Bilateral pleural effusions. Bones are unremarkable. No pneumothorax. IMPRESSION: 1. Endotracheal tube tip 2 cm above carina. 2. Enteric tube tip below field of view in the abdomen. 3. Increased diffuse hazy opacities of the lungs and stable basilar consolidation. Bilateral effusions. Electronically Signed   By: Kristine Garbe M.D.   On: 05/11/2018 13:34   Dg Chest 1 View  Result Date: 05/11/2018 CLINICAL DATA:  Post thoracentesis now with worsening cough, shortness of breath and vomiting. EXAM: CHEST  1 VIEW COMPARISON:  Earlier same day; 05/09/2018; 05/08/2018 FINDINGS: Interval development of recurrent right basilar consolidative suspected airspace opacities now with partial obscuration of the right heart border. Otherwise, grossly unchanged cardiac silhouette and mediastinal contours with atherosclerotic plaque when the thoracic aorta. Unchanged small to moderate size left-sided effusion associated left basilar consolidative opacities. Pulmonary vasculature remains distinct with cephalization of flow. No pneumothorax. Stable position of support apparatus. Known peripherally calcified thyroid nodule overlies the thoracic inlet. No acute osseus abnormalities. IMPRESSION: 1. Interval development of recurrent right  basilar consolidative airspace opacities post recent large volume right-sided thoracentesis - differential considerations are broad though given provided history of vomiting, findings are concerning for aspiration with additional differential considerations including re-expansion alveolar pulmonary edema and (less likely) pulmonary hemorrhage. 2. Grossly unchanged small to moderate size left-sided effusion. 3. Persistent findings of pulmonary venous congestion/mild pulmonary edema. Above findings discussed with rapid response team at the time of image acquisition. Electronically Signed   By: Sandi Mariscal M.D.   On: 05/11/2018 11:50   Dg Chest 1 View  Result Date: 05/11/2018 CLINICAL DATA:  Post right-sided thoracentesis EXAM: CHEST  1 VIEW COMPARISON:  05/09/2018 FINDINGS: Interval reduction/near resolution of residual trace right-sided effusion post thoracentesis. No pneumothorax. No change to slight increase in small to moderate size left-sided effusion. Improved aeration of lung base with persistent right basilar heterogeneous opacities. Unchanged left basilar consolidative opacities. Pulmonary vasculature remains indistinct with cephalization of flow and nodular thickening of the pulmonary interstitium. Grossly unchanged cardiac silhouette and mediastinal contours with atherosclerotic plaque within the thoracic aorta. Calcification overlying the right-sided the thoracic inlet is unchanged and compatible with known peripherally calcified thyroid nodule. Stable position of support apparatus. No acute osseus abnormalities. IMPRESSION: 1. Interval reduction/near resolution of residual trace effusion post thoracentesis.  No pneumothorax. 2. No change to slight increase in small to moderate size left-sided pleural effusion. 3. Overall improved aeration of lungs with persistent findings of pulmonary venous congestion/mild pulmonary edema. Electronically Signed   By: Sandi Mariscal M.D.   On: 05/11/2018 10:44   Dg  Chest 1 View  Result Date: 05/08/2018 CLINICAL DATA:  Wheezing EXAM: CHEST  1 VIEW COMPARISON:  Chest x-ray of 04/23/2017 FINDINGS: There has been increase in volume of right pleural effusion with increasing right basilar atelectasis. A smaller left pleural effusion remains with probable partial atelectasis at the left lung base as well. The heart is mildly enlarged. A Port-A-Cath is now present with the tip overlying the mid lower SVC. No pneumothorax is seen. No bony abnormality is noted. IMPRESSION: 1. Increase in volume of right pleural effusion with increasing right basilar atelectasis. 2. Small left pleural effusion remains with probable partial atelectasis of the left lung base as well. 3. Port-A-Cath tip overlies the mid lower SVC. Electronically Signed   By: Ivar Drape M.D.   On: 05/08/2018 11:51   Dg Chest 2 View  Result Date: 05/09/2018 CLINICAL DATA:  Short of breath for 2 days. Cough yesterday but not today. Followup pleural effusion. EXAM: CHEST - 2 VIEW COMPARISON:  07/08/2018 and older exams. FINDINGS: Moderate to large right pleural effusion obscures the right hemidiaphragm and right heart border. Small to moderate left pleural effusion obscures the left hemidiaphragm. There are prominent vascular markings bilaterally without evidence of pulmonary edema. No evidence of pneumonia. No pneumothorax. Cardiac silhouette is normal in size. No mediastinal or hilar masses. Right anterior chest wall Port-A-Cath is stable. IMPRESSION: 1. Moderate to large right and small to moderate left pleural effusions similar to the previous day's study. No evidence of pneumonia or pulmonary edema. Electronically Signed   By: Lajean Manes M.D.   On: 05/09/2018 15:02   Dg Chest Port 1 View  Result Date: 05/14/2018 CLINICAL DATA:  Pleural effusion EXAM: PORTABLE CHEST 1 VIEW COMPARISON:  05/13/2018 FINDINGS: Cardiac shadow is stable. Endotracheal tube and nasogastric catheter have been removed in the interval.  Right chest wall port is again seen. Thyroid calcifications are noted. Bilateral pleural effusions and basilar infiltrate is seen right greater than left relatively stable from the prior exam. No acute bony abnormality is noted. IMPRESSION: Bibasilar changes right greater than left relatively stable from the prior study. Electronically Signed   By: Inez Catalina M.D.   On: 05/14/2018 08:12   Dg Chest Port 1 View  Result Date: 05/13/2018 CLINICAL DATA:  Follow-up pneumonia EXAM: PORTABLE CHEST 1 VIEW COMPARISON:  05/12/2018 FINDINGS: Endotracheal tube and nasogastric catheter and right-sided chest wall port are again seen and stable. Cardiac shadow is stable. Aortic calcifications are again noted. Right thyroid calcification is noted as well. Persistent bibasilar infiltrates are noted with associated effusions right greater than left. No bony abnormality is noted. IMPRESSION: Persistent bibasilar changes not significantly improved when compared with the prior exam. Electronically Signed   By: Inez Catalina M.D.   On: 05/13/2018 07:51   Dg Chest Port 1 View  Result Date: 05/12/2018 CLINICAL DATA:  Acute respiratory failure EXAM: PORTABLE CHEST 1 VIEW COMPARISON:  May 11, 2018 FINDINGS: The ETT terminates 16 mm above the carina. Recommend withdrawing 1 cm. The right Port-A-Cath is stable. The NG tube terminates below today's film. Layering effusion with underlying opacity on the left is similar in the interval. Focal infiltrate in the right lower lobe is stable. There  may also be pulmonary edema. No other changes. IMPRESSION: 1. The ETT terminates 1.6 cm above the carina. Recommend withdrawing 1 cm. Other support apparatus as above. 2. Effusion and underlying opacity on the left is stable to mildly worsened. Focal infiltrate on the right is stable. Probable superimposed edema. Electronically Signed   By: Dorise Bullion III M.D   On: 05/12/2018 07:32   Ct Bone Marrow Biopsy & Aspiration  Result Date:  05/05/2018 INDICATION: Lymphoma EXAM: CT BONE MARROW BIOPSY AND ASPIRATION MEDICATIONS: None. ANESTHESIA/SEDATION: Fentanyl 75 mcg IV; Versed 2 mg IV Moderate Sedation Time:  32 minutes The patient was continuously monitored during the procedure by the interventional radiology nurse under my direct supervision. FLUOROSCOPY TIME:  Fluoroscopy Time:  minutes  seconds ( mGy). COMPLICATIONS: None immediate. PROCEDURE: Informed written consent was obtained from the patient after a thorough discussion of the procedural risks, benefits and alternatives. All questions were addressed. Maximal Sterile Barrier Technique was utilized including caps, mask, sterile gowns, sterile gloves, sterile drape, hand hygiene and skin antiseptic. A timeout was performed prior to the initiation of the procedure. Under CT guidance, a(n) 11 gauge guide needle was advanced into the right iliac bone. Aspirates and a core were obtained. Post biopsy images demonstrate . Patient tolerated the procedure well without complication. Vital sign monitoring by nursing staff during the procedure will continue as patient is in the special procedures unit for post procedure observation. FINDINGS: The images document guide needle placement within the right iliac bone. Post biopsy images demonstrate no hemorrhage. IMPRESSION: Successful CT-guided right iliac bone marrow aspirate and core. Electronically Signed   By: Marybelle Killings M.D.   On: 05/05/2018 13:33   Ir Imaging Guided Port Insertion  Result Date: 05/07/2018 INDICATION: 62 year old female with a history of lymphoma. She has been referred for port catheter. EXAM: IMPLANTED PORT A CATH PLACEMENT WITH ULTRASOUND AND FLUOROSCOPIC GUIDANCE MEDICATIONS: 2 g Ancef; The antibiotic was administered within an appropriate time interval prior to skin puncture. ANESTHESIA/SEDATION: Moderate (conscious) sedation was employed during this procedure. A total of Versed mg and Fentanyl 100 mcg was administered  intravenously. Moderate Sedation Time: 20 minutes. The patient's level of consciousness and vital signs were monitored continuously by radiology nursing throughout the procedure under my direct supervision. FLUOROSCOPY TIME:  0 minutes, 6 seconds (1.5 mGy) COMPLICATIONS: None PROCEDURE: The procedure, risks, benefits, and alternatives were explained to the patient. Questions regarding the procedure were encouraged and answered. The patient understands and consents to the procedure. Ultrasound survey was performed with images stored and sent to PACs. The right neck and chest was prepped with chlorhexidine, and draped in the usual sterile fashion using maximum barrier technique (cap and mask, sterile gown, sterile gloves, large sterile sheet, hand hygiene and cutaneous antiseptic). Antibiotic prophylaxis was provided with 2.0g Ancef administered IV one hour prior to skin incision. Local anesthesia was attained by infiltration with 1% lidocaine without epinephrine. Ultrasound demonstrated patency of the right internal jugular vein, and this was documented with an image. Under real-time ultrasound guidance, this vein was accessed with a 21 gauge micropuncture needle and image documentation was performed. A small dermatotomy was made at the access site with an 11 scalpel. A 0.018" wire was advanced into the SVC and used to estimate the length of the internal catheter. The access needle exchanged for a 22F micropuncture vascular sheath. The 0.018" wire was then removed and a 0.035" wire advanced into the IVC. An appropriate location for the subcutaneous reservoir was selected  below the clavicle and an incision was made through the skin and underlying soft tissues. The subcutaneous tissues were then dissected using a combination of blunt and sharp surgical technique and a pocket was formed. A single lumen power injectable portacatheter was then tunneled through the subcutaneous tissues from the pocket to the dermatotomy and  the port reservoir placed within the subcutaneous pocket. The venous access site was then serially dilated and a peel away vascular sheath placed over the wire. The wire was removed and the port catheter advanced into position under fluoroscopic guidance. The catheter tip is positioned in the cavoatrial junction. This was documented with a spot image. The portacatheter was then tested and found to flush and aspirate well. The port was flushed with saline and Huber needle was left in place at the completion. The pocket was then closed in two layers using first subdermal inverted interrupted absorbable sutures followed by a running subcuticular suture. The epidermis was then sealed with Dermabond. The dermatotomy at the venous access site was also seal with Dermabond and Steri-Strips. Patient tolerated the procedure well and remained hemodynamically stable throughout. No complications encountered and no significant blood loss encountered . IMPRESSION: Status post right IJ port catheter placement. Catheter ready for use. Signed, Dulcy Fanny. Dellia Nims, RPVI [facility] Electronically Signed   By: Corrie Mckusick D.O.   On: 05/07/2018 17:19   US Thoracentesis Asp Pleural Space W/img Guide  Result Date: 05/11/2018 INDICATION: Symptomatic right sided pleural effusion. Please perform ultrasound-guided thoracentesis for therapeutic purposes. EXAM: US THORACENTESIS ASP PLEURAL SPACE W/IMG GUIDE COMPARISON:  Chest CT-04/18/2018; chest radiograph-05/09/2018 MEDICATIONS: None. COMPLICATIONS: None immediate. TECHNIQUE: Informed written consent was obtained from the patient after a discussion of the risks, benefits and alternatives to treatment. A timeout was performed prior to the initiation of the procedure. Initial ultrasound scanning demonstrates a large anechoic right-sided pleural effusion. The lower chest was prepped and draped in the usual sterile fashion. 1%  lidocaine was used for local anesthesia. An ultrasound image was saved for documentation purposes. An 8 Fr Safe-T-Centesis catheter was introduced. The thoracentesis was performed. The catheter was removed and a dressing was applied. The patient tolerated the procedure well without immediate post procedural complication. The patient was escorted to have an upright chest radiograph. FINDINGS: A total of approximately 1.7 liters of serous fluid was removed. IMPRESSION: Successful ultrasound-guided right sided thoracentesis yielding 1.7 liters of pleural fluid. Electronically Signed   By: Sandi Mariscal M.D.   On: 05/11/2018 10:45    ASSESSMENT & PLAN:   62 y.o. 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 S/P C1 OF [facility] on 05/08/2018  2. General LNadenopathy from lymphoma with b/l pleural effusion and b/l lower extremity weakness  3. 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 Counts improving after nadir. Neutropenia resolved Hgb 9.8 on discharge-- now 8.2 Platelets 69k on discharge now upto 233k  4. 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 LDH wnl no evidnece of overt hemolysis at this timne   5. CMV IgM+ --  Component     Latest Ref Rng & Units 05/09/2018  CMV Quant DNA PCR (Plasma)     Negative IU/mL 8,430  Log10 CMV Qn DNA Pl     log10 IU/mL 3.926    6. High risk for TLS - on allopurinol , received Rasburicase. Uric acid normalized Uric  acid 16--->8--> 4.6-->3.4--> 3  7. Port a cath in situ  8. 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 S/p Acute hypoxic respiratory failure - required intubation --now extubated and with minimal oxygen needs.  ?etiology Re-expansion pulmonary edema post large volume thoracentesis vs aspiration Though the rapidity of  presentation is unusual would need to consider HCAP and CMV pneumonitis (CMV IgM+ and PCV positive with worsening). Resolved hypoxia - treated with antibiotics for aspiration pneumonia.  10 . RUE DVT - US 6/25 -- was briefly on IV Heparin but held given thrombocytopenia -- currently on Eliquis  1.. Hypokalemia K 3 --> likely from diuretics in the hospital + Poor po intake + high dose steroids -prescription sent to her pharmacy. PLAN: -labs reviewed today -potassium prescription sent for replacement. -she will be returning for C1D1 of  Brentuximab-CHP on 05/28/2018 with G-CSF support -transfuse PRBC prn for hgb <8 -transfuse platelets for bleeding or prophylactically if <20k (in setting of infection/sepsis) -Discussed pt labwork today, 05/19/18; HGB at 8.2, PLT at 233k -continue Eliquis for anticoagulation for DVT with platelets>50k -continue with home health services and home PT/OT -Recommend that the pt continue optimizing nutritional status and activity levels  Chemotherapy as scheduled tomorrow Please schedule C3 of treatment as well RTC with Dr Irene Limbo in 8 days with labs for toxicity check Sw consultation for financial concerns  All of the patients questions were answered with apparent satisfaction. The patient knows to call the clinic with any problems, questions or concerns.  The total time spent in the appt was 40 minutes and more than 50% was on counseling and direct patient cares.    Sullivan Lone MD [place] AAHIVMS [facility] Hematology/Oncology [facility]  (Office):       [phone] (Work cell):  [phone] (Fax):           [phone]

## 2018-05-23 NOTE — Telephone Encounter (Signed)
Spouse arrive today for pick-up.  Provided bagged Lovenox medication received from provider's nurse to him at this time.  Denies any further needs or questions at this time.

## 2018-05-24 ENCOUNTER — Encounter: Payer: Self-pay | Admitting: Pharmacist

## 2018-05-27 ENCOUNTER — Telehealth: Payer: Self-pay

## 2018-05-27 ENCOUNTER — Inpatient Hospital Stay: Payer: Self-pay

## 2018-05-27 ENCOUNTER — Encounter: Payer: Self-pay | Admitting: Hematology

## 2018-05-27 ENCOUNTER — Inpatient Hospital Stay: Payer: Self-pay | Attending: Hematology | Admitting: Hematology

## 2018-05-27 ENCOUNTER — Encounter: Payer: Self-pay | Admitting: General Practice

## 2018-05-27 ENCOUNTER — Other Ambulatory Visit: Payer: Self-pay | Admitting: Hematology

## 2018-05-27 VITALS — BP 111/48 | HR 102 | Temp 98.5°F | Resp 17 | Ht 61.81 in | Wt 111.3 lb

## 2018-05-27 DIAGNOSIS — C865 Angioimmunoblastic T-cell lymphoma: Secondary | ICD-10-CM | POA: Insufficient documentation

## 2018-05-27 DIAGNOSIS — C8440 Peripheral T-cell lymphoma, not classified, unspecified site: Secondary | ICD-10-CM

## 2018-05-27 DIAGNOSIS — I82629 Acute embolism and thrombosis of deep veins of unspecified upper extremity: Secondary | ICD-10-CM

## 2018-05-27 DIAGNOSIS — Z5111 Encounter for antineoplastic chemotherapy: Secondary | ICD-10-CM | POA: Insufficient documentation

## 2018-05-27 DIAGNOSIS — R600 Localized edema: Secondary | ICD-10-CM | POA: Insufficient documentation

## 2018-05-27 DIAGNOSIS — E876 Hypokalemia: Secondary | ICD-10-CM

## 2018-05-27 DIAGNOSIS — Z5112 Encounter for antineoplastic immunotherapy: Secondary | ICD-10-CM | POA: Insufficient documentation

## 2018-05-27 DIAGNOSIS — Z7901 Long term (current) use of anticoagulants: Secondary | ICD-10-CM | POA: Insufficient documentation

## 2018-05-27 DIAGNOSIS — Z5189 Encounter for other specified aftercare: Secondary | ICD-10-CM | POA: Insufficient documentation

## 2018-05-27 DIAGNOSIS — D649 Anemia, unspecified: Secondary | ICD-10-CM | POA: Insufficient documentation

## 2018-05-27 DIAGNOSIS — I10 Essential (primary) hypertension: Secondary | ICD-10-CM | POA: Insufficient documentation

## 2018-05-27 DIAGNOSIS — D696 Thrombocytopenia, unspecified: Secondary | ICD-10-CM | POA: Insufficient documentation

## 2018-05-27 DIAGNOSIS — Z86718 Personal history of other venous thrombosis and embolism: Secondary | ICD-10-CM | POA: Insufficient documentation

## 2018-05-27 DIAGNOSIS — F17210 Nicotine dependence, cigarettes, uncomplicated: Secondary | ICD-10-CM | POA: Insufficient documentation

## 2018-05-27 DIAGNOSIS — Z79899 Other long term (current) drug therapy: Secondary | ICD-10-CM | POA: Insufficient documentation

## 2018-05-27 LAB — CBC WITH DIFFERENTIAL/PLATELET
BASOS ABS: 0.1 10*3/uL (ref 0.0–0.1)
Basophils Relative: 1 %
EOS PCT: 0 %
Eosinophils Absolute: 0 10*3/uL (ref 0.0–0.5)
HCT: 24.1 % — ABNORMAL LOW (ref 34.8–46.6)
Hemoglobin: 8.2 g/dL — ABNORMAL LOW (ref 11.6–15.9)
LYMPHS PCT: 28 %
Lymphs Abs: 1.6 10*3/uL (ref 0.9–3.3)
MCH: 27.3 pg (ref 25.1–34.0)
MCHC: 34 g/dL (ref 31.5–36.0)
MCV: 80.3 fL (ref 79.5–101.0)
MONO ABS: 0.7 10*3/uL (ref 0.1–0.9)
Monocytes Relative: 13 %
Neutro Abs: 3.4 10*3/uL (ref 1.5–6.5)
Neutrophils Relative %: 58 %
PLATELETS: 233 10*3/uL (ref 145–400)
RBC: 3 MIL/uL — ABNORMAL LOW (ref 3.70–5.45)
RDW: 15.2 % — AB (ref 11.2–14.5)
WBC: 5.8 10*3/uL (ref 3.9–10.3)

## 2018-05-27 LAB — CMP (CANCER CENTER ONLY)
ALBUMIN: 2.9 g/dL — AB (ref 3.5–5.0)
ALT: 20 U/L (ref 0–44)
AST: 24 U/L (ref 15–41)
Alkaline Phosphatase: 64 U/L (ref 38–126)
Anion gap: 6 (ref 5–15)
BILIRUBIN TOTAL: 0.9 mg/dL (ref 0.3–1.2)
BUN: 10 mg/dL (ref 8–23)
CO2: 31 mmol/L (ref 22–32)
CREATININE: 0.62 mg/dL (ref 0.44–1.00)
Calcium: 9.4 mg/dL (ref 8.9–10.3)
Chloride: 97 mmol/L — ABNORMAL LOW (ref 98–111)
GFR, Est AFR Am: >60 mL/min (ref >60)
GFR, Estimated: >60 mL/min (ref >60)
GLUCOSE: 126 mg/dL — AB (ref 70–99)
Potassium: 3 mmol/L — CL (ref 3.5–5.1)
SODIUM: 134 mmol/L — AB (ref 135–145)
TOTAL PROTEIN: 6.7 g/dL (ref 6.5–8.1)

## 2018-05-27 LAB — MAGNESIUM: Magnesium: 1.7 mg/dL (ref 1.7–2.4)

## 2018-05-27 LAB — LACTATE DEHYDROGENASE: LDH: 322 U/L — ABNORMAL HIGH (ref 98–192)

## 2018-05-27 LAB — PHOSPHORUS: Phosphorus: 3.2 mg/dL (ref 2.5–4.6)

## 2018-05-27 LAB — URIC ACID: Uric Acid, Serum: 3 mg/dL (ref 2.5–7.1)

## 2018-05-27 NOTE — Telephone Encounter (Signed)
Dr. Irene Limbo made aware of potassium of 3.0

## 2018-05-27 NOTE — Progress Notes (Signed)
[place] CSW Progress Note  Email from [employer].  States patient was referred by [employer] for help w disability application, no showed for scheduled appointment to complete process.    Edwyna Shell, LCSW Clinical Social Worker Phone:  [phone]

## 2018-05-28 ENCOUNTER — Telehealth: Payer: Self-pay

## 2018-05-28 ENCOUNTER — Other Ambulatory Visit: Payer: Self-pay | Admitting: Hematology

## 2018-05-28 ENCOUNTER — Encounter: Payer: Self-pay | Admitting: General Practice

## 2018-05-28 ENCOUNTER — Other Ambulatory Visit: Payer: Self-pay

## 2018-05-28 ENCOUNTER — Other Ambulatory Visit: Payer: Self-pay | Admitting: Medical Oncology

## 2018-05-28 ENCOUNTER — Inpatient Hospital Stay: Payer: Self-pay

## 2018-05-28 VITALS — BP 96/36 | HR 97 | Resp 17 | Wt 111.2 lb

## 2018-05-28 DIAGNOSIS — C8440 Peripheral T-cell lymphoma, not classified, unspecified site: Secondary | ICD-10-CM

## 2018-05-28 MED ORDER — HEPARIN SOD (PORK) LOCK FLUSH 100 UNIT/ML IV SOLN
500.0000 [IU] | Freq: Once | INTRAVENOUS | Status: AC | PRN
  Administered 2018-05-28: 500 [IU]
  Filled 2018-05-28: qty 5

## 2018-05-28 MED ORDER — SODIUM CHLORIDE 0.9 % IV SOLN
1.8000 mg/kg | INTRAVENOUS | Status: DC
  Administered 2018-05-28: 90 mg via INTRAVENOUS
  Filled 2018-05-28: qty 18

## 2018-05-28 MED ORDER — PEGFILGRASTIM 6 MG/0.6ML ~~LOC~~ PSKT: 6.0000 mg | PREFILLED_SYRINGE | Freq: Once | SUBCUTANEOUS | Status: DC

## 2018-05-28 MED ORDER — DEXAMETHASONE SODIUM PHOSPHATE 10 MG/ML IJ SOLN
10.0000 mg | Freq: Once | INTRAMUSCULAR | Status: AC
  Administered 2018-05-28: 10 mg via INTRAVENOUS

## 2018-05-28 MED ORDER — DOXORUBICIN HCL CHEMO IV INJECTION 2 MG/ML
74.0000 mg | Freq: Once | INTRAVENOUS | Status: AC
  Administered 2018-05-28: 74 mg via INTRAVENOUS
  Filled 2018-05-28: qty 37

## 2018-05-28 MED ORDER — ONDANSETRON HCL 8 MG PO TABS: 8.0000 mg | ORAL_TABLET | Freq: Three times a day (TID) | ORAL | 3 refills | Status: DC | PRN

## 2018-05-28 MED ORDER — DEXAMETHASONE SODIUM PHOSPHATE 10 MG/ML IJ SOLN
INTRAMUSCULAR | Status: AC
  Filled 2018-05-28: qty 1

## 2018-05-28 MED ORDER — SODIUM CHLORIDE 0.9 % IV SOLN
1110.0000 mg | Freq: Once | INTRAVENOUS | Status: AC
  Administered 2018-05-28: 1120 mg via INTRAVENOUS
  Filled 2018-05-28: qty 56

## 2018-05-28 MED ORDER — PROCHLORPERAZINE MALEATE 10 MG PO TABS: 10.0000 mg | ORAL_TABLET | Freq: Four times a day (QID) | ORAL | 1 refills | Status: DC | PRN

## 2018-05-28 MED ORDER — SODIUM CHLORIDE 0.9 % IV SOLN: 1.5000 mg/kg | INTRAVENOUS | Status: DC

## 2018-05-28 MED ORDER — SODIUM CHLORIDE 0.9 % IV SOLN
Freq: Once | INTRAVENOUS | Status: AC
  Administered 2018-05-28: 08:00:00 via INTRAVENOUS

## 2018-05-28 MED ORDER — SODIUM CHLORIDE 0.9 % IV SOLN
Freq: Once | INTRAVENOUS | Status: AC
  Administered 2018-05-28: 09:00:00 via INTRAVENOUS

## 2018-05-28 MED ORDER — SODIUM CHLORIDE 0.9% FLUSH
10.0000 mL | INTRAVENOUS | Status: DC | PRN
  Administered 2018-05-28: 10 mL
  Filled 2018-05-28: qty 10

## 2018-05-28 MED ORDER — PALONOSETRON HCL INJECTION 0.25 MG/5ML
INTRAVENOUS | Status: AC
  Filled 2018-05-28: qty 5

## 2018-05-28 MED ORDER — PALONOSETRON HCL INJECTION 0.25 MG/5ML
0.2500 mg | Freq: Once | INTRAVENOUS | Status: AC
  Administered 2018-05-28: 0.25 mg via INTRAVENOUS

## 2018-05-28 MED ORDER — LORAZEPAM 0.5 MG PO TABS: 0.5000 mg | ORAL_TABLET | Freq: Three times a day (TID) | ORAL | 0 refills | Status: DC | PRN

## 2018-05-28 NOTE — Progress Notes (Signed)
[place] CSW Progress Notes  Referral received from scheduling - patient needs resources for financial and insurance help.  Called patient - she was resting and unable to come to phone.  Spoke w husband, Herbie [place].  Encouraged them to reschedule appointment w [facility] in order to begin disability application.  Per husband, patient has significant work history so unclear whether she will be considered for SSI or SSDI.  [place] can work w patient to discuss options.  Also described additional resources available through the [facility], [place] support group and [place] - information packet mailed for all the above.  Encouraged husband to contact CSW as needed - contact information given.    Edwyna Shell, LCSW Clinical Social Worker Phone:  [phone]

## 2018-05-28 NOTE — Patient Instructions (Addendum)
[place] Discharge Instructions for Patients Receiving Chemotherapy  Today you received the following chemotherapy agents Adcetris, Adriamycin, Cytoxan  To help prevent nausea and vomiting after your treatment, we encourage you to take your nausea medication as directed  If you develop nausea and vomiting that is not controlled by your nausea medication, call the clinic.   BELOW ARE SYMPTOMS THAT SHOULD BE REPORTED IMMEDIATELY:  *FEVER GREATER THAN 100.5 F  *CHILLS WITH OR WITHOUT FEVER  NAUSEA AND VOMITING THAT IS NOT CONTROLLED WITH YOUR NAUSEA MEDICATION  *UNUSUAL SHORTNESS OF BREATH  *UNUSUAL BRUISING OR BLEEDING  TENDERNESS IN MOUTH AND THROAT WITH OR WITHOUT PRESENCE OF ULCERS  *URINARY PROBLEMS  *BOWEL PROBLEMS  UNUSUAL RASH Items with * indicate a potential emergency and should be followed up as soon as possible.  Feel free to call the clinic should you have any questions or concerns. The clinic phone number is [phone].  Please show the [facility] at check-in to the Emergency Department and triage nurse.  Brentuximab vedotin solution for injection What is this medicine? BRENTUXIMAB VEDOTIN (bren TUX see mab ve DOE tin) is a monoclonal antibody and a chemotherapy drug. It is used for treating Hodgkin lymphoma and certain non-Hodgkin lymphomas, such as anaplastic large-cell lymphoma and mycosis fungoides. This medicine may be used for other purposes; ask your health care provider or pharmacist if you have questions. COMMON BRAND NAME(S): ADCETRIS What should I tell my health care provider before I take this medicine? They need to know if you have any of these conditions: -immune system problems -infection (especially a virus infection such as chickenpox, cold sores, or herpes) -kidney disease -liver disease -low blood counts, like low white cell, platelet, or red cell counts -tingling of the fingers or toes, or other nerve  disorder -an unusual or allergic reaction to brentuximab vedotin, other medicines, foods, dyes, or preservatives -pregnant or trying to get pregnant -breast-feeding How should I use this medicine? This medicine is for infusion into a vein. It is given by a health care professional in a hospital or clinic setting. Talk to your pediatrician regarding the use of this medicine in children. Special care may be needed. Overdosage: If you think you have taken too much of this medicine contact a poison control center or emergency room at once. NOTE: This medicine is only for you. Do not share this medicine with others. What if I miss a dose? It is important not to miss your dose. Call your doctor or health care professional if you are unable to keep an appointment. What may interact with this medicine? This medicine may interact with the following medications: -ketoconazole -rifampin -St. John's wort; Hypericum perforatum This list may not describe all possible interactions. Give your health care provider a list of all the medicines, herbs, non-prescription drugs, or dietary supplements you use. Also tell them if you smoke, drink alcohol, or use illegal drugs. Some items may interact with your medicine. What should I watch for while using this medicine? Visit your doctor for checks on your progress. This drug may make you feel generally unwell. Report any side effects. Continue your course of treatment even though you feel ill unless your doctor tells you to stop. Call your doctor or health care professional for advice if you get a fever, chills or sore throat, or other symptoms of a cold or flu. Do not treat yourself. This drug decreases your body's ability to fight infections. Try to avoid being around  people who are sick. This medicine may increase your risk to bruise or bleed. Call your doctor or health care professional if you notice any unusual bleeding. In some patients, this medicine may cause a  serious brain infection that may cause death. If you have any problems seeing, thinking, speaking, walking, or standing, tell your doctor right away. If you cannot reach your doctor, urgently seek other source of medical care. Do not become pregnant while taking this medicine or for 6 months after stopping it. Women should inform their doctor if they wish to become pregnant or think they might be pregnant. Men should not father a child while taking this medicine and for 6 months after stopping it. There is a potential for serious side effects to an unborn child. Talk to your health care professional or pharmacist for more information. Do not breast-feed an infant while taking this medicine. This may interfere with the ability to father a child. You should talk to your doctor or health care professional if you are concerned about your fertility. What side effects may I notice from receiving this medicine? Side effects that you should report to your doctor or health care professional as soon as possible: -allergic reactions like skin rash, itching or hives, swelling of the face, lips, or tongue -changes in emotions or moods -diarrhea -low blood counts - this medicine may decrease the number of white blood cells, red blood cells and platelets. You may be at increased risk for infections and bleeding. -pain, tingling, numbness in the hands or feet -redness, blistering, peeling or loosening of the skin, including inside the mouth -shortness of breath -signs of infection - fever or chills, cough, sore throat, pain or difficulty passing urine -signs of decreased platelets or bleeding - bruising, pinpoint red spots on the skin, black, tarry stools, blood in the urine -signs of decreased red blood cells - unusually weak or tired, fainting spells, lightheadedness -signs of liver injury like dark yellow or brown urine; general ill feeling or flu-like symptoms; light-colored stools; loss of appetite; nausea;  right upper belly pain; yellowing of the eyes or skin -stomach pain -sudden numbness or weakness of the face, arm or leg -vomiting Side effects that usually do not require medical attention (report to your doctor or health care professional if they continue or are bothersome): -dizziness -headache -muscle pain -tiredness This list may not describe all possible side effects. Call your doctor for medical advice about side effects. You may report side effects to FDA at 1-800-FDA-1088. Where should I keep my medicine? This drug is given in a hospital or clinic and will not be stored at home. NOTE: This sheet is a summary. It may not cover all possible information. If you have questions about this medicine, talk to your doctor, pharmacist, or health care provider.  2018 Elsevier/Gold Standard (2016-09-28 18:06:58)  Doxorubicin injection What is this medicine? DOXORUBICIN (dox oh ROO bi sin) is a chemotherapy drug. It is used to treat many kinds of cancer like leukemia, lymphoma, neuroblastoma, sarcoma, and Wilms' tumor. It is also used to treat bladder cancer, breast cancer, lung cancer, ovarian cancer, stomach cancer, and thyroid cancer. This medicine may be used for other purposes; ask your health care provider or pharmacist if you have questions. COMMON BRAND NAME(S): Adriamycin, Adriamycin PFS, Adriamycin RDF, Rubex What should I tell my health care provider before I take this medicine? They need to know if you have any of these conditions: -heart disease -history of low blood counts  caused by a medicine -liver disease -recent or ongoing radiation therapy -an unusual or allergic reaction to doxorubicin, other chemotherapy agents, other medicines, foods, dyes, or preservatives -pregnant or trying to get pregnant -breast-feeding How should I use this medicine? This drug is given as an infusion into a vein. It is administered in a hospital or clinic by a specially trained health care  professional. If you have pain, swelling, burning or any unusual feeling around the site of your injection, tell your health care professional right away. Talk to your pediatrician regarding the use of this medicine in children. Special care may be needed. Overdosage: If you think you have taken too much of this medicine contact a poison control center or emergency room at once. NOTE: This medicine is only for you. Do not share this medicine with others. What if I miss a dose? It is important not to miss your dose. Call your doctor or health care professional if you are unable to keep an appointment. What may interact with this medicine? This medicine may interact with the following medications: -6-mercaptopurine -paclitaxel -phenytoin -St. John's Wort -trastuzumab -verapamil This list may not describe all possible interactions. Give your health care provider a list of all the medicines, herbs, non-prescription drugs, or dietary supplements you use. Also tell them if you smoke, drink alcohol, or use illegal drugs. Some items may interact with your medicine. What should I watch for while using this medicine? This drug may make you feel generally unwell. This is not uncommon, as chemotherapy can affect healthy cells as well as cancer cells. Report any side effects. Continue your course of treatment even though you feel ill unless your doctor tells you to stop. There is a maximum amount of this medicine you should receive throughout your life. The amount depends on the medical condition being treated and your overall health. Your doctor will watch how much of this medicine you receive in your lifetime. Tell your doctor if you have taken this medicine before. You may need blood work done while you are taking this medicine. Your urine may turn red for a few days after your dose. This is not blood. If your urine is dark or brown, call your doctor. In some cases, you may be given additional medicines to  help with side effects. Follow all directions for their use. Call your doctor or health care professional for advice if you get a fever, chills or sore throat, or other symptoms of a cold or flu. Do not treat yourself. This drug decreases your body's ability to fight infections. Try to avoid being around people who are sick. This medicine may increase your risk to bruise or bleed. Call your doctor or health care professional if you notice any unusual bleeding. Talk to your doctor about your risk of cancer. You may be more at risk for certain types of cancers if you take this medicine. Do not become pregnant while taking this medicine or for 6 months after stopping it. Women should inform their doctor if they wish to become pregnant or think they might be pregnant. Men should not father a child while taking this medicine and for 6 months after stopping it. There is a potential for serious side effects to an unborn child. Talk to your health care professional or pharmacist for more information. Do not breast-feed an infant while taking this medicine. This medicine has caused ovarian failure in some women and reduced sperm counts in some men This medicine may interfere with  the ability to have a child. Talk with your doctor or health care professional if you are concerned about your fertility. What side effects may I notice from receiving this medicine? Side effects that you should report to your doctor or health care professional as soon as possible: -allergic reactions like skin rash, itching or hives, swelling of the face, lips, or tongue -breathing problems -chest pain -fast or irregular heartbeat -low blood counts - this medicine may decrease the number of white blood cells, red blood cells and platelets. You may be at increased risk for infections and bleeding. -pain, redness, or irritation at site where injected -signs of infection - fever or chills, cough, sore throat, pain or difficulty passing  urine -signs of decreased platelets or bleeding - bruising, pinpoint red spots on the skin, black, tarry stools, blood in the urine -swelling of the ankles, feet, hands -tiredness -weakness Side effects that usually do not require medical attention (report to your doctor or health care professional if they continue or are bothersome): -diarrhea -hair loss -mouth sores -nail discoloration or damage -nausea -red colored urine -vomiting This list may not describe all possible side effects. Call your doctor for medical advice about side effects. You may report side effects to FDA at 1-800-FDA-1088. Where should I keep my medicine? This drug is given in a hospital or clinic and will not be stored at home. NOTE: This sheet is a summary. It may not cover all possible information. If you have questions about this medicine, talk to your doctor, pharmacist, or health care provider.  2018 Elsevier/Gold Standard (2016-01-02 11:28:51)  Cyclophosphamide injection What is this medicine? CYCLOPHOSPHAMIDE (sye kloe FOSS fa mide) is a chemotherapy drug. It slows the growth of cancer cells. This medicine is used to treat many types of cancer like lymphoma, myeloma, leukemia, breast cancer, and ovarian cancer, to name a few. This medicine may be used for other purposes; ask your health care provider or pharmacist if you have questions. COMMON BRAND NAME(S): Cytoxan, Neosar What should I tell my health care provider before I take this medicine? They need to know if you have any of these conditions: -blood disorders -history of other chemotherapy -infection -kidney disease -liver disease -recent or ongoing radiation therapy -tumors in the bone marrow -an unusual or allergic reaction to cyclophosphamide, other chemotherapy, other medicines, foods, dyes, or preservatives -pregnant or trying to get pregnant -breast-feeding How should I use this medicine? This drug is usually given as an injection into a  vein or muscle or by infusion into a vein. It is administered in a hospital or clinic by a specially trained health care professional. Talk to your pediatrician regarding the use of this medicine in children. Special care may be needed. Overdosage: If you think you have taken too much of this medicine contact a poison control center or emergency room at once. NOTE: This medicine is only for you. Do not share this medicine with others. What if I miss a dose? It is important not to miss your dose. Call your doctor or health care professional if you are unable to keep an appointment. What may interact with this medicine? This medicine may interact with the following medications: -amiodarone -amphotericin B -azathioprine -certain antiviral medicines for HIV or AIDS such as protease inhibitors (e.g., indinavir, ritonavir) and zidovudine -certain blood pressure medications such as benazepril, captopril, enalapril, fosinopril, lisinopril, moexipril, monopril, perindopril, quinapril, ramipril, trandolapril -certain cancer medications such as anthracyclines (e.g., daunorubicin, doxorubicin), busulfan, cytarabine, paclitaxel, pentostatin, tamoxifen,  trastuzumab -certain diuretics such as chlorothiazide, chlorthalidone, hydrochlorothiazide, indapamide, metolazone -certain medicines that treat or prevent blood clots like warfarin -certain muscle relaxants such as succinylcholine -cyclosporine -etanercept -indomethacin -medicines to increase blood counts like filgrastim, pegfilgrastim, sargramostim -medicines used as general anesthesia -metronidazole -natalizumab This list may not describe all possible interactions. Give your health care provider a list of all the medicines, herbs, non-prescription drugs, or dietary supplements you use. Also tell them if you smoke, drink alcohol, or use illegal drugs. Some items may interact with your medicine. What should I watch for while using this medicine? Visit  your doctor for checks on your progress. This drug may make you feel generally unwell. This is not uncommon, as chemotherapy can affect healthy cells as well as cancer cells. Report any side effects. Continue your course of treatment even though you feel ill unless your doctor tells you to stop. Drink water or other fluids as directed. Urinate often, even at night. In some cases, you may be given additional medicines to help with side effects. Follow all directions for their use. Call your doctor or health care professional for advice if you get a fever, chills or sore throat, or other symptoms of a cold or flu. Do not treat yourself. This drug decreases your body's ability to fight infections. Try to avoid being around people who are sick. This medicine may increase your risk to bruise or bleed. Call your doctor or health care professional if you notice any unusual bleeding. Be careful brushing and flossing your teeth or using a toothpick because you may get an infection or bleed more easily. If you have any dental work done, tell your dentist you are receiving this medicine. You may get drowsy or dizzy. Do not drive, use machinery, or do anything that needs mental alertness until you know how this medicine affects you. Do not become pregnant while taking this medicine or for 1 year after stopping it. Women should inform their doctor if they wish to become pregnant or think they might be pregnant. Men should not father a child while taking this medicine and for 4 months after stopping it. There is a potential for serious side effects to an unborn child. Talk to your health care professional or pharmacist for more information. Do not breast-feed an infant while taking this medicine. This medicine may interfere with the ability to have a child. This medicine has caused ovarian failure in some women. This medicine has caused reduced sperm counts in some men. You should talk with your doctor or health care  professional if you are concerned about your fertility. If you are going to have surgery, tell your doctor or health care professional that you have taken this medicine. What side effects may I notice from receiving this medicine? Side effects that you should report to your doctor or health care professional as soon as possible: -allergic reactions like skin rash, itching or hives, swelling of the face, lips, or tongue -low blood counts - this medicine may decrease the number of white blood cells, red blood cells and platelets. You may be at increased risk for infections and bleeding. -signs of infection - fever or chills, cough, sore throat, pain or difficulty passing urine -signs of decreased platelets or bleeding - bruising, pinpoint red spots on the skin, black, tarry stools, blood in the urine -signs of decreased red blood cells - unusually weak or tired, fainting spells, lightheadedness -breathing problems -dark urine -dizziness -palpitations -swelling of the ankles, feet, hands -  trouble passing urine or change in the amount of urine -weight gain -yellowing of the eyes or skin Side effects that usually do not require medical attention (report to your doctor or health care professional if they continue or are bothersome): -changes in nail or skin color -hair loss -missed menstrual periods -mouth sores -nausea, vomiting This list may not describe all possible side effects. Call your doctor for medical advice about side effects. You may report side effects to FDA at 1-800-FDA-1088. Where should I keep my medicine? This drug is given in a hospital or clinic and will not be stored at home. NOTE: This sheet is a summary. It may not cover all possible information. If you have questions about this medicine, talk to your doctor, pharmacist, or health care provider.  2018 Elsevier/Gold Standard (2012-09-19 16:22:58)

## 2018-05-28 NOTE — Telephone Encounter (Signed)
Otila Kluver with [facility] called and stated patient's O2 saturation was 88%-90% after she returned home from her first chemo treatment today. Per Otila Kluver, patient currently in no respiratory distress and no complaints of pain. Dr. Irene Limbo made aware. Verbal order received for O2 via nasal cannula at 2 LPM as needed.Order placed in [place].

## 2018-05-28 NOTE — Telephone Encounter (Signed)
Printed schedule per 7/9 los ad will take to patient in infusion this morning. With 1 added (c3) treatment

## 2018-05-28 NOTE — Telephone Encounter (Signed)
During infusion today patient asked what medication it was that Dr. Irene Limbo recommend stopping d/t rash.  Per Dr. Irene Limbo, allopurinol was recommend to discontinue.  Notified patient via phone.

## 2018-05-28 NOTE — Progress Notes (Signed)
Per Dr. Alen Blew, ok to proceed with treatment with 05/28/18 vital signs.

## 2018-05-28 NOTE — Telephone Encounter (Signed)
Spoke to [employer] and informed him that per Dr. Irene Limbo it is ok for patient to receive OT one to two times per week.

## 2018-05-29 ENCOUNTER — Inpatient Hospital Stay: Payer: Self-pay

## 2018-05-29 VITALS — BP 95/51 | HR 84 | Temp 97.4°F | Resp 18

## 2018-05-29 DIAGNOSIS — C8440 Peripheral T-cell lymphoma, not classified, unspecified site: Secondary | ICD-10-CM

## 2018-05-29 MED ORDER — PEGFILGRASTIM INJECTION 6 MG/0.6ML ~~LOC~~
PREFILLED_SYRINGE | SUBCUTANEOUS | Status: AC
  Filled 2018-05-29: qty 0.6

## 2018-05-29 MED ORDER — PEGFILGRASTIM INJECTION 6 MG/0.6ML ~~LOC~~
6.0000 mg | PREFILLED_SYRINGE | Freq: Once | SUBCUTANEOUS | Status: AC
  Administered 2018-05-29: 6 mg via SUBCUTANEOUS

## 2018-05-30 ENCOUNTER — Other Ambulatory Visit: Payer: Self-pay | Admitting: Hematology

## 2018-05-30 MED ORDER — ONDANSETRON HCL 8 MG PO TABS
8.0000 mg | ORAL_TABLET | Freq: Three times a day (TID) | ORAL | 3 refills | Status: DC | PRN
Start: 2018-05-30 — End: 2019-06-11

## 2018-06-02 ENCOUNTER — Telehealth: Payer: Self-pay

## 2018-06-02 MED ORDER — POTASSIUM CHLORIDE CRYS ER 20 MEQ PO TBCR: EXTENDED_RELEASE_TABLET | ORAL | 0 refills | Status: DC

## 2018-06-02 NOTE — Telephone Encounter (Signed)
Called and spoke to patient to confirm she received earlier message about her potassium level. Patient stated her husband is at the pharmacy now picking up med.

## 2018-06-02 NOTE — Telephone Encounter (Signed)
Called patient x3 and left a voicemail informing her that per Dr. Irene Limbo her potassium level is low and a prescription has been sent to her pharmacy and she should start taking the medication immediately. Left message for patient to call [phone] to let me know she received the message.

## 2018-06-03 NOTE — Progress Notes (Signed)
HEMATOLOGY/ONCOLOGY CLINIC NOTE  Date of Service: 06/04/2018  Patient Care Team: Patient, No Pcp Per as PCP - General ([place]) Deborah Ivan, MD (Unknown Physician Specialty)  CHIEF COMPLAINTS/PURPOSE OF CONSULTATION:  Newly diagnosed angioimmunoblastic T cell lymphoma  HISTORY OF PRESENTING ILLNESS:   Deborah Cooley is a wonderful 62 y.o. female who has been referred to [place] by Dr Deborah Cooley for evaluation and management of her newly diagnosed angioimmunoblastic T cell lymphoma.   Patient has a h/o HTN, endometriosis, tobacco use who was admitted to the hospital with shortness of breath and was subsequently found to have diffuse lymphadenopathy. She was also found to have bilateral pleural effusions and has had a thoracentesis with pleural fluid demonstrating atypical lymphocytosis. She was discharged but returned to the ED with complaints of persisting SOB, abdominal swelling, and lower extremity swelling.   She has had a  CTA chest and CT abd/pelvis which showed no PE.but extensive LNadenopathy in the chest and throughout the abd with b/l pleural effusion and mild ascites. Also noted to have narrowing in b/l common iliac arteries.  Clinically she is noted to have b/l LE edema 1-2+  Most recent lab results (05/05/18) of CBC  is as follows: all values are WNL except for RBC at 2.59, HGB at 7.7, HCT at 22.7, RDW at 16.0, PLT at 89k.  She has had a CT bone marrow biopsy on 05/05/2018 -results pending. ECHO was done and showed  Systolic function was   normal. The estimated ejection fraction was in the range of 60%   to 65%. Wall motion was normal; there were no regional wall   motion abnormalities. Left ventricular diastolic function   parameters were normal.  Port a cath placement requested.  Patient was transferred to [facility] with her consent to receive C1 of chemotherapy as inpatient.  Interval History:   Deborah Cooley returns today for management and  evaluation of her Angioimmunoblastic T cell lymphoma and after receiving C2 treatment. The patient's last visit with [place] was on 05/27/18. She is accompanied today by her husband. The pt reports that she is doing well overall.   The pt reports that she has felt very tired for the last few days, feeling too sleepy to eat very much. She notes that she has also had some mild light headedness and dizziness in the last couple days. She did have some bone pains after her Neulasta shot, which lasted for a couple days.   She notes that she is consuming a couple Boosts each day and has also been taking a multi-vitamin. She took Miralax until she began to have diarrhea and has since stopped taking this. She notes that she has been having soft but formed stools 5-6 times a day for the last few days, sometimes small and sometimes large amounts. She notes that she is drinking lots of water.   She notes that her leg and arm swelling have completely resolved and denies redness or pain in her right arm, where her previous clot was.   Lab results today (06/04/18) of CBC w/diff is as follows: all values are WNL except for WBC at 1.0k, RBC at 2.55, HGB at 6.8, HCT at 20.2, MCV at 79.2, RDW at 14.9, ANC at 500, Lymphs abs at 500, Monocytes abs at [facility]. CMP 06/04/18 is reviewed LDH 06/04/18 is 322-->194 Uric acid 06/04/18 is 3.6  On review of systems, pt reports some fatigue, light headedness, dizziness, resolved leg and arm swelling, and denies fevers,  chills, breathing difficulty, and any other symptoms.   MEDICAL HISTORY:  Past Medical History:  Diagnosis Date  . Bilateral pleural effusion 04/17/2018  . Endometriosis   . Essential hypertension, benign   . Family history of adverse reaction to anesthesia    "sister stops breathing I think" (04/18/2018)  . Heart murmur   . Mixed hyperlipidemia   . Pneumonia 04/17/2018    SURGICAL HISTORY: Past Surgical History:  Procedure Laterality Date  . ABDOMINAL HYSTERECTOMY   1970s/1980s   for endometriosis  . Arthroscopic right shoulder surgery    . AXILLARY LYMPH NODE BIOPSY Left 04/21/2018   Procedure: LEFT AXILLARY LYMPH NODE BIOPSY;  Surgeon: Kieth Brightly, Arta Bruce, MD;  Location: [facility];  Service: General;  Laterality: Left;  . CARPAL TUNNEL RELEASE Right   . GANGLION CYST EXCISION Right   . IR IMAGING GUIDED PORT INSERTION  05/07/2018  . MULTIPLE TOOTH EXTRACTIONS    . OOPHORECTOMY  1997  . PARTIAL HYSTERECTOMY  1984  . Right hand surgery    . SHOULDER OPEN ROTATOR CUFF REPAIR Right   . VESICOVAGINAL FISTULA CLOSURE W/ TAH      SOCIAL HISTORY: Social History   Socioeconomic History  . Marital status: Married    Spouse name: Not on file  . Number of children: Not on file  . Years of education: Not on file  . Highest education level: Not on file  Occupational History  . Occupation: Social research officer, government    Comment: works at [employer]  . Financial resource strain: Not on file  . Food insecurity:    Worry: Not on file    Inability: Not on file  . Transportation needs:    Medical: Not on file    Non-medical: Not on file  Tobacco Use  . Smoking status: Current Every Day Smoker    Packs/day: 1.50    Years: 46.00    Pack years: 69.00    Types: Cigarettes  . Smokeless tobacco: Never Used  Substance and Sexual Activity  . Alcohol use: Not Currently    Comment: 04/18/2018 "quit years ago"  . Drug use: Not Currently    Comment: "back in my 74s"  . Sexual activity: Not Currently  Lifestyle  . Physical activity:    Days per week: Not on file    Minutes per session: Not on file  . Stress: Not on file  Relationships  . Social connections:    Talks on phone: Not on file    Gets together: Not on file    Attends religious service: Not on file    Active member of club or organization: Not on file    Attends meetings of clubs or organizations: Not on file    Relationship status: Not on file  . Intimate partner violence:    Fear  of current or ex partner: Not on file    Emotionally abused: Not on file    Physically abused: Not on file    Forced sexual activity: Not on file  Other Topics Concern  . Not on file  Social History Narrative   ** Merged History Encounter **       Patient has one son whom is healthy.   Has a live in boyfriend.    FAMILY HISTORY: Family History  Problem Relation Age of Onset  . Diabetes Mother        age 20  . Heart attack Father        died  age 73's  . Hypertension Father   . Cancer Sister        breast cancer diagonsed age 87 years  . Diabetes Unknown   . Heart disease Unknown        Female <55  . Arthritis Unknown   . Asthma Unknown   . Hyperlipidemia Other        several siblings  . Thyroid disease Sister        one sister with thyroid disease    ALLERGIES:  is allergic to [place] [zolpidem].  MEDICATIONS:  Current Outpatient Medications  Medication Sig Dispense Refill  . acetaminophen (TYLENOL) 325 MG tablet Take 2 tablets (650 mg total) by mouth every 6 (six) hours as needed for mild pain (or Fever >/= 101).    Marland Kitchen allopurinol (ZYLOPRIM) 300 MG tablet Take 1 tablet (300 mg total) by mouth daily. 30 tablet 0  . ELIQUIS STARTER PACK (ELIQUIS STARTER PACK) 5 MG TABS Take as directed on package: start with two-37m tablets twice daily for 7 days. On day 8, switch to one-569mtablet twice daily. 1 each 0  . LORazepam (ATIVAN) 0.5 MG tablet Take 1 tablet (0.5 mg total) by mouth every 8 (eight) hours as needed for anxiety. 30 tablet 0  . Multiple Vitamin (MULTIVITAMIN WITH MINERALS) TABS tablet Take 1 tablet by mouth daily. 30 tablet 0  . ondansetron (ZOFRAN) 8 MG tablet Take 1 tablet (8 mg total) by mouth every 8 (eight) hours as needed for nausea. Start on day 3 after chemotherapy 30 tablet 3  . polyethylene glycol (MIRALAX / GLYCOLAX) packet Take 17 g by mouth daily as needed for mild constipation. 30 each 0  . potassium chloride SA (K-DUR,KLOR-CON) 20 MEQ tablet 40 meq orally  twice daily for 2 daily then 2065mPO daily 30 tablet 0  . predniSONE (DELTASONE) 20 MG tablet Take 3 tablets (60 mg total) by mouth daily. Take on days 1-5 of chemotherapy. (Patient taking differently: Take 10 mg by mouth daily. Take on days 1-5 of chemotherapy.) 15 tablet 6  . prochlorperazine (COMPAZINE) 10 MG tablet Take 1 tablet (10 mg total) by mouth every 6 (six) hours as needed for nausea or vomiting. 30 tablet 1   No current facility-administered medications for this visit.     REVIEW OF SYSTEMS:    A 10+ POINT REVIEW OF SYSTEMS WAS OBTAINED including neurology, dermatology, psychiatry, cardiac, respiratory, lymph, extremities, GI, GU, Musculoskeletal, constitutional, breasts, reproductive, HEENT.  All pertinent positives are noted in the HPI.  All others are negative.   PHYSICAL EXAMINATION: ECOG PERFORMANCE STATUS: 2 - Symptomatic, <50% confined to bed  . Vitals:   06/04/18 0829  BP: (!) 85/45  Pulse: (!) 116  Resp: 18  Temp: 98.1 F (36.7 C)  SpO2: 100%   Filed Weights   06/04/18 0829  Weight: 102 lb 11.2 oz (46.6 kg)   .Body mass index is 18.9 kg/m.  GENERAL:alert, in no acute distress and comfortable SKIN: no acute rashes, no significant lesions EYES: conjunctival pallor +, sclera anicteric OROPHARYNX: MMM, no exudates, no oropharyngeal erythema or ulceration NECK: supple, no JVD LYMPH:  no palpable lymphadenopathy in the cervical, axillary or inguinal regions LUNGS: decreased breath sounds 1/5 up right HEART: regular rate & rhythm ABDOMEN:  normoactive bowel sounds , non tender, not distended. No palpable hepatosplenomegaly.  Extremity: 2+ bilateral pedal edema (improving) PSYCH: alert & oriented x 3 with fluent speech NEURO: no focal motor/sensory deficits     LABORATORY DATA:  I have reviewed the data as listed  . CBC Latest Ref Rng & Units 06/04/2018 05/27/2018 05/19/2018  WBC 3.9 - 10.3 K/uL 1.0(L) 5.8 -  Hemoglobin 11.6 - 15.9 g/dL 6.8(LL) 8.2(L)  9.8(L)  Hematocrit 34.8 - 46.6 % 20.2(L) 24.1(L) 28.2(L)  Platelets 145 - 400 K/uL 151 233 -   . CBC    Component Value Date/Time   WBC 1.0 (L) 06/04/2018 0802   RBC 2.55 (L) 06/04/2018 0802   HGB 6.8 (LL) 06/04/2018 0802   HCT 20.2 (L) 06/04/2018 0802   PLT 151 06/04/2018 0802   MCV 79.2 (L) 06/04/2018 0802   MCH 26.7 06/04/2018 0802   MCHC 33.7 06/04/2018 0802   RDW 14.9 (H) 06/04/2018 0802   LYMPHSABS 0.5 (L) 06/04/2018 0802   MONOABS 0.0 (L) 06/04/2018 0802   EOSABS 0.0 06/04/2018 0802   BASOSABS 0.0 06/04/2018 0802    . CMP Latest Ref Rng & Units 05/27/2018 05/19/2018 05/18/2018  Glucose 70 - 99 mg/dL 126(H) 115(H) 133(H)  BUN 8 - 23 mg/dL '10 8 8  ' Creatinine 0.44 - 1.00 mg/dL 0.62 0.42(L) 0.43(L)  Sodium 135 - 145 mmol/L 134(L) 132(L) 135  Potassium 3.5 - 5.1 mmol/L 3.0(LL) 3.2(L) 3.7  Chloride 98 - 111 mmol/L 97(L) 99 103  CO2 22 - 32 mmol/L '31 25 26  ' Calcium 8.9 - 10.3 mg/dL 9.4 7.8(L) 7.9(L)  Total Protein 6.5 - 8.1 g/dL 6.7 - -  Total Bilirubin 0.3 - 1.2 mg/dL 0.9 - -  Alkaline Phos 38 - 126 U/L 64 - -  AST 15 - 41 U/L 24 - -  ALT 0 - 44 U/L 20 - -    . Lab Results  Component Value Date   LDH 322 (H) 05/27/2018      Component     Latest Ref Rng & Units 04/18/2018 05/03/2018  Hepatitis B Surface Ag     Negative  Negative  HCV Ab     0.0 - 0.9 s/co ratio  <0.1  Hep A Ab, IgM     Negative  Negative  Hep B Core Ab, IgM     Negative  Negative  HIV Screen 4th Generation wRfx     Non Reactive Non Reactive     04/21/18 Molecular Pathology:   04/21/18 Surgical Pathology:    RADIOGRAPHIC STUDIES: I have personally reviewed the radiological images as listed and agreed with the findings in the report. Dg Chest 1 View  Result Date: 05/11/2018 CLINICAL DATA:  62 y/o F; status post intubation. Recent thoracentesis. EXAM: CHEST  1 VIEW COMPARISON:  05/11/2018 chest radiograph. FINDINGS: Stable right port catheter tip projecting over mid SVC. Endotracheal tube  tip projects 2 cm above carina. Enteric tube tip extends below field of view into the abdomen. Transcutaneous pacing pads noted. Increased diffuse hazy opacities of the lungs and stable basilar consolidations. No pneumothorax. Bilateral pleural effusions. Bones are unremarkable. No pneumothorax. IMPRESSION: 1. Endotracheal tube tip 2 cm above carina. 2. Enteric tube tip below field of view in the abdomen. 3. Increased diffuse hazy opacities of the lungs and stable basilar consolidation. Bilateral effusions. Electronically Signed   By: Kristine Garbe M.D.   On: 05/11/2018 13:34   Dg Chest 1 View  Result Date: 05/11/2018 CLINICAL DATA:  Post thoracentesis now with worsening cough, shortness of breath and vomiting. EXAM: CHEST  1 VIEW COMPARISON:  Earlier same day; 05/09/2018; 05/08/2018 FINDINGS: Interval development of recurrent right basilar consolidative suspected airspace opacities now with partial obscuration of  the right heart border. Otherwise, grossly unchanged cardiac silhouette and mediastinal contours with atherosclerotic plaque when the thoracic aorta. Unchanged small to moderate size left-sided effusion associated left basilar consolidative opacities. Pulmonary vasculature remains distinct with cephalization of flow. No pneumothorax. Stable position of support apparatus. Known peripherally calcified thyroid nodule overlies the thoracic inlet. No acute osseus abnormalities. IMPRESSION: 1. Interval development of recurrent right basilar consolidative airspace opacities post recent large volume right-sided thoracentesis - differential considerations are broad though given provided history of vomiting, findings are concerning for aspiration with additional differential considerations including re-expansion alveolar pulmonary edema and (less likely) pulmonary hemorrhage. 2. Grossly unchanged small to moderate size left-sided effusion. 3. Persistent findings of pulmonary venous congestion/mild  pulmonary edema. Above findings discussed with rapid response team at the time of image acquisition. Electronically Signed   By: Sandi Mariscal M.D.   On: 05/11/2018 11:50   Dg Chest 1 View  Result Date: 05/11/2018 CLINICAL DATA:  Post right-sided thoracentesis EXAM: CHEST  1 VIEW COMPARISON:  05/09/2018 FINDINGS: Interval reduction/near resolution of residual trace right-sided effusion post thoracentesis. No pneumothorax. No change to slight increase in small to moderate size left-sided effusion. Improved aeration of lung base with persistent right basilar heterogeneous opacities. Unchanged left basilar consolidative opacities. Pulmonary vasculature remains indistinct with cephalization of flow and nodular thickening of the pulmonary interstitium. Grossly unchanged cardiac silhouette and mediastinal contours with atherosclerotic plaque within the thoracic aorta. Calcification overlying the right-sided the thoracic inlet is unchanged and compatible with known peripherally calcified thyroid nodule. Stable position of support apparatus. No acute osseus abnormalities. IMPRESSION: 1. Interval reduction/near resolution of residual trace effusion post thoracentesis. No pneumothorax. 2. No change to slight increase in small to moderate size left-sided pleural effusion. 3. Overall improved aeration of lungs with persistent findings of pulmonary venous congestion/mild pulmonary edema. Electronically Signed   By: Sandi Mariscal M.D.   On: 05/11/2018 10:44   Dg Chest 1 View  Result Date: 05/08/2018 CLINICAL DATA:  Wheezing EXAM: CHEST  1 VIEW COMPARISON:  Chest x-ray of 04/23/2017 FINDINGS: There has been increase in volume of right pleural effusion with increasing right basilar atelectasis. A smaller left pleural effusion remains with probable partial atelectasis at the left lung base as well. The heart is mildly enlarged. A Port-A-Cath is now present with the tip overlying the mid lower SVC. No pneumothorax is seen. No  bony abnormality is noted. IMPRESSION: 1. Increase in volume of right pleural effusion with increasing right basilar atelectasis. 2. Small left pleural effusion remains with probable partial atelectasis of the left lung base as well. 3. Port-A-Cath tip overlies the mid lower SVC. Electronically Signed   By: Ivar Drape M.D.   On: 05/08/2018 11:51   Dg Chest 2 View  Result Date: 05/09/2018 CLINICAL DATA:  Short of breath for 2 days. Cough yesterday but not today. Followup pleural effusion. EXAM: CHEST - 2 VIEW COMPARISON:  07/08/2018 and older exams. FINDINGS: Moderate to large right pleural effusion obscures the right hemidiaphragm and right heart border. Small to moderate left pleural effusion obscures the left hemidiaphragm. There are prominent vascular markings bilaterally without evidence of pulmonary edema. No evidence of pneumonia. No pneumothorax. Cardiac silhouette is normal in size. No mediastinal or hilar masses. Right anterior chest wall Port-A-Cath is stable. IMPRESSION: 1. Moderate to large right and small to moderate left pleural effusions similar to the previous day's study. No evidence of pneumonia or pulmonary edema. Electronically Signed   By: Dedra Skeens.D.  On: 05/09/2018 15:02   Dg Chest Port 1 View  Result Date: 05/14/2018 CLINICAL DATA:  Pleural effusion EXAM: PORTABLE CHEST 1 VIEW COMPARISON:  05/13/2018 FINDINGS: Cardiac shadow is stable. Endotracheal tube and nasogastric catheter have been removed in the interval. Right chest wall port is again seen. Thyroid calcifications are noted. Bilateral pleural effusions and basilar infiltrate is seen right greater than left relatively stable from the prior exam. No acute bony abnormality is noted. IMPRESSION: Bibasilar changes right greater than left relatively stable from the prior study. Electronically Signed   By: Inez Catalina M.D.   On: 05/14/2018 08:12   Dg Chest Port 1 View  Result Date: 05/13/2018 CLINICAL DATA:  Follow-up  pneumonia EXAM: PORTABLE CHEST 1 VIEW COMPARISON:  05/12/2018 FINDINGS: Endotracheal tube and nasogastric catheter and right-sided chest wall port are again seen and stable. Cardiac shadow is stable. Aortic calcifications are again noted. Right thyroid calcification is noted as well. Persistent bibasilar infiltrates are noted with associated effusions right greater than left. No bony abnormality is noted. IMPRESSION: Persistent bibasilar changes not significantly improved when compared with the prior exam. Electronically Signed   By: Inez Catalina M.D.   On: 05/13/2018 07:51   Dg Chest Port 1 View  Result Date: 05/12/2018 CLINICAL DATA:  Acute respiratory failure EXAM: PORTABLE CHEST 1 VIEW COMPARISON:  May 11, 2018 FINDINGS: The ETT terminates 16 mm above the carina. Recommend withdrawing 1 cm. The right Port-A-Cath is stable. The NG tube terminates below today's film. Layering effusion with underlying opacity on the left is similar in the interval. Focal infiltrate in the right lower lobe is stable. There may also be pulmonary edema. No other changes. IMPRESSION: 1. The ETT terminates 1.6 cm above the carina. Recommend withdrawing 1 cm. Other support apparatus as above. 2. Effusion and underlying opacity on the left is stable to mildly worsened. Focal infiltrate on the right is stable. Probable superimposed edema. Electronically Signed   By: Dorise Bullion III M.D   On: 05/12/2018 07:32   Ct Bone Marrow Biopsy & Aspiration  Result Date: 05/05/2018 INDICATION: Lymphoma EXAM: CT BONE MARROW BIOPSY AND ASPIRATION MEDICATIONS: None. ANESTHESIA/SEDATION: Fentanyl 75 mcg IV; Versed 2 mg IV Moderate Sedation Time:  32 minutes The patient was continuously monitored during the procedure by the interventional radiology nurse under my direct supervision. FLUOROSCOPY TIME:  Fluoroscopy Time:  minutes  seconds ( mGy). COMPLICATIONS: None immediate. PROCEDURE: Informed written consent was obtained from the patient  after a thorough discussion of the procedural risks, benefits and alternatives. All questions were addressed. Maximal Sterile Barrier Technique was utilized including caps, mask, sterile gowns, sterile gloves, sterile drape, hand hygiene and skin antiseptic. A timeout was performed prior to the initiation of the procedure. Under CT guidance, a(n) 11 gauge guide needle was advanced into the right iliac bone. Aspirates and a core were obtained. Post biopsy images demonstrate . Patient tolerated the procedure well without complication. Vital sign monitoring by nursing staff during the procedure will continue as patient is in the special procedures unit for post procedure observation. FINDINGS: The images document guide needle placement within the right iliac bone. Post biopsy images demonstrate no hemorrhage. IMPRESSION: Successful CT-guided right iliac bone marrow aspirate and core. Electronically Signed   By: Marybelle Killings M.D.   On: 05/05/2018 13:33   Ir Imaging Guided Port Insertion  Result Date: 05/07/2018 INDICATION: 62 year old female with a history of lymphoma. She has been referred for port catheter. EXAM: IMPLANTED PORT A CATH  PLACEMENT WITH ULTRASOUND AND FLUOROSCOPIC GUIDANCE MEDICATIONS: 2 g Ancef; The antibiotic was administered within an appropriate time interval prior to skin puncture. ANESTHESIA/SEDATION: Moderate (conscious) sedation was employed during this procedure. A total of Versed mg and Fentanyl 100 mcg was administered intravenously. Moderate Sedation Time: 20 minutes. The patient's level of consciousness and vital signs were monitored continuously by radiology nursing throughout the procedure under my direct supervision. FLUOROSCOPY TIME:  0 minutes, 6 seconds (1.5 mGy) COMPLICATIONS: None PROCEDURE: The procedure, risks, benefits, and alternatives were explained to the patient. Questions regarding the procedure were encouraged and answered. The patient understands and consents to the  procedure. Ultrasound survey was performed with images stored and sent to PACs. The right neck and chest was prepped with chlorhexidine, and draped in the usual sterile fashion using maximum barrier technique (cap and mask, sterile gown, sterile gloves, large sterile sheet, hand hygiene and cutaneous antiseptic). Antibiotic prophylaxis was provided with 2.0g Ancef administered IV one hour prior to skin incision. Local anesthesia was attained by infiltration with 1% lidocaine without epinephrine. Ultrasound demonstrated patency of the right internal jugular vein, and this was documented with an image. Under real-time ultrasound guidance, this vein was accessed with a 21 gauge micropuncture needle and image documentation was performed. A small dermatotomy was made at the access site with an 11 scalpel. A 0.018" wire was advanced into the SVC and used to estimate the length of the internal catheter. The access needle exchanged for a 69F micropuncture vascular sheath. The 0.018" wire was then removed and a 0.035" wire advanced into the IVC. An appropriate location for the subcutaneous reservoir was selected below the clavicle and an incision was made through the skin and underlying soft tissues. The subcutaneous tissues were then dissected using a combination of blunt and sharp surgical technique and a pocket was formed. A single lumen power injectable portacatheter was then tunneled through the subcutaneous tissues from the pocket to the dermatotomy and the port reservoir placed within the subcutaneous pocket. The venous access site was then serially dilated and a peel away vascular sheath placed over the wire. The wire was removed and the port catheter advanced into position under fluoroscopic guidance. The catheter tip is positioned in the cavoatrial junction. This was documented with a spot image. The portacatheter was then tested and found to flush and aspirate well. The port was flushed with saline and Huber needle  was left in place at the completion. The pocket was then closed in two layers using first subdermal inverted interrupted absorbable sutures followed by a running subcuticular suture. The epidermis was then sealed with Dermabond. The dermatotomy at the venous access site was also seal with Dermabond and Steri-Strips. Patient tolerated the procedure well and remained hemodynamically stable throughout. No complications encountered and no significant blood loss encountered . IMPRESSION: Status post right IJ port catheter placement. Catheter ready for use. Signed, Dulcy Fanny. Dellia Nims, RPVI [facility] Radiology Electronically Signed   By: Corrie Mckusick D.O.   On: 05/07/2018 17:19   US Thoracentesis Asp Pleural Space W/img Guide  Result Date: 05/11/2018 INDICATION: Symptomatic right sided pleural effusion. Please perform ultrasound-guided thoracentesis for therapeutic purposes. EXAM: US THORACENTESIS ASP PLEURAL SPACE W/IMG GUIDE COMPARISON:  Chest CT-04/18/2018; chest radiograph-05/09/2018 MEDICATIONS: None. COMPLICATIONS: None immediate. TECHNIQUE: Informed written consent was obtained from the patient after a discussion of the risks, benefits and alternatives to treatment. A timeout was performed prior to the initiation of the procedure. Initial ultrasound  scanning demonstrates a large anechoic right-sided pleural effusion. The lower chest was prepped and draped in the usual sterile fashion. 1% lidocaine was used for local anesthesia. An ultrasound image was saved for documentation purposes. An 8 Fr Safe-T-Centesis catheter was introduced. The thoracentesis was performed. The catheter was removed and a dressing was applied. The patient tolerated the procedure well without immediate post procedural complication. The patient was escorted to have an upright chest radiograph. FINDINGS: A total of approximately 1.7 liters of serous fluid was removed. IMPRESSION:  Successful ultrasound-guided right sided thoracentesis yielding 1.7 liters of pleural fluid. Electronically Signed   By: Sandi Mariscal M.D.   On: 05/11/2018 10:45    ASSESSMENT & PLAN:   62 y.o. 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 S/P C1 OF CHOP on 05/08/2018  2. General LNadenopathy from lymphoma with b/l pleural effusion and b/l lower extremity weakness  3. 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  4. 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 LDH wnl no evidnece of overt hemolysis at this timne   5. CMV IgM+ --  Component     Latest Ref Rng & Units 05/09/2018  CMV Quant DNA PCR (Plasma)     Negative IU/mL 8,430  Log10 CMV Qn DNA Pl     log10 IU/mL 3.926    6. High risk for TLS - on allopurinol , received Rasburicase. Uric acid normalized Uric acid 16--->8--> 4.6-->3.4--> 3.6  7. Port a cath in situ  8. 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 S/p Acute hypoxic respiratory failure - required intubation --now extubated and with minimal oxygen needs.  ?etiology Re-expansion pulmonary edema post large volume thoracentesis vs aspiration Though the rapidity of presentation is unusual would need to consider HCAP and CMV pneumonitis (CMV IgM+ and PCV positive with worsening). Resolved hypoxia - treated with antibiotics for aspiration pneumonia.  10 . RUE DVT - US 6/25 -- was briefly on IV Heparin but held given thrombocytopenia -- currently on Eliquis  11.. Hypokalemia K 3 --> likely from diuretics in the hospital + Poor po intake + high dose steroids -prescription sent to her pharmacy.  PLAN:  -Discussed pt labwork today, 06/04/18; HGB at 6.8, ANC at 500 -Will order 2 units PRBCs -Advised that the pt abide by infection-prevention strategies  like crowd avoidance and wearing a mask in public  -Will refer pt to our nutritional therapist -Brentuximab-CHP with G-CSF support s/p C2 -transfuse PRBC prn for hgb <8 -transfuse platelets for bleeding or prophylactically if <20k (in setting of infection/sepsis) -continue Eliquis for anticoagulation for DVT with platelets>50k -continue with home health services and home PT/OT -Recommend that the pt continue optimizing nutritional status and activity levels. -discussed optimizing po intake  Labs in 1 week 2 units of PRBC today 1Unit of PRBC with labs in 1 week RTC with Dr Irene Limbo in 2 weeks with C3D1 of treatment on 7/31 Please schedule C3 of treatment in 2 weeks   All of the patients questions were answered with apparent satisfaction. The patient knows to call the clinic with any problems, questions or concerns.  The total time spent in the appt was 25 minutes and more than 50% was on counseling and direct patient cares.    Sullivan Lone MD MS AAHIVMS [facility] Hematology/Oncology Physician [facility]  (  Office):       [phone] (Work cell):  [phone] (Fax):           [phone]  I, Baldwin Jamaica, am acting as a scribe for Dr Irene Limbo.   .I have reviewed the above documentation for accuracy and completeness, and I agree with the above. Brunetta Genera MD

## 2018-06-04 ENCOUNTER — Telehealth: Payer: Self-pay

## 2018-06-04 ENCOUNTER — Inpatient Hospital Stay: Payer: Self-pay

## 2018-06-04 ENCOUNTER — Other Ambulatory Visit: Payer: Self-pay

## 2018-06-04 ENCOUNTER — Inpatient Hospital Stay (HOSPITAL_BASED_OUTPATIENT_CLINIC_OR_DEPARTMENT_OTHER): Payer: Self-pay | Admitting: Hematology

## 2018-06-04 ENCOUNTER — Other Ambulatory Visit: Payer: Self-pay | Admitting: *Deleted

## 2018-06-04 ENCOUNTER — Encounter: Payer: Self-pay | Admitting: Hematology

## 2018-06-04 VITALS — BP 85/45 | HR 116 | Temp 98.1°F | Resp 18 | Ht 61.81 in | Wt 102.7 lb

## 2018-06-04 DIAGNOSIS — E876 Hypokalemia: Secondary | ICD-10-CM

## 2018-06-04 DIAGNOSIS — I82629 Acute embolism and thrombosis of deep veins of unspecified upper extremity: Secondary | ICD-10-CM

## 2018-06-04 DIAGNOSIS — D702 Other drug-induced agranulocytosis: Secondary | ICD-10-CM

## 2018-06-04 DIAGNOSIS — C8440 Peripheral T-cell lymphoma, not classified, unspecified site: Secondary | ICD-10-CM

## 2018-06-04 DIAGNOSIS — D649 Anemia, unspecified: Secondary | ICD-10-CM

## 2018-06-04 DIAGNOSIS — C865 Angioimmunoblastic T-cell lymphoma: Secondary | ICD-10-CM

## 2018-06-04 DIAGNOSIS — E440 Moderate protein-calorie malnutrition: Secondary | ICD-10-CM

## 2018-06-04 LAB — SAMPLE TO BLOOD BANK

## 2018-06-04 LAB — CMP (CANCER CENTER ONLY)
ALBUMIN: 3.6 g/dL (ref 3.5–5.0)
ALT: 40 U/L (ref 0–44)
ANION GAP: 7 (ref 5–15)
AST: 28 U/L (ref 15–41)
Alkaline Phosphatase: 104 U/L (ref 38–126)
BILIRUBIN TOTAL: 1 mg/dL (ref 0.3–1.2)
BUN: 12 mg/dL (ref 8–23)
CALCIUM: 9.9 mg/dL (ref 8.9–10.3)
CO2: 25 mmol/L (ref 22–32)
Chloride: 96 mmol/L — ABNORMAL LOW (ref 98–111)
Creatinine: 0.78 mg/dL (ref 0.44–1.00)
GLUCOSE: 197 mg/dL — AB (ref 70–99)
Potassium: 5.1 mmol/L (ref 3.5–5.1)
SODIUM: 128 mmol/L — AB (ref 135–145)
TOTAL PROTEIN: 7.3 g/dL (ref 6.5–8.1)

## 2018-06-04 LAB — CBC WITH DIFFERENTIAL/PLATELET
BASOS ABS: 0 10*3/uL (ref 0.0–0.1)
BASOS PCT: 3 %
Eosinophils Absolute: 0 10*3/uL (ref 0.0–0.5)
Eosinophils Relative: 1 %
HEMATOCRIT: 20.2 % — AB (ref 34.8–46.6)
Hemoglobin: 6.8 g/dL — CL (ref 11.6–15.9)
Lymphocytes Relative: 45 %
Lymphs Abs: 0.5 10*3/uL — ABNORMAL LOW (ref 0.9–3.3)
MCH: 26.7 pg (ref 25.1–34.0)
MCHC: 33.7 g/dL (ref 31.5–36.0)
MCV: 79.2 fL — ABNORMAL LOW (ref 79.5–101.0)
MONO ABS: 0 10*3/uL — AB (ref 0.1–0.9)
Monocytes Relative: 1 %
NEUTROS ABS: 0.5 10*3/uL — AB (ref 1.5–6.5)
NEUTROS PCT: 50 %
Platelets: 151 10*3/uL (ref 145–400)
RBC: 2.55 MIL/uL — ABNORMAL LOW (ref 3.70–5.45)
RDW: 14.9 % — AB (ref 11.2–14.5)
WBC: 1 10*3/uL — ABNORMAL LOW (ref 3.9–10.3)

## 2018-06-04 LAB — LACTATE DEHYDROGENASE: LDH: 194 U/L — ABNORMAL HIGH (ref 98–192)

## 2018-06-04 LAB — PREPARE RBC (CROSSMATCH)

## 2018-06-04 LAB — URIC ACID: Uric Acid, Serum: 3.6 mg/dL (ref 2.5–7.1)

## 2018-06-04 MED ORDER — SODIUM CHLORIDE 0.9% FLUSH
3.0000 mL | INTRAVENOUS | Status: AC | PRN
  Administered 2018-06-04: 3 mL
  Filled 2018-06-04: qty 10

## 2018-06-04 MED ORDER — HEPARIN SOD (PORK) LOCK FLUSH 100 UNIT/ML IV SOLN
250.0000 [IU] | INTRAVENOUS | Status: AC | PRN
  Administered 2018-06-04: 250 [IU]
  Filled 2018-06-04: qty 5

## 2018-06-04 MED ORDER — ACETAMINOPHEN 325 MG PO TABS
650.0000 mg | ORAL_TABLET | Freq: Once | ORAL | Status: AC
  Administered 2018-06-04: 650 mg via ORAL

## 2018-06-04 MED ORDER — DIPHENHYDRAMINE HCL 25 MG PO CAPS
25.0000 mg | ORAL_CAPSULE | Freq: Once | ORAL | Status: AC
  Administered 2018-06-04: 25 mg via ORAL

## 2018-06-04 MED ORDER — ACETAMINOPHEN 325 MG PO TABS
ORAL_TABLET | ORAL | Status: AC
Start: 2018-06-04 — End: ?
  Filled 2018-06-04: qty 2

## 2018-06-04 MED ORDER — DIPHENHYDRAMINE HCL 25 MG PO CAPS
ORAL_CAPSULE | ORAL | Status: AC
  Filled 2018-06-04: qty 1

## 2018-06-04 MED ORDER — LIDOCAINE-PRILOCAINE 2.5-2.5 % EX CREA: TOPICAL_CREAM | CUTANEOUS | 2 refills | Status: DC

## 2018-06-04 NOTE — Telephone Encounter (Signed)
Called Deborah Cooley to let her know she could come in earlier than 1 pm for her blood transfusion.  Seth Bake will be taking care of her in the clinic. Gardiner Rhyme

## 2018-06-04 NOTE — Patient Instructions (Signed)
Blood Transfusion, Adult, Care After This sheet gives you information about how to care for yourself after your procedure. Your health care provider may also give you more specific instructions. If you have problems or questions, contact your health care provider. What can I expect after the procedure? After your procedure, it is common to have:  Bruising and soreness where the IV tube was inserted.  Headache.  Follow these instructions at home:  Take over-the-counter and prescription medicines only as told by your health care provider.  Return to your normal activities as told by your health care provider.  Follow instructions from your health care provider about how to take care of your IV insertion site. Make sure you: ? Wash your hands with soap and water before you change your bandage (dressing). If soap and water are not available, use hand sanitizer. ? Change your dressing as told by your health care provider.  Check your IV insertion site every day for signs of infection. Check for: ? More redness, swelling, or pain. ? More fluid or blood. ? Warmth. ? Pus or a bad smell. Contact a health care provider if:  You have more redness, swelling, or pain around the IV insertion site.  You have more fluid or blood coming from the IV insertion site.  Your IV insertion site feels warm to the touch.  You have pus or a bad smell coming from the IV insertion site.  Your urine turns pink, red, or brown.  You feel weak after doing your normal activities. Get help right away if:  You have signs of a serious allergic or immune system reaction, including: ? Itchiness. ? Hives. ? Trouble breathing. ? Anxiety. ? Chest or lower back pain. ? Fever, flushing, and chills. ? Rapid pulse. ? Rash. ? Diarrhea. ? Vomiting. ? Dark urine. ? Serious headache. ? Dizziness. ? Stiff neck. ? Yellow coloration of the face or the white parts of the eyes (jaundice). This information is not  intended to replace advice given to you by your health care provider. Make sure you discuss any questions you have with your health care provider. Document Released: 11/26/2014 Document Revised: 07/04/2016 Document Reviewed: 05/21/2016 Elsevier Interactive Patient Education  2018 Elsevier Inc.  

## 2018-06-05 LAB — TYPE AND SCREEN
ABO/RH(D): O POS
ANTIBODY SCREEN: NEGATIVE
Unit division: 0
Unit division: 0

## 2018-06-05 LAB — BPAM RBC
BLOOD PRODUCT EXPIRATION DATE: 201908142359
Blood Product Expiration Date: 201908142359
ISSUE DATE / TIME: 201907171328
ISSUE DATE / TIME: 201907171601
UNIT TYPE AND RH: 5100
Unit Type and Rh: 5100

## 2018-06-06 LAB — CMV DNA, QUANTITATIVE, PCR
CMV DNA QUANT: 203 [IU]/mL
LOG10 CMV QN DNA PL: 2.307 {Log_IU}/mL

## 2018-06-09 ENCOUNTER — Telehealth: Payer: Self-pay

## 2018-06-09 NOTE — Telephone Encounter (Signed)
Spoke with patient concerning her upcoming changes to her current schedule. per 7/22 follow up

## 2018-06-10 ENCOUNTER — Other Ambulatory Visit: Payer: Self-pay

## 2018-06-10 DIAGNOSIS — C8440 Peripheral T-cell lymphoma, not classified, unspecified site: Secondary | ICD-10-CM

## 2018-06-11 ENCOUNTER — Telehealth: Payer: Self-pay

## 2018-06-11 ENCOUNTER — Inpatient Hospital Stay: Payer: Self-pay

## 2018-06-11 DIAGNOSIS — C8440 Peripheral T-cell lymphoma, not classified, unspecified site: Secondary | ICD-10-CM

## 2018-06-11 LAB — CBC WITH DIFFERENTIAL (CANCER CENTER ONLY)
Basophils Absolute: 0 10*3/uL (ref 0.0–0.1)
Basophils Relative: 0 %
Eosinophils Absolute: 0 10*3/uL (ref 0.0–0.5)
Eosinophils Relative: 0 %
HEMATOCRIT: 27.2 % — AB (ref 34.8–46.6)
HEMOGLOBIN: 9.3 g/dL — AB (ref 11.6–15.9)
LYMPHS PCT: 32 %
Lymphs Abs: 0.7 10*3/uL — ABNORMAL LOW (ref 0.9–3.3)
MCH: 27.9 pg (ref 25.1–34.0)
MCHC: 34.2 g/dL (ref 31.5–36.0)
MCV: 81.7 fL (ref 79.5–101.0)
MONOS PCT: 26 %
Monocytes Absolute: 0.6 10*3/uL (ref 0.1–0.9)
NEUTROS ABS: 1 10*3/uL — AB (ref 1.5–6.5)
NEUTROS PCT: 42 %
Platelet Count: 247 10*3/uL (ref 145–400)
RBC: 3.33 MIL/uL — ABNORMAL LOW (ref 3.70–5.45)
RDW: 15.9 % — ABNORMAL HIGH (ref 11.2–14.5)
WBC Count: 2.3 10*3/uL — ABNORMAL LOW (ref 3.9–10.3)

## 2018-06-11 LAB — SAMPLE TO BLOOD BANK

## 2018-06-11 NOTE — Telephone Encounter (Signed)
Pt not within parameters to receive blood or platelets today per last OV note from Dr. Irene Limbo. PRBC transfusion for Hgb<8.0 and platelet transfusion for platelets<20k. Pt labs today Hgb 9.3 and plt 247. Pt asymptomatic. Pt given appointments and labs.

## 2018-06-16 NOTE — Progress Notes (Signed)
HEMATOLOGY/ONCOLOGY CLINIC NOTE  Date of Service: 06/17/2018  Patient Care Team: Patient, No Pcp Per as PCP - General ([place]) [name], MD (Unknown Physician Specialty)  CHIEF COMPLAINTS/PURPOSE OF CONSULTATION:  Newly diagnosed angioimmunoblastic T cell lymphoma  HISTORY OF PRESENTING ILLNESS:   Deborah Cooley is a wonderful 62 y.o. female who has been referred to [place] by Dr Joni Reining for evaluation and management of her newly diagnosed angioimmunoblastic T cell lymphoma.   Patient has a h/o HTN, endometriosis, tobacco use who was admitted to the hospital with shortness of breath and was subsequently found to have diffuse lymphadenopathy. She was also found to have bilateral pleural effusions and has had a thoracentesis with pleural fluid demonstrating atypical lymphocytosis. She was discharged but returned to the ED with complaints of persisting SOB, abdominal swelling, and lower extremity swelling.   She has had a  CTA chest and CT abd/pelvis which showed no PE.but extensive LNadenopathy in the chest and throughout the abd with b/l pleural effusion and mild ascites. Also noted to have narrowing in b/l common iliac arteries.  Clinically she is noted to have b/l LE edema 1-2+  Most recent lab results (05/05/18) of CBC  is as follows: all values are WNL except for RBC at 2.59, HGB at 7.7, HCT at 22.7, RDW at 16.0, PLT at 89k.  She has had a CT bone marrow biopsy on 05/05/2018 -results pending. ECHO was done and showed  Systolic function was   normal. The estimated ejection fraction was in the range of 60%   to 65%. Wall motion was normal; there were no regional wall   motion abnormalities. Left ventricular diastolic function   parameters were normal.  Port a cath placement requested.  Patient was transferred to [facility] with her consent to receive C1 of chemotherapy as inpatient.  Interval History:   Deborah Cooley returns today for management and  evaluation of her Angioimmunoblastic T cell lymphoma prior to C3. The patient's last visit with [place] was on 06/04/18. She is accompanied today by her husband. The pt reports that she is doing well overall.   The pt reports that her appetite has been strengthening as her taste has returned. She notes that her leg swelling is resolved and her breathing has been normal. She has been staying active, eating more, and has gained three pounds.   She notes that she has continued to be off of Allopurinol. She does not have a refill for her Eliquis, she filled out necessary paperwork to get a more feasible cost of eliquis, which hasn't been approved yet.   Lab results today (06/17/18) of CBC w/diff, CMP is as follows: all values are WNL except for WBC at 3.3k, RBC at 2.84, HGB at 7.9, HCT at 23.1, RDW at 15.4, ANC at 1.1k, Glucose at 136, BUN at 7. Magnesium 06/17/18 is WNL at 1.9 Phosphorous 06/17/18 is WNL at 3.8  On review of systems, pt reports weight gain, stronger appetite, resolved leg swelling, and denies difficulty breathing, fatigue, light headedness, dizziness, abdominal pains, and any other symptoms.    MEDICAL HISTORY:  Past Medical History:  Diagnosis Date  . Bilateral pleural effusion 04/17/2018  . Endometriosis   . Essential hypertension, benign   . Family history of adverse reaction to anesthesia    "sister stops breathing I think" (04/18/2018)  . Heart murmur   . Mixed hyperlipidemia   . Pneumonia 04/17/2018    SURGICAL HISTORY: Past Surgical History:  Procedure Laterality  Date  . ABDOMINAL HYSTERECTOMY  1970s/1980s   for endometriosis  . Arthroscopic right shoulder surgery    . AXILLARY LYMPH NODE BIOPSY Left 04/21/2018   Procedure: LEFT AXILLARY LYMPH NODE BIOPSY;  Surgeon: Kieth Brightly, Arta Bruce, MD;  Location: [place];  Service: General;  Laterality: Left;  . CARPAL TUNNEL RELEASE Right   . GANGLION CYST EXCISION Right   . IR IMAGING GUIDED PORT INSERTION  05/07/2018  . MULTIPLE  TOOTH EXTRACTIONS    . OOPHORECTOMY  1997  . PARTIAL HYSTERECTOMY  1984  . Right hand surgery    . SHOULDER OPEN ROTATOR CUFF REPAIR Right   . VESICOVAGINAL FISTULA CLOSURE W/ TAH      SOCIAL HISTORY: Social History   Socioeconomic History  . Marital status: Married    Spouse name: Not on file  . Number of children: Not on file  . Years of education: Not on file  . Highest education level: Not on file  Occupational History  . Occupation: Social research officer, government    Comment: works at [employer]  . Financial resource strain: Not on file  . Food insecurity:    Worry: Not on file    Inability: Not on file  . Transportation needs:    Medical: Not on file    Non-medical: Not on file  Tobacco Use  . Smoking status: Current Every Day Smoker    Packs/day: 1.50    Years: 46.00    Pack years: 69.00    Types: Cigarettes  . Smokeless tobacco: Never Used  Substance and Sexual Activity  . Alcohol use: Not Currently    Comment: 04/18/2018 "quit years ago"  . Drug use: Not Currently    Comment: "back in my 57s"  . Sexual activity: Not Currently  Lifestyle  . Physical activity:    Days per week: Not on file    Minutes per session: Not on file  . Stress: Not on file  Relationships  . Social connections:    Talks on phone: Not on file    Gets together: Not on file    Attends religious service: Not on file    Active member of club or organization: Not on file    Attends meetings of clubs or organizations: Not on file    Relationship status: Not on file  . Intimate partner violence:    Fear of current or ex partner: Not on file    Emotionally abused: Not on file    Physically abused: Not on file    Forced sexual activity: Not on file  Other Topics Concern  . Not on file  Social History Narrative   ** Merged History Encounter **       Patient has one son whom is healthy.   Has a live in boyfriend.    FAMILY HISTORY: Family History  Problem Relation Age of  Onset  . Diabetes Mother        age 62  . Heart attack Father        died age 59's  . Hypertension Father   . Cancer Sister        breast cancer diagonsed age 29 years  . Diabetes Unknown   . Heart disease Unknown        Female <55  . Arthritis Unknown   . Asthma Unknown   . Hyperlipidemia Other        several siblings  . Thyroid disease Sister  one sister with thyroid disease    ALLERGIES:  is allergic to ambien [zolpidem].  MEDICATIONS:  Current Outpatient Medications  Medication Sig Dispense Refill  . acetaminophen (TYLENOL) 325 MG tablet Take 2 tablets (650 mg total) by mouth every 6 (six) hours as needed for mild pain (or Fever >/= 101).    Marland Kitchen lidocaine-prilocaine (EMLA) cream Apply a dime size to port-a-cath 1-2 hours prior to access. Cover with saran wrap. 30 g 2  . LORazepam (ATIVAN) 0.5 MG tablet Take 1 tablet (0.5 mg total) by mouth every 8 (eight) hours as needed for anxiety. 30 tablet 0  . Multiple Vitamin (MULTIVITAMIN WITH MINERALS) TABS tablet Take 1 tablet by mouth daily. 30 tablet 0  . ondansetron (ZOFRAN) 8 MG tablet Take 1 tablet (8 mg total) by mouth every 8 (eight) hours as needed for nausea. Start on day 3 after chemotherapy 30 tablet 3  . polyethylene glycol (MIRALAX / GLYCOLAX) packet Take 17 g by mouth daily as needed for mild constipation. 30 each 0  . potassium chloride SA (K-DUR,KLOR-CON) 20 MEQ tablet 40 meq orally twice daily for 2 daily then 69mq PO daily 30 tablet 0  . predniSONE (DELTASONE) 20 MG tablet Take 3 tablets (60 mg total) by mouth daily. Take on days 1-5 of chemotherapy. (Patient taking differently: Take 10 mg by mouth daily. Take on days 1-5 of chemotherapy.) 15 tablet 6  . prochlorperazine (COMPAZINE) 10 MG tablet Take 1 tablet (10 mg total) by mouth every 6 (six) hours as needed for nausea or vomiting. 30 tablet 1  . apixaban (ELIQUIS) 2.5 MG TABS tablet Take 1 tablet (2.5 mg total) by mouth 2 (two) times daily. 60 tablet 4   No  current facility-administered medications for this visit.     REVIEW OF SYSTEMS:    A 10+ POINT REVIEW OF SYSTEMS WAS OBTAINED including neurology, dermatology, psychiatry, cardiac, respiratory, lymph, extremities, GI, GU, Musculoskeletal, constitutional, breasts, reproductive, HEENT.  All pertinent positives are noted in the HPI.  All others are negative.   PHYSICAL EXAMINATION: ECOG PERFORMANCE STATUS: 2 - Symptomatic, <50% confined to bed  . Vitals:   06/17/18 0823  BP: (!) 108/42  Pulse: 93  Resp: 17  Temp: 98.5 F (36.9 C)  SpO2: 100%   Filed Weights   06/17/18 0823  Weight: 105 lb 3.2 oz (47.7 kg)   .Body mass index is 19.36 kg/m.  GENERAL:alert, in no acute distress and comfortable SKIN: no acute rashes, no significant lesions EYES: conjunctival pallor +, sclera anicteric OROPHARYNX: MMM, no exudates, no oropharyngeal erythema or ulceration NECK: supple, no JVD LYMPH:  no palpable lymphadenopathy in the cervical, axillary or inguinal regions LUNGS: decreased breath sounds 1/5 up right HEART: regular rate & rhythm ABDOMEN:  normoactive bowel sounds , non tender, not distended. No palpable hepatosplenomegaly.  Extremity: no pedal edema PSYCH: alert & oriented x 3 with fluent speech NEURO: no focal motor/sensory deficits    LABORATORY DATA:  I have reviewed the data as listed  . CBC Latest Ref Rng & Units 06/17/2018 06/11/2018 06/04/2018  WBC 3.9 - 10.3 K/uL 3.3(L) 2.3(L) 1.0(L)  Hemoglobin 11.6 - 15.9 g/dL 7.9(L) 9.3(L) 6.8(LL)  Hematocrit 34.8 - 46.6 % 23.1(L) 27.2(L) 20.2(L)  Platelets 145 - 400 K/uL 220 247 151   . CBC    Component Value Date/Time   WBC 3.3 (L) 06/17/2018 0747   RBC 2.84 (L) 06/17/2018 0747   HGB 7.9 (L) 06/17/2018 0747   HGB 9.3 (L) 06/11/2018  0859   HCT 23.1 (L) 06/17/2018 0747   PLT 220 06/17/2018 0747   PLT 247 06/11/2018 0859   MCV 81.3 06/17/2018 0747   MCH 27.8 06/17/2018 0747   MCHC 34.2 06/17/2018 0747   RDW 15.4 (H)  06/17/2018 0747   LYMPHSABS 1.3 06/17/2018 0747   MONOABS 0.9 06/17/2018 0747   EOSABS 0.0 06/17/2018 0747   BASOSABS 0.0 06/17/2018 0747    . CMP Latest Ref Rng & Units 06/17/2018 06/04/2018 05/27/2018  Glucose 70 - 99 mg/dL 136(H) 197(H) 126(H)  BUN 8 - 23 mg/dL 7(L) 12 10  Creatinine 0.44 - 1.00 mg/dL 0.66 0.78 0.62  Sodium 135 - 145 mmol/L 141 128(L) 134(L)  Potassium 3.5 - 5.1 mmol/L 4.4 5.1 3.0(LL)  Chloride 98 - 111 mmol/L 105 96(L) 97(L)  CO2 22 - 32 mmol/L '27 25 31  ' Calcium 8.9 - 10.3 mg/dL 9.5 9.9 9.4  Total Protein 6.5 - 8.1 g/dL 7.1 7.3 6.7  Total Bilirubin 0.3 - 1.2 mg/dL 0.5 1.0 0.9  Alkaline Phos 38 - 126 U/L 77 104 64  AST 15 - 41 U/L '18 28 24  ' ALT 0 - 44 U/L 18 40 20    . Lab Results  Component Value Date   LDH 194 (H) 06/04/2018      Component     Latest Ref Rng & Units 04/18/2018 05/03/2018  Hepatitis B Surface Ag     Negative  Negative  HCV Ab     0.0 - 0.9 s/co ratio  <0.1  Hep A Ab, IgM     Negative  Negative  Hep B Core Ab, IgM     Negative  Negative  HIV Screen 4th Generation wRfx     Non Reactive Non Reactive     04/21/18 Molecular Pathology:   04/21/18 Surgical Pathology:    RADIOGRAPHIC STUDIES: I have personally reviewed the radiological images as listed and agreed with the findings in the report. No results found.  ASSESSMENT & PLAN:   62 y.o. female with  1. 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 S/P C1 OF CHOP on 05/08/2018 S/p C2 of Brentuximab-CHP on 05/28/2018  2. General LNadenopathy from lymphoma with b/l pleural effusion and b/l lower extremity weakness  3. 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  4. 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 LDH wnl no evidnece of overt hemolysis at this timne   5. CMV IgM+ --  Component     Latest Ref Rng & Units 05/09/2018  CMV Quant DNA PCR  (Plasma)     Negative IU/mL 8,430  Log10 CMV Qn DNA Pl     log10 IU/mL 3.926    6. High risk for TLS - on allopurinol , received Rasburicase. Uric acid normalized Uric acid 16--->8--> 4.6-->3.4--> 3.6  7. Port a cath in situ  8. 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 S/p Acute hypoxic respiratory failure - required intubation --now extubated and with minimal oxygen needs.  ?etiology Re-expansion pulmonary edema post large volume thoracentesis vs aspiration Though the rapidity of presentation is unusual would need to consider HCAP and CMV pneumonitis (CMV IgM+ and PCV positive with worsening). Resolved hypoxia - treated with antibiotics for aspiration pneumonia.  10 . RUE DVT - US 6/25 -- was briefly on IV Heparin but held given thrombocytopenia -- currently on  Eliquis  11.. Hypokalemia K 3 --> likely from diuretics in the hospital + Poor po intake + high dose steroids -prescription sent to her pharmacy.  PLAN: -Will refer pt to our nutritional therapist -Brentuximab-CHP with G-CSF support s/p C2 -transfuse PRBC prn for hgb <8 -transfuse platelets for bleeding or prophylactically if <20k (in setting of infection/sepsis) -continue Eliquis for anticoagulation for DVT with platelets>50k -continue with home health services and home PT/OT -I recommend that she received 1 unit of PRBCs- ordering this -Ordering PET/CT to be scheduled at the end of C3 -The pt has no prohibitive toxicities from continuing CHP with Brentuximab instead of Vincristine at this time.   -Advised that the pt continue to be as physically active as reasonably tolerated, continue optimizing PO intake, and stay well hydrated with at least 48-64 oz of water each day -Continue with Neulasta injection in two days on 06/19/18 -Will see pt back in 10 days for toxicity check   F/u for C3 ctx tomorrow and neulasta on 8/1 as per schedule PRBC 1  unit today Labs with RTC in 10 days PET/CT in 18 days RTC with labs/MD/CTX for C4 as per schedule/orders    All of the patients questions were answered with apparent satisfaction. The patient knows to call the clinic with any problems, questions or concerns.  The total time spent in the appt was 25 minutes and more than 50% was on counseling and direct patient cares.    Sullivan Lone MD MS AAHIVMS [facility] Hematology/Oncology Physician [facility]  (Office):       [phone] (Work cell):  [phone] (Fax):           [phone]  I, Baldwin Jamaica, am acting as a scribe for Dr. Irene Limbo  .I have reviewed the above documentation for accuracy and completeness, and I agree with the above. Brunetta Genera MD

## 2018-06-17 ENCOUNTER — Inpatient Hospital Stay: Payer: Self-pay

## 2018-06-17 ENCOUNTER — Ambulatory Visit (HOSPITAL_COMMUNITY)
Admission: RE | Admit: 2018-06-17 | Discharge: 2018-06-17 | Disposition: A | Discharge status: Home / Self Care | Payer: Self-pay | Source: Ambulatory Visit | Attending: Family Medicine | Admitting: Family Medicine

## 2018-06-17 ENCOUNTER — Encounter: Payer: Self-pay | Admitting: Hematology

## 2018-06-17 ENCOUNTER — Other Ambulatory Visit: Payer: Self-pay

## 2018-06-17 ENCOUNTER — Inpatient Hospital Stay (HOSPITAL_BASED_OUTPATIENT_CLINIC_OR_DEPARTMENT_OTHER): Payer: Self-pay | Admitting: Hematology

## 2018-06-17 VITALS — BP 108/42 | HR 93 | Temp 98.5°F | Resp 17 | Ht 61.81 in | Wt 105.2 lb

## 2018-06-17 DIAGNOSIS — D702 Other drug-induced agranulocytosis: Secondary | ICD-10-CM

## 2018-06-17 DIAGNOSIS — C865 Angioimmunoblastic T-cell lymphoma: Secondary | ICD-10-CM | POA: Insufficient documentation

## 2018-06-17 DIAGNOSIS — C8440 Peripheral T-cell lymphoma, not classified, unspecified site: Secondary | ICD-10-CM

## 2018-06-17 DIAGNOSIS — D649 Anemia, unspecified: Secondary | ICD-10-CM

## 2018-06-17 DIAGNOSIS — E876 Hypokalemia: Secondary | ICD-10-CM

## 2018-06-17 DIAGNOSIS — I82629 Acute embolism and thrombosis of deep veins of unspecified upper extremity: Secondary | ICD-10-CM

## 2018-06-17 DIAGNOSIS — I10 Essential (primary) hypertension: Secondary | ICD-10-CM

## 2018-06-17 DIAGNOSIS — F17210 Nicotine dependence, cigarettes, uncomplicated: Secondary | ICD-10-CM

## 2018-06-17 DIAGNOSIS — D696 Thrombocytopenia, unspecified: Secondary | ICD-10-CM

## 2018-06-17 LAB — CBC WITH DIFFERENTIAL/PLATELET
Basophils Absolute: 0 10*3/uL (ref 0.0–0.1)
Basophils Relative: 1 %
EOS PCT: 0 %
Eosinophils Absolute: 0 10*3/uL (ref 0.0–0.5)
HEMATOCRIT: 23.1 % — AB (ref 34.8–46.6)
HEMOGLOBIN: 7.9 g/dL — AB (ref 11.6–15.9)
LYMPHS PCT: 39 %
Lymphs Abs: 1.3 10*3/uL (ref 0.9–3.3)
MCH: 27.8 pg (ref 25.1–34.0)
MCHC: 34.2 g/dL (ref 31.5–36.0)
MCV: 81.3 fL (ref 79.5–101.0)
Monocytes Absolute: 0.9 10*3/uL (ref 0.1–0.9)
Monocytes Relative: 27 %
Neutro Abs: 1.1 10*3/uL — ABNORMAL LOW (ref 1.5–6.5)
Neutrophils Relative %: 33 %
PLATELETS: 220 10*3/uL (ref 145–400)
RBC: 2.84 MIL/uL — AB (ref 3.70–5.45)
RDW: 15.4 % — ABNORMAL HIGH (ref 11.2–14.5)
WBC: 3.3 10*3/uL — AB (ref 3.9–10.3)

## 2018-06-17 LAB — CMP (CANCER CENTER ONLY)
ALK PHOS: 77 U/L (ref 38–126)
ALT: 18 U/L (ref 0–44)
ANION GAP: 9 (ref 5–15)
AST: 18 U/L (ref 15–41)
Albumin: 3.5 g/dL (ref 3.5–5.0)
BILIRUBIN TOTAL: 0.5 mg/dL (ref 0.3–1.2)
BUN: 7 mg/dL — ABNORMAL LOW (ref 8–23)
CHLORIDE: 105 mmol/L (ref 98–111)
CO2: 27 mmol/L (ref 22–32)
CREATININE: 0.66 mg/dL (ref 0.44–1.00)
Calcium: 9.5 mg/dL (ref 8.9–10.3)
Glucose, Bld: 136 mg/dL — ABNORMAL HIGH (ref 70–99)
POTASSIUM: 4.4 mmol/L (ref 3.5–5.1)
Sodium: 141 mmol/L (ref 135–145)
Total Protein: 7.1 g/dL (ref 6.5–8.1)

## 2018-06-17 LAB — PHOSPHORUS: PHOSPHORUS: 3.8 mg/dL (ref 2.5–4.6)

## 2018-06-17 LAB — MAGNESIUM: MAGNESIUM: 1.9 mg/dL (ref 1.7–2.4)

## 2018-06-17 LAB — PREPARE RBC (CROSSMATCH)

## 2018-06-17 MED ORDER — SODIUM CHLORIDE 0.9% IV SOLUTION
250.0000 mL | Freq: Once | INTRAVENOUS | Status: AC
  Administered 2018-06-17: 250 mL via INTRAVENOUS

## 2018-06-17 MED ORDER — HEPARIN SOD (PORK) LOCK FLUSH 100 UNIT/ML IV SOLN
500.0000 [IU] | Freq: Every day | INTRAVENOUS | Status: AC | PRN
  Administered 2018-06-17: 500 [IU]
  Filled 2018-06-17: qty 5

## 2018-06-17 MED ORDER — ACETAMINOPHEN 325 MG PO TABS
650.0000 mg | ORAL_TABLET | Freq: Once | ORAL | Status: AC
  Administered 2018-06-17: 650 mg via ORAL
  Filled 2018-06-17: qty 2

## 2018-06-17 MED ORDER — SODIUM CHLORIDE 0.9% FLUSH
10.0000 mL | INTRAVENOUS | Status: AC | PRN
  Administered 2018-06-17: 10 mL

## 2018-06-17 MED ORDER — DIPHENHYDRAMINE HCL 25 MG PO CAPS
25.0000 mg | ORAL_CAPSULE | Freq: Once | ORAL | Status: AC
  Administered 2018-06-17: 25 mg via ORAL
  Filled 2018-06-17: qty 1

## 2018-06-17 MED ORDER — APIXABAN 5 MG PO TABS: 5.0000 mg | ORAL_TABLET | Freq: Two times a day (BID) | ORAL | 4 refills | Status: DC

## 2018-06-17 MED ORDER — APIXABAN 2.5 MG PO TABS: 2.5000 mg | ORAL_TABLET | Freq: Two times a day (BID) | ORAL | 4 refills | Status: DC

## 2018-06-17 MED FILL — ELIQUIS 2.5 MG TABLET: 2.5 | 30 days supply | Qty: 60 | Fill #0

## 2018-06-17 NOTE — Progress Notes (Signed)
Patient walked in and states she cannot afford Eliquis and is almost out of samples. Screened patient for one-time $400 [employer]. Based on verbal information provided, patient does qualify. Called [facility] pharmacy to obtain pricing to be sure it would cover. Received that confirmation. Advised patient to bring proof of income tomorrow for herself and spouse for grant approval. Gave her my card with my contact information. She verbalized understanding.  Called Margarita Grizzle desk RN to have rx sent to [place] so that patient may be able to get tomorrwow.

## 2018-06-17 NOTE — Progress Notes (Signed)
PATIENT CARE CENTER NOTE  Diagnosis: Iron Deficiency Anemia    Provider: Dr. Irene Limbo   Procedure: 1 unit PRBC's  Note: Patient received 1 unit of blood. Tolerated transfusion well with no adverse reaction. Vital signs remained stable. Discharge instructions given to patient. Patient alert, oriented and ambulatory at discharge.

## 2018-06-17 NOTE — Discharge Instructions (Signed)

## 2018-06-18 ENCOUNTER — Inpatient Hospital Stay: Payer: Self-pay | Admitting: Nutrition

## 2018-06-18 ENCOUNTER — Other Ambulatory Visit: Payer: Self-pay

## 2018-06-18 ENCOUNTER — Inpatient Hospital Stay: Payer: Self-pay

## 2018-06-18 ENCOUNTER — Encounter: Payer: Self-pay | Admitting: Hematology

## 2018-06-18 ENCOUNTER — Ambulatory Visit: Payer: Self-pay | Admitting: Hematology

## 2018-06-18 VITALS — BP 116/45 | HR 78 | Temp 97.8°F | Resp 18

## 2018-06-18 DIAGNOSIS — C8440 Peripheral T-cell lymphoma, not classified, unspecified site: Secondary | ICD-10-CM

## 2018-06-18 LAB — BPAM RBC
Blood Product Expiration Date: 201908272359
ISSUE DATE / TIME: 201907301315
UNIT TYPE AND RH: 5100

## 2018-06-18 LAB — TYPE AND SCREEN
ABO/RH(D): O POS
ANTIBODY SCREEN: NEGATIVE
UNIT DIVISION: 0

## 2018-06-18 MED ORDER — SODIUM CHLORIDE 0.9 % IV SOLN
750.0000 mg/m2 | Freq: Once | INTRAVENOUS | Status: AC
  Administered 2018-06-18: 1120 mg via INTRAVENOUS
  Filled 2018-06-18: qty 56

## 2018-06-18 MED ORDER — PEGFILGRASTIM 6 MG/0.6ML ~~LOC~~ PSKT
PREFILLED_SYRINGE | SUBCUTANEOUS | Status: AC
Start: 2018-06-18 — End: ?
  Filled 2018-06-18: qty 0.6

## 2018-06-18 MED ORDER — PALONOSETRON HCL INJECTION 0.25 MG/5ML
0.2500 mg | Freq: Once | INTRAVENOUS | Status: AC
  Administered 2018-06-18: 0.25 mg via INTRAVENOUS

## 2018-06-18 MED ORDER — SODIUM CHLORIDE 0.9 % IV SOLN
1.8000 mg/kg | INTRAVENOUS | Status: DC
  Administered 2018-06-18: 90 mg via INTRAVENOUS
  Filled 2018-06-18: qty 18

## 2018-06-18 MED ORDER — SODIUM CHLORIDE 0.9% FLUSH
10.0000 mL | INTRAVENOUS | Status: DC | PRN
  Administered 2018-06-18: 10 mL
  Filled 2018-06-18: qty 10

## 2018-06-18 MED ORDER — DOXORUBICIN HCL CHEMO IV INJECTION 2 MG/ML
50.0000 mg/m2 | Freq: Once | INTRAVENOUS | Status: AC
  Administered 2018-06-18: 74 mg via INTRAVENOUS
  Filled 2018-06-18: qty 37

## 2018-06-18 MED ORDER — PEGFILGRASTIM 6 MG/0.6ML ~~LOC~~ PSKT: 6.0000 mg | PREFILLED_SYRINGE | Freq: Once | SUBCUTANEOUS | Status: DC

## 2018-06-18 MED ORDER — DEXAMETHASONE SODIUM PHOSPHATE 10 MG/ML IJ SOLN
10.0000 mg | Freq: Once | INTRAMUSCULAR | Status: AC
  Administered 2018-06-18: 10 mg via INTRAVENOUS

## 2018-06-18 MED ORDER — HEPARIN SOD (PORK) LOCK FLUSH 100 UNIT/ML IV SOLN
500.0000 [IU] | Freq: Once | INTRAVENOUS | Status: AC | PRN
Start: 2018-06-18 — End: 2018-06-18
  Administered 2018-06-18: 500 [IU]
  Filled 2018-06-18: qty 5

## 2018-06-18 MED ORDER — DEXAMETHASONE SODIUM PHOSPHATE 10 MG/ML IJ SOLN
INTRAMUSCULAR | Status: AC
  Filled 2018-06-18: qty 1

## 2018-06-18 MED ORDER — SODIUM CHLORIDE 0.9 % IV SOLN
Freq: Once | INTRAVENOUS | Status: AC
  Administered 2018-06-18: 09:00:00 via INTRAVENOUS
  Filled 2018-06-18: qty 250

## 2018-06-18 MED ORDER — PALONOSETRON HCL INJECTION 0.25 MG/5ML
INTRAVENOUS | Status: AC
  Filled 2018-06-18: qty 5

## 2018-06-18 NOTE — Progress Notes (Signed)
Patient came in to bring proof of income for [place].  Patient approved for one-time $400 grant to assist with Eliquis need and other personal expenses such as gas card. Gave a copy of the approval letter as well as the expense sheet. Called [facility] to confirm rx is ready. She states it is.Patient has my card for any additional financial questions or concerns.  Forwarded copy of income to [place] for drug replacement for Neulasta per his request.

## 2018-06-18 NOTE — Progress Notes (Signed)
Per Dr. Irene Limbo, [place] to tx today.

## 2018-06-18 NOTE — Progress Notes (Signed)
62 year old female diagnosed with lymphoma.  She is a patient of Dr. Irene Limbo.  Past medical history includes pneumonia, hyperlipidemia, hypertension, endometriosis, bilateral PE.  Medications include multivitamin, MiraLAX, and prednisone.  Labs include glucose 136 and albumin 3.5.  Height: 5 feet 1 inch. Weight: 105.2 pounds July 30. Usual body weight: 128 pounds June 2019. BMI: 19.36.  Patient reports she has taste alterations.  Food tastes particularly bland.   She thinks her appetite has improved. She is trying to eat more calories and protein. Reports she drinks Carnation breakfast essentials and has tolerated Ensure. Nutrition focused physical exam deferred.  Nutrition diagnosis:  Unintended weight loss related to poor appetite and inadequate oral intake as evidenced by 18% weight loss over 2 months.  Intervention: Patient educated on strategies for improving taste alterations. Encouraged baking soda and salt water rinses. Recommended patient increase Carnation breakfast or Ensure Enlive twice daily between meals. Provided fact sheets on increasing calories and protein, high-protein meals, and taste alterations. Questions were answered.  Teach back method used.  Contact information given.  Monitoring, evaluation, goals: Patient will tolerate increased calories and protein to promote weight gain.  Next visit: Wednesday, August 21 during infusion.  **Disclaimer: This note was dictated with voice recognition software. Similar sounding words can inadvertently be transcribed and this note may contain transcription errors which may not have been corrected upon publication of note.**

## 2018-06-18 NOTE — Patient Instructions (Signed)
  [facility] Discharge Instructions for Patients Receiving Chemotherapy  Today you received the following chemotherapy agents Adcetris, Adriamycin, Cytoxan.  To help prevent nausea and vomiting after your treatment, we encourage you to take your nausea medication as directed.  If you develop nausea and vomiting that is not controlled by your nausea medication, call the clinic.   BELOW ARE SYMPTOMS THAT SHOULD BE REPORTED IMMEDIATELY:  *FEVER GREATER THAN 100.5 F  *CHILLS WITH OR WITHOUT FEVER  NAUSEA AND VOMITING THAT IS NOT CONTROLLED WITH YOUR NAUSEA MEDICATION  *UNUSUAL SHORTNESS OF BREATH  *UNUSUAL BRUISING OR BLEEDING  TENDERNESS IN MOUTH AND THROAT WITH OR WITHOUT PRESENCE OF ULCERS  *URINARY PROBLEMS  *BOWEL PROBLEMS  UNUSUAL RASH Items with * indicate a potential emergency and should be followed up as soon as possible.  Feel free to call the clinic should you have any questions or concerns. The clinic phone number is [phone].  Please show the [place] at check-in to the Emergency Department and triage nurse.

## 2018-06-19 ENCOUNTER — Inpatient Hospital Stay: Payer: Self-pay | Attending: Hematology

## 2018-06-19 VITALS — BP 135/48 | HR 86 | Temp 98.0°F | Resp 18

## 2018-06-19 DIAGNOSIS — F17210 Nicotine dependence, cigarettes, uncomplicated: Secondary | ICD-10-CM | POA: Insufficient documentation

## 2018-06-19 DIAGNOSIS — Z7901 Long term (current) use of anticoagulants: Secondary | ICD-10-CM | POA: Insufficient documentation

## 2018-06-19 DIAGNOSIS — C865 Angioimmunoblastic T-cell lymphoma: Secondary | ICD-10-CM | POA: Insufficient documentation

## 2018-06-19 DIAGNOSIS — E876 Hypokalemia: Secondary | ICD-10-CM | POA: Insufficient documentation

## 2018-06-19 DIAGNOSIS — Z5112 Encounter for antineoplastic immunotherapy: Secondary | ICD-10-CM | POA: Insufficient documentation

## 2018-06-19 DIAGNOSIS — I10 Essential (primary) hypertension: Secondary | ICD-10-CM | POA: Insufficient documentation

## 2018-06-19 DIAGNOSIS — J90 Pleural effusion, not elsewhere classified: Secondary | ICD-10-CM | POA: Insufficient documentation

## 2018-06-19 DIAGNOSIS — D696 Thrombocytopenia, unspecified: Secondary | ICD-10-CM | POA: Insufficient documentation

## 2018-06-19 DIAGNOSIS — C8440 Peripheral T-cell lymphoma, not classified, unspecified site: Secondary | ICD-10-CM

## 2018-06-19 DIAGNOSIS — Z79899 Other long term (current) drug therapy: Secondary | ICD-10-CM | POA: Insufficient documentation

## 2018-06-19 DIAGNOSIS — I82621 Acute embolism and thrombosis of deep veins of right upper extremity: Secondary | ICD-10-CM | POA: Insufficient documentation

## 2018-06-19 DIAGNOSIS — Z5189 Encounter for other specified aftercare: Secondary | ICD-10-CM | POA: Insufficient documentation

## 2018-06-19 DIAGNOSIS — D649 Anemia, unspecified: Secondary | ICD-10-CM | POA: Insufficient documentation

## 2018-06-19 MED ORDER — PEGFILGRASTIM INJECTION 6 MG/0.6ML ~~LOC~~
PREFILLED_SYRINGE | SUBCUTANEOUS | Status: AC
  Filled 2018-06-19: qty 0.6

## 2018-06-19 MED ORDER — PEGFILGRASTIM INJECTION 6 MG/0.6ML ~~LOC~~
6.0000 mg | PREFILLED_SYRINGE | Freq: Once | SUBCUTANEOUS | Status: AC
  Administered 2018-06-19: 6 mg via SUBCUTANEOUS

## 2018-06-19 MED ORDER — PEGFILGRASTIM-CBQV 6 MG/0.6ML ~~LOC~~ SOSY
PREFILLED_SYRINGE | SUBCUTANEOUS | Status: AC
  Filled 2018-06-19: qty 0.6

## 2018-06-26 NOTE — Progress Notes (Signed)
HEMATOLOGY/ONCOLOGY CLINIC NOTE  Date of Service: 06/27/2018  Patient Care Team: Patient, No Pcp Per as PCP - General ([place]) [name], MD (Unknown Physician Specialty)  CHIEF COMPLAINTS/PURPOSE OF CONSULTATION:  Recently  diagnosed angioimmunoblastic T cell lymphoma  HISTORY OF PRESENTING ILLNESS:   Deborah Cooley is a wonderful 62 y.o. female who has been referred to [place] by Dr Joni Reining for evaluation and management of her newly diagnosed angioimmunoblastic T cell lymphoma.   Patient has a h/o HTN, endometriosis, tobacco use who was admitted to the hospital with shortness of breath and was subsequently found to have diffuse lymphadenopathy. She was also found to have bilateral pleural effusions and has had a thoracentesis with pleural fluid demonstrating atypical lymphocytosis. She was discharged but returned to the ED with complaints of persisting SOB, abdominal swelling, and lower extremity swelling.   She has had a  CTA chest and CT abd/pelvis which showed no PE.but extensive LNadenopathy in the chest and throughout the abd with b/l pleural effusion and mild ascites. Also noted to have narrowing in b/l common iliac arteries.  Clinically she is noted to have b/l LE edema 1-2+  Most recent lab results (05/05/18) of CBC  is as follows: all values are WNL except for RBC at 2.59, HGB at 7.7, HCT at 22.7, RDW at 16.0, PLT at 89k.  She has had a CT bone marrow biopsy on 05/05/2018 -results pending. ECHO was done and showed  Systolic function was   normal. The estimated ejection fraction was in the range of 60%   to 65%. Wall motion was normal; there were no regional wall   motion abnormalities. Left ventricular diastolic function   parameters were normal.  Port a cath placement requested.  Patient was transferred to [facility] with her consent to receive C1 of chemotherapy as inpatient.  Interval History:   Deborah Cooley returns today for management and  evaluation of her Angioimmunoblastic T cell lymphoma post C3 for toxicity check. The patient's last visit with [place] was on 06/17/18. She is accompanied today by her husband. The pt reports that she is doing well overall.   The pt reports that she is not finding that any of her food tastes good. She had been consuming 1 Boost each day until the last 3 days. She has been continuing to take Eliquis but needs a refill.   Lab results today (06/27/18) of CBC w/diff, CMP, and Reticulocytes is as follows: all values are WNL except for WBC at 1.3k, RBC at 2.64, HGB at 7.3, HCT at 21.3, RDW at 15.4, PLT at 46k, ANC at 600, Lymphs abs at 500, Glucose at 110, Retic ct pct at 0.1%, Retic ct abs at 2.6k.  On review of systems, pt reports feeling tired, breathing well, and denies fevers, chills, night sweats, tingling/numbness in hands and feet, eating very well, problems with bleeding, leg swelling, and any other symptoms.   MEDICAL HISTORY:  Past Medical History:  Diagnosis Date  . Bilateral pleural effusion 04/17/2018  . Endometriosis   . Essential hypertension, benign   . Family history of adverse reaction to anesthesia    "sister stops breathing I think" (04/18/2018)  . Heart murmur   . Mixed hyperlipidemia   . Pneumonia 04/17/2018    SURGICAL HISTORY: Past Surgical History:  Procedure Laterality Date  . ABDOMINAL HYSTERECTOMY  1970s/1980s   for endometriosis  . Arthroscopic right shoulder surgery    . AXILLARY LYMPH NODE BIOPSY Left 04/21/2018  Procedure: LEFT AXILLARY LYMPH NODE BIOPSY;  Surgeon: Kinsinger, Arta Bruce, MD;  Location: [facility];  Service: General;  Laterality: Left;  . CARPAL TUNNEL RELEASE Right   . GANGLION CYST EXCISION Right   . IR IMAGING GUIDED PORT INSERTION  05/07/2018  . MULTIPLE TOOTH EXTRACTIONS    . OOPHORECTOMY  1997  . PARTIAL HYSTERECTOMY  1984  . Right hand surgery    . SHOULDER OPEN ROTATOR CUFF REPAIR Right   . VESICOVAGINAL FISTULA CLOSURE W/ TAH      SOCIAL  HISTORY: Social History   Socioeconomic History  . Marital status: Married    Spouse name: Not on file  . Number of children: Not on file  . Years of education: Not on file  . Highest education level: Not on file  Occupational History  . Occupation: Social research officer, government    Comment: works at [employer]  . Financial resource strain: Not on file  . Food insecurity:    Worry: Not on file    Inability: Not on file  . Transportation needs:    Medical: Not on file    Non-medical: Not on file  Tobacco Use  . Smoking status: Current Every Day Smoker    Packs/day: 1.50    Years: 46.00    Pack years: 69.00    Types: Cigarettes  . Smokeless tobacco: Never Used  Substance and Sexual Activity  . Alcohol use: Not Currently    Comment: 04/18/2018 "quit years ago"  . Drug use: Not Currently    Comment: "back in my 18s"  . Sexual activity: Not Currently  Lifestyle  . Physical activity:    Days per week: Not on file    Minutes per session: Not on file  . Stress: Not on file  Relationships  . Social connections:    Talks on phone: Not on file    Gets together: Not on file    Attends religious service: Not on file    Active member of club or organization: Not on file    Attends meetings of clubs or organizations: Not on file    Relationship status: Not on file  . Intimate partner violence:    Fear of current or ex partner: Not on file    Emotionally abused: Not on file    Physically abused: Not on file    Forced sexual activity: Not on file  Other Topics Concern  . Not on file  Social History Narrative   ** Merged History Encounter **       Patient has one son whom is healthy.   Has a live in boyfriend.    FAMILY HISTORY: Family History  Problem Relation Age of Onset  . Diabetes Mother        age 90  . Heart attack Father        died age 83's  . Hypertension Father   . Cancer Sister        breast cancer diagonsed age 76 years  . Diabetes Unknown   .  Heart disease Unknown        Female <55  . Arthritis Unknown   . Asthma Unknown   . Hyperlipidemia Other        several siblings  . Thyroid disease Sister        one sister with thyroid disease    ALLERGIES:  is allergic to [place] [zolpidem].  MEDICATIONS:  Current Outpatient Medications  Medication Sig Dispense Refill  .  acetaminophen (TYLENOL) 325 MG tablet Take 2 tablets (650 mg total) by mouth every 6 (six) hours as needed for mild pain (or Fever >/= 101).    Marland Kitchen apixaban (ELIQUIS) 2.5 MG TABS tablet Take 1 tablet (2.5 mg total) by mouth 2 (two) times daily. 60 tablet 4  . lidocaine-prilocaine (EMLA) cream Apply a dime size to port-a-cath 1-2 hours prior to access. Cover with saran wrap. 30 g 2  . LORazepam (ATIVAN) 0.5 MG tablet Take 1 tablet (0.5 mg total) by mouth every 8 (eight) hours as needed for anxiety. 30 tablet 0  . Multiple Vitamin (MULTIVITAMIN WITH MINERALS) TABS tablet Take 1 tablet by mouth daily. 30 tablet 0  . ondansetron (ZOFRAN) 8 MG tablet Take 1 tablet (8 mg total) by mouth every 8 (eight) hours as needed for nausea. Start on day 3 after chemotherapy 30 tablet 3  . polyethylene glycol (MIRALAX / GLYCOLAX) packet Take 17 g by mouth daily as needed for mild constipation. 30 each 0  . potassium chloride SA (K-DUR,KLOR-CON) 20 MEQ tablet 40 meq orally twice daily for 2 daily then 28mq PO daily 30 tablet 0  . predniSONE (DELTASONE) 20 MG tablet Take 3 tablets (60 mg total) by mouth daily. Take on days 1-5 of chemotherapy. (Patient taking differently: Take 10 mg by mouth daily. Take on days 1-5 of chemotherapy.) 15 tablet 6  . prochlorperazine (COMPAZINE) 10 MG tablet Take 1 tablet (10 mg total) by mouth every 6 (six) hours as needed for nausea or vomiting. 30 tablet 1   No current facility-administered medications for this visit.     REVIEW OF SYSTEMS:    A 10+ POINT REVIEW OF SYSTEMS WAS OBTAINED including neurology, dermatology, psychiatry, cardiac, respiratory,  lymph, extremities, GI, GU, Musculoskeletal, constitutional, breasts, reproductive, HEENT.  All pertinent positives are noted in the HPI.  All others are negative.   PHYSICAL EXAMINATION: ECOG PERFORMANCE STATUS: 2 - Symptomatic, <50% confined to bed  Vitals:   06/27/18 1114  BP: (!) 103/45  Pulse: 89  Resp: 18  Temp: 98.4 F (36.9 C)  SpO2: 99%   Filed Weights   06/27/18 1114  Weight: 102 lb 11.2 oz (46.6 kg)   .Body mass index is 18.9 kg/m.  GENERAL:alert, in no acute distress and comfortable SKIN: no acute rashes, no significant lesions EYES: conjunctival pallor +, sclera anicteric OROPHARYNX: MMM, no exudates, no oropharyngeal erythema or ulceration NECK: supple, no JVD LYMPH:  no palpable lymphadenopathy in the cervical, axillary or inguinal regions LUNGS: decreased breath sounds 1/5/ up right HEART: regular rate & rhythm ABDOMEN:  normoactive bowel sounds , non tender, not distended. No palpable hepatosplenomegaly.  Extremity: no pedal edema PSYCH: alert & oriented x 3 with fluent speech NEURO: no focal motor/sensory deficits   LABORATORY DATA:  I have reviewed the data as listed  . CBC Latest Ref Rng & Units 06/27/2018 06/17/2018 06/11/2018  WBC 3.9 - 10.3 K/uL 1.3(L) 3.3(L) 2.3(L)  Hemoglobin 11.6 - 15.9 g/dL 7.3(L) 7.9(L) 9.3(L)  Hematocrit 34.8 - 46.6 % 21.3(L) 23.1(L) 27.2(L)  Platelets 145 - 400 K/uL 46(L) 220 247   . CBC    Component Value Date/Time   WBC 1.3 (L) 06/27/2018 1041   RBC 2.64 (L) 06/27/2018 1041   RBC 2.64 (L) 06/27/2018 1041   HGB 7.3 (L) 06/27/2018 1041   HGB 9.3 (L) 06/11/2018 0859   HCT 21.3 (L) 06/27/2018 1041   PLT 46 (L) 06/27/2018 1041   PLT 247 06/11/2018 0859  MCV 80.7 06/27/2018 1041   MCH 27.7 06/27/2018 1041   MCHC 34.3 06/27/2018 1041   RDW 15.4 (H) 06/27/2018 1041   LYMPHSABS 0.5 (L) 06/27/2018 1041   MONOABS 0.2 06/27/2018 1041   EOSABS 0.0 06/27/2018 1041   BASOSABS 0.0 06/27/2018 1041    . CMP Latest Ref Rng  & Units 06/27/2018 06/17/2018 06/04/2018  Glucose 70 - 99 mg/dL 110(H) 136(H) 197(H)  BUN 8 - 23 mg/dL 8 7(L) 12  Creatinine 0.44 - 1.00 mg/dL 0.65 0.66 0.78  Sodium 135 - 145 mmol/L 135 141 128(L)  Potassium 3.5 - 5.1 mmol/L 3.6 4.4 5.1  Chloride 98 - 111 mmol/L 100 105 96(L)  CO2 22 - 32 mmol/L _0 Calcium 8.9 - 10.3 mg/dL 10.1 9.5 9.9  Total Protein 6.5 - 8.1 g/dL 7.1 7.1 7.3  Total Bilirubin 0.3 - 1.2 mg/dL 0.9 0.5 1.0  Alkaline Phos 38 - 126 U/L 93 77 104  AST 15 - 41 U/L _1 ALT 0 - 44 U/L 27 18 40    . Lab Results  Component Value Date   LDH 194 (H) 06/04/2018      Component     Latest Ref Rng & Units 04/18/2018 05/03/2018  Hepatitis B Surface Ag     Negative  Negative  HCV Ab     0.0 - 0.9 s/co ratio  <0.1  Hep A Ab, IgM     Negative  Negative  Hep B Core Ab, IgM     Negative  Negative  HIV Screen 4th Generation wRfx     Non Reactive Non Reactive     04/21/18 Molecular Pathology:   04/21/18 Surgical Pathology:    RADIOGRAPHIC STUDIES: I have personally reviewed the radiological images as listed and agreed with the findings in the report. No results found.  ASSESSMENT & PLAN:   62 y.o. female with  1. 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 S/P C1 OF CHOP on 05/08/2018 S/p C2 of Brentuximab-CHP on 05/28/2018  S/p C3 of Brentuximab-CHP on 06/18/2018 2. General LNadenopathy from lymphoma with b/l pleural effusion and b/l lower extremity weakness  3. 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  4. 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 LDH wnl no evidnece of overt hemolysis at this timne   5. CMV IgM+ --  Component     Latest Ref Rng & Units 05/09/2018  CMV Quant DNA PCR (Plasma)     Negative IU/mL 8,430  Log10 CMV Qn DNA Pl     log10 IU/mL 3.926    6. High risk for TLS - on allopurinol , received  Rasburicase. Uric acid normalized Uric acid 16--->8--> 4.6-->3.4--> 3.6  7. Port a cath in situ  8. 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 S/p Acute hypoxic respiratory failure - required intubation --now extubated and with minimal oxygen needs.  ?etiology Re-expansion pulmonary edema post large volume thoracentesis vs aspiration Though the rapidity of presentation is unusual would need to consider HCAP and CMV pneumonitis (CMV IgM+ and PCV positive with worsening). Resolved hypoxia - treated with antibiotics for aspiration pneumonia.  10 . RUE DVT - US 6/25 -- was briefly on IV Heparin but held given thrombocytopenia -- currently on Eliquis  11.. Hypokalemia K improved to 3.6 PLAN: -Discussed pt labwork today, 06/27/18; HGB  at 7.3, PLT at 46k, ANC at 600.  -Ordering two units of PRBCs for tommorrow -Recommended that the pt increase her Boost/Ensure consumption and do her best to keep eating -Continue Eliquis, providing refill today -Will see the pt back in 2 weeks, prior to beginning C3  -PET/CT at end of C3 scheduled for 07/01/2018 - RTC with labs/MD visit and C4 of treatment as scheduled on 07/09/2018  All of the patients questions were answered with apparent satisfaction. The patient knows to call the clinic with any problems, questions or concerns.  The total time spent in the appt was 30 minutes and more than 50% was on counseling and direct patient cares.     Sullivan Lone MD MS AAHIVMS [facility] Hematology/Oncology Physician [facility]  (Office):       [phone] (Work cell):  [phone] (Fax):           [phone]  I, Baldwin Jamaica, am acting as a scribe for Dr. Irene Limbo  .I have reviewed the above documentation for accuracy and completeness, and I agree with the above. Brunetta Genera MD

## 2018-06-27 ENCOUNTER — Encounter: Payer: Self-pay | Admitting: Hematology

## 2018-06-27 ENCOUNTER — Other Ambulatory Visit: Payer: Self-pay

## 2018-06-27 ENCOUNTER — Inpatient Hospital Stay: Payer: Self-pay

## 2018-06-27 ENCOUNTER — Telehealth: Payer: Self-pay

## 2018-06-27 ENCOUNTER — Inpatient Hospital Stay (HOSPITAL_BASED_OUTPATIENT_CLINIC_OR_DEPARTMENT_OTHER): Payer: Self-pay | Admitting: Hematology

## 2018-06-27 VITALS — BP 103/45 | HR 89 | Temp 98.4°F | Resp 18 | Ht 61.81 in | Wt 102.7 lb

## 2018-06-27 DIAGNOSIS — I10 Essential (primary) hypertension: Secondary | ICD-10-CM

## 2018-06-27 DIAGNOSIS — C8440 Peripheral T-cell lymphoma, not classified, unspecified site: Secondary | ICD-10-CM

## 2018-06-27 DIAGNOSIS — D696 Thrombocytopenia, unspecified: Secondary | ICD-10-CM

## 2018-06-27 DIAGNOSIS — E876 Hypokalemia: Secondary | ICD-10-CM

## 2018-06-27 DIAGNOSIS — F17210 Nicotine dependence, cigarettes, uncomplicated: Secondary | ICD-10-CM

## 2018-06-27 DIAGNOSIS — C865 Angioimmunoblastic T-cell lymphoma: Secondary | ICD-10-CM

## 2018-06-27 DIAGNOSIS — D649 Anemia, unspecified: Secondary | ICD-10-CM

## 2018-06-27 DIAGNOSIS — I82621 Acute embolism and thrombosis of deep veins of right upper extremity: Secondary | ICD-10-CM

## 2018-06-27 DIAGNOSIS — D630 Anemia in neoplastic disease: Secondary | ICD-10-CM

## 2018-06-27 DIAGNOSIS — J90 Pleural effusion, not elsewhere classified: Secondary | ICD-10-CM

## 2018-06-27 DIAGNOSIS — Z7901 Long term (current) use of anticoagulants: Secondary | ICD-10-CM

## 2018-06-27 LAB — CMP (CANCER CENTER ONLY)
ALK PHOS: 93 U/L (ref 38–126)
ALT: 27 U/L (ref 0–44)
AST: 22 U/L (ref 15–41)
Albumin: 3.6 g/dL (ref 3.5–5.0)
Anion gap: 9 (ref 5–15)
BUN: 8 mg/dL (ref 8–23)
CALCIUM: 10.1 mg/dL (ref 8.9–10.3)
CO2: 26 mmol/L (ref 22–32)
CREATININE: 0.65 mg/dL (ref 0.44–1.00)
Chloride: 100 mmol/L (ref 98–111)
Glucose, Bld: 110 mg/dL — ABNORMAL HIGH (ref 70–99)
Potassium: 3.6 mmol/L (ref 3.5–5.1)
Sodium: 135 mmol/L (ref 135–145)
Total Bilirubin: 0.9 mg/dL (ref 0.3–1.2)
Total Protein: 7.1 g/dL (ref 6.5–8.1)

## 2018-06-27 LAB — RETICULOCYTES
RBC.: 2.64 MIL/uL — AB (ref 3.70–5.45)
RETIC CT PCT: 0.1 % — AB (ref 0.7–2.1)
Retic Count, Absolute: 2.6 10*3/uL — ABNORMAL LOW (ref 33.7–90.7)

## 2018-06-27 LAB — CBC WITH DIFFERENTIAL/PLATELET
Basophils Absolute: 0 10*3/uL (ref 0.0–0.1)
Basophils Relative: 1 %
EOS ABS: 0 10*3/uL (ref 0.0–0.5)
Eosinophils Relative: 1 %
HEMATOCRIT: 21.3 % — AB (ref 34.8–46.6)
Hemoglobin: 7.3 g/dL — ABNORMAL LOW (ref 11.6–15.9)
Lymphocytes Relative: 35 %
Lymphs Abs: 0.5 10*3/uL — ABNORMAL LOW (ref 0.9–3.3)
MCH: 27.7 pg (ref 25.1–34.0)
MCHC: 34.3 g/dL (ref 31.5–36.0)
MCV: 80.7 fL (ref 79.5–101.0)
MONO ABS: 0.2 10*3/uL (ref 0.1–0.9)
MONOS PCT: 14 %
Neutro Abs: 0.6 10*3/uL — ABNORMAL LOW (ref 1.5–6.5)
Neutrophils Relative %: 49 %
Platelets: 46 10*3/uL — ABNORMAL LOW (ref 145–400)
RBC: 2.64 MIL/uL — ABNORMAL LOW (ref 3.70–5.45)
RDW: 15.4 % — AB (ref 11.2–14.5)
WBC: 1.3 10*3/uL — ABNORMAL LOW (ref 3.9–10.3)

## 2018-06-27 LAB — PREPARE RBC (CROSSMATCH)

## 2018-06-27 NOTE — Telephone Encounter (Signed)
Printed avs and calender of upcoming appointment. Per 8/9 los 

## 2018-06-27 NOTE — Progress Notes (Signed)
Received [employer] PA[employer] for Eliquis application from [facility].   Faxed to [employer]. Fax received ok per confirmation sheet.

## 2018-06-27 NOTE — Telephone Encounter (Signed)
Dr. Irene Limbo made aware of hemoglobin count 7.3 and platelet count 46.

## 2018-06-28 ENCOUNTER — Inpatient Hospital Stay: Payer: Self-pay

## 2018-06-28 DIAGNOSIS — C8440 Peripheral T-cell lymphoma, not classified, unspecified site: Secondary | ICD-10-CM

## 2018-06-28 DIAGNOSIS — D649 Anemia, unspecified: Secondary | ICD-10-CM

## 2018-06-28 LAB — PREPARE RBC (CROSSMATCH)

## 2018-06-28 MED ORDER — HEPARIN SOD (PORK) LOCK FLUSH 100 UNIT/ML IV SOLN
500.0000 [IU] | Freq: Every day | INTRAVENOUS | Status: AC | PRN
  Administered 2018-06-28: 500 [IU]
  Filled 2018-06-28: qty 5

## 2018-06-28 MED ORDER — ACETAMINOPHEN 325 MG PO TABS
650.0000 mg | ORAL_TABLET | Freq: Once | ORAL | Status: AC
  Administered 2018-06-28: 650 mg via ORAL

## 2018-06-28 MED ORDER — ACETAMINOPHEN 325 MG PO TABS
ORAL_TABLET | ORAL | Status: AC
  Filled 2018-06-28: qty 2

## 2018-06-28 MED ORDER — DIPHENHYDRAMINE HCL 25 MG PO CAPS
ORAL_CAPSULE | ORAL | Status: AC
  Filled 2018-06-28: qty 1

## 2018-06-28 MED ORDER — SODIUM CHLORIDE 0.9% FLUSH
10.0000 mL | INTRAVENOUS | Status: AC | PRN
  Administered 2018-06-28: 10 mL
  Filled 2018-06-28: qty 10

## 2018-06-28 MED ORDER — SODIUM CHLORIDE 0.9 % IV SOLN
250.0000 mL | Freq: Once | INTRAVENOUS | Status: AC
  Administered 2018-06-28: 250 mL via INTRAVENOUS
  Filled 2018-06-28: qty 250

## 2018-06-28 MED ORDER — DIPHENHYDRAMINE HCL 25 MG PO CAPS
25.0000 mg | ORAL_CAPSULE | Freq: Once | ORAL | Status: AC
  Administered 2018-06-28: 25 mg via ORAL

## 2018-06-28 NOTE — Patient Instructions (Signed)

## 2018-06-29 LAB — TYPE AND SCREEN
ABO/RH(D): O POS
Antibody Screen: NEGATIVE
UNIT DIVISION: 0
UNIT DIVISION: 0

## 2018-06-29 LAB — BPAM RBC
Blood Product Expiration Date: 201908232359
Blood Product Expiration Date: 201909012359
ISSUE DATE / TIME: 201908100802
ISSUE DATE / TIME: 201908100802
UNIT TYPE AND RH: 9500
Unit Type and Rh: 5100

## 2018-06-30 ENCOUNTER — Encounter: Payer: Self-pay | Admitting: Hematology

## 2018-06-30 DIAGNOSIS — Z7189 Other specified counseling: Secondary | ICD-10-CM | POA: Insufficient documentation

## 2018-07-01 ENCOUNTER — Encounter (HOSPITAL_COMMUNITY)
Admission: RE | Admit: 2018-07-01 | Discharge: 2018-07-01 | Disposition: A | Discharge status: Home / Self Care | Payer: Self-pay | Source: Ambulatory Visit | Attending: Hematology | Admitting: Hematology

## 2018-07-01 DIAGNOSIS — C8440 Peripheral T-cell lymphoma, not classified, unspecified site: Secondary | ICD-10-CM | POA: Insufficient documentation

## 2018-07-01 LAB — GLUCOSE, CAPILLARY: GLUCOSE-CAPILLARY: 111 mg/dL — AB (ref 70–99)

## 2018-07-01 MED ORDER — FLUDEOXYGLUCOSE F - 18 (FDG) INJECTION
5.1300 | Freq: Once | INTRAVENOUS | Status: AC | PRN
  Administered 2018-07-01: 5.13 via INTRAVENOUS

## 2018-07-02 ENCOUNTER — Encounter: Payer: Self-pay | Admitting: Pharmacy Technician

## 2018-07-02 ENCOUNTER — Encounter: Payer: Self-pay | Admitting: Hematology

## 2018-07-02 NOTE — Progress Notes (Signed)
The patient is approved for drug assistance by [facility] for Adcetris. Enrollment is based on self pay and is active until 10/02/18. Drug will be requested for the DOS 07/09/18.

## 2018-07-02 NOTE — Progress Notes (Signed)
The patient is approved for drug assistance by [employer] for Neulasta.  Enrollment is based on self pay and is active until 06/19/19.  Drug will be requested beginning DOS 05/29/18.

## 2018-07-02 NOTE — Progress Notes (Signed)
Called [employer]([facility]aron) after receiving a fax regarding Eliquis which myself nor Lenise submitted the application. Verified correct spelling of patient's name and demographic information as well as quantity and days supply corrected. Asked that medication be sent to patient directly and if any additional information was needed from [place]. She confirmed medication can be sent to patient and no additional information needed from [place].

## 2018-07-03 ENCOUNTER — Ambulatory Visit: Payer: Self-pay

## 2018-07-08 NOTE — Progress Notes (Signed)
HEMATOLOGY/ONCOLOGY CLINIC NOTE  Date of Service: 07/09/2018  Patient Care Team: Patient, No Pcp Per as PCP - General ([place]) [name], MD (Unknown Physician Specialty)  CHIEF COMPLAINTS/PURPOSE OF CONSULTATION:  Recently  diagnosed angioimmunoblastic T cell lymphoma  HISTORY OF PRESENTING ILLNESS:   Deborah Cooley is a wonderful 62 y.o. female who has been referred to [place] by Dr Joni Reining for evaluation and management of her newly diagnosed angioimmunoblastic T cell lymphoma.   Patient has a h/o HTN, endometriosis, tobacco use who was admitted to the hospital with shortness of breath and was subsequently found to have diffuse lymphadenopathy. She was also found to have bilateral pleural effusions and has had a thoracentesis with pleural fluid demonstrating atypical lymphocytosis. She was discharged but returned to the ED with complaints of persisting SOB, abdominal swelling, and lower extremity swelling.   She has had a  CTA chest and CT abd/pelvis which showed no PE.but extensive LNadenopathy in the chest and throughout the abd with b/l pleural effusion and mild ascites. Also noted to have narrowing in b/l common iliac arteries.  Clinically she is noted to have b/l LE edema 1-2+  Most recent lab results (05/05/18) of CBC  is as follows: all values are WNL except for RBC at 2.59, HGB at 7.7, HCT at 22.7, RDW at 16.0, PLT at 89k.  She has had a CT bone marrow biopsy on 05/05/2018 -results pending. ECHO was done and showed  Systolic function was   normal. The estimated ejection fraction was in the range of 60%   to 65%. Wall motion was normal; there were no regional wall   motion abnormalities. Left ventricular diastolic function   parameters were normal.  Port a cath placement requested.  Patient was transferred to [facility] with her consent to receive C1 of chemotherapy as inpatient.  Interval History:   Deborah Cooley returns today for management  and evaluation of her Angioimmunoblastic T cell lymphoma prior to C4 of treatment.  The patient's last visit with [place] was on 06/27/18. She is accompanied today by her husband. The pt reports that she is doing well overall.   The pt reports that she has continued in her smoking cessation. She notes that she has been eating better, and has gained five pounds in the last couple weeks. She notes that she has quit consuming her supplements. The pt notes that she has had no new concerns and has no concerns of infections.   Of note since the patient's last visit, pt has had PET/CT completed on 07/01/18 with results revealing Resolution of axillary, mediastinal, periaortic, and inguinal lymphadenopathy. ( Deauville 1) 2. Decrease in size of spleen.  Normal metabolic activity. 3. Uniform increase in marrow metabolic activity is most consistent with GCSF type response.  Lab results today (07/09/18) of CBC w/diff, CMP, and Reticulocytes is as follows: all values are WNL except for WBC at 2.7k, RBC at 3.00, HGB at 8.6, HCT at 24.8, RDW at 15.9, ANC at 1.4k, Lymphs abs at 700, Glucose at 156.  On review of systems, pt reports stable breathing, eating well, weight gain, moving her bowels well, and denies peripheral neuropathy, concerns for infection, and any other symptoms.    MEDICAL HISTORY:  Past Medical History:  Diagnosis Date  . Bilateral pleural effusion 04/17/2018  . Endometriosis   . Essential hypertension, benign   . Family history of adverse reaction to anesthesia    "sister stops breathing I think" (04/18/2018)  .  Heart murmur   . Mixed hyperlipidemia   . Pneumonia 04/17/2018    SURGICAL HISTORY: Past Surgical History:  Procedure Laterality Date  . ABDOMINAL HYSTERECTOMY  1970s/1980s   for endometriosis  . Arthroscopic right shoulder surgery    . AXILLARY LYMPH NODE BIOPSY Left 04/21/2018   Procedure: LEFT AXILLARY LYMPH NODE BIOPSY;  Surgeon: Kieth Brightly, Arta Bruce, MD;  Location: [place];   Service: General;  Laterality: Left;  . CARPAL TUNNEL RELEASE Right   . GANGLION CYST EXCISION Right   . IR IMAGING GUIDED PORT INSERTION  05/07/2018  . MULTIPLE TOOTH EXTRACTIONS    . OOPHORECTOMY  1997  . PARTIAL HYSTERECTOMY  1984  . Right hand surgery    . SHOULDER OPEN ROTATOR CUFF REPAIR Right   . VESICOVAGINAL FISTULA CLOSURE W/ TAH      SOCIAL HISTORY: Social History   Socioeconomic History  . Marital status: Married    Spouse name: Not on file  . Number of children: Not on file  . Years of education: Not on file  . Highest education level: Not on file  Occupational History  . Occupation: Social research officer, government    Comment: works at [employer]  . Financial resource strain: Not on file  . Food insecurity:    Worry: Not on file    Inability: Not on file  . Transportation needs:    Medical: Not on file    Non-medical: Not on file  Tobacco Use  . Smoking status: Current Every Day Smoker    Packs/day: 1.50    Years: 46.00    Pack years: 69.00    Types: Cigarettes  . Smokeless tobacco: Never Used  Substance and Sexual Activity  . Alcohol use: Not Currently    Comment: 04/18/2018 "quit years ago"  . Drug use: Not Currently    Comment: "back in my 57s"  . Sexual activity: Not Currently  Lifestyle  . Physical activity:    Days per week: Not on file    Minutes per session: Not on file  . Stress: Not on file  Relationships  . Social connections:    Talks on phone: Not on file    Gets together: Not on file    Attends religious service: Not on file    Active member of club or organization: Not on file    Attends meetings of clubs or organizations: Not on file    Relationship status: Not on file  . Intimate partner violence:    Fear of current or ex partner: Not on file    Emotionally abused: Not on file    Physically abused: Not on file    Forced sexual activity: Not on file  Other Topics Concern  . Not on file  Social History Narrative   **  Merged History Encounter **       Patient has one son whom is healthy.   Has a live in boyfriend.    FAMILY HISTORY: Family History  Problem Relation Age of Onset  . Diabetes Mother        age 73  . Heart attack Father        died age 58's  . Hypertension Father   . Cancer Sister        breast cancer diagonsed age 79 years  . Diabetes Unknown   . Heart disease Unknown        Female <55  . Arthritis Unknown   . Asthma Unknown   .  Hyperlipidemia Other        several siblings  . Thyroid disease Sister        one sister with thyroid disease    ALLERGIES:  is allergic to [place] [zolpidem].  MEDICATIONS:  Current Outpatient Medications  Medication Sig Dispense Refill  . acetaminophen (TYLENOL) 325 MG tablet Take 2 tablets (650 mg total) by mouth every 6 (six) hours as needed for mild pain (or Fever >/= 101).    Marland Kitchen apixaban (ELIQUIS) 2.5 MG TABS tablet Take 1 tablet (2.5 mg total) by mouth 2 (two) times daily. 60 tablet 4  . lidocaine-prilocaine (EMLA) cream Apply a dime size to port-a-cath 1-2 hours prior to access. Cover with saran wrap. 30 g 2  . LORazepam (ATIVAN) 0.5 MG tablet Take 1 tablet (0.5 mg total) by mouth every 8 (eight) hours as needed for anxiety. 30 tablet 0  . Multiple Vitamin (MULTIVITAMIN WITH MINERALS) TABS tablet Take 1 tablet by mouth daily. 30 tablet 0  . ondansetron (ZOFRAN) 8 MG tablet Take 1 tablet (8 mg total) by mouth every 8 (eight) hours as needed for nausea. Start on day 3 after chemotherapy 30 tablet 3  . polyethylene glycol (MIRALAX / GLYCOLAX) packet Take 17 g by mouth daily as needed for mild constipation. 30 each 0  . potassium chloride SA (K-DUR,KLOR-CON) 20 MEQ tablet 40 meq orally twice daily for 2 daily then 47mq PO daily 30 tablet 0  . predniSONE (DELTASONE) 20 MG tablet Take 3 tablets (60 mg total) by mouth daily. Take on days 1-5 of chemotherapy. (Patient taking differently: Take 10 mg by mouth daily. Take on days 1-5 of chemotherapy.) 15  tablet 6  . prochlorperazine (COMPAZINE) 10 MG tablet Take 1 tablet (10 mg total) by mouth every 6 (six) hours as needed for nausea or vomiting. 30 tablet 1   No current facility-administered medications for this visit.     REVIEW OF SYSTEMS:    A 10+ POINT REVIEW OF SYSTEMS WAS OBTAINED including neurology, dermatology, psychiatry, cardiac, respiratory, lymph, extremities, GI, GU, Musculoskeletal, constitutional, breasts, reproductive, HEENT.  All pertinent positives are noted in the HPI.  All others are negative.   PHYSICAL EXAMINATION: ECOG PERFORMANCE STATUS: 2 - Symptomatic, <50% confined to bed  Vitals:   07/09/18 1011  BP: (!) 128/53  Pulse: 77  Resp: 18  Temp: 98.4 F (36.9 C)  SpO2: 100%   Filed Weights   07/09/18 1011  Weight: 107 lb 3.2 oz (48.6 kg)   .Body mass index is 19.73 kg/m.  GENERAL:alert, in no acute distress and comfortable SKIN: no acute rashes, no significant lesions EYES: conjunctival pallor (+), sclera anicteric OROPHARYNX: MMM, no exudates, no oropharyngeal erythema or ulceration NECK: supple, no JVD LYMPH:  no palpable lymphadenopathy in the cervical, axillary or inguinal regions LUNGS: decreased breath sounds 1/5 up right HEART: regular rate & rhythm ABDOMEN:  normoactive bowel sounds , non tender, not distended. No palpable hepatosplenomegaly.  Extremity: no pedal edema PSYCH: alert & oriented x 3 with fluent speech NEURO: no focal motor/sensory deficits   LABORATORY DATA:  I have reviewed the data as listed  . CBC Latest Ref Rng & Units 07/09/2018 06/27/2018 06/17/2018  WBC 3.9 - 10.3 K/uL 2.7(L) 1.3(L) 3.3(L)  Hemoglobin 11.6 - 15.9 g/dL 8.6(L) 7.3(L) 7.9(L)  Hematocrit 34.8 - 46.6 % 24.8(L) 21.3(L) 23.1(L)  Platelets 145 - 400 K/uL 170 46(L) 220  ANC 1400 . CBC    Component Value Date/Time   WBC 2.7 (L)  07/09/2018 0855   RBC 3.00 (L) 07/09/2018 0855   HGB 8.6 (L) 07/09/2018 0855   HGB 9.3 (L) 06/11/2018 0859   HCT 24.8 (L)  07/09/2018 0855   PLT 170 07/09/2018 0855   PLT 247 06/11/2018 0859   MCV 82.7 07/09/2018 0855   MCH 28.7 07/09/2018 0855   MCHC 34.7 07/09/2018 0855   RDW 15.9 (H) 07/09/2018 0855   LYMPHSABS 0.7 (L) 07/09/2018 0855   MONOABS 0.7 07/09/2018 0855   EOSABS 0.0 07/09/2018 0855   BASOSABS 0.0 07/09/2018 0855    . CMP Latest Ref Rng & Units 07/09/2018 06/27/2018 06/17/2018  Glucose 70 - 99 mg/dL 156(H) 110(H) 136(H)  BUN 8 - 23 mg/dL 8 8 7(L)  Creatinine 0.44 - 1.00 mg/dL 0.60 0.65 0.66  Sodium 135 - 145 mmol/L 141 135 141  Potassium 3.5 - 5.1 mmol/L 3.9 3.6 4.4  Chloride 98 - 111 mmol/L 105 100 105  CO2 22 - 32 mmol/L '29 26 27  ' Calcium 8.9 - 10.3 mg/dL 9.2 10.1 9.5  Total Protein 6.5 - 8.1 g/dL 7.0 7.1 7.1  Total Bilirubin 0.3 - 1.2 mg/dL 0.5 0.9 0.5  Alkaline Phos 38 - 126 U/L 76 93 77  AST 15 - 41 U/L '25 22 18  ' ALT 0 - 44 U/L '31 27 18    ' . Lab Results  Component Value Date   LDH 204 (H) 07/09/2018      Component     Latest Ref Rng & Units 04/18/2018 05/03/2018  Hepatitis B Surface Ag     Negative  Negative  HCV Ab     0.0 - 0.9 s/co ratio  <0.1  Hep A Ab, IgM     Negative  Negative  Hep B Core Ab, IgM     Negative  Negative  HIV Screen 4th Generation wRfx     Non Reactive Non Reactive     04/21/18 Molecular Pathology:   04/21/18 Surgical Pathology:    RADIOGRAPHIC STUDIES: I have personally reviewed the radiological images as listed and agreed with the findings in the report. Nm Pet Image Restag (ps) Skull Base To Thigh  Result Date: 07/01/2018 CLINICAL DATA:  Subsequent treatment strategy for T-cell lymphoma. Three cycles of chemotherapy completed. Initial presentation with lymphadenopathy and pleural effusions. EXAM: NUCLEAR MEDICINE PET SKULL BASE TO THIGH TECHNIQUE: 111 mCi F-18 FDG was injected intravenously. Full-ring PET imaging was performed from the skull base to thigh after the radiotracer. CT data was obtained and used for attenuation correction and  anatomic localization. Fasting blood glucose: 5.1 mg/dl COMPARISON:  CT 04/18/2018 FINDINGS: Mediastinal blood pool activity: SUV max 2.0 NECK: No hypermetabolic lymph nodes in the neck. Incidental CT findings: none CHEST: Near complete resolution of bulky RIGHT axillary adenopathy. No hypermetabolic lymph nodes remain. Mild activity in the LEFT axilla likely represents excisional biopsy site. Activity is low with SUV max 1.5. Resolution of mediastinal adenopathy. No hypermetabolic mediastinal lymph nodes ( Deauville 1 Incidental CT findings: Port in the anterior chest wall with tip in distal SVC. Resolution of pleural effusions. ABDOMEN/PELVIS: Decrease in size of periaortic lymph nodes. For example lymph node LEFT aorta the level the kidneys measuring 11 mm short axis decreased from 21 mm. No associated metabolic activity. Resolution of the iliac adenopathy. No residual metabolic activity. Likewise resolution of inguinal adenopathy. No residual metabolic activity. The spleen is decreased in size measuring 10.5 cm in craniocaudad dimension compared to 12.3 cm. Incidental CT findings: Post hysterectomy. Atherosclerotic calcification of the  aorta. SKELETON: uniform increase in bone marrow activity particularly in the spine. Incidental CT findings: none IMPRESSION: 1. Resolution of axillary, mediastinal, periaortic, and inguinal lymphadenopathy. ( Deauville 1) 2. Decrease in size of spleen.  Normal metabolic activity. 3. Uniform increase in marrow metabolic activity is most consistent with GCSF type response. Electronically Signed   By: Suzy Bouchard M.D.   On: 07/01/2018 14:39    ASSESSMENT & PLAN:   62 y.o. female with  1. 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 S/P C1 OF CHOP on 05/08/2018 S/p C2 of Brentuximab-CHP on 05/28/2018  S/p C3 of Brentuximab-CHP on 06/18/2018  2. General LNadenopathy from lymphoma with b/l pleural  effusion and b/l lower extremity weakness- resolved  3. 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  4. 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 LDH wnl no evidnece of overt hemolysis at this timne   5. CMV IgM+ --  Component     Latest Ref Rng & Units 05/09/2018  CMV Quant DNA PCR (Plasma)     Negative IU/mL 8,430  Log10 CMV Qn DNA Pl     log10 IU/mL 3.926    6. High risk for TLS - on allopurinol , received Rasburicase. Uric acid normalized Uric acid 16--->8--> 4.6-->3.4--> 3.6  7. Port a cath in situ  8. 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 S/p Acute hypoxic respiratory failure - required intubation --now extubated and with minimal oxygen needs.  ?etiology Re-expansion pulmonary edema post large volume thoracentesis vs aspiration Though the rapidity of presentation is unusual would need to consider HCAP and CMV pneumonitis (CMV IgM+ and PCV positive with worsening). Resolved hypoxia - treated with antibiotics for aspiration pneumonia.  10 . RUE DVT - US 6/25 -- was briefly on IV Heparin but held given thrombocytopenia -- currently on Eliquis  11.. Hypokalemia K improved to 3.9  PLAN: -Recommended that the pt increase her Boost/Ensure consumption and do her best to keep eating -Continue Eliquis  -Discussed pt labwork today, 07/09/18; HGB at 8.6, PLT normalized at 170k, ANC at 1.4k, LDH at 204, blood chemistries are stable  -Discussed the 07/01/18 PET/CT which revealed Resolution of axillary, mediastinal, periaortic, and inguinal lymphadenopathy. ( Deauville 1) 2. Decrease in size of spleen.  Normal metabolic activity. 3. Uniform increase in marrow metabolic activity is most consistent with GCSF type response. -Discussed that the pt has reached complete metabolic response after 3 cycles of treatment  -Will reduce  dose of Cyclophosphamide to 570m/m2 for better blood count tolerance as neutropenia and anemia have remained significant -The pt has no prohibitive toxicities from continuing dose-reduced CHOP at this time.    -Will refill Dexamethasone today -Will see the pt back in 8-10 days with labs for transfusion evaluation and toxicity check    RTC with Dr KIrene Limboin 10 days with labs- ok to overbook if necessary PRBC transfusion appointment for 1 unit on followup in 10 days Please schedule C5 and C6 of chemotherapy with labs and MD visit   All of the patients questions were answered with apparent satisfaction. The patient knows to call the clinic with any problems, questions or concerns.  The total time spent in the appt was 25 minutes and more than 50% was on counseling and direct patient cares.     GSullivan LoneMD  MS AAHIVMS [facility] Hematology/Oncology Physician [facility]  (Office):       [phone] (Work cell):  [phone] (Fax):           [phone]  I, Baldwin [place], am acting as a scribe for Dr. Irene Limbo  .I have reviewed the above documentation for accuracy and completeness, and I agree with the above. Brunetta Genera MD

## 2018-07-09 ENCOUNTER — Inpatient Hospital Stay: Payer: Self-pay | Admitting: Nutrition

## 2018-07-09 ENCOUNTER — Inpatient Hospital Stay: Payer: Self-pay

## 2018-07-09 ENCOUNTER — Encounter: Payer: Self-pay | Admitting: Hematology

## 2018-07-09 ENCOUNTER — Inpatient Hospital Stay (HOSPITAL_BASED_OUTPATIENT_CLINIC_OR_DEPARTMENT_OTHER): Payer: Self-pay | Admitting: Hematology

## 2018-07-09 ENCOUNTER — Telehealth: Payer: Self-pay | Admitting: Hematology

## 2018-07-09 ENCOUNTER — Telehealth: Payer: Self-pay

## 2018-07-09 VITALS — BP 128/53 | HR 77 | Temp 98.4°F | Resp 18 | Ht 61.81 in | Wt 107.2 lb

## 2018-07-09 DIAGNOSIS — D630 Anemia in neoplastic disease: Secondary | ICD-10-CM

## 2018-07-09 DIAGNOSIS — D702 Other drug-induced agranulocytosis: Secondary | ICD-10-CM

## 2018-07-09 DIAGNOSIS — F17210 Nicotine dependence, cigarettes, uncomplicated: Secondary | ICD-10-CM

## 2018-07-09 DIAGNOSIS — C8440 Peripheral T-cell lymphoma, not classified, unspecified site: Secondary | ICD-10-CM

## 2018-07-09 DIAGNOSIS — E440 Moderate protein-calorie malnutrition: Secondary | ICD-10-CM

## 2018-07-09 DIAGNOSIS — C865 Angioimmunoblastic T-cell lymphoma: Secondary | ICD-10-CM

## 2018-07-09 DIAGNOSIS — Z7189 Other specified counseling: Secondary | ICD-10-CM

## 2018-07-09 DIAGNOSIS — E876 Hypokalemia: Secondary | ICD-10-CM

## 2018-07-09 DIAGNOSIS — I10 Essential (primary) hypertension: Secondary | ICD-10-CM

## 2018-07-09 DIAGNOSIS — D649 Anemia, unspecified: Secondary | ICD-10-CM

## 2018-07-09 DIAGNOSIS — E782 Mixed hyperlipidemia: Secondary | ICD-10-CM

## 2018-07-09 LAB — CMP (CANCER CENTER ONLY)
ALBUMIN: 3.7 g/dL (ref 3.5–5.0)
ALT: 31 U/L (ref 0–44)
AST: 25 U/L (ref 15–41)
Alkaline Phosphatase: 76 U/L (ref 38–126)
Anion gap: 7 (ref 5–15)
BUN: 8 mg/dL (ref 8–23)
CALCIUM: 9.2 mg/dL (ref 8.9–10.3)
CHLORIDE: 105 mmol/L (ref 98–111)
CO2: 29 mmol/L (ref 22–32)
Creatinine: 0.6 mg/dL (ref 0.44–1.00)
GFR, Est AFR Am: >60 mL/min (ref >60)
GFR, Estimated: >60 mL/min (ref >60)
Glucose, Bld: 156 mg/dL — ABNORMAL HIGH (ref 70–99)
POTASSIUM: 3.9 mmol/L (ref 3.5–5.1)
Sodium: 141 mmol/L (ref 135–145)
TOTAL PROTEIN: 7 g/dL (ref 6.5–8.1)
Total Bilirubin: 0.5 mg/dL (ref 0.3–1.2)

## 2018-07-09 LAB — CBC WITH DIFFERENTIAL/PLATELET
Basophils Absolute: 0 10*3/uL (ref 0.0–0.1)
Basophils Relative: 1 %
Eosinophils Absolute: 0 10*3/uL (ref 0.0–0.5)
Eosinophils Relative: 0 %
HEMATOCRIT: 24.8 % — AB (ref 34.8–46.6)
HEMOGLOBIN: 8.6 g/dL — AB (ref 11.6–15.9)
LYMPHS PCT: 24 %
Lymphs Abs: 0.7 10*3/uL — ABNORMAL LOW (ref 0.9–3.3)
MCH: 28.7 pg (ref 25.1–34.0)
MCHC: 34.7 g/dL (ref 31.5–36.0)
MCV: 82.7 fL (ref 79.5–101.0)
Monocytes Absolute: 0.7 10*3/uL (ref 0.1–0.9)
Monocytes Relative: 25 %
NEUTROS PCT: 50 %
Neutro Abs: 1.4 10*3/uL — ABNORMAL LOW (ref 1.5–6.5)
Platelets: 170 10*3/uL (ref 145–400)
RBC: 3 MIL/uL — AB (ref 3.70–5.45)
RDW: 15.9 % — ABNORMAL HIGH (ref 11.2–14.5)
WBC: 2.7 10*3/uL — ABNORMAL LOW (ref 3.9–10.3)

## 2018-07-09 LAB — LACTATE DEHYDROGENASE: LDH: 204 U/L — ABNORMAL HIGH (ref 98–192)

## 2018-07-09 LAB — SAMPLE TO BLOOD BANK

## 2018-07-09 MED ORDER — DOXORUBICIN HCL CHEMO IV INJECTION 2 MG/ML
50.0000 mg/m2 | Freq: Once | INTRAVENOUS | Status: AC
  Administered 2018-07-09: 72 mg via INTRAVENOUS
  Filled 2018-07-09: qty 36

## 2018-07-09 MED ORDER — SODIUM CHLORIDE 0.9 % IV SOLN
500.0000 mg/m2 | Freq: Once | INTRAVENOUS | Status: AC
  Administered 2018-07-09: 720 mg via INTRAVENOUS
  Filled 2018-07-09: qty 36

## 2018-07-09 MED ORDER — PALONOSETRON HCL INJECTION 0.25 MG/5ML
INTRAVENOUS | Status: AC
  Filled 2018-07-09: qty 5

## 2018-07-09 MED ORDER — SODIUM CHLORIDE 0.9 % IV SOLN
Freq: Once | INTRAVENOUS | Status: AC
  Administered 2018-07-09: 13:00:00 via INTRAVENOUS
  Filled 2018-07-09: qty 250

## 2018-07-09 MED ORDER — SODIUM CHLORIDE 0.9% FLUSH
10.0000 mL | INTRAVENOUS | Status: DC | PRN
  Administered 2018-07-09: 10 mL
  Filled 2018-07-09: qty 10

## 2018-07-09 MED ORDER — SODIUM CHLORIDE 0.9 % IV SOLN
1.9000 mg/kg | INTRAVENOUS | Status: DC
  Administered 2018-07-09: 90 mg via INTRAVENOUS
  Filled 2018-07-09: qty 18

## 2018-07-09 MED ORDER — HEPARIN SOD (PORK) LOCK FLUSH 100 UNIT/ML IV SOLN
500.0000 [IU] | Freq: Once | INTRAVENOUS | Status: AC | PRN
  Administered 2018-07-09: 500 [IU]
  Filled 2018-07-09: qty 5

## 2018-07-09 MED ORDER — PALONOSETRON HCL INJECTION 0.25 MG/5ML
0.2500 mg | Freq: Once | INTRAVENOUS | Status: AC
  Administered 2018-07-09: 0.25 mg via INTRAVENOUS

## 2018-07-09 MED ORDER — DEXAMETHASONE SODIUM PHOSPHATE 10 MG/ML IJ SOLN
INTRAMUSCULAR | Status: AC
  Filled 2018-07-09: qty 1

## 2018-07-09 MED ORDER — DEXAMETHASONE SODIUM PHOSPHATE 10 MG/ML IJ SOLN
10.0000 mg | Freq: Once | INTRAMUSCULAR | Status: AC
  Administered 2018-07-09: 10 mg via INTRAVENOUS

## 2018-07-09 NOTE — Progress Notes (Signed)
Nutrition follow-up completed with patient receiving treatment for lymphoma. Weight improved and documented as 107.2 pounds August 21, improved from 102.7 pounds August 9. Patient reports taste alterations have improved and she is eating more food. She continues to drink [employer] essentials. Patient denies nutrition impact symptoms.  Nutrition diagnosis: Unintended weight loss improved.  Intervention: Patient educated to continue strategies for increased calories and protein for weight maintenance/weight gain. Recommended she continue strategies for improved taste. Teach back method used.  Monitoring, evaluation, goals: Patient will tolerate increased calories and protein to promote weight gain.  Next visit: Wednesday, October 2 during infusion.  **Disclaimer: This note was dictated with voice recognition software. Similar sounding words can inadvertently be transcribed and this note may contain transcription errors which may not have been corrected upon publication of note.**

## 2018-07-09 NOTE — Patient Instructions (Signed)
  [facility] Discharge Instructions for Patients Receiving Chemotherapy  Today you received the following chemotherapy agents Adcetris, Adriamycin, Cytoxan.  To help prevent nausea and vomiting after your treatment, we encourage you to take your nausea medication as directed.  If you develop nausea and vomiting that is not controlled by your nausea medication, call the clinic.   BELOW ARE SYMPTOMS THAT SHOULD BE REPORTED IMMEDIATELY:  *FEVER GREATER THAN 100.5 F  *CHILLS WITH OR WITHOUT FEVER  NAUSEA AND VOMITING THAT IS NOT CONTROLLED WITH YOUR NAUSEA MEDICATION  *UNUSUAL SHORTNESS OF BREATH  *UNUSUAL BRUISING OR BLEEDING  TENDERNESS IN MOUTH AND THROAT WITH OR WITHOUT PRESENCE OF ULCERS  *URINARY PROBLEMS  *BOWEL PROBLEMS  UNUSUAL RASH Items with * indicate a potential emergency and should be followed up as soon as possible.  Feel free to call the clinic should you have any questions or concerns. The clinic phone number is [phone].  Please show the [place] at check-in to the Emergency Department and triage nurse.

## 2018-07-09 NOTE — Telephone Encounter (Signed)
Per Dr. Irene Limbo it it ok to treat today with ANC of 1.4

## 2018-07-09 NOTE — Telephone Encounter (Signed)
Appts scheduled AVS/Calendar printed per 8/21 los/ IB message sent to Dr Irene Limbo regarding f/u appt with next treatment/ per 8/21 los

## 2018-07-10 ENCOUNTER — Inpatient Hospital Stay: Payer: Self-pay

## 2018-07-10 DIAGNOSIS — Z7189 Other specified counseling: Secondary | ICD-10-CM

## 2018-07-10 DIAGNOSIS — C8440 Peripheral T-cell lymphoma, not classified, unspecified site: Secondary | ICD-10-CM

## 2018-07-10 MED ORDER — PEGFILGRASTIM INJECTION 6 MG/0.6ML ~~LOC~~
6.0000 mg | PREFILLED_SYRINGE | Freq: Once | SUBCUTANEOUS | Status: AC
  Administered 2018-07-10: 6 mg via SUBCUTANEOUS

## 2018-07-10 NOTE — Patient Instructions (Signed)
Pegfilgrastim injection What is this medicine? PEGFILGRASTIM (PEG fil gra stim) is a long-acting granulocyte colony-stimulating factor that stimulates the growth of neutrophils, a type of white blood cell important in the body's fight against infection. It is used to reduce the incidence of fever and infection in patients with certain types of cancer who are receiving chemotherapy that affects the bone marrow, and to increase survival after being exposed to high doses of radiation. This medicine may be used for other purposes; ask your health care provider or pharmacist if you have questions. COMMON BRAND NAME(S): Neulasta What should I tell my health care provider before I take this medicine? They need to know if you have any of these conditions: -kidney disease -latex allergy -ongoing radiation therapy -sickle cell disease -skin reactions to acrylic adhesives (On-Body Injector only) -an unusual or allergic reaction to pegfilgrastim, filgrastim, other medicines, foods, dyes, or preservatives -pregnant or trying to get pregnant -breast-feeding How should I use this medicine? This medicine is for injection under the skin. If you get this medicine at home, you will be taught how to prepare and give the pre-filled syringe or how to use the On-body Injector. Refer to the patient Instructions for Use for detailed instructions. Use exactly as directed. Tell your healthcare provider immediately if you suspect that the On-body Injector may not have performed as intended or if you suspect the use of the On-body Injector resulted in a missed or partial dose. It is important that you put your used needles and syringes in a special sharps container. Do not put them in a trash can. If you do not have a sharps container, call your pharmacist or healthcare provider to get one. Talk to your pediatrician regarding the use of this medicine in children. While this drug may be prescribed for selected conditions,  precautions do apply. Overdosage: If you think you have taken too much of this medicine contact a poison control center or emergency room at once. NOTE: This medicine is only for you. Do not share this medicine with others. What if I miss a dose? It is important not to miss your dose. Call your doctor or health care professional if you miss your dose. If you miss a dose due to an On-body Injector failure or leakage, a new dose should be administered as soon as possible using a single prefilled syringe for manual use. What may interact with this medicine? Interactions have not been studied. Give your health care provider a list of all the medicines, herbs, non-prescription drugs, or dietary supplements you use. Also tell them if you smoke, drink alcohol, or use illegal drugs. Some items may interact with your medicine. This list may not describe all possible interactions. Give your health care provider a list of all the medicines, herbs, non-prescription drugs, or dietary supplements you use. Also tell them if you smoke, drink alcohol, or use illegal drugs. Some items may interact with your medicine. What should I watch for while using this medicine? You may need blood work done while you are taking this medicine. If you are going to need a MRI, CT scan, or other procedure, tell your doctor that you are using this medicine (On-Body Injector only). What side effects may I notice from receiving this medicine? Side effects that you should report to your doctor or health care professional as soon as possible: -allergic reactions like skin rash, itching or hives, swelling of the face, lips, or tongue -dizziness -fever -pain, redness, or irritation at site   where injected -pinpoint red spots on the skin -red or dark-brown urine -shortness of breath or breathing problems -stomach or side pain, or pain at the shoulder -swelling -tiredness -trouble passing urine or change in the amount of urine Side  effects that usually do not require medical attention (report to your doctor or health care professional if they continue or are bothersome): -bone pain -muscle pain This list may not describe all possible side effects. Call your doctor for medical advice about side effects. You may report side effects to FDA at 1-800-FDA-1088. Where should I keep my medicine? Keep out of the reach of children. Store pre-filled syringes in a refrigerator between 2 and 8 degrees C (36 and 46 degrees F). Do not freeze. Keep in carton to protect from light. Throw away this medicine if it is left out of the refrigerator for more than 48 hours. Throw away any unused medicine after the expiration date. NOTE: This sheet is a summary. It may not cover all possible information. If you have questions about this medicine, talk to your doctor, pharmacist, or health care provider.  2018 Elsevier/Gold Standard (2016-11-01 12:58:03)  

## 2018-07-17 NOTE — Progress Notes (Signed)
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 female who has been referred to [place] by Dr Joni Reining for evaluation and management of her newly diagnosed angioimmunoblastic T cell lymphoma.   Patient has a h/o HTN, endometriosis, tobacco use who was admitted to the hospital with shortness of breath and was subsequently found to have diffuse lymphadenopathy. She was also found to have bilateral pleural effusions and has had a thoracentesis with pleural fluid demonstrating atypical lymphocytosis. She was discharged but returned to the ED with complaints of persisting SOB, abdominal swelling, and lower extremity swelling.   She has had a  CTA chest and CT abd/pelvis which showed no PE.but extensive LNadenopathy in the chest and throughout the abd with b/l pleural effusion and mild ascites. Also noted to have narrowing in b/l common iliac arteries.  Clinically she is noted to have b/l LE edema 1-2+  Most recent lab results (05/05/18) of CBC  is as follows: all values are WNL except for RBC at 2.59, HGB at 7.7, HCT at 22.7, RDW at 16.0, PLT at 89k.  She has had a CT bone marrow biopsy on 05/05/2018 -results pending. ECHO was done and showed  Systolic function was   normal. The estimated ejection fraction was in the range of 60%   to 65%. Wall motion was normal; there were no regional wall   motion abnormalities. Left ventricular diastolic function   parameters were normal.  Port a cath placement requested.  Patient was transferred to [facility] with her consent to receive C1 of chemotherapy as inpatient.  Interval History:   Deborah Cooley returns today for management and  evaluation of her Angioimmunoblastic T cell lymphoma during C4 of her treatment. The patient's last visit with [place] was on 07/09/18. She is accompanied today by her husband. The pt reports that she is doing well overall.   The pt reports that she is feeling cold and tired today. She notes that she has had some bone pains after receiving Neulasta, in her upper ribs. She also notes that she has been more SOB in the last few days, cold as well as tired. She denies any concerns for bleeding whatsoever. The pt notes that she ran out of her Eliquis yesterday and did not take any today.   Lab results today (07/22/18) of CBC w/diff, CMP, and Reticulocytes is as follows: all values are WNL except for WBC at 3.6k, RBC at 1.80, HGB at 5.1, HCT at 14.4, RDW at 14.8, PLT at 110k, Lymphs abs at 500, Potassium at 3.3, Glucose at 195, Retic ct abs at 5.4.   On review of systems, pt reports feeling cold, bone pains, some SOB, tiredness, and denies concerns for bleeding, nose bleeds, gum bleeds, blood in the stools, leg swelling, and any other symptoms.   MEDICAL HISTORY:  Past Medical History:  Diagnosis Date  . Bilateral pleural effusion 04/17/2018  . Endometriosis   . Essential hypertension, benign   . Family history of adverse reaction to anesthesia    "sister stops breathing I think" (04/18/2018)  . Heart murmur   . Mixed hyperlipidemia   . Pneumonia 04/17/2018    SURGICAL HISTORY: Past Surgical History:  Procedure Laterality  Date  . ABDOMINAL HYSTERECTOMY  1970s/1980s   for endometriosis  . Arthroscopic right shoulder surgery    . AXILLARY LYMPH NODE BIOPSY Left 04/21/2018   Procedure: LEFT AXILLARY LYMPH NODE BIOPSY;  Surgeon: Kieth Brightly, Arta Bruce, MD;  Location: [facility];  Service: General;  Laterality: Left;  . CARPAL TUNNEL RELEASE Right   . GANGLION CYST EXCISION Right   . IR IMAGING GUIDED PORT INSERTION  05/07/2018  . MULTIPLE TOOTH EXTRACTIONS    . OOPHORECTOMY  1997  . PARTIAL HYSTERECTOMY  1984    . Right hand surgery    . SHOULDER OPEN ROTATOR CUFF REPAIR Right   . VESICOVAGINAL FISTULA CLOSURE W/ TAH      SOCIAL HISTORY: Social History   Socioeconomic History  . Marital status: Married    Spouse name: Not on file  . Number of children: Not on file  . Years of education: Not on file  . Highest education level: Not on file  Occupational History  . Occupation: Social research officer, government    Comment: works at [employer]  . Financial resource strain: Not on file  . Food insecurity:    Worry: Not on file    Inability: Not on file  . Transportation needs:    Medical: Not on file    Non-medical: Not on file  Tobacco Use  . Smoking status: Current Every Day Smoker    Packs/day: 1.50    Years: 46.00    Pack years: 69.00    Types: Cigarettes  . Smokeless tobacco: Never Used  Substance and Sexual Activity  . Alcohol use: Not Currently    Comment: 04/18/2018 "quit years ago"  . Drug use: Not Currently    Comment: "back in my 41s"  . Sexual activity: Not Currently  Lifestyle  . Physical activity:    Days per week: Not on file    Minutes per session: Not on file  . Stress: Not on file  Relationships  . Social connections:    Talks on phone: Not on file    Gets together: Not on file    Attends religious service: Not on file    Active member of club or organization: Not on file    Attends meetings of clubs or organizations: Not on file    Relationship status: Not on file  . Intimate partner violence:    Fear of current or ex partner: Not on file    Emotionally abused: Not on file    Physically abused: Not on file    Forced sexual activity: Not on file  Other Topics Concern  . Not on file  Social History Narrative   ** Merged History Encounter **       Patient has one son whom is healthy.   Has a live in boyfriend.    FAMILY HISTORY: Family History  Problem Relation Age of Onset  . Diabetes Mother        age 85  . Heart attack Father         died age 33's  . Hypertension Father   . Cancer Sister        breast cancer diagonsed age 7 years  . Diabetes Unknown   . Heart disease Unknown        Female <55  . Arthritis Unknown   . Asthma Unknown   . Hyperlipidemia Other        several siblings  . Thyroid disease Sister  one sister with thyroid disease    ALLERGIES:  is allergic to ambien [zolpidem].  MEDICATIONS:  Current Outpatient Medications  Medication Sig Dispense Refill  . acetaminophen (TYLENOL) 325 MG tablet Take 2 tablets (650 mg total) by mouth every 6 (six) hours as needed for mild pain (or Fever >/= 101).    Marland Kitchen apixaban (ELIQUIS) 2.5 MG TABS tablet Take 1 tablet (2.5 mg total) by mouth 2 (two) times daily. 60 tablet 3  . lidocaine-prilocaine (EMLA) cream Apply a dime size to port-a-cath 1-2 hours prior to access. Cover with saran wrap. 30 g 2  . LORazepam (ATIVAN) 0.5 MG tablet Take 1 tablet (0.5 mg total) by mouth every 8 (eight) hours as needed for anxiety. 30 tablet 0  . Multiple Vitamin (MULTIVITAMIN WITH MINERALS) TABS tablet Take 1 tablet by mouth daily. 30 tablet 0  . ondansetron (ZOFRAN) 8 MG tablet Take 1 tablet (8 mg total) by mouth every 8 (eight) hours as needed for nausea. Start on day 3 after chemotherapy 30 tablet 3  . polyethylene glycol (MIRALAX / GLYCOLAX) packet Take 17 g by mouth daily as needed for mild constipation. 30 each 0  . potassium chloride SA (K-DUR,KLOR-CON) 20 MEQ tablet 40 meq orally twice daily for 2 daily then 72mq PO daily 60 tablet 0  . predniSONE (DELTASONE) 20 MG tablet Take 3 tablets (60 mg total) by mouth daily. Take on days 1-5 of chemotherapy. (Patient taking differently: Take 10 mg by mouth daily. Take on days 1-5 of chemotherapy.) 15 tablet 6  . prochlorperazine (COMPAZINE) 10 MG tablet Take 1 tablet (10 mg total) by mouth every 6 (six) hours as needed for nausea or vomiting. 30 tablet 1   No current facility-administered medications for this visit.     REVIEW OF  SYSTEMS:    A 10+ POINT REVIEW OF SYSTEMS WAS OBTAINED including neurology, dermatology, psychiatry, cardiac, respiratory, lymph, extremities, GI, GU, Musculoskeletal, constitutional, breasts, reproductive, HEENT.  All pertinent positives are noted in the HPI.  All others are negative.   PHYSICAL EXAMINATION: ECOG PERFORMANCE STATUS: 2 - Symptomatic, <50% confined to bed  Vitals:   07/22/18 0925  BP: (!) 92/31  Pulse: 89  Resp: 18  Temp: 97.7 F (36.5 C)  SpO2: 97%   Filed Weights   07/22/18 0925  Weight: 105 lb 11.2 oz (47.9 kg)   .Body mass index is 19.45 kg/m.  GENERAL:alert, in no acute distress and comfortable SKIN: no acute rashes, no significant lesions EYES: conjunctival pallor (+), sclera anicteric OROPHARYNX: MMM, no exudates, no oropharyngeal erythema or ulceration NECK: supple, no JVD LYMPH:  no palpable lymphadenopathy in the cervical, axillary or inguinal regions LUNGS: decreased breath sounds 1/5 up right HEART: regular rate & rhythm ABDOMEN:  normoactive bowel sounds , non tender, not distended. No palpable hepatosplenomegaly.  Extremity: no pedal edema PSYCH: alert & oriented x 3 with fluent speech NEURO: no focal motor/sensory deficits    LABORATORY DATA:  I have reviewed the data as listed  . CBC Latest Ref Rng & Units 07/22/2018 07/09/2018 06/27/2018  WBC 3.9 - 10.3 K/uL 3.6(L) 2.7(L) 1.3(L)  Hemoglobin 11.6 - 15.9 g/dL 5.1(LL) 8.6(L) 7.3(L)  Hematocrit 34.8 - 46.6 % 14.4(L) 24.8(L) 21.3(L)  Platelets 145 - 400 K/uL 110(L) 170 46(L)  ANC 2400  CBC    Component Value Date/Time   WBC 3.6 (L) 07/22/2018 0847   RBC 1.80 (L) 07/22/2018 0847   RBC 1.80 (L) 07/22/2018 0847   HGB 5.1 (LL) 07/22/2018  0847   HGB 9.3 (L) 06/11/2018 0859   HCT 14.4 (L) 07/22/2018 0847   PLT 110 (L) 07/22/2018 0847   PLT 247 06/11/2018 0859   MCV 80.0 07/22/2018 0847   MCH 28.3 07/22/2018 0847   MCHC 35.4 07/22/2018 0847   RDW 14.8 (H) 07/22/2018 0847   LYMPHSABS 0.5  (L) 07/22/2018 0847   MONOABS 0.7 07/22/2018 0847   EOSABS 0.0 07/22/2018 0847   BASOSABS 0.0 07/22/2018 0847    . CMP Latest Ref Rng & Units 07/22/2018 07/09/2018 06/27/2018  Glucose 70 - 99 mg/dL 195(H) 156(H) 110(H)  BUN 8 - 23 mg/dL '10 8 8  ' Creatinine 0.44 - 1.00 mg/dL 0.67 0.60 0.65  Sodium 135 - 145 mmol/L 138 141 135  Potassium 3.5 - 5.1 mmol/L 3.3(L) 3.9 3.6  Chloride 98 - 111 mmol/L 101 105 100  CO2 22 - 32 mmol/L '26 29 26  ' Calcium 8.9 - 10.3 mg/dL 9.5 9.2 10.1  Total Protein 6.5 - 8.1 g/dL 6.5 7.0 7.1  Total Bilirubin 0.3 - 1.2 mg/dL 0.6 0.5 0.9  Alkaline Phos 38 - 126 U/L 82 76 93  AST 15 - 41 U/L '18 25 22  ' ALT 0 - 44 U/L '24 31 27    ' . Lab Results  Component Value Date   LDH 204 (H) 07/09/2018      Component     Latest Ref Rng & Units 04/18/2018 05/03/2018  Hepatitis B Surface Ag     Negative  Negative  HCV Ab     0.0 - 0.9 s/co ratio  <0.1  Hep A Ab, IgM     Negative  Negative  Hep B Core Ab, IgM     Negative  Negative  HIV Screen 4th Generation wRfx     Non Reactive Non Reactive     04/21/18 Molecular Pathology:   04/21/18 Surgical Pathology:    RADIOGRAPHIC STUDIES: I have personally reviewed the radiological images as listed and agreed with the findings in the report. Nm Pet Image Restag (ps) Skull Base To Thigh  Result Date: 07/01/2018 CLINICAL DATA:  Subsequent treatment strategy for T-cell lymphoma. Three cycles of chemotherapy completed. Initial presentation with lymphadenopathy and pleural effusions. EXAM: NUCLEAR MEDICINE PET SKULL BASE TO THIGH TECHNIQUE: 111 mCi F-18 FDG was injected intravenously. Full-ring PET imaging was performed from the skull base to thigh after the radiotracer. CT data was obtained and used for attenuation correction and anatomic localization. Fasting blood glucose: 5.1 mg/dl COMPARISON:  CT 04/18/2018 FINDINGS: Mediastinal blood pool activity: SUV max 2.0 NECK: No hypermetabolic lymph nodes in the neck. Incidental CT  findings: none CHEST: Near complete resolution of bulky RIGHT axillary adenopathy. No hypermetabolic lymph nodes remain. Mild activity in the LEFT axilla likely represents excisional biopsy site. Activity is low with SUV max 1.5. Resolution of mediastinal adenopathy. No hypermetabolic mediastinal lymph nodes ( [place] 1 Incidental CT findings: Port in the anterior chest wall with tip in distal SVC. Resolution of pleural effusions. ABDOMEN/PELVIS: Decrease in size of periaortic lymph nodes. For example lymph node LEFT aorta the level the kidneys measuring 11 mm short axis decreased from 21 mm. No associated metabolic activity. Resolution of the iliac adenopathy. No residual metabolic activity. Likewise resolution of inguinal adenopathy. No residual metabolic activity. The spleen is decreased in size measuring 10.5 cm in craniocaudad dimension compared to 12.3 cm. Incidental CT findings: Post hysterectomy. Atherosclerotic calcification of the aorta. SKELETON: uniform increase in bone marrow activity particularly in the spine. Incidental CT  findings: none IMPRESSION: 1. Resolution of axillary, mediastinal, periaortic, and inguinal lymphadenopathy. ( [place] 1) 2. Decrease in size of spleen.  Normal metabolic activity. 3. Uniform increase in marrow metabolic activity is most consistent with GCSF type response. Electronically Signed   By: Suzy Bouchard M.D.   On: 07/01/2018 14:39    ASSESSMENT & PLAN:   62 y.o. female with  1. 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 S/P C1 OF CHOP on 05/08/2018 S/p C2 of Brentuximab-CHP on 05/28/2018  S/p C3 of Brentuximab-CHP on 06/18/2018 S/p C3 of Brentuximab-CHP on  07/09/2018  07/01/18 PET/CT revealed Resolution of axillary, mediastinal, periaortic, and inguinal lymphadenopathy. ( [place] 1) 2. Decrease in size of spleen.  Normal metabolic activity. 3. Uniform increase in marrow metabolic  activity is most consistent with GCSF type response.   2. General LNadenopathy from lymphoma with b/l pleural effusion and b/l lower extremity weakness- resolved  3. Anemia/thrombocytopenia - from Bone marrow involvement by lymphoma + Chemotherapy BM Bx confirms involved by T cell lymphoma.  4. 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 LDH wnl no evidnece of overt hemolysis at this timne   5. CMV IgM+ --  Component     Latest Ref Rng & Units 05/09/2018  CMV Quant DNA PCR (Plasma)     Negative IU/mL 8,430  Log10 CMV Qn DNA Pl     log10 IU/mL 3.926    6. High risk for TLS - on allopurinol , received Rasburicase. Uric acid normalized Uric acid 16--->8--> 4.6-->3.4--> 3.6  7. Port a cath in situ  8. 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 S/p Acute hypoxic respiratory failure - required intubation --now extubated and with minimal oxygen needs.  ?etiology Re-expansion pulmonary edema post large volume thoracentesis vs aspiration Though the rapidity of presentation is unusual would need to consider HCAP and CMV pneumonitis (CMV IgM+ and PCV positive with worsening). Resolved hypoxia - treated with antibiotics for aspiration pneumonia.  10 . RUE DVT - US 6/25 -- was briefly on IV Heparin but held given thrombocytopenia -- currently on Eliquis  11.. Hypokalemia K 3.3  PLAN: -Recommended that the pt increase her Boost/Ensure consumption and do her best to keep eating -The pt has reached complete metabolic response after 3 cycles of treatment  Discussed pt labwork today, 07/22/18; HGB at 5.1 -Will order 2 units PRBCs today, and 2 units tomorrow -Discussed the option to pursue BM transplant after completing treatment and that I would be happy to refer the pt to [facility] for a discussion with a transplant team  -The pt would like to discuss BM transplant considerations with [facility], will  provide a referral today -Will refill her Eliquis today  -Will proceed with C5 of Brentuximab-CHP in one week as scheduled, and will see pt back in one week -Will continue reduced dose of Cyclophosphamide to 571m/m2 for better blood count tolerance as neutropenia and anemia have remained significant  2 Units of PRBC today 2 units of PRBCs tomorrow RTC for C5 of chemotherapy /labs/MD as scheduled on 9/11    All of the patients questions were answered with apparent satisfaction. The patient knows to call the clinic with any problems, questions or concerns.  The total time spent in the appt was 25 minutes and more than 50% was on counseling and direct patient cares.     Gautam  Irene Limbo MD [place] [facility]AHIV[facility] Hematology/Oncology Physician [facility]  (Office):       [phone] (Work cell):  [phone] (Fax):           [phone]  I, Baldwin Jamaica, am acting as a scribe for Dr. Irene Limbo  .I have reviewed the above documentation for accuracy and completeness, and I agree with the above. Brunetta Genera MD

## 2018-07-22 ENCOUNTER — Other Ambulatory Visit: Payer: Self-pay

## 2018-07-22 ENCOUNTER — Ambulatory Visit (HOSPITAL_COMMUNITY)
Admission: RE | Admit: 2018-07-22 | Discharge: 2018-07-22 | Disposition: A | Discharge status: Home / Self Care | Payer: Self-pay | Source: Ambulatory Visit | Attending: Hematology | Admitting: Hematology

## 2018-07-22 ENCOUNTER — Inpatient Hospital Stay: Payer: Self-pay | Attending: Hematology

## 2018-07-22 ENCOUNTER — Other Ambulatory Visit: Payer: Self-pay | Admitting: *Deleted

## 2018-07-22 ENCOUNTER — Telehealth: Payer: Self-pay

## 2018-07-22 ENCOUNTER — Encounter: Payer: Self-pay | Admitting: Hematology

## 2018-07-22 ENCOUNTER — Inpatient Hospital Stay (HOSPITAL_BASED_OUTPATIENT_CLINIC_OR_DEPARTMENT_OTHER): Payer: Self-pay | Admitting: Hematology

## 2018-07-22 VITALS — BP 92/31 | HR 89 | Temp 97.7°F | Resp 18 | Ht 61.81 in | Wt 105.7 lb

## 2018-07-22 DIAGNOSIS — D649 Anemia, unspecified: Secondary | ICD-10-CM

## 2018-07-22 DIAGNOSIS — I10 Essential (primary) hypertension: Secondary | ICD-10-CM | POA: Insufficient documentation

## 2018-07-22 DIAGNOSIS — Z7901 Long term (current) use of anticoagulants: Secondary | ICD-10-CM

## 2018-07-22 DIAGNOSIS — C8440 Peripheral T-cell lymphoma, not classified, unspecified site: Secondary | ICD-10-CM

## 2018-07-22 DIAGNOSIS — E876 Hypokalemia: Secondary | ICD-10-CM | POA: Insufficient documentation

## 2018-07-22 DIAGNOSIS — C865 Angioimmunoblastic T-cell lymphoma: Secondary | ICD-10-CM

## 2018-07-22 DIAGNOSIS — I82629 Acute embolism and thrombosis of deep veins of unspecified upper extremity: Secondary | ICD-10-CM

## 2018-07-22 DIAGNOSIS — Z79899 Other long term (current) drug therapy: Secondary | ICD-10-CM | POA: Insufficient documentation

## 2018-07-22 DIAGNOSIS — F17210 Nicotine dependence, cigarettes, uncomplicated: Secondary | ICD-10-CM | POA: Insufficient documentation

## 2018-07-22 DIAGNOSIS — Z5111 Encounter for antineoplastic chemotherapy: Secondary | ICD-10-CM | POA: Insufficient documentation

## 2018-07-22 DIAGNOSIS — D696 Thrombocytopenia, unspecified: Secondary | ICD-10-CM

## 2018-07-22 DIAGNOSIS — D702 Other drug-induced agranulocytosis: Secondary | ICD-10-CM

## 2018-07-22 DIAGNOSIS — Z5189 Encounter for other specified aftercare: Secondary | ICD-10-CM | POA: Insufficient documentation

## 2018-07-22 DIAGNOSIS — Z5112 Encounter for antineoplastic immunotherapy: Secondary | ICD-10-CM | POA: Insufficient documentation

## 2018-07-22 DIAGNOSIS — E785 Hyperlipidemia, unspecified: Secondary | ICD-10-CM

## 2018-07-22 DIAGNOSIS — I82621 Acute embolism and thrombosis of deep veins of right upper extremity: Secondary | ICD-10-CM | POA: Insufficient documentation

## 2018-07-22 DIAGNOSIS — D630 Anemia in neoplastic disease: Secondary | ICD-10-CM

## 2018-07-22 LAB — CMP (CANCER CENTER ONLY)
ALK PHOS: 82 U/L (ref 38–126)
ALT: 24 U/L (ref 0–44)
AST: 18 U/L (ref 15–41)
Albumin: 3.6 g/dL (ref 3.5–5.0)
Anion gap: 11 (ref 5–15)
BILIRUBIN TOTAL: 0.6 mg/dL (ref 0.3–1.2)
BUN: 10 mg/dL (ref 8–23)
CALCIUM: 9.5 mg/dL (ref 8.9–10.3)
CHLORIDE: 101 mmol/L (ref 98–111)
CO2: 26 mmol/L (ref 22–32)
CREATININE: 0.67 mg/dL (ref 0.44–1.00)
Glucose, Bld: 195 mg/dL — ABNORMAL HIGH (ref 70–99)
Potassium: 3.3 mmol/L — ABNORMAL LOW (ref 3.5–5.1)
Sodium: 138 mmol/L (ref 135–145)
TOTAL PROTEIN: 6.5 g/dL (ref 6.5–8.1)

## 2018-07-22 LAB — CBC WITH DIFFERENTIAL/PLATELET
Basophils Absolute: 0 10*3/uL (ref 0.0–0.1)
Basophils Relative: 0 %
EOS ABS: 0 10*3/uL (ref 0.0–0.5)
EOS PCT: 0 %
HCT: 14.4 % — ABNORMAL LOW (ref 34.8–46.6)
Hemoglobin: 5.1 g/dL — CL (ref 11.6–15.9)
Lymphocytes Relative: 14 %
Lymphs Abs: 0.5 10*3/uL — ABNORMAL LOW (ref 0.9–3.3)
MCH: 28.3 pg (ref 25.1–34.0)
MCHC: 35.4 g/dL (ref 31.5–36.0)
MCV: 80 fL (ref 79.5–101.0)
MONO ABS: 0.7 10*3/uL (ref 0.1–0.9)
MONOS PCT: 18 %
Neutro Abs: 2.4 10*3/uL (ref 1.5–6.5)
Neutrophils Relative %: 68 %
Platelets: 110 10*3/uL — ABNORMAL LOW (ref 145–400)
RBC: 1.8 MIL/uL — ABNORMAL LOW (ref 3.70–5.45)
RDW: 14.8 % — AB (ref 11.2–14.5)
WBC: 3.6 10*3/uL — ABNORMAL LOW (ref 3.9–10.3)

## 2018-07-22 LAB — SAMPLE TO BLOOD BANK

## 2018-07-22 LAB — RETICULOCYTES: RBC.: 1.8 MIL/uL — ABNORMAL LOW (ref 3.70–5.45)

## 2018-07-22 LAB — PREPARE RBC (CROSSMATCH)

## 2018-07-22 MED ORDER — HEPARIN SOD (PORK) LOCK FLUSH 100 UNIT/ML IV SOLN: 250.0000 [IU] | INTRAVENOUS | Status: DC | PRN

## 2018-07-22 MED ORDER — DIPHENHYDRAMINE HCL 25 MG PO CAPS
25.0000 mg | ORAL_CAPSULE | Freq: Once | ORAL | Status: AC
  Administered 2018-07-22: 25 mg via ORAL
  Filled 2018-07-22: qty 1

## 2018-07-22 MED ORDER — SODIUM CHLORIDE 0.9% FLUSH: 10.0000 mL | INTRAVENOUS | Status: DC | PRN

## 2018-07-22 MED ORDER — ACETAMINOPHEN 325 MG PO TABS
650.0000 mg | ORAL_TABLET | Freq: Once | ORAL | Status: AC
  Administered 2018-07-22: 650 mg via ORAL
  Filled 2018-07-22: qty 2

## 2018-07-22 MED ORDER — SODIUM CHLORIDE 0.9% FLUSH: 3.0000 mL | INTRAVENOUS | Status: DC | PRN

## 2018-07-22 MED ORDER — SODIUM CHLORIDE 0.9% IV SOLUTION: 250.0000 mL | Freq: Once | INTRAVENOUS | Status: DC

## 2018-07-22 MED ORDER — POTASSIUM CHLORIDE CRYS ER 20 MEQ PO TBCR: EXTENDED_RELEASE_TABLET | ORAL | 0 refills | Status: DC

## 2018-07-22 MED ORDER — METHYLPREDNISOLONE SODIUM SUCC 125 MG IJ SOLR
40.0000 mg | Freq: Once | INTRAMUSCULAR | Status: AC
  Administered 2018-07-22: 40 mg via INTRAVENOUS
  Filled 2018-07-22: qty 2

## 2018-07-22 MED ORDER — HEPARIN SOD (PORK) LOCK FLUSH 100 UNIT/ML IV SOLN
500.0000 [IU] | Freq: Every day | INTRAVENOUS | Status: AC | PRN
  Administered 2018-07-22: 500 [IU]
  Filled 2018-07-22: qty 5

## 2018-07-22 MED ORDER — APIXABAN 2.5 MG PO TABS: 2.5000 mg | ORAL_TABLET | Freq: Two times a day (BID) | ORAL | 3 refills | Status: DC

## 2018-07-22 MED FILL — ELIQUIS 2.5 MG TABLET: 2.5 | 30 days supply | Qty: 60 | Fill #1

## 2018-07-22 MED FILL — POTASSIUM CL ER 20 MEQ TABL: 20 | 54 days supply | Qty: 60 | Fill #0

## 2018-07-22 NOTE — Telephone Encounter (Signed)
Dr. Irene Limbo made aware of critical hemoglobin of 5.1

## 2018-07-22 NOTE — Progress Notes (Signed)
PATIENT CARE CENTER NOTE  Diagnosis: Anemia    Provider: Dr. Irene Limbo   Procedure: 2 units PRBC's    Note: Patient received 2 units of blood. Patient tolerated transfusion well with no adverse reaction. Vital signs remained stable. Discharge instructions given to patient. Patient alert, oriented and ambulatory at discharge.

## 2018-07-22 NOTE — Discharge Instructions (Signed)
Blood Transfusion, Adult, Care After This sheet gives you information about how to care for yourself after your procedure. Your health care provider may also give you more specific instructions. If you have problems or questions, contact your health care provider. What can I expect after the procedure? After your procedure, it is common to have:  Bruising and soreness where the IV tube was inserted.  Headache.  Follow these instructions at home:  Take over-the-counter and prescription medicines only as told by your health care provider.  Return to your normal activities as told by your health care provider.  Follow instructions from your health care provider about how to take care of your IV insertion site. Make sure you: ? Wash your hands with soap and water before you change your bandage (dressing). If soap and water are not available, use hand sanitizer. ? Change your dressing as told by your health care provider.  Check your IV insertion site every day for signs of infection. Check for: ? More redness, swelling, or pain. ? More fluid or blood. ? Warmth. ? Pus or a bad smell. Contact a health care provider if:  You have more redness, swelling, or pain around the IV insertion site.  You have more fluid or blood coming from the IV insertion site.  Your IV insertion site feels warm to the touch.  You have pus or a bad smell coming from the IV insertion site.  Your urine turns pink, red, or brown.  You feel weak after doing your normal activities. Get help right away if:  You have signs of a serious allergic or immune system reaction, including: ? Itchiness. ? Hives. ? Trouble breathing. ? Anxiety. ? Chest or lower back pain. ? Fever, flushing, and chills. ? Rapid pulse. ? Rash. ? Diarrhea. ? Vomiting. ? Dark urine. ? Serious headache. ? Dizziness. ? Stiff neck. ? Yellow coloration of the face or the white parts of the eyes (jaundice). This information is not  intended to replace advice given to you by your health care provider. Make sure you discuss any questions you have with your health care provider. Document Released: 11/26/2014 Document Revised: 07/04/2016 Document Reviewed: 05/21/2016 Elsevier Interactive Patient Education  2018 [employer]  

## 2018-07-24 ENCOUNTER — Telehealth: Payer: Self-pay

## 2018-07-24 ENCOUNTER — Inpatient Hospital Stay: Payer: Self-pay

## 2018-07-24 DIAGNOSIS — C8440 Peripheral T-cell lymphoma, not classified, unspecified site: Secondary | ICD-10-CM

## 2018-07-24 DIAGNOSIS — D649 Anemia, unspecified: Secondary | ICD-10-CM

## 2018-07-24 MED ORDER — SODIUM CHLORIDE 0.9% IV SOLUTION
250.0000 mL | Freq: Once | INTRAVENOUS | Status: AC
  Administered 2018-07-24: 250 mL via INTRAVENOUS
  Filled 2018-07-24: qty 250

## 2018-07-24 MED ORDER — ACETAMINOPHEN 325 MG PO TABS
650.0000 mg | ORAL_TABLET | Freq: Once | ORAL | Status: AC
  Administered 2018-07-24: 650 mg via ORAL

## 2018-07-24 MED ORDER — METHYLPREDNISOLONE SODIUM SUCC 40 MG IJ SOLR
INTRAMUSCULAR | Status: AC
  Filled 2018-07-24: qty 1

## 2018-07-24 MED ORDER — SODIUM CHLORIDE 0.9% FLUSH
10.0000 mL | INTRAVENOUS | Status: DC | PRN
  Filled 2018-07-24: qty 10

## 2018-07-24 MED ORDER — HEPARIN SOD (PORK) LOCK FLUSH 100 UNIT/ML IV SOLN
500.0000 [IU] | Freq: Every day | INTRAVENOUS | Status: DC | PRN
  Filled 2018-07-24: qty 5

## 2018-07-24 MED ORDER — METHYLPREDNISOLONE SODIUM SUCC 40 MG IJ SOLR
40.0000 mg | Freq: Once | INTRAMUSCULAR | Status: AC
  Administered 2018-07-24: 40 mg via INTRAVENOUS

## 2018-07-24 MED ORDER — ACETAMINOPHEN 325 MG PO TABS
ORAL_TABLET | ORAL | Status: AC
  Filled 2018-07-24: qty 2

## 2018-07-24 NOTE — Telephone Encounter (Signed)
In-basket sent to provider and desk RN relaying pt request for options other than eliquis d/t insurance coverage.

## 2018-07-24 NOTE — Patient Instructions (Signed)

## 2018-07-24 NOTE — Telephone Encounter (Signed)
Per 9/3 los already scheduled °

## 2018-07-25 LAB — TYPE AND SCREEN
ABO/RH(D): O POS
Antibody Screen: NEGATIVE
UNIT DIVISION: 0
UNIT DIVISION: 0
Unit division: 0
Unit division: 0

## 2018-07-25 LAB — BPAM RBC
BLOOD PRODUCT EXPIRATION DATE: 201910012359
BLOOD PRODUCT EXPIRATION DATE: 201910012359
Blood Product Expiration Date: 201910012359
Blood Product Expiration Date: 201910012359
ISSUE DATE / TIME: 201909031138
ISSUE DATE / TIME: 201909031138
ISSUE DATE / TIME: 201909050754
ISSUE DATE / TIME: 201909050754
UNIT TYPE AND RH: 5100
Unit Type and Rh: 5100
Unit Type and Rh: 5100
Unit Type and Rh: 5100

## 2018-07-28 ENCOUNTER — Telehealth: Payer: Self-pay | Admitting: Hematology

## 2018-07-28 NOTE — Telephone Encounter (Signed)
Appts added and patient notified per 8/21 los

## 2018-07-30 ENCOUNTER — Ambulatory Visit: Payer: Self-pay

## 2018-07-30 ENCOUNTER — Inpatient Hospital Stay: Payer: Self-pay

## 2018-07-30 ENCOUNTER — Inpatient Hospital Stay (HOSPITAL_BASED_OUTPATIENT_CLINIC_OR_DEPARTMENT_OTHER): Payer: Self-pay | Admitting: Hematology

## 2018-07-30 ENCOUNTER — Telehealth: Payer: Self-pay

## 2018-07-30 VITALS — BP 111/54 | HR 76 | Temp 98.6°F | Resp 14

## 2018-07-30 DIAGNOSIS — F17210 Nicotine dependence, cigarettes, uncomplicated: Secondary | ICD-10-CM

## 2018-07-30 DIAGNOSIS — E440 Moderate protein-calorie malnutrition: Secondary | ICD-10-CM

## 2018-07-30 DIAGNOSIS — D702 Other drug-induced agranulocytosis: Secondary | ICD-10-CM

## 2018-07-30 DIAGNOSIS — C8440 Peripheral T-cell lymphoma, not classified, unspecified site: Secondary | ICD-10-CM

## 2018-07-30 DIAGNOSIS — D696 Thrombocytopenia, unspecified: Secondary | ICD-10-CM

## 2018-07-30 DIAGNOSIS — C865 Angioimmunoblastic T-cell lymphoma: Secondary | ICD-10-CM

## 2018-07-30 DIAGNOSIS — E876 Hypokalemia: Secondary | ICD-10-CM

## 2018-07-30 DIAGNOSIS — I10 Essential (primary) hypertension: Secondary | ICD-10-CM

## 2018-07-30 DIAGNOSIS — Z7189 Other specified counseling: Secondary | ICD-10-CM

## 2018-07-30 DIAGNOSIS — D630 Anemia in neoplastic disease: Secondary | ICD-10-CM

## 2018-07-30 DIAGNOSIS — D649 Anemia, unspecified: Secondary | ICD-10-CM

## 2018-07-30 LAB — CBC WITH DIFFERENTIAL/PLATELET
BASOS PCT: 0 %
Basophils Absolute: 0 10*3/uL (ref 0.0–0.1)
EOS ABS: 0 10*3/uL (ref 0.0–0.5)
EOS PCT: 0 %
HCT: 33.3 % — ABNORMAL LOW (ref 34.8–46.6)
Hemoglobin: 11.1 g/dL — ABNORMAL LOW (ref 11.6–15.9)
Lymphocytes Relative: 16 %
Lymphs Abs: 0.8 10*3/uL — ABNORMAL LOW (ref 0.9–3.3)
MCH: 28.8 pg (ref 25.1–34.0)
MCHC: 33.3 g/dL (ref 31.5–36.0)
MCV: 86.5 fL (ref 79.5–101.0)
MONO ABS: 1.2 10*3/uL — AB (ref 0.1–0.9)
Monocytes Relative: 25 %
Neutro Abs: 2.7 10*3/uL (ref 1.5–6.5)
Neutrophils Relative %: 59 %
PLATELETS: 129 10*3/uL — AB (ref 145–400)
RBC: 3.85 MIL/uL (ref 3.70–5.45)
RDW: 14.8 % — AB (ref 11.2–14.5)
WBC: 4.6 10*3/uL (ref 3.9–10.3)

## 2018-07-30 LAB — CMP (CANCER CENTER ONLY)
ALBUMIN: 3.8 g/dL (ref 3.5–5.0)
ALK PHOS: 80 U/L (ref 38–126)
ALT: 20 U/L (ref 0–44)
AST: 21 U/L (ref 15–41)
Anion gap: 9 (ref 5–15)
BUN: 17 mg/dL (ref 8–23)
CALCIUM: 9.7 mg/dL (ref 8.9–10.3)
CO2: 26 mmol/L (ref 22–32)
Chloride: 103 mmol/L (ref 98–111)
Creatinine: 0.7 mg/dL (ref 0.44–1.00)
GFR, Est AFR Am: >60 mL/min (ref >60)
GLUCOSE: 123 mg/dL — AB (ref 70–99)
POTASSIUM: 4.4 mmol/L (ref 3.5–5.1)
Sodium: 138 mmol/L (ref 135–145)
Total Bilirubin: 0.7 mg/dL (ref 0.3–1.2)
Total Protein: 7.1 g/dL (ref 6.5–8.1)

## 2018-07-30 LAB — LACTATE DEHYDROGENASE: LDH: 223 U/L — AB (ref 98–192)

## 2018-07-30 MED ORDER — PALONOSETRON HCL INJECTION 0.25 MG/5ML
INTRAVENOUS | Status: AC
  Filled 2018-07-30: qty 5

## 2018-07-30 MED ORDER — PALONOSETRON HCL INJECTION 0.25 MG/5ML
0.2500 mg | Freq: Once | INTRAVENOUS | Status: AC
  Administered 2018-07-30: 0.25 mg via INTRAVENOUS

## 2018-07-30 MED ORDER — SODIUM CHLORIDE 0.9 % IV SOLN
500.0000 mg/m2 | Freq: Once | INTRAVENOUS | Status: AC
  Administered 2018-07-30: 720 mg via INTRAVENOUS
  Filled 2018-07-30: qty 36

## 2018-07-30 MED ORDER — PREDNISONE 20 MG PO TABS: 60.0000 mg | ORAL_TABLET | Freq: Every day | ORAL | 0 refills | Status: DC

## 2018-07-30 MED ORDER — DOXORUBICIN HCL CHEMO IV INJECTION 2 MG/ML
50.0000 mg/m2 | Freq: Once | INTRAVENOUS | Status: AC
  Administered 2018-07-30: 72 mg via INTRAVENOUS
  Filled 2018-07-30: qty 36

## 2018-07-30 MED ORDER — HEPARIN SOD (PORK) LOCK FLUSH 100 UNIT/ML IV SOLN
500.0000 [IU] | Freq: Once | INTRAVENOUS | Status: AC | PRN
  Administered 2018-07-30: 500 [IU]
  Filled 2018-07-30: qty 5

## 2018-07-30 MED ORDER — SODIUM CHLORIDE 0.9% FLUSH
10.0000 mL | INTRAVENOUS | Status: DC | PRN
  Administered 2018-07-30: 10 mL
  Filled 2018-07-30: qty 10

## 2018-07-30 MED ORDER — DEXAMETHASONE SODIUM PHOSPHATE 10 MG/ML IJ SOLN
10.0000 mg | Freq: Once | INTRAMUSCULAR | Status: AC
  Administered 2018-07-30: 10 mg via INTRAVENOUS

## 2018-07-30 MED ORDER — DEXAMETHASONE SODIUM PHOSPHATE 10 MG/ML IJ SOLN
INTRAMUSCULAR | Status: AC
  Filled 2018-07-30: qty 1

## 2018-07-30 MED ORDER — SODIUM CHLORIDE 0.9 % IV SOLN
1.9000 mg/kg | Freq: Once | INTRAVENOUS | Status: AC
  Administered 2018-07-30: 90 mg via INTRAVENOUS
  Filled 2018-07-30: qty 18

## 2018-07-30 MED ORDER — SODIUM CHLORIDE 0.9 % IV SOLN
Freq: Once | INTRAVENOUS | Status: AC
  Administered 2018-07-30: 10:00:00 via INTRAVENOUS
  Filled 2018-07-30: qty 250

## 2018-07-30 NOTE — Progress Notes (Signed)
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 female who has been referred to [place] by Dr Joni Reining for evaluation and management of her newly diagnosed angioimmunoblastic T cell lymphoma.   Patient has a h/o HTN, endometriosis, tobacco use who was admitted to the hospital with shortness of breath and was subsequently found to have diffuse lymphadenopathy. She was also found to have bilateral pleural effusions and has had a thoracentesis with pleural fluid demonstrating atypical lymphocytosis. She was discharged but returned to the ED with complaints of persisting SOB, abdominal swelling, and lower extremity swelling.   She has had a  CTA chest and CT abd/pelvis which showed no PE.but extensive LNadenopathy in the chest and throughout the abd with b/l pleural effusion and mild ascites. Also noted to have narrowing in b/l common iliac arteries.  Clinically she is noted to have b/l LE edema 1-2+  Most recent lab results (05/05/18) of CBC  is as follows: all values are WNL except for RBC at 2.59, HGB at 7.7, HCT at 22.7, RDW at 16.0, PLT at 89k.  She has had a CT bone marrow biopsy on 05/05/2018 -results pending. ECHO was done and showed  Systolic function was   normal. The estimated ejection fraction was in the range of 60%   to 65%. Wall motion was normal; there were no regional wall   motion abnormalities. Left ventricular diastolic function   parameters were normal.  Port a cath placement requested.  Patient was transferred to [facility] with her consent to receive C1 of chemotherapy as inpatient.  Interval History:   Deborah Cooley returns today for management and  evaluation of her Angioimmunoblastic T cell lymphoma and C5D1 of her treatment. The patient's last visit with [place] was on 07/22/18. She is accompanied today by her husband. The pt reports that she is doing well overall.   The pt reports that her energy levels are much better and she has developed no new concerns in the past week. She denies any concerns for infections.   She also notes that she is not able to afford her refill of Eliquis. She stopped taking Eliquis a week ago when she ran out. She was told that she has a grant for this medication and can pick it up from the [facility] outpatient pharmacy.   She was also not able to begin discussion for a BM transplant at [facility] yet as she is uninsured.   Lab results today (07/30/18) of CBC w/diff, CMP is as follows: all values are WNL except for HGB at 11.1, HCT at 33.3, RDW at 14.8, PLT at 129k, Lymphs abs at 800, Monocytes abs at 1.2k, Glucose at 123. 07/30/18 LDH at 223  On review of systems, pt reports improved energy levels, and denies concerns for infections, abdominal pains, leg swelling, SOB, and any other symptoms.   MEDICAL HISTORY:  Past Medical History:  Diagnosis Date  . Bilateral pleural effusion 04/17/2018  . Endometriosis   . Essential hypertension, benign   . Family history of adverse reaction to anesthesia    "sister stops breathing I think" (04/18/2018)  . Heart murmur   . Mixed hyperlipidemia   . Pneumonia 04/17/2018  SURGICAL HISTORY: Past Surgical History:  Procedure Laterality Date  . ABDOMINAL HYSTERECTOMY  1970s/1980s   for endometriosis  . Arthroscopic right shoulder surgery    . AXILLARY LYMPH NODE BIOPSY Left 04/21/2018   Procedure: LEFT AXILLARY LYMPH NODE BIOPSY;  Surgeon: Kieth Brightly, Arta Bruce, MD;  Location: [place];  Service: General;  Laterality: Left;  . CARPAL TUNNEL RELEASE Right   . GANGLION CYST EXCISION Right   . IR IMAGING GUIDED PORT INSERTION  05/07/2018  . MULTIPLE TOOTH EXTRACTIONS    .  OOPHORECTOMY  1997  . PARTIAL HYSTERECTOMY  1984  . Right hand surgery    . SHOULDER OPEN ROTATOR CUFF REPAIR Right   . VESICOVAGINAL FISTULA CLOSURE W/ TAH      SOCIAL HISTORY: Social History   Socioeconomic History  . Marital status: Married    Spouse name: Not on file  . Number of children: Not on file  . Years of education: Not on file  . Highest education level: Not on file  Occupational History  . Occupation: Social research officer, government    Comment: works at [employer]  . Financial resource strain: Not on file  . Food insecurity:    Worry: Not on file    Inability: Not on file  . Transportation needs:    Medical: Not on file    Non-medical: Not on file  Tobacco Use  . Smoking status: Current Every Day Smoker    Packs/day: 1.50    Years: 46.00    Pack years: 69.00    Types: Cigarettes  . Smokeless tobacco: Never Used  Substance and Sexual Activity  . Alcohol use: Not Currently    Comment: 04/18/2018 "quit years ago"  . Drug use: Not Currently    Comment: "back in my 30s"  . Sexual activity: Not Currently  Lifestyle  . Physical activity:    Days per week: Not on file    Minutes per session: Not on file  . Stress: Not on file  Relationships  . Social connections:    Talks on phone: Not on file    Gets together: Not on file    Attends religious service: Not on file    Active member of club or organization: Not on file    Attends meetings of clubs or organizations: Not on file    Relationship status: Not on file  . Intimate partner violence:    Fear of current or ex partner: Not on file    Emotionally abused: Not on file    Physically abused: Not on file    Forced sexual activity: Not on file  Other Topics Concern  . Not on file  Social History Narrative   ** Merged History Encounter **       Patient has one son whom is healthy.   Has a live in boyfriend.    FAMILY HISTORY: Family History  Problem Relation Age of Onset  . Diabetes Mother         age 22  . Heart attack Father        died age 108's  . Hypertension Father   . Cancer Sister        breast cancer diagonsed age 27 years  . Diabetes Unknown   . Heart disease Unknown        Female <55  . Arthritis Unknown   . Asthma Unknown   . Hyperlipidemia Other        several siblings  . Thyroid  disease Sister        one sister with thyroid disease    ALLERGIES:  is allergic to [place] [zolpidem].  MEDICATIONS:  Current Outpatient Medications  Medication Sig Dispense Refill  . acetaminophen (TYLENOL) 325 MG tablet Take 2 tablets (650 mg total) by mouth every 6 (six) hours as needed for mild pain (or Fever >/= 101).    Marland Kitchen apixaban (ELIQUIS) 2.5 MG TABS tablet Take 1 tablet (2.5 mg total) by mouth 2 (two) times daily. 60 tablet 3  . lidocaine-prilocaine (EMLA) cream Apply a dime size to port-a-cath 1-2 hours prior to access. Cover with saran wrap. 30 g 2  . LORazepam (ATIVAN) 0.5 MG tablet Take 1 tablet (0.5 mg total) by mouth every 8 (eight) hours as needed for anxiety. 30 tablet 0  . Multiple Vitamin (MULTIVITAMIN WITH MINERALS) TABS tablet Take 1 tablet by mouth daily. 30 tablet 0  . ondansetron (ZOFRAN) 8 MG tablet Take 1 tablet (8 mg total) by mouth every 8 (eight) hours as needed for nausea. Start on day 3 after chemotherapy 30 tablet 3  . polyethylene glycol (MIRALAX / GLYCOLAX) packet Take 17 g by mouth daily as needed for mild constipation. 30 each 0  . potassium chloride SA (K-DUR,KLOR-CON) 20 MEQ tablet 40 meq orally twice daily for 2 daily then 64mq PO daily 60 tablet 0  . predniSONE (DELTASONE) 20 MG tablet Take 3 tablets (60 mg total) by mouth daily. Take on days 1-5 of chemotherapy. (Patient taking differently: Take 10 mg by mouth daily. Take on days 1-5 of chemotherapy.) 15 tablet 6  . prochlorperazine (COMPAZINE) 10 MG tablet Take 1 tablet (10 mg total) by mouth every 6 (six) hours as needed for nausea or vomiting. 30 tablet 1   No current facility-administered  medications for this visit.    Facility-Administered Medications Ordered in Other Visits  Medication Dose Route Frequency Provider Last Rate Last Dose  . brentuximab vedotin (ADCETRIS) 90 mg in sodium chloride 0.9 % 100 mL chemo infusion  1.9 mg/kg (Order-Specific) Intravenous Once KBrunetta Genera MD      . cyclophosphamide (CYTOXAN) 720 mg in sodium chloride 0.9 % 250 mL chemo infusion  500 mg/m2 (Order-Specific) Intravenous Once KBrunetta Genera MD      . DOXOrubicin (ADRIAMYCIN) chemo injection 72 mg  50 mg/m2 (Order-Specific) Intravenous Once KBrunetta Genera MD   72 mg at 07/30/18 1126  . heparin lock flush 100 unit/mL  500 Units Intracatheter Once PRN KBrunetta Genera MD      . sodium chloride flush (NS) 0.9 % injection 10 mL  10 mL Intracatheter PRN KBrunetta Genera MD        REVIEW OF SYSTEMS:    A 10+ POINT REVIEW OF SYSTEMS WAS OBTAINED including neurology, dermatology, psychiatry, cardiac, respiratory, lymph, extremities, GI, GU, Musculoskeletal, constitutional, breasts, reproductive, HEENT.  All pertinent positives are noted in the HPI.  All others are negative.   PHYSICAL EXAMINATION: ECOG PERFORMANCE STATUS: 2 - Symptomatic, <50% confined to bed  VS reviewed in EPIC GENERAL:alert, in no acute distress and comfortable SKIN: no acute rashes, no significant lesions EYES: conjunctival pallor (+), sclera anicteric OROPHARYNX: MMM, no exudates, no oropharyngeal erythema or ulceration NECK: supple, no JVD LYMPH:  no palpable lymphadenopathy in the cervical, axillary or inguinal regions LUNGS:decreased breath sounds 1/5 up right  HEART: regular rate & rhythm ABDOMEN:  normoactive bowel sounds , non tender, not distended. No palpable hepatosplenomegaly.  Extremity: no pedal edema PSYCH:  alert & oriented x 3 with fluent speech NEURO: no focal motor/sensory deficits   LABORATORY DATA:  I have reviewed the data as listed  . CBC Latest Ref Rng & Units  07/30/2018 07/22/2018 07/09/2018  WBC 3.9 - 10.3 K/uL 4.6 3.6(L) 2.7(L)  Hemoglobin 11.6 - 15.9 g/dL 11.1(L) 5.1(LL) 8.6(L)  Hematocrit 34.8 - 46.6 % 33.3(L) 14.4(L) 24.8(L)  Platelets 145 - 400 K/uL 129(L) 110(L) 170  ANC 2400  CBC    Component Value Date/Time   WBC 4.6 07/30/2018 0903   RBC 3.85 07/30/2018 0903   HGB 11.1 (L) 07/30/2018 0903   HGB 9.3 (L) 06/11/2018 0859   HCT 33.3 (L) 07/30/2018 0903   PLT 129 (L) 07/30/2018 0903   PLT 247 06/11/2018 0859   MCV 86.5 07/30/2018 0903   MCH 28.8 07/30/2018 0903   MCHC 33.3 07/30/2018 0903   RDW 14.8 (H) 07/30/2018 0903   LYMPHSABS 0.8 (L) 07/30/2018 0903   MONOABS 1.2 (H) 07/30/2018 0903   EOSABS 0.0 07/30/2018 0903   BASOSABS 0.0 07/30/2018 0903    . CMP Latest Ref Rng & Units 07/30/2018 07/22/2018 07/09/2018  Glucose 70 - 99 mg/dL 123(H) 195(H) 156(H)  BUN 8 - 23 mg/dL _0 Creatinine 0.44 - 1.00 mg/dL 0.70 0.67 0.60  Sodium 135 - 145 mmol/L 138 138 141  Potassium 3.5 - 5.1 mmol/L 4.4 3.3(L) 3.9  Chloride 98 - 111 mmol/L 103 101 105  CO2 22 - 32 mmol/L _1 Calcium 8.9 - 10.3 mg/dL 9.7 9.5 9.2  Total Protein 6.5 - 8.1 g/dL 7.1 6.5 7.0  Total Bilirubin 0.3 - 1.2 mg/dL 0.7 0.6 0.5  Alkaline Phos 38 - 126 U/L 80 82 76  AST 15 - 41 U/L _2 ALT 0 - 44 U/L _3 . Lab Results  Component Value Date   LDH 223 (H) 07/30/2018      Component     Latest Ref Rng & Units 04/18/2018 05/03/2018  Hepatitis B Surface Ag     Negative  Negative  HCV Ab     0.0 - 0.9 s/co ratio  <0.1  Hep A Ab, IgM     Negative  Negative  Hep B Core Ab, IgM     Negative  Negative  HIV Screen 4th Generation wRfx     Non Reactive Non Reactive     04/21/18 Molecular Pathology:   04/21/18 Surgical Pathology:    RADIOGRAPHIC STUDIES: I have personally reviewed the radiological images as listed and agreed with the findings in the report. Nm Pet Image Restag (ps) Skull Base To Thigh  Result Date: 07/01/2018 CLINICAL DATA:   Subsequent treatment strategy for T-cell lymphoma. Three cycles of chemotherapy completed. Initial presentation with lymphadenopathy and pleural effusions. EXAM: NUCLEAR MEDICINE PET SKULL BASE TO THIGH TECHNIQUE: 111 mCi F-18 FDG was injected intravenously. Full-ring PET imaging was performed from the skull base to thigh after the radiotracer. CT data was obtained and used for attenuation correction and anatomic localization. Fasting blood glucose: 5.1 mg/dl COMPARISON:  CT 04/18/2018 FINDINGS: Mediastinal blood pool activity: SUV max 2.0 NECK: No hypermetabolic lymph nodes in the neck. Incidental CT findings: none CHEST: Near complete resolution of bulky RIGHT axillary adenopathy. No hypermetabolic lymph nodes remain. Mild activity in the LEFT axilla likely represents excisional biopsy site. Activity is low with SUV max 1.5. Resolution of mediastinal adenopathy. No hypermetabolic mediastinal lymph nodes ( [place] 1 Incidental CT findings: [employer]  in the anterior chest wall with tip in distal SVC. Resolution of pleural effusions. ABDOMEN/PELVIS: Decrease in size of periaortic lymph nodes. For example lymph node LEFT aorta the level the kidneys measuring 11 mm short axis decreased from 21 mm. No associated metabolic activity. Resolution of the iliac adenopathy. No residual metabolic activity. Likewise resolution of inguinal adenopathy. No residual metabolic activity. The spleen is decreased in size measuring 10.5 cm in craniocaudad dimension compared to 12.3 cm. Incidental CT findings: Post hysterectomy. Atherosclerotic calcification of the aorta. SKELETON: uniform increase in bone marrow activity particularly in the spine. Incidental CT findings: none IMPRESSION: 1. Resolution of axillary, mediastinal, periaortic, and inguinal lymphadenopathy. ( Deauville 1) 2. Decrease in size of spleen.  Normal metabolic activity. 3. Uniform increase in marrow metabolic activity is most consistent with GCSF type response.  Electronically Signed   By: Suzy Bouchard M.D.   On: 07/01/2018 14:39    ASSESSMENT & PLAN:   62 y.o. female with  1. 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 S/P C1 OF CHOP on 05/08/2018 S/p C2 of Brentuximab-CHP on 05/28/2018  S/p C3 of Brentuximab-CHP on 06/18/2018 S/p C3 of Brentuximab-CHP on  07/09/2018  07/01/18 PET/CT revealed Resolution of axillary, mediastinal, periaortic, and inguinal lymphadenopathy. ( Deauville 1) 2. Decrease in size of spleen.  Normal metabolic activity. 3. Uniform increase in marrow metabolic activity is most consistent with GCSF type response.   2. General LNadenopathy from lymphoma with b/l pleural effusion and b/l lower extremity weakness- resolved  3. Anemia/thrombocytopenia - from Bone marrow involvement by lymphoma + Chemotherapy BM Bx confirms involved by T cell lymphoma. hgb better after PRBC transfusion.  4. 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 LDH wnl no evidnece of overt hemolysis at this timne   5. CMV IgM+ --  Component     Latest Ref Rng & Units 05/09/2018  CMV Quant DNA PCR (Plasma)     Negative IU/mL 8,430  Log10 CMV Qn DNA Pl     log10 IU/mL 3.926    7. Port a cath in situ  8. 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 S/p Acute hypoxic respiratory failure - required intubation --now extubated and with minimal oxygen needs.  ?etiology Re-expansion pulmonary edema post large volume thoracentesis vs aspiration Though the rapidity of presentation is unusual would need to consider HCAP and CMV pneumonitis (CMV IgM+ and PCV positive with worsening). Resolved hypoxia - treated with antibiotics for aspiration pneumonia.  10 . RUE DVT - US 6/25 -- was briefly on IV Heparin but held given thrombocytopenia -- currently on Eliquis  11.. Hypokalemia K  3.3  PLAN: -Recommended that the pt increase her Boost/Ensure consumption and do her best to keep eating -The pt has reached complete metabolic response after 3 cycles of treatment  -Discussed the option to pursue BM transplant after completing treatment and that I would be happy to refer the pt to [facility] for a discussion with a transplant team - this was done but not possible due to lack of insurance. -Discussed pt labwork today, 07/30/18; HGB much improved to 11.1, blood counts and chemistries are stable  -Will refer pt to our Education officer, museum for insurance considerations and financial considerations -Again stressed the importance of the pt establishing care with a PCP. 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 -The pt has no prohibitive toxicities from continuing C5 at this time.     RTC with Dr Irene Limbo in 12 days with labs SW consultation for multiple financial issues. Please schedule C6 of treatment per orders in 3 weeks with labs and MD visit    All of the patients questions were answered with apparent satisfaction. The patient knows to call the clinic with any problems, questions or concerns.  The total time spent in the appt was 25 minutes and more than 50% was on counseling and direct patient cares.    Sullivan Lone MD MS AAHIVMS [facility] Hematology/Oncology Physician [facility]  (Office):       [phone] (Work cell):  [phone] (Fax):           [phone]  I, Baldwin Jamaica, am acting as a scribe for Dr. Irene Limbo  .I have reviewed the above documentation for accuracy and completeness, and I agree with the above. Brunetta Genera MD

## 2018-07-30 NOTE — Telephone Encounter (Signed)
Spoke with the patient and asked if she could stop by and pick up her new schedule. There was some new appointment added. Per 9/11 los

## 2018-07-30 NOTE — Telephone Encounter (Signed)
Informed patient that Eliquis is ready for pick up at the [facility] and she does not have a co-pay. Patient verbalized understanding.

## 2018-07-30 NOTE — Patient Instructions (Signed)
  [place] Discharge Instructions for Patients Receiving Chemotherapy  Today you received the following chemotherapy agents Adcetris, Adriamycin, Cytoxan.  To help prevent nausea and vomiting after your treatment, we encourage you to take your nausea medication as directed.  If you develop nausea and vomiting that is not controlled by your nausea medication, call the clinic.   BELOW ARE SYMPTOMS THAT SHOULD BE REPORTED IMMEDIATELY:  *FEVER GREATER THAN 100.5 F  *CHILLS WITH OR WITHOUT FEVER  NAUSEA AND VOMITING THAT IS NOT CONTROLLED WITH YOUR NAUSEA MEDICATION  *UNUSUAL SHORTNESS OF BREATH  *UNUSUAL BRUISING OR BLEEDING  TENDERNESS IN MOUTH AND THROAT WITH OR WITHOUT PRESENCE OF ULCERS  *URINARY PROBLEMS  *BOWEL PROBLEMS  UNUSUAL RASH Items with * indicate a potential emergency and should be followed up as soon as possible.  Feel free to call the clinic should you have any questions or concerns. The clinic phone number is [phone].  Please show the [facility] at check-in to the Emergency Department and triage nurse.

## 2018-07-31 ENCOUNTER — Inpatient Hospital Stay: Payer: Self-pay

## 2018-07-31 ENCOUNTER — Encounter: Payer: Self-pay | Admitting: General Practice

## 2018-07-31 DIAGNOSIS — Z7189 Other specified counseling: Secondary | ICD-10-CM

## 2018-07-31 DIAGNOSIS — C8440 Peripheral T-cell lymphoma, not classified, unspecified site: Secondary | ICD-10-CM

## 2018-07-31 MED ORDER — PEGFILGRASTIM INJECTION 6 MG/0.6ML ~~LOC~~
PREFILLED_SYRINGE | SUBCUTANEOUS | Status: AC
  Filled 2018-07-31: qty 0.6

## 2018-07-31 MED ORDER — PEGFILGRASTIM INJECTION 6 MG/0.6ML ~~LOC~~
6.0000 mg | PREFILLED_SYRINGE | Freq: Once | SUBCUTANEOUS | Status: AC
  Administered 2018-07-31: 6 mg via SUBCUTANEOUS

## 2018-07-31 NOTE — Progress Notes (Signed)
[facility] CSW Progress Notes  Referral received from scheduler, patient reports "severe financial stress."  Spoke w patient regarding current situation.  States she has taken "early retirement."  Got letter from [employer] stating she was denied for SSI, SSDI is "still pending."  Uninsured since leaving job due to cancer diagnosis, could not afford COBRA payments, has been told she will not qualify for Medicaid by DSS.  Severe financial stress has led her to consider discontinuing treatment due to high bills from recent treatments.  Has worked Engineer, drilling to get access to medications through patient assistance.  Advised to contact SSA to track application status, advised to recontact DSS to continue to discuss eligibility for Medicaid.  Patient was referred to [facility] by Altamese Cabal; however, patient declined their help as she had already filed w SSA.  CSW will give patient applications for small grant programs including Duanne [place], CancerCare and Leukemia/Lymphoma Society.   Edwyna Shell, LCSW Clinical Social Worker Phone:  [phone]

## 2018-07-31 NOTE — Patient Instructions (Signed)
Pegfilgrastim injection What is this medicine? PEGFILGRASTIM (PEG fil gra stim) is a long-acting granulocyte colony-stimulating factor that stimulates the growth of neutrophils, a type of white blood cell important in the body's fight against infection. It is used to reduce the incidence of fever and infection in patients with certain types of cancer who are receiving chemotherapy that affects the bone marrow, and to increase survival after being exposed to high doses of radiation. This medicine may be used for other purposes; ask your health care provider or pharmacist if you have questions. COMMON BRAND NAME(S): Neulasta What should I tell my health care provider before I take this medicine? They need to know if you have any of these conditions: -kidney disease -latex allergy -ongoing radiation therapy -sickle cell disease -skin reactions to acrylic adhesives (On-Body Injector only) -an unusual or allergic reaction to pegfilgrastim, filgrastim, other medicines, foods, dyes, or preservatives -pregnant or trying to get pregnant -breast-feeding How should I use this medicine? This medicine is for injection under the skin. If you get this medicine at home, you will be taught how to prepare and give the pre-filled syringe or how to use the On-body Injector. Refer to the patient Instructions for Use for detailed instructions. Use exactly as directed. Tell your healthcare provider immediately if you suspect that the On-body Injector may not have performed as intended or if you suspect the use of the On-body Injector resulted in a missed or partial dose. It is important that you put your used needles and syringes in a special sharps container. Do not put them in a trash can. If you do not have a sharps container, call your pharmacist or healthcare provider to get one. Talk to your pediatrician regarding the use of this medicine in children. While this drug may be prescribed for selected conditions,  precautions do apply. Overdosage: If you think you have taken too much of this medicine contact a poison control center or emergency room at once. NOTE: This medicine is only for you. Do not share this medicine with others. What if I miss a dose? It is important not to miss your dose. Call your doctor or health care professional if you miss your dose. If you miss a dose due to an On-body Injector failure or leakage, a new dose should be administered as soon as possible using a single prefilled syringe for manual use. What may interact with this medicine? Interactions have not been studied. Give your health care provider a list of all the medicines, herbs, non-prescription drugs, or dietary supplements you use. Also tell them if you smoke, drink alcohol, or use illegal drugs. Some items may interact with your medicine. This list may not describe all possible interactions. Give your health care provider a list of all the medicines, herbs, non-prescription drugs, or dietary supplements you use. Also tell them if you smoke, drink alcohol, or use illegal drugs. Some items may interact with your medicine. What should I watch for while using this medicine? You may need blood work done while you are taking this medicine. If you are going to need a MRI, CT scan, or other procedure, tell your doctor that you are using this medicine (On-Body Injector only). What side effects may I notice from receiving this medicine? Side effects that you should report to your doctor or health care professional as soon as possible: -allergic reactions like skin rash, itching or hives, swelling of the face, lips, or tongue -dizziness -fever -pain, redness, or irritation at site   where injected -pinpoint red spots on the skin -red or dark-brown urine -shortness of breath or breathing problems -stomach or side pain, or pain at the shoulder -swelling -tiredness -trouble passing urine or change in the amount of urine Side  effects that usually do not require medical attention (report to your doctor or health care professional if they continue or are bothersome): -bone pain -muscle pain This list may not describe all possible side effects. Call your doctor for medical advice about side effects. You may report side effects to FDA at 1-800-FDA-1088. Where should I keep my medicine? Keep out of the reach of children. Store pre-filled syringes in a refrigerator between 2 and 8 degrees C (36 and 46 degrees F). Do not freeze. Keep in carton to protect from light. Throw away this medicine if it is left out of the refrigerator for more than 48 hours. Throw away any unused medicine after the expiration date. NOTE: This sheet is a summary. It may not cover all possible information. If you have questions about this medicine, talk to your doctor, pharmacist, or health care provider.  2018 Elsevier/Gold Standard (2016-11-01 12:58:03)  

## 2018-08-01 ENCOUNTER — Other Ambulatory Visit: Payer: Self-pay

## 2018-08-01 DIAGNOSIS — C8440 Peripheral T-cell lymphoma, not classified, unspecified site: Secondary | ICD-10-CM

## 2018-08-01 DIAGNOSIS — Z7189 Other specified counseling: Secondary | ICD-10-CM

## 2018-08-01 NOTE — Progress Notes (Signed)
Referrals

## 2018-08-11 ENCOUNTER — Other Ambulatory Visit: Payer: Self-pay

## 2018-08-11 ENCOUNTER — Inpatient Hospital Stay: Payer: Self-pay

## 2018-08-11 ENCOUNTER — Inpatient Hospital Stay (HOSPITAL_BASED_OUTPATIENT_CLINIC_OR_DEPARTMENT_OTHER): Payer: Self-pay | Admitting: Hematology

## 2018-08-11 ENCOUNTER — Encounter: Payer: Self-pay | Admitting: Hematology

## 2018-08-11 VITALS — BP 94/37 | HR 87 | Temp 98.5°F | Resp 18 | Ht 61.81 in | Wt 106.1 lb

## 2018-08-11 DIAGNOSIS — C865 Angioimmunoblastic T-cell lymphoma: Secondary | ICD-10-CM

## 2018-08-11 DIAGNOSIS — D649 Anemia, unspecified: Secondary | ICD-10-CM

## 2018-08-11 DIAGNOSIS — I10 Essential (primary) hypertension: Secondary | ICD-10-CM

## 2018-08-11 DIAGNOSIS — F17210 Nicotine dependence, cigarettes, uncomplicated: Secondary | ICD-10-CM

## 2018-08-11 DIAGNOSIS — E876 Hypokalemia: Secondary | ICD-10-CM

## 2018-08-11 DIAGNOSIS — C8440 Peripheral T-cell lymphoma, not classified, unspecified site: Secondary | ICD-10-CM

## 2018-08-11 DIAGNOSIS — I82621 Acute embolism and thrombosis of deep veins of right upper extremity: Secondary | ICD-10-CM

## 2018-08-11 DIAGNOSIS — Z7901 Long term (current) use of anticoagulants: Secondary | ICD-10-CM

## 2018-08-11 DIAGNOSIS — N809 Endometriosis, unspecified: Secondary | ICD-10-CM

## 2018-08-11 LAB — CBC WITH DIFFERENTIAL/PLATELET
BASOS PCT: 0 %
Basophils Absolute: 0 10*3/uL (ref 0.0–0.1)
Eosinophils Absolute: 0 10*3/uL (ref 0.0–0.5)
Eosinophils Relative: 0 %
HEMATOCRIT: 18.5 % — AB (ref 34.8–46.6)
HEMOGLOBIN: 6.5 g/dL — AB (ref 11.6–15.9)
LYMPHS ABS: 0.5 10*3/uL — AB (ref 0.9–3.3)
LYMPHS PCT: 16 %
MCH: 29.1 pg (ref 25.1–34.0)
MCHC: 35.1 g/dL (ref 31.5–36.0)
MCV: 83 fL (ref 79.5–101.0)
MONO ABS: 0.6 10*3/uL (ref 0.1–0.9)
MONOS PCT: 19 %
NEUTROS ABS: 2.1 10*3/uL (ref 1.5–6.5)
NEUTROS PCT: 65 %
Platelets: 72 10*3/uL — ABNORMAL LOW (ref 145–400)
RBC: 2.23 MIL/uL — ABNORMAL LOW (ref 3.70–5.45)
RDW: 14.2 % (ref 11.2–14.5)
WBC: 3.3 10*3/uL — ABNORMAL LOW (ref 3.9–10.3)

## 2018-08-11 LAB — CMP (CANCER CENTER ONLY)
ALK PHOS: 90 U/L (ref 38–126)
ALT: 26 U/L (ref 0–44)
ANION GAP: 8 (ref 5–15)
AST: 21 U/L (ref 15–41)
Albumin: 3.4 g/dL — ABNORMAL LOW (ref 3.5–5.0)
BUN: 9 mg/dL (ref 8–23)
CHLORIDE: 100 mmol/L (ref 98–111)
CO2: 28 mmol/L (ref 22–32)
Calcium: 9.5 mg/dL (ref 8.9–10.3)
Creatinine: 0.68 mg/dL (ref 0.44–1.00)
GFR, Estimated: >60 mL/min (ref >60)
GLUCOSE: 174 mg/dL — AB (ref 70–99)
POTASSIUM: 4.1 mmol/L (ref 3.5–5.1)
SODIUM: 136 mmol/L (ref 135–145)
Total Bilirubin: 0.7 mg/dL (ref 0.3–1.2)
Total Protein: 6.5 g/dL (ref 6.5–8.1)

## 2018-08-11 NOTE — Progress Notes (Signed)
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 female who has been referred to [place] by Dr Joni Reining for evaluation and management of her newly diagnosed angioimmunoblastic T cell lymphoma.   Patient has a h/o HTN, endometriosis, tobacco use who was admitted to the hospital with shortness of breath and was subsequently found to have diffuse lymphadenopathy. She was also found to have bilateral pleural effusions and has had a thoracentesis with pleural fluid demonstrating atypical lymphocytosis. She was discharged but returned to the ED with complaints of persisting SOB, abdominal swelling, and lower extremity swelling.   She has had a  CTA chest and CT abd/pelvis which showed no PE.but extensive LNadenopathy in the chest and throughout the abd with b/l pleural effusion and mild ascites. Also noted to have narrowing in b/l common iliac arteries.  Clinically she is noted to have b/l LE edema 1-2+  Most recent lab results (05/05/18) of CBC  is as follows: all values are WNL except for RBC at 2.59, HGB at 7.7, HCT at 22.7, RDW at 16.0, PLT at 89k.  She has had a CT bone marrow biopsy on 05/05/2018 -results pending. ECHO was done and showed  Systolic function was   normal. The estimated ejection fraction was in the range of 60%   to 65%. Wall motion was normal; there were no regional wall   motion abnormalities. Left ventricular diastolic function   parameters were normal.  Port a cath placement requested.  Patient was transferred to [facility] with her consent to receive C1 of chemotherapy as inpatient.  Interval History:   Deborah Cooley returns today for management and  evaluation of her Angioimmunoblastic T cell lymphoma and C5 of her treatment. The patient's last visit with [place] was on 07/30/18. She is accompanied today by her husband. The pt reports that she is doing well overall.   The pt reports that she has felt very tired this week. She denies any concerns for infections. She notes that she was eating well the last two weeks except for today. She notes that she gets some mild numbness in one finger tips occasionally.   She notes that she has not been approved for Medicaid yet and is working on this with social work so that she can be seen at [facility].   Lab results today (08/11/18) of CBC w/diff, CMP is as follows: all values are WNL except for WBC at 3.3k, RBC at 2.23, HGB at 6.5, HCT at 18.5, PLT at 72k, Lymphs abs at 500, Glucose at 174, Albumin at 3.4.  On review of systems, pt reports feeling tired, and denies concerns for infections, abdominal pains, lower abdominal pains, leg swelling, and any other symptoms.    MEDICAL HISTORY:  Past Medical History:  Diagnosis Date  . Bilateral pleural effusion 04/17/2018  . Endometriosis   . Essential hypertension, benign   . Family history of adverse reaction to anesthesia    "sister stops breathing I think" (04/18/2018)  . Heart murmur   . Mixed hyperlipidemia   . Pneumonia 04/17/2018    SURGICAL HISTORY: Past Surgical History:  Procedure Laterality Date  . ABDOMINAL HYSTERECTOMY  1970s/1980s   for endometriosis  . Arthroscopic  right shoulder surgery    . AXILLARY LYMPH NODE BIOPSY Left 04/21/2018   Procedure: LEFT AXILLARY LYMPH NODE BIOPSY;  Surgeon: Kieth Brightly, Arta Bruce, MD;  Location: [facility];  Service: General;  Laterality: Left;  . CARPAL TUNNEL RELEASE Right   . GANGLION CYST EXCISION Right   . IR IMAGING GUIDED PORT INSERTION  05/07/2018  . MULTIPLE TOOTH EXTRACTIONS    . OOPHORECTOMY  1997  . PARTIAL HYSTERECTOMY  1984  . Right hand surgery    . SHOULDER OPEN ROTATOR CUFF REPAIR Right     . VESICOVAGINAL FISTULA CLOSURE W/ TAH      SOCIAL HISTORY: Social History   Socioeconomic History  . Marital status: Married    Spouse name: Not on file  . Number of children: Not on file  . Years of education: Not on file  . Highest education level: Not on file  Occupational History  . Occupation: Social research officer, government    Comment: works at [employer]  . Financial resource strain: Not on file  . Food insecurity:    Worry: Not on file    Inability: Not on file  . Transportation needs:    Medical: Not on file    Non-medical: Not on file  Tobacco Use  . Smoking status: Current Every Day Smoker    Packs/day: 1.50    Years: 46.00    Pack years: 69.00    Types: Cigarettes  . Smokeless tobacco: Never Used  Substance and Sexual Activity  . Alcohol use: Not Currently    Comment: 04/18/2018 "quit years ago"  . Drug use: Not Currently    Comment: "back in my 13s"  . Sexual activity: Not Currently  Lifestyle  . Physical activity:    Days per week: Not on file    Minutes per session: Not on file  . Stress: Not on file  Relationships  . Social connections:    Talks on phone: Not on file    Gets together: Not on file    Attends religious service: Not on file    Active member of club or organization: Not on file    Attends meetings of clubs or organizations: Not on file    Relationship status: Not on file  . Intimate partner violence:    Fear of current or ex partner: Not on file    Emotionally abused: Not on file    Physically abused: Not on file    Forced sexual activity: Not on file  Other Topics Concern  . Not on file  Social History Narrative   ** Merged History Encounter **       Patient has one son whom is healthy.   Has a live in boyfriend.    FAMILY HISTORY: Family History  Problem Relation Age of Onset  . Diabetes Mother        age 54  . Heart attack Father        died age 91's  . Hypertension Father   . Cancer Sister        breast  cancer diagonsed age 30 years  . Diabetes Unknown   . Heart disease Unknown        Female <55  . Arthritis Unknown   . Asthma Unknown   . Hyperlipidemia Other        several siblings  . Thyroid disease Sister        one sister with thyroid disease    ALLERGIES:  is allergic  to [employer] [zolpidem].  MEDICATIONS:  Current Outpatient Medications  Medication Sig Dispense Refill  . acetaminophen (TYLENOL) 325 MG tablet Take 2 tablets (650 mg total) by mouth every 6 (six) hours as needed for mild pain (or Fever >/= 101).    Marland Kitchen apixaban (ELIQUIS) 2.5 MG TABS tablet Take 1 tablet (2.5 mg total) by mouth 2 (two) times daily. 60 tablet 3  . lidocaine-prilocaine (EMLA) cream Apply a dime size to port-a-cath 1-2 hours prior to access. Cover with saran wrap. 30 g 2  . Multiple Vitamin (MULTIVITAMIN WITH MINERALS) TABS tablet Take 1 tablet by mouth daily. 30 tablet 0  . polyethylene glycol (MIRALAX / GLYCOLAX) packet Take 17 g by mouth daily as needed for mild constipation. 30 each 0  . potassium chloride SA (K-DUR,KLOR-CON) 20 MEQ tablet 40 meq orally twice daily for 2 daily then 43mq PO daily 60 tablet 0  . predniSONE (DELTASONE) 20 MG tablet Take 3 tablets (60 mg total) by mouth daily. Take on days 1-5 of chemotherapy. 15 tablet 0  . LORazepam (ATIVAN) 0.5 MG tablet Take 1 tablet (0.5 mg total) by mouth every 8 (eight) hours as needed for anxiety. (Patient not taking: Reported on 08/11/2018) 30 tablet 0  . ondansetron (ZOFRAN) 8 MG tablet Take 1 tablet (8 mg total) by mouth every 8 (eight) hours as needed for nausea. Start on day 3 after chemotherapy (Patient not taking: Reported on 08/11/2018) 30 tablet 3  . prochlorperazine (COMPAZINE) 10 MG tablet Take 1 tablet (10 mg total) by mouth every 6 (six) hours as needed for nausea or vomiting. (Patient not taking: Reported on 08/11/2018) 30 tablet 1   No current facility-administered medications for this visit.     REVIEW OF SYSTEMS:    A 10+ POINT REVIEW  OF SYSTEMS WAS OBTAINED including neurology, dermatology, psychiatry, cardiac, respiratory, lymph, extremities, GI, GU, Musculoskeletal, constitutional, breasts, reproductive, HEENT.  All pertinent positives are noted in the HPI.  All others are negative.   PHYSICAL EXAMINATION: ECOG PERFORMANCE STATUS: 2 - Symptomatic, <50% confined to bed  VS reviewed in Epic  GENERAL:alert, in no acute distress and comfortable SKIN: no acute rashes, no significant lesions EYES: conjunctival pallor (+), sclera anicteric OROPHARYNX: MMM, no exudates, no oropharyngeal erythema or ulceration NECK: supple, no JVD LYMPH:  no palpable lymphadenopathy in the cervical, axillary or inguinal regions LUNGS: decreased breath sounds 1/5 up right  HEART: regular rate & rhythm ABDOMEN:  normoactive bowel sounds , non tender, not distended. No palpable hepatosplenomegaly.  Extremity: no pedal edema PSYCH: alert & oriented x 3 with fluent speech NEURO: no focal motor/sensory deficits   LABORATORY DATA:  I have reviewed the data as listed  . CBC Latest Ref Rng & Units 08/11/2018 07/30/2018 07/22/2018  WBC 3.9 - 10.3 K/uL 3.3(L) 4.6 3.6(L)  Hemoglobin 11.6 - 15.9 g/dL 6.5(LL) 11.1(L) 5.1(LL)  Hematocrit 34.8 - 46.6 % 18.5(L) 33.3(L) 14.4(L)  Platelets 145 - 400 K/uL 72(L) 129(L) 110(L)  ANC 2400  CBC    Component Value Date/Time   WBC 3.3 (L) 08/11/2018 1413   RBC 2.23 (L) 08/11/2018 1413   HGB 6.5 (LL) 08/11/2018 1413   HGB 9.3 (L) 06/11/2018 0859   HCT 18.5 (L) 08/11/2018 1413   PLT 72 (L) 08/11/2018 1413   PLT 247 06/11/2018 0859   MCV 83.0 08/11/2018 1413   MCH 29.1 08/11/2018 1413   MCHC 35.1 08/11/2018 1413   RDW 14.2 08/11/2018 1413   LYMPHSABS 0.5 (L) 08/11/2018 1413  MONOABS 0.6 08/11/2018 1413   EOSABS 0.0 08/11/2018 1413   BASOSABS 0.0 08/11/2018 1413    . CMP Latest Ref Rng & Units 08/11/2018 07/30/2018 07/22/2018  Glucose 70 - 99 mg/dL 174(H) 123(H) 195(H)  BUN 8 - 23 mg/dL '9 17 10    ' Creatinine 0.44 - 1.00 mg/dL 0.68 0.70 0.67  Sodium 135 - 145 mmol/L 136 138 138  Potassium 3.5 - 5.1 mmol/L 4.1 4.4 3.3(L)  Chloride 98 - 111 mmol/L 100 103 101  CO2 22 - 32 mmol/L '28 26 26  ' Calcium 8.9 - 10.3 mg/dL 9.5 9.7 9.5  Total Protein 6.5 - 8.1 g/dL 6.5 7.1 6.5  Total Bilirubin 0.3 - 1.2 mg/dL 0.7 0.7 0.6  Alkaline Phos 38 - 126 U/L 90 80 82  AST 15 - 41 U/L '21 21 18  ' ALT 0 - 44 U/L '26 20 24    ' . Lab Results  Component Value Date   LDH 223 (H) 07/30/2018      Component     Latest Ref Rng & Units 04/18/2018 05/03/2018  Hepatitis B Surface Ag     Negative  Negative  HCV Ab     0.0 - 0.9 s/co ratio  <0.1  Hep A Ab, IgM     Negative  Negative  Hep B Core Ab, IgM     Negative  Negative  HIV Screen 4th Generation wRfx     Non Reactive Non Reactive     04/21/18 Molecular Pathology:   04/21/18 Surgical Pathology:    RADIOGRAPHIC STUDIES: I have personally reviewed the radiological images as listed and agreed with the findings in the report. No results found.  ASSESSMENT & PLAN:   62 y.o. female with  1. 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 S/P C1 OF CHOP on 05/08/2018 S/p C2 of Brentuximab-CHP on 05/28/2018  S/p C3 of Brentuximab-CHP on 06/18/2018 S/p C4 of Brentuximab-CHP on  07/09/2018 S/p C5 of Brentuximab-CHP on  07/30/2018   07/01/18 PET/CT revealed Resolution of axillary, mediastinal, periaortic, and inguinal lymphadenopathy. ( [place] 1) 2. Decrease in size of spleen.  Normal metabolic activity. 3. Uniform increase in marrow metabolic activity is most consistent with GCSF type response.   2. General LNadenopathy from lymphoma with b/l pleural effusion and b/l lower extremity weakness- resolved  3. Anemia/thrombocytopenia - from Bone marrow involvement by lymphoma + Chemotherapy BM Bx confirms involved by T cell lymphoma. hgb better after PRBC transfusion.  4. Coombs +ve  for Warm auto antibody. LDH wnl no evidnece of overt hemolysis at this timne   5. CMV IgM+ --  Component     Latest Ref Rng & Units 05/09/2018  CMV Quant DNA PCR (Plasma)     Negative IU/mL 8,430  Log10 CMV Qn DNA Pl     log10 IU/mL 3.926    7. Port a cath in situ  8. 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 S/p Acute hypoxic respiratory failure - required intubation --now extubated and with minimal oxygen needs.  ?etiology Re-expansion pulmonary edema post large volume thoracentesis vs aspiration Though the rapidity of presentation is unusual would need to consider HCAP and CMV pneumonitis (CMV IgM+ and PCV positive with worsening). Resolved hypoxia - treated with antibiotics for aspiration pneumonia.  10 . RUE DVT - US 6/25 -- was briefly on IV Heparin but held given thrombocytopenia --  currently on Eliquis  11.. Hypokalemia K 3.3  PLAN: -Recommended that the pt increase her Boost/Ensure consumption and do her best to keep eating -The pt has reached complete metabolic response after 3 cycles of treatment  -Discussed the option to pursue BM transplant after completing treatment and that I would be happy to refer the pt to [facility] for a discussion with a transplant team - this was done but not possible due to lack of insurance. -Again stressed the importance of the pt establishing care with a PCP. -Discussed pt labwork today, 08/11/18; HGB at 6.5, PLT at 72k -Will order 2 units PRBCs -Will provide Social work phone number and contact info again for [employer]  -The pt was able to have Eliquis refilled, and I discussed my recommendation to continue Eliquis until there is no remaining sign of lymphoma and after 6 months of blood thinners -Recommended that the pt continue to eat well, drink at least 48-64 oz of water each day, and walk 20-30 minutes each day.  -Neulasta support  tomorrow     PRBC transfusion 7:30 AM tomorrow -2 units RTC as per appointment on 08/20/2018 with labs/MD/chemotherapy Cancel chemotherapy/neulasta appointments for 10/23 and 10/24    All of the patients questions were answered with apparent satisfaction. The patient knows to call the clinic with any problems, questions or concerns.  The total time spent in the appt was 30 minutes and more than 50% was on counseling and direct patient cares.    Sullivan Lone MD MS AAHIVMS [facility] Hematology/Oncology Physician [facility]  (Office):       [phone] (Work cell):  [phone] (Fax):           [phone]  I, Baldwin [place], am acting as a scribe for Dr. Irene Limbo  .I have reviewed the above documentation for accuracy and completeness, and I agree with the above. Brunetta Genera MD

## 2018-08-12 ENCOUNTER — Inpatient Hospital Stay: Payer: Self-pay

## 2018-08-12 VITALS — BP 111/53 | HR 76 | Temp 98.1°F | Resp 16

## 2018-08-12 DIAGNOSIS — D649 Anemia, unspecified: Secondary | ICD-10-CM

## 2018-08-12 MED ORDER — DIPHENHYDRAMINE HCL 25 MG PO CAPS
ORAL_CAPSULE | ORAL | Status: AC
  Filled 2018-08-12: qty 1

## 2018-08-12 MED ORDER — SODIUM CHLORIDE 0.9% FLUSH
10.0000 mL | INTRAVENOUS | Status: AC | PRN
  Administered 2018-08-12: 10 mL
  Filled 2018-08-12: qty 10

## 2018-08-12 MED ORDER — SODIUM CHLORIDE 0.9% IV SOLUTION
250.0000 mL | Freq: Once | INTRAVENOUS | Status: AC
  Administered 2018-08-12: 250 mL via INTRAVENOUS
  Filled 2018-08-12: qty 250

## 2018-08-12 MED ORDER — ACETAMINOPHEN 325 MG PO TABS
ORAL_TABLET | ORAL | Status: AC
  Filled 2018-08-12: qty 2

## 2018-08-12 MED ORDER — DIPHENHYDRAMINE HCL 25 MG PO CAPS
25.0000 mg | ORAL_CAPSULE | Freq: Once | ORAL | Status: AC
  Administered 2018-08-12: 25 mg via ORAL

## 2018-08-12 MED ORDER — ACETAMINOPHEN 325 MG PO TABS
650.0000 mg | ORAL_TABLET | Freq: Once | ORAL | Status: AC
  Administered 2018-08-12: 650 mg via ORAL

## 2018-08-12 MED ORDER — HEPARIN SOD (PORK) LOCK FLUSH 100 UNIT/ML IV SOLN
500.0000 [IU] | Freq: Once | INTRAVENOUS | Status: AC
  Administered 2018-08-12: 500 [IU] via INTRAVENOUS
  Filled 2018-08-12: qty 5

## 2018-08-12 NOTE — Patient Instructions (Signed)

## 2018-08-13 ENCOUNTER — Telehealth: Payer: Self-pay | Admitting: General Practice

## 2018-08-13 LAB — BPAM RBC
BLOOD PRODUCT EXPIRATION DATE: 201910172359
Blood Product Expiration Date: 201910192359
ISSUE DATE / TIME: 201909240937
ISSUE DATE / TIME: 201909241206
Unit Type and Rh: 5100
Unit Type and Rh: 5100

## 2018-08-13 LAB — TYPE AND SCREEN
ABO/RH(D): O POS
ANTIBODY SCREEN: NEGATIVE
Unit division: 0
Unit division: 0

## 2018-08-13 NOTE — Telephone Encounter (Signed)
[place] CSW Progress Note  Called patient to follow up on previous encounter re financial stress.  Patient has been denied for SSI disability but is still waiting on decision on SSDI.  Has not heard from [place] re Medicaid eligibility.  Has not completed applications for small grants mailed by [place] last month.  Is waiting for friend to be able to help her w these.  Encouraged patient to call DSS, talk w worker handling her case, request status update and keep Financial Advocate Stefanie Libel informed.  Encouraged patient to let CSW know when she is finished w her part of small grant applications so these can be completed w [place] information/signatures.    Deborah Shell, LCSW Clinical Social Worker Phone:  [phone]

## 2018-08-14 ENCOUNTER — Encounter: Payer: Self-pay | Admitting: General Practice

## 2018-08-14 NOTE — Progress Notes (Signed)
[place] CSW Progress Notes  CSW spoke w [place] (assigned worker is Concepcion Living [phone]).  Worker has been in contact w patient to discuss need for additional documentation of assets in the home - when this documentation is received and if patient is determined disabled by [employer], patient may be eligible for help w Medicaid.  Patient must supply documentation of assets as requested by [facility] worker. Worker also requests copies of medical bills from June 6295 forward (application date for Medicaid was 78/19/2019) in order to determine patient's deductible.  CSW discussed above w patient who says that she will provide requested information to DSS worker.  Edwyna Shell, LCSW Clinical Social Worker Phone:  [phone]

## 2018-08-19 ENCOUNTER — Other Ambulatory Visit: Payer: Self-pay

## 2018-08-19 DIAGNOSIS — C8440 Peripheral T-cell lymphoma, not classified, unspecified site: Secondary | ICD-10-CM

## 2018-08-19 NOTE — Progress Notes (Signed)
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 female who has been referred to [place] by Dr Joni Reining for evaluation and management of her newly diagnosed angioimmunoblastic T cell lymphoma.   Patient has a h/o HTN, endometriosis, tobacco use who was admitted to the hospital with shortness of breath and was subsequently found to have diffuse lymphadenopathy. She was also found to have bilateral pleural effusions and has had a thoracentesis with pleural fluid demonstrating atypical lymphocytosis. She was discharged but returned to the ED with complaints of persisting SOB, abdominal swelling, and lower extremity swelling.   She has had a  CTA chest and CT abd/pelvis which showed no PE.but extensive LNadenopathy in the chest and throughout the abd with b/l pleural effusion and mild ascites. Also noted to have narrowing in b/l common iliac arteries.  Clinically she is noted to have b/l LE edema 1-2+  Most recent lab results (05/05/18) of CBC  is as follows: all values are WNL except for RBC at 2.59, HGB at 7.7, HCT at 22.7, RDW at 16.0, PLT at 89k.  She has had a CT bone marrow biopsy on 05/05/2018 -results pending. ECHO was done and showed  Systolic function was   normal. The estimated ejection fraction was in the range of 60%   to 65%. Wall motion was normal; there were no regional wall   motion abnormalities. Left ventricular diastolic function   parameters were normal.  Port a cath placement requested.  Patient was transferred to [facility] with her consent to receive C1 of chemotherapy as inpatient.  Interval History:   Deborah Cooley returns today for management and  evaluation of her Angioimmunoblastic T cell lymphoma and C6 of her treatment. The patient's last visit with [place] was on 08/11/18. She is accompanied today by her husband. The pt reports that she is doing well overall.   The pt notes that her energy levels are much improved after her blood transfusion. The pt reports that she had a fever of 101 with some chills for one single night several days ago. She notes that she feels as though she is at her baseline currently and denies current fevers, chills, or concerns for infections. She notes that she is consuming Boost and Ensure and is staying hydrated.   Lab results today (08/20/18) of CBC w/diff, CMP is as follows: all values are WNL except for RBC at 2.87, HGB at 8.5, HCT at 24.1, Lymphs abs at 600, Monocytes abs at 1.3k, Glucose at 133, BUN at 6, Albumin at 3.3. 08/20/18 LDH is at 209  On review of systems, pt reports staying hydrated, improved energy levels, and denies fevers, chills, night sweats, concerns for infections, and any other symptoms.   MEDICAL HISTORY:  Past Medical History:  Diagnosis Date  . Bilateral pleural effusion 04/17/2018  . Endometriosis   . Essential hypertension, benign   . Family history of adverse reaction to anesthesia    "sister stops breathing I think" (04/18/2018)  . Heart murmur   . Mixed hyperlipidemia   . Pneumonia 04/17/2018    SURGICAL HISTORY: Past Surgical History:  Procedure Laterality Date  . ABDOMINAL HYSTERECTOMY  1970s/1980s   for endometriosis  . Arthroscopic  right shoulder surgery    . AXILLARY LYMPH NODE BIOPSY Left 04/21/2018   Procedure: LEFT AXILLARY LYMPH NODE BIOPSY;  Surgeon: Kieth Brightly, Arta Bruce, MD;  Location: [place];  Service: General;  Laterality: Left;  . CARPAL TUNNEL RELEASE Right   . GANGLION CYST EXCISION Right   . IR IMAGING GUIDED PORT INSERTION  05/07/2018  . MULTIPLE TOOTH EXTRACTIONS    . OOPHORECTOMY  1997  . PARTIAL HYSTERECTOMY  1984  . Right hand surgery    . SHOULDER  OPEN ROTATOR CUFF REPAIR Right   . VESICOVAGINAL FISTULA CLOSURE W/ TAH      SOCIAL HISTORY: Social History   Socioeconomic History  . Marital status: Married    Spouse name: Not on file  . Number of children: Not on file  . Years of education: Not on file  . Highest education level: Not on file  Occupational History  . Occupation: Social research officer, government    Comment: works at [employer]  . Financial resource strain: Not on file  . Food insecurity:    Worry: Not on file    Inability: Not on file  . Transportation needs:    Medical: Not on file    Non-medical: Not on file  Tobacco Use  . Smoking status: Current Every Day Smoker    Packs/day: 1.50    Years: 46.00    Pack years: 69.00    Types: Cigarettes  . Smokeless tobacco: Never Used  Substance and Sexual Activity  . Alcohol use: Not Currently    Comment: 04/18/2018 "quit years ago"  . Drug use: Not Currently    Comment: "back in my 50s"  . Sexual activity: Not Currently  Lifestyle  . Physical activity:    Days per week: Not on file    Minutes per session: Not on file  . Stress: Not on file  Relationships  . Social connections:    Talks on phone: Not on file    Gets together: Not on file    Attends religious service: Not on file    Active member of club or organization: Not on file    Attends meetings of clubs or organizations: Not on file    Relationship status: Not on file  . Intimate partner violence:    Fear of current or ex partner: Not on file    Emotionally abused: Not on file    Physically abused: Not on file    Forced sexual activity: Not on file  Other Topics Concern  . Not on file  Social History Narrative   ** Merged History Encounter **       Patient has one son whom is healthy.   Has a live in boyfriend.    FAMILY HISTORY: Family History  Problem Relation Age of Onset  . Diabetes Mother        age 50  . Heart attack Father        died age 37's  . Hypertension Father     . Cancer Sister        breast cancer diagonsed age 56 years  . Diabetes Unknown   . Heart disease Unknown        Female <55  . Arthritis Unknown   . Asthma Unknown   . Hyperlipidemia Other        several siblings  . Thyroid disease Sister        one sister with thyroid disease    ALLERGIES:  is allergic  to [employer] [zolpidem].  MEDICATIONS:  Current Outpatient Medications  Medication Sig Dispense Refill  . acetaminophen (TYLENOL) 325 MG tablet Take 2 tablets (650 mg total) by mouth every 6 (six) hours as needed for mild pain (or Fever >/= 101).    Marland Kitchen apixaban (ELIQUIS) 2.5 MG TABS tablet Take 1 tablet (2.5 mg total) by mouth 2 (two) times daily. 60 tablet 3  . lidocaine-prilocaine (EMLA) cream Apply a dime size to port-a-cath 1-2 hours prior to access. Cover with saran wrap. 30 g 2  . LORazepam (ATIVAN) 0.5 MG tablet Take 1 tablet (0.5 mg total) by mouth every 8 (eight) hours as needed for anxiety. (Patient not taking: Reported on 08/11/2018) 30 tablet 0  . Multiple Vitamin (MULTIVITAMIN WITH MINERALS) TABS tablet Take 1 tablet by mouth daily. 30 tablet 0  . ondansetron (ZOFRAN) 8 MG tablet Take 1 tablet (8 mg total) by mouth every 8 (eight) hours as needed for nausea. Start on day 3 after chemotherapy (Patient not taking: Reported on 08/11/2018) 30 tablet 3  . polyethylene glycol (MIRALAX / GLYCOLAX) packet Take 17 g by mouth daily as needed for mild constipation. 30 each 0  . potassium chloride SA (K-DUR,KLOR-CON) 20 MEQ tablet 40 meq orally twice daily for 2 daily then 58mq PO daily 60 tablet 0  . predniSONE (DELTASONE) 20 MG tablet Take 3 tablets (60 mg total) by mouth daily. Take on days 1-5 of chemotherapy. 15 tablet 0  . prochlorperazine (COMPAZINE) 10 MG tablet Take 1 tablet (10 mg total) by mouth every 6 (six) hours as needed for nausea or vomiting. (Patient not taking: Reported on 08/11/2018) 30 tablet 1   No current facility-administered medications for this visit.     REVIEW OF  SYSTEMS:    A 10+ POINT REVIEW OF SYSTEMS WAS OBTAINED including neurology, dermatology, psychiatry, cardiac, respiratory, lymph, extremities, GI, GU, Musculoskeletal, constitutional, breasts, reproductive, HEENT.  All pertinent positives are noted in the HPI.  All others are negative.   PHYSICAL EXAMINATION: ECOG PERFORMANCE STATUS: 2 - Symptomatic, <50% confined to bed  VS reviewed in Epic  GENERAL:alert, in no acute distress and comfortable SKIN: no acute rashes, no significant lesions EYES: conjunctiva pallor (+), sclera anicteric OROPHARYNX: MMM, no exudates, no oropharyngeal erythema or ulceration NECK: supple, no JVD LYMPH:  no palpable lymphadenopathy in the cervical, axillary or inguinal regions LUNGS: decreased breath sounds 1/5 up right  HEART: regular rate & rhythm ABDOMEN:  normoactive bowel sounds , non tender, not distended. No palpable hepatosplenomegaly.  Extremity: no pedal edema PSYCH: alert & oriented x 3 with fluent speech NEURO: no focal motor/sensory deficits   LABORATORY DATA:  I have reviewed the data as listed  . CBC Latest Ref Rng & Units 08/20/2018 08/11/2018 07/30/2018  WBC 3.9 - 10.3 K/uL 5.8 3.3(L) 4.6  Hemoglobin 11.6 - 15.9 g/dL 8.5(L) 6.5(LL) 11.1(L)  Hematocrit 34.8 - 46.6 % 24.1(L) 18.5(L) 33.3(L)  Platelets 145 - 400 K/uL 195 72(L) 129(L)  ANC 2400  CBC    Component Value Date/Time   WBC 5.8 08/20/2018 0741   WBC 3.3 (L) 08/11/2018 1413   RBC 2.87 (L) 08/20/2018 0741   HGB 8.5 (L) 08/20/2018 0741   HCT 24.1 (L) 08/20/2018 0741   PLT 195 08/20/2018 0741   MCV 84.0 08/20/2018 0741   MCH 29.6 08/20/2018 0741   MCHC 35.2 08/20/2018 0741   RDW 14.4 08/20/2018 0741   LYMPHSABS 0.6 (L) 08/20/2018 0741   MONOABS 1.3 (H) 08/20/2018 00923  EOSABS 0.0 08/20/2018 0741   BASOSABS 0.0 08/20/2018 0741    . CMP Latest Ref Rng & Units 08/20/2018 08/11/2018 07/30/2018  Glucose 70 - 99 mg/dL 133(H) 174(H) 123(H)  BUN 8 - 23 mg/dL 6(L) 9 17    Creatinine 0.44 - 1.00 mg/dL 0.66 0.68 0.70  Sodium 135 - 145 mmol/L 139 136 138  Potassium 3.5 - 5.1 mmol/L 4.5 4.1 4.4  Chloride 98 - 111 mmol/L 101 100 103  CO2 22 - 32 mmol/L '27 28 26  ' Calcium 8.9 - 10.3 mg/dL 9.8 9.5 9.7  Total Protein 6.5 - 8.1 g/dL 7.0 6.5 7.1  Total Bilirubin 0.3 - 1.2 mg/dL 0.5 0.7 0.7  Alkaline Phos 38 - 126 U/L 81 90 80  AST 15 - 41 U/L '23 21 21  ' ALT 0 - 44 U/L '21 26 20    ' . Lab Results  Component Value Date   LDH 209 (H) 08/20/2018      Component     Latest Ref Rng & Units 04/18/2018 05/03/2018  Hepatitis B Surface Ag     Negative  Negative  HCV Ab     0.0 - 0.9 s/co ratio  <0.1  Hep A Ab, IgM     Negative  Negative  Hep B Core Ab, IgM     Negative  Negative  HIV Screen 4th Generation wRfx     Non Reactive Non Reactive     04/21/18 Molecular Pathology:   04/21/18 Surgical Pathology:    RADIOGRAPHIC STUDIES: I have personally reviewed the radiological images as listed and agreed with the findings in the report. No results found.  ASSESSMENT & PLAN:   62 y.o. female with  1. 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 S/P C1 OF CHOP on 05/08/2018 S/p C2 of Brentuximab-CHP on 05/28/2018  S/p C3 of Brentuximab-CHP on 06/18/2018 S/p C4 of Brentuximab-CHP on  07/09/2018 S/p C5 of Brentuximab-CHP on  07/30/2018   07/01/18 PET/CT revealed Resolution of axillary, mediastinal, periaortic, and inguinal lymphadenopathy. ( [place] 1) 2. Decrease in size of spleen.  Normal metabolic activity. 3. Uniform increase in marrow metabolic activity is most consistent with GCSF type response.   Discussed the option to pursue BM transplant after completing treatment and that I would be happy to refer the pt to [facility] for a discussion with a transplant team - this was done but not possible due to lack of insurance.  2. General LNadenopathy from lymphoma with b/l pleural effusion and  b/l lower extremity weakness- resolved  3. Anemia/thrombocytopenia - from Bone marrow involvement by lymphoma + Chemotherapy BM Bx confirms involved by T cell lymphoma. hgb better after PRBC transfusion.  4. 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 LDH wnl no evidnece of overt hemolysis at this timne   5. CMV IgM+ --  Component     Latest Ref Rng & Units 05/09/2018  CMV Quant DNA PCR (Plasma)     Negative IU/mL 8,430  Log10 CMV Qn DNA Pl     log10 IU/mL 3.926    7. Port a cath in situ  8. 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 S/p Acute hypoxic respiratory failure - required intubation --now extubated and with minimal oxygen needs.  ?etiology Re-expansion pulmonary edema post large volume thoracentesis vs aspiration Though the rapidity of presentation is unusual would need to consider HCAP and  CMV pneumonitis (CMV IgM+ and PCV positive with worsening). Resolved hypoxia - treated with antibiotics for aspiration pneumonia.  10 . RUE DVT - US 6/25 -- was briefly on IV Heparin but held given thrombocytopenia -- currently on Eliquis  PLAN: -Discussed pt labwork today, 08/20/18; HGB improved to 8.5, blood counts and chemistries are stable  -Will see the pt back in 10 days, tentatively with blood transfusion -The pt has no prohibitive toxicities from continuing C6 CHOP at this time.   -Recommended that the pt increase her Boost/Ensure consumption and do her best to keep eating --Again stressed the importance of the pt establishing care with a PCP. -The pt was able to have Eliquis refilled, and I discussed my recommendation to continue Eliquis until there is no remaining sign of lymphoma and after 6 months of blood thinners  Schedule appointment for 2 units of PRBCs in 10 days RTC with Dr Irene Limbo in 10 days with labs for toxicity check -PET/CT in 5 weeks  -RTC with Dr Irene Limbo in 6 weeks with  labs Port flush appointment with all labs    All of the patients questions were answered with apparent satisfaction. The patient knows to call the clinic with any problems, questions or concerns.  The total time spent in the appt was 20 minutes and more than 50% was on counseling and direct patient cares.     Sullivan Lone MD MS AAHIVMS [facility] Hematology/Oncology Physician [facility]  (Office):       [phone] (Work cell):  [phone] (Fax):           [phone]  I, Baldwin Jamaica, am acting as a scribe for Dr. Irene Limbo  .I have reviewed the above documentation for accuracy and completeness, and I agree with the above. Brunetta Genera MD

## 2018-08-20 ENCOUNTER — Inpatient Hospital Stay: Payer: Self-pay

## 2018-08-20 ENCOUNTER — Inpatient Hospital Stay (HOSPITAL_BASED_OUTPATIENT_CLINIC_OR_DEPARTMENT_OTHER): Payer: Self-pay | Admitting: Hematology

## 2018-08-20 ENCOUNTER — Telehealth: Payer: Self-pay | Admitting: Hematology

## 2018-08-20 ENCOUNTER — Encounter: Payer: Self-pay | Admitting: Hematology

## 2018-08-20 ENCOUNTER — Other Ambulatory Visit: Payer: Self-pay

## 2018-08-20 ENCOUNTER — Inpatient Hospital Stay: Payer: Self-pay | Attending: Hematology

## 2018-08-20 ENCOUNTER — Inpatient Hospital Stay: Payer: Self-pay | Admitting: Nutrition

## 2018-08-20 VITALS — BP 116/54 | HR 91 | Temp 98.6°F | Resp 18 | Ht 61.81 in | Wt 106.2 lb

## 2018-08-20 DIAGNOSIS — D649 Anemia, unspecified: Secondary | ICD-10-CM | POA: Insufficient documentation

## 2018-08-20 DIAGNOSIS — Z7901 Long term (current) use of anticoagulants: Secondary | ICD-10-CM | POA: Insufficient documentation

## 2018-08-20 DIAGNOSIS — C8440 Peripheral T-cell lymphoma, not classified, unspecified site: Secondary | ICD-10-CM

## 2018-08-20 DIAGNOSIS — Z7189 Other specified counseling: Secondary | ICD-10-CM

## 2018-08-20 DIAGNOSIS — Z79899 Other long term (current) drug therapy: Secondary | ICD-10-CM | POA: Insufficient documentation

## 2018-08-20 DIAGNOSIS — I10 Essential (primary) hypertension: Secondary | ICD-10-CM | POA: Insufficient documentation

## 2018-08-20 DIAGNOSIS — D630 Anemia in neoplastic disease: Secondary | ICD-10-CM

## 2018-08-20 DIAGNOSIS — C865 Angioimmunoblastic T-cell lymphoma: Secondary | ICD-10-CM | POA: Insufficient documentation

## 2018-08-20 DIAGNOSIS — J90 Pleural effusion, not elsewhere classified: Secondary | ICD-10-CM | POA: Insufficient documentation

## 2018-08-20 DIAGNOSIS — F17210 Nicotine dependence, cigarettes, uncomplicated: Secondary | ICD-10-CM

## 2018-08-20 DIAGNOSIS — Z5189 Encounter for other specified aftercare: Secondary | ICD-10-CM | POA: Insufficient documentation

## 2018-08-20 DIAGNOSIS — Z5112 Encounter for antineoplastic immunotherapy: Secondary | ICD-10-CM | POA: Insufficient documentation

## 2018-08-20 LAB — CBC WITH DIFFERENTIAL (CANCER CENTER ONLY)
Basophils Absolute: 0 10*3/uL (ref 0.0–0.1)
Basophils Relative: 0 %
Eosinophils Absolute: 0 10*3/uL (ref 0.0–0.5)
Eosinophils Relative: 0 %
HEMATOCRIT: 24.1 % — AB (ref 34.8–46.6)
HEMOGLOBIN: 8.5 g/dL — AB (ref 11.6–15.9)
LYMPHS PCT: 10 %
Lymphs Abs: 0.6 10*3/uL — ABNORMAL LOW (ref 0.9–3.3)
MCH: 29.6 pg (ref 25.1–34.0)
MCHC: 35.2 g/dL (ref 31.5–36.0)
MCV: 84 fL (ref 79.5–101.0)
MONO ABS: 1.3 10*3/uL — AB (ref 0.1–0.9)
MONOS PCT: 23 %
NEUTROS ABS: 3.9 10*3/uL (ref 1.5–6.5)
Neutrophils Relative %: 67 %
Platelet Count: 195 10*3/uL (ref 145–400)
RBC: 2.87 MIL/uL — ABNORMAL LOW (ref 3.70–5.45)
RDW: 14.4 % (ref 11.2–14.5)
WBC Count: 5.8 10*3/uL (ref 3.9–10.3)

## 2018-08-20 LAB — CMP (CANCER CENTER ONLY)
ALK PHOS: 81 U/L (ref 38–126)
ALT: 21 U/L (ref 0–44)
ANION GAP: 11 (ref 5–15)
AST: 23 U/L (ref 15–41)
Albumin: 3.3 g/dL — ABNORMAL LOW (ref 3.5–5.0)
BILIRUBIN TOTAL: 0.5 mg/dL (ref 0.3–1.2)
BUN: 6 mg/dL — AB (ref 8–23)
CALCIUM: 9.8 mg/dL (ref 8.9–10.3)
CO2: 27 mmol/L (ref 22–32)
Chloride: 101 mmol/L (ref 98–111)
Creatinine: 0.66 mg/dL (ref 0.44–1.00)
GFR, Est AFR Am: >60 mL/min (ref >60)
GLUCOSE: 133 mg/dL — AB (ref 70–99)
POTASSIUM: 4.5 mmol/L (ref 3.5–5.1)
Sodium: 139 mmol/L (ref 135–145)
TOTAL PROTEIN: 7 g/dL (ref 6.5–8.1)

## 2018-08-20 LAB — LACTATE DEHYDROGENASE: LDH: 209 U/L — AB (ref 98–192)

## 2018-08-20 MED ORDER — DEXAMETHASONE SODIUM PHOSPHATE 10 MG/ML IJ SOLN
10.0000 mg | Freq: Once | INTRAMUSCULAR | Status: AC
  Administered 2018-08-20: 10 mg via INTRAVENOUS

## 2018-08-20 MED ORDER — SODIUM CHLORIDE 0.9 % IV SOLN
1.9000 mg/kg | INTRAVENOUS | Status: DC
  Administered 2018-08-20: 90 mg via INTRAVENOUS
  Filled 2018-08-20: qty 18

## 2018-08-20 MED ORDER — SODIUM CHLORIDE 0.9 % IV SOLN
Freq: Once | INTRAVENOUS | Status: AC
  Administered 2018-08-20: 11:00:00 via INTRAVENOUS
  Filled 2018-08-20: qty 250

## 2018-08-20 MED ORDER — SODIUM CHLORIDE 0.9% FLUSH
10.0000 mL | INTRAVENOUS | Status: DC | PRN
  Administered 2018-08-20: 10 mL
  Filled 2018-08-20: qty 10

## 2018-08-20 MED ORDER — PALONOSETRON HCL INJECTION 0.25 MG/5ML
INTRAVENOUS | Status: AC
  Filled 2018-08-20: qty 5

## 2018-08-20 MED ORDER — SODIUM CHLORIDE 0.9 % IV SOLN
500.0000 mg/m2 | Freq: Once | INTRAVENOUS | Status: AC
  Administered 2018-08-20: 720 mg via INTRAVENOUS
  Filled 2018-08-20: qty 36

## 2018-08-20 MED ORDER — DEXAMETHASONE SODIUM PHOSPHATE 10 MG/ML IJ SOLN
INTRAMUSCULAR | Status: AC
  Filled 2018-08-20: qty 1

## 2018-08-20 MED ORDER — PALONOSETRON HCL INJECTION 0.25 MG/5ML
0.2500 mg | Freq: Once | INTRAVENOUS | Status: AC
  Administered 2018-08-20: 0.25 mg via INTRAVENOUS

## 2018-08-20 MED ORDER — DOXORUBICIN HCL CHEMO IV INJECTION 2 MG/ML
50.0000 mg/m2 | Freq: Once | INTRAVENOUS | Status: AC
  Administered 2018-08-20: 72 mg via INTRAVENOUS
  Filled 2018-08-20: qty 36

## 2018-08-20 MED ORDER — HEPARIN SOD (PORK) LOCK FLUSH 100 UNIT/ML IV SOLN
500.0000 [IU] | Freq: Once | INTRAVENOUS | Status: AC | PRN
  Administered 2018-08-20: 500 [IU]
  Filled 2018-08-20: qty 5

## 2018-08-20 NOTE — Telephone Encounter (Signed)
Appts scheduled AVS/Calendar printed per 10/2 los °

## 2018-08-20 NOTE — Patient Instructions (Signed)
[facility] Discharge Instructions for Patients Receiving Chemotherapy  Today you received the following chemotherapy agents Adcentris, Cytoxan and Adriamycin   To help prevent nausea and vomiting after your treatment, we encourage you to take your nausea medication as directed.   If you develop nausea and vomiting that is not controlled by your nausea medication, call the clinic.   BELOW ARE SYMPTOMS THAT SHOULD BE REPORTED IMMEDIATELY:  *FEVER GREATER THAN 100.5 F  *CHILLS WITH OR WITHOUT FEVER  NAUSEA AND VOMITING THAT IS NOT CONTROLLED WITH YOUR NAUSEA MEDICATION  *UNUSUAL SHORTNESS OF BREATH  *UNUSUAL BRUISING OR BLEEDING  TENDERNESS IN MOUTH AND THROAT WITH OR WITHOUT PRESENCE OF ULCERS  *URINARY PROBLEMS  *BOWEL PROBLEMS  UNUSUAL RASH Items with * indicate a potential emergency and should be followed up as soon as possible.  Feel free to call the clinic should you have any questions or concerns. The clinic phone number is [phone].  Please show the [place] at check-in to the Emergency Department and triage nurse.

## 2018-08-20 NOTE — Progress Notes (Signed)
Nutrition follow-up completed with patient receiving treatment for lymphoma.   Weight is generally stable and documented as 106.2 pounds October 2. Patient reports she continues to have taste alterations. She has tried some strategies to improve her taste but states they do not work. Reports she is only rinsing her mouth out once every night or every other night. She will consume Carnation breakfast essentials. She denies nausea, vomiting, constipation, and diarrhea.  Nutrition diagnosis: Unintended weight loss continues.  Intervention: I educated patient to rinse her mouth out with baking soda and salt water before meals and snacks as well as in the morning and at night. Recommended patient increase Carnation breakfast essentials. Teach back method used.  Monitoring, evaluation, goals: Patient will work to increase calories and protein to minimize weight loss throughout treatment.  Next visit: To be scheduled as needed.  **Disclaimer: This note was dictated with voice recognition software. Similar sounding words can inadvertently be transcribed and this note may contain transcription errors which may not have been corrected upon publication of note.**

## 2018-08-21 ENCOUNTER — Inpatient Hospital Stay: Payer: Self-pay

## 2018-08-21 ENCOUNTER — Telehealth: Payer: Self-pay | Admitting: Hematology

## 2018-08-21 VITALS — BP 103/35 | HR 83 | Temp 97.8°F | Resp 18

## 2018-08-21 DIAGNOSIS — C8440 Peripheral T-cell lymphoma, not classified, unspecified site: Secondary | ICD-10-CM

## 2018-08-21 DIAGNOSIS — Z7189 Other specified counseling: Secondary | ICD-10-CM

## 2018-08-21 MED ORDER — PEGFILGRASTIM INJECTION 6 MG/0.6ML ~~LOC~~
6.0000 mg | PREFILLED_SYRINGE | Freq: Once | SUBCUTANEOUS | Status: AC
  Administered 2018-08-21: 6 mg via SUBCUTANEOUS

## 2018-08-21 NOTE — Telephone Encounter (Signed)
Printed medical records for patient to pick up this afternoon at the receptionist desk. Release [id number]

## 2018-08-29 ENCOUNTER — Telehealth: Payer: Self-pay | Admitting: Hematology

## 2018-08-29 ENCOUNTER — Encounter: Payer: Self-pay | Admitting: Hematology

## 2018-08-29 ENCOUNTER — Inpatient Hospital Stay: Payer: Self-pay

## 2018-08-29 ENCOUNTER — Inpatient Hospital Stay (HOSPITAL_BASED_OUTPATIENT_CLINIC_OR_DEPARTMENT_OTHER): Payer: Self-pay | Admitting: Hematology

## 2018-08-29 VITALS — BP 85/38 | HR 99 | Temp 98.5°F | Resp 18 | Ht 61.81 in | Wt 106.5 lb

## 2018-08-29 DIAGNOSIS — C865 Angioimmunoblastic T-cell lymphoma: Secondary | ICD-10-CM

## 2018-08-29 DIAGNOSIS — F17210 Nicotine dependence, cigarettes, uncomplicated: Secondary | ICD-10-CM

## 2018-08-29 DIAGNOSIS — D630 Anemia in neoplastic disease: Secondary | ICD-10-CM

## 2018-08-29 DIAGNOSIS — C8440 Peripheral T-cell lymphoma, not classified, unspecified site: Secondary | ICD-10-CM

## 2018-08-29 DIAGNOSIS — D649 Anemia, unspecified: Secondary | ICD-10-CM

## 2018-08-29 DIAGNOSIS — Z95828 Presence of other vascular implants and grafts: Secondary | ICD-10-CM

## 2018-08-29 DIAGNOSIS — D696 Thrombocytopenia, unspecified: Secondary | ICD-10-CM

## 2018-08-29 DIAGNOSIS — D702 Other drug-induced agranulocytosis: Secondary | ICD-10-CM

## 2018-08-29 DIAGNOSIS — I10 Essential (primary) hypertension: Secondary | ICD-10-CM

## 2018-08-29 LAB — SAMPLE TO BLOOD BANK

## 2018-08-29 LAB — CBC WITH DIFFERENTIAL/PLATELET
ABS IMMATURE GRANULOCYTES: 0.05 10*3/uL (ref 0.00–0.07)
BASOS ABS: 0 10*3/uL (ref 0.0–0.1)
Basophils Relative: 0 %
EOS ABS: 0 10*3/uL (ref 0.0–0.5)
EOS PCT: 1 %
HCT: 14.7 % — ABNORMAL LOW (ref 36.0–46.0)
HEMOGLOBIN: 5.1 g/dL — AB (ref 12.0–15.0)
Immature Granulocytes: 4 %
Lymphocytes Relative: 23 %
Lymphs Abs: 0.3 10*3/uL — ABNORMAL LOW (ref 0.7–4.0)
MCH: 29.1 pg (ref 26.0–34.0)
MCHC: 34.7 g/dL (ref 30.0–36.0)
MCV: 84 fL (ref 80.0–100.0)
MONOS PCT: 13 %
Monocytes Absolute: 0.2 10*3/uL (ref 0.1–1.0)
Neutro Abs: 0.7 10*3/uL — ABNORMAL LOW (ref 1.7–7.7)
Neutrophils Relative %: 59 %
PLATELETS: 27 10*3/uL — AB (ref 150–400)
RBC: 1.75 MIL/uL — ABNORMAL LOW (ref 3.87–5.11)
RDW: 14.3 % (ref 11.5–15.5)
WBC: 1.2 10*3/uL — AB (ref 4.0–10.5)
nRBC: 0 % (ref 0.0–0.2)

## 2018-08-29 LAB — RETICULOCYTES
IMMATURE RETIC FRACT: 0 % — AB (ref 2.3–15.9)
RBC.: 1.75 MIL/uL — ABNORMAL LOW (ref 3.87–5.11)
RETIC COUNT ABSOLUTE: 3.3 10*3/uL — AB (ref 19.0–186.0)
RETIC CT PCT: 0.2 % — AB (ref 0.4–3.1)

## 2018-08-29 LAB — CMP (CANCER CENTER ONLY)
ALBUMIN: 3.3 g/dL — AB (ref 3.5–5.0)
ALT: 22 U/L (ref 0–44)
ANION GAP: 10 (ref 5–15)
AST: 17 U/L (ref 15–41)
Alkaline Phosphatase: 88 U/L (ref 38–126)
BUN: 13 mg/dL (ref 8–23)
CHLORIDE: 98 mmol/L (ref 98–111)
CO2: 26 mmol/L (ref 22–32)
Calcium: 10.2 mg/dL (ref 8.9–10.3)
Creatinine: 0.63 mg/dL (ref 0.44–1.00)
GFR, Est AFR Am: >60 mL/min (ref >60)
GLUCOSE: 143 mg/dL — AB (ref 70–99)
POTASSIUM: 4.3 mmol/L (ref 3.5–5.1)
Sodium: 134 mmol/L — ABNORMAL LOW (ref 135–145)
TOTAL PROTEIN: 6.4 g/dL — AB (ref 6.5–8.1)
Total Bilirubin: 0.7 mg/dL (ref 0.3–1.2)

## 2018-08-29 LAB — PREPARE RBC (CROSSMATCH)

## 2018-08-29 MED ORDER — SODIUM CHLORIDE 0.9% FLUSH
10.0000 mL | INTRAVENOUS | Status: AC | PRN
  Administered 2018-08-29: 10 mL
  Filled 2018-08-29: qty 10

## 2018-08-29 MED ORDER — SODIUM CHLORIDE 0.9% FLUSH
10.0000 mL | INTRAVENOUS | Status: DC | PRN
  Administered 2018-08-29: 10 mL
  Filled 2018-08-29: qty 10

## 2018-08-29 MED ORDER — SODIUM CHLORIDE 0.9% IV SOLUTION
250.0000 mL | Freq: Once | INTRAVENOUS | Status: AC
  Administered 2018-08-29: 250 mL via INTRAVENOUS
  Filled 2018-08-29: qty 250

## 2018-08-29 MED ORDER — DIPHENHYDRAMINE HCL 25 MG PO CAPS
25.0000 mg | ORAL_CAPSULE | Freq: Once | ORAL | Status: AC
  Administered 2018-08-29: 25 mg via ORAL

## 2018-08-29 MED ORDER — ACETAMINOPHEN 325 MG PO TABS
650.0000 mg | ORAL_TABLET | Freq: Once | ORAL | Status: AC
  Administered 2018-08-29: 650 mg via ORAL

## 2018-08-29 MED ORDER — ACETAMINOPHEN 325 MG PO TABS
ORAL_TABLET | ORAL | Status: AC
  Filled 2018-08-29: qty 2

## 2018-08-29 MED ORDER — HEPARIN SOD (PORK) LOCK FLUSH 100 UNIT/ML IV SOLN
500.0000 [IU] | Freq: Every day | INTRAVENOUS | Status: AC | PRN
  Administered 2018-08-29: 500 [IU]
  Filled 2018-08-29: qty 5

## 2018-08-29 MED ORDER — DIPHENHYDRAMINE HCL 25 MG PO CAPS
ORAL_CAPSULE | ORAL | Status: AC
  Filled 2018-08-29: qty 1

## 2018-08-29 MED FILL — ELIQUIS 2.5 MG TABLET: 2.5 | 30 days supply | Qty: 60 | Fill #2

## 2018-08-29 NOTE — Telephone Encounter (Signed)
Appts scheduled AVS/Calendar printed per 10/11 los

## 2018-08-29 NOTE — Progress Notes (Signed)
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 female who has been referred to [place] by Dr Joni Reining for evaluation and management of her newly diagnosed angioimmunoblastic T cell lymphoma.   Patient has a h/o HTN, endometriosis, tobacco use who was admitted to the hospital with shortness of breath and was subsequently found to have diffuse lymphadenopathy. She was also found to have bilateral pleural effusions and has had a thoracentesis with pleural fluid demonstrating atypical lymphocytosis. She was discharged but returned to the ED with complaints of persisting SOB, abdominal swelling, and lower extremity swelling.   She has had a  CTA chest and CT abd/pelvis which showed no PE.but extensive LNadenopathy in the chest and throughout the abd with b/l pleural effusion and mild ascites. Also noted to have narrowing in b/l common iliac arteries.  Clinically she is noted to have b/l LE edema 1-2+  Most recent lab results (05/05/18) of CBC  is as follows: all values are WNL except for RBC at 2.59, HGB at 7.7, HCT at 22.7, RDW at 16.0, PLT at 89k.  She has had a CT bone marrow biopsy on 05/05/2018 -results pending. ECHO was done and showed  Systolic function was   normal. The estimated ejection fraction was in the range of 60%   to 65%. Wall motion was normal; there were no regional wall   motion abnormalities. Left ventricular diastolic function   parameters were normal.  Port a cath placement requested.  Patient was transferred to [facility] with her consent to receive C1 of chemotherapy as inpatient.  Interval History:   Deborah Cooley returns today for management and  evaluation of her Angioimmunoblastic T cell lymphoma after C6 of her treatment. The patient's last visit with [place] was on 08/20/18. The pt reports that she is doing well overall.   The pt reports that she has not had any problems with bleeding. She feels very fatigued today, in part due to grief at the very recent loss of her mother, and in part due to her anemia. The pt notes that she has continued to not have any constitutional symptoms.   Lab results today (08/29/18) of CBC w/diff and Reticulocytes is as follows: all values are WNL except for WBC at 1.2k, RBC at 1.75, HGB at 5.1, HCT at 14.7, PLT at 27k, Retic ct pct at 0.2%, Retic ct abs at 3.3k. 08/29/18 CMP is pending  On review of systems, pt reports fatigue, and denies problems bleeding, fevers, chills, night sweats, leg swelling, and any other symptoms.    MEDICAL HISTORY:  Past Medical History:  Diagnosis Date  . Bilateral pleural effusion 04/17/2018  . Endometriosis   . Essential hypertension, benign   . Family history of adverse reaction to anesthesia    "sister stops breathing I think" (04/18/2018)  . Heart murmur   . Mixed hyperlipidemia   . Pneumonia 04/17/2018    SURGICAL HISTORY: Past Surgical History:  Procedure Laterality Date  . ABDOMINAL HYSTERECTOMY  1970s/1980s   for endometriosis  . Arthroscopic right shoulder surgery    . AXILLARY LYMPH NODE BIOPSY Left 04/21/2018   Procedure: LEFT AXILLARY LYMPH NODE BIOPSY;  Surgeon: Kieth Brightly Arta Bruce, MD;  Location:  [facility];  Service: General;  Laterality: Left;  . CARPAL TUNNEL RELEASE Right   . GANGLION CYST EXCISION Right   . IR IMAGING GUIDED PORT INSERTION  05/07/2018  . MULTIPLE TOOTH EXTRACTIONS    . OOPHORECTOMY  1997  . PARTIAL HYSTERECTOMY  1984  . Right hand surgery    . SHOULDER OPEN ROTATOR CUFF REPAIR Right   . VESICOVAGINAL FISTULA CLOSURE W/ TAH      SOCIAL HISTORY: Social History   Socioeconomic History  . Marital status: Married    Spouse name:  Not on file  . Number of children: Not on file  . Years of education: Not on file  . Highest education level: Not on file  Occupational History  . Occupation: Social research officer, government    Comment: works at [employer]  . Financial resource strain: Not on file  . Food insecurity:    Worry: Not on file    Inability: Not on file  . Transportation needs:    Medical: Not on file    Non-medical: Not on file  Tobacco Use  . Smoking status: Current Every Day Smoker    Packs/day: 1.50    Years: 46.00    Pack years: 69.00    Types: Cigarettes  . Smokeless tobacco: Never Used  Substance and Sexual Activity  . Alcohol use: Not Currently    Comment: 04/18/2018 "quit years ago"  . Drug use: Not Currently    Comment: "back in my 30s"  . Sexual activity: Not Currently  Lifestyle  . Physical activity:    Days per week: Not on file    Minutes per session: Not on file  . Stress: Not on file  Relationships  . Social connections:    Talks on phone: Not on file    Gets together: Not on file    Attends religious service: Not on file    Active member of club or organization: Not on file    Attends meetings of clubs or organizations: Not on file    Relationship status: Not on file  . Intimate partner violence:    Fear of current or ex partner: Not on file    Emotionally abused: Not on file    Physically abused: Not on file    Forced sexual activity: Not on file  Other Topics Concern  . Not on file  Social History Narrative   ** Merged History Encounter **       Patient has one son whom is healthy.   Has a live in boyfriend.    FAMILY HISTORY: Family History  Problem Relation Age of Onset  . Diabetes Mother        age 58  . Heart attack Father        died age 24's  . Hypertension Father   . Cancer Sister        breast cancer diagonsed age 30 years  . Diabetes Unknown   . Heart disease Unknown        Female <55  . Arthritis Unknown   . Asthma Unknown   .  Hyperlipidemia Other        several siblings  . Thyroid disease Sister        one sister with thyroid disease    ALLERGIES:  is allergic to [place] [zolpidem].  MEDICATIONS:  Current Outpatient Medications  Medication Sig Dispense Refill  . acetaminophen (TYLENOL) 325 MG tablet Take 2 tablets (650 mg total) by mouth every  6 (six) hours as needed for mild pain (or Fever >/= 101).    Marland Kitchen apixaban (ELIQUIS) 2.5 MG TABS tablet Take 1 tablet (2.5 mg total) by mouth 2 (two) times daily. 60 tablet 3  . lidocaine-prilocaine (EMLA) cream Apply a dime size to port-a-cath 1-2 hours prior to access. Cover with saran wrap. 30 g 2  . Multiple Vitamin (MULTIVITAMIN WITH MINERALS) TABS tablet Take 1 tablet by mouth daily. 30 tablet 0  . polyethylene glycol (MIRALAX / GLYCOLAX) packet Take 17 g by mouth daily as needed for mild constipation. 30 each 0  . potassium chloride SA (K-DUR,KLOR-CON) 20 MEQ tablet 40 meq orally twice daily for 2 daily then 67mq PO daily 60 tablet 0  . predniSONE (DELTASONE) 20 MG tablet Take 3 tablets (60 mg total) by mouth daily. Take on days 1-5 of chemotherapy. 15 tablet 0  . LORazepam (ATIVAN) 0.5 MG tablet Take 1 tablet (0.5 mg total) by mouth every 8 (eight) hours as needed for anxiety. (Patient not taking: Reported on 08/11/2018) 30 tablet 0  . ondansetron (ZOFRAN) 8 MG tablet Take 1 tablet (8 mg total) by mouth every 8 (eight) hours as needed for nausea. Start on day 3 after chemotherapy (Patient not taking: Reported on 08/11/2018) 30 tablet 3  . prochlorperazine (COMPAZINE) 10 MG tablet Take 1 tablet (10 mg total) by mouth every 6 (six) hours as needed for nausea or vomiting. (Patient not taking: Reported on 08/11/2018) 30 tablet 1   No current facility-administered medications for this visit.     REVIEW OF SYSTEMS:    A 10+ POINT REVIEW OF SYSTEMS WAS OBTAINED including neurology, dermatology, psychiatry, cardiac, respiratory, lymph, extremities, GI, GU, Musculoskeletal,  constitutional, breasts, reproductive, HEENT.  All pertinent positives are noted in the HPI.  All others are negative.   PHYSICAL EXAMINATION: ECOG PERFORMANCE STATUS: 2 - Symptomatic, <50% confined to bed  VS reviewed in Epic  GENERAL:alert, in no acute distress and comfortable SKIN: no acute rashes, no significant lesions EYES: conjunctival pallor (+), sclera anicteric OROPHARYNX: MMM, no exudates, no oropharyngeal erythema or ulceration NECK: supple, no JVD LYMPH:  no palpable lymphadenopathy in the cervical, axillary or inguinal regions LUNGS: decreased breath sounds 1/5 up right  HEART: regular rate & rhythm ABDOMEN:  normoactive bowel sounds , non tender, not distended. No palpable hepatosplenomegaly.  Extremity: no pedal edema PSYCH: alert & oriented x 3 with fluent speech NEURO: no focal motor/sensory deficits   LABORATORY DATA:  I have reviewed the data as listed  . CBC Latest Ref Rng & Units 08/29/2018 08/20/2018 08/11/2018  WBC 4.0 - 10.5 K/uL 1.2(L) 5.8 3.3(L)  Hemoglobin 12.0 - 15.0 g/dL 5.1(LL) 8.5(L) 6.5(LL)  Hematocrit 36.0 - 46.0 % 14.7(L) 24.1(L) 18.5(L)  Platelets 150 - 400 K/uL 27(L) 195 72(L)  ANC 700  CBC    Component Value Date/Time   WBC 1.2 (L) 08/29/2018 0805   RBC 1.75 (L) 08/29/2018 0805   RBC 1.75 (L) 08/29/2018 0805   HGB 5.1 (LL) 08/29/2018 0805   HGB 8.5 (L) 08/20/2018 0741   HCT 14.7 (L) 08/29/2018 0805   PLT 27 (L) 08/29/2018 0805   PLT 195 08/20/2018 0741   MCV 84.0 08/29/2018 0805   MCH 29.1 08/29/2018 0805   MCHC 34.7 08/29/2018 0805   RDW 14.3 08/29/2018 0805   LYMPHSABS PENDING 08/29/2018 0805   MONOABS PENDING 08/29/2018 0805   EOSABS PENDING 08/29/2018 0805   BASOSABS PENDING 08/29/2018 0805    . CMP Latest Ref  Rng & Units 08/29/2018 08/20/2018 08/11/2018  Glucose 70 - 99 mg/dL 143(H) 133(H) 174(H)  BUN 8 - 23 mg/dL 13 6(L) 9  Creatinine 0.44 - 1.00 mg/dL 0.63 0.66 0.68  Sodium 135 - 145 mmol/L 134(L) 139 136  Potassium  3.5 - 5.1 mmol/L 4.3 4.5 4.1  Chloride 98 - 111 mmol/L 98 101 100  CO2 22 - 32 mmol/L _0 Calcium 8.9 - 10.3 mg/dL 10.2 9.8 9.5  Total Protein 6.5 - 8.1 g/dL 6.4(L) 7.0 6.5  Total Bilirubin 0.3 - 1.2 mg/dL 0.7 0.5 0.7  Alkaline Phos 38 - 126 U/L 88 81 90  AST 15 - 41 U/L _1 ALT 0 - 44 U/L _2 . Lab Results  Component Value Date   LDH 209 (H) 08/20/2018      Component     Latest Ref Rng & Units 04/18/2018 05/03/2018  Hepatitis B Surface Ag     Negative  Negative  HCV Ab     0.0 - 0.9 s/co ratio  <0.1  Hep A Ab, IgM     Negative  Negative  Hep B Core Ab, IgM     Negative  Negative  HIV Screen 4th Generation wRfx     Non Reactive Non Reactive     04/21/18 Molecular Pathology:   04/21/18 Surgical Pathology:    RADIOGRAPHIC STUDIES: I have personally reviewed the radiological images as listed and agreed with the findings in the report. No results found.  ASSESSMENT & PLAN:   62 y.o. female with  1. 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 S/P C1 OF CHOP on 05/08/2018 S/p C2 of Brentuximab-CHP on 05/28/2018  S/p C3 of Brentuximab-CHP on 06/18/2018 S/p C4 of Brentuximab-CHP on  07/09/2018 S/p C5 of Brentuximab-CHP on  07/30/2018   07/01/18 PET/CT revealed Resolution of axillary, mediastinal, periaortic, and inguinal lymphadenopathy. ( [place] 1) 2. Decrease in size of spleen.  Normal metabolic activity. 3. Uniform increase in marrow metabolic activity is most consistent with GCSF type response.   Discussed the option to pursue BM transplant after completing treatment and that I would be happy to refer the pt to [facility] for a discussion with a transplant team - this was done but not possible due to lack of insurance.  2. General LNadenopathy from lymphoma with b/l pleural effusion and b/l lower extremity weakness- resolved  3. Anemia/thrombocytopenia - from Bone marrow  involvement by lymphoma + Chemotherapy BM Bx confirms involved by T cell lymphoma. hgb better after PRBC transfusion.  4. 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 LDH wnl no evidnece of overt hemolysis at this timne   5. CMV IgM+ --  Component     Latest Ref Rng & Units 05/09/2018  CMV Quant DNA PCR (Plasma)     Negative IU/mL 8,430  Log10 CMV Qn DNA Pl     log10 IU/mL 3.926    7. Port a cath in situ  8. 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 S/p Acute hypoxic respiratory failure - required intubation --now extubated and with minimal oxygen needs.  ?etiology Re-expansion pulmonary edema post large volume thoracentesis vs aspiration Though the rapidity of presentation is unusual would need to consider HCAP and CMV pneumonitis (CMV IgM+ and PCV positive with worsening). Resolved hypoxia - treated with antibiotics for aspiration pneumonia.  10 . RUE DVT - US 6/25 -- was briefly on IV Heparin but held given thrombocytopenia -- currently on Eliquis  PLAN: -Recommended that the pt increase her Boost/Ensure consumption and do her best to keep eating -Again stressed the importance of the pt establishing care with a PCP. -hold eliquis while platelets are <50k. -Discussed pt labwork today, 08/29/18; WBC at 1.2k, HGB at 5.1, PLT at 27k -Will order 2 units PRBCs  -No current concerns for bleeding  -Will check labs again in one week to evaluate for transfusion necessity, and to monitor Platelets  -Proceed with post-treatment PET/CT on 09/24/18 -Continue follow up with Social work  -Will see the pt back in 4 weeks   2 units of PRBC today Labs and prn transfusion on 09/03/2018 F/u for PET/CT as scheduled on 11/6 RTC with Dr Irene Limbo in 4 weeks with labs    All of the patients questions were answered with apparent satisfaction. The patient knows to call the clinic with any problems, questions or  concerns.  The total time spent in the appt was 25 minutes and more than 50% was on counseling and direct patient cares.     Sullivan Lone MD MS AAHIVMS [facility] Hematology/Oncology Physician [facility]  (Office):       [phone] (Work cell):  [phone] (Fax):           [phone]  I, Baldwin [place], am acting as a scribe for Dr. Irene Limbo  .I have reviewed the above documentation for accuracy and completeness, and I agree with the above. Brunetta Genera MD

## 2018-08-29 NOTE — Patient Instructions (Signed)

## 2018-09-01 LAB — TYPE AND SCREEN
ABO/RH(D): O POS
Antibody Screen: NEGATIVE
UNIT DIVISION: 0
Unit division: 0

## 2018-09-01 LAB — BPAM RBC
BLOOD PRODUCT EXPIRATION DATE: 201911082359
BLOOD PRODUCT EXPIRATION DATE: 201911082359
ISSUE DATE / TIME: 201910111110
ISSUE DATE / TIME: 201910111313
UNIT TYPE AND RH: 5100
Unit Type and Rh: 5100

## 2018-09-02 LAB — MULTIPLE MYELOMA PANEL, SERUM
ALBUMIN/GLOB SERPL: 1.5 (ref 0.7–1.7)
ALPHA 1: 0.1 g/dL (ref 0.0–0.4)
ALPHA2 GLOB SERPL ELPH-MCNC: 0.9 g/dL (ref 0.4–1.0)
Albumin SerPl Elph-Mcnc: 3.4 g/dL (ref 2.9–4.4)
B-Globulin SerPl Elph-Mcnc: 0.8 g/dL (ref 0.7–1.3)
GAMMA GLOB SERPL ELPH-MCNC: 0.6 g/dL (ref 0.4–1.8)
GLOBULIN, TOTAL: 2.4 g/dL (ref 2.2–3.9)
IGG (IMMUNOGLOBIN G), SERUM: 794 mg/dL (ref 700–1600)
IgA: 187 mg/dL (ref 87–352)
IgM (Immunoglobulin M), Srm: 15 mg/dL — ABNORMAL LOW (ref 26–217)
Total Protein ELP: 5.8 g/dL — ABNORMAL LOW (ref 6.0–8.5)

## 2018-09-03 ENCOUNTER — Inpatient Hospital Stay: Payer: Self-pay

## 2018-09-03 ENCOUNTER — Other Ambulatory Visit: Payer: Self-pay | Admitting: *Deleted

## 2018-09-03 DIAGNOSIS — C8440 Peripheral T-cell lymphoma, not classified, unspecified site: Secondary | ICD-10-CM

## 2018-09-03 DIAGNOSIS — D649 Anemia, unspecified: Secondary | ICD-10-CM

## 2018-09-03 DIAGNOSIS — Z95828 Presence of other vascular implants and grafts: Secondary | ICD-10-CM

## 2018-09-03 LAB — CBC WITH DIFFERENTIAL/PLATELET
Abs Immature Granulocytes: 0.04 10*3/uL (ref 0.00–0.07)
BASOS ABS: 0 10*3/uL (ref 0.0–0.1)
Basophils Relative: 0 %
EOS PCT: 0 %
Eosinophils Absolute: 0 10*3/uL (ref 0.0–0.5)
HCT: 20.7 % — ABNORMAL LOW (ref 36.0–46.0)
HEMOGLOBIN: 7.1 g/dL — AB (ref 12.0–15.0)
IMMATURE GRANULOCYTES: 1 %
LYMPHS ABS: 0.4 10*3/uL — AB (ref 0.7–4.0)
LYMPHS PCT: 9 %
MCH: 29.2 pg (ref 26.0–34.0)
MCHC: 34.3 g/dL (ref 30.0–36.0)
MCV: 85.2 fL (ref 80.0–100.0)
MONOS PCT: 20 %
Monocytes Absolute: 0.8 10*3/uL (ref 0.1–1.0)
NRBC: 0 % (ref 0.0–0.2)
Neutro Abs: 2.6 10*3/uL (ref 1.7–7.7)
Neutrophils Relative %: 70 %
Platelets: 117 10*3/uL — ABNORMAL LOW (ref 150–400)
RBC: 2.43 MIL/uL — ABNORMAL LOW (ref 3.87–5.11)
RDW: 14.5 % (ref 11.5–15.5)
WBC: 3.7 10*3/uL — ABNORMAL LOW (ref 4.0–10.5)

## 2018-09-03 LAB — SAMPLE TO BLOOD BANK

## 2018-09-03 LAB — PREPARE RBC (CROSSMATCH)

## 2018-09-03 MED ORDER — SODIUM CHLORIDE 0.9% FLUSH
10.0000 mL | INTRAVENOUS | Status: DC | PRN
  Administered 2018-09-03: 10 mL
  Filled 2018-09-03: qty 10

## 2018-09-03 MED ORDER — SODIUM CHLORIDE 0.9% FLUSH
10.0000 mL | INTRAVENOUS | Status: AC | PRN
  Administered 2018-09-03: 10 mL
  Filled 2018-09-03: qty 10

## 2018-09-03 MED ORDER — DIPHENHYDRAMINE HCL 25 MG PO CAPS
25.0000 mg | ORAL_CAPSULE | Freq: Once | ORAL | Status: AC
  Administered 2018-09-03: 25 mg via ORAL

## 2018-09-03 MED ORDER — SODIUM CHLORIDE 0.9% IV SOLUTION
250.0000 mL | Freq: Once | INTRAVENOUS | Status: AC
  Administered 2018-09-03: 250 mL via INTRAVENOUS
  Filled 2018-09-03: qty 250

## 2018-09-03 MED ORDER — HEPARIN SOD (PORK) LOCK FLUSH 100 UNIT/ML IV SOLN
500.0000 [IU] | Freq: Once | INTRAVENOUS | Status: DC | PRN
  Filled 2018-09-03: qty 5

## 2018-09-03 MED ORDER — ACETAMINOPHEN 325 MG PO TABS
650.0000 mg | ORAL_TABLET | Freq: Once | ORAL | Status: AC
  Administered 2018-09-03: 650 mg via ORAL

## 2018-09-03 MED ORDER — HEPARIN SOD (PORK) LOCK FLUSH 100 UNIT/ML IV SOLN
500.0000 [IU] | Freq: Every day | INTRAVENOUS | Status: AC | PRN
  Administered 2018-09-03: 500 [IU]
  Filled 2018-09-03: qty 5

## 2018-09-03 NOTE — Patient Instructions (Signed)

## 2018-09-04 LAB — TYPE AND SCREEN
ABO/RH(D): O POS
ANTIBODY SCREEN: NEGATIVE
UNIT DIVISION: 0
UNIT DIVISION: 0

## 2018-09-04 LAB — BPAM RBC
BLOOD PRODUCT EXPIRATION DATE: 201911132359
Blood Product Expiration Date: 201911132359
ISSUE DATE / TIME: 201910161203
ISSUE DATE / TIME: 201910161203
UNIT TYPE AND RH: 5100
UNIT TYPE AND RH: 5100

## 2018-09-08 ENCOUNTER — Telehealth: Payer: Self-pay

## 2018-09-08 NOTE — Telephone Encounter (Signed)
Per result note from Dr. Irene Limbo, requesting confirmation that pt is holding eliquis while plt are < 50k. Called pt to discuss, and pt currently still taking eliquis. Pt told to hold at this time. Will re-evaluate at next visit 09/29/18 based on lab results. Pt verbalized understanding.

## 2018-09-10 ENCOUNTER — Ambulatory Visit: Payer: Self-pay

## 2018-09-10 ENCOUNTER — Ambulatory Visit: Payer: Self-pay | Admitting: Hematology

## 2018-09-10 ENCOUNTER — Other Ambulatory Visit: Payer: Self-pay

## 2018-09-11 ENCOUNTER — Ambulatory Visit: Payer: Self-pay

## 2018-09-16 ENCOUNTER — Other Ambulatory Visit: Payer: Self-pay | Admitting: Hematology

## 2018-09-24 ENCOUNTER — Encounter (HOSPITAL_COMMUNITY)
Admission: RE | Admit: 2018-09-24 | Discharge: 2018-09-24 | Disposition: A | Discharge status: Home / Self Care | Payer: Self-pay | Source: Ambulatory Visit | Attending: Hematology | Admitting: Hematology

## 2018-09-24 DIAGNOSIS — C8440 Peripheral T-cell lymphoma, not classified, unspecified site: Secondary | ICD-10-CM | POA: Insufficient documentation

## 2018-09-24 LAB — GLUCOSE, CAPILLARY: Glucose-Capillary: 106 mg/dL — ABNORMAL HIGH (ref 70–99)

## 2018-09-24 MED ORDER — FLUDEOXYGLUCOSE F - 18 (FDG) INJECTION
5.3600 | Freq: Once | INTRAVENOUS | Status: AC | PRN
  Administered 2018-09-24: 5.36 via INTRAVENOUS

## 2018-09-25 ENCOUNTER — Telehealth: Payer: Self-pay | Admitting: Hematology

## 2018-09-25 NOTE — Telephone Encounter (Signed)
[facility]K out 11/11 - per [place] moved appointments 10 days out. Spoke with patient re appointments for 11/20.

## 2018-09-29 ENCOUNTER — Other Ambulatory Visit: Payer: Self-pay

## 2018-09-29 ENCOUNTER — Ambulatory Visit: Payer: Self-pay | Admitting: Hematology

## 2018-10-01 ENCOUNTER — Other Ambulatory Visit: Payer: Self-pay

## 2018-10-01 ENCOUNTER — Ambulatory Visit: Payer: Self-pay | Admitting: Hematology

## 2018-10-07 NOTE — Progress Notes (Signed)
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 female who has been referred to [place] by Dr Joni Reining for evaluation and management of her newly diagnosed angioimmunoblastic T cell lymphoma.   Patient has a h/o HTN, endometriosis, tobacco use who was admitted to the hospital with shortness of breath and was subsequently found to have diffuse lymphadenopathy. She was also found to have bilateral pleural effusions and has had a thoracentesis with pleural fluid demonstrating atypical lymphocytosis. She was discharged but returned to the ED with complaints of persisting SOB, abdominal swelling, and lower extremity swelling.   She has had a  CTA chest and CT abd/pelvis which showed no PE.but extensive LNadenopathy in the chest and throughout the abd with b/l pleural effusion and mild ascites. Also noted to have narrowing in b/l common iliac arteries.  Clinically she is noted to have b/l LE edema 1-2+  Most recent lab results (05/05/18) of CBC  is as follows: all values are WNL except for RBC at 2.59, HGB at 7.7, HCT at 22.7, RDW at 16.0, PLT at 89k.  She has had a CT bone marrow biopsy on 05/05/2018 -results pending. ECHO was done and showed  Systolic function was   normal. The estimated ejection fraction was in the range of 60%   to 65%. Wall motion was normal; there were no regional wall   motion abnormalities. Left ventricular diastolic function   parameters were normal.  Port a cath placement requested.  Patient was transferred to [facility] with her consent to receive C1 of chemotherapy as inpatient.  Interval History:   Deborah Cooley returns today for management and  evaluation of her Angioimmunoblastic T cell lymphoma after C6 of her treatment. The patient's last visit with [place] was on 08/29/18. She is accompanied today by her partner. The pt reports that she is doing well overall.   The pt reports that she has been doing very well in the interim, feeling improved energy levels, improved appetite, has been eating better, and has gained 5 pounds.   The pt notes that her application for insurance is pending, which is a factor in her candidacy for some transplant considerations.   Of note since the patient's last visit, pt has had a PET/CT completed on 09/24/18 with results revealing No evidence of residual or recurrent hypermetabolic lymphoma. (Deauville 1). 2. 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 Recommend assessment of coronary risk factors and consideration of medical therapy. 3. Aortic atherosclerosis and emphysema.  Lab results today (10/08/18) of CBC w/diff, CMP, and Reticulocytes is as follows: all values are WNL except for WBC at 3.7k, RBC at 3.24, HGB at 10.4, HCT at 32.1, RDW at 20.0, PLT at 135k, Glucose at 176, ALT at 66. 10/08/18 LDH is normal at 173  On review of systems, pt reports weight gain, eating well, strong appetite, good energy levels, and denies fevers, chills, night sweats, abdominal pains, leg swelling, and any other symptoms.    MEDICAL HISTORY:  Past Medical History:  Diagnosis Date  . Bilateral pleural effusion 04/17/2018  . Endometriosis   . Essential hypertension, benign   . Family history of adverse reaction to anesthesia    "sister stops breathing I think" (  04/18/2018)  . Heart murmur   . Mixed hyperlipidemia   . Pneumonia 04/17/2018    SURGICAL HISTORY: Past Surgical History:  Procedure Laterality Date  . ABDOMINAL HYSTERECTOMY  1970s/1980s   for endometriosis  . Arthroscopic right shoulder surgery    . AXILLARY LYMPH NODE BIOPSY Left 04/21/2018   Procedure: LEFT AXILLARY LYMPH NODE BIOPSY;  Surgeon:  Kieth Brightly, Arta Bruce, MD;  Location: [place];  Service: General;  Laterality: Left;  . CARPAL TUNNEL RELEASE Right   . GANGLION CYST EXCISION Right   . IR IMAGING GUIDED PORT INSERTION  05/07/2018  . MULTIPLE TOOTH EXTRACTIONS    . OOPHORECTOMY  1997  . PARTIAL HYSTERECTOMY  1984  . Right hand surgery    . SHOULDER OPEN ROTATOR CUFF REPAIR Right   . VESICOVAGINAL FISTULA CLOSURE W/ TAH      SOCIAL HISTORY: Social History   Socioeconomic History  . Marital status: Married    Spouse name: Not on file  . Number of children: Not on file  . Years of education: Not on file  . Highest education level: Not on file  Occupational History  . Occupation: Social research officer, government    Comment: works at [employer]  . Financial resource strain: Not on file  . Food insecurity:    Worry: Not on file    Inability: Not on file  . Transportation needs:    Medical: Not on file    Non-medical: Not on file  Tobacco Use  . Smoking status: Current Every Day Smoker    Packs/day: 1.50    Years: 46.00    Pack years: 69.00    Types: Cigarettes  . Smokeless tobacco: Never Used  Substance and Sexual Activity  . Alcohol use: Not Currently    Comment: 04/18/2018 "quit years ago"  . Drug use: Not Currently    Comment: "back in my 21s"  . Sexual activity: Not Currently  Lifestyle  . Physical activity:    Days per week: Not on file    Minutes per session: Not on file  . Stress: Not on file  Relationships  . Social connections:    Talks on phone: Not on file    Gets together: Not on file    Attends religious service: Not on file    Active member of club or organization: Not on file    Attends meetings of clubs or organizations: Not on file    Relationship status: Not on file  . Intimate partner violence:    Fear of current or ex partner: Not on file    Emotionally abused: Not on file    Physically abused: Not on file    Forced sexual activity: Not on file  Other Topics Concern  .  Not on file  Social History Narrative   ** Merged History Encounter **       Patient has one son whom is healthy.   Has a live in boyfriend.    FAMILY HISTORY: Family History  Problem Relation Age of Onset  . Diabetes Mother        age 1  . Heart attack Father        died age 60's  . Hypertension Father   . Cancer Sister        breast cancer diagonsed age 98 years  . Diabetes Unknown   . Heart disease Unknown        Female <55  . Arthritis Unknown   . Asthma  Unknown   . Hyperlipidemia Other        several siblings  . Thyroid disease Sister        one sister with thyroid disease    ALLERGIES:  is allergic to [place] [zolpidem].  MEDICATIONS:  Current Outpatient Medications  Medication Sig Dispense Refill  . acetaminophen (TYLENOL) 325 MG tablet Take 2 tablets (650 mg total) by mouth every 6 (six) hours as needed for mild pain (or Fever >/= 101).    Marland Kitchen apixaban (ELIQUIS) 2.5 MG TABS tablet Take 1 tablet (2.5 mg total) by mouth 2 (two) times daily. 60 tablet 3  . lidocaine-prilocaine (EMLA) cream Apply a dime size to port-a-cath 1-2 hours prior to access. Cover with saran wrap. 30 g 2  . LORazepam (ATIVAN) 0.5 MG tablet Take 1 tablet (0.5 mg total) by mouth every 8 (eight) hours as needed for anxiety. (Patient not taking: Reported on 08/11/2018) 30 tablet 0  . Multiple Vitamin (MULTIVITAMIN WITH MINERALS) TABS tablet Take 1 tablet by mouth daily. 30 tablet 0  . ondansetron (ZOFRAN) 8 MG tablet Take 1 tablet (8 mg total) by mouth every 8 (eight) hours as needed for nausea. Start on day 3 after chemotherapy (Patient not taking: Reported on 08/11/2018) 30 tablet 3  . polyethylene glycol (MIRALAX / GLYCOLAX) packet Take 17 g by mouth daily as needed for mild constipation. 30 each 0  . prochlorperazine (COMPAZINE) 10 MG tablet Take 1 tablet (10 mg total) by mouth every 6 (six) hours as needed for nausea or vomiting. (Patient not taking: Reported on 08/11/2018) 30 tablet 1   No current  facility-administered medications for this visit.     REVIEW OF SYSTEMS:    A 10+ POINT REVIEW OF SYSTEMS WAS OBTAINED including neurology, dermatology, psychiatry, cardiac, respiratory, lymph, extremities, GI, GU, Musculoskeletal, constitutional, breasts, reproductive, HEENT.  All pertinent positives are noted in the HPI.  All others are negative.  PHYSICAL EXAMINATION: ECOG PERFORMANCE STATUS: 2 - Symptomatic, <50% confined to bed  VS reviewed in Epic  GENERAL:alert, in no acute distress and comfortable SKIN: no acute rashes, no significant lesions EYES: conjunctival pallor (+), sclera anicteric OROPHARYNX: MMM, no exudates, no oropharyngeal erythema or ulceration NECK: supple, no JVD LYMPH:  no palpable lymphadenopathy in the cervical, axillary or inguinal regions LUNGS: decreased breath sounds 1/5 up right  HEART: regular rate & rhythm ABDOMEN:  normoactive bowel sounds , non tender, not distended. No palpable hepatosplenomegaly.  Extremity: no pedal edema PSYCH: alert & oriented x 3 with fluent speech NEURO: no focal motor/sensory deficits   LABORATORY DATA:  I have reviewed the data as listed  . CBC Latest Ref Rng & Units 10/08/2018 09/03/2018 08/29/2018  WBC 4.0 - 10.5 K/uL 3.7(L) 3.7(L) 1.2(L)  Hemoglobin 12.0 - 15.0 g/dL 10.4(L) 7.1(L) 5.1(LL)  Hematocrit 36.0 - 46.0 % 32.1(L) 20.7(L) 14.7(L)  Platelets 150 - 400 K/uL 135(L) 117(L) 27(L)  ANC 700  CBC    Component Value Date/Time   WBC 3.7 (L) 10/08/2018 0947   RBC 3.24 (L) 10/08/2018 0947   HGB 10.4 (L) 10/08/2018 0947   HGB 8.5 (L) 08/20/2018 0741   HCT 32.1 (L) 10/08/2018 0947   PLT 135 (L) 10/08/2018 0947   PLT 195 08/20/2018 0741   MCV 99.1 10/08/2018 0947   MCH 32.1 10/08/2018 0947   MCHC 32.4 10/08/2018 0947   RDW 20.0 (H) 10/08/2018 0947   LYMPHSABS 1.0 10/08/2018 0947   MONOABS 0.5 10/08/2018 0947   EOSABS 0.1 10/08/2018 0947  BASOSABS 0.0 10/08/2018 0947    . CMP Latest Ref Rng & Units  10/08/2018 08/29/2018 08/20/2018  Glucose 70 - 99 mg/dL 176(H) 143(H) 133(H)  BUN 8 - 23 mg/dL 12 13 6(L)  Creatinine 0.44 - 1.00 mg/dL 0.67 0.63 0.66  Sodium 135 - 145 mmol/L 140 134(L) 139  Potassium 3.5 - 5.1 mmol/L 3.7 4.3 4.5  Chloride 98 - 111 mmol/L 104 98 101  CO2 22 - 32 mmol/L _0 Calcium 8.9 - 10.3 mg/dL 9.5 10.2 9.8  Total Protein 6.5 - 8.1 g/dL 7.0 6.4(L) 7.0  Total Bilirubin 0.3 - 1.2 mg/dL 0.6 0.7 0.5  Alkaline Phos 38 - 126 U/L 89 88 81  AST 15 - 41 U/L 37 17 23  ALT 0 - 44 U/L 66(H) 22 21    . Lab Results  Component Value Date   LDH 173 10/08/2018      Component     Latest Ref Rng & Units 04/18/2018 05/03/2018  Hepatitis B Surface Ag     Negative  Negative  HCV Ab     0.0 - 0.9 s/co ratio  <0.1  Hep A Ab, IgM     Negative  Negative  Hep B Core Ab, IgM     Negative  Negative  HIV Screen 4th Generation wRfx     Non Reactive Non Reactive     04/21/18 Molecular Pathology:   04/21/18 Surgical Pathology:    RADIOGRAPHIC STUDIES: I have personally reviewed the radiological images as listed and agreed with the findings in the report. Nm Pet Image Restag (ps) Skull Base To Thigh  Result Date: 09/24/2018 CLINICAL DATA:  Subsequent treatment strategy for restaging of angioimmunoblastic lymphoma. EXAM: NUCLEAR MEDICINE PET SKULL BASE TO THIGH TECHNIQUE: 5.4 mCi F-18 FDG was injected intravenously. Full-ring PET imaging was performed from the skull base to thigh after the radiotracer. CT data was obtained and used for attenuation correction and anatomic localization. Fasting blood glucose: 106 mg/dl COMPARISON:  07/01/2018 FINDINGS: Mediastinal blood pool activity: SUV max 2.3 NECK: No areas of abnormal hypermetabolism. Incidental CT findings: Bilateral carotid atherosclerosis. No cervical adenopathy. A calcified right thyroid nodule measures 2.5 cm and is unchanged. CHEST: No pulmonary parenchymal or thoracic nodal hypermetabolism. Incidental CT findings: Right  Port-A-Cath tip at superior caval/atrial junction. Normal heart size with multivessel coronary artery calcification. Moderate centrilobular emphysema. ABDOMEN/PELVIS: No abdominopelvic parenchymal or nodal hypermetabolism. Incidental CT findings: Index left periaortic node measures 10 mm on image 118/4 versus 11 mm on the prior. Hysterectomy. Abdominal aortic atherosclerosis. SKELETON: Resolution of diffuse marrow hypermetabolism which was secondary to stimulation by chemotherapy. Incidental CT findings: Right-sided L1 vertebral hemangioma. IMPRESSION: 1. No evidence of residual or recurrent hypermetabolic lymphoma. (Deauville 1). 2. 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 Recommend assessment of coronary risk factors and consideration of medical therapy. 3. Aortic atherosclerosis (ICD10-I70.0) and emphysema (ICD10-J43.9). Electronically Signed   By: Abigail Miyamoto M.D.   On: 09/24/2018 14:16    ASSESSMENT & PLAN:   62 y.o. female with  1. 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 S/P C1 OF CHOP on 05/08/2018 S/p C2 of Brentuximab-CHP on 05/28/2018  S/p C3 of Brentuximab-CHP on 06/18/2018 S/p C4 of Brentuximab-CHP on  07/09/2018 S/p C5 of Brentuximab-CHP on  07/30/2018   07/01/18 PET/CT revealed Resolution of axillary, mediastinal, periaortic, and inguinal lymphadenopathy. ( Deauville 1) 2. Decrease in size of spleen.  Normal metabolic activity. 3. Uniform increase in marrow  metabolic activity is most consistent with GCSF type response.   Discussed the option to pursue BM transplant after completing treatment and that I would be happy to refer the pt to [facility] for a discussion with a transplant team - this was done but not possible due to lack of insurance.  2. General LNadenopathy from lymphoma with b/l pleural effusion and b/l lower extremity weakness- resolved  3. Anemia/thrombocytopenia - from Bone marrow  involvement by lymphoma + Chemotherapy BM Bx confirms involved by T cell lymphoma. hgb better after PRBC transfusion.  4. 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 LDH wnl no evidnece of overt hemolysis at this timne   5. CMV IgM+ --  Component     Latest Ref Rng & Units 05/09/2018  CMV Quant DNA PCR (Plasma)     Negative IU/mL 8,430  Log10 CMV Qn DNA Pl     log10 IU/mL 3.926    7. Port a cath in situ  8. 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 S/p Acute hypoxic respiratory failure - required intubation --now extubated and with minimal oxygen needs.  ?etiology Re-expansion pulmonary edema post large volume thoracentesis vs aspiration Though the rapidity of presentation is unusual would need to consider HCAP and CMV pneumonitis (CMV IgM+ and PCV positive with worsening). Resolved hypoxia - treated with antibiotics for aspiration pneumonia.  10 . RUE DVT - US 6/25 -- was briefly on IV Heparin but held given thrombocytopenia -- currently on Eliquis  PLAN: -Again stressed the importance of the pt establishing care with a PCP. -Discussed pt labwork today, 10/08/18; HGB improved to 10.4, blood counts and chemistries are overall improved  -Discussed the 09/24/18 PET/CT which revealed No evidence of residual or recurrent hypermetabolic lymphoma. (Deauville 1). 2. 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 Recommend assessment of coronary risk factors and consideration of medical therapy. 3. Aortic atherosclerosis and emphysema. -Discussed again the option to pursue autologous bone marrow transplant. Will refer the pt to [facility] -Will refer pt to Social Work again for continued aid in Print production planner -Recommend annual flu vaccine and every 5 year pneumonia vaccines which the pt does not want at this time  -Will order BM Bx if the pt is not able to be seen at [facility] quickly -Recommended that the pt  continue to eat well, drink at least 48-64 oz of water each day, and walk 20-30 minutes each day.  -Will plan to see the pt back in 2 months, sooner if any new concerns    -SW consultation -- insurance issues serving as barrier for transplantation based treatment options -referral [facility] for consideration of consolidation Auto HSCT -RTC with Dr Irene Limbo with labs in 2 months    All of the patients questions were answered with apparent satisfaction. The patient knows to call the clinic with any problems, questions or concerns.  The total time spent in the appt was 25 minutes and more than 50% was on counseling and direct patient cares.    Sullivan Lone MD [place] AAHIVMS [facility] Hematology/Oncology Physician [facility]  (Office):       [phone] (Work cell):  [phone] (Fax):           [phone]  I, Baldwin [place], am acting as a scribe for Dr. Sullivan Lone.   .I have reviewed the above documentation for accuracy and completeness, and I agree with the  above. .Brunetta Genera MD

## 2018-10-08 ENCOUNTER — Inpatient Hospital Stay: Payer: Self-pay | Attending: Hematology

## 2018-10-08 ENCOUNTER — Inpatient Hospital Stay: Payer: Self-pay

## 2018-10-08 ENCOUNTER — Telehealth: Payer: Self-pay | Admitting: Hematology

## 2018-10-08 ENCOUNTER — Encounter: Payer: Self-pay | Admitting: Hematology

## 2018-10-08 ENCOUNTER — Inpatient Hospital Stay (HOSPITAL_BASED_OUTPATIENT_CLINIC_OR_DEPARTMENT_OTHER): Payer: Self-pay | Admitting: Hematology

## 2018-10-08 VITALS — BP 120/46 | HR 66 | Temp 97.9°F | Resp 18 | Ht 61.81 in | Wt 111.2 lb

## 2018-10-08 DIAGNOSIS — Z86718 Personal history of other venous thrombosis and embolism: Secondary | ICD-10-CM | POA: Insufficient documentation

## 2018-10-08 DIAGNOSIS — E041 Nontoxic single thyroid nodule: Secondary | ICD-10-CM

## 2018-10-08 DIAGNOSIS — D6959 Other secondary thrombocytopenia: Secondary | ICD-10-CM

## 2018-10-08 DIAGNOSIS — F17210 Nicotine dependence, cigarettes, uncomplicated: Secondary | ICD-10-CM | POA: Insufficient documentation

## 2018-10-08 DIAGNOSIS — D630 Anemia in neoplastic disease: Secondary | ICD-10-CM | POA: Insufficient documentation

## 2018-10-08 DIAGNOSIS — C865 Angioimmunoblastic T-cell lymphoma: Secondary | ICD-10-CM | POA: Insufficient documentation

## 2018-10-08 DIAGNOSIS — I10 Essential (primary) hypertension: Secondary | ICD-10-CM | POA: Insufficient documentation

## 2018-10-08 DIAGNOSIS — Z95828 Presence of other vascular implants and grafts: Secondary | ICD-10-CM

## 2018-10-08 DIAGNOSIS — D649 Anemia, unspecified: Secondary | ICD-10-CM

## 2018-10-08 DIAGNOSIS — C8440 Peripheral T-cell lymphoma, not classified, unspecified site: Secondary | ICD-10-CM

## 2018-10-08 LAB — CBC WITH DIFFERENTIAL/PLATELET
Abs Immature Granulocytes: 0.02 10*3/uL (ref 0.00–0.07)
Basophils Absolute: 0 10*3/uL (ref 0.0–0.1)
Basophils Relative: 0 %
EOS ABS: 0.1 10*3/uL (ref 0.0–0.5)
Eosinophils Relative: 2 %
HEMATOCRIT: 32.1 % — AB (ref 36.0–46.0)
Hemoglobin: 10.4 g/dL — ABNORMAL LOW (ref 12.0–15.0)
IMMATURE GRANULOCYTES: 1 %
LYMPHS ABS: 1 10*3/uL (ref 0.7–4.0)
Lymphocytes Relative: 28 %
MCH: 32.1 pg (ref 26.0–34.0)
MCHC: 32.4 g/dL (ref 30.0–36.0)
MCV: 99.1 fL (ref 80.0–100.0)
MONO ABS: 0.5 10*3/uL (ref 0.1–1.0)
MONOS PCT: 15 %
NEUTROS PCT: 54 %
Neutro Abs: 2 10*3/uL (ref 1.7–7.7)
Platelets: 135 10*3/uL — ABNORMAL LOW (ref 150–400)
RBC: 3.24 MIL/uL — ABNORMAL LOW (ref 3.87–5.11)
RDW: 20 % — AB (ref 11.5–15.5)
WBC: 3.7 10*3/uL — ABNORMAL LOW (ref 4.0–10.5)
nRBC: 0 % (ref 0.0–0.2)

## 2018-10-08 LAB — CMP (CANCER CENTER ONLY)
ALBUMIN: 3.6 g/dL (ref 3.5–5.0)
ALK PHOS: 89 U/L (ref 38–126)
ALT: 66 U/L — AB (ref 0–44)
ANION GAP: 7 (ref 5–15)
AST: 37 U/L (ref 15–41)
BUN: 12 mg/dL (ref 8–23)
CALCIUM: 9.5 mg/dL (ref 8.9–10.3)
CO2: 29 mmol/L (ref 22–32)
CREATININE: 0.67 mg/dL (ref 0.44–1.00)
Chloride: 104 mmol/L (ref 98–111)
GFR, Est AFR Am: >60 mL/min (ref >60)
GFR, Estimated: >60 mL/min (ref >60)
GLUCOSE: 176 mg/dL — AB (ref 70–99)
Potassium: 3.7 mmol/L (ref 3.5–5.1)
SODIUM: 140 mmol/L (ref 135–145)
Total Bilirubin: 0.6 mg/dL (ref 0.3–1.2)
Total Protein: 7 g/dL (ref 6.5–8.1)

## 2018-10-08 LAB — SAMPLE TO BLOOD BANK

## 2018-10-08 LAB — LACTATE DEHYDROGENASE: LDH: 173 U/L (ref 98–192)

## 2018-10-08 MED ORDER — HEPARIN SOD (PORK) LOCK FLUSH 100 UNIT/ML IV SOLN
500.0000 [IU] | Freq: Once | INTRAVENOUS | Status: AC | PRN
  Administered 2018-10-08: 500 [IU]
  Filled 2018-10-08: qty 5

## 2018-10-08 MED ORDER — APIXABAN 2.5 MG PO TABS: 2.5000 mg | ORAL_TABLET | Freq: Two times a day (BID) | ORAL | 3 refills | Status: DC

## 2018-10-08 MED ORDER — SODIUM CHLORIDE 0.9% FLUSH
10.0000 mL | INTRAVENOUS | Status: DC | PRN
  Administered 2018-10-08: 10 mL
  Filled 2018-10-08: qty 10

## 2018-10-08 NOTE — Telephone Encounter (Signed)
Printed calendar and avs. °

## 2018-10-08 NOTE — Patient Instructions (Signed)
Thank you for choosing [facility] to provide your oncology and hematology care.  To afford each patient quality time with our providers, please arrive 30 minutes before your scheduled appointment time.  If you arrive late for your appointment, you may be asked to reschedule.  We strive to give you quality time with our providers, and arriving late affects you and other patients whose appointments are after yours.    If you are a no show for multiple scheduled visits, you may be dismissed from the clinic at the providers discretion.     Again, thank you for choosing [facility], our hope is that these requests will decrease the amount of time that you wait before being seen by our physicians.  ______________________________________________________________________   Should you have questions after your visit to the [facility], please contact our office at [phone] between the hours of 8:30 and 4:30 p.m.    Voicemails left after 4:30p.m will not be returned until the following business day.     For prescription refill requests, please have your pharmacy contact us directly.  Please also try to allow 48 hours for prescription requests.     Please contact the scheduling department for questions regarding scheduling.  For scheduling of procedures such as PET scans, CT scans, MRI, Ultrasound, etc please contact central scheduling at [phone].     Resources For Cancer Patients and Caregivers:    Oncolink.org:  A wonderful resource for patients and healthcare providers for information regarding your disease, ways to tract your treatment, what to expect, etc.      American Cancer Society:  800-227-2345  Can help patients locate various types of support and financial assistance   Cancer Care: 1-800-813-HOPE (4673) Provides financial assistance, online support groups, medication/co-pay assistance.     [place] DSS:  336-641-3447 Where to apply  for food stamps, Medicaid, and utility assistance   Medicare Rights Center: 800-333-4114 Helps people with Medicare understand their rights and benefits, navigate the Medicare system, and secure the quality healthcare they deserve   SCAT: 336-333-6589 [place] [employer]'s shared-ride transportation service for eligible riders who have a disability that prevents them from riding the fixed route bus.     For additional information on assistance programs please contact our social worker:   Abigail Elmore:  [phone]  

## 2018-10-10 ENCOUNTER — Telehealth: Payer: Self-pay | Admitting: *Deleted

## 2018-10-10 LAB — MULTIPLE MYELOMA PANEL, SERUM
ALBUMIN SERPL ELPH-MCNC: 3.9 g/dL (ref 2.9–4.4)
Albumin/Glob SerPl: 1.4 (ref 0.7–1.7)
Alpha 1: 0.1 g/dL (ref 0.0–0.4)
Alpha2 Glob SerPl Elph-Mcnc: 0.8 g/dL (ref 0.4–1.0)
B-Globulin SerPl Elph-Mcnc: 0.8 g/dL (ref 0.7–1.3)
Gamma Glob SerPl Elph-Mcnc: 1.1 g/dL (ref 0.4–1.8)
Globulin, Total: 2.8 g/dL (ref 2.2–3.9)
IGA: 356 mg/dL — AB (ref 87–352)
IgG (Immunoglobin G), Serum: 1091 mg/dL (ref 700–1600)
IgM (Immunoglobulin M), Srm: 111 mg/dL (ref 26–217)
TOTAL PROTEIN ELP: 6.7 g/dL (ref 6.0–8.5)

## 2018-10-10 NOTE — Telephone Encounter (Signed)
[facility] Lymphoma Coordinator @ [phone]. Reached VM Ebony Hail). Left message stating Dr. Irene Limbo wants to refer this patient for evaluation of consolidative autologous stem cell transplant with Dr. Grier Mitts office contact information. Requested call back to direct next steps.

## 2018-10-22 ENCOUNTER — Telehealth: Payer: Self-pay | Admitting: *Deleted

## 2018-10-22 NOTE — Telephone Encounter (Signed)
Contacted by [facility] from [employer]. They are not taking self pay patients at this time, so they will not be able to accept the referral at this time. Patient's information will be filed for future reference.McKenzie provided Information that if patient obtained [place], or other insurance, to resubmit referral.

## 2018-10-29 ENCOUNTER — Telehealth: Payer: Self-pay | Admitting: *Deleted

## 2018-10-29 NOTE — Telephone Encounter (Signed)
Contacted patient to advise that [facility] not currently taking self pay patiens. Advised her to call if this changes. Patient verbalized understanding and thanked caller.

## 2018-12-10 NOTE — Progress Notes (Signed)
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 female who has been referred to [place] by Dr Joni Reining for evaluation and management of her newly diagnosed angioimmunoblastic T cell lymphoma.   Patient has a h/o HTN, endometriosis, tobacco use who was admitted to the hospital with shortness of breath and was subsequently found to have diffuse lymphadenopathy. She was also found to have bilateral pleural effusions and has had a thoracentesis with pleural fluid demonstrating atypical lymphocytosis. She was discharged but returned to the ED with complaints of persisting SOB, abdominal swelling, and lower extremity swelling.   She has had a  CTA chest and CT abd/pelvis which showed no PE.but extensive LNadenopathy in the chest and throughout the abd with b/l pleural effusion and mild ascites. Also noted to have narrowing in b/l common iliac arteries.  Clinically she is noted to have b/l LE edema 1-2+  Most recent lab results (05/05/18) of CBC  is as follows: all values are WNL except for RBC at 2.59, HGB at 7.7, HCT at 22.7, RDW at 16.0, PLT at 89k.  She has had a CT bone marrow biopsy on 05/05/2018 -results pending. ECHO was done and showed  Systolic function was   normal. The estimated ejection fraction was in the range of 60%   to 65%. Wall motion was normal; there were no regional wall   motion abnormalities. Left ventricular diastolic function   parameters were normal.  Port a cath placement requested.  Patient was transferred to [facility] with her consent to receive C1 of chemotherapy as inpatient.  Interval History:   Deborah Cooley returns today for management and  evaluation of her Angioimmunoblastic T cell lymphoma after C6 of her treatment. The patient's last visit with [place] was on 10/08/18. She is accompanied today by her partner. The pt reports that she is doing well overall.   The pt reports that she has been enjoying better energy levels since our last visit, has been eating much better, and has gained 9 pounds. She denies any fevers, chills, night sweats, and fatigue. She denies abdominal pains, difficulty breathing, and leg swelling.   The pt notes that she was not able to get transplant consideration from [facility] in the interim as well, due to insurance coverage difficulties and being self-pay. She has been discussing insurance coverage further with our Education officer, museum.  Lab results today (12/11/18) of CBC w/diff and CMP is as follows: all values are WNL except for PLT at 132k, Glucose at 195, ALT at 59. 12/11/18 LDH at 195 12/11/18 MMP is shows no M spike.  On review of systems, pt reports good energy levels, eating well, weight gain, and denies fevers, chills, night sweats, abdominal pains, difficulty breathing, leg swelling, back pain, and any other symptoms.    MEDICAL HISTORY:  Past Medical History:  Diagnosis Date  . Bilateral pleural effusion 04/17/2018  . Endometriosis   . Essential hypertension, benign   . Family history of adverse reaction to anesthesia    "sister stops breathing I think" (04/18/2018)  . Heart murmur   . Mixed hyperlipidemia   . Pneumonia 04/17/2018    SURGICAL HISTORY: Past Surgical History:  Procedure Laterality Date  .  ABDOMINAL HYSTERECTOMY  1970s/1980s   for endometriosis  . Arthroscopic right shoulder surgery    . AXILLARY LYMPH NODE BIOPSY Left 04/21/2018   Procedure: LEFT AXILLARY LYMPH NODE BIOPSY;  Surgeon: Kieth Brightly, Arta Bruce, MD;  Location: [place];  Service: General;  Laterality: Left;  . CARPAL TUNNEL RELEASE Right   . GANGLION CYST EXCISION Right   . IR IMAGING GUIDED PORT INSERTION  05/07/2018  .  MULTIPLE TOOTH EXTRACTIONS    . OOPHORECTOMY  1997  . PARTIAL HYSTERECTOMY  1984  . Right hand surgery    . SHOULDER OPEN ROTATOR CUFF REPAIR Right   . VESICOVAGINAL FISTULA CLOSURE W/ TAH      SOCIAL HISTORY: Social History   Socioeconomic History  . Marital status: Married    Spouse name: Not on file  . Number of children: Not on file  . Years of education: Not on file  . Highest education level: Not on file  Occupational History  . Occupation: Social research officer, government    Comment: works at [employer]  . Financial resource strain: Not on file  . Food insecurity:    Worry: Not on file    Inability: Not on file  . Transportation needs:    Medical: Not on file    Non-medical: Not on file  Tobacco Use  . Smoking status: Current Every Day Smoker    Packs/day: 1.50    Years: 46.00    Pack years: 69.00    Types: Cigarettes  . Smokeless tobacco: Never Used  Substance and Sexual Activity  . Alcohol use: Not Currently    Comment: 04/18/2018 "quit years ago"  . Drug use: Not Currently    Comment: "back in my 32s"  . Sexual activity: Not Currently  Lifestyle  . Physical activity:    Days per week: Not on file    Minutes per session: Not on file  . Stress: Not on file  Relationships  . Social connections:    Talks on phone: Not on file    Gets together: Not on file    Attends religious service: Not on file    Active member of club or organization: Not on file    Attends meetings of clubs or organizations: Not on file    Relationship status: Not on file  . Intimate partner violence:    Fear of current or ex partner: Not on file    Emotionally abused: Not on file    Physically abused: Not on file    Forced sexual activity: Not on file  Other Topics Concern  . Not on file  Social History Narrative   ** Merged History Encounter **       Patient has one son whom is healthy.   Has a live in boyfriend.    FAMILY HISTORY: Family History  Problem Relation  Age of Onset  . Diabetes Mother        age 86  . Heart attack Father        died age 44's  . Hypertension Father   . Cancer Sister        breast cancer diagonsed age 26 years  . Diabetes Unknown   . Heart disease Unknown        Female <55  . Arthritis Unknown   . Asthma Unknown   . Hyperlipidemia Other        several siblings  . Thyroid disease Sister        one sister  with thyroid disease    ALLERGIES:  is allergic to ambien [zolpidem].  MEDICATIONS:  Current Outpatient Medications  Medication Sig Dispense Refill  . acetaminophen (TYLENOL) 325 MG tablet Take 2 tablets (650 mg total) by mouth every 6 (six) hours as needed for mild pain (or Fever >/= 101).    Marland Kitchen apixaban (ELIQUIS) 2.5 MG TABS tablet Take 1 tablet (2.5 mg total) by mouth 2 (two) times daily. (Patient not taking: Reported on 10/08/2018) 60 tablet 3  . lidocaine-prilocaine (EMLA) cream Apply a dime size to port-a-cath 1-2 hours prior to access. Cover with saran wrap. 30 g 2  . LORazepam (ATIVAN) 0.5 MG tablet Take 1 tablet (0.5 mg total) by mouth every 8 (eight) hours as needed for anxiety. (Patient not taking: Reported on 08/11/2018) 30 tablet 0  . Multiple Vitamin (MULTIVITAMIN WITH MINERALS) TABS tablet Take 1 tablet by mouth daily. 30 tablet 0  . ondansetron (ZOFRAN) 8 MG tablet Take 1 tablet (8 mg total) by mouth every 8 (eight) hours as needed for nausea. Start on day 3 after chemotherapy (Patient not taking: Reported on 08/11/2018) 30 tablet 3  . polyethylene glycol (MIRALAX / GLYCOLAX) packet Take 17 g by mouth daily as needed for mild constipation. 30 each 0  . prochlorperazine (COMPAZINE) 10 MG tablet Take 1 tablet (10 mg total) by mouth every 6 (six) hours as needed for nausea or vomiting. (Patient not taking: Reported on 08/11/2018) 30 tablet 1   No current facility-administered medications for this visit.     REVIEW OF SYSTEMS:    A 10+ POINT REVIEW OF SYSTEMS WAS OBTAINED including neurology, dermatology,  psychiatry, cardiac, respiratory, lymph, extremities, GI, GU, Musculoskeletal, constitutional, breasts, reproductive, HEENT.  All pertinent positives are noted in the HPI.  All others are negative.   PHYSICAL EXAMINATION: ECOG PERFORMANCE STATUS: 2 - Symptomatic, <50% confined to bed  VS reviewed in Epic  GENERAL:alert, in no acute distress and comfortable SKIN: no acute rashes, no significant lesions EYES: conjunctiva are pink and non-injected, sclera anicteric OROPHARYNX: MMM, no exudates, no oropharyngeal erythema or ulceration NECK: supple, no JVD LYMPH:  no palpable lymphadenopathy in the cervical, axillary or inguinal regions LUNGS: clear to auscultation b/l with normal respiratory effort HEART: regular rate & rhythm ABDOMEN:  normoactive bowel sounds , non tender, not distended. No palpable hepatosplenomegaly.  Extremity: no pedal edema PSYCH: alert & oriented x 3 with fluent speech NEURO: no focal motor/sensory deficits   LABORATORY DATA:  I have reviewed the data as listed  . CBC Latest Ref Rng & Units 12/11/2018 10/08/2018 09/03/2018  WBC 4.0 - 10.5 K/uL 5.8 3.7(L) 3.7(L)  Hemoglobin 12.0 - 15.0 g/dL 14.2 10.4(L) 7.1(L)  Hematocrit 36.0 - 46.0 % 41.6 32.1(L) 20.7(L)  Platelets 150 - 400 K/uL 132(L) 135(L) 117(L)  ANC 700  CBC    Component Value Date/Time   WBC 5.8 12/11/2018 0948   RBC 4.47 12/11/2018 0948   HGB 14.2 12/11/2018 0948   HGB 8.5 (L) 08/20/2018 0741   HCT 41.6 12/11/2018 0948   PLT 132 (L) 12/11/2018 0948   PLT 195 08/20/2018 0741   MCV 93.1 12/11/2018 0948   MCH 31.8 12/11/2018 0948   MCHC 34.1 12/11/2018 0948   RDW 12.4 12/11/2018 0948   LYMPHSABS 1.6 12/11/2018 0948   MONOABS 0.5 12/11/2018 0948   EOSABS 0.1 12/11/2018 0948   BASOSABS 0.0 12/11/2018 0948    . CMP Latest Ref Rng & Units 12/11/2018 10/08/2018 08/29/2018  Glucose 70 - 99  mg/dL 195(H) 176(H) 143(H)  BUN 8 - 23 mg/dL _0 Creatinine 0.44 - 1.00 mg/dL 0.73 0.67 0.63    Sodium 135 - 145 mmol/L 140 140 134(L)  Potassium 3.5 - 5.1 mmol/L 4.2 3.7 4.3  Chloride 98 - 111 mmol/L 103 104 98  CO2 22 - 32 mmol/L _1 Calcium 8.9 - 10.3 mg/dL 9.7 9.5 10.2  Total Protein 6.5 - 8.1 g/dL 7.1 7.0 6.4(L)  Total Bilirubin 0.3 - 1.2 mg/dL 0.3 0.6 0.7  Alkaline Phos 38 - 126 U/L 88 89 88  AST 15 - 41 U/L 32 37 17  ALT 0 - 44 U/L 59(H) 66(H) 22    . Lab Results  Component Value Date   LDH 195 (H) 12/11/2018      Component     Latest Ref Rng & Units 04/18/2018 05/03/2018  Hepatitis B Surface Ag     Negative  Negative  HCV Ab     0.0 - 0.9 s/co ratio  <0.1  Hep A Ab, IgM     Negative  Negative  Hep B Core Ab, IgM     Negative  Negative  HIV Screen 4th Generation wRfx     Non Reactive Non Reactive     04/21/18 Molecular Pathology:   04/21/18 Surgical Pathology:    RADIOGRAPHIC STUDIES: I have personally reviewed the radiological images as listed and agreed with the findings in the report. No results found.  ASSESSMENT & PLAN:   63 y.o. female with  1. 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 S/P C1 OF CHOP on 05/08/2018 S/p C2 of Brentuximab-CHP on 05/28/2018  S/p C3 of Brentuximab-CHP on 06/18/2018 S/p C4 of Brentuximab-CHP on  07/09/2018 S/p C5 of Brentuximab-CHP on  07/30/2018   07/01/18 PET/CT revealed Resolution of axillary, mediastinal, periaortic, and inguinal lymphadenopathy. ( [place] 1) 2. Decrease in size of spleen.  Normal metabolic activity. 3. Uniform increase in marrow metabolic activity is most consistent with GCSF type response.   09/24/18 PET/CT revealed No evidence of residual or recurrent hypermetabolic lymphoma. ([place] 1). 2. 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 Recommend assessment of coronary risk factors and consideration of medical therapy. 3. Aortic atherosclerosis and emphysema.  Discussed the option to pursue BM transplant after  completing treatment and that I would be happy to refer the pt to [facility] for a discussion with a transplant team - this was done but not possible due to lack of insurance. Then referred pt to [facility], and this also was not possible due to lack of insurance.   2. General LNadenopathy from lymphoma with b/l pleural effusion and b/l lower extremity weakness- resolved  3. Anemia/thrombocytopenia - from Bone marrow involvement by lymphoma + Chemotherapy BM Bx confirms involved by T cell lymphoma. Anemia now resolved.  4. 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 LDH wnl no evidnece of overt hemolysis at this timne   5. CMV IgM+ --  Component     Latest Ref Rng & Units 05/09/2018  CMV Quant DNA PCR (Plasma)     Negative IU/mL 8,430  Log10 CMV Qn DNA Pl     log10 IU/mL 3.926    7. Port a cath in situ  8. 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 S/p Acute hypoxic respiratory failure - required intubation --now extubated and with minimal oxygen needs. Though  the rapidity of presentation is unusual would need to consider HCAP and CMV pneumonitis (CMV IgM+ and PCR positive). Resolved hypoxia - treated with antibiotics for aspiration pneumonia.  10 . RUE DVT - US 6/25 -- was briefly on IV Heparin but held given thrombocytopenia -- currently on Eliquis  PLAN: -Discussed pt labwork today, 12/11/18; WBC normalized to 5.8k, HGB normalized to 14.2, PLT stable at 132k, LDH at 195 -12/11/18 MMP is shows no M spike -Discussed that in the absence of transplant, there is a higher possibility of her lymphoma returning. She would still prefer to have her port removed -Will refer the pt to IR for port placement -Again stressed the importance of the pt establishing care with a PCP. -Did refer pt to Social Work (has contact information) for continued aid in getting insurance towards being able to qualify for auto  HSCT. -Recommend annual flu vaccine and every 5 year pneumonia vaccines which the pt does not want at this time  -Recommended that the pt continue to eat well, drink at least 48-64 oz of water each day, and walk 20-30 minutes each day.  -Will see the pt back in 3 months     Port a cath removal in 1 week RTC with Dr Irene Limbo with labs in 3 months    All of the patients questions were answered with apparent satisfaction. The patient knows to call the clinic with any problems, questions or concerns.  The total time spent in the appt was 25 minutes and more than 50% was on counseling and direct patient cares.    Sullivan Lone MD [place] AAHIVMS [facility] Hematology/Oncology Physician [facility]  (Office):       [phone] (Work cell):  [phone] (Fax):           [phone]  I, Baldwin Jamaica, am acting as a scribe for Dr. Sullivan Lone.   .I have reviewed the above documentation for accuracy and completeness, and I agree with the above. Brunetta Genera MD

## 2018-12-11 ENCOUNTER — Telehealth: Payer: Self-pay

## 2018-12-11 ENCOUNTER — Inpatient Hospital Stay (HOSPITAL_BASED_OUTPATIENT_CLINIC_OR_DEPARTMENT_OTHER): Payer: Self-pay | Admitting: Hematology

## 2018-12-11 ENCOUNTER — Inpatient Hospital Stay: Payer: Self-pay | Attending: Hematology

## 2018-12-11 VITALS — BP 118/56 | HR 73 | Temp 97.7°F | Resp 18 | Ht 61.81 in | Wt 120.8 lb

## 2018-12-11 DIAGNOSIS — C865 Angioimmunoblastic T-cell lymphoma: Secondary | ICD-10-CM

## 2018-12-11 DIAGNOSIS — F17210 Nicotine dependence, cigarettes, uncomplicated: Secondary | ICD-10-CM

## 2018-12-11 DIAGNOSIS — Z7901 Long term (current) use of anticoagulants: Secondary | ICD-10-CM

## 2018-12-11 DIAGNOSIS — D630 Anemia in neoplastic disease: Secondary | ICD-10-CM

## 2018-12-11 DIAGNOSIS — C8440 Peripheral T-cell lymphoma, not classified, unspecified site: Secondary | ICD-10-CM

## 2018-12-11 DIAGNOSIS — Z86718 Personal history of other venous thrombosis and embolism: Secondary | ICD-10-CM | POA: Insufficient documentation

## 2018-12-11 DIAGNOSIS — I10 Essential (primary) hypertension: Secondary | ICD-10-CM

## 2018-12-11 DIAGNOSIS — E782 Mixed hyperlipidemia: Secondary | ICD-10-CM

## 2018-12-11 LAB — CBC WITH DIFFERENTIAL/PLATELET
Abs Immature Granulocytes: 0.02 10*3/uL (ref 0.00–0.07)
BASOS ABS: 0 10*3/uL (ref 0.0–0.1)
BASOS PCT: 0 %
EOS PCT: 1 %
Eosinophils Absolute: 0.1 10*3/uL (ref 0.0–0.5)
HCT: 41.6 % (ref 36.0–46.0)
Hemoglobin: 14.2 g/dL (ref 12.0–15.0)
Immature Granulocytes: 0 %
Lymphocytes Relative: 28 %
Lymphs Abs: 1.6 10*3/uL (ref 0.7–4.0)
MCH: 31.8 pg (ref 26.0–34.0)
MCHC: 34.1 g/dL (ref 30.0–36.0)
MCV: 93.1 fL (ref 80.0–100.0)
MONO ABS: 0.5 10*3/uL (ref 0.1–1.0)
Monocytes Relative: 9 %
NRBC: 0 % (ref 0.0–0.2)
Neutro Abs: 3.6 10*3/uL (ref 1.7–7.7)
Neutrophils Relative %: 62 %
PLATELETS: 132 10*3/uL — AB (ref 150–400)
RBC: 4.47 MIL/uL (ref 3.87–5.11)
RDW: 12.4 % (ref 11.5–15.5)
WBC: 5.8 10*3/uL (ref 4.0–10.5)

## 2018-12-11 LAB — CMP (CANCER CENTER ONLY)
ALT: 59 U/L — AB (ref 0–44)
AST: 32 U/L (ref 15–41)
Albumin: 3.9 g/dL (ref 3.5–5.0)
Alkaline Phosphatase: 88 U/L (ref 38–126)
Anion gap: 9 (ref 5–15)
BILIRUBIN TOTAL: 0.3 mg/dL (ref 0.3–1.2)
BUN: 10 mg/dL (ref 8–23)
CO2: 28 mmol/L (ref 22–32)
CREATININE: 0.73 mg/dL (ref 0.44–1.00)
Calcium: 9.7 mg/dL (ref 8.9–10.3)
Chloride: 103 mmol/L (ref 98–111)
Glucose, Bld: 195 mg/dL — ABNORMAL HIGH (ref 70–99)
Potassium: 4.2 mmol/L (ref 3.5–5.1)
Sodium: 140 mmol/L (ref 135–145)
TOTAL PROTEIN: 7.1 g/dL (ref 6.5–8.1)

## 2018-12-11 LAB — LACTATE DEHYDROGENASE: LDH: 195 U/L — AB (ref 98–192)

## 2018-12-11 NOTE — Telephone Encounter (Signed)
Printed avs and calender of upcoming appointment. Per 12/3 los 

## 2018-12-14 LAB — MULTIPLE MYELOMA PANEL, SERUM
ALBUMIN/GLOB SERPL: 1.5 (ref 0.7–1.7)
ALPHA 1: 0.1 g/dL (ref 0.0–0.4)
Albumin SerPl Elph-Mcnc: 3.7 g/dL (ref 2.9–4.4)
Alpha2 Glob SerPl Elph-Mcnc: 0.7 g/dL (ref 0.4–1.0)
B-Globulin SerPl Elph-Mcnc: 0.9 g/dL (ref 0.7–1.3)
Gamma Glob SerPl Elph-Mcnc: 0.8 g/dL (ref 0.4–1.8)
Globulin, Total: 2.5 g/dL (ref 2.2–3.9)
IGA: 271 mg/dL (ref 87–352)
IGM (IMMUNOGLOBULIN M), SRM: 90 mg/dL (ref 26–217)
IgG (Immunoglobin G), Serum: 968 mg/dL (ref 700–1600)
TOTAL PROTEIN ELP: 6.2 g/dL (ref 6.0–8.5)

## 2018-12-23 ENCOUNTER — Ambulatory Visit (HOSPITAL_COMMUNITY)
Admission: RE | Admit: 2018-12-23 | Discharge: 2018-12-23 | Disposition: A | Discharge status: Home / Self Care | Payer: Self-pay | Source: Ambulatory Visit | Attending: Hematology | Admitting: Hematology

## 2018-12-23 ENCOUNTER — Encounter (HOSPITAL_COMMUNITY): Payer: Self-pay

## 2018-12-23 ENCOUNTER — Other Ambulatory Visit: Payer: Self-pay

## 2018-12-23 DIAGNOSIS — Z79899 Other long term (current) drug therapy: Secondary | ICD-10-CM | POA: Insufficient documentation

## 2018-12-23 DIAGNOSIS — Z888 Allergy status to other drugs, medicaments and biological substances status: Secondary | ICD-10-CM | POA: Insufficient documentation

## 2018-12-23 DIAGNOSIS — E782 Mixed hyperlipidemia: Secondary | ICD-10-CM | POA: Insufficient documentation

## 2018-12-23 DIAGNOSIS — I10 Essential (primary) hypertension: Secondary | ICD-10-CM | POA: Insufficient documentation

## 2018-12-23 DIAGNOSIS — Z90711 Acquired absence of uterus with remaining cervical stump: Secondary | ICD-10-CM | POA: Insufficient documentation

## 2018-12-23 DIAGNOSIS — F17210 Nicotine dependence, cigarettes, uncomplicated: Secondary | ICD-10-CM | POA: Insufficient documentation

## 2018-12-23 DIAGNOSIS — Z803 Family history of malignant neoplasm of breast: Secondary | ICD-10-CM | POA: Insufficient documentation

## 2018-12-23 DIAGNOSIS — C8440 Peripheral T-cell lymphoma, not classified, unspecified site: Secondary | ICD-10-CM | POA: Insufficient documentation

## 2018-12-23 DIAGNOSIS — Z90710 Acquired absence of both cervix and uterus: Secondary | ICD-10-CM | POA: Insufficient documentation

## 2018-12-23 DIAGNOSIS — Z8249 Family history of ischemic heart disease and other diseases of the circulatory system: Secondary | ICD-10-CM | POA: Insufficient documentation

## 2018-12-23 DIAGNOSIS — Z452 Encounter for adjustment and management of vascular access device: Secondary | ICD-10-CM | POA: Insufficient documentation

## 2018-12-23 HISTORY — PX: IR REMOVAL TUN ACCESS W/ PORT W/O FL MOD SED: IMG2290

## 2018-12-23 LAB — CBC
HCT: 43.1 % (ref 36.0–46.0)
HEMOGLOBIN: 14.5 g/dL (ref 12.0–15.0)
MCH: 32 pg (ref 26.0–34.0)
MCHC: 33.6 g/dL (ref 30.0–36.0)
MCV: 95.1 fL (ref 80.0–100.0)
Platelets: 146 10*3/uL — ABNORMAL LOW (ref 150–400)
RBC: 4.53 MIL/uL (ref 3.87–5.11)
RDW: 12.6 % (ref 11.5–15.5)
WBC: 6.9 10*3/uL (ref 4.0–10.5)
nRBC: 0 % (ref 0.0–0.2)

## 2018-12-23 LAB — PROTIME-INR
INR: 0.92
Prothrombin Time: 12.3 seconds (ref 11.4–15.2)

## 2018-12-23 MED ORDER — FENTANYL CITRATE (PF) 100 MCG/2ML IJ SOLN
INTRAMUSCULAR | Status: AC
  Filled 2018-12-23: qty 2

## 2018-12-23 MED ORDER — CEFAZOLIN SODIUM-DEXTROSE 2-4 GM/100ML-% IV SOLN
2.0000 g | Freq: Once | INTRAVENOUS | Status: AC
  Administered 2018-12-23: 2 g via INTRAVENOUS

## 2018-12-23 MED ORDER — MIDAZOLAM HCL 2 MG/2ML IJ SOLN
INTRAMUSCULAR | Status: AC
  Filled 2018-12-23: qty 2

## 2018-12-23 MED ORDER — LIDOCAINE HCL (PF) 1 % IJ SOLN
INTRAMUSCULAR | Status: DC | PRN
  Administered 2018-12-23: 10 mL via SUBCUTANEOUS

## 2018-12-23 MED ORDER — MIDAZOLAM HCL 2 MG/2ML IJ SOLN
INTRAMUSCULAR | Status: DC | PRN
  Administered 2018-12-23 (×2): 1 mg via INTRAVENOUS

## 2018-12-23 MED ORDER — LIDOCAINE HCL 1 % IJ SOLN
INTRAMUSCULAR | Status: AC
  Filled 2018-12-23: qty 20

## 2018-12-23 MED ORDER — FENTANYL CITRATE (PF) 100 MCG/2ML IJ SOLN
INTRAMUSCULAR | Status: DC | PRN
  Administered 2018-12-23 (×2): 50 ug via INTRAVENOUS

## 2018-12-23 MED ORDER — SODIUM CHLORIDE 0.9 % IV SOLN
INTRAVENOUS | Status: DC
  Administered 2018-12-23: 11:00:00 via INTRAVENOUS

## 2018-12-23 MED ORDER — CEFAZOLIN SODIUM-DEXTROSE 2-4 GM/100ML-% IV SOLN
INTRAVENOUS | Status: AC
  Administered 2018-12-23: 2 g via INTRAVENOUS
  Filled 2018-12-23: qty 100

## 2018-12-23 NOTE — H&P (Signed)
Chief Complaint: Patient was seen in consultation today for port removal at the request of Brunetta Genera  Referring Physician(s): Brunetta Genera  Supervising Physician: Marybelle Killings  Patient Status: [facility] - Out-pt  History of Present Illness: Deborah Cooley is a 63 y.o. female with hx of lymphoma. She had port placed in 04/2018 and has completed treatment. She is now referred for port removal. PMHx, meds, labs, imaging, allergies reviewed. Feels well, no recent fevers, chills, illness. Has been NPO today as directed. Family at bedside.   Past Medical History:  Diagnosis Date  . Bilateral pleural effusion 04/17/2018  . Endometriosis   . Essential hypertension, benign   . Family history of adverse reaction to anesthesia    "sister stops breathing I think" (04/18/2018)  . Heart murmur   . Mixed hyperlipidemia   . Pneumonia 04/17/2018    Past Surgical History:  Procedure Laterality Date  . ABDOMINAL HYSTERECTOMY  1970s/1980s   for endometriosis  . Arthroscopic right shoulder surgery    . AXILLARY LYMPH NODE BIOPSY Left 04/21/2018   Procedure: LEFT AXILLARY LYMPH NODE BIOPSY;  Surgeon: Kieth Brightly, Arta Bruce, MD;  Location: [place];  Service: General;  Laterality: Left;  . CARPAL TUNNEL RELEASE Right   . GANGLION CYST EXCISION Right   . IR IMAGING GUIDED PORT INSERTION  05/07/2018  . MULTIPLE TOOTH EXTRACTIONS    . OOPHORECTOMY  1997  . PARTIAL HYSTERECTOMY  1984  . Right hand surgery    . SHOULDER OPEN ROTATOR CUFF REPAIR Right   . VESICOVAGINAL FISTULA CLOSURE W/ TAH      Allergies: Ambien [zolpidem]  Medications:  Current Outpatient Medications:  .  acetaminophen (TYLENOL) 325 MG tablet, Take 2 tablets (650 mg total) by mouth every 6 (six) hours as needed for mild pain (or Fever >/= 101)., Disp: , Rfl:  .  lidocaine-prilocaine (EMLA) cream, Apply a dime size to port-a-cath 1-2 hours prior to access. Cover with saran wrap., Disp: 30 g, Rfl: 2 .   LORazepam (ATIVAN) 0.5 MG tablet, Take 1 tablet (0.5 mg total) by mouth every 8 (eight) hours as needed for anxiety. (Patient not taking: Reported on 08/11/2018), Disp: 30 tablet, Rfl: 0 .  Multiple Vitamin (MULTIVITAMIN WITH MINERALS) TABS tablet, Take 1 tablet by mouth daily., Disp: 30 tablet, Rfl: 0 .  ondansetron (ZOFRAN) 8 MG tablet, Take 1 tablet (8 mg total) by mouth every 8 (eight) hours as needed for nausea. Start on day 3 after chemotherapy (Patient not taking: Reported on 08/11/2018), Disp: 30 tablet, Rfl: 3 .  polyethylene glycol (MIRALAX / GLYCOLAX) packet, Take 17 g by mouth daily as needed for mild constipation., Disp: 30 each, Rfl: 0 .  prochlorperazine (COMPAZINE) 10 MG tablet, Take 1 tablet (10 mg total) by mouth every 6 (six) hours as needed for nausea or vomiting. (Patient not taking: Reported on 08/11/2018), Disp: 30 tablet, Rfl: 1  Current Facility-Administered Medications:  .  ceFAZolin (ANCEF) IVPB 2g/100 mL premix, 2 g, Intravenous, Once, Kevan Prouty, Lennette Bihari, PA-C    Family History  Problem Relation Age of Onset  . Diabetes Mother        age 35  . Heart attack Father        died age 96's  . Hypertension Father   . Cancer Sister        breast cancer diagonsed age 63 years  . Diabetes Other   . Heart disease Other        Female <55  . Arthritis  Other   . Asthma Other   . Hyperlipidemia Other        several siblings  . Thyroid disease Sister        one sister with thyroid disease    Social History   Socioeconomic History  . Marital status: Married    Spouse name: Not on file  . Number of children: Not on file  . Years of education: Not on file  . Highest education level: Not on file  Occupational History  . Occupation: Social research officer, government    Comment: works at [employer]  . Financial resource strain: Not on file  . Food insecurity:    Worry: Not on file    Inability: Not on file  . Transportation needs:    Medical: Not on file     Non-medical: Not on file  Tobacco Use  . Smoking status: Current Every Day Smoker    Packs/day: 1.50    Years: 46.00    Pack years: 69.00    Types: Cigarettes  . Smokeless tobacco: Never Used  Substance and Sexual Activity  . Alcohol use: Not Currently    Comment: 04/18/2018 "quit years ago"  . Drug use: Not Currently    Comment: "back in my 33s"  . Sexual activity: Not Currently  Lifestyle  . Physical activity:    Days per week: Not on file    Minutes per session: Not on file  . Stress: Not on file  Relationships  . Social connections:    Talks on phone: Not on file    Gets together: Not on file    Attends religious service: Not on file    Active member of club or organization: Not on file    Attends meetings of clubs or organizations: Not on file    Relationship status: Not on file  Other Topics Concern  . Not on file  Social History Narrative   ** Merged History Encounter **       Patient has one son whom is healthy.   Has a live in boyfriend.      Review of Systems: A 12 point ROS discussed and pertinent positives are indicated in the HPI above.  All other systems are negative.  Review of Systems  Vital Signs: BP 120/66   Pulse 87   Temp 97.7 F (36.5 C) (Oral)   Resp 16   SpO2 97%   Physical Exam Constitutional:      Appearance: Normal appearance.  HENT:     Mouth/Throat:     Mouth: Mucous membranes are moist.     Pharynx: Oropharynx is clear.  Cardiovascular:     Rate and Rhythm: Normal rate and regular rhythm.     Heart sounds: Normal heart sounds.  Pulmonary:     Effort: Pulmonary effort is normal. No respiratory distress.     Breath sounds: Normal breath sounds.  Skin:    General: Skin is warm and dry.     Comments: (R)chest port intact, site well healed  Neurological:     General: No focal deficit present.     Mental Status: She is alert and oriented to person, place, and time.  Psychiatric:        Mood and Affect: Mood normal.         Judgment: Judgment normal.     Imaging: No results found.  Labs:  CBC: Recent Labs    08/29/18 0805 09/03/18 0815 10/08/18 0947 12/11/18 0948  WBC  1.2* 3.7* 3.7* 5.8  HGB 5.1* 7.1* 10.4* 14.2  HCT 14.7* 20.7* 32.1* 41.6  PLT 27* 117* 135* 132*    COAGS: Recent Labs    04/21/18 0625 05/03/18 1407 05/12/18 0500  INR 1.10 1.03 1.13    BMP: Recent Labs    08/20/18 0741 08/29/18 0805 10/08/18 0947 12/11/18 0948  NA 139 134* 140 140  K 4.5 4.3 3.7 4.2  CL 101 98 104 103  CO2 27 26 29 28   GLUCOSE 133* 143* 176* 195*  BUN 6* 13 12 10   CALCIUM 9.8 10.2 9.5 9.7  CREATININE 0.66 0.63 0.67 0.73  GFRNONAA >60 >60 >60 >60  GFRAA >60 >60 >60 >60    LIVER FUNCTION TESTS: Recent Labs    08/20/18 0741 08/29/18 0805 10/08/18 0947 12/11/18 0948  BILITOT 0.5 0.7 0.6 0.3  AST 23 17 37 32  ALT 21 22 66* 59*  ALKPHOS 81 88 89 88  PROT 7.0 6.4* 7.0 7.1  ALBUMIN 3.3* 3.3* 3.6 3.9    TUMOR MARKERS: No results for input(s): AFPTM, CEA, CA199, CHROMGRNA in the last 8760 hours.  Assessment and Plan: Lymphoma, treatment complete. For port removal. Labs pending Risks and benefits of port removal was discussed with the patient including, but not limited to bleeding, infection, pneumothorax, or fibrin sheath development and need for additional procedures.  All of the patient's questions were answered, patient is agreeable to proceed. Consent signed and in chart.    Thank you for this interesting consult.  I greatly enjoyed meeting Deborah Cooley and look forward to participating in their care.  A copy of this report was sent to the requesting provider on this date.  Electronically Signed: Ascencion Dike, PA-C 12/23/2018, 10:41 AM   I spent a total of 20 minutes in face to face in clinical consultation, greater than 50% of which was counseling/coordinating care for port removal

## 2018-12-23 NOTE — Discharge Instructions (Signed)
Keep area covered.   Implanted Port Removal, Care After This sheet gives you information about how to care for yourself after your procedure. Your health care provider may also give you more specific instructions. If you have problems or questions, contact your health care provider. What can I expect after the procedure? After the procedure, it is common to have:  Soreness or pain near your incision.  Some swelling or bruising near your incision. Follow these instructions at home: Medicines  Take over-the-counter and prescription medicines only as told by your health care provider.  If you were prescribed an antibiotic medicine, take it as told by your health care provider. Do not stop taking the antibiotic even if you start to feel better. Bathing  Do not take baths, swim, or use a hot tub until your health care provider approves. Ask your health care provider if you can take showers. You may only be allowed to take sponge baths. Incision care   Follow instructions from your health care provider about how to take care of your incision. Make sure you: ? Wash your hands with soap and water before you change your bandage (dressing). If soap and water are not available, use hand sanitizer. ? Change your dressing as told by your health care provider. ? Keep your dressing dry. ? Leave stitches (sutures), skin glue, or adhesive strips in place. These skin closures may need to stay in place for 2 weeks or longer. If adhesive strip edges start to loosen and curl up, you may trim the loose edges. Do not remove adhesive strips completely unless your health care provider tells you to do that.  Check your incision area every day for signs of infection. Check for: ? More redness, swelling, or pain. ? More fluid or blood. ? Warmth. ? Pus or a bad smell. Driving   Do not drive for 24 hours if you were given a medicine to help you relax (sedative) during your procedure.  If you did not receive  a sedative, ask your health care provider when it is safe to drive. Activity  Return to your normal activities as told by your health care provider. Ask your health care provider what activities are safe for you.  Do not lift anything that is heavier than 10 lb (4.5 kg), or the limit that you are told, until your health care provider says that it is safe.  Do not do activities that involve lifting your arms over your head. General instructions  Do not use any products that contain nicotine or tobacco, such as cigarettes and e-cigarettes. These can delay healing. If you need help quitting, ask your health care provider.  Keep all follow-up visits as told by your health care provider. This is important. Contact a health care provider if:  You have more redness, swelling, or pain around your incision.  You have more fluid or blood coming from your incision.  Your incision feels warm to the touch.  You have pus or a bad smell coming from your incision.  You have pain that is not relieved by your pain medicine. Get help right away if you have:  A fever or chills.  Chest pain.  Difficulty breathing. Summary  After the procedure, it is common to have pain, soreness, swelling, or bruising near your incision.  If you were prescribed an antibiotic medicine, take it as told by your health care provider. Do not stop taking the antibiotic even if you start to feel better.  Do  not drive for 24 hours if you were given a sedative during your procedure.  Return to your normal activities as told by your health care provider. Ask your health care provider what activities are safe for you. This information is not intended to replace advice given to you by your health care provider. Make sure you discuss any questions you have with your health care provider. Document Released: 10/17/2015 Document Revised: 12/19/2017 Document Reviewed: 12/19/2017 Elsevier Interactive Patient Education  2019  [employer].    Moderate Conscious Sedation, Adult, Care After These instructions provide you with information about caring for yourself after your procedure. Your health care provider may also give you more specific instructions. Your treatment has been planned according to current medical practices, but problems sometimes occur. Call your health care provider if you have any problems or questions after your procedure. What can I expect after the procedure? After your procedure, it is common:  To feel sleepy for several hours.  To feel clumsy and have poor balance for several hours.  To have poor judgment for several hours.  To vomit if you eat too soon. Follow these instructions at home: For at least 24 hours after the procedure:   Do not: ? Participate in activities where you could fall or become injured. ? Drive. ? Use heavy machinery. ? Drink alcohol. ? Take sleeping pills or medicines that cause drowsiness. ? Make important decisions or sign legal documents. ? Take care of children on your own.  Rest. Eating and drinking  Follow the diet recommended by your health care provider.  If you vomit: ? Drink water, juice, or soup when you can drink without vomiting. ? Make sure you have little or no nausea before eating solid foods. General instructions  Have a responsible adult stay with you until you are awake and alert.  Take over-the-counter and prescription medicines only as told by your health care provider.  If you smoke, do not smoke without supervision.  Keep all follow-up visits as told by your health care provider. This is important. Contact a health care provider if:  You keep feeling nauseous or you keep vomiting.  You feel light-headed.  You develop a rash.  You have a fever. Get help right away if:  You have trouble breathing. This information is not intended to replace advice given to you by your health care provider. Make sure you discuss any  questions you have with your health care provider. Document Released: 08/26/2013 Document Revised: 04/09/2016 Document Reviewed: 02/25/2016 Elsevier Interactive Patient Education  2019 [employer].

## 2018-12-23 NOTE — Procedures (Signed)
RIJV PAC removal EBL 0 Comp 0 

## 2019-02-18 ENCOUNTER — Encounter: Payer: Self-pay | Admitting: General Practice

## 2019-02-18 NOTE — Progress Notes (Signed)
[place] Team contacted patient to assess for food insecurity and other psychosocial needs during current COVID19 pandemic.  Unable to reach patient, unable to leave VM.   Beverely Pace, [place]

## 2019-03-11 NOTE — Progress Notes (Signed)
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 female who has been referred to [place] by Dr Joni Reining for evaluation and management of her newly diagnosed angioimmunoblastic T cell lymphoma.   Patient has a h/o HTN, endometriosis, tobacco use who was admitted to the hospital with shortness of breath and was subsequently found to have diffuse lymphadenopathy. She was also found to have bilateral pleural effusions and has had a thoracentesis with pleural fluid demonstrating atypical lymphocytosis. She was discharged but returned to the ED with complaints of persisting SOB, abdominal swelling, and lower extremity swelling.   She has had a  CTA chest and CT abd/pelvis which showed no PE.but extensive LNadenopathy in the chest and throughout the abd with b/l pleural effusion and mild ascites. Also noted to have narrowing in b/l common iliac arteries.  Clinically she is noted to have b/l LE edema 1-2+  Most recent lab results (05/05/18) of CBC  is as follows: all values are WNL except for RBC at 2.59, HGB at 7.7, HCT at 22.7, RDW at 16.0, PLT at 89k.  She has had a CT bone marrow biopsy on 05/05/2018 -results pending. ECHO was done and showed  Systolic function was   normal. The estimated ejection fraction was in the range of 60%   to 65%. Wall motion was normal; there were no regional wall   motion abnormalities. Left ventricular diastolic function   parameters were normal.  Port a cath placement requested.  Patient was transferred to [facility] with her consent to receive C1 of chemotherapy as inpatient.  Interval History:   Deborah Cooley returns today for management and  evaluation of her Angioimmunoblastic T cell lymphoma after C6 of her treatment. The patient's last visit with [place] was on 12/11/18. The pt reports that she is doing well overall.  The pt reports that she has not developed any new concerns since her last visit. She notes that she feels "the way she did before she got sick." She notes that she is enjoying good energy levels, is eating well, is sleeping well, and endorses moving her bowels well. She denies any SOB, leg swelling, abdominal pains or CP. The pt has gained some weight since her last visit.   Lab results today (03/12/19) of CBC w/diff and CMP is as follows: all values are WNL except for PLT at 132k, Glucose at 226, ALT at 62. 03/12/19 LDH is pending  On review of systems, pt reports eating well, good energy levels, sleeping well, moving her bowels well, weight gain, and denies leg swelling, abdominal pains, SOB, CP, and any other symptoms.   MEDICAL HISTORY:  Past Medical History:  Diagnosis Date  . Bilateral pleural effusion 04/17/2018  . Endometriosis   . Essential hypertension, benign   . Family history of adverse reaction to anesthesia    "sister stops breathing I think" (04/18/2018)  . Heart murmur   . Mixed hyperlipidemia   . Pneumonia 04/17/2018    SURGICAL HISTORY: Past Surgical History:  Procedure Laterality Date  . ABDOMINAL HYSTERECTOMY  1970s/1980s   for endometriosis  . Arthroscopic right shoulder surgery    . AXILLARY LYMPH NODE BIOPSY Left 04/21/2018   Procedure: LEFT AXILLARY  LYMPH NODE BIOPSY;  Surgeon: Kinsinger, Arta Bruce, MD;  Location: [facility];  Service: General;  Laterality: Left;  . CARPAL TUNNEL RELEASE Right   . GANGLION CYST EXCISION Right   . IR IMAGING GUIDED PORT INSERTION  05/07/2018  . IR REMOVAL TUN ACCESS W/ PORT W/O FL MOD SED  12/23/2018  . MULTIPLE TOOTH EXTRACTIONS    . OOPHORECTOMY  1997  . PARTIAL HYSTERECTOMY  1984  . Right hand surgery    . SHOULDER OPEN ROTATOR CUFF REPAIR Right   .  VESICOVAGINAL FISTULA CLOSURE W/ TAH      SOCIAL HISTORY: Social History   Socioeconomic History  . Marital status: Married    Spouse name: Not on file  . Number of children: Not on file  . Years of education: Not on file  . Highest education level: Not on file  Occupational History  . Occupation: Social research officer, government    Comment: works at [employer]  . Financial resource strain: Not on file  . Food insecurity:    Worry: Not on file    Inability: Not on file  . Transportation needs:    Medical: Not on file    Non-medical: Not on file  Tobacco Use  . Smoking status: Current Every Day Smoker    Packs/day: 1.50    Years: 46.00    Pack years: 69.00    Types: Cigarettes  . Smokeless tobacco: Never Used  Substance and Sexual Activity  . Alcohol use: Not Currently    Comment: 04/18/2018 "quit years ago"  . Drug use: Not Currently    Comment: "back in my 18s"  . Sexual activity: Not Currently  Lifestyle  . Physical activity:    Days per week: Not on file    Minutes per session: Not on file  . Stress: Not on file  Relationships  . Social connections:    Talks on phone: Not on file    Gets together: Not on file    Attends religious service: Not on file    Active member of club or organization: Not on file    Attends meetings of clubs or organizations: Not on file    Relationship status: Not on file  . Intimate partner violence:    Fear of current or ex partner: Not on file    Emotionally abused: Not on file    Physically abused: Not on file    Forced sexual activity: Not on file  Other Topics Concern  . Not on file  Social History Narrative   ** Merged History Encounter **       Patient has one son whom is healthy.   Has a live in boyfriend.    FAMILY HISTORY: Family History  Problem Relation Age of Onset  . Diabetes Mother        age 43  . Heart attack Father        died age 78's  . Hypertension Father   . Cancer Sister        breast  cancer diagonsed age 37 years  . Diabetes Other   . Heart disease Other        Female <55  . Arthritis Other   . Asthma Other   . Hyperlipidemia Other        several siblings  . Thyroid disease Sister        one sister with thyroid disease    ALLERGIES:  is allergic to [place] [zolpidem].  MEDICATIONS:  Current Outpatient Medications  Medication Sig Dispense Refill  . acetaminophen (TYLENOL) 325 MG tablet Take 2 tablets (650 mg total) by mouth every 6 (six) hours as needed for mild pain (or Fever >/= 101).    Marland Kitchen lidocaine-prilocaine (EMLA) cream Apply a dime size to port-a-cath 1-2 hours prior to access. Cover with saran wrap. 30 g 2  . LORazepam (ATIVAN) 0.5 MG tablet Take 1 tablet (0.5 mg total) by mouth every 8 (eight) hours as needed for anxiety. (Patient not taking: Reported on 08/11/2018) 30 tablet 0  . Multiple Vitamin (MULTIVITAMIN WITH MINERALS) TABS tablet Take 1 tablet by mouth daily. 30 tablet 0  . ondansetron (ZOFRAN) 8 MG tablet Take 1 tablet (8 mg total) by mouth every 8 (eight) hours as needed for nausea. Start on day 3 after chemotherapy (Patient not taking: Reported on 08/11/2018) 30 tablet 3  . polyethylene glycol (MIRALAX / GLYCOLAX) packet Take 17 g by mouth daily as needed for mild constipation. 30 each 0  . prochlorperazine (COMPAZINE) 10 MG tablet Take 1 tablet (10 mg total) by mouth every 6 (six) hours as needed for nausea or vomiting. (Patient not taking: Reported on 08/11/2018) 30 tablet 1   No current facility-administered medications for this visit.     REVIEW OF SYSTEMS:    A 10+ POINT REVIEW OF SYSTEMS WAS OBTAINED including neurology, dermatology, psychiatry, cardiac, respiratory, lymph, extremities, GI, GU, Musculoskeletal, constitutional, breasts, reproductive, HEENT.  All pertinent positives are noted in the HPI.  All others are negative.   PHYSICAL EXAMINATION: ECOG PERFORMANCE STATUS: 2 - Symptomatic, <50% confined to bed  VS reviewed in Epic   GENERAL:alert, in no acute distress and comfortable SKIN: no acute rashes, no significant lesions EYES: conjunctiva are pink and non-injected, sclera anicteric OROPHARYNX: MMM, no exudates, no oropharyngeal erythema or ulceration NECK: supple, no JVD LYMPH:  no palpable lymphadenopathy in the cervical, axillary or inguinal regions LUNGS: clear to auscultation b/l with normal respiratory effort HEART: regular rate & rhythm ABDOMEN:  normoactive bowel sounds , non tender, not distended. No palpable hepatosplenomegaly.  Extremity: no pedal edema PSYCH: alert & oriented x 3 with fluent speech NEURO: no focal motor/sensory deficits   LABORATORY DATA:  I have reviewed the data as listed  . CBC Latest Ref Rng & Units 03/12/2019 12/23/2018 12/11/2018  WBC 4.0 - 10.5 K/uL 7.0 6.9 5.8  Hemoglobin 12.0 - 15.0 g/dL 14.0 14.5 14.2  Hematocrit 36.0 - 46.0 % 41.3 43.1 41.6  Platelets 150 - 400 K/uL 132(L) 146(L) 132(L)  ANC 700  CBC    Component Value Date/Time   WBC 7.0 03/12/2019 1115   RBC 4.39 03/12/2019 1115   HGB 14.0 03/12/2019 1115   HGB 8.5 (L) 08/20/2018 0741   HCT 41.3 03/12/2019 1115   PLT 132 (L) 03/12/2019 1115   PLT 195 08/20/2018 0741   MCV 94.1 03/12/2019 1115   MCH 31.9 03/12/2019 1115   MCHC 33.9 03/12/2019 1115   RDW 13.3 03/12/2019 1115   LYMPHSABS 3.1 03/12/2019 1115   MONOABS 0.5 03/12/2019 1115   EOSABS 0.1 03/12/2019 1115   BASOSABS 0.0 03/12/2019 1115    . CMP Latest Ref Rng & Units 03/12/2019 12/11/2018 10/08/2018  Glucose 70 - 99 mg/dL 226(H) 195(H) 176(H)  BUN 8 - 23 mg/dL _0 Creatinine 0.44 - 1.00 mg/dL 0.81 0.73 0.67  Sodium 135 - 145 mmol/L 140 140 140  Potassium 3.5 - 5.1 mmol/L 4.0 4.2 3.7  Chloride 98 - 111  mmol/L 102 103 104  CO2 22 - 32 mmol/L _0 Calcium 8.9 - 10.3 mg/dL 9.6 9.7 9.5  Total Protein 6.5 - 8.1 g/dL 7.2 7.1 7.0  Total Bilirubin 0.3 - 1.2 mg/dL 0.4 0.3 0.6  Alkaline Phos 38 - 126 U/L 88 88 89  AST 15 - 41 U/L 35 32 37   ALT 0 - 44 U/L 62(H) 59(H) 66(H)    . Lab Results  Component Value Date   LDH 160 03/12/2019      Component     Latest Ref Rng & Units 04/18/2018 05/03/2018  Hepatitis B Surface Ag     Negative  Negative  HCV Ab     0.0 - 0.9 s/co ratio  <0.1  Hep A Ab, IgM     Negative  Negative  Hep B Core Ab, IgM     Negative  Negative  HIV Screen 4th Generation wRfx     Non Reactive Non Reactive     04/21/18 Molecular Pathology:   04/21/18 Surgical Pathology:    RADIOGRAPHIC STUDIES: I have personally reviewed the radiological images as listed and agreed with the findings in the report. No results found.  ASSESSMENT & PLAN:   63 y.o. female with  1. 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 S/P C1 OF CHOP on 05/08/2018 S/p C2 of Brentuximab-CHP on 05/28/2018  S/p C3 of Brentuximab-CHP on 06/18/2018 S/p C4 of Brentuximab-CHP on  07/09/2018 S/p C5 of Brentuximab-CHP on  07/30/2018  07/01/18 PET/CT revealed Resolution of axillary, mediastinal, periaortic, and inguinal lymphadenopathy. ( [place] 1) 2. Decrease in size of spleen.  Normal metabolic activity. 3. Uniform increase in marrow metabolic activity is most consistent with GCSF type response.   09/24/18 PET/CT revealed No evidence of residual or recurrent hypermetabolic lymphoma. ([place] 1). 2. 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 Recommend assessment of coronary risk factors and consideration of medical therapy. 3. Aortic atherosclerosis and emphysema.  Discussed the option to pursue BM transplant after completing treatment and that I would be happy to refer the pt to [facility] for a discussion with a transplant team - this was done but not possible due to lack of insurance. Then referred pt to [facility], and this also was not possible due to lack of insurance.  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  2. General  LNadenopathy from lymphoma with b/l pleural effusion and b/l lower extremity weakness- resolved  3. Anemia/thrombocytopenia - from Bone marrow involvement by lymphoma + Chemotherapy BM Bx confirms involved by T cell lymphoma. Anemia now resolved.  4. 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 LDH wnl no evidnece of overt hemolysis at this timne   5. CMV IgM+ --  Component     Latest Ref Rng & Units 05/09/2018  CMV Quant DNA PCR (Plasma)     Negative IU/mL 8,430  Log10 CMV Qn DNA Pl     log10 IU/mL 3.926    6. 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 S/p Acute hypoxic respiratory failure - required intubation --now extubated and with minimal oxygen needs. Though the rapidity of presentation is unusual would need to consider HCAP and CMV pneumonitis (CMV IgM+ and PCR positive). Resolved hypoxia - treated with antibiotics for aspiration pneumonia.  8 .h/o RUE DVT - US 6/25  PLAN: -Discussed pt labwork today, 03/12/19;  blood sugars are a little high with Glucose at 226. Other blood counts and chemistries are stable. -The pt shows no clinical or lab progression/return of her angioimmunoblastic T-cell lymphoma at this time.  -Discussed that in the absence of transplant, there is a higher possibility of her lymphoma returning. -Again stressed the importance of the pt establishing care with a PCP. -Did previously refer pt to Social Work (has contact information) for continued aid in getting insurance towards being able to qualify for auto HSCT. 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 -Recommend annual flu vaccine and every 5 year pneumonia vaccines which the pt does not want at this time  -Recommended that the pt continue to eat well, drink at least 48-64 oz of water each day, and walk 20-30 minutes each day.  -Will see the pt back in 3 months, sooner if any  new concerns   RTC with Dr Irene Limbo with labs in 3 months   All of the patients questions were answered with apparent satisfaction. The patient knows to call the clinic with any problems, questions or concerns.  The total time spent in the appt was 25 minutes and more than 50% was on counseling and direct patient cares.    Sullivan Lone MD [place] AAHIVMS [facility] Hematology/Oncology Physician [facility]  (Office):       [phone] (Work cell):  [phone] (Fax):           [phone]  I, Baldwin Jamaica, am acting as a scribe for Dr. Sullivan Lone.   .I have reviewed the above documentation for accuracy and completeness, and I agree with the above. Brunetta Genera MD

## 2019-03-12 ENCOUNTER — Other Ambulatory Visit: Payer: Self-pay

## 2019-03-12 ENCOUNTER — Inpatient Hospital Stay: Payer: Self-pay

## 2019-03-12 ENCOUNTER — Inpatient Hospital Stay: Payer: Self-pay | Attending: Hematology | Admitting: Hematology

## 2019-03-12 ENCOUNTER — Telehealth: Payer: Self-pay | Admitting: Hematology

## 2019-03-12 VITALS — BP 132/56 | HR 94 | Temp 98.0°F | Resp 18 | Ht 61.81 in | Wt 129.2 lb

## 2019-03-12 DIAGNOSIS — D649 Anemia, unspecified: Secondary | ICD-10-CM | POA: Insufficient documentation

## 2019-03-12 DIAGNOSIS — C865 Angioimmunoblastic T-cell lymphoma: Secondary | ICD-10-CM | POA: Insufficient documentation

## 2019-03-12 DIAGNOSIS — C8440 Peripheral T-cell lymphoma, not classified, unspecified site: Secondary | ICD-10-CM

## 2019-03-12 DIAGNOSIS — I10 Essential (primary) hypertension: Secondary | ICD-10-CM | POA: Insufficient documentation

## 2019-03-12 DIAGNOSIS — F17210 Nicotine dependence, cigarettes, uncomplicated: Secondary | ICD-10-CM | POA: Insufficient documentation

## 2019-03-12 DIAGNOSIS — Z79899 Other long term (current) drug therapy: Secondary | ICD-10-CM | POA: Insufficient documentation

## 2019-03-12 DIAGNOSIS — Z803 Family history of malignant neoplasm of breast: Secondary | ICD-10-CM | POA: Insufficient documentation

## 2019-03-12 DIAGNOSIS — D696 Thrombocytopenia, unspecified: Secondary | ICD-10-CM

## 2019-03-12 DIAGNOSIS — Z9221 Personal history of antineoplastic chemotherapy: Secondary | ICD-10-CM | POA: Insufficient documentation

## 2019-03-12 LAB — CMP (CANCER CENTER ONLY)
ALT: 62 U/L — ABNORMAL HIGH (ref 0–44)
AST: 35 U/L (ref 15–41)
Albumin: 4 g/dL (ref 3.5–5.0)
Alkaline Phosphatase: 88 U/L (ref 38–126)
Anion gap: 11 (ref 5–15)
BUN: 8 mg/dL (ref 8–23)
CO2: 27 mmol/L (ref 22–32)
Calcium: 9.6 mg/dL (ref 8.9–10.3)
Chloride: 102 mmol/L (ref 98–111)
Creatinine: 0.81 mg/dL (ref 0.44–1.00)
GFR, Est AFR Am: >60 mL/min (ref >60)
GFR, Estimated: >60 mL/min (ref >60)
Glucose, Bld: 226 mg/dL — ABNORMAL HIGH (ref 70–99)
Potassium: 4 mmol/L (ref 3.5–5.1)
Sodium: 140 mmol/L (ref 135–145)
Total Bilirubin: 0.4 mg/dL (ref 0.3–1.2)
Total Protein: 7.2 g/dL (ref 6.5–8.1)

## 2019-03-12 LAB — CBC WITH DIFFERENTIAL/PLATELET
Abs Immature Granulocytes: 0.03 10*3/uL (ref 0.00–0.07)
Basophils Absolute: 0 10*3/uL (ref 0.0–0.1)
Basophils Relative: 0 %
Eosinophils Absolute: 0.1 10*3/uL (ref 0.0–0.5)
Eosinophils Relative: 1 %
HCT: 41.3 % (ref 36.0–46.0)
Hemoglobin: 14 g/dL (ref 12.0–15.0)
Immature Granulocytes: 0 %
Lymphocytes Relative: 44 %
Lymphs Abs: 3.1 10*3/uL (ref 0.7–4.0)
MCH: 31.9 pg (ref 26.0–34.0)
MCHC: 33.9 g/dL (ref 30.0–36.0)
MCV: 94.1 fL (ref 80.0–100.0)
Monocytes Absolute: 0.5 10*3/uL (ref 0.1–1.0)
Monocytes Relative: 6 %
Neutro Abs: 3.4 10*3/uL (ref 1.7–7.7)
Neutrophils Relative %: 49 %
Platelets: 132 10*3/uL — ABNORMAL LOW (ref 150–400)
RBC: 4.39 MIL/uL (ref 3.87–5.11)
RDW: 13.3 % (ref 11.5–15.5)
WBC: 7 10*3/uL (ref 4.0–10.5)
nRBC: 0 % (ref 0.0–0.2)

## 2019-03-12 LAB — LACTATE DEHYDROGENASE: LDH: 160 U/L (ref 98–192)

## 2019-03-12 NOTE — Telephone Encounter (Signed)
Scheduled appt per 4/23 los. °

## 2019-03-13 LAB — MULTIPLE MYELOMA PANEL, SERUM
Albumin SerPl Elph-Mcnc: 4.1 g/dL (ref 2.9–4.4)
Albumin/Glob SerPl: 1.7 (ref 0.7–1.7)
Alpha 1: 0.8 g/dL — ABNORMAL HIGH (ref 0.0–0.4)
Alpha2 Glob SerPl Elph-Mcnc: 0.5 g/dL (ref 0.4–1.0)
B-Globulin SerPl Elph-Mcnc: 0.6 g/dL — ABNORMAL LOW (ref 0.7–1.3)
Gamma Glob SerPl Elph-Mcnc: 0.6 g/dL (ref 0.4–1.8)
Globulin, Total: 2.5 g/dL (ref 2.2–3.9)
IgA: 245 mg/dL (ref 87–352)
IgG (Immunoglobin G), Serum: 859 mg/dL (ref 586–1602)
IgM (Immunoglobulin M), Srm: 80 mg/dL (ref 26–217)
Total Protein ELP: 6.6 g/dL (ref 6.0–8.5)

## 2019-06-10 NOTE — Progress Notes (Signed)
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 female who has been referred to [place] by Dr Joni Reining for evaluation and management of her newly diagnosed angioimmunoblastic T cell lymphoma.   Patient has a h/o HTN, endometriosis, tobacco use who was admitted to the hospital with shortness of breath and was subsequently found to have diffuse lymphadenopathy. She was also found to have bilateral pleural effusions and has had a thoracentesis with pleural fluid demonstrating atypical lymphocytosis. She was discharged but returned to the ED with complaints of persisting SOB, abdominal swelling, and lower extremity swelling.   She has had a  CTA chest and CT abd/pelvis which showed no PE.but extensive LNadenopathy in the chest and throughout the abd with b/l pleural effusion and mild ascites. Also noted to have narrowing in b/l common iliac arteries.  Clinically she is noted to have b/l LE edema 1-2+  Most recent lab results (05/05/18) of CBC  is as follows: all values are WNL except for RBC at 2.59, HGB at 7.7, HCT at 22.7, RDW at 16.0, PLT at 89k.  She has had a CT bone marrow biopsy on 05/05/2018 -results pending. ECHO was done and showed  Systolic function was   normal. The estimated ejection fraction was in the range of 60%   to 65%. Wall motion was normal; there were no regional wall   motion abnormalities. Left ventricular diastolic function   parameters were normal.  Port a cath placement requested.  Patient was transferred to [facility] with her consent to receive C1 of chemotherapy as inpatient.  Interval History:   Deborah Cooley returns today for management and  evaluation of her Angioimmunoblastic T cell lymphoma. The patient's last visit with [place] was on 03/12/2019. The pt reports that she is doing well overall.  The pt reports that she has been doing very well and does not have any new concerns. She experiences night sweats but has had them for years. Denies belly pain, back pain, unexpected weight changes, fever, and chills. Her weight is almost back to baseline.  Lab results today (06/11/2019) of CBC w/diff and CMP is as follows: all values are WNL except for PLT at 144k, glucose at 143, and AST at 53. 06/11/2019 LDH is . Lab Results  Component Value Date   LDH 177 06/11/2019   On review of systems, pt reports chronic night sweats and denies belly pain, unexpected weight changes, mouth sores, fever, chills, back pain, and any other symptoms.   MEDICAL HISTORY:  Past Medical History:  Diagnosis Date  . Bilateral pleural effusion 04/17/2018  . Endometriosis   . Essential hypertension, benign   . Family history of adverse reaction to anesthesia    "sister stops breathing I think" (04/18/2018)  . Heart murmur   . Mixed hyperlipidemia   . Pneumonia 04/17/2018    SURGICAL HISTORY: Past Surgical History:  Procedure Laterality Date  . ABDOMINAL HYSTERECTOMY  1970s/1980s   for endometriosis  . Arthroscopic right shoulder surgery    . AXILLARY LYMPH NODE BIOPSY Left 04/21/2018   Procedure: LEFT AXILLARY LYMPH NODE BIOPSY;  Surgeon: Kieth Brightly, Arta Bruce, MD;  Location: [facility];  Service: General;  Laterality: Left;  . CARPAL  TUNNEL RELEASE Right   . GANGLION CYST EXCISION Right   . IR IMAGING GUIDED PORT INSERTION  05/07/2018  . IR REMOVAL TUN ACCESS W/ PORT W/O FL MOD SED  12/23/2018  . MULTIPLE TOOTH EXTRACTIONS    . OOPHORECTOMY  1997  . PARTIAL HYSTERECTOMY  1984  . Right hand surgery    . SHOULDER OPEN ROTATOR CUFF REPAIR Right   . VESICOVAGINAL FISTULA CLOSURE W/ TAH      SOCIAL HISTORY: Social History   Socioeconomic History  .  Marital status: Married    Spouse name: Not on file  . Number of children: Not on file  . Years of education: Not on file  . Highest education level: Not on file  Occupational History  . Occupation: Social research officer, government    Comment: works at [employer]  . Financial resource strain: Not on file  . Food insecurity    Worry: Not on file    Inability: Not on file  . Transportation needs    Medical: Not on file    Non-medical: Not on file  Tobacco Use  . Smoking status: Current Every Day Smoker    Packs/day: 1.50    Years: 46.00    Pack years: 69.00    Types: Cigarettes  . Smokeless tobacco: Never Used  Substance and Sexual Activity  . Alcohol use: Not Currently    Comment: 04/18/2018 "quit years ago"  . Drug use: Not Currently    Comment: "back in my 48s"  . Sexual activity: Not Currently  Lifestyle  . Physical activity    Days per week: Not on file    Minutes per session: Not on file  . Stress: Not on file  Relationships  . Social Herbalist on phone: Not on file    Gets together: Not on file    Attends religious service: Not on file    Active member of club or organization: Not on file    Attends meetings of clubs or organizations: Not on file    Relationship status: Not on file  . Intimate partner violence    Fear of current or ex partner: Not on file    Emotionally abused: Not on file    Physically abused: Not on file    Forced sexual activity: Not on file  Other Topics Concern  . Not on file  Social History Narrative   ** Merged History Encounter **       Patient has one son whom is healthy.   Has a live in boyfriend.    FAMILY HISTORY: Family History  Problem Relation Age of Onset  . Diabetes Mother        age 23  . Heart attack Father        died age 23's  . Hypertension Father   . Cancer Sister        breast cancer diagonsed age 54 years  . Diabetes Other   . Heart disease Other        Female <55  . Arthritis Other   .  Asthma Other   . Hyperlipidemia Other        several siblings  . Thyroid disease Sister        one sister with thyroid disease    ALLERGIES:  is allergic to [place] [zolpidem].  MEDICATIONS:  Current Outpatient Medications  Medication Sig Dispense Refill  . acetaminophen (TYLENOL) 325 MG tablet Take 2 tablets (650 mg total)  by mouth every 6 (six) hours as needed for mild pain (or Fever >/= 101).    Marland Kitchen lidocaine-prilocaine (EMLA) cream Apply a dime size to port-a-cath 1-2 hours prior to access. Cover with saran wrap. 30 g 2  . LORazepam (ATIVAN) 0.5 MG tablet Take 1 tablet (0.5 mg total) by mouth every 8 (eight) hours as needed for anxiety. (Patient not taking: Reported on 08/11/2018) 30 tablet 0  . Multiple Vitamin (MULTIVITAMIN WITH MINERALS) TABS tablet Take 1 tablet by mouth daily. 30 tablet 0  . ondansetron (ZOFRAN) 8 MG tablet Take 1 tablet (8 mg total) by mouth every 8 (eight) hours as needed for nausea. Start on day 3 after chemotherapy (Patient not taking: Reported on 08/11/2018) 30 tablet 3  . polyethylene glycol (MIRALAX / GLYCOLAX) packet Take 17 g by mouth daily as needed for mild constipation. 30 each 0  . prochlorperazine (COMPAZINE) 10 MG tablet Take 1 tablet (10 mg total) by mouth every 6 (six) hours as needed for nausea or vomiting. (Patient not taking: Reported on 08/11/2018) 30 tablet 1   No current facility-administered medications for this visit.     REVIEW OF SYSTEMS:    A 10+ POINT REVIEW OF SYSTEMS WAS OBTAINED including neurology, dermatology, psychiatry, cardiac, respiratory, lymph, extremities, GI, GU, Musculoskeletal, constitutional, breasts, reproductive, HEENT.  All pertinent positives are noted in the HPI.  All others are negative.   PHYSICAL EXAMINATION: ECOG PERFORMANCE STATUS: 2 - Symptomatic, <50% confined to bed   GENERAL:alert, in no acute distress and comfortable SKIN: no acute rashes, no significant lesions EYES: conjunctiva are pink and non-injected,  sclera anicteric OROPHARYNX: MMM, no exudates, no oropharyngeal erythema or ulceration NECK: supple, no JVD LYMPH:  no palpable lymphadenopathy in the cervical, axillary or inguinal regions LUNGS: clear to auscultation b/l with normal respiratory effort HEART: regular rate & rhythm ABDOMEN:  normoactive bowel sounds , non tender, not distended. No palpable hepatosplenomegaly.  Extremity: no pedal edema PSYCH: alert & oriented x 3 with fluent speech NEURO: no focal motor/sensory deficits   LABORATORY DATA:  I have reviewed the data as listed  . CBC Latest Ref Rng & Units 06/11/2019 03/12/2019 12/23/2018  WBC 4.0 - 10.5 K/uL 8.1 7.0 6.9  Hemoglobin 12.0 - 15.0 g/dL 13.7 14.0 14.5  Hematocrit 36.0 - 46.0 % 39.4 41.3 43.1  Platelets 150 - 400 K/uL 144(L) 132(L) 146(L)  ANC 700  CBC    Component Value Date/Time   WBC 8.1 06/11/2019 1107   RBC 4.29 06/11/2019 1107   HGB 13.7 06/11/2019 1107   HGB 8.5 (L) 08/20/2018 0741   HCT 39.4 06/11/2019 1107   PLT 144 (L) 06/11/2019 1107   PLT 195 08/20/2018 0741   MCV 91.8 06/11/2019 1107   MCH 31.9 06/11/2019 1107   MCHC 34.8 06/11/2019 1107   RDW 13.0 06/11/2019 1107   LYMPHSABS 3.3 06/11/2019 1107   MONOABS 0.7 06/11/2019 1107   EOSABS 0.1 06/11/2019 1107   BASOSABS 0.0 06/11/2019 1107    . CMP Latest Ref Rng & Units 06/11/2019 03/12/2019 12/11/2018  Glucose 70 - 99 mg/dL 143(H) 226(H) 195(H)  BUN 8 - 23 mg/dL '9 8 10  ' Creatinine 0.44 - 1.00 mg/dL 0.71 0.81 0.73  Sodium 135 - 145 mmol/L 138 140 140  Potassium 3.5 - 5.1 mmol/L 3.9 4.0 4.2  Chloride 98 - 111 mmol/L 104 102 103  CO2 22 - 32 mmol/L '25 27 28  ' Calcium 8.9 - 10.3 mg/dL 9.9 9.6 9.7  Total Protein  6.5 - 8.1 g/dL 7.2 7.2 7.1  Total Bilirubin 0.3 - 1.2 mg/dL 0.5 0.4 0.3  Alkaline Phos 38 - 126 U/L 94 88 88  AST 15 - 41 U/L 30 35 32  ALT 0 - 44 U/L 53(H) 62(H) 59(H)    . Lab Results  Component Value Date   LDH 160 03/12/2019      Component     Latest Ref Rng & Units  04/18/2018 05/03/2018  Hepatitis B Surface Ag     Negative  Negative  HCV Ab     0.0 - 0.9 s/co ratio  <0.1  Hep A Ab, IgM     Negative  Negative  Hep B Core Ab, IgM     Negative  Negative  HIV Screen 4th Generation wRfx     Non Reactive Non Reactive     04/21/18 Molecular Pathology:   04/21/18 Surgical Pathology:    RADIOGRAPHIC STUDIES: I have personally reviewed the radiological images as listed and agreed with the findings in the report. No results found.  ASSESSMENT & PLAN:   63 y.o. female with  1. 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 S/P C1 OF CHOP on 05/08/2018 S/p C2 of Brentuximab-CHP on 05/28/2018  S/p C3 of Brentuximab-CHP on 06/18/2018 S/p C4 of Brentuximab-CHP on  07/09/2018 S/p C5 of Brentuximab-CHP on  07/30/2018  07/01/18 PET/CT revealed Resolution of axillary, mediastinal, periaortic, and inguinal lymphadenopathy. ( [place] 1) 2. Decrease in size of spleen.  Normal metabolic activity. 3. Uniform increase in marrow metabolic activity is most consistent with GCSF type response.   09/24/18 PET/CT revealed No evidence of residual or recurrent hypermetabolic lymphoma. ([place] 1). 2. 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 Recommend assessment of coronary risk factors and consideration of medical therapy. 3. Aortic atherosclerosis and emphysema.  Discussed the option to pursue BM transplant after completing treatment and that I would be happy to refer the pt to [facility] for a discussion with a transplant team - this was done but not possible due to lack of insurance. Then referred pt to [facility], and this also was not possible due to lack of insurance.  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  2. General LNadenopathy from lymphoma with b/l pleural effusion and b/l lower extremity weakness- resolved  3. Anemia/thrombocytopenia - from Bone marrow  involvement by lymphoma + Chemotherapy BM Bx confirms involved by T cell lymphoma. Anemia now resolved. Mild stable thrombocytopenia  4. 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 LDH wnl no evidnece of overt hemolysis at this timne   5. 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 ALT slightly elevated -The pt shows no clinical or lab progression/return of her angioimmunoblastic T-cell lymphoma at this time.  -Discussed that in the absence of transplant, there is a higher possibility of her lymphoma returning. -Did previously refer pt to Social Work (has contact information) for continued aid in getting insurance towards being able to qualify for auto HSCT. 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 -Schedule a CT before next visit -Will see the pt back in 3 months   -RTC with Dr Irene Limbo with labs in 3 months -CT chest/abd/pelvis in 12 weeks  All of the patients questions were answered with apparent satisfaction. The patient knows to call the clinic with any problems, questions or concerns.  The total time spent in the appt was 20 minutes and more than 50% was on counseling and direct patient cares.   Sullivan Lone MD [place] AAHIVMS [facility] Hematology[facility]On[facility]  (Office):       [phone] (Work cell):  [phone] (Fax):           [phone]  I, De Burrs, am acting as a scribe for Dr. Irene Limbo  .I have reviewed the above documentation for accuracy and completeness, and I agree with the above. Brunetta Genera MD

## 2019-06-11 ENCOUNTER — Inpatient Hospital Stay: Payer: Self-pay | Attending: Hematology

## 2019-06-11 ENCOUNTER — Telehealth: Payer: Self-pay | Admitting: Hematology

## 2019-06-11 ENCOUNTER — Other Ambulatory Visit: Payer: Self-pay

## 2019-06-11 ENCOUNTER — Inpatient Hospital Stay (HOSPITAL_BASED_OUTPATIENT_CLINIC_OR_DEPARTMENT_OTHER): Payer: Self-pay | Admitting: Hematology

## 2019-06-11 VITALS — BP 140/63 | HR 78 | Temp 98.6°F | Resp 18 | Ht 61.81 in | Wt 132.8 lb

## 2019-06-11 DIAGNOSIS — I10 Essential (primary) hypertension: Secondary | ICD-10-CM | POA: Insufficient documentation

## 2019-06-11 DIAGNOSIS — F17210 Nicotine dependence, cigarettes, uncomplicated: Secondary | ICD-10-CM

## 2019-06-11 DIAGNOSIS — D696 Thrombocytopenia, unspecified: Secondary | ICD-10-CM

## 2019-06-11 DIAGNOSIS — C8440 Peripheral T-cell lymphoma, not classified, unspecified site: Secondary | ICD-10-CM

## 2019-06-11 DIAGNOSIS — C865 Angioimmunoblastic T-cell lymphoma: Secondary | ICD-10-CM | POA: Insufficient documentation

## 2019-06-11 LAB — CBC WITH DIFFERENTIAL/PLATELET
Abs Immature Granulocytes: 0.04 10*3/uL (ref 0.00–0.07)
Basophils Absolute: 0 10*3/uL (ref 0.0–0.1)
Basophils Relative: 0 %
Eosinophils Absolute: 0.1 10*3/uL (ref 0.0–0.5)
Eosinophils Relative: 1 %
HCT: 39.4 % (ref 36.0–46.0)
Hemoglobin: 13.7 g/dL (ref 12.0–15.0)
Immature Granulocytes: 1 %
Lymphocytes Relative: 41 %
Lymphs Abs: 3.3 10*3/uL (ref 0.7–4.0)
MCH: 31.9 pg (ref 26.0–34.0)
MCHC: 34.8 g/dL (ref 30.0–36.0)
MCV: 91.8 fL (ref 80.0–100.0)
Monocytes Absolute: 0.7 10*3/uL (ref 0.1–1.0)
Monocytes Relative: 9 %
Neutro Abs: 4 10*3/uL (ref 1.7–7.7)
Neutrophils Relative %: 48 %
Platelets: 144 10*3/uL — ABNORMAL LOW (ref 150–400)
RBC: 4.29 MIL/uL (ref 3.87–5.11)
RDW: 13 % (ref 11.5–15.5)
WBC: 8.1 10*3/uL (ref 4.0–10.5)
nRBC: 0 % (ref 0.0–0.2)

## 2019-06-11 LAB — CMP (CANCER CENTER ONLY)
ALT: 53 U/L — ABNORMAL HIGH (ref 0–44)
AST: 30 U/L (ref 15–41)
Albumin: 4.2 g/dL (ref 3.5–5.0)
Alkaline Phosphatase: 94 U/L (ref 38–126)
Anion gap: 9 (ref 5–15)
BUN: 9 mg/dL (ref 8–23)
CO2: 25 mmol/L (ref 22–32)
Calcium: 9.9 mg/dL (ref 8.9–10.3)
Chloride: 104 mmol/L (ref 98–111)
Creatinine: 0.71 mg/dL (ref 0.44–1.00)
GFR, Est AFR Am: >60 mL/min (ref >60)
GFR, Estimated: >60 mL/min (ref >60)
Glucose, Bld: 143 mg/dL — ABNORMAL HIGH (ref 70–99)
Potassium: 3.9 mmol/L (ref 3.5–5.1)
Sodium: 138 mmol/L (ref 135–145)
Total Bilirubin: 0.5 mg/dL (ref 0.3–1.2)
Total Protein: 7.2 g/dL (ref 6.5–8.1)

## 2019-06-11 LAB — LACTATE DEHYDROGENASE: LDH: 177 U/L (ref 98–192)

## 2019-06-11 NOTE — Telephone Encounter (Signed)
Scheduled appt per 7/23 los.  Printed and mailed appt calendar.

## 2019-08-11 ENCOUNTER — Other Ambulatory Visit: Payer: Self-pay | Admitting: *Deleted

## 2019-08-11 ENCOUNTER — Telehealth: Payer: Self-pay | Admitting: Hematology

## 2019-08-11 DIAGNOSIS — C8440 Peripheral T-cell lymphoma, not classified, unspecified site: Secondary | ICD-10-CM

## 2019-08-11 NOTE — Telephone Encounter (Signed)
Scheduled appt per 9/22 sch message - no answer and no vmail

## 2019-08-13 ENCOUNTER — Inpatient Hospital Stay: Payer: Self-pay | Attending: Hematology

## 2019-08-13 ENCOUNTER — Ambulatory Visit (HOSPITAL_COMMUNITY)
Admission: RE | Admit: 2019-08-13 | Discharge: 2019-08-13 | Disposition: A | Discharge status: Home / Self Care | Payer: Self-pay | Source: Ambulatory Visit | Attending: Hematology | Admitting: Hematology

## 2019-08-13 ENCOUNTER — Encounter (HOSPITAL_COMMUNITY): Payer: Self-pay

## 2019-08-13 ENCOUNTER — Other Ambulatory Visit: Payer: Self-pay

## 2019-08-13 DIAGNOSIS — C865 Angioimmunoblastic T-cell lymphoma: Secondary | ICD-10-CM | POA: Insufficient documentation

## 2019-08-13 DIAGNOSIS — C8440 Peripheral T-cell lymphoma, not classified, unspecified site: Secondary | ICD-10-CM | POA: Insufficient documentation

## 2019-08-13 LAB — BASIC METABOLIC PANEL - CANCER CENTER ONLY
Anion gap: 11 (ref 5–15)
BUN: 6 mg/dL — ABNORMAL LOW (ref 8–23)
CO2: 27 mmol/L (ref 22–32)
Calcium: 9.6 mg/dL (ref 8.9–10.3)
Chloride: 104 mmol/L (ref 98–111)
Creatinine: 0.73 mg/dL (ref 0.44–1.00)
GFR, Est AFR Am: >60 mL/min (ref >60)
GFR, Estimated: >60 mL/min (ref >60)
Glucose, Bld: 168 mg/dL — ABNORMAL HIGH (ref 70–99)
Potassium: 4.3 mmol/L (ref 3.5–5.1)
Sodium: 142 mmol/L (ref 135–145)

## 2019-08-13 MED ORDER — SODIUM CHLORIDE (PF) 0.9 % IJ SOLN
INTRAMUSCULAR | Status: AC
  Filled 2019-08-13: qty 50

## 2019-08-13 MED ORDER — IOHEXOL 300 MG/ML  SOLN
100.0000 mL | Freq: Once | INTRAMUSCULAR | Status: AC | PRN
  Administered 2019-08-13: 100 mL via INTRAVENOUS

## 2019-08-14 ENCOUNTER — Telehealth: Payer: Self-pay | Admitting: Hematology

## 2019-08-14 NOTE — Telephone Encounter (Signed)
Left message re 10/2 phone visit. Schedule mailed.

## 2019-08-20 ENCOUNTER — Telehealth: Payer: Self-pay | Admitting: Hematology

## 2019-08-20 NOTE — Telephone Encounter (Signed)
Left voicemail to confirm appt and verify info. °

## 2019-08-21 ENCOUNTER — Inpatient Hospital Stay: Payer: Self-pay | Admitting: Hematology

## 2019-08-21 ENCOUNTER — Telehealth: Payer: Self-pay | Admitting: Hematology

## 2019-08-21 NOTE — Telephone Encounter (Signed)
Unable to reach pt and unable to leave message. - per 10/2 sch message - sent message to let RN know

## 2019-08-24 ENCOUNTER — Inpatient Hospital Stay: Payer: Self-pay | Attending: Hematology

## 2019-08-24 ENCOUNTER — Other Ambulatory Visit: Payer: Self-pay

## 2019-08-24 DIAGNOSIS — C865 Angioimmunoblastic T-cell lymphoma: Secondary | ICD-10-CM | POA: Insufficient documentation

## 2019-08-24 DIAGNOSIS — C8440 Peripheral T-cell lymphoma, not classified, unspecified site: Secondary | ICD-10-CM

## 2019-08-24 LAB — CMP (CANCER CENTER ONLY)
ALT: 55 U/L — ABNORMAL HIGH (ref 0–44)
AST: 27 U/L (ref 15–41)
Albumin: 4.1 g/dL (ref 3.5–5.0)
Alkaline Phosphatase: 81 U/L (ref 38–126)
Anion gap: 10 (ref 5–15)
BUN: 10 mg/dL (ref 8–23)
CO2: 26 mmol/L (ref 22–32)
Calcium: 9.6 mg/dL (ref 8.9–10.3)
Chloride: 102 mmol/L (ref 98–111)
Creatinine: 0.76 mg/dL (ref 0.44–1.00)
GFR, Est AFR Am: >60 mL/min (ref >60)
GFR, Estimated: >60 mL/min (ref >60)
Glucose, Bld: 261 mg/dL — ABNORMAL HIGH (ref 70–99)
Potassium: 4.1 mmol/L (ref 3.5–5.1)
Sodium: 138 mmol/L (ref 135–145)
Total Bilirubin: 0.5 mg/dL (ref 0.3–1.2)
Total Protein: 6.9 g/dL (ref 6.5–8.1)

## 2019-08-24 LAB — CBC WITH DIFFERENTIAL/PLATELET
Abs Immature Granulocytes: 0.03 10*3/uL (ref 0.00–0.07)
Basophils Absolute: 0 10*3/uL (ref 0.0–0.1)
Basophils Relative: 1 %
Eosinophils Absolute: 0.1 10*3/uL (ref 0.0–0.5)
Eosinophils Relative: 1 %
HCT: 41.3 % (ref 36.0–46.0)
Hemoglobin: 14.1 g/dL (ref 12.0–15.0)
Immature Granulocytes: 1 %
Lymphocytes Relative: 40 %
Lymphs Abs: 2.6 10*3/uL (ref 0.7–4.0)
MCH: 31.7 pg (ref 26.0–34.0)
MCHC: 34.1 g/dL (ref 30.0–36.0)
MCV: 92.8 fL (ref 80.0–100.0)
Monocytes Absolute: 0.6 10*3/uL (ref 0.1–1.0)
Monocytes Relative: 8 %
Neutro Abs: 3.2 10*3/uL (ref 1.7–7.7)
Neutrophils Relative %: 49 %
Platelets: 138 10*3/uL — ABNORMAL LOW (ref 150–400)
RBC: 4.45 MIL/uL (ref 3.87–5.11)
RDW: 12.9 % (ref 11.5–15.5)
WBC: 6.6 10*3/uL (ref 4.0–10.5)
nRBC: 0 % (ref 0.0–0.2)

## 2019-08-24 LAB — LACTATE DEHYDROGENASE: LDH: 174 U/L (ref 98–192)

## 2019-08-26 LAB — MULTIPLE MYELOMA PANEL, SERUM
Albumin SerPl Elph-Mcnc: 3.8 g/dL (ref 2.9–4.4)
Albumin/Glob SerPl: 1.6 (ref 0.7–1.7)
Alpha 1: 0.1 g/dL (ref 0.0–0.4)
Alpha2 Glob SerPl Elph-Mcnc: 1 g/dL (ref 0.4–1.0)
B-Globulin SerPl Elph-Mcnc: 0.8 g/dL (ref 0.7–1.3)
Gamma Glob SerPl Elph-Mcnc: 0.6 g/dL (ref 0.4–1.8)
Globulin, Total: 2.5 g/dL (ref 2.2–3.9)
IgA: 211 mg/dL (ref 87–352)
IgG (Immunoglobin G), Serum: 747 mg/dL (ref 586–1602)
IgM (Immunoglobulin M), Srm: 57 mg/dL (ref 26–217)
Total Protein ELP: 6.3 g/dL (ref 6.0–8.5)

## 2019-08-30 NOTE — Progress Notes (Signed)
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 female who has been referred to [place] by Dr Joni Reining for evaluation and management of her newly diagnosed angioimmunoblastic T cell lymphoma.   Patient has a h/o HTN, endometriosis, tobacco use who was admitted to the hospital with shortness of breath and was subsequently found to have diffuse lymphadenopathy. She was also found to have bilateral pleural effusions and has had a thoracentesis with pleural fluid demonstrating atypical lymphocytosis. She was discharged but returned to the ED with complaints of persisting SOB, abdominal swelling, and lower extremity swelling.   She has had a  CTA chest and CT abd/pelvis which showed no PE.but extensive LNadenopathy in the chest and throughout the abd with b/l pleural effusion and mild ascites. Also noted to have narrowing in b/l common iliac arteries.  Clinically she is noted to have b/l LE edema 1-2+  Most recent lab results (05/05/18) of CBC  is as follows: all values are WNL except for RBC at 2.59, HGB at 7.7, HCT at 22.7, RDW at 16.0, PLT at 89k.  She has had a CT bone marrow biopsy on 05/05/2018 -results pending. ECHO was done and showed  Systolic function was   normal. The estimated ejection fraction was in the range of 60%   to 65%. Wall motion was normal; there were no regional wall   motion abnormalities. Left ventricular diastolic function   parameters were normal.  Port a cath placement requested.  Patient was transferred to [facility] with her consent to receive C1 of chemotherapy as inpatient.  Interval History:  I connected with  Deborah Cooley on 08/31/19 by  telephone and verified that I am speaking with the correct person using two identifiers.   I discussed the limitations of evaluation and management by telemedicine. The patient expressed understanding and agreed to proceed.  Other persons participating in the visit and their role in the encounter:     -Yevette Edwards, Medical Scribe  Patient's location: Home Provider's location: [facility] returns today for management and evaluation of her Angioimmunoblastic T cell lymphoma. The patient's last visit with [place] was on 06/11/2019. The pt reports that she is doing well overall.  The pt reports that she has been good in the interim and has had no notable symptoms. Pt does not have a PCP. She has not heard anything from her lawyer regarding her being able to file for Medicare. Pt has began smoking cigarettes about 1ppd. She denies that she has been coughing up any phlegm.   Of note since the patient's last visit, pt has had CT C/A/P ([id number]) completed on 08/13/2019 with results revealing "1. No recurrent adenopathy to suggest active malignancy. 2. Subtle ground-glass nodularity medially at the left lung apex, likely inflammatory. 3. Other imaging findings of potential clinical significance: Aortic Atherosclerosis (ICD10-I70.0). Coronary atherosclerosis. Emphysema (ICD10-J43.9). Airway thickening is present, suggesting bronchitis or reactive airways disease. Stable calcified right thyroid nodule. 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 08/24/2019 LDH  at 174 08/24/2019 MMP is as follows: all values are WNL   On review of systems, pt denies leg swelling, abdominal pain, swelling in the neck/under the arms, cough with phlegm, fevers, infection issues and any other symptoms.    MEDICAL HISTORY:  Past  Medical History:  Diagnosis Date   Bilateral pleural effusion 04/17/2018   Endometriosis    Essential hypertension, benign    Family history of adverse reaction to anesthesia    "sister stops breathing I think" (04/18/2018)   Heart murmur    Mixed hyperlipidemia    Pneumonia 04/17/2018    SURGICAL HISTORY: Past Surgical History:  Procedure Laterality Date   ABDOMINAL HYSTERECTOMY  1970s/1980s   for endometriosis   Arthroscopic right shoulder surgery     AXILLARY LYMPH NODE BIOPSY Left 04/21/2018   Procedure: LEFT AXILLARY LYMPH NODE BIOPSY;  Surgeon: Kieth Brightly, Arta Bruce, MD;  Location: [place];  Service: General;  Laterality: Left;   CARPAL TUNNEL RELEASE Right    GANGLION CYST EXCISION Right    IR IMAGING GUIDED PORT INSERTION  05/07/2018   IR REMOVAL TUN ACCESS W/ PORT W/O FL MOD SED  12/23/2018   MULTIPLE TOOTH EXTRACTIONS     OOPHORECTOMY  1997   PARTIAL HYSTERECTOMY  1984   Right hand surgery     SHOULDER OPEN ROTATOR CUFF REPAIR Right    VESICOVAGINAL FISTULA CLOSURE W/ TAH      SOCIAL HISTORY: Social History   Socioeconomic History   Marital status: Married    Spouse name: Not on file   Number of children: Not on file   Years of education: Not on file   Highest education level: Not on file  Occupational History   Occupation: Social research officer, government    Comment: works at [employer] resource strain: Not on file   Food insecurity    Worry: Not on file    Inability: Not on file   Transportation needs    Medical: Not on file    Non-medical: Not on file  Tobacco Use   Smoking status: Current Every Day Smoker    Packs/day: 1.50    Years: 46.00    Pack years: 69.00    Types: Cigarettes   Smokeless tobacco: Never Used  Substance and Sexual Activity   Alcohol use: Not Currently    Comment: 04/18/2018 "quit years ago"   Drug use: Not Currently    Comment: "back in my 16s"   Sexual activity: Not Currently    Lifestyle   Physical activity    Days per week: Not on file    Minutes per session: Not on file   Stress: Not on file  Relationships   Social connections    Talks on phone: Not on file    Gets together: Not on file    Attends religious service: Not on file    Active member of club or organization: Not on file    Attends meetings of clubs or organizations: Not on file    Relationship status: Not on file   Intimate partner violence    Fear of current or ex partner: Not on file    Emotionally abused: Not on file    Physically abused: Not on file    Forced sexual activity: Not on file  Other Topics Concern   Not on file  Social History Narrative   ** Merged History Encounter **       Patient has one son whom  is healthy.   Has a live in boyfriend.    FAMILY HISTORY: Family History  Problem Relation Age of Onset   Diabetes Mother        age 15   Heart attack Father        died age 63's   Hypertension Father    Cancer Sister        breast cancer diagonsed age 56 years   Diabetes Other    Heart disease Other        Female <55   Arthritis Other    Asthma Other    Hyperlipidemia Other        several siblings   Thyroid disease Sister        one sister with thyroid disease    ALLERGIES:  is allergic to [place] [zolpidem].  MEDICATIONS:  Current Outpatient Medications  Medication Sig Dispense Refill   Multiple Vitamin (MULTIVITAMIN WITH MINERALS) TABS tablet Take 1 tablet by mouth daily. 30 tablet 0   No current facility-administered medications for this visit.     REVIEW OF SYSTEMS:    A 10+ POINT REVIEW OF SYSTEMS WAS OBTAINED including neurology, dermatology, psychiatry, cardiac, respiratory, lymph, extremities, GI, GU, Musculoskeletal, constitutional, breasts, reproductive, HEENT.  All pertinent positives are noted in the HPI.  All others are negative.   PHYSICAL EXAMINATION: ECOG PERFORMANCE STATUS: 2 - Symptomatic, <50% confined to bed   Phone  visit   LABORATORY DATA:  I have reviewed the data as listed  . CBC Latest Ref Rng & Units 08/24/2019 06/11/2019 03/12/2019  WBC 4.0 - 10.5 K/uL 6.6 8.1 7.0  Hemoglobin 12.0 - 15.0 g/dL 14.1 13.7 14.0  Hematocrit 36.0 - 46.0 % 41.3 39.4 41.3  Platelets 150 - 400 K/uL 138(L) 144(L) 132(L)  ANC 700  CBC    Component Value Date/Time   WBC 6.6 08/24/2019 0840   RBC 4.45 08/24/2019 0840   HGB 14.1 08/24/2019 0840   HGB 8.5 (L) 08/20/2018 0741   HCT 41.3 08/24/2019 0840   PLT 138 (L) 08/24/2019 0840   PLT 195 08/20/2018 0741   MCV 92.8 08/24/2019 0840   MCH 31.7 08/24/2019 0840   MCHC 34.1 08/24/2019 0840   RDW 12.9 08/24/2019 0840   LYMPHSABS 2.6 08/24/2019 0840   MONOABS 0.6 08/24/2019 0840   EOSABS 0.1 08/24/2019 0840   BASOSABS 0.0 08/24/2019 0840    . CMP Latest Ref Rng & Units 08/24/2019 08/13/2019 06/11/2019  Glucose 70 - 99 mg/dL 261(H) 168(H) 143(H)  BUN 8 - 23 mg/dL 10 6(L) 9  Creatinine 0.44 - 1.00 mg/dL 0.76 0.73 0.71  Sodium 135 - 145 mmol/L 138 142 138  Potassium 3.5 - 5.1 mmol/L 4.1 4.3 3.9  Chloride 98 - 111 mmol/L 102 104 104  CO2 22 - 32 mmol/L '26 27 25  ' Calcium 8.9 - 10.3 mg/dL 9.6 9.6 9.9  Total Protein 6.5 - 8.1 g/dL 6.9 - 7.2  Total Bilirubin 0.3 - 1.2 mg/dL 0.5 - 0.5  Alkaline Phos 38 - 126 U/L 81 - 94  AST 15 - 41 U/L 27 - 30  ALT 0 - 44 U/L 55(H) - 53(H)    . Lab Results  Component Value Date   LDH 174 08/24/2019      Component     Latest Ref Rng & Units 04/18/2018 05/03/2018  Hepatitis B Surface Ag     Negative  Negative  HCV Ab     0.0 - 0.9 s/co ratio  <0.1  Hep  A Ab, IgM     Negative  Negative  Hep B Core Ab, IgM     Negative  Negative  HIV Screen 4th Generation wRfx     Non Reactive Non Reactive     04/21/18 Molecular Pathology:   04/21/18 Surgical Pathology:    RADIOGRAPHIC STUDIES: I have personally reviewed the radiological images as listed and agreed with the findings in the report. Ct Chest W Contrast  Result Date:  08/13/2019 CLINICAL DATA:  Angioimmunoblastic lymphoma diagnosed in 2019. Restaging. EXAM: CT CHEST, ABDOMEN, AND PELVIS WITH CONTRAST TECHNIQUE: Multidetector CT imaging of the chest, abdomen and pelvis was performed following the standard protocol during bolus administration of intravenous contrast. CONTRAST:  111m OMNIPAQUE IOHEXOL 300 MG/ML  SOLN COMPARISON:  PET-CT 09/24/2018 FINDINGS: CT CHEST FINDINGS Cardiovascular: Coronary, aortic arch, and branch vessel atherosclerotic vascular disease. Mediastinum/Nodes: Densely calcified right thyroid nodule on image 5/2, formerly 2.7 by 1.8 cm and previously 2.7 by 1.9 cm. Right hilar lymph node 0.9 cm in short axis on image 24/2, roughly similar on PET-CT from 09/24/2018. No overtly pathologically enlarged thoracic adenopathy is currently observed. There is some coarse calcifications along the glandular tissues of the right breast, stable. Lungs/Pleura: Centrilobular emphysema. Mild central airway thickening. Faint sub solid nodularity medially in the apicoposterior segment left upper lobe for example on image 22/4, individual nodules up to 0.4 cm, probably inflammatory Musculoskeletal: Unremarkable CT ABDOMEN PELVIS FINDINGS Hepatobiliary: Unremarkable Pancreas: Unremarkable Spleen: Unremarkable Adrenals/Urinary Tract: 0.5 cm hypodense lesion of the right kidney upper pole is likely a small cyst but technically too small to characterize. Adrenal glands unremarkable. Urinary bladder unremarkable. Stomach/Bowel: Unremarkable Vascular/Lymphatic: Aortoiliac atherosclerotic vascular disease. 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 No current pathologic adenopathy. Reproductive: Uterus absent.  Adnexa unremarkable. Other: Subtle stranding in the central mesentery, likely from previously treated lymphoma. No overt adenopathy in the mesentery. Musculoskeletal: No supplemental non-categorized findings.  IMPRESSION: 1. No recurrent adenopathy to suggest active malignancy. 2. Subtle ground-glass nodularity medially at the left lung apex, likely inflammatory. 3. Other imaging findings of potential clinical significance: Aortic Atherosclerosis (ICD10-I70.0). Coronary atherosclerosis. Emphysema (ICD10-J43.9). Airway thickening is present, suggesting bronchitis or reactive airways disease. Stable calcified right thyroid nodule. 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 Electronically Signed   By: WVan ClinesM.D.   On: 08/13/2019 10:23   Ct Abdomen Pelvis W Contrast  Result Date: 08/13/2019 CLINICAL DATA:  Angioimmunoblastic lymphoma diagnosed in 2019. Restaging. EXAM: CT CHEST, ABDOMEN, AND PELVIS WITH CONTRAST TECHNIQUE: Multidetector CT imaging of the chest, abdomen and pelvis was performed following the standard protocol during bolus administration of intravenous contrast. CONTRAST:  102mOMNIPAQUE IOHEXOL 300 MG/ML  SOLN COMPARISON:  PET-CT 09/24/2018 FINDINGS: CT CHEST FINDINGS Cardiovascular: Coronary, aortic arch, and branch vessel atherosclerotic vascular disease. Mediastinum/Nodes: Densely calcified right thyroid nodule on image 5/2, formerly 2.7 by 1.8 cm and previously 2.7 by 1.9 cm. Right hilar lymph node 0.9 cm in short axis on image 24/2, roughly similar on PET-CT from 09/24/2018. No overtly pathologically enlarged thoracic adenopathy is currently observed. There is some coarse calcifications along the glandular tissues of the right breast, stable. Lungs/Pleura: Centrilobular emphysema. Mild central airway thickening. Faint sub solid nodularity medially in the apicoposterior segment left upper lobe for example on image 22/4, individual nodules up to 0.4 cm, probably inflammatory Musculoskeletal: Unremarkable CT ABDOMEN PELVIS FINDINGS Hepatobiliary: Unremarkable Pancreas: Unremarkable Spleen: Unremarkable Adrenals/Urinary Tract: 0.5 cm hypodense lesion of the  right  kidney upper pole is likely a small cyst but technically too small to characterize. Adrenal glands unremarkable. Urinary bladder unremarkable. Stomach/Bowel: Unremarkable Vascular/Lymphatic: Aortoiliac atherosclerotic vascular disease. 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 No current pathologic adenopathy. Reproductive: Uterus absent.  Adnexa unremarkable. Other: Subtle stranding in the central mesentery, likely from previously treated lymphoma. No overt adenopathy in the mesentery. Musculoskeletal: No supplemental non-categorized findings. IMPRESSION: 1. No recurrent adenopathy to suggest active malignancy. 2. Subtle ground-glass nodularity medially at the left lung apex, likely inflammatory. 3. Other imaging findings of potential clinical significance: Aortic Atherosclerosis (ICD10-I70.0). Coronary atherosclerosis. Emphysema (ICD10-J43.9). Airway thickening is present, suggesting bronchitis or reactive airways disease. Stable calcified right thyroid nodule. 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 Electronically Signed   By: Van Clines M.D.   On: 08/13/2019 10:23    ASSESSMENT & PLAN:   63 y.o. female with  1. 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 S/P C1 OF CHOP on 05/08/2018 S/p C2 of Brentuximab-CHP on 05/28/2018  S/p C3 of Brentuximab-CHP on 06/18/2018 S/p C4 of Brentuximab-CHP on  07/09/2018 S/p C5 of Brentuximab-CHP on  07/30/2018  07/01/18 PET/CT revealed Resolution of axillary, mediastinal, periaortic, and inguinal lymphadenopathy. ( [place] 1) 2. Decrease in size of spleen.  Normal metabolic activity. 3. Uniform increase in marrow metabolic activity is most consistent with GCSF type response.   09/24/18 PET/CT revealed No evidence of residual or recurrent hypermetabolic lymphoma. ([place] 1). 2. Age advanced coronary  artery atherosclerosis. Recommend assessment of coronary risk factors and consideration of medical therapy. 3. Aortic atherosclerosis and emphysema.  Discussed the option to pursue BM transplant after completing treatment and that I would be happy to refer the pt to [facility] for a discussion with a transplant team - this was done but not possible due to lack of insurance. Then referred pt to [facility], and this also was not possible due to lack of insurance.  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  2. General LNadenopathy from lymphoma with b/l pleural effusion and b/l lower extremity weakness- resolved  3. Anemia/thrombocytopenia - from Bone marrow involvement by lymphoma + Chemotherapy BM Bx confirms involved by T cell lymphoma. Anemia now resolved. Mild stable thrombocytopenia  4. 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 LDH wnl no evidnece of overt hemolysis at this timne   5. 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) CXR 6/21 s/p rpt therapeutic thoracentesis on 05/11/2018 - resolved on PET/CT  6 .h/o RUE DVT - US 05/13/2018  PLAN: -Discussed pt labwork, 08/24/19; all values are WNL except for 138K, Glucose at 261, ALT at 55. -Discussed 08/24/2019 LDH at 174 -Discussed 08/24/2019 MMP is as follows: all values are WNL -Discussed 08/13/2019  CT C/A/P ([id number]) which revealed "1. No recurrent adenopathy to suggest active malignancy. 2. Subtle ground-glass nodularity medially at the left lung apex, likely inflammatory. 3. Other imaging findings of potential clinical significance: Aortic Atherosclerosis (ICD10-I70.0). Coronary atherosclerosis. Emphysema (ICD10-J43.9). Airway thickening is present, suggesting bronchitis or reactive airways disease. Stable calcified right thyroid nodule. 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 -Did previously refer pt to Social Work (has contact information) for continued aid in getting insurance towards being able to qualify for auto HSCT. 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 -Will see back in 3 months with labs  FOLLOW UP: RTC with Dr Irene Limbo with labs in 3 months  The total time spent in the appt was 15 minutes and more than 50% was on counseling and direct patient cares.  All of the patient's questions were answered with apparent satisfaction. The patient knows to call the clinic with any problems, questions or concerns.    Sullivan Lone MD [place] AAHIVMS [facility] Hematology/Oncology Physician [employer]  (Office):       [phone] (Work cell):  [phone] (Fax):           [phone]  I, Yevette Edwards, am acting as a scribe for Dr. Sullivan Lone.   .I have reviewed the above documentation for accuracy and completeness, and I agree with the above. Brunetta Genera MD

## 2019-08-31 ENCOUNTER — Inpatient Hospital Stay (HOSPITAL_BASED_OUTPATIENT_CLINIC_OR_DEPARTMENT_OTHER): Payer: Self-pay | Admitting: Hematology

## 2019-08-31 DIAGNOSIS — I10 Essential (primary) hypertension: Secondary | ICD-10-CM

## 2019-08-31 DIAGNOSIS — C8440 Peripheral T-cell lymphoma, not classified, unspecified site: Secondary | ICD-10-CM

## 2019-08-31 DIAGNOSIS — F17210 Nicotine dependence, cigarettes, uncomplicated: Secondary | ICD-10-CM

## 2019-08-31 DIAGNOSIS — D696 Thrombocytopenia, unspecified: Secondary | ICD-10-CM

## 2019-08-31 DIAGNOSIS — C865 Angioimmunoblastic T-cell lymphoma: Secondary | ICD-10-CM

## 2019-09-01 ENCOUNTER — Telehealth: Payer: Self-pay | Admitting: Hematology

## 2019-09-01 NOTE — Telephone Encounter (Signed)
Scheduled appt per 10/12 los. ° °Sent a staff message to get a calendar mailed out. °

## 2019-09-10 ENCOUNTER — Other Ambulatory Visit: Payer: Self-pay | Admitting: *Deleted

## 2019-09-10 DIAGNOSIS — D696 Thrombocytopenia, unspecified: Secondary | ICD-10-CM

## 2019-09-10 DIAGNOSIS — C8440 Peripheral T-cell lymphoma, not classified, unspecified site: Secondary | ICD-10-CM

## 2019-09-11 ENCOUNTER — Inpatient Hospital Stay: Payer: Self-pay

## 2019-09-11 ENCOUNTER — Inpatient Hospital Stay: Payer: Self-pay | Admitting: Hematology

## 2019-11-24 IMAGING — DX DG CHEST 1V
1 series · 1 of 1 positions shown · non-contrast
Comparison: 05/11/2018 chest radiograph.

CLINICAL DATA: 61 y/o F; status post intubation. Recent
thoracentesis.

EXAM:
CHEST  1 VIEW

[chest ap]
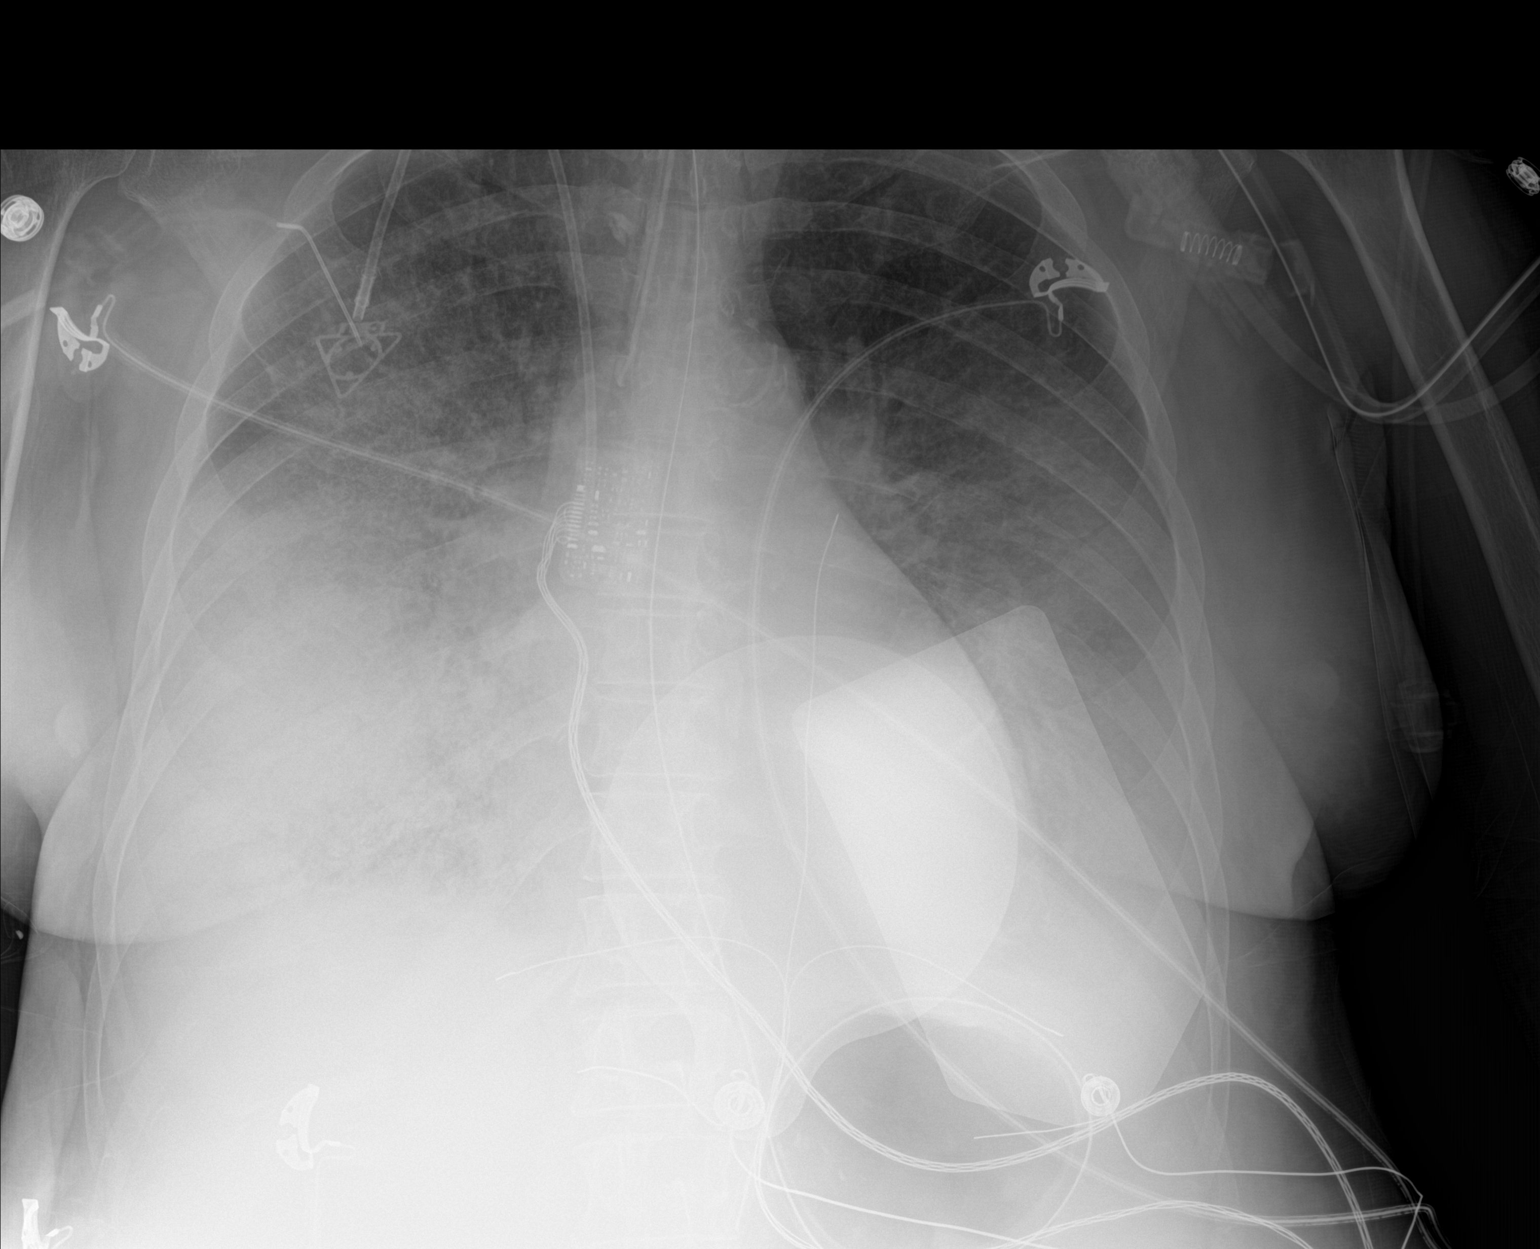

[1 of 1 positions shown; findings below may reference images not displayed]

FINDINGS: Stable right port catheter tip projecting over mid SVC. Endotracheal
tube tip projects 2 cm above carina. Enteric tube tip extends below
field of view into the abdomen. Transcutaneous pacing pads noted.
Increased diffuse hazy opacities of the lungs and stable basilar
consolidations. No pneumothorax. Bilateral pleural effusions. Bones
are unremarkable. No pneumothorax.
IMPRESSION: 1. Endotracheal tube tip 2 cm above carina.
2. Enteric tube tip below field of view in the abdomen.
3. Increased diffuse hazy opacities of the lungs and stable basilar
consolidation. Bilateral effusions.

By: Chiemena Giza M.D.

## 2019-11-24 IMAGING — US US THORACENTESIS ASP PLEURAL SPACE W/IMG GUIDE
1 series · 8 of 8 positions shown · non-contrast
Comparison: Chest CT-04/18/2018; chest radiograph-05/09/2018

MEDICATIONS:
None.

COMPLICATIONS:
None immediate.

INDICATION: Symptomatic right sided pleural effusion. Please perform
ultrasound-guided thoracentesis for therapeutic purposes.

EXAM:
US THORACENTESIS ASP PLEURAL SPACE W/IMG GUIDE
TECHNIQUE: Informed written consent was obtained from the patient after a
discussion of the risks, benefits and alternatives to treatment. A
timeout was performed prior to the initiation of the procedure.

[Series 1: us thoracentesis asp pleural space w/img guide · 8 of 8 slices shown]
[im 1/8]
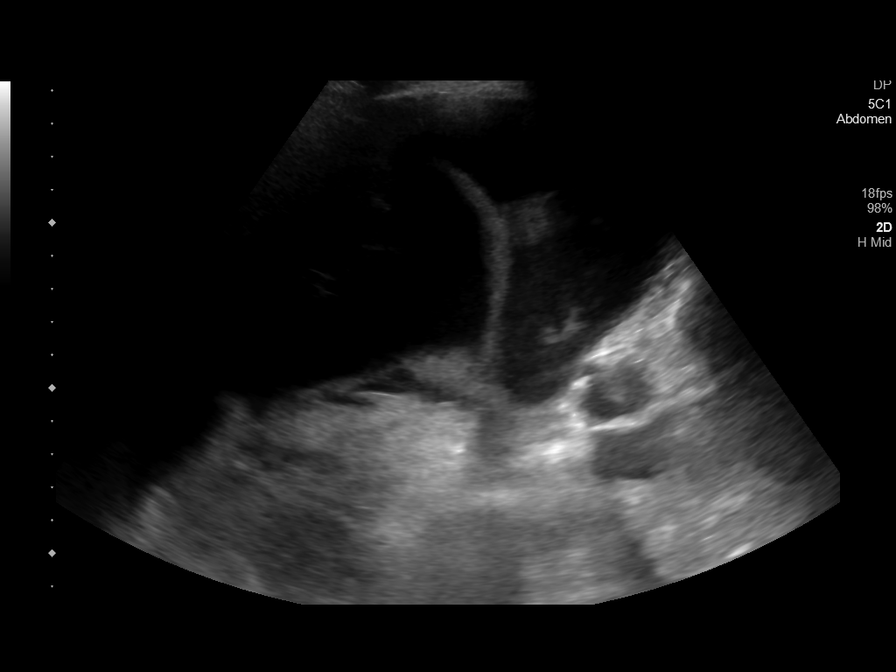
[im 2/8]
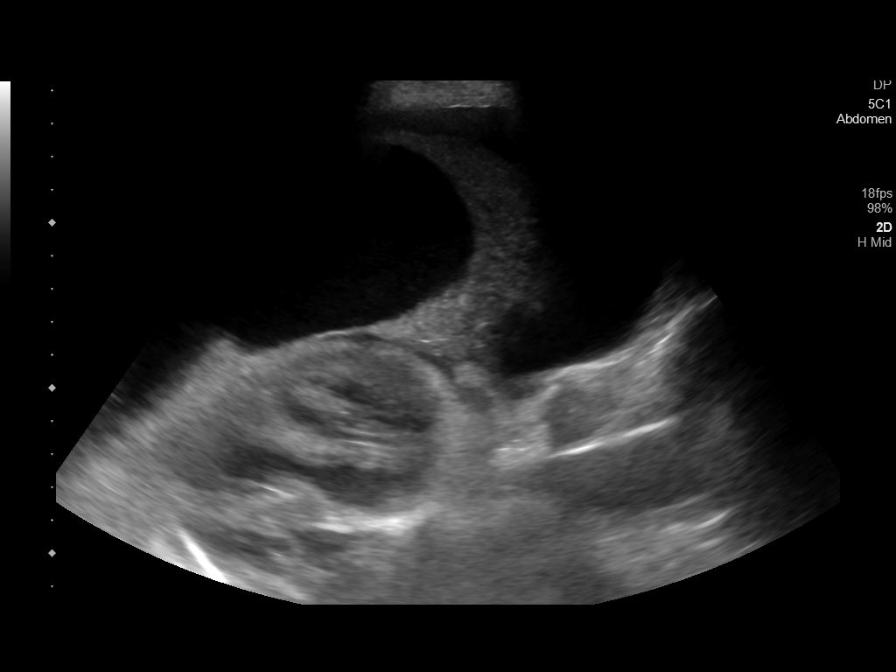
[im 3/8]
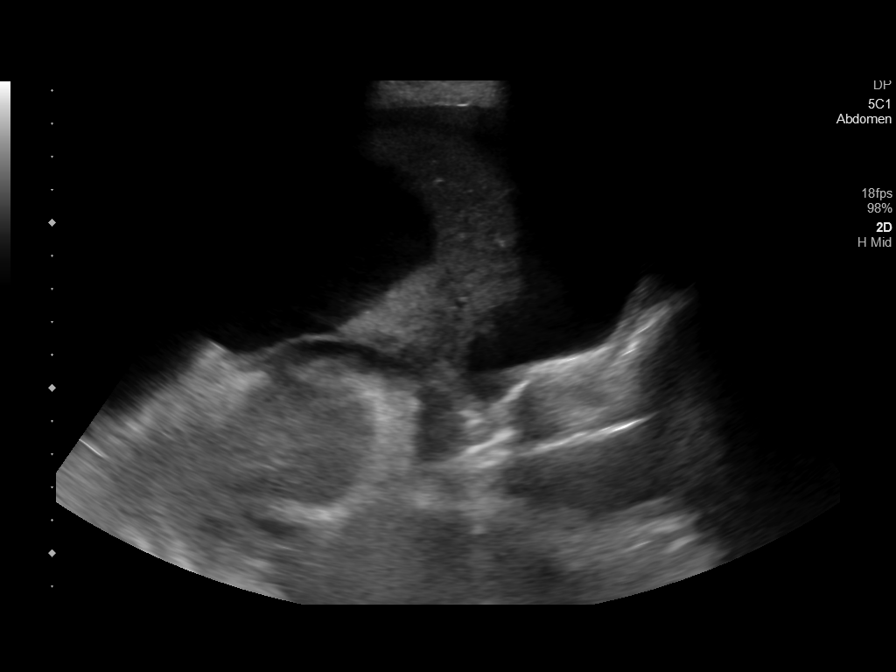
[im 4/8]
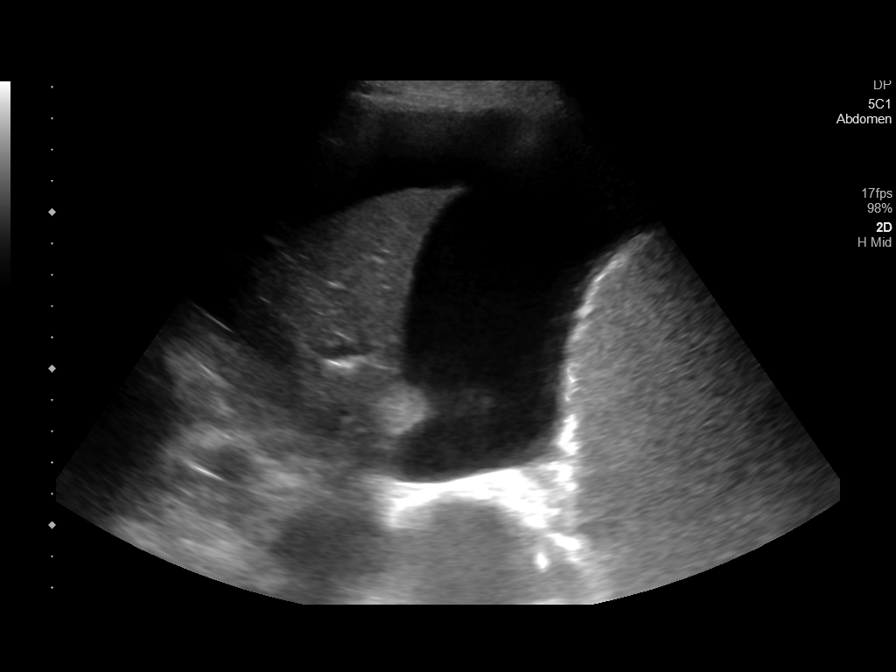
[im 5/8]
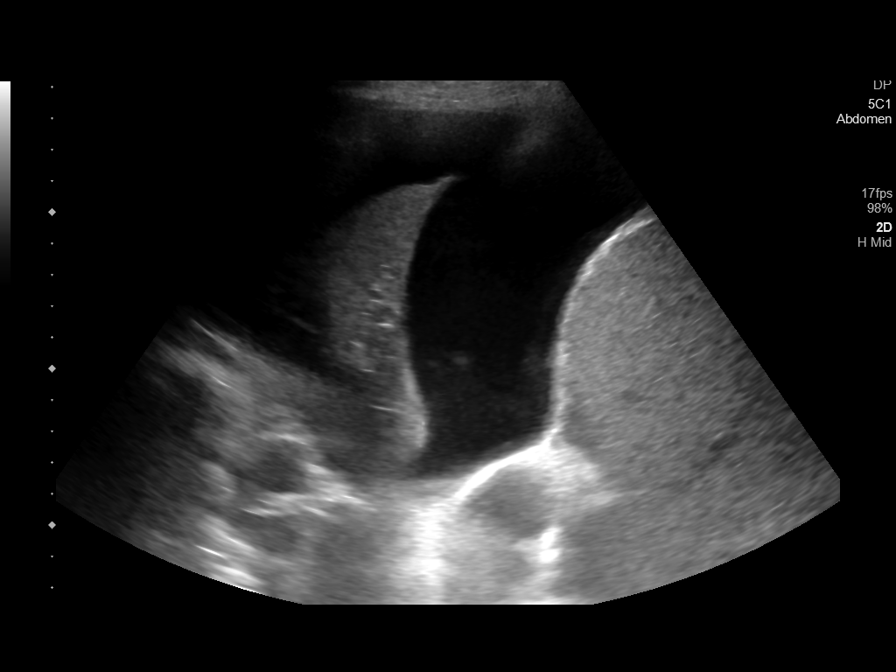
[im 6/8]
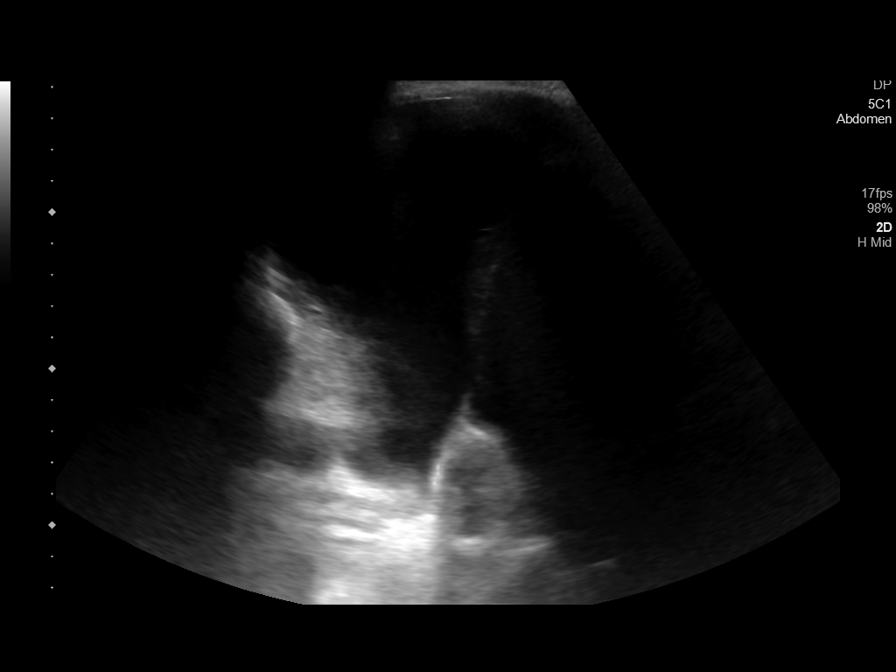
[im 7/8]
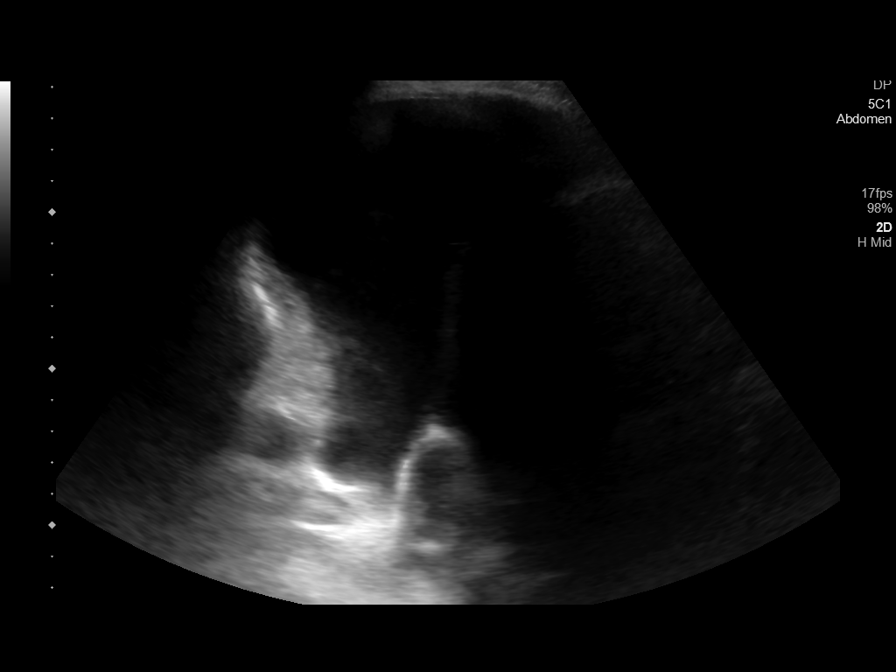
[im 8/8]
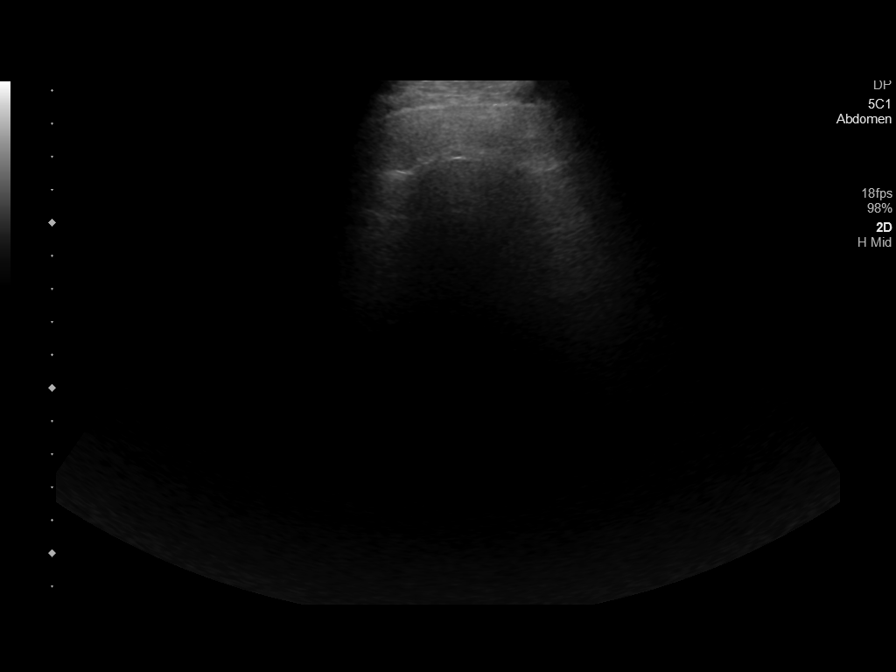

[8 of 8 positions shown; findings below may reference images not displayed]

Initial ultrasound scanning demonstrates a large anechoic
right-sided pleural effusion. The lower chest was prepped and draped
in the usual sterile fashion. 1% lidocaine was used for local
anesthesia. An ultrasound image was saved for documentation
purposes. An 8 Fr Safe-T-Centesis catheter was introduced. The
thoracentesis was performed. The catheter was removed and a dressing
was applied. The patient tolerated the procedure well without
immediate post procedural complication. The patient was escorted to
have an upright chest radiograph.
FINDINGS: A total of approximately 1.7 liters of serous fluid was removed.
IMPRESSION: Successful ultrasound-guided right sided thoracentesis yielding
liters of pleural fluid.

## 2019-11-24 IMAGING — DX DG CHEST 1V
1 series · 1 of 1 positions shown · non-contrast
Comparison: 05/09/2018

CLINICAL DATA: Post right-sided thoracentesis

EXAM:
CHEST  1 VIEW

[chest pa]
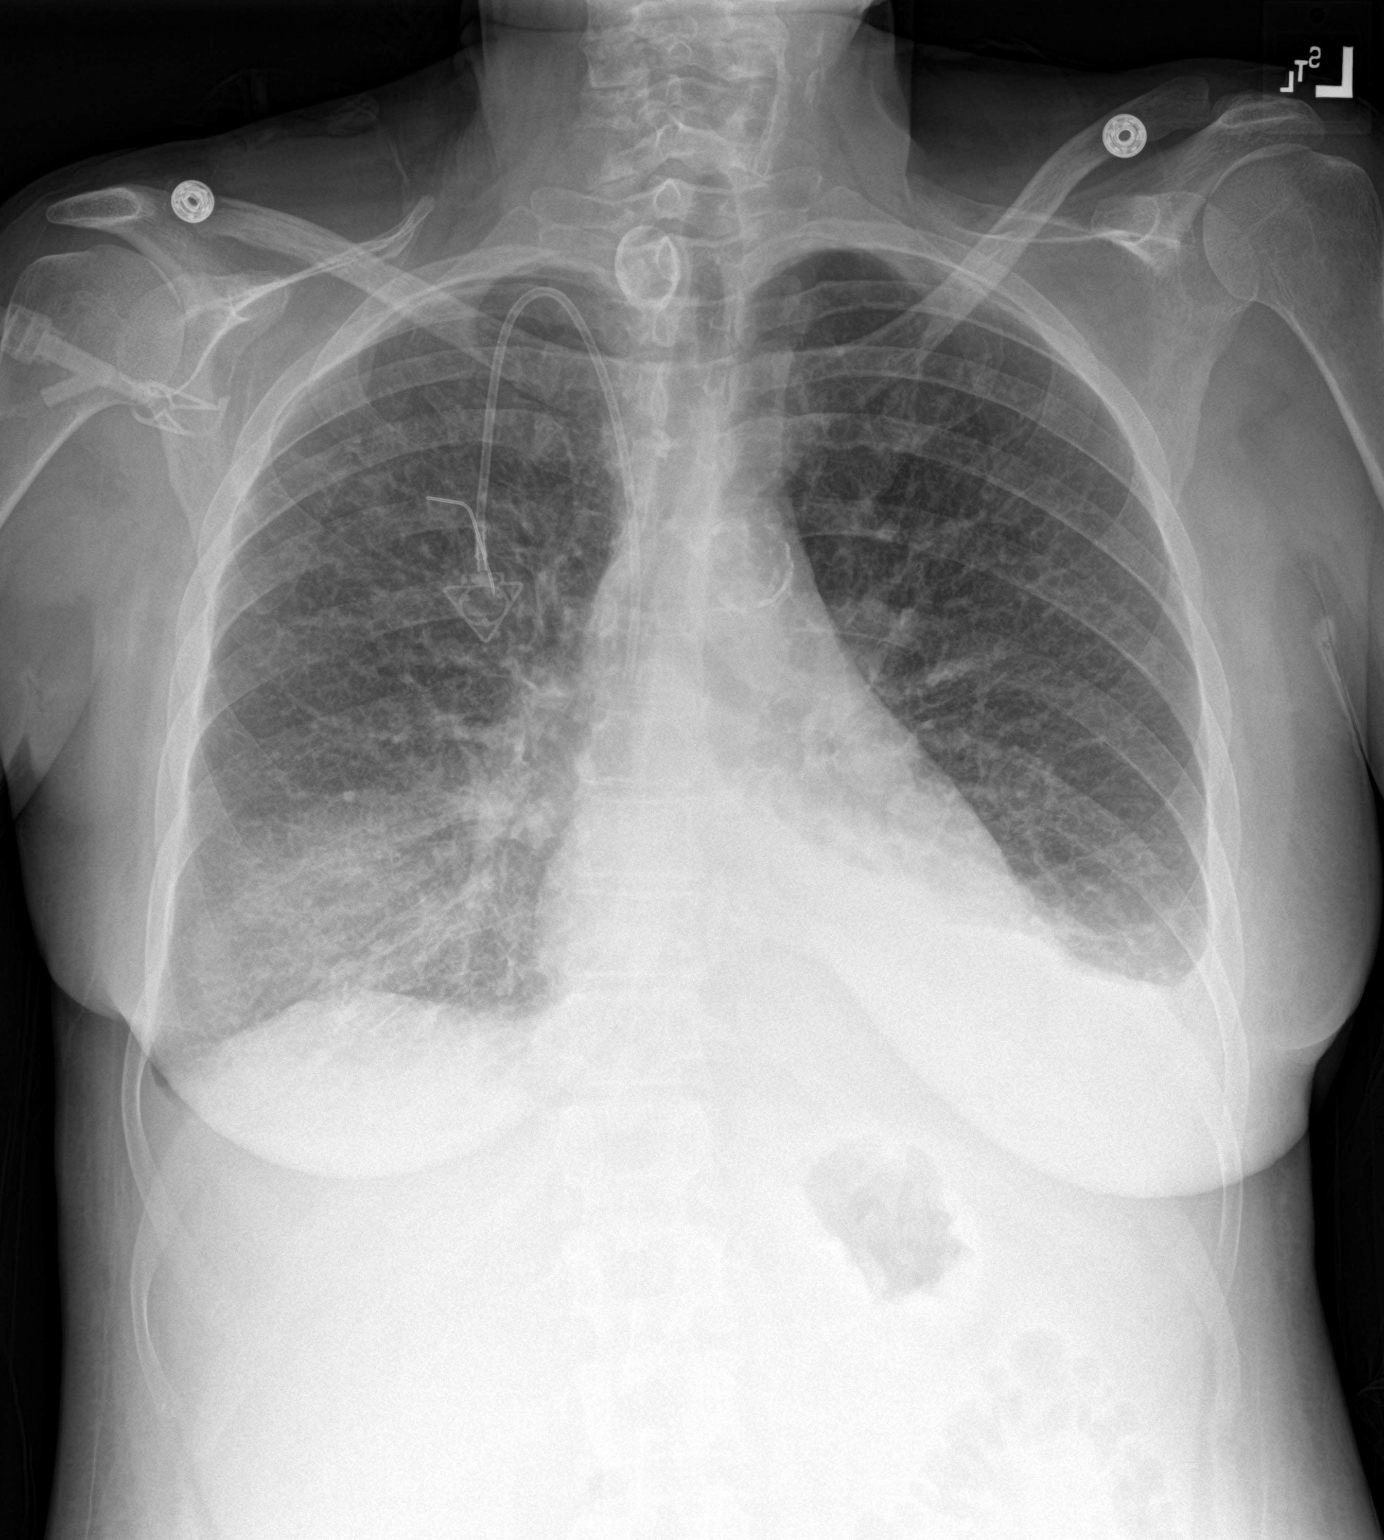

[1 of 1 positions shown; findings below may reference images not displayed]

FINDINGS: Interval reduction/near resolution of residual trace right-sided
effusion post thoracentesis. No pneumothorax.

No change to slight increase in small to moderate size left-sided
effusion.

Improved aeration of lung base with persistent right basilar
heterogeneous opacities. Unchanged left basilar consolidative
opacities. Pulmonary vasculature remains indistinct with
cephalization of flow and nodular thickening of the pulmonary
interstitium.

Grossly unchanged cardiac silhouette and mediastinal contours with
atherosclerotic plaque within the thoracic aorta. Calcification
overlying the right-sided the thoracic inlet is unchanged and
compatible with known peripherally calcified thyroid nodule. Stable
position of support apparatus. No acute osseus abnormalities.
IMPRESSION: 1. Interval reduction/near resolution of residual trace effusion
post thoracentesis. No pneumothorax.
2. No change to slight increase in small to moderate size left-sided
pleural effusion.
3. Overall improved aeration of lungs with persistent findings of
pulmonary venous congestion/mild pulmonary edema.

## 2019-11-27 IMAGING — DX DG CHEST 1V PORT
1 series · 1 of 1 positions shown · non-contrast
Comparison: 05/13/2018

CLINICAL DATA: Pleural effusion

EXAM:
PORTABLE CHEST 1 VIEW

[chest ap]
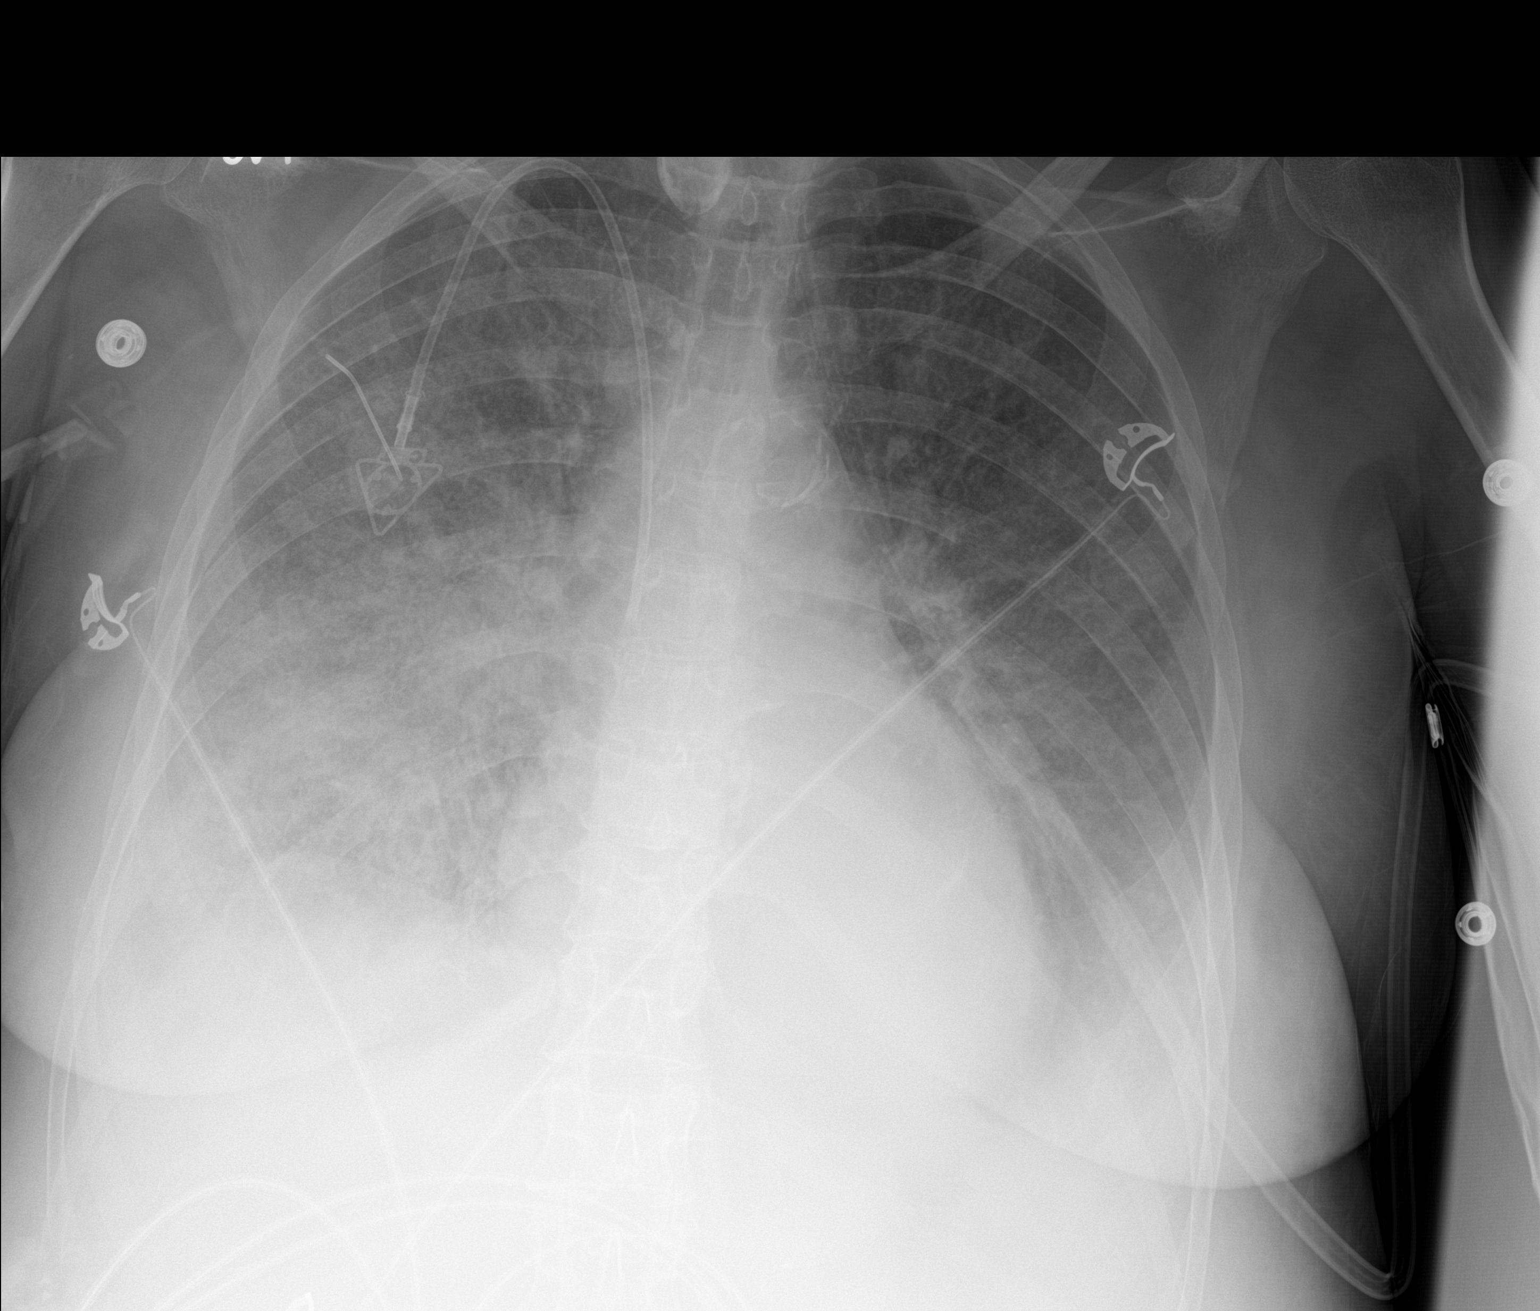

[1 of 1 positions shown; findings below may reference images not displayed]

FINDINGS: Cardiac shadow is stable. Endotracheal tube and nasogastric catheter
have been removed in the interval. Right chest wall port is again
seen. Thyroid calcifications are noted. Bilateral pleural effusions
and basilar infiltrate is seen right greater than left relatively
stable from the prior exam. No acute bony abnormality is noted.
IMPRESSION: Bibasilar changes right greater than left relatively stable from the
prior study.

## 2019-12-02 ENCOUNTER — Other Ambulatory Visit: Payer: Self-pay

## 2019-12-02 ENCOUNTER — Inpatient Hospital Stay (HOSPITAL_BASED_OUTPATIENT_CLINIC_OR_DEPARTMENT_OTHER): Payer: Self-pay | Admitting: Hematology

## 2019-12-02 ENCOUNTER — Inpatient Hospital Stay: Payer: Self-pay | Attending: Hematology

## 2019-12-02 VITALS — BP 127/55 | HR 93 | Temp 98.0°F | Resp 18 | Ht 61.81 in | Wt 136.4 lb

## 2019-12-02 DIAGNOSIS — C8440 Peripheral T-cell lymphoma, not classified, unspecified site: Secondary | ICD-10-CM

## 2019-12-02 DIAGNOSIS — C865 Angioimmunoblastic T-cell lymphoma: Secondary | ICD-10-CM | POA: Insufficient documentation

## 2019-12-02 DIAGNOSIS — D649 Anemia, unspecified: Secondary | ICD-10-CM | POA: Insufficient documentation

## 2019-12-02 DIAGNOSIS — D696 Thrombocytopenia, unspecified: Secondary | ICD-10-CM

## 2019-12-02 LAB — LACTATE DEHYDROGENASE: LDH: 146 U/L (ref 98–192)

## 2019-12-02 LAB — CBC WITH DIFFERENTIAL/PLATELET
Abs Immature Granulocytes: 0.02 10*3/uL (ref 0.00–0.07)
Basophils Absolute: 0 10*3/uL (ref 0.0–0.1)
Basophils Relative: 0 %
Eosinophils Absolute: 0.1 10*3/uL (ref 0.0–0.5)
Eosinophils Relative: 1 %
HCT: 41.4 % (ref 36.0–46.0)
Hemoglobin: 14.3 g/dL (ref 12.0–15.0)
Immature Granulocytes: 0 %
Lymphocytes Relative: 46 %
Lymphs Abs: 4.3 10*3/uL — ABNORMAL HIGH (ref 0.7–4.0)
MCH: 32 pg (ref 26.0–34.0)
MCHC: 34.5 g/dL (ref 30.0–36.0)
MCV: 92.6 fL (ref 80.0–100.0)
Monocytes Absolute: 0.7 10*3/uL (ref 0.1–1.0)
Monocytes Relative: 8 %
Neutro Abs: 4.1 10*3/uL (ref 1.7–7.7)
Neutrophils Relative %: 45 %
Platelets: 141 10*3/uL — ABNORMAL LOW (ref 150–400)
RBC: 4.47 MIL/uL (ref 3.87–5.11)
RDW: 12.9 % (ref 11.5–15.5)
WBC: 9.3 10*3/uL (ref 4.0–10.5)
nRBC: 0 % (ref 0.0–0.2)

## 2019-12-02 LAB — CMP (CANCER CENTER ONLY)
ALT: 43 U/L (ref 0–44)
AST: 23 U/L (ref 15–41)
Albumin: 4.2 g/dL (ref 3.5–5.0)
Alkaline Phosphatase: 87 U/L (ref 38–126)
Anion gap: 10 (ref 5–15)
BUN: 13 mg/dL (ref 8–23)
CO2: 25 mmol/L (ref 22–32)
Calcium: 9.3 mg/dL (ref 8.9–10.3)
Chloride: 99 mmol/L (ref 98–111)
Creatinine: 0.94 mg/dL (ref 0.44–1.00)
GFR, Est AFR Am: >60 mL/min (ref >60)
GFR, Estimated: >60 mL/min (ref >60)
Glucose, Bld: 225 mg/dL — ABNORMAL HIGH (ref 70–99)
Potassium: 4.6 mmol/L (ref 3.5–5.1)
Sodium: 134 mmol/L — ABNORMAL LOW (ref 135–145)
Total Bilirubin: 0.4 mg/dL (ref 0.3–1.2)
Total Protein: 7.2 g/dL (ref 6.5–8.1)

## 2019-12-02 NOTE — Progress Notes (Signed)
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 female who has been referred to [place] by Dr Joni Reining for evaluation and management of her newly diagnosed angioimmunoblastic T cell lymphoma.   Patient has a h/o HTN, endometriosis, tobacco use who was admitted to the hospital with shortness of breath and was subsequently found to have diffuse lymphadenopathy. She was also found to have bilateral pleural effusions and has had a thoracentesis with pleural fluid demonstrating atypical lymphocytosis. She was discharged but returned to the ED with complaints of persisting SOB, abdominal swelling, and lower extremity swelling.   She has had a  CTA chest and CT abd/pelvis which showed no PE.but extensive LNadenopathy in the chest and throughout the abd with b/l pleural effusion and mild ascites. Also noted to have narrowing in b/l common iliac arteries.  Clinically she is noted to have b/l LE edema 1-2+  Most recent lab results (05/05/18) of CBC  is as follows: all values are WNL except for RBC at 2.59, HGB at 7.7, HCT at 22.7, RDW at 16.0, PLT at 89k.  She has had a CT bone marrow biopsy on 05/05/2018 -results pending. ECHO was done and showed  Systolic function was   normal. The estimated ejection fraction was in the range of 60%   to 65%. Wall motion was normal; there were no regional wall   motion abnormalities. Left ventricular diastolic function   parameters were normal.  Port a cath placement requested.  Patient was transferred to [facility] with her consent to receive C1 of chemotherapy as inpatient.  Interval History:  Deborah Cooley returns today for management and  evaluation of her Angioimmunoblastic T cell lymphoma. The patient's last visit with [place] was on 08/31/2019. The pt reports that she is doing well overall.  The pt reports that she has been feeling well and eating well. Pt is currently back to smoking nearly 1 ppd. She is not interested in receiving the COVID19 vaccine. Pt is concerned because it hurts her left shoulder to lift her arm above her head. She denies any heavy lifting or injury to the area.  Lab results today (12/02/19) of CBC w/diff and CMP is as follows: all values are WNL except for PLT at 141K, Lymphs Abs at 4.3K, Sodium at 134, Glucose at 225. 12/02/2019 LDH at 146  On review of systems, pt reports eating well, left shoulder pain and denies SOB, chest pain, abdominal pain, leg swelling and any other symptoms.   MEDICAL HISTORY:  Past Medical History:  Diagnosis Date  . Bilateral pleural effusion 04/17/2018  . Endometriosis   . Essential hypertension, benign   . Family history of adverse reaction to anesthesia    "sister stops breathing I think" (04/18/2018)  . Heart murmur   . Mixed hyperlipidemia   . Pneumonia 04/17/2018    SURGICAL HISTORY: Past Surgical History:  Procedure Laterality Date  . ABDOMINAL HYSTERECTOMY  1970s/1980s   for endometriosis  . Arthroscopic right shoulder surgery    . AXILLARY LYMPH NODE BIOPSY Left 04/21/2018   Procedure: LEFT AXILLARY LYMPH NODE BIOPSY;  Surgeon: Kieth Brightly, Arta Bruce, MD;  Location: [place];  Service: General;  Laterality: Left;  . CARPAL TUNNEL  RELEASE Right   . GANGLION CYST EXCISION Right   . IR IMAGING GUIDED PORT INSERTION  05/07/2018  . IR REMOVAL TUN ACCESS W/ PORT W/O FL MOD SED  12/23/2018  . MULTIPLE TOOTH EXTRACTIONS    . OOPHORECTOMY  1997  . PARTIAL HYSTERECTOMY  1984  . Right hand surgery    . SHOULDER OPEN ROTATOR CUFF REPAIR Right   . VESICOVAGINAL FISTULA CLOSURE W/ TAH      SOCIAL HISTORY: Social History   Socioeconomic History  . Marital status:  Married    Spouse name: Not on file  . Number of children: Not on file  . Years of education: Not on file  . Highest education level: Not on file  Occupational History  . Occupation: Social research officer, government    Comment: works at [employer]  . Smoking status: Current Every Day Smoker    Packs/day: 1.50    Years: 46.00    Pack years: 69.00    Types: Cigarettes  . Smokeless tobacco: Never Used  Substance and Sexual Activity  . Alcohol use: Not Currently    Comment: 04/18/2018 "quit years ago"  . Drug use: Not Currently    Comment: "back in my 70s"  . Sexual activity: Not Currently  Other Topics Concern  . Not on file  Social History Narrative   ** Merged History Encounter **       Patient has one son whom is healthy.   Has a live in boyfriend.   Social Determinants of Health   Financial Resource Strain:   . Difficulty of Paying Living Expenses: Not on file  Food Insecurity:   . Worried About Charity fundraiser in the Last Year: Not on file  . Ran Out of Food in the Last Year: Not on file  Transportation Needs:   . Lack of Transportation (Medical): Not on file  . Lack of Transportation (Non-Medical): Not on file  Physical Activity:   . Days of Exercise per Week: Not on file  . Minutes of Exercise per Session: Not on file  Stress:   . Feeling of Stress : Not on file  Social Connections:   . Frequency of Communication with Friends and Family: Not on file  . Frequency of Social Gatherings with Friends and Family: Not on file  . Attends Religious Services: Not on file  . Active Member of Clubs or Organizations: Not on file  . Attends Archivist Meetings: Not on file  . Marital Status: Not on file  Intimate Partner Violence:   . Fear of Current or Ex-Partner: Not on file  . Emotionally Abused: Not on file  . Physically Abused: Not on file  . Sexually Abused: Not on file    FAMILY HISTORY: Family History  Problem Relation Age of Onset  .  Diabetes Mother        age 61  . Heart attack Father        died age 85's  . Hypertension Father   . Cancer Sister        breast cancer diagonsed age 68 years  . Diabetes Other   . Heart disease Other        Female <55  . Arthritis Other   . Asthma Other   . Hyperlipidemia Other        several siblings  . Thyroid disease Sister        one sister with thyroid disease    ALLERGIES:  is allergic to ambien [zolpidem].  MEDICATIONS:  Current Outpatient Medications  Medication Sig Dispense Refill  . Multiple Vitamin (MULTIVITAMIN WITH MINERALS) TABS tablet Take 1 tablet by mouth daily. 30 tablet 0   No current facility-administered medications for this visit.    REVIEW OF SYSTEMS:   A 10+ POINT REVIEW OF SYSTEMS WAS OBTAINED including neurology, dermatology, psychiatry, cardiac, respiratory, lymph, extremities, GI, GU, Musculoskeletal, constitutional, breasts, reproductive, HEENT.  All pertinent positives are noted in the HPI.  All others are negative.   PHYSICAL EXAMINATION: ECOG PERFORMANCE STATUS: 2 - Symptomatic, <50% confined to bed  .BP (!) 127/55 (BP Location: Left Arm, Patient Position: Sitting)   Pulse 93   Temp 98 F (36.7 C) (Temporal)   Resp 18   Ht 5' 1.81" (1.57 m)   Wt 136 lb 6.4 oz (61.9 kg)   SpO2 98%   BMI 25.10 kg/m   GENERAL:alert, in no acute distress and comfortable SKIN: no acute rashes, no significant lesions EYES: conjunctiva are pink and non-injected, sclera anicteric OROPHARYNX: MMM, no exudates, no oropharyngeal erythema or ulceration NECK: supple, no JVD LYMPH:  no palpable lymphadenopathy in the cervical, axillary or inguinal regions LUNGS: clear to auscultation b/l with normal respiratory effort HEART: regular rate & rhythm ABDOMEN:  normoactive bowel sounds , non tender, not distended. No palpable hepatosplenomegaly.  Extremity: no pedal edema PSYCH: alert & oriented x 3 with fluent speech NEURO: no focal motor/sensory  deficits  LABORATORY DATA:  I have reviewed the data as listed  . CBC Latest Ref Rng & Units 12/02/2019 08/24/2019 06/11/2019  WBC 4.0 - 10.5 K/uL 9.3 6.6 8.1  Hemoglobin 12.0 - 15.0 g/dL 14.3 14.1 13.7  Hematocrit 36.0 - 46.0 % 41.4 41.3 39.4  Platelets 150 - 400 K/uL 141(L) 138(L) 144(L)  ANC 700  CBC    Component Value Date/Time   WBC 9.3 12/02/2019 1329   RBC 4.47 12/02/2019 1329   HGB 14.3 12/02/2019 1329   HGB 8.5 (L) 08/20/2018 0741   HCT 41.4 12/02/2019 1329   PLT 141 (L) 12/02/2019 1329   PLT 195 08/20/2018 0741   MCV 92.6 12/02/2019 1329   MCH 32.0 12/02/2019 1329   MCHC 34.5 12/02/2019 1329   RDW 12.9 12/02/2019 1329   LYMPHSABS 4.3 (H) 12/02/2019 1329   MONOABS 0.7 12/02/2019 1329   EOSABS 0.1 12/02/2019 1329   BASOSABS 0.0 12/02/2019 1329    . CMP Latest Ref Rng & Units 12/02/2019 08/24/2019 08/13/2019  Glucose 70 - 99 mg/dL 225(H) 261(H) 168(H)  BUN 8 - 23 mg/dL 13 10 6(L)  Creatinine 0.44 - 1.00 mg/dL 0.94 0.76 0.73  Sodium 135 - 145 mmol/L 134(L) 138 142  Potassium 3.5 - 5.1 mmol/L 4.6 4.1 4.3  Chloride 98 - 111 mmol/L 99 102 104  CO2 22 - 32 mmol/L _0 Calcium 8.9 - 10.3 mg/dL 9.3 9.6 9.6  Total Protein 6.5 - 8.1 g/dL 7.2 6.9 -  Total Bilirubin 0.3 - 1.2 mg/dL 0.4 0.5 -  Alkaline Phos 38 - 126 U/L 87 81 -  AST 15 - 41 U/L 23 27 -  ALT 0 - 44 U/L 43 55(H) -    . Lab Results  Component Value Date   LDH 146 12/02/2019      Component     Latest Ref Rng & Units 04/18/2018 05/03/2018  Hepatitis B Surface Ag     Negative  Negative  HCV Ab     0.0 - 0.9 s/co  ratio  <0.1  Hep A Ab, IgM     Negative  Negative  Hep B Core Ab, IgM     Negative  Negative  HIV Screen 4th Generation wRfx     Non Reactive Non Reactive     04/21/18 Molecular Pathology:   04/21/18 Surgical Pathology:    RADIOGRAPHIC STUDIES: I have personally reviewed the radiological images as listed and agreed with the findings in the report. No results found.  ASSESSMENT  & PLAN:   64 y.o. female with  1. 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 S/P C1 OF CHOP on 05/08/2018 S/p C2 of Brentuximab-CHP on 05/28/2018  S/p C3 of Brentuximab-CHP on 06/18/2018 S/p C4 of Brentuximab-CHP on  07/09/2018 S/p C5 of Brentuximab-CHP on  07/30/2018  07/01/18 PET/CT revealed Resolution of axillary, mediastinal, periaortic, and inguinal lymphadenopathy. ( [place] 1) 2. Decrease in size of spleen.  Normal metabolic activity. 3. Uniform increase in marrow metabolic activity is most consistent with GCSF type response.   09/24/18 PET/CT revealed No evidence of residual or recurrent hypermetabolic lymphoma. ([place] 1). 2. 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 Recommend assessment of coronary risk factors and consideration of medical therapy. 3. Aortic atherosclerosis and emphysema.  Discussed the option to pursue BM transplant after completing treatment and that I would be happy to refer the pt to [facility] for a discussion with a transplant team - this was done but not possible due to lack of insurance. Then referred pt to [facility], and this also was not possible due to lack of insurance.  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  08/13/2019 CT C/A/P ([id number]) revealed "1. No recurrent adenopathy to suggest active malignancy. 2. Subtle ground-glass nodularity medially at the left lung apex, likely inflammatory. 3. Other imaging findings of potential clinical significance: Aortic Atherosclerosis (ICD10-I70.0). Coronary atherosclerosis. Emphysema (ICD10-J43.9). Airway thickening is present, suggesting bronchitis or reactive airways disease. Stable calcified right thyroid nodule. 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 General LNadenopathy from lymphoma with b/l pleural effusion and b/l lower extremity weakness- resolved  3.  Anemia/thrombocytopenia - from Bone marrow involvement by lymphoma + Chemotherapy BM Bx confirms involved by T cell lymphoma. Anemia now resolved. Mild stable thrombocytopenia  4. 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 LDH wnl no evidnece of overt hemolysis at this timne   5. 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) CXR 6/21 s/p rpt therapeutic thoracentesis on 05/11/2018 - resolved on PET/CT  6 .h/o RUE DVT - US 05/13/2018  PLAN: -Discussed pt labwork today, 12/02/19; blood counts are nml, blood chemistries are stable, blood glucose is high -Discussed 12/02/2019 LDH is WNL -08/24/2019 M Protein is "Not Observed"  -The pt shows no clinical or lab progression/return of her angioimmunoblastic T-cell lymphoma at this time. -Recommend pt set up primary care as quickly as possible to r/o Diabetes due to high blood sugar  -Recommended pt eat a well balanced diet including fresh fruits and vegetables  -Advised pt that she may have arthritic changes in her left shoulder - discomfort should be addressed by PCP -Recommended pt receive COVID19 vaccine when available  -Continued to recommend total smoking cessation -Will get a rpt CT C/A/P in 15 weeks with labs -Will see back in 4 months     FOLLOW UP: CT chest/abd/pevis in 15 weeks with labs RTC with Dr Irene Limbo in 16 weeks   The total  time spent in the appt was 20 minutes and more than 50% was on counseling and direct patient cares.  All of the patient's questions were answered with apparent satisfaction. The patient knows to call the clinic with any problems, questions or concerns.    Sullivan Lone MD [place] AAHIVMS [facility] Hematology/Oncology Physician [facility]  (Office):       [phone] (Work cell):  [phone] (Fax):           [phone]  I, Yevette Edwards, am acting as a scribe for Dr. Sullivan Lone.   .I have reviewed the above documentation for accuracy and completeness, and I agree with the  above. Brunetta Genera MD

## 2019-12-03 ENCOUNTER — Telehealth: Payer: Self-pay | Admitting: Hematology

## 2019-12-03 NOTE — Telephone Encounter (Signed)
Scheduled appt per 1/13 los.  Sent a message to HIM pool to get a calendar mailed out. 

## 2020-01-14 IMAGING — PT NM PET TUM IMG RESTAG (PS) SKULL BASE T - THIGH
1 of 8 series · 1 of 25 positions shown · non-contrast
Comparison: CT 04/18/2018

CLINICAL DATA: Subsequent treatment strategy for T-cell lymphoma.
Three cycles of chemotherapy completed.. Initial presentation with
lymphadenopathy and pleural effusions.

EXAM:
NUCLEAR MEDICINE PET SKULL BASE TO THIGH
TECHNIQUE: 111 mCi F-18 FDG was injected intravenously. Full-ring PET imaging
was performed from the skull base to thigh after the radiotracer. CT
data was obtained and used for attenuation correction and anatomic
localization.
Fasting blood glucose: 5.1 mg/dl

[Series 4: ct sk_thigh 5.0 b31f · axial · 5.0mm · 0.98mm/px · 1 of 220 slices shown]
[im 220/220  brain]
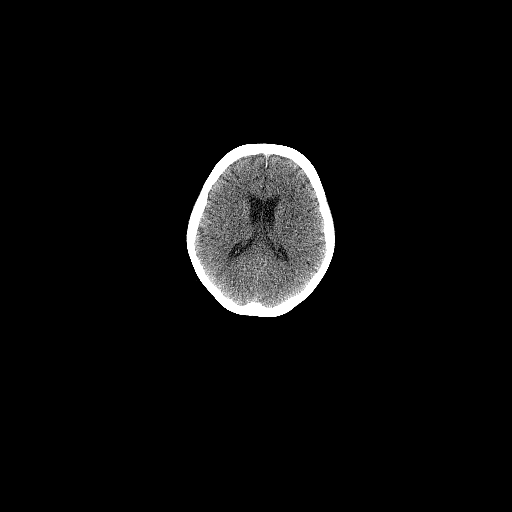

[1 of 25 positions shown; findings below may reference images not displayed]

FINDINGS: Mediastinal blood pool activity: SUV max

NECK: No hypermetabolic lymph nodes in the neck.

Incidental CT findings: none

CHEST: Near complete resolution of bulky RIGHT axillary adenopathy.
No hypermetabolic lymph nodes remain. Mild activity in the LEFT
axilla likely represents excisional biopsy site. Activity is low
with SUV max 1.5.

Resolution of mediastinal adenopathy. No hypermetabolic mediastinal
lymph nodes ( [HOSPITAL] 1

Incidental CT findings: Port in the anterior chest wall with tip in
distal SVC.. Resolution of pleural effusions.

ABDOMEN/PELVIS: Decrease in size of periaortic lymph nodes. For
example lymph node LEFT aorta the level the kidneys measuring 11 mm
short axis decreased from 21 mm. No associated metabolic activity.

Resolution of the iliac adenopathy. No residual metabolic activity.
Likewise resolution of inguinal adenopathy. No residual metabolic
activity.

The spleen is decreased in size measuring 10.5 cm in craniocaudad
dimension compared to 12.3 cm.

Incidental CT findings: Post hysterectomy. Atherosclerotic
calcification of the aorta.

SKELETON: uniform increase in bone marrow activity particularly in
the spine.

Incidental CT findings: none
IMPRESSION: 1. Resolution of axillary, mediastinal, periaortic, and inguinal
lymphadenopathy. ( [HOSPITAL] 1)
2. Decrease in size of spleen.  Normal metabolic activity.
3. Uniform increase in marrow metabolic activity is most consistent
with GCSF type response.

## 2020-03-16 ENCOUNTER — Inpatient Hospital Stay: Payer: Self-pay | Attending: Hematology

## 2020-03-16 ENCOUNTER — Other Ambulatory Visit: Payer: Self-pay

## 2020-03-16 DIAGNOSIS — D696 Thrombocytopenia, unspecified: Secondary | ICD-10-CM

## 2020-03-16 DIAGNOSIS — C8440 Peripheral T-cell lymphoma, not classified, unspecified site: Secondary | ICD-10-CM

## 2020-03-16 DIAGNOSIS — C865 Angioimmunoblastic T-cell lymphoma: Secondary | ICD-10-CM | POA: Insufficient documentation

## 2020-03-16 LAB — CBC WITH DIFFERENTIAL (CANCER CENTER ONLY)
Abs Immature Granulocytes: 0.03 10*3/uL (ref 0.00–0.07)
Basophils Absolute: 0 10*3/uL (ref 0.0–0.1)
Basophils Relative: 0 %
Eosinophils Absolute: 0.1 10*3/uL (ref 0.0–0.5)
Eosinophils Relative: 1 %
HCT: 41.9 % (ref 36.0–46.0)
Hemoglobin: 14 g/dL (ref 12.0–15.0)
Immature Granulocytes: 0 %
Lymphocytes Relative: 46 %
Lymphs Abs: 3.4 10*3/uL (ref 0.7–4.0)
MCH: 31 pg (ref 26.0–34.0)
MCHC: 33.4 g/dL (ref 30.0–36.0)
MCV: 92.7 fL (ref 80.0–100.0)
Monocytes Absolute: 0.5 10*3/uL (ref 0.1–1.0)
Monocytes Relative: 7 %
Neutro Abs: 3.3 10*3/uL (ref 1.7–7.7)
Neutrophils Relative %: 46 %
Platelet Count: 137 10*3/uL — ABNORMAL LOW (ref 150–400)
RBC: 4.52 MIL/uL (ref 3.87–5.11)
RDW: 13 % (ref 11.5–15.5)
WBC Count: 7.2 10*3/uL (ref 4.0–10.5)
nRBC: 0 % (ref 0.0–0.2)

## 2020-03-16 LAB — CMP (CANCER CENTER ONLY)
ALT: 40 U/L (ref 0–44)
AST: 21 U/L (ref 15–41)
Albumin: 3.9 g/dL (ref 3.5–5.0)
Alkaline Phosphatase: 78 U/L (ref 38–126)
Anion gap: 10 (ref 5–15)
BUN: 10 mg/dL (ref 8–23)
CO2: 24 mmol/L (ref 22–32)
Calcium: 9.5 mg/dL (ref 8.9–10.3)
Chloride: 102 mmol/L (ref 98–111)
Creatinine: 0.84 mg/dL (ref 0.44–1.00)
GFR, Est AFR Am: >60 mL/min (ref >60)
GFR, Estimated: >60 mL/min (ref >60)
Glucose, Bld: 378 mg/dL — ABNORMAL HIGH (ref 70–99)
Potassium: 4 mmol/L (ref 3.5–5.1)
Sodium: 136 mmol/L (ref 135–145)
Total Bilirubin: 0.3 mg/dL (ref 0.3–1.2)
Total Protein: 7 g/dL (ref 6.5–8.1)

## 2020-03-16 LAB — LACTATE DEHYDROGENASE: LDH: 143 U/L (ref 98–192)

## 2020-03-23 ENCOUNTER — Other Ambulatory Visit: Payer: Self-pay

## 2020-03-23 ENCOUNTER — Inpatient Hospital Stay: Payer: Self-pay | Attending: Hematology | Admitting: Hematology

## 2020-03-23 VITALS — BP 139/57 | HR 98 | Temp 98.2°F | Resp 18 | Ht 61.81 in | Wt 135.3 lb

## 2020-03-23 DIAGNOSIS — F17210 Nicotine dependence, cigarettes, uncomplicated: Secondary | ICD-10-CM | POA: Insufficient documentation

## 2020-03-23 DIAGNOSIS — E782 Mixed hyperlipidemia: Secondary | ICD-10-CM | POA: Insufficient documentation

## 2020-03-23 DIAGNOSIS — I10 Essential (primary) hypertension: Secondary | ICD-10-CM | POA: Insufficient documentation

## 2020-03-23 DIAGNOSIS — R600 Localized edema: Secondary | ICD-10-CM | POA: Insufficient documentation

## 2020-03-23 DIAGNOSIS — C865 Angioimmunoblastic T-cell lymphoma: Secondary | ICD-10-CM | POA: Insufficient documentation

## 2020-03-23 DIAGNOSIS — R21 Rash and other nonspecific skin eruption: Secondary | ICD-10-CM | POA: Insufficient documentation

## 2020-03-23 DIAGNOSIS — D696 Thrombocytopenia, unspecified: Secondary | ICD-10-CM

## 2020-03-23 DIAGNOSIS — C8440 Peripheral T-cell lymphoma, not classified, unspecified site: Secondary | ICD-10-CM

## 2020-03-23 MED ORDER — TRIAMCINOLONE ACETONIDE 0.5 % EX OINT
1.0000 | TOPICAL_OINTMENT | Freq: Two times a day (BID) | CUTANEOUS | 0 refills | Status: DC
Start: 2020-03-23 — End: 2020-08-09

## 2020-03-23 NOTE — Progress Notes (Signed)
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 female who has been referred to [place] by Dr Joni Reining for evaluation and management of her newly diagnosed angioimmunoblastic T cell lymphoma.   Patient has a h/o HTN, endometriosis, tobacco use who was admitted to the hospital with shortness of breath and was subsequently found to have diffuse lymphadenopathy. She was also found to have bilateral pleural effusions and has had a thoracentesis with pleural fluid demonstrating atypical lymphocytosis. She was discharged but returned to the ED with complaints of persisting SOB, abdominal swelling, and lower extremity swelling.   She has had a  CTA chest and CT abd/pelvis which showed no PE.but extensive LNadenopathy in the chest and throughout the abd with b/l pleural effusion and mild ascites. Also noted to have narrowing in b/l common iliac arteries.  Clinically she is noted to have b/l LE edema 1-2+  Most recent lab results (05/05/18) of CBC  is as follows: all values are WNL except for RBC at 2.59, HGB at 7.7, HCT at 22.7, RDW at 16.0, PLT at 89k.  She has had a CT bone marrow biopsy on 05/05/2018 -results pending. ECHO was done and showed  Systolic function was   normal. The estimated ejection fraction was in the range of 60%   to 65%. Wall motion was normal; there were no regional wall   motion abnormalities. Left ventricular diastolic function   parameters were normal.  Port a cath placement requested.  Patient was transferred to [facility] with her consent to receive C1 of chemotherapy as inpatient.  Interval History:  Deborah Cooley returns today for management and  evaluation of her Angioimmunoblastic T cell lymphoma. We are joined today by her husband. The patient's last visit with [place] was on 12/02/2019. The pt reports that she is doing well overall.  The pt reports that she has a new rash on her forearms and chest. The rash is persistent and itchy sometimes. She has used Neosporin and Cortizone cream on the area with no improvement. At one point, the rash was also near her knees but resolved on it's own. Pt denies any recent medication changes, OTC supplements, or vaccinations. She has not set up primary care yet.   Lab results (03/16/20) of CBC w/diff and CMP is as follows: all values are WNL except for PLT at 137K, Glucose at 378 03/16/2020 LDH at 143  On review of systems, pt reports itchy rash and denies unexpected weight loss, SOB, fevers, chills, night sweats and any other symptoms.   MEDICAL HISTORY:  Past Medical History:  Diagnosis Date  . Bilateral pleural effusion 04/17/2018  . Endometriosis   . Essential hypertension, benign   . Family history of adverse reaction to anesthesia    "sister stops breathing I think" (04/18/2018)  . Heart murmur   . Mixed hyperlipidemia   . Pneumonia 04/17/2018    SURGICAL HISTORY: Past Surgical History:  Procedure Laterality Date  . ABDOMINAL HYSTERECTOMY  1970s/1980s   for endometriosis  . Arthroscopic right shoulder surgery    . AXILLARY LYMPH NODE BIOPSY Left 04/21/2018   Procedure: LEFT AXILLARY LYMPH NODE BIOPSY;  Surgeon: Kieth Brightly Arta Bruce, MD;  Location: [facility];  Service: General;  Laterality: Left;  . CARPAL TUNNEL RELEASE Right   . GANGLION CYST EXCISION Right   . IR IMAGING GUIDED PORT INSERTION  05/07/2018  . IR REMOVAL TUN ACCESS W/ PORT W/O FL MOD SED  12/23/2018  . MULTIPLE TOOTH EXTRACTIONS    . OOPHORECTOMY  1997  . PARTIAL HYSTERECTOMY  1984  . Right hand surgery    . SHOULDER OPEN ROTATOR CUFF REPAIR Right   . VESICOVAGINAL FISTULA CLOSURE W/ TAH      SOCIAL HISTORY: Social  History   Socioeconomic History  . Marital status: Married    Spouse name: Not on file  . Number of children: Not on file  . Years of education: Not on file  . Highest education level: Not on file  Occupational History  . Occupation: Social research officer, government    Comment: works at [employer]  . Smoking status: Current Every Day Smoker    Packs/day: 1.50    Years: 46.00    Pack years: 69.00    Types: Cigarettes  . Smokeless tobacco: Never Used  Substance and Sexual Activity  . Alcohol use: Not Currently    Comment: 04/18/2018 "quit years ago"  . Drug use: Not Currently    Comment: "back in my 46s"  . Sexual activity: Not Currently  Other Topics Concern  . Not on file  Social History Narrative   ** Merged History Encounter **       Patient has one son whom is healthy.   Has a live in boyfriend.   Social Determinants of Health   Financial Resource Strain:   . Difficulty of Paying Living Expenses:   Food Insecurity:   . Worried About Charity fundraiser in the Last Year:   . Arboriculturist in the Last Year:   Transportation Needs:   . Film/video editor (Medical):   Marland Kitchen Lack of Transportation (Non-Medical):   Physical Activity:   . Days of Exercise per Week:   . Minutes of Exercise per Session:   Stress:   . Feeling of Stress :   Social Connections:   . Frequency of Communication with Friends and Family:   . Frequency of Social Gatherings with Friends and Family:   . Attends Religious Services:   . Active Member of Clubs or Organizations:   . Attends Archivist Meetings:   Marland Kitchen Marital Status:   Intimate Partner Violence:   . Fear of Current or Ex-Partner:   . Emotionally Abused:   Marland Kitchen Physically Abused:   . Sexually Abused:     FAMILY HISTORY: Family History  Problem Relation Age of Onset  . Diabetes Mother        age 12  . Heart attack Father        died age 46's  . Hypertension Father   . Cancer Sister        breast cancer  diagonsed age 50 years  . Diabetes Other   . Heart disease Other        Female <55  . Arthritis Other   . Asthma Other   . Hyperlipidemia Other        several siblings  . Thyroid disease Sister        one sister with thyroid disease    ALLERGIES:  is allergic to [place] [zolpidem].  MEDICATIONS:  Current Outpatient Medications  Medication Sig Dispense Refill  . Multiple Vitamin (MULTIVITAMIN WITH MINERALS) TABS tablet Take 1  tablet by mouth daily. 30 tablet 0  . triamcinolone ointment (KENALOG) 0.5 % Apply 1 application topically 2 (two) times daily. To rash over the forearms and chest wall 30 g 0   No current facility-administered medications for this visit.    REVIEW OF SYSTEMS:   A 10+ POINT REVIEW OF SYSTEMS WAS OBTAINED including neurology, dermatology, psychiatry, cardiac, respiratory, lymph, extremities, GI, GU, Musculoskeletal, constitutional, breasts, reproductive, HEENT.  All pertinent positives are noted in the HPI.  All others are negative.   PHYSICAL EXAMINATION: ECOG PERFORMANCE STATUS: 2 - Symptomatic, <50% confined to bed  .BP (!) 139/57 (BP Location: Left Arm, Patient Position: Sitting)   Pulse 98   Temp 98.2 F (36.8 C) (Temporal)   Resp 18   Ht 5' 1.81" (1.57 m)   Wt 135 lb 4.8 oz (61.4 kg)   SpO2 95%   BMI 24.90 kg/m   GENERAL:alert, in no acute distress and comfortable SKIN: no significant lesions, maculopapular rash over both forearms and upper chest  EYES: conjunctiva are pink and non-injected, sclera anicteric OROPHARYNX: MMM, no exudates, no oropharyngeal erythema or ulceration NECK: supple, no JVD LYMPH:  no palpable lymphadenopathy in the cervical, axillary or inguinal regions LUNGS: clear to auscultation b/l with normal respiratory effort HEART: regular rate & rhythm ABDOMEN:  normoactive bowel sounds , non tender, not distended. No palpable hepatosplenomegaly.  Extremity: no pedal edema PSYCH: alert & oriented x 3 with fluent speech NEURO:  no focal motor/sensory deficits  LABORATORY DATA:  I have reviewed the data as listed  . CBC Latest Ref Rng & Units 03/16/2020 12/02/2019 08/24/2019  WBC 4.0 - 10.5 K/uL 7.2 9.3 6.6  Hemoglobin 12.0 - 15.0 g/dL 14.0 14.3 14.1  Hematocrit 36.0 - 46.0 % 41.9 41.4 41.3  Platelets 150 - 400 K/uL 137(L) 141(L) 138(L)  ANC 700  CBC    Component Value Date/Time   WBC 7.2 03/16/2020 1156   WBC 9.3 12/02/2019 1329   RBC 4.52 03/16/2020 1156   HGB 14.0 03/16/2020 1156   HCT 41.9 03/16/2020 1156   PLT 137 (L) 03/16/2020 1156   MCV 92.7 03/16/2020 1156   MCH 31.0 03/16/2020 1156   MCHC 33.4 03/16/2020 1156   RDW 13.0 03/16/2020 1156   LYMPHSABS 3.4 03/16/2020 1156   MONOABS 0.5 03/16/2020 1156   EOSABS 0.1 03/16/2020 1156   BASOSABS 0.0 03/16/2020 1156    . CMP Latest Ref Rng & Units 03/16/2020 12/02/2019 08/24/2019  Glucose 70 - 99 mg/dL 378(H) 225(H) 261(H)  BUN 8 - 23 mg/dL '10 13 10  ' Creatinine 0.44 - 1.00 mg/dL 0.84 0.94 0.76  Sodium 135 - 145 mmol/L 136 134(L) 138  Potassium 3.5 - 5.1 mmol/L 4.0 4.6 4.1  Chloride 98 - 111 mmol/L 102 99 102  CO2 22 - 32 mmol/L '24 25 26  ' Calcium 8.9 - 10.3 mg/dL 9.5 9.3 9.6  Total Protein 6.5 - 8.1 g/dL 7.0 7.2 6.9  Total Bilirubin 0.3 - 1.2 mg/dL 0.3 0.4 0.5  Alkaline Phos 38 - 126 U/L 78 87 81  AST 15 - 41 U/L '21 23 27  ' ALT 0 - 44 U/L 40 43 55(H)    . Lab Results  Component Value Date   LDH 143 03/16/2020      Component     Latest Ref Rng & Units 04/18/2018 05/03/2018  Hepatitis B Surface Ag     Negative  Negative  HCV Ab     0.0 - 0.9 s/co ratio  <0.1  Hep A Ab, IgM     Negative  Negative  Hep B Core Ab, IgM     Negative  Negative  HIV Screen 4th Generation wRfx     Non Reactive Non Reactive     04/21/18 Molecular Pathology:   04/21/18 Surgical Pathology:    RADIOGRAPHIC STUDIES: I have personally reviewed the radiological images as listed and agreed with the findings in the report. No results found.  ASSESSMENT & PLAN:    64 y.o. female with  1. 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 S/P C1 OF CHOP on 05/08/2018 S/p C2 of Brentuximab-CHP on 05/28/2018  S/p C3 of Brentuximab-CHP on 06/18/2018 S/p C4 of Brentuximab-CHP on  07/09/2018 S/p C5 of Brentuximab-CHP on  07/30/2018  07/01/18 PET/CT revealed Resolution of axillary, mediastinal, periaortic, and inguinal lymphadenopathy. ( [place] 1) 2. Decrease in size of spleen.  Normal metabolic activity. 3. Uniform increase in marrow metabolic activity is most consistent with GCSF type response.   09/24/18 PET/CT revealed No evidence of residual or recurrent hypermetabolic lymphoma. ([place] 1). 2. 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 Recommend assessment of coronary risk factors and consideration of medical therapy. 3. Aortic atherosclerosis and emphysema.  Discussed the option to pursue BM transplant after completing treatment and that I would be happy to refer the pt to [facility] for a discussion with a transplant team - this was done but not possible due to lack of insurance. Then referred pt to [facility], and this also was not possible due to lack of insurance.  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  08/13/2019 CT C/A/P ([id number]) revealed "1. No recurrent adenopathy to suggest active malignancy. 2. Subtle ground-glass nodularity medially at the left lung apex, likely inflammatory. 3. Other imaging findings of potential clinical significance: Aortic Atherosclerosis (ICD10-I70.0). Coronary atherosclerosis. Emphysema (ICD10-J43.9). Airway thickening is present, suggesting bronchitis or reactive airways disease. Stable calcified right thyroid nodule. 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 General LNadenopathy from lymphoma with b/l pleural effusion and b/l lower extremity weakness- resolved  3. Anemia/thrombocytopenia -  from Bone marrow involvement by lymphoma + Chemotherapy BM Bx confirms involved by T cell lymphoma. Anemia now resolved. Mild stable thrombocytopenia  4. 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 LDH wnl no evidnece of overt hemolysis at this timne   5. 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  -If rash does not resolve with antihistamines and topical steroids pt will contact Dermatologist for rash evaluation.  -Advised pt that Glucose of 378 is dangerously high. Could cause dehydration and pt could end up in critical care.  -Advised pt that she needs to set up primary care. Pt currently lives in [place] does not want a referral to an Endocrinologist at this time.  -Recommend pt get a fasting Hgb A1c with a PCP -Recommend pt take Claritin of Zyrtec -Rx Triamcinolone  -Will get repeat CT C/A/P in 1 week  -Will see back in 2 weeks via phone    FOLLOW UP: -CT chest/abd/pelvis in 1 week -phone visit with Dr Irene Limbo in 2 weeks -patient will setup  PCP in [place] for followup of high blood sugars  -patient will call for dermatology appointment locally in [place] to evaluate her rash.   The total time spent in the appt was 20 minutes and more than 50% was on counseling and direct patient cares.  All of the patient's questions were answered with apparent satisfaction. The patient knows to call the clinic with any problems, questions or concerns.    Sullivan Lone MD [place] AAHIVMS [facility] Hematology/Oncology Physician [facility]  (Office):       [phone] (Work cell):  [phone] (Fax):           [phone]  I, Yevette Edwards, am acting as a scribe for Dr.  Sullivan Lone.   .I have reviewed the above documentation for accuracy and completeness, and I agree with the above. Brunetta Genera MD

## 2020-03-24 ENCOUNTER — Telehealth: Payer: Self-pay | Admitting: Hematology

## 2020-03-24 NOTE — Telephone Encounter (Signed)
Scheduled per 05/05 los, patient has been called and notified.  

## 2020-04-01 ENCOUNTER — Ambulatory Visit (HOSPITAL_COMMUNITY)
Admission: RE | Admit: 2020-04-01 | Discharge: 2020-04-01 | Disposition: A | Discharge status: Home / Self Care | Payer: Self-pay | Source: Ambulatory Visit | Attending: Hematology | Admitting: Hematology

## 2020-04-01 ENCOUNTER — Encounter (HOSPITAL_COMMUNITY): Payer: Self-pay

## 2020-04-01 ENCOUNTER — Other Ambulatory Visit: Payer: Self-pay

## 2020-04-01 DIAGNOSIS — C8440 Peripheral T-cell lymphoma, not classified, unspecified site: Secondary | ICD-10-CM | POA: Insufficient documentation

## 2020-04-01 HISTORY — DX: Malignant (primary) neoplasm, unspecified: C80.1

## 2020-04-01 MED ORDER — IOHEXOL 9 MG/ML PO SOLN
1000.0000 mL | ORAL | Status: AC
  Administered 2020-04-01: 1000 mL via ORAL

## 2020-04-01 MED ORDER — IOHEXOL 300 MG/ML  SOLN
100.0000 mL | Freq: Once | INTRAMUSCULAR | Status: AC | PRN
  Administered 2020-04-01: 100 mL via INTRAVENOUS

## 2020-04-01 MED ORDER — SODIUM CHLORIDE (PF) 0.9 % IJ SOLN
INTRAMUSCULAR | Status: AC
  Filled 2020-04-01: qty 50

## 2020-04-01 MED ORDER — IOHEXOL 9 MG/ML PO SOLN
ORAL | Status: AC
  Filled 2020-04-01: qty 1000

## 2020-04-07 ENCOUNTER — Telehealth: Payer: Self-pay | Admitting: Hematology

## 2020-04-07 NOTE — Progress Notes (Signed)
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 female who has been referred to [place] by Dr Joni Reining for evaluation and management of her newly diagnosed angioimmunoblastic T cell lymphoma.   Patient has a h/o HTN, endometriosis, tobacco use who was admitted to the hospital with shortness of breath and was subsequently found to have diffuse lymphadenopathy. She was also found to have bilateral pleural effusions and has had a thoracentesis with pleural fluid demonstrating atypical lymphocytosis. She was discharged but returned to the ED with complaints of persisting SOB, abdominal swelling, and lower extremity swelling.   She has had a  CTA chest and CT abd/pelvis which showed no PE.but extensive LNadenopathy in the chest and throughout the abd with b/l pleural effusion and mild ascites. Also noted to have narrowing in b/l common iliac arteries.  Clinically she is noted to have b/l LE edema 1-2+  Most recent lab results (05/05/18) of CBC  is as follows: all values are WNL except for RBC at 2.59, HGB at 7.7, HCT at 22.7, RDW at 16.0, PLT at 89k.  She has had a CT bone marrow biopsy on 05/05/2018 -results pending. ECHO was done and showed  Systolic function was   normal. The estimated ejection fraction was in the range of 60%   to 65%. Wall motion was normal; there were no regional wall   motion abnormalities. Left ventricular diastolic function   parameters were normal.  Port a cath placement requested.  Patient was transferred to [facility] with her consent to receive C1 of chemotherapy as inpatient.  Interval History:  I connected with Deborah Cooley on 04/08/20 at  11:40 AM EDT and verified that I am speaking with the correct person using two identifiers.   I discussed the limitations, risks, security and privacy concerns of performing an evaluation and management service by telemedicine and the availability of in-person appointments. I also discussed with the patient that there may be a patient responsible charge related to this service. The patient expressed understanding and agreed to proceed.   Other persons participating in the visit and their role in the encounter:     -Dawayne Cirri, Medical Scribe   Patient's location: Home  Provider's location: [facility] returns today for management and evaluation of her Angioimmunoblastic T cell lymphoma. The patient's last visit with [place] was on 03/23/20. The pt reports that she is doing well overall.  The pt reports she is good. She does not drink alcohol. Pt wants to think about transplant referral.   Of note since the patient's last visit, pt has had CT Abdomen Pelvis w Contrast ([id number]) completed on 04/01/20 with results revealing "No findings suspicious for active lymphoma. Spleen is normal in size. Additional ancillary findings as above. Aortic Atherosclerosis (ICD10-I70.0)." Pt has had CT Chest w Contrast ([id number]) completed on 04/01/20 with results revealing "No findings suspicious for active lymphoma. Spleen is normal in size. Additional ancillary findings as above. Aortic Atherosclerosis (ICD10-I70.0)."   MEDICAL HISTORY:  Past Medical History:  Diagnosis Date  . angioimmunoblastic lymphoma dx'd 03/2018  . Bilateral pleural effusion 04/17/2018  . Endometriosis   . Essential hypertension, benign   .  Family history of adverse reaction to anesthesia    "sister stops breathing I think" (04/18/2018)  . Heart murmur   . Mixed hyperlipidemia   . Pneumonia 04/17/2018    SURGICAL HISTORY: Past Surgical History:  Procedure Laterality Date  . ABDOMINAL HYSTERECTOMY   1970s/1980s   for endometriosis  . Arthroscopic right shoulder surgery    . AXILLARY LYMPH NODE BIOPSY Left 04/21/2018   Procedure: LEFT AXILLARY LYMPH NODE BIOPSY;  Surgeon: Kieth Brightly, Arta Bruce, MD;  Location: [place];  Service: General;  Laterality: Left;  . CARPAL TUNNEL RELEASE Right   . GANGLION CYST EXCISION Right   . IR IMAGING GUIDED PORT INSERTION  05/07/2018  . IR REMOVAL TUN ACCESS W/ PORT W/O FL MOD SED  12/23/2018  . MULTIPLE TOOTH EXTRACTIONS    . OOPHORECTOMY  1997  . PARTIAL HYSTERECTOMY  1984  . Right hand surgery    . SHOULDER OPEN ROTATOR CUFF REPAIR Right   . VESICOVAGINAL FISTULA CLOSURE W/ TAH      SOCIAL HISTORY: Social History   Socioeconomic History  . Marital status: Married    Spouse name: Not on file  . Number of children: Not on file  . Years of education: Not on file  . Highest education level: Not on file  Occupational History  . Occupation: Social research officer, government    Comment: works at [employer]  . Smoking status: Current Every Day Smoker    Packs/day: 1.50    Years: 46.00    Pack years: 69.00    Types: Cigarettes  . Smokeless tobacco: Never Used  Substance and Sexual Activity  . Alcohol use: Not Currently    Comment: 04/18/2018 "quit years ago"  . Drug use: Not Currently    Comment: "back in my 45s"  . Sexual activity: Not Currently  Other Topics Concern  . Not on file  Social History Narrative   ** Merged History Encounter **       Patient has one son whom is healthy.   Has a live in boyfriend.   Social Determinants of Health   Financial Resource Strain:   . Difficulty of Paying Living Expenses:   Food Insecurity:   . Worried About Charity fundraiser in the Last Year:   . Arboriculturist in the Last Year:   Transportation Needs:   . Film/video editor (Medical):   Marland Kitchen Lack of Transportation (Non-Medical):   Physical Activity:   . Days of Exercise per Week:   . Minutes of Exercise per Session:   Stress:   .  Feeling of Stress :   Social Connections:   . Frequency of Communication with Friends and Family:   . Frequency of Social Gatherings with Friends and Family:   . Attends Religious Services:   . Active Member of Clubs or Organizations:   . Attends Archivist Meetings:   Marland Kitchen Marital Status:   Intimate Partner Violence:   . Fear of Current or Ex-Partner:   . Emotionally Abused:   Marland Kitchen Physically Abused:   . Sexually Abused:     FAMILY HISTORY: Family History  Problem Relation Age of Onset  . Diabetes Mother        age 42  . Heart attack Father        died age 30's  . Hypertension Father   . Cancer Sister        breast cancer diagonsed age 68 years  . Diabetes Other   .  Heart disease Other        Female <55  . Arthritis Other   . Asthma Other   . Hyperlipidemia Other        several siblings  . Thyroid disease Sister        one sister with thyroid disease    ALLERGIES:  is allergic to [place] [zolpidem].  MEDICATIONS:  Current Outpatient Medications  Medication Sig Dispense Refill  . Multiple Vitamin (MULTIVITAMIN WITH MINERALS) TABS tablet Take 1 tablet by mouth daily. 30 tablet 0  . triamcinolone ointment (KENALOG) 0.5 % Apply 1 application topically 2 (two) times daily. To rash over the forearms and chest wall 30 g 0   No current facility-administered medications for this visit.    REVIEW OF SYSTEMS:   A 10+ POINT REVIEW OF SYSTEMS WAS OBTAINED including neurology, dermatology, psychiatry, cardiac, respiratory, lymph, extremities, GI, GU, Musculoskeletal, constitutional, breasts, reproductive, HEENT.  All pertinent positives are noted in the HPI.  All others are negative.   PHYSICAL EXAMINATION: ECOG PERFORMANCE STATUS: 2 - Symptomatic, <50% confined to bed  .There were no vitals taken for this visit.  Telehealth Visit   LABORATORY DATA:  I have reviewed the data as listed  . CBC Latest Ref Rng & Units 03/16/2020 12/02/2019 08/24/2019  WBC 4.0 - 10.5 K/uL 7.2  9.3 6.6  Hemoglobin 12.0 - 15.0 g/dL 14.0 14.3 14.1  Hematocrit 36.0 - 46.0 % 41.9 41.4 41.3  Platelets 150 - 400 K/uL 137(L) 141(L) 138(L)  ANC 700  CBC    Component Value Date/Time   WBC 7.2 03/16/2020 1156   WBC 9.3 12/02/2019 1329   RBC 4.52 03/16/2020 1156   HGB 14.0 03/16/2020 1156   HCT 41.9 03/16/2020 1156   PLT 137 (L) 03/16/2020 1156   MCV 92.7 03/16/2020 1156   MCH 31.0 03/16/2020 1156   MCHC 33.4 03/16/2020 1156   RDW 13.0 03/16/2020 1156   LYMPHSABS 3.4 03/16/2020 1156   MONOABS 0.5 03/16/2020 1156   EOSABS 0.1 03/16/2020 1156   BASOSABS 0.0 03/16/2020 1156    . CMP Latest Ref Rng & Units 03/16/2020 12/02/2019 08/24/2019  Glucose 70 - 99 mg/dL 378(H) 225(H) 261(H)  BUN 8 - 23 mg/dL '10 13 10  ' Creatinine 0.44 - 1.00 mg/dL 0.84 0.94 0.76  Sodium 135 - 145 mmol/L 136 134(L) 138  Potassium 3.5 - 5.1 mmol/L 4.0 4.6 4.1  Chloride 98 - 111 mmol/L 102 99 102  CO2 22 - 32 mmol/L '24 25 26  ' Calcium 8.9 - 10.3 mg/dL 9.5 9.3 9.6  Total Protein 6.5 - 8.1 g/dL 7.0 7.2 6.9  Total Bilirubin 0.3 - 1.2 mg/dL 0.3 0.4 0.5  Alkaline Phos 38 - 126 U/L 78 87 81  AST 15 - 41 U/L '21 23 27  ' ALT 0 - 44 U/L 40 43 55(H)    . Lab Results  Component Value Date   LDH 143 03/16/2020      Component     Latest Ref Rng & Units 04/18/2018 05/03/2018  Hepatitis B Surface Ag     Negative  Negative  HCV Ab     0.0 - 0.9 s/co ratio  <0.1  Hep A Ab, IgM     Negative  Negative  Hep B Core Ab, IgM     Negative  Negative  HIV Screen 4th Generation wRfx     Non Reactive Non Reactive     04/21/18 Molecular Pathology:   04/21/18 Surgical Pathology:    RADIOGRAPHIC STUDIES:  I have personally reviewed the radiological images as listed and agreed with the findings in the report. CT Chest W Contrast  Result Date: 04/01/2020 CLINICAL DATA:  Angioimmunoblastic T-cell lymphoma, status post chemotherapy EXAM: CT CHEST, ABDOMEN, AND PELVIS WITH CONTRAST TECHNIQUE: Multidetector CT imaging of the  chest, abdomen and pelvis was performed following the standard protocol during bolus administration of intravenous contrast. CONTRAST:  119m OMNIPAQUE IOHEXOL 300 MG/ML  SOLN COMPARISON:  08/13/2019 FINDINGS: CT CHEST FINDINGS Cardiovascular: The heart is normal in size. No pericardial effusion. No evidence of thoracic aortic aneurysm. Atherosclerotic calcifications of the aortic arch. Mild coronary atherosclerosis of the LAD. Mediastinum/Nodes: No suspicious mediastinal, hilar, or axillary lymphadenopathy. Stable 2.6 cm right thyroid nodule (series 2/image 8), previously evaluated on thyroid ultrasound in 2012, unchanged. This has been documented on prior studies (ref: J Am Coll Radiol. 2015 Feb;12(2): 143-50). Lungs/Pleura: No suspicious pulmonary nodules. No focal consolidation. No pleural effusion or pneumothorax. Musculoskeletal: No focal osseous lesions. CT ABDOMEN PELVIS FINDINGS Hepatobiliary: Mildly nodular hepatic contour. No suspicious/enhancing hepatic lesions. Gallbladder is unremarkable. No intrahepatic or extrahepatic ductal dilatation. Pancreas: Within normal limits. Spleen: Spleen is normal in size. Adrenals/Urinary Tract: Adrenal glands are within normal limits. Kidneys are within normal limits.  No hydronephrosis. Trace nondependent gas in the low lying bladder, otherwise within normal limits. Stomach/Bowel: Stomach is within normal limits. No evidence of bowel obstruction. Normal appendix (series 2/image 99). No colonic wall thickening or inflammatory changes. Vascular/Lymphatic: No evidence of abdominal aortic aneurysm. Atherosclerotic calcifications of the abdominal aorta and branch vessels. No suspicious abdominopelvic lymphadenopathy. Reproductive: Status post hysterectomy.  No adnexal masses. Other: No abdominopelvic ascites. Musculoskeletal: Visualized osseous structures are within normal limits. IMPRESSION: No findings suspicious for active lymphoma. Spleen is normal in size. Additional  ancillary findings as above. Aortic Atherosclerosis (ICD10-I70.0). Electronically Signed   By: SJulian HyM.D.   On: 04/01/2020 17:04   CT Abdomen Pelvis W Contrast  Result Date: 04/01/2020 CLINICAL DATA:  Angioimmunoblastic T-cell lymphoma, status post chemotherapy EXAM: CT CHEST, ABDOMEN, AND PELVIS WITH CONTRAST TECHNIQUE: Multidetector CT imaging of the chest, abdomen and pelvis was performed following the standard protocol during bolus administration of intravenous contrast. CONTRAST:  1044mOMNIPAQUE IOHEXOL 300 MG/ML  SOLN COMPARISON:  08/13/2019 FINDINGS: CT CHEST FINDINGS Cardiovascular: The heart is normal in size. No pericardial effusion. No evidence of thoracic aortic aneurysm. Atherosclerotic calcifications of the aortic arch. Mild coronary atherosclerosis of the LAD. Mediastinum/Nodes: No suspicious mediastinal, hilar, or axillary lymphadenopathy. Stable 2.6 cm right thyroid nodule (series 2/image 8), previously evaluated on thyroid ultrasound in 2012, unchanged. This has been documented on prior studies (ref: J Am Coll Radiol. 2015 Feb;12(2): 143-50). Lungs/Pleura: No suspicious pulmonary nodules. No focal consolidation. No pleural effusion or pneumothorax. Musculoskeletal: No focal osseous lesions. CT ABDOMEN PELVIS FINDINGS Hepatobiliary: Mildly nodular hepatic contour. No suspicious/enhancing hepatic lesions. Gallbladder is unremarkable. No intrahepatic or extrahepatic ductal dilatation. Pancreas: Within normal limits. Spleen: Spleen is normal in size. Adrenals/Urinary Tract: Adrenal glands are within normal limits. Kidneys are within normal limits.  No hydronephrosis. Trace nondependent gas in the low lying bladder, otherwise within normal limits. Stomach/Bowel: Stomach is within normal limits. No evidence of bowel obstruction. Normal appendix (series 2/image 99). No colonic wall thickening or inflammatory changes. Vascular/Lymphatic: No evidence of abdominal aortic aneurysm.  Atherosclerotic calcifications of the abdominal aorta and branch vessels. No suspicious abdominopelvic lymphadenopathy. Reproductive: Status post hysterectomy.  No adnexal masses. Other: No abdominopelvic ascites. Musculoskeletal: Visualized osseous structures are within normal  limits. IMPRESSION: No findings suspicious for active lymphoma. Spleen is normal in size. Additional ancillary findings as above. Aortic Atherosclerosis (ICD10-I70.0). Electronically Signed   By: Julian Hy M.D.   On: 04/01/2020 17:04    ASSESSMENT & PLAN:   64 y.o. female with  1. 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 S/P C1 OF CHOP on 05/08/2018 S/p C2 of Brentuximab-CHP on 05/28/2018  S/p C3 of Brentuximab-CHP on 06/18/2018 S/p C4 of Brentuximab-CHP on  07/09/2018 S/p C5 of Brentuximab-CHP on  07/30/2018  07/01/18 PET/CT revealed Resolution of axillary, mediastinal, periaortic, and inguinal lymphadenopathy. ( [place] 1) 2. Decrease in size of spleen.  Normal metabolic activity. 3. Uniform increase in marrow metabolic activity is most consistent with GCSF type response.   09/24/18 PET/CT revealed No evidence of residual or recurrent hypermetabolic lymphoma. (Deauville 1). 2. 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 Recommend assessment of coronary risk factors and consideration of medical therapy. 3. Aortic atherosclerosis and emphysema.  Discussed the option to pursue BM transplant after completing treatment and that I would be happy to refer the pt to [facility] for a discussion with a transplant team - this was done but not possible due to lack of insurance. Then referred pt to [facility], and this also was not possible due to lack of insurance.  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  08/13/2019 CT C/A/P ([id number]) revealed "1. No recurrent adenopathy to suggest active malignancy. 2. Subtle ground-glass  nodularity medially at the left lung apex, likely inflammatory. 3. Other imaging findings of potential clinical significance: Aortic Atherosclerosis (ICD10-I70.0). Coronary atherosclerosis. Emphysema (ICD10-J43.9). Airway thickening is present, suggesting bronchitis or reactive airways disease. Stable calcified right thyroid nodule. 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 General LNadenopathy from lymphoma with b/l pleural effusion and b/l lower extremity weakness- resolved  3. Anemia/thrombocytopenia - from Bone marrow involvement by lymphoma + Chemotherapy BM Bx confirms involved by T cell lymphoma. Anemia now resolved. Mild stable thrombocytopenia  4. 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 LDH wnl no evidnece of overt hemolysis at this timne   5. 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) CXR 6/21 s/p rpt therapeutic thoracentesis on 05/11/2018 - resolved on PET/CT  6 .h/o RUE DVT - US 05/13/2018  PLAN: -Discussed 04/01/20 of CT Abdomen Pelvis w Contrast ([id number])  -Discussed 04/01/20 of CT Chest w Contrast ([id number]) -Pt is still in remission of Angioimmunoblastic T cell lymphoma -Last treatment was 07/2018 -nearly 2 years out -Advised on transplant referral as an option  -Will see back in 4 months then change to 6 month f/u's   FOLLOW UP: RTC with Dr Irene Limbo with labs in 4 months  The total time spent in the appt was 10 minutes and more than 50% was on counseling and direct patient cares.  All of the patient's questions were answered with apparent satisfaction. The patient knows to call the clinic with any problems, questions or concerns.  Sullivan Lone MD [place] AAHIVMS [facility] Hematology/Oncology Physician [facility]  (Office):       [phone] (Work cell):  [phone] (Fax):           [phone]  I, Dawayne Cirri am acting as a scribe for Dr. Sullivan Lone.   .I have reviewed the above documentation for  accuracy and completeness, and I agree with the above. Brunetta Genera MD

## 2020-04-08 ENCOUNTER — Inpatient Hospital Stay (HOSPITAL_BASED_OUTPATIENT_CLINIC_OR_DEPARTMENT_OTHER): Payer: Self-pay | Admitting: Hematology

## 2020-04-08 ENCOUNTER — Other Ambulatory Visit: Payer: Self-pay | Admitting: Hematology

## 2020-04-08 DIAGNOSIS — C8440 Peripheral T-cell lymphoma, not classified, unspecified site: Secondary | ICD-10-CM

## 2020-04-12 ENCOUNTER — Telehealth: Payer: Self-pay | Admitting: Hematology

## 2020-04-12 NOTE — Telephone Encounter (Signed)
Scheduled per los, called patient regarding upcoming appointments and no voicemail seems to be set up.

## 2020-08-09 ENCOUNTER — Other Ambulatory Visit: Payer: Self-pay

## 2020-08-09 ENCOUNTER — Inpatient Hospital Stay (HOSPITAL_BASED_OUTPATIENT_CLINIC_OR_DEPARTMENT_OTHER): Payer: Medicare Other | Admitting: Hematology

## 2020-08-09 ENCOUNTER — Inpatient Hospital Stay: Payer: Medicare Other | Attending: Hematology

## 2020-08-09 VITALS — BP 157/60 | HR 78 | Temp 97.9°F | Resp 18 | Ht 61.81 in | Wt 131.3 lb

## 2020-08-09 DIAGNOSIS — I10 Essential (primary) hypertension: Secondary | ICD-10-CM | POA: Insufficient documentation

## 2020-08-09 DIAGNOSIS — C865 Angioimmunoblastic T-cell lymphoma: Secondary | ICD-10-CM | POA: Insufficient documentation

## 2020-08-09 DIAGNOSIS — F17210 Nicotine dependence, cigarettes, uncomplicated: Secondary | ICD-10-CM | POA: Insufficient documentation

## 2020-08-09 DIAGNOSIS — C8440 Peripheral T-cell lymphoma, not classified, unspecified site: Secondary | ICD-10-CM | POA: Diagnosis not present

## 2020-08-09 DIAGNOSIS — D6959 Other secondary thrombocytopenia: Secondary | ICD-10-CM | POA: Diagnosis not present

## 2020-08-09 DIAGNOSIS — D6481 Anemia due to antineoplastic chemotherapy: Secondary | ICD-10-CM | POA: Diagnosis not present

## 2020-08-09 LAB — CBC WITH DIFFERENTIAL/PLATELET
Abs Immature Granulocytes: 0.02 10*3/uL (ref 0.00–0.07)
Basophils Absolute: 0 10*3/uL (ref 0.0–0.1)
Basophils Relative: 1 %
Eosinophils Absolute: 0.1 10*3/uL (ref 0.0–0.5)
Eosinophils Relative: 1 %
HCT: 42.2 % (ref 36.0–46.0)
Hemoglobin: 14.6 g/dL (ref 12.0–15.0)
Immature Granulocytes: 0 %
Lymphocytes Relative: 42 %
Lymphs Abs: 3.4 10*3/uL (ref 0.7–4.0)
MCH: 31.4 pg (ref 26.0–34.0)
MCHC: 34.6 g/dL (ref 30.0–36.0)
MCV: 90.8 fL (ref 80.0–100.0)
Monocytes Absolute: 0.7 10*3/uL (ref 0.1–1.0)
Monocytes Relative: 8 %
Neutro Abs: 3.9 10*3/uL (ref 1.7–7.7)
Neutrophils Relative %: 48 %
Platelets: 139 10*3/uL — ABNORMAL LOW (ref 150–400)
RBC: 4.65 MIL/uL (ref 3.87–5.11)
RDW: 12.9 % (ref 11.5–15.5)
WBC: 8.1 10*3/uL (ref 4.0–10.5)
nRBC: 0 % (ref 0.0–0.2)

## 2020-08-09 LAB — CMP (CANCER CENTER ONLY)
ALT: 28 U/L (ref 0–44)
AST: 22 U/L (ref 15–41)
Albumin: 4.1 g/dL (ref 3.5–5.0)
Alkaline Phosphatase: 71 U/L (ref 38–126)
Anion gap: 8 (ref 5–15)
BUN: 13 mg/dL (ref 8–23)
CO2: 24 mmol/L (ref 22–32)
Calcium: 9.9 mg/dL (ref 8.9–10.3)
Chloride: 104 mmol/L (ref 98–111)
Creatinine: 0.73 mg/dL (ref 0.44–1.00)
GFR, Est AFR Am: >60 mL/min (ref >60)
GFR, Estimated: >60 mL/min (ref >60)
Glucose, Bld: 135 mg/dL — ABNORMAL HIGH (ref 70–99)
Potassium: 4.3 mmol/L (ref 3.5–5.1)
Sodium: 136 mmol/L (ref 135–145)
Total Bilirubin: 0.5 mg/dL (ref 0.3–1.2)
Total Protein: 7.4 g/dL (ref 6.5–8.1)

## 2020-08-09 LAB — LACTATE DEHYDROGENASE: LDH: 183 U/L (ref 98–192)

## 2020-08-09 MED ORDER — TRIAMCINOLONE ACETONIDE 0.5 % EX OINT: 1.0000 "application " | TOPICAL_OINTMENT | Freq: Two times a day (BID) | CUTANEOUS | 0 refills | Status: DC

## 2020-08-09 NOTE — Progress Notes (Signed)
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 female who has been referred to [place] by Dr Joni Reining for evaluation and management of her newly diagnosed angioimmunoblastic T cell lymphoma.   Patient has a h/o HTN, endometriosis, tobacco use who was admitted to the hospital with shortness of breath and was subsequently found to have diffuse lymphadenopathy. She was also found to have bilateral pleural effusions and has had a thoracentesis with pleural fluid demonstrating atypical lymphocytosis. She was discharged but returned to the ED with complaints of persisting SOB, abdominal swelling, and lower extremity swelling.   She has had a  CTA chest and CT abd/pelvis which showed no PE.but extensive LNadenopathy in the chest and throughout the abd with b/l pleural effusion and mild ascites. Also noted to have narrowing in b/l common iliac arteries.  Clinically she is noted to have b/l LE edema 1-2+  Most recent lab results (05/05/18) of CBC  is as follows: all values are WNL except for RBC at 2.59, HGB at 7.7, HCT at 22.7, RDW at 16.0, PLT at 89k.  She has had a CT bone marrow biopsy on 05/05/2018 -results pending. ECHO was done and showed  Systolic function was   normal. The estimated ejection fraction was in the range of 60%   to 65%. Wall motion was normal; there were no regional wall   motion abnormalities. Left ventricular diastolic function   parameters were normal.  Port a cath placement requested.  Patient was transferred to [facility] with her consent to receive C1 of chemotherapy as inpatient.  Interval History:  Deborah Cooley returns today for management and  evaluation of her Angioimmunoblastic T cell lymphoma. The patient's last visit with [place] was on 04/08/2020. The pt reports that she is doing well overall.  The pt reports that she is still having intermittent rash on her right arm and chest. Pt continues using Kenalog cream, which does improve the rash. She denies any new concerns or symptoms.   Lab results today (08/09/20) of CBC w/diff and CMP is as follows: all values are WNL except for PLT at 139K, Glucose at 135. 08/09/2020 LDH at 183  On review of systems, pt reports hot flashes and denies fevers, chills, night sweats, unexpected weight loss, SOB, cough, abdominal pain, constipation and any other symptoms.   MEDICAL HISTORY:  Past Medical History:  Diagnosis Date  . angioimmunoblastic lymphoma dx'd 03/2018  . Bilateral pleural effusion 04/17/2018  . Endometriosis   . Essential hypertension, benign   . Family history of adverse reaction to anesthesia    "sister stops breathing I think" (04/18/2018)  . Heart murmur   . Mixed hyperlipidemia   . Pneumonia 04/17/2018    SURGICAL HISTORY: Past Surgical History:  Procedure Laterality Date  . ABDOMINAL HYSTERECTOMY  1970s/1980s   for endometriosis  . Arthroscopic right shoulder surgery    . AXILLARY LYMPH NODE BIOPSY Left 04/21/2018   Procedure: LEFT AXILLARY LYMPH NODE BIOPSY;  Surgeon: Kieth Brightly, Arta Bruce, MD;  Location: [facility];  Service: General;  Laterality: Left;  . CARPAL TUNNEL RELEASE Right   . GANGLION CYST EXCISION Right   . IR IMAGING GUIDED PORT INSERTION  05/07/2018  .  IR REMOVAL TUN ACCESS W/ PORT W/O FL MOD SED  12/23/2018  . MULTIPLE TOOTH EXTRACTIONS    . OOPHORECTOMY  1997  . PARTIAL HYSTERECTOMY  1984  . Right hand surgery    . SHOULDER OPEN ROTATOR CUFF REPAIR Right   . VESICOVAGINAL FISTULA CLOSURE W/ TAH      SOCIAL HISTORY: Social History   Socioeconomic History  . Marital status: Married    Spouse name: Not on file  . Number of children: Not on file  .  Years of education: Not on file  . Highest education level: Not on file  Occupational History  . Occupation: Social research officer, government    Comment: works at [employer]  . Smoking status: Current Every Day Smoker    Packs/day: 1.50    Years: 46.00    Pack years: 69.00    Types: Cigarettes  . Smokeless tobacco: Never Used  Vaping Use  . Vaping Use: Never used  Substance and Sexual Activity  . Alcohol use: Not Currently    Comment: 04/18/2018 "quit years ago"  . Drug use: Not Currently    Comment: "back in my 70s"  . Sexual activity: Not Currently  Other Topics Concern  . Not on file  Social History Narrative   ** Merged History Encounter **       Patient has one son whom is healthy.   Has a live in boyfriend.   Social Determinants of Health   Financial Resource Strain:   . Difficulty of Paying Living Expenses: Not on file  Food Insecurity:   . Worried About Charity fundraiser in the Last Year: Not on file  . Ran Out of Food in the Last Year: Not on file  Transportation Needs:   . Lack of Transportation (Medical): Not on file  . Lack of Transportation (Non-Medical): Not on file  Physical Activity:   . Days of Exercise per Week: Not on file  . Minutes of Exercise per Session: Not on file  Stress:   . Feeling of Stress : Not on file  Social Connections:   . Frequency of Communication with Friends and Family: Not on file  . Frequency of Social Gatherings with Friends and Family: Not on file  . Attends Religious Services: Not on file  . Active Member of Clubs or Organizations: Not on file  . Attends Archivist Meetings: Not on file  . Marital Status: Not on file  Intimate Partner Violence:   . Fear of Current or Ex-Partner: Not on file  . Emotionally Abused: Not on file  . Physically Abused: Not on file  . Sexually Abused: Not on file    FAMILY HISTORY: Family History  Problem Relation Age of Onset  . Diabetes Mother        age 62  . Heart  attack Father        died age 38's  . Hypertension Father   . Cancer Sister        breast cancer diagonsed age 77 years  . Diabetes Other   . Heart disease Other        Female <55  . Arthritis Other   . Asthma Other   . Hyperlipidemia Other        several siblings  . Thyroid disease Sister        one sister with thyroid disease    ALLERGIES:  is allergic to [place] [zolpidem].  MEDICATIONS:  Current Outpatient Medications  Medication Sig Dispense Refill  . Multiple Vitamin (MULTIVITAMIN WITH MINERALS) TABS tablet Take 1 tablet by mouth daily. 30 tablet 0  . triamcinolone ointment (KENALOG) 0.5 % Apply 1 application topically 2 (two) times daily. To rash over the forearms and chest wall. Do not use on face. 30 g 0   No current facility-administered medications for this visit.    REVIEW OF SYSTEMS:   A 10+ POINT REVIEW OF SYSTEMS WAS OBTAINED including neurology, dermatology, psychiatry, cardiac, respiratory, lymph, extremities, GI, GU, Musculoskeletal, constitutional, breasts, reproductive, HEENT.  All pertinent positives are noted in the HPI.  All others are negative.   PHYSICAL EXAMINATION: ECOG PERFORMANCE STATUS: 2 - Symptomatic, <50% confined to bed  .BP (!) 157/60 (BP Location: Left Arm, Patient Position: Sitting)   Pulse 78   Temp 97.9 F (36.6 C) (Tympanic)   Resp 18   Ht 5' 1.81" (1.57 m)   Wt 131 lb 4.8 oz (59.6 kg)   SpO2 99%   BMI 24.16 kg/m    GENERAL:alert, in no acute distress and comfortable SKIN: no acute rashes, no significant lesions EYES: conjunctiva are pink and non-injected, sclera anicteric OROPHARYNX: MMM, no exudates, no oropharyngeal erythema or ulceration NECK: supple, no JVD LYMPH:  no palpable lymphadenopathy in the cervical, axillary or inguinal regions LUNGS: clear to auscultation b/l with normal respiratory effort HEART: regular rate & rhythm ABDOMEN:  normoactive bowel sounds , non tender, not distended. No palpable hepatosplenomegaly.   Extremity: no pedal edema PSYCH: alert & oriented x 3 with fluent speech NEURO: no focal motor/sensory deficits  LABORATORY DATA:  I have reviewed the data as listed  . CBC Latest Ref Rng & Units 08/09/2020 03/16/2020 12/02/2019  WBC 4.0 - 10.5 K/uL 8.1 7.2 9.3  Hemoglobin 12.0 - 15.0 g/dL 14.6 14.0 14.3  Hematocrit 36 - 46 % 42.2 41.9 41.4  Platelets 150 - 400 K/uL 139(L) 137(L) 141(L)  ANC 700  CBC    Component Value Date/Time   WBC 8.1 08/09/2020 1321   RBC 4.65 08/09/2020 1321   HGB 14.6 08/09/2020 1321   HGB 14.0 03/16/2020 1156   HCT 42.2 08/09/2020 1321   PLT 139 (L) 08/09/2020 1321   PLT 137 (L) 03/16/2020 1156   MCV 90.8 08/09/2020 1321   MCH 31.4 08/09/2020 1321   MCHC 34.6 08/09/2020 1321   RDW 12.9 08/09/2020 1321   LYMPHSABS 3.4 08/09/2020 1321   MONOABS 0.7 08/09/2020 1321   EOSABS 0.1 08/09/2020 1321   BASOSABS 0.0 08/09/2020 1321    . CMP Latest Ref Rng & Units 08/09/2020 03/16/2020 12/02/2019  Glucose 70 - 99 mg/dL 135(H) 378(H) 225(H)  BUN 8 - 23 mg/dL '13 10 13  ' Creatinine 0.44 - 1.00 mg/dL 0.73 0.84 0.94  Sodium 135 - 145 mmol/L 136 136 134(L)  Potassium 3.5 - 5.1 mmol/L 4.3 4.0 4.6  Chloride 98 - 111 mmol/L 104 102 99  CO2 22 - 32 mmol/L '24 24 25  ' Calcium 8.9 - 10.3 mg/dL 9.9 9.5 9.3  Total Protein 6.5 - 8.1 g/dL 7.4 7.0 7.2  Total Bilirubin 0.3 - 1.2 mg/dL 0.5 0.3 0.4  Alkaline Phos 38 - 126 U/L 71 78 87  AST 15 - 41 U/L '22 21 23  ' ALT 0 - 44 U/L 28 40 43    . Lab Results  Component Value Date   LDH 183 08/09/2020      Component     Latest Ref Rng & Units 04/18/2018 05/03/2018  Hepatitis B Surface  Ag     Negative  Negative  HCV Ab     0.0 - 0.9 s/co ratio  <0.1  Hep A Ab, IgM     Negative  Negative  Hep B Core Ab, IgM     Negative  Negative  HIV Screen 4th Generation wRfx     Non Reactive Non Reactive     04/21/18 Molecular Pathology:   04/21/18 Surgical Pathology:    RADIOGRAPHIC STUDIES: I have personally reviewed the  radiological images as listed and agreed with the findings in the report. No results found.  ASSESSMENT & PLAN:   64 y.o. female with  1. 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 S/P C1 OF CHOP on 05/08/2018 S/p C2 of Brentuximab-CHP on 05/28/2018  S/p C3 of Brentuximab-CHP on 06/18/2018 S/p C4 of Brentuximab-CHP on  07/09/2018 S/p C5 of Brentuximab-CHP on  07/30/2018  07/01/18 PET/CT revealed Resolution of axillary, mediastinal, periaortic, and inguinal lymphadenopathy. ( [place] 1) 2. Decrease in size of spleen.  Normal metabolic activity. 3. Uniform increase in marrow metabolic activity is most consistent with GCSF type response.   09/24/18 PET/CT revealed No evidence of residual or recurrent hypermetabolic lymphoma. ([place] 1). 2. 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 Recommend assessment of coronary risk factors and consideration of medical therapy. 3. Aortic atherosclerosis and emphysema.  Discussed the option to pursue BM transplant after completing treatment and that I would be happy to refer the pt to [facility] for a discussion with a transplant team - this was done but not possible due to lack of insurance. Then referred pt to [facility], and this also was not possible due to lack of insurance.  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  08/13/2019 CT C/A/P ([id number]) revealed "1. No recurrent adenopathy to suggest active malignancy. 2. Subtle ground-glass nodularity medially at the left lung apex, likely inflammatory. 3. Other imaging findings of potential clinical significance: Aortic Atherosclerosis (ICD10-I70.0). Coronary atherosclerosis. Emphysema (ICD10-J43.9). Airway thickening is present, suggesting bronchitis or reactive airways disease. Stable calcified right thyroid nodule. 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 General LNadenopathy  from lymphoma with b/l pleural effusion and b/l lower extremity weakness- resolved  3. Anemia/thrombocytopenia - from Bone marrow involvement by lymphoma + Chemotherapy BM Bx confirms involved by T cell lymphoma. Anemia now resolved. Mild stable thrombocytopenia  4. 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 LDH wnl no evidnece of overt hemolysis at this timne   5. 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  -Pt is now two years out from treatment. Will continue watchful observation.  -Recommend shorter, luke warm showers/baths and pt moisturize skin properly.  -Recommend pt receive the COVID19 and annual flu vaccine. Pt declines at this time.  -Recommend pt f/u with PCP rash is persistent -Refill Kenalog -Will see back in 6 months with labs   FOLLOW UP: RTC with Dr Irene Limbo with labs in 6 months   The total time spent in the appt was 20 minutes and more than 50% was on counseling and direct patient cares.  All of the patient's questions were answered with apparent satisfaction.  The patient knows to call the clinic with any problems, questions or concerns.   Sullivan Lone MD [place] AAHIVMS [facility] Hematology/Oncology Physician [facility]  (Office):       [phone] (Work cell):  [phone] (Fax):           [phone]  I, Yevette Edwards, am acting as a scribe for Dr. Sullivan Lone.   .I have reviewed the above documentation for accuracy and completeness, and I agree with the above. Brunetta Genera MD

## 2021-02-05 NOTE — Progress Notes (Signed)
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 female who has been referred to [place] by Dr Joni Reining for evaluation and management of her newly diagnosed angioimmunoblastic T cell lymphoma.   Patient has a h/o HTN, endometriosis, tobacco use who was admitted to the hospital with shortness of breath and was subsequently found to have diffuse lymphadenopathy. She was also found to have bilateral pleural effusions and has had a thoracentesis with pleural fluid demonstrating atypical lymphocytosis. She was discharged but returned to the ED with complaints of persisting SOB, abdominal swelling, and lower extremity swelling.   She has had a  CTA chest and CT abd/pelvis which showed no PE.but extensive LNadenopathy in the chest and throughout the abd with b/l pleural effusion and mild ascites. Also noted to have narrowing in b/l common iliac arteries.  Clinically she is noted to have b/l LE edema 1-2+  Most recent lab results (05/05/18) of CBC  is as follows: all values are WNL except for RBC at 2.59, HGB at 7.7, HCT at 22.7, RDW at 16.0, PLT at 89k.  She has had a CT bone marrow biopsy on 05/05/2018 -results pending. ECHO was done and showed  Systolic function was   normal. The estimated ejection fraction was in the range of 60%   to 65%. Wall motion was normal; there were no regional wall   motion abnormalities. Left ventricular diastolic function   parameters were normal.  Port a cath placement requested.  Patient was transferred to [facility] with her consent to receive C1 of chemotherapy as inpatient.  INTERVAL HISTORY:  [name] returns today for management and  evaluation of her Angioimmunoblastic T cell lymphoma. The patient's last visit with [place] was on 08/09/2020. The pt reports that she is doing well overall.  The pt reports no new concerns or symptoms. She has been eating well. The pt notes that she is still currently smoking a pack a day.  Lab results today 02/06/2021 of CBC w/diff and CMP is as follows: all values are WNL except for Plt of 145K. CMP pending. 02/06/2021 LDH . Lab Results  Component Value Date   LDH 165 02/06/2021   On review of systems, pt denies unexpected weight loss, fevers, chills, acute night sweats, chest pain, SOB, leg swelling, and any other symptoms.  MEDICAL HISTORY:  Past Medical History:  Diagnosis Date  . angioimmunoblastic lymphoma dx'd 03/2018  . Bilateral pleural effusion 04/17/2018  . Endometriosis   . Essential hypertension, benign   . Family history of adverse reaction to anesthesia    "sister stops breathing I think" (04/18/2018)  . Heart murmur   . Mixed hyperlipidemia   . Pneumonia 04/17/2018    SURGICAL HISTORY: Past Surgical History:  Procedure Laterality Date  . ABDOMINAL HYSTERECTOMY  1970s/1980s   for endometriosis  . Arthroscopic right shoulder surgery    . AXILLARY LYMPH NODE BIOPSY Left 04/21/2018   Procedure: LEFT AXILLARY LYMPH NODE BIOPSY;  Surgeon: Kieth Brightly, Arta Bruce, MD;  Location: [place];  Service: General;  Laterality: Left;  . CARPAL TUNNEL RELEASE Right   . GANGLION CYST EXCISION Right   . IR IMAGING GUIDED PORT INSERTION  05/07/2018  . IR REMOVAL  TUN ACCESS W/ PORT W/O FL MOD SED  12/23/2018  . MULTIPLE TOOTH EXTRACTIONS    . OOPHORECTOMY  1997  . PARTIAL HYSTERECTOMY  1984  . Right hand surgery    . SHOULDER OPEN ROTATOR CUFF REPAIR Right   . VESICOVAGINAL FISTULA CLOSURE W/ TAH      SOCIAL HISTORY: Social History   Socioeconomic History  . Marital status: Married    Spouse name: Not on file  . Number of children: Not on file  . Years of education: Not on file  .  Highest education level: Not on file  Occupational History  . Occupation: Social research officer, government    Comment: works at [employer]  . Smoking status: Current Every Day Smoker    Packs/day: 1.50    Years: 46.00    Pack years: 69.00    Types: Cigarettes  . Smokeless tobacco: Never Used  Vaping Use  . Vaping Use: Never used  Substance and Sexual Activity  . Alcohol use: Not Currently    Comment: 04/18/2018 "quit years ago"  . Drug use: Not Currently    Comment: "back in my 3s"  . Sexual activity: Not Currently  Other Topics Concern  . Not on file  Social History Narrative   ** Merged History Encounter **       Patient has one son whom is healthy.   Has a live in boyfriend.   Social Determinants of Health   Financial Resource Strain: Not on file  Food Insecurity: Not on file  Transportation Needs: Not on file  Physical Activity: Not on file  Stress: Not on file  Social Connections: Not on file  Intimate Partner Violence: Not on file    FAMILY HISTORY: Family History  Problem Relation Age of Onset  . Diabetes Mother        age 56  . Heart attack Father        died age 18's  . Hypertension Father   . Cancer Sister        breast cancer diagonsed age 46 years  . Diabetes Other   . Heart disease Other        Female <55  . Arthritis Other   . Asthma Other   . Hyperlipidemia Other        several siblings  . Thyroid disease Sister        one sister with thyroid disease    ALLERGIES:  is allergic to [place] [zolpidem].  MEDICATIONS:  Current Outpatient Medications  Medication Sig Dispense Refill  . Multiple Vitamin (MULTIVITAMIN WITH MINERALS) TABS tablet Take 1 tablet by mouth daily. (Patient not taking: Reported on 08/09/2020) 30 tablet 0  . triamcinolone ointment (KENALOG) 0.5 % Apply 1 application topically 2 (two) times daily. To rash over the forearms and chest wall. Do not use on face. 30 g 0   No current facility-administered medications for this  visit.    REVIEW OF SYSTEMS:   10 Point review of Systems was done is negative except as noted above.  PHYSICAL EXAMINATION: ECOG PERFORMANCE STATUS: 2 - Symptomatic, <50% confined to bed  .BP (!) 149/60 (BP Location: Left Arm, Patient Position: Sitting) Comment: nurse notified  Pulse 77   Temp 97.8 F (36.6 C) (Tympanic)   Resp 13   Ht 5' 1.81" (1.57 m)   Wt 131 lb 4.8 oz (59.6 kg)   SpO2 99%   BMI 24.16 kg/m     GENERAL:alert, in no acute distress  and comfortable SKIN: no acute rashes, no significant lesions EYES: conjunctiva are pink and non-injected, sclera anicteric OROPHARYNX: MMM, no exudates, no oropharyngeal erythema or ulceration NECK: supple, no JVD LYMPH:  no palpable lymphadenopathy in the cervical, axillary or inguinal regions LUNGS: clear to auscultation b/l with normal respiratory effort HEART: regular rate & rhythm ABDOMEN:  normoactive bowel sounds , non tender, not distended. Extremity: no pedal edema PSYCH: alert & oriented x 3 with fluent speech NEURO: no focal motor/sensory deficits   LABORATORY DATA:  I have reviewed the data as listed  CBC Latest Ref Rng & Units 02/06/2021 08/09/2020 03/16/2020  WBC 4.0 - 10.5 K/uL 8.7 8.1 7.2  Hemoglobin 12.0 - 15.0 g/dL 14.5 14.6 14.0  Hematocrit 36.0 - 46.0 % 41.4 42.2 41.9  Platelets 150 - 400 K/uL 145(L) 139(L) 137(L)  ANC 700  CBC    Component Value Date/Time   WBC 8.7 02/06/2021 1316   RBC 4.68 02/06/2021 1316   HGB 14.5 02/06/2021 1316   HGB 14.0 03/16/2020 1156   HCT 41.4 02/06/2021 1316   PLT 145 (L) 02/06/2021 1316   PLT 137 (L) 03/16/2020 1156   MCV 88.5 02/06/2021 1316   MCH 31.0 02/06/2021 1316   MCHC 35.0 02/06/2021 1316   RDW 12.6 02/06/2021 1316   LYMPHSABS 3.8 02/06/2021 1316   MONOABS 0.6 02/06/2021 1316   EOSABS 0.1 02/06/2021 1316   BASOSABS 0.0 02/06/2021 1316    CMP Latest Ref Rng & Units 02/06/2021 08/09/2020 03/16/2020  Glucose 70 - 99 mg/dL 112(H) 135(H) 378(H)  BUN 8 - 23  mg/dL '11 13 10  ' Creatinine 0.44 - 1.00 mg/dL 0.76 0.73 0.84  Sodium 135 - 145 mmol/L 137 136 136  Potassium 3.5 - 5.1 mmol/L 4.1 4.3 4.0  Chloride 98 - 111 mmol/L 104 104 102  CO2 22 - 32 mmol/L '25 24 24  ' Calcium 8.9 - 10.3 mg/dL 9.8 9.9 9.5  Total Protein 6.5 - 8.1 g/dL 7.3 7.4 7.0  Total Bilirubin 0.3 - 1.2 mg/dL 0.5 0.5 0.3  Alkaline Phos 38 - 126 U/L 66 71 78  AST 15 - 41 U/L '19 22 21  ' ALT 0 - 44 U/L 29 28 40    Lab Results  Component Value Date   LDH 165 02/06/2021      Component     Latest Ref Rng & Units 04/18/2018 05/03/2018  Hepatitis B Surface Ag     Negative  Negative  HCV Ab     0.0 - 0.9 s/co ratio  <0.1  Hep A Ab, IgM     Negative  Negative  Hep B Core Ab, IgM     Negative  Negative  HIV Screen 4th Generation wRfx     Non Reactive Non Reactive     04/21/18 Molecular Pathology:   04/21/18 Surgical Pathology:    RADIOGRAPHIC STUDIES: I have personally reviewed the radiological images as listed and agreed with the findings in the report. No results found.  ASSESSMENT & PLAN:   65 y.o. female with  1. 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 S/P C1 OF CHOP on 05/08/2018 S/p C2 of Brentuximab-CHP on 05/28/2018  S/p C3 of Brentuximab-CHP on 06/18/2018 S/p C4 of Brentuximab-CHP on  07/09/2018 S/p C5 of Brentuximab-CHP on  07/30/2018  07/01/18 PET/CT revealed Resolution of axillary, mediastinal, periaortic, and inguinal lymphadenopathy. ( [place] 1) 2. Decrease in size of spleen.  Normal metabolic activity. 3. Uniform increase  in marrow metabolic activity is most consistent with GCSF type response.   09/24/18 PET/CT revealed No evidence of residual or recurrent hypermetabolic lymphoma. ([place] 1). 2. 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 Recommend assessment of coronary risk factors and consideration of medical therapy. 3. Aortic atherosclerosis and emphysema.  Discussed the  option to pursue BM transplant after completing treatment and that I would be happy to refer the pt to [facility] for a discussion with a transplant team - this was done but not possible due to lack of insurance. Then referred pt to [facility], and this also was not possible due to lack of insurance.  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  08/13/2019 CT C/A/P ([id number]) revealed "1. No recurrent adenopathy to suggest active malignancy. 2. Subtle ground-glass nodularity medially at the left lung apex, likely inflammatory. 3. Other imaging findings of potential clinical significance: Aortic Atherosclerosis (ICD10-I70.0). Coronary atherosclerosis. Emphysema (ICD10-J43.9). Airway thickening is present, suggesting bronchitis or reactive airways disease. Stable calcified right thyroid nodule. 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 General LNadenopathy from lymphoma with b/l pleural effusion and b/l lower extremity weakness- resolved  3. Anemia/thrombocytopenia - from Bone marrow involvement by lymphoma + Chemotherapy BM Bx confirms involved by T cell lymphoma. Anemia now resolved. Mild stable thrombocytopenia  4. 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 LDH wnl no evidnece of overt hemolysis at this timne   5. 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 -No lab or clinical evidence of Angioimmunoblastic T-cell Lymphoma recurrence at this time.  -Pt is now over two years out from treatment. Will continue watchful observation.  -Recommend shorter, luke warm showers/baths and pt moisturize skin properly.  -Will see back in 6 months with labs.   FOLLOW UP: RTC with Dr Irene Limbo with labs in 6 months   The total time spent in the appointment was 20  minutes and more than 50% was on counseling and direct patient cares.   All of the patient's questions were answered with apparent satisfaction. The patient knows to call the clinic with any problems, questions or concerns.   Sullivan Lone MD [place] AAHIVMS [facility] Hematology/Oncology Physician [facility]  (Office):       [phone] (Work cell):  [phone] (Fax):           [phone]  I, Reinaldo Raddle, am acting as scribe for Dr. Sullivan Lone, MD.   .I have reviewed the above documentation for accuracy and completeness, and I agree with the above. Brunetta Genera MD

## 2021-02-06 ENCOUNTER — Inpatient Hospital Stay (HOSPITAL_BASED_OUTPATIENT_CLINIC_OR_DEPARTMENT_OTHER): Payer: Medicare Other | Admitting: Hematology

## 2021-02-06 ENCOUNTER — Other Ambulatory Visit: Payer: Self-pay

## 2021-02-06 ENCOUNTER — Inpatient Hospital Stay: Payer: Medicare Other | Attending: Hematology

## 2021-02-06 VITALS — BP 149/60 | HR 77 | Temp 97.8°F | Resp 13 | Ht 61.81 in | Wt 131.3 lb

## 2021-02-06 DIAGNOSIS — F17210 Nicotine dependence, cigarettes, uncomplicated: Secondary | ICD-10-CM | POA: Insufficient documentation

## 2021-02-06 DIAGNOSIS — Z86718 Personal history of other venous thrombosis and embolism: Secondary | ICD-10-CM | POA: Diagnosis not present

## 2021-02-06 DIAGNOSIS — C8440 Peripheral T-cell lymphoma, not classified, unspecified site: Secondary | ICD-10-CM

## 2021-02-06 DIAGNOSIS — C865 Angioimmunoblastic T-cell lymphoma: Secondary | ICD-10-CM | POA: Diagnosis present

## 2021-02-06 LAB — CBC WITH DIFFERENTIAL/PLATELET
Abs Immature Granulocytes: 0.02 10*3/uL (ref 0.00–0.07)
Basophils Absolute: 0 10*3/uL (ref 0.0–0.1)
Basophils Relative: 0 %
Eosinophils Absolute: 0.1 10*3/uL (ref 0.0–0.5)
Eosinophils Relative: 1 %
HCT: 41.4 % (ref 36.0–46.0)
Hemoglobin: 14.5 g/dL (ref 12.0–15.0)
Immature Granulocytes: 0 %
Lymphocytes Relative: 43 %
Lymphs Abs: 3.8 10*3/uL (ref 0.7–4.0)
MCH: 31 pg (ref 26.0–34.0)
MCHC: 35 g/dL (ref 30.0–36.0)
MCV: 88.5 fL (ref 80.0–100.0)
Monocytes Absolute: 0.6 10*3/uL (ref 0.1–1.0)
Monocytes Relative: 7 %
Neutro Abs: 4.2 10*3/uL (ref 1.7–7.7)
Neutrophils Relative %: 49 %
Platelets: 145 10*3/uL — ABNORMAL LOW (ref 150–400)
RBC: 4.68 MIL/uL (ref 3.87–5.11)
RDW: 12.6 % (ref 11.5–15.5)
WBC: 8.7 10*3/uL (ref 4.0–10.5)
nRBC: 0 % (ref 0.0–0.2)

## 2021-02-06 LAB — CMP (CANCER CENTER ONLY)
ALT: 29 U/L (ref 0–44)
AST: 19 U/L (ref 15–41)
Albumin: 4.3 g/dL (ref 3.5–5.0)
Alkaline Phosphatase: 66 U/L (ref 38–126)
Anion gap: 8 (ref 5–15)
BUN: 11 mg/dL (ref 8–23)
CO2: 25 mmol/L (ref 22–32)
Calcium: 9.8 mg/dL (ref 8.9–10.3)
Chloride: 104 mmol/L (ref 98–111)
Creatinine: 0.76 mg/dL (ref 0.44–1.00)
GFR, Estimated: >60 mL/min (ref >60)
Glucose, Bld: 112 mg/dL — ABNORMAL HIGH (ref 70–99)
Potassium: 4.1 mmol/L (ref 3.5–5.1)
Sodium: 137 mmol/L (ref 135–145)
Total Bilirubin: 0.5 mg/dL (ref 0.3–1.2)
Total Protein: 7.3 g/dL (ref 6.5–8.1)

## 2021-02-06 LAB — LACTATE DEHYDROGENASE: LDH: 165 U/L (ref 98–192)

## 2021-02-07 ENCOUNTER — Telehealth: Payer: Self-pay | Admitting: Hematology

## 2021-02-07 NOTE — Telephone Encounter (Signed)
Left message with follow-up appointment per 3/21 los. Gave option to call back to reschedule if needed. 

## 2021-08-09 ENCOUNTER — Inpatient Hospital Stay: Payer: Medicare Other

## 2021-08-09 ENCOUNTER — Other Ambulatory Visit: Payer: Self-pay

## 2021-08-09 ENCOUNTER — Inpatient Hospital Stay: Payer: Medicare Other | Attending: Hematology | Admitting: Hematology

## 2021-08-09 VITALS — BP 133/58 | HR 88 | Temp 99.1°F | Resp 18 | Wt 129.4 lb

## 2021-08-09 DIAGNOSIS — F17210 Nicotine dependence, cigarettes, uncomplicated: Secondary | ICD-10-CM | POA: Diagnosis not present

## 2021-08-09 DIAGNOSIS — C8440 Peripheral T-cell lymphoma, not classified, unspecified site: Secondary | ICD-10-CM

## 2021-08-09 DIAGNOSIS — C865 Angioimmunoblastic T-cell lymphoma: Secondary | ICD-10-CM | POA: Insufficient documentation

## 2021-08-09 LAB — CMP (CANCER CENTER ONLY)
ALT: 20 U/L (ref 0–44)
AST: 17 U/L (ref 15–41)
Albumin: 4.3 g/dL (ref 3.5–5.0)
Alkaline Phosphatase: 71 U/L (ref 38–126)
Anion gap: 11 (ref 5–15)
BUN: 11 mg/dL (ref 8–23)
CO2: 24 mmol/L (ref 22–32)
Calcium: 10.2 mg/dL (ref 8.9–10.3)
Chloride: 102 mmol/L (ref 98–111)
Creatinine: 0.76 mg/dL (ref 0.44–1.00)
GFR, Estimated: >60 mL/min (ref >60)
Glucose, Bld: 203 mg/dL — ABNORMAL HIGH (ref 70–99)
Potassium: 3.8 mmol/L (ref 3.5–5.1)
Sodium: 137 mmol/L (ref 135–145)
Total Bilirubin: 0.5 mg/dL (ref 0.3–1.2)
Total Protein: 7.4 g/dL (ref 6.5–8.1)

## 2021-08-09 LAB — CBC WITH DIFFERENTIAL/PLATELET
Abs Immature Granulocytes: 0.02 10*3/uL (ref 0.00–0.07)
Basophils Absolute: 0 10*3/uL (ref 0.0–0.1)
Basophils Relative: 0 %
Eosinophils Absolute: 0.1 10*3/uL (ref 0.0–0.5)
Eosinophils Relative: 1 %
HCT: 43.2 % (ref 36.0–46.0)
Hemoglobin: 14.8 g/dL (ref 12.0–15.0)
Immature Granulocytes: 0 %
Lymphocytes Relative: 43 %
Lymphs Abs: 3.6 10*3/uL (ref 0.7–4.0)
MCH: 30.5 pg (ref 26.0–34.0)
MCHC: 34.3 g/dL (ref 30.0–36.0)
MCV: 89.1 fL (ref 80.0–100.0)
Monocytes Absolute: 0.6 10*3/uL (ref 0.1–1.0)
Monocytes Relative: 7 %
Neutro Abs: 4 10*3/uL (ref 1.7–7.7)
Neutrophils Relative %: 49 %
Platelets: 153 10*3/uL (ref 150–400)
RBC: 4.85 MIL/uL (ref 3.87–5.11)
RDW: 12.9 % (ref 11.5–15.5)
WBC: 8.3 10*3/uL (ref 4.0–10.5)
nRBC: 0 % (ref 0.0–0.2)

## 2021-08-09 LAB — LACTATE DEHYDROGENASE: LDH: 148 U/L (ref 98–192)

## 2021-08-10 ENCOUNTER — Telehealth: Payer: Self-pay | Admitting: Hematology

## 2021-08-10 NOTE — Telephone Encounter (Signed)
Left message with follow-up appointment per 9/21 los. 

## 2021-08-15 ENCOUNTER — Encounter: Payer: Self-pay | Admitting: Hematology

## 2021-08-15 NOTE — Progress Notes (Signed)
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 female who has been referred to [place] by Dr Joni Reining for evaluation and management of her newly diagnosed angioimmunoblastic T cell lymphoma.   Patient has a h/o HTN, endometriosis, tobacco use who was admitted to the hospital with shortness of breath and was subsequently found to have diffuse lymphadenopathy. She was also found to have bilateral pleural effusions and has had a thoracentesis with pleural fluid demonstrating atypical lymphocytosis. She was discharged but returned to the ED with complaints of persisting SOB, abdominal swelling, and lower extremity swelling.   She has had a  CTA chest and CT abd/pelvis which showed no PE.but extensive LNadenopathy in the chest and throughout the abd with b/l pleural effusion and mild ascites. Also noted to have narrowing in b/l common iliac arteries.  Clinically she is noted to have b/l LE edema 1-2+  Most recent lab results (05/05/18) of CBC  is as follows: all values are WNL except for RBC at 2.59, HGB at 7.7, HCT at 22.7, RDW at 16.0, PLT at 89k.  She has had a CT bone marrow biopsy on 05/05/2018 -results pending. ECHO was done and showed  Systolic function was   normal. The estimated ejection fraction was in the range of 60%   to 65%. Wall motion was normal; there were no regional wall   motion abnormalities. Left ventricular diastolic function   parameters were normal.  Port a cath placement requested.  Patient was transferred to [facility] with her consent to receive C1 of chemotherapy as inpatient.  INTERVAL HISTORY:   Deborah Cooley returns today for  management and evaluation of her Angioimmunoblastic T cell lymphoma. The patient's last visit with [place] was on 02/06/2021.. The pt reports that she is doing well overall.  The pt reports no new concerns or symptoms. She has been eating well. The pt notes that she is still currently smoking a pack a day.  Lab results today 08/09/2021 of CBC w/diff wnl, CMP - unremarkable .LDH .148  On review of systems, pt denies unexpected weight loss, fevers, chills, acute night sweats, chest pain, SOB, leg swelling, and any other symptoms.  MEDICAL HISTORY:  Past Medical History:  Diagnosis Date   angioimmunoblastic lymphoma dx'd 03/2018   Bilateral pleural effusion 04/17/2018   Endometriosis    Essential hypertension, benign    Family history of adverse reaction to anesthesia    "sister stops breathing I think" (04/18/2018)   Heart murmur    Mixed hyperlipidemia    Pneumonia 04/17/2018    SURGICAL HISTORY: Past Surgical History:  Procedure Laterality Date   ABDOMINAL HYSTERECTOMY  1970s/1980s   for endometriosis   Arthroscopic right shoulder surgery     AXILLARY LYMPH NODE BIOPSY Left 04/21/2018   Procedure: LEFT AXILLARY LYMPH NODE BIOPSY;  Surgeon: Kieth Brightly Arta Bruce, MD;  Location: [place];  Service: General;  Laterality: Left;   CARPAL TUNNEL RELEASE Right    GANGLION CYST EXCISION Right    IR IMAGING GUIDED PORT INSERTION  05/07/2018   IR REMOVAL TUN ACCESS W/ PORT W/O FL MOD SED  12/23/2018   MULTIPLE TOOTH [place]  PARTIAL HYSTERECTOMY  1984   Right hand surgery     SHOULDER OPEN ROTATOR CUFF REPAIR Right    VESICOVAGINAL FISTULA CLOSURE W/ TAH      SOCIAL HISTORY: Social History   Socioeconomic History   Marital status: Married    Spouse name: Not on file   Number of children: Not on file   Years of education: Not on file   Highest education level: Not on file  Occupational History   Occupation: Social research officer, government    Comment: works at [employer]   Smoking status: Every Day    Packs/day: 1.50    Years: 46.00    Pack years: 69.00    Types: Cigarettes   Smokeless tobacco: Never  Vaping Use   Vaping Use: Never used  Substance and Sexual Activity   Alcohol use: Not Currently    Comment: 04/18/2018 "quit years ago"   Drug use: Not Currently    Comment: "back in my 1s"   Sexual activity: Not Currently  Other Topics Concern   Not on file  Social History Narrative   ** Merged History Encounter **       Patient has one son whom is healthy.   Has a live in boyfriend.   Social Determinants of Health   Financial Resource Strain: Not on file  Food Insecurity: Not on file  Transportation Needs: Not on file  Physical Activity: Not on file  Stress: Not on file  Social Connections: Not on file  Intimate Partner Violence: Not on file    FAMILY HISTORY: Family History  Problem Relation Age of Onset   Diabetes Mother        age 24   Heart attack Father        died age 71's   Hypertension Father    Cancer Sister        breast cancer diagonsed age 62 years   Diabetes Other    Heart disease Other        Female <55   Arthritis Other    Asthma Other    Hyperlipidemia Other        several siblings   Thyroid disease Sister        one sister with thyroid disease    ALLERGIES:  is allergic to [place] [zolpidem].  MEDICATIONS:  Current Outpatient Medications  Medication Sig Dispense Refill   Multiple Vitamin (MULTIVITAMIN WITH MINERALS) TABS tablet Take 1 tablet by mouth daily. (Patient not taking: No sig reported) 30 tablet 0   triamcinolone ointment (KENALOG) 0.5 % Apply 1 application topically 2 (two) times daily. To rash over the forearms and chest wall. Do not use on face. (Patient not taking: Reported on 08/09/2021) 30 g 0   No current facility-administered medications for this visit.    REVIEW OF SYSTEMS:   .10 Point review of Systems was done is negative except as noted above.   PHYSICAL  EXAMINATION: ECOG PERFORMANCE STATUS: 2 - Symptomatic, <50% confined to bed  .BP (!) 133/58 (BP Location: Left Arm, Patient Position: Sitting) Comment: Nurse aware of patient BP  Pulse 88   Temp 99.1 F (37.3 C) (Oral)   Resp 18   Wt 129 lb 6.4 oz (58.7 kg)   SpO2 97%   BMI 23.81 kg/m    . GENERAL:alert, in no acute distress and comfortable SKIN: no acute rashes, no significant lesions EYES: conjunctiva are pink and non-injected, sclera anicteric OROPHARYNX: MMM, no exudates, no oropharyngeal erythema  or ulceration NECK: supple, no JVD LYMPH:  no palpable lymphadenopathy in the cervical, axillary or inguinal regions LUNGS: clear to auscultation b/l with normal respiratory effort HEART: regular rate & rhythm ABDOMEN:  normoactive bowel sounds , non tender, not distended. Extremity: no pedal edema PSYCH: alert & oriented x 3 with fluent speech NEURO: no focal motor/sensory deficits    LABORATORY DATA:  I have reviewed the data as listed  CBC Latest Ref Rng & Units 08/09/2021 02/06/2021 08/09/2020  WBC 4.0 - 10.5 K/uL 8.3 8.7 8.1  Hemoglobin 12.0 - 15.0 g/dL 14.8 14.5 14.6  Hematocrit 36.0 - 46.0 % 43.2 41.4 42.2  Platelets 150 - 400 K/uL 153 145(L) 139(L)  ANC 700  CBC    Component Value Date/Time   WBC 8.3 08/09/2021 1339   RBC 4.85 08/09/2021 1339   HGB 14.8 08/09/2021 1339   HGB 14.0 03/16/2020 1156   HCT 43.2 08/09/2021 1339   PLT 153 08/09/2021 1339   PLT 137 (L) 03/16/2020 1156   MCV 89.1 08/09/2021 1339   MCH 30.5 08/09/2021 1339   MCHC 34.3 08/09/2021 1339   RDW 12.9 08/09/2021 1339   LYMPHSABS 3.6 08/09/2021 1339   MONOABS 0.6 08/09/2021 1339   EOSABS 0.1 08/09/2021 1339   BASOSABS 0.0 08/09/2021 1339    CMP Latest Ref Rng & Units 08/09/2021 02/06/2021 08/09/2020  Glucose 70 - 99 mg/dL 203(H) 112(H) 135(H)  BUN 8 - 23 mg/dL _0 Creatinine 0.44 - 1.00 mg/dL 0.76 0.76 0.73  Sodium 135 - 145 mmol/L 137 137 136  Potassium 3.5 - 5.1 mmol/L 3.8 4.1 4.3   Chloride 98 - 111 mmol/L 102 104 104  CO2 22 - 32 mmol/L _1 Calcium 8.9 - 10.3 mg/dL 10.2 9.8 9.9  Total Protein 6.5 - 8.1 g/dL 7.4 7.3 7.4  Total Bilirubin 0.3 - 1.2 mg/dL 0.5 0.5 0.5  Alkaline Phos 38 - 126 U/L 71 66 71  AST 15 - 41 U/L _2 ALT 0 - 44 U/L _3 Lab Results  Component Value Date   LDH 148 08/09/2021      Component     Latest Ref Rng & Units 04/18/2018 05/03/2018  Hepatitis B Surface Ag     Negative  Negative  HCV Ab     0.0 - 0.9 s/co ratio  <0.1  Hep A Ab, IgM     Negative  Negative  Hep B Core Ab, IgM     Negative  Negative  HIV Screen 4th Generation wRfx     Non Reactive Non Reactive     04/21/18 Molecular Pathology:   04/21/18 Surgical Pathology:    RADIOGRAPHIC STUDIES: I have personally reviewed the radiological images as listed and agreed with the findings in the report. No results found.  ASSESSMENT & PLAN:   65 y.o. female with   1. 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 S/P C1 OF CHOP on 05/08/2018 S/p C2 of Brentuximab-CHP on 05/28/2018  S/p C3 of Brentuximab-CHP on 06/18/2018 S/p C4 of Brentuximab-CHP on  07/09/2018 S/p C5 of Brentuximab-CHP on  07/30/2018  07/01/18 PET/CT revealed Resolution of axillary, mediastinal, periaortic, and inguinal lymphadenopathy. ( [place] 1) 2. Decrease in size of spleen.  Normal metabolic activity. 3. Uniform increase in marrow metabolic activity is most consistent with GCSF type response.   09/24/18 PET/CT revealed No evidence of residual or recurrent  hypermetabolic lymphoma. ([place] 1). 2. 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 Recommend assessment of coronary risk factors and consideration of medical therapy. 3. Aortic atherosclerosis and emphysema.  Discussed the option to pursue BM transplant after completing treatment and that I would be happy to refer the pt to [facility] for a discussion with a  transplant team - this was done but not possible due to lack of insurance. Then referred pt to [facility], and this also was not possible due to lack of insurance.  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  08/13/2019 CT C/A/P ([id number]) revealed "1. No recurrent adenopathy to suggest active malignancy. 2. Subtle ground-glass nodularity medially at the left lung apex, likely inflammatory. 3. Other imaging findings of potential clinical significance: Aortic Atherosclerosis (ICD10-I70.0). Coronary atherosclerosis. Emphysema (ICD10-J43.9). Airway thickening is present, suggesting bronchitis or reactive airways disease. Stable calcified right thyroid nodule. 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 General LNadenopathy from lymphoma with b/l pleural effusion and b/l lower extremity weakness- resolved   3. Anemia/thrombocytopenia -  from Bone marrow involvement by lymphoma + Chemotherapy BM Bx confirms involved by T cell lymphoma. Anemia now resolved. Mild stable thrombocytopenia  4. 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 LDH wnl no evidnece of overt hemolysis at this timne    5. 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 -No lab or clinical evidence of Angioimmunoblastic T-cell Lymphoma recurrence at this time.  -Pt is now over two years out from treatment. Will continue watchful observation every 6 months. -patient declined consideration for consolidative AutoHSCT -Will see back in 6 months with labs.   FOLLOW UP: RTC with Dr Irene Limbo with labs in 6 months  . The total time spent in the appointment was 20 minutes and more than 50% was on counseling and direct patient cares.  All of the patient's questions were answered with apparent satisfaction.  The patient knows to call the clinic with any problems, questions or concerns.   Sullivan Lone MD [place] AAHIVMS [facility] Hematology/Oncology Physician [facility]

## 2022-02-05 ENCOUNTER — Other Ambulatory Visit: Payer: Self-pay

## 2022-02-05 DIAGNOSIS — C8440 Peripheral T-cell lymphoma, not classified, unspecified site: Secondary | ICD-10-CM

## 2022-02-06 ENCOUNTER — Other Ambulatory Visit: Payer: Self-pay

## 2022-02-06 ENCOUNTER — Inpatient Hospital Stay: Payer: Medicare Other | Attending: Hematology | Admitting: Hematology

## 2022-02-06 ENCOUNTER — Inpatient Hospital Stay: Payer: Medicare Other

## 2022-02-06 VITALS — BP 135/57 | HR 85 | Temp 97.7°F | Resp 18 | Wt 128.9 lb

## 2022-02-06 DIAGNOSIS — D696 Thrombocytopenia, unspecified: Secondary | ICD-10-CM | POA: Insufficient documentation

## 2022-02-06 DIAGNOSIS — C8440 Peripheral T-cell lymphoma, not classified, unspecified site: Secondary | ICD-10-CM

## 2022-02-06 DIAGNOSIS — C865 Angioimmunoblastic T-cell lymphoma: Secondary | ICD-10-CM | POA: Diagnosis not present

## 2022-02-06 DIAGNOSIS — F17210 Nicotine dependence, cigarettes, uncomplicated: Secondary | ICD-10-CM | POA: Diagnosis not present

## 2022-02-06 LAB — CBC WITH DIFFERENTIAL (CANCER CENTER ONLY)
Abs Immature Granulocytes: 0.03 10*3/uL (ref 0.00–0.07)
Basophils Absolute: 0 10*3/uL (ref 0.0–0.1)
Basophils Relative: 1 %
Eosinophils Absolute: 0.1 10*3/uL (ref 0.0–0.5)
Eosinophils Relative: 1 %
HCT: 41.1 % (ref 36.0–46.0)
Hemoglobin: 14.1 g/dL (ref 12.0–15.0)
Immature Granulocytes: 0 %
Lymphocytes Relative: 41 %
Lymphs Abs: 3.3 10*3/uL (ref 0.7–4.0)
MCH: 30.6 pg (ref 26.0–34.0)
MCHC: 34.3 g/dL (ref 30.0–36.0)
MCV: 89.2 fL (ref 80.0–100.0)
Monocytes Absolute: 0.7 10*3/uL (ref 0.1–1.0)
Monocytes Relative: 8 %
Neutro Abs: 3.9 10*3/uL (ref 1.7–7.7)
Neutrophils Relative %: 49 %
Platelet Count: 152 10*3/uL (ref 150–400)
RBC: 4.61 MIL/uL (ref 3.87–5.11)
RDW: 12.8 % (ref 11.5–15.5)
WBC Count: 8 10*3/uL (ref 4.0–10.5)
nRBC: 0 % (ref 0.0–0.2)

## 2022-02-06 LAB — CMP (CANCER CENTER ONLY)
ALT: 15 U/L (ref 0–44)
AST: 14 U/L — ABNORMAL LOW (ref 15–41)
Albumin: 4.4 g/dL (ref 3.5–5.0)
Alkaline Phosphatase: 60 U/L (ref 38–126)
Anion gap: 8 (ref 5–15)
BUN: 11 mg/dL (ref 8–23)
CO2: 27 mmol/L (ref 22–32)
Calcium: 10.4 mg/dL — ABNORMAL HIGH (ref 8.9–10.3)
Chloride: 100 mmol/L (ref 98–111)
Creatinine: 0.65 mg/dL (ref 0.44–1.00)
GFR, Estimated: >60 mL/min (ref >60)
Glucose, Bld: 145 mg/dL — ABNORMAL HIGH (ref 70–99)
Potassium: 4.1 mmol/L (ref 3.5–5.1)
Sodium: 135 mmol/L (ref 135–145)
Total Bilirubin: 0.5 mg/dL (ref 0.3–1.2)
Total Protein: 7.3 g/dL (ref 6.5–8.1)

## 2022-02-06 LAB — LACTATE DEHYDROGENASE: LDH: 134 U/L (ref 98–192)

## 2022-02-06 MED ORDER — TRIAMCINOLONE ACETONIDE 0.5 % EX OINT: 1.0000 "application " | TOPICAL_OINTMENT | Freq: Two times a day (BID) | CUTANEOUS | 0 refills | Status: DC

## 2022-02-06 MED ORDER — TRIAMCINOLONE ACETONIDE 0.5 % EX OINT: 1.0000 "application " | TOPICAL_OINTMENT | Freq: Two times a day (BID) | CUTANEOUS | 1 refills | Status: DC

## 2022-02-06 NOTE — Progress Notes (Signed)
? ? ?HEMATOLOGY/ONCOLOGY CLINIC NOTE ? ?Date of Service: 02/06/2022 ? ?Patient Care Team: ?Patient, No Pcp Per (Inactive) as PCP - General (General Practice) ?[name], MD (Unknown Physician Specialty) ? ?CHIEF COMPLAINTS/PURPOSE OF CONSULTATION:  ?Follow-up for continued evaluation and management of T-cell angioimmunoblastic lymphoma ? ?HISTORY OF PRESENTING ILLNESS:  ?Please see previous note for details on initial presentation ?INTERVAL HISTORY:  ? ?Deborah Cooley returns today for her 67-monthfollow-up for angioimmunoblastic T-cell lymphoma. ?She notes no acute new symptoms since her last clinic visit. ?Weight has been stable. ?Good p.o. intake. ?No fevers no chills no night sweats no unexpected weight loss ?No new lumps or bumps. ?No shortness of breath or chest pain. ?No abdominal pain or distention. ?Lab results done today were reviewed in detail with the patient. ?She notes that she has not been following up with her primary care physician. ? ?MEDICAL HISTORY:  ?Past Medical History:  ?Diagnosis Date  ? angioimmunoblastic lymphoma dx'd 03/2018  ? Bilateral pleural effusion 04/17/2018  ? Endometriosis   ? Essential hypertension, benign   ? Family history of adverse reaction to anesthesia   ? "sister stops breathing I think" (04/18/2018)  ? Heart murmur   ? Mixed hyperlipidemia   ? Pneumonia 04/17/2018  ? ? ?SURGICAL HISTORY: ?Past Surgical History:  ?Procedure Laterality Date  ? ABDOMINAL HYSTERECTOMY  1970s/1980s  ? for endometriosis  ? Arthroscopic right shoulder surgery    ? AXILLARY LYMPH NODE BIOPSY Left 04/21/2018  ? Procedure: LEFT AXILLARY LYMPH NODE BIOPSY;  Surgeon: Kinsinger, LArta Bruce MD;  [place]  Service: General;  Laterality: Left;  ? CARPAL TUNNEL RELEASE Right   ? GANGLION CYST EXCISION Right   ? IR IMAGING GUIDED PORT INSERTION  05/07/2018  ? IR REMOVAL TUN ACCESS W/ PORT W/O FL MOD SED  12/23/2018  ? MULTIPLE TOOTH EXTRACTIONS    ? OOPHORECTOMY  1997  ? PARTIAL HYSTERECTOMY   1984  ? Right hand surgery    ? SHOULDER OPEN ROTATOR CUFF REPAIR Right   ? VESICOVAGINAL FISTULA CLOSURE W/ TAH    ? ? ?SOCIAL HISTORY: ?Social History  ? ?Socioeconomic History  ? Marital status: Married  ?  Spouse name: Not on file  ? Number of children: Not on file  ? Years of education: Not on file  ? Highest education level: Not on file  ?Occupational History  ? Occupation: SSocial research officer, government ?  Comment: works at [employer] ?Tobacco Use  ? Smoking status: Every Day  ?  Packs/day: 1.50  ?  Years: 46.00  ?  Pack years: 69.00  ?  Types: Cigarettes  ? Smokeless tobacco: Never  ?Vaping Use  ? Vaping Use: Never used  ?Substance and Sexual Activity  ? Alcohol use: Not Currently  ?  Comment: 04/18/2018 "quit years ago"  ? Drug use: Not Currently  ?  Comment: "back in my 28s  ? Sexual activity: Not Currently  ?Other Topics Concern  ? Not on file  ?Social History Narrative  ? ** Merged History Encounter **  ?    ? Patient has one son whom is healthy.  ? Has a live in boyfriend.  ? ?Social Determinants of Health  ? ?Financial Resource Strain: Not on file  ?Food Insecurity: Not on file  ?Transportation Needs: Not on file  ?Physical Activity: Not on file  ?Stress: Not on file  ?Social Connections: Not on file  ?Intimate Partner Violence: Not on file  ? ? ?FAMILY HISTORY: ?  Family History  ?Problem Relation Age of Onset  ? Diabetes Mother   ?     age 53  ? Heart attack Father   ?     died age 99's  ? Hypertension Father   ? Cancer Sister   ?     breast cancer diagonsed age 41 years  ? Diabetes Other   ? Heart disease Other   ?     Female <55  ? Arthritis Other   ? Asthma Other   ? Hyperlipidemia Other   ?     several siblings  ? Thyroid disease Sister   ?     one sister with thyroid disease  ? ? ?ALLERGIES:  is allergic to ambien [zolpidem]. ? ?MEDICATIONS:  ?Current Outpatient Medications  ?Medication Sig Dispense Refill  ? Multiple Vitamin (MULTIVITAMIN WITH MINERALS) TABS tablet Take 1 tablet by mouth daily.  (Patient not taking: No sig reported) 30 tablet 0  ? triamcinolone ointment (KENALOG) 0.5 % Apply 1 application topically 2 (two) times daily. To rash over the forearms and chest wall. Do not use on face. (Patient not taking: Reported on 08/09/2021) 30 g 0  ? ?No current facility-administered medications for this visit.  ? ? ?REVIEW OF SYSTEMS:   ?10 Point review of Systems was done is negative except as noted above. ? ? ?PHYSICAL EXAMINATION: ?ECOG PERFORMANCE STATUS: 2 - Symptomatic, <50% confined to bed  ?.BP (!) 135/57   Pulse 85   Temp 97.7 ?F (36.5 ?C)   Resp 18   Wt 128 lb 14.4 oz (58.5 kg)   SpO2 98%   BMI 23.72 kg/m?   ?NAD ?GENERAL:alert, in no acute distress and comfortable ?SKIN: no acute rashes, no significant lesions ?EYES: conjunctiva are pink and non-injected, sclera anicteric ?OROPHARYNX: MMM, no exudates, no oropharyngeal erythema or ulceration ?NECK: supple, no JVD ?LYMPH:  no palpable lymphadenopathy in the cervical, axillary or inguinal regions ?LUNGS: clear to auscultation b/l with normal respiratory effort ?HEART: regular rate & rhythm ?ABDOMEN:  normoactive bowel sounds , non tender, not distended.  No palpable hepatosplenomegaly. ?Extremity: no pedal edema ?PSYCH: alert & oriented x 3 with fluent speech ?NEURO: no focal motor/sensory deficits ? ?LABORATORY DATA:  ?I have reviewed the data as listed ? ? ?  Latest Ref Rng & Units 02/06/2022  ?  1:44 PM 08/09/2021  ?  1:39 PM 02/06/2021  ?  1:16 PM  ?CBC  ?WBC 4.0 - 10.5 K/uL 8.0   8.3   8.7    ?Hemoglobin 12.0 - 15.0 g/dL 14.1   14.8   14.5    ?Hematocrit 36.0 - 46.0 % 41.1   43.2   41.4    ?Platelets 150 - 400 K/uL 152   153   145    ?[place] 700 ? ?CBC ?   ?Component Value Date/Time  ? WBC 8.0 02/06/2022 1344  ? WBC 8.3 08/09/2021 1339  ? RBC 4.61 02/06/2022 1344  ? HGB 14.1 02/06/2022 1344  ? HCT 41.1 02/06/2022 1344  ? PLT 152 02/06/2022 1344  ? MCV 89.2 02/06/2022 1344  ? MCH 30.6 02/06/2022 1344  ? MCHC 34.3 02/06/2022 1344  ? RDW 12.8  02/06/2022 1344  ? LYMPHSABS 3.3 02/06/2022 1344  ? MONOABS 0.7 02/06/2022 1344  ? EOSABS 0.1 02/06/2022 1344  ? BASOSABS 0.0 02/06/2022 1344  ? ? ?CMP Latest Ref Rng & Units 08/09/2021 02/06/2021 08/09/2020  ?Glucose 70 - 99 mg/dL 203(H) 112(H) 135(H)  ?BUN 8 - 23 mg/dL  $'11 11 13  'W$ ?Creatinine 0.44 - 1.00 mg/dL 0.76 0.76 0.73  ?Sodium 135 - 145 mmol/L 137 137 136  ?Potassium 3.5 - 5.1 mmol/L 3.8 4.1 4.3  ?Chloride 98 - 111 mmol/L 102 104 104  ?CO2 22 - 32 mmol/L '24 25 24  '$ ?Calcium 8.9 - 10.3 mg/dL 10.2 9.8 9.9  ?Total Protein 6.5 - 8.1 g/dL 7.4 7.3 7.4  ?Total Bilirubin 0.3 - 1.2 mg/dL 0.5 0.5 0.5  ?Alkaline Phos 38 - 126 U/L 71 66 71  ?AST 15 - 41 U/L '17 19 22  '$ ?ALT 0 - 44 U/L '20 29 28  '$ ? ? ?Lab Results  ?Component Value Date  ? LDH 134 02/06/2022  ? ? ? ? ?Component ?    Latest Ref Rng & Units 04/18/2018 05/03/2018  ?Hepatitis B Surface Ag ?    Negative  Negative  ?HCV Ab ?    0.0 - 0.9 s/co ratio  <0.1  ?Hep A Ab, IgM ?    Negative  Negative  ?Hep B Core Ab, IgM ?    Negative  Negative  ?HIV Screen 4th Generation wRfx ?    Non Reactive Non Reactive   ? ? ?04/21/18 Molecular Pathology: ? ? ?04/21/18 Surgical Pathology: ? ? ? ?RADIOGRAPHIC STUDIES: ?I have personally reviewed the radiological images as listed and agreed with the findings in the report. ?No results found. ? ?ASSESSMENT & PLAN:  ? ?66 y.o. female with ?  ?1. 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 -ECHO done normal EF ?-port-a-cath placed. ?S/P C1 OF CHOP on 05/08/2018 ?S/p C2 of Brentuximab-CHP on 05/28/2018  ?S/p C3 of Brentuximab-CHP on 06/18/2018 ?S/p C4 of Brentuximab-CHP on  07/09/2018 ?S/p C5 of Brentuximab-CHP on  07/30/2018 ? ?07/01/18 PET/CT revealed Resolution of axillary, mediastinal, periaortic, and inguinal lymphadenopathy. ( [place] 1) 2. Decrease in size of spleen.  Normal metabolic activity. 3. Uniform increase in marrow metabolic activity is most consistent with GCSF type response.  ? ?09/24/18 PET/CT revealed No  evidence of residual or recurrent hypermetabolic lymphoma. ([place] 1). 2. 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 Recommend assessment of coronary risk factors and consideration of medical therapy. 3. Aortic ath

## 2022-02-07 ENCOUNTER — Telehealth: Payer: Self-pay | Admitting: Hematology

## 2022-02-07 NOTE — Telephone Encounter (Signed)
Scheduled follow-up appointment per 3/21 los. Patient is aware. Mailed calendar. ?

## 2022-02-12 ENCOUNTER — Encounter: Payer: Self-pay | Admitting: Hematology

## 2022-05-31 ENCOUNTER — Telehealth: Payer: Self-pay | Admitting: Hematology

## 2022-05-31 NOTE — Telephone Encounter (Signed)
Unable to leave message with rescheduled upcoming appointment due to provider's template. Mailed calendar.

## 2022-08-08 ENCOUNTER — Other Ambulatory Visit: Payer: Self-pay

## 2022-08-08 ENCOUNTER — Inpatient Hospital Stay (HOSPITAL_BASED_OUTPATIENT_CLINIC_OR_DEPARTMENT_OTHER): Payer: Medicare Other | Admitting: Hematology

## 2022-08-08 ENCOUNTER — Inpatient Hospital Stay: Payer: Medicare Other | Attending: Hematology

## 2022-08-08 VITALS — BP 150/66 | HR 76 | Temp 97.5°F | Resp 18 | Ht 61.0 in | Wt 128.5 lb

## 2022-08-08 DIAGNOSIS — C8440 Peripheral T-cell lymphoma, not classified, unspecified site: Secondary | ICD-10-CM | POA: Diagnosis not present

## 2022-08-08 DIAGNOSIS — C865 Angioimmunoblastic T-cell lymphoma: Secondary | ICD-10-CM | POA: Insufficient documentation

## 2022-08-08 DIAGNOSIS — F17210 Nicotine dependence, cigarettes, uncomplicated: Secondary | ICD-10-CM | POA: Insufficient documentation

## 2022-08-08 LAB — CBC WITH DIFFERENTIAL (CANCER CENTER ONLY)
Abs Immature Granulocytes: 0.01 10*3/uL (ref 0.00–0.07)
Basophils Absolute: 0 10*3/uL (ref 0.0–0.1)
Basophils Relative: 0 %
Eosinophils Absolute: 0.1 10*3/uL (ref 0.0–0.5)
Eosinophils Relative: 1 %
HCT: 42.3 % (ref 36.0–46.0)
Hemoglobin: 14.4 g/dL (ref 12.0–15.0)
Immature Granulocytes: 0 %
Lymphocytes Relative: 39 %
Lymphs Abs: 3.4 10*3/uL (ref 0.7–4.0)
MCH: 30.1 pg (ref 26.0–34.0)
MCHC: 34 g/dL (ref 30.0–36.0)
MCV: 88.5 fL (ref 80.0–100.0)
Monocytes Absolute: 0.7 10*3/uL (ref 0.1–1.0)
Monocytes Relative: 8 %
Neutro Abs: 4.5 10*3/uL (ref 1.7–7.7)
Neutrophils Relative %: 52 %
Platelet Count: 169 10*3/uL (ref 150–400)
RBC: 4.78 MIL/uL (ref 3.87–5.11)
RDW: 13.2 % (ref 11.5–15.5)
WBC Count: 8.7 10*3/uL (ref 4.0–10.5)
nRBC: 0 % (ref 0.0–0.2)

## 2022-08-08 LAB — CMP (CANCER CENTER ONLY)
ALT: 13 U/L (ref 0–44)
AST: 13 U/L — ABNORMAL LOW (ref 15–41)
Albumin: 4.5 g/dL (ref 3.5–5.0)
Alkaline Phosphatase: 53 U/L (ref 38–126)
Anion gap: 6 (ref 5–15)
BUN: 11 mg/dL (ref 8–23)
CO2: 28 mmol/L (ref 22–32)
Calcium: 9.7 mg/dL (ref 8.9–10.3)
Chloride: 103 mmol/L (ref 98–111)
Creatinine: 0.6 mg/dL (ref 0.44–1.00)
GFR, Estimated: >60 mL/min (ref >60)
Glucose, Bld: 154 mg/dL — ABNORMAL HIGH (ref 70–99)
Potassium: 4.3 mmol/L (ref 3.5–5.1)
Sodium: 137 mmol/L (ref 135–145)
Total Bilirubin: 0.5 mg/dL (ref 0.3–1.2)
Total Protein: 7.2 g/dL (ref 6.5–8.1)

## 2022-08-08 LAB — LACTATE DEHYDROGENASE: LDH: 140 U/L (ref 98–192)

## 2022-08-08 MED ORDER — TRIAMCINOLONE ACETONIDE 0.5 % EX OINT: 1.0000 | TOPICAL_OINTMENT | Freq: Two times a day (BID) | CUTANEOUS | 1 refills | Status: DC

## 2022-08-09 ENCOUNTER — Ambulatory Visit: Payer: No Typology Code available for payment source | Admitting: Hematology

## 2022-08-09 ENCOUNTER — Other Ambulatory Visit: Payer: No Typology Code available for payment source

## 2022-08-14 ENCOUNTER — Encounter: Payer: Self-pay | Admitting: Hematology

## 2022-08-14 NOTE — Progress Notes (Signed)
HEMATOLOGY/ONCOLOGY CLINIC NOTE  Date of Service: 08/08/2022  Patient Care Team: Patient, No Pcp Per as PCP - General ([place]) [name], MD (Unknown Physician Specialty)  CHIEF COMPLAINTS/PURPOSE OF CONSULTATION:  Follow-up for continued evaluation and management of angioimmunoblastic T-cell lymphoma  HISTORY OF PRESENTING ILLNESS:   Deborah Cooley is a wonderful 66 y.o. female who has been referred to [place] by Dr Joni Reining for evaluation and management of her newly diagnosed angioimmunoblastic T cell lymphoma.   Patient has a h/o HTN, endometriosis, tobacco use who was admitted to the hospital with shortness of breath and was subsequently found to have diffuse lymphadenopathy. She was also found to have bilateral pleural effusions and has had a thoracentesis with pleural fluid demonstrating atypical lymphocytosis. She was discharged but returned to the ED with complaints of persisting SOB, abdominal swelling, and lower extremity swelling.   She has had a  CTA chest and CT abd/pelvis which showed no PE.but extensive LNadenopathy in the chest and throughout the abd with b/l pleural effusion and mild ascites. Also noted to have narrowing in b/l common iliac arteries.  Clinically she is noted to have b/l LE edema 1-2+  Most recent lab results (05/05/18) of CBC  is as follows: all values are WNL except for RBC at 2.59, HGB at 7.7, HCT at 22.7, RDW at 16.0, PLT at 89k.  She has had a CT bone marrow biopsy on 05/05/2018 -results pending. ECHO was done and showed  Systolic function was   normal. The estimated ejection fraction was in the range of 60%   to 65%. Wall motion was normal; there were no regional wall   motion abnormalities. Left ventricular diastolic function   parameters were normal.  Port a cath placement requested.  Patient was transferred to [facility] with her consent to receive C1 of chemotherapy as inpatient.  INTERVAL HISTORY:   Deborah Cooley returns today for continued evaluation and management of angioimmunoblastic T-cell lymphoma.  She is accompanied by her husband for this visit.  She notes no acute new symptoms since her last clinic visit with [place].  No fevers no chills no night sweats no unexpected weight loss.  No new lumps or bumps.. Discussed and patient is disinclined to consider vaccinations. No new leg swelling or shortness of breath.  Labs done today were discussed in detail with the patient.   MEDICAL HISTORY:  Past Medical History:  Diagnosis Date   angioimmunoblastic lymphoma dx'd 03/2018   Bilateral pleural effusion 04/17/2018   Endometriosis    Essential hypertension, benign    Family history of adverse reaction to anesthesia    "sister stops breathing I think" (04/18/2018)   Heart murmur    Mixed hyperlipidemia    Pneumonia 04/17/2018    SURGICAL HISTORY: Past Surgical History:  Procedure Laterality Date   ABDOMINAL HYSTERECTOMY  1970s/1980s   for endometriosis   Arthroscopic right shoulder surgery     AXILLARY LYMPH NODE BIOPSY Left 04/21/2018   Procedure: LEFT AXILLARY LYMPH NODE BIOPSY;  Surgeon: Kieth Brightly Arta Bruce, MD;  Location: [facility];  Service: General;  Laterality: Left;   CARPAL TUNNEL RELEASE Right    GANGLION CYST EXCISION Right    IR IMAGING GUIDED PORT INSERTION  05/07/2018   IR REMOVAL TUN ACCESS W/ PORT W/O FL MOD SED  12/23/2018   MULTIPLE TOOTH [place]   PARTIAL HYSTERECTOMY  1984   Right hand surgery  SHOULDER OPEN ROTATOR CUFF REPAIR Right    VESICOVAGINAL FISTULA CLOSURE W/ TAH      SOCIAL HISTORY: Social History   Socioeconomic History   Marital status: Married    Spouse name: Not on file   Number of children: Not on file   Years of education: Not on file   Highest education level: Not on file  Occupational History   Occupation: Social research officer, government    Comment: works at [employer] Use   Smoking status: Every Day     Packs/day: 1.50    Years: 46.00    Total pack years: 69.00    Types: Cigarettes   Smokeless tobacco: Never  Vaping Use   Vaping Use: Never used  Substance and Sexual Activity   Alcohol use: Not Currently    Comment: 04/18/2018 "quit years ago"   Drug use: Not Currently    Comment: "back in my 45s"   Sexual activity: Not Currently  Other Topics Concern   Not on file  Social History Narrative   ** Merged History Encounter **       Patient has one son whom is healthy.   Has a live in boyfriend.   Social Determinants of Health   Financial Resource Strain: Not on file  Food Insecurity: Not on file  Transportation Needs: Not on file  Physical Activity: Not on file  Stress: Not on file  Social Connections: Not on file  Intimate Partner Violence: Not on file    FAMILY HISTORY: Family History  Problem Relation Age of Onset   Diabetes Mother        age 50   Heart attack Father        died age 29's   Hypertension Father    Cancer Sister        breast cancer diagonsed age 1 years   Diabetes Other    Heart disease Other        Female <55   Arthritis Other    Asthma Other    Hyperlipidemia Other        several siblings   Thyroid disease Sister        one sister with thyroid disease    ALLERGIES:  is allergic to [place] [zolpidem].  MEDICATIONS:  Current Outpatient Medications  Medication Sig Dispense Refill   Multiple Vitamin (MULTIVITAMIN WITH MINERALS) TABS tablet Take 1 tablet by mouth daily. (Patient not taking: Reported on 08/08/2022) 30 tablet 0   triamcinolone ointment (KENALOG) 0.5 % Apply 1 Application topically 2 (two) times daily. To rash over the forearms, legs and chest wall. Do not use on face. 30 g 1   No current facility-administered medications for this visit.    REVIEW OF SYSTEMS: 10 Point review of Systems was done is negative except as noted above.  PHYSICAL EXAMINATION: ECOG PERFORMANCE STATUS: 2 - Symptomatic, <50% confined to bed  .BP (!)  150/66 (BP Location: Left Arm, Patient Position: Sitting)   Pulse 76   Temp (!) 97.5 F (36.4 C) (Temporal)   Resp 18   Ht '5\' 1"'  (1.549 m)   Wt 128 lb 8 oz (58.3 kg)   SpO2 96%   BMI 24.28 kg/m   NAD GENERAL:alert, in no acute distress and comfortable SKIN: no acute rashes, no significant lesions EYES: conjunctiva are pink and non-injected, sclera anicteric OROPHARYNX: MMM, no exudates, no oropharyngeal erythema or ulceration NECK: supple, no JVD LYMPH:  no palpable lymphadenopathy in the cervical, axillary or inguinal regions  LUNGS: clear to auscultation b/l with normal respiratory effort HEART: regular rate & rhythm ABDOMEN:  normoactive bowel sounds , non tender, not distended. Extremity: no pedal edema PSYCH: alert & oriented x 3 with fluent speech NEURO: no focal motor/sensory deficits    LABORATORY DATA:  I have reviewed the data as listed     Latest Ref Rng & Units 08/08/2022   10:23 AM 02/06/2022    1:44 PM 08/09/2021    1:39 PM  CBC  WBC 4.0 - 10.5 K/uL 8.7  8.0  8.3   Hemoglobin 12.0 - 15.0 g/dL 14.4  14.1  14.8   Hematocrit 36.0 - 46.0 % 42.3  41.1  43.2   Platelets 150 - 400 K/uL 169  152  153   ANC 700  CBC    Component Value Date/Time   WBC 8.7 08/08/2022 1023   WBC 8.3 08/09/2021 1339   RBC 4.78 08/08/2022 1023   HGB 14.4 08/08/2022 1023   HCT 42.3 08/08/2022 1023   PLT 169 08/08/2022 1023   MCV 88.5 08/08/2022 1023   MCH 30.1 08/08/2022 1023   MCHC 34.0 08/08/2022 1023   RDW 13.2 08/08/2022 1023   LYMPHSABS 3.4 08/08/2022 1023   MONOABS 0.7 08/08/2022 1023   EOSABS 0.1 08/08/2022 1023   BASOSABS 0.0 08/08/2022 1023       Latest Ref Rng & Units 08/08/2022   10:23 AM 02/06/2022    1:44 PM 08/09/2021    1:39 PM  CMP  Glucose 70 - 99 mg/dL 154  145  203   BUN 8 - 23 mg/dL '11  11  11   ' Creatinine 0.44 - 1.00 mg/dL 0.60  0.65  0.76   Sodium 135 - 145 mmol/L 137  135  137   Potassium 3.5 - 5.1 mmol/L 4.3  4.1  3.8   Chloride 98 - 111 mmol/L  103  100  102   CO2 22 - 32 mmol/L '28  27  24   ' Calcium 8.9 - 10.3 mg/dL 9.7  10.4  10.2   Total Protein 6.5 - 8.1 g/dL 7.2  7.3  7.4   Total Bilirubin 0.3 - 1.2 mg/dL 0.5  0.5  0.5   Alkaline Phos 38 - 126 U/L 53  60  71   AST 15 - 41 U/L '13  14  17   ' ALT 0 - 44 U/L '13  15  20     ' Lab Results  Component Value Date   LDH 140 08/08/2022      Component     Latest Ref Rng & Units 04/18/2018 05/03/2018  Hepatitis B Surface Ag     Negative  Negative  HCV Ab     0.0 - 0.9 s/co ratio  <0.1  Hep A Ab, IgM     Negative  Negative  Hep B Core Ab, IgM     Negative  Negative  HIV Screen 4th Generation wRfx     Non Reactive Non Reactive     04/21/18 Molecular Pathology:   04/21/18 Surgical Pathology:    RADIOGRAPHIC STUDIES: I have personally reviewed the radiological images as listed and agreed with the findings in the report. No results found.  ASSESSMENT & PLAN:   66 y.o. female with   1. 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 S/P C1 OF CHOP on 05/08/2018 S/p C2 of Brentuximab-CHP on 05/28/2018  S/p C3 of Brentuximab-CHP on 06/18/2018 S/p C4  of Brentuximab-CHP on  07/09/2018 S/p C5 of Brentuximab-CHP on  07/30/2018  07/01/18 PET/CT revealed Resolution of axillary, mediastinal, periaortic, and inguinal lymphadenopathy. ( [place] 1) 2. Decrease in size of spleen.  Normal metabolic activity. 3. Uniform increase in marrow metabolic activity is most consistent with GCSF type response.   09/24/18 PET/CT revealed No evidence of residual or recurrent hypermetabolic lymphoma. ([place] 1). 2. 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 Recommend assessment of coronary risk factors and consideration of medical therapy. 3. Aortic atherosclerosis and emphysema.  Discussed the option to pursue BM transplant after completing treatment and that I would be happy to refer the pt to [facility] for a discussion with a  transplant team - this was done but not possible due to lack of insurance. Then referred pt to [facility], and this also was not possible due to lack of insurance.  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  08/13/2019 CT C/A/P ([id number]) revealed "1. No recurrent adenopathy to suggest active malignancy. 2. Subtle ground-glass nodularity medially at the left lung apex, likely inflammatory. 3. Other imaging findings of potential clinical significance: Aortic Atherosclerosis (ICD10-I70.0). Coronary atherosclerosis. Emphysema (ICD10-J43.9). Airway thickening is present, suggesting bronchitis or reactive airways disease. Stable calcified right thyroid nodule. 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 General LNadenopathy from lymphoma with b/l pleural effusion and b/l lower extremity weakness- resolved   3. Anemia/thrombocytopenia -  from Bone marrow involvement by lymphoma + Chemotherapy BM Bx confirms involved by T cell lymphoma. Anemia now resolved. Mild stable thrombocytopenia  4. 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 LDH wnl no evidnece of overt hemolysis at this timne    5. 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) CXR 6/21 s/p rpt therapeutic thoracentesis on 05/11/2018 - resolved on PET/CT   6 .h/o RUE DVT - US 05/13/2018  PLAN: -Discussed patient's labs from today.  CBC CMP stable.  LDH within normal limits. -Patient has no lab or clinical evidence of lymphoma recurrence/progression at this time. -Patient has no new focal symptoms suggestive of lymphoma recurrence. Patient was recommended staying up-to-date with her vaccinations including the flu shot, RSV vaccination and COVID-19 vaccines.  Patient declines vaccinations at this time. -She continues to smoke and we discussed smoking cessation. - FOLLOW UP: Labs today RTC with Dr Irene Limbo with labs in 6 months  The total time spent in the appointment was 21  minutes*.  All of the patient's questions were answered with apparent satisfaction. The patient knows to call the clinic with any problems, questions or concerns.   Sullivan Lone MD MS AAHIVMS [facility] Hematology/Oncology Physician [facility]  .*Total Encounter Time as defined by the Centers for Medicare and Medicaid Services includes, in addition to the face-to-face time of a patient visit (documented in the note above) non-face-to-face time: obtaining and reviewing outside history, ordering and reviewing medications, tests or procedures, care coordination (communications with other health care professionals or caregivers) and documentation in the medical record.

## 2022-08-24 ENCOUNTER — Telehealth: Payer: Self-pay | Admitting: Hematology

## 2022-08-24 NOTE — Telephone Encounter (Signed)
Left message with follow-up appointment per 9/20 los. 

## 2023-02-06 ENCOUNTER — Other Ambulatory Visit: Payer: Self-pay

## 2023-02-06 ENCOUNTER — Inpatient Hospital Stay: Payer: Medicare Other

## 2023-02-06 ENCOUNTER — Inpatient Hospital Stay: Payer: Medicare Other | Attending: Hematology | Admitting: Hematology

## 2023-02-06 VITALS — BP 148/63 | HR 70 | Temp 97.7°F | Resp 18 | Ht 61.0 in | Wt 127.1 lb

## 2023-02-06 DIAGNOSIS — C8440 Peripheral T-cell lymphoma, not classified, unspecified site: Secondary | ICD-10-CM

## 2023-02-06 DIAGNOSIS — L989 Disorder of the skin and subcutaneous tissue, unspecified: Secondary | ICD-10-CM

## 2023-02-06 DIAGNOSIS — F17210 Nicotine dependence, cigarettes, uncomplicated: Secondary | ICD-10-CM | POA: Insufficient documentation

## 2023-02-06 DIAGNOSIS — C865 Angioimmunoblastic T-cell lymphoma: Secondary | ICD-10-CM | POA: Insufficient documentation

## 2023-02-06 LAB — CBC WITH DIFFERENTIAL (CANCER CENTER ONLY)
Abs Immature Granulocytes: 0.01 10*3/uL (ref 0.00–0.07)
Basophils Absolute: 0 10*3/uL (ref 0.0–0.1)
Basophils Relative: 1 %
Eosinophils Absolute: 0.1 10*3/uL (ref 0.0–0.5)
Eosinophils Relative: 2 %
HCT: 40 % (ref 36.0–46.0)
Hemoglobin: 13.6 g/dL (ref 12.0–15.0)
Immature Granulocytes: 0 %
Lymphocytes Relative: 44 %
Lymphs Abs: 3.1 10*3/uL (ref 0.7–4.0)
MCH: 30 pg (ref 26.0–34.0)
MCHC: 34 g/dL (ref 30.0–36.0)
MCV: 88.3 fL (ref 80.0–100.0)
Monocytes Absolute: 0.7 10*3/uL (ref 0.1–1.0)
Monocytes Relative: 9 %
Neutro Abs: 3.1 10*3/uL (ref 1.7–7.7)
Neutrophils Relative %: 44 %
Platelet Count: 149 10*3/uL — ABNORMAL LOW (ref 150–400)
RBC: 4.53 MIL/uL (ref 3.87–5.11)
RDW: 13.1 % (ref 11.5–15.5)
WBC Count: 7 10*3/uL (ref 4.0–10.5)
nRBC: 0 % (ref 0.0–0.2)

## 2023-02-06 LAB — CMP (CANCER CENTER ONLY)
ALT: 13 U/L (ref 0–44)
AST: 13 U/L — ABNORMAL LOW (ref 15–41)
Albumin: 4.5 g/dL (ref 3.5–5.0)
Alkaline Phosphatase: 56 U/L (ref 38–126)
Anion gap: 6 (ref 5–15)
BUN: 9 mg/dL (ref 8–23)
CO2: 29 mmol/L (ref 22–32)
Calcium: 10 mg/dL (ref 8.9–10.3)
Chloride: 103 mmol/L (ref 98–111)
Creatinine: 0.66 mg/dL (ref 0.44–1.00)
GFR, Estimated: >60 mL/min (ref >60)
Glucose, Bld: 151 mg/dL — ABNORMAL HIGH (ref 70–99)
Potassium: 4.4 mmol/L (ref 3.5–5.1)
Sodium: 138 mmol/L (ref 135–145)
Total Bilirubin: 0.4 mg/dL (ref 0.3–1.2)
Total Protein: 7.1 g/dL (ref 6.5–8.1)

## 2023-02-06 LAB — LACTATE DEHYDROGENASE: LDH: 117 U/L (ref 98–192)

## 2023-02-06 MED ORDER — TRIAMCINOLONE ACETONIDE 0.5 % EX OINT: 1.0000 | TOPICAL_OINTMENT | Freq: Two times a day (BID) | CUTANEOUS | 1 refills | Status: DC

## 2023-02-06 MED ORDER — CLOTRIMAZOLE 1 % EX OINT: 1.0000 | TOPICAL_OINTMENT | Freq: Two times a day (BID) | CUTANEOUS | 1 refills | Status: DC

## 2023-02-06 NOTE — Progress Notes (Signed)
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 female who has been referred to [place] by Dr Joni Reining for evaluation and management of her newly diagnosed angioimmunoblastic T cell lymphoma.   Patient has a h/o HTN, endometriosis, tobacco use who was admitted to the hospital with shortness of breath and was subsequently found to have diffuse lymphadenopathy. She was also found to have bilateral pleural effusions and has had a thoracentesis with pleural fluid demonstrating atypical lymphocytosis. She was discharged but returned to the ED with complaints of persisting SOB, abdominal swelling, and lower extremity swelling.   She has had a  CTA chest and CT abd/pelvis which showed no PE.but extensive LNadenopathy in the chest and throughout the abd with b/l pleural effusion and mild ascites. Also noted to have narrowing in b/l common iliac arteries.  Clinically she is noted to have b/l LE edema 1-2+  Most recent lab results (05/05/18) of CBC  is as follows: all values are WNL except for RBC at 2.59, HGB at 7.7, HCT at 22.7, RDW at 16.0, PLT at 89k.  She has had a CT bone marrow biopsy on 05/05/2018 -results pending. ECHO was done and showed  Systolic function was   normal. The estimated ejection fraction was in the range of 60%   to 65%. Wall motion was normal; there were no regional wall   motion abnormalities. Left ventricular diastolic function   parameters were normal.  Port a cath placement requested.  Patient was transferred to [facility] with her consent to receive C1 of chemotherapy as inpatient.  INTERVAL HISTORY:   Deborah Cooley returns today for continued evaluation and management of angioimmunoblastic T-cell lymphoma. Patient was last seen by me on 08/08/2022 and was doing well overall with no new medical concerns.  Today, she is accompanied by her husband. She does complain of some intermittent left lower back pain beginning after being injected with a needle during her spinal tap procedure while in the hospital previously. Her pain is worsened with straining her back but does not radiate to other areas. She does report that she was working around her friend's house before the pain began.  She does report that her eczema has improved with topical cream. Her eczema does worsen in the summertime especially when she feels hot. She does complain of a worsening rash on her nose, which had been present for a while previously. She notes that this rash is different form her previous rashes.  She denies any other new concerns/symptoms of the last six months.She denies any  change in diet/appetite, new SOB/chest pain, sudden weight loss, fever, chills, lumps/bumps, abdominal pain, or new leg swelling. Patient has endorsed hot flashes since age 72. Patient continues to smoke half a pack a day.  MEDICAL HISTORY:  Past Medical History:  Diagnosis Date   angioimmunoblastic lymphoma dx'd 03/2018   Bilateral pleural effusion 04/17/2018   Endometriosis    Essential hypertension, benign    Family history of adverse reaction to anesthesia    "sister stops breathing I think" (04/18/2018)   Heart murmur    Mixed hyperlipidemia    Pneumonia 04/17/2018    SURGICAL HISTORY: Past Surgical History:  Procedure Laterality Date   ABDOMINAL HYSTERECTOMY  1970s/1980s   for endometriosis   Arthroscopic right shoulder surgery     AXILLARY LYMPH NODE BIOPSY Left 04/21/2018   Procedure: LEFT AXILLARY LYMPH NODE BIOPSY;  Surgeon: Kieth Brightly, Arta Bruce, MD;  Location: [facility];  Service: General;  Laterality: Left;   CARPAL TUNNEL RELEASE  Right    GANGLION CYST EXCISION Right    IR IMAGING GUIDED PORT INSERTION  05/07/2018   IR REMOVAL TUN ACCESS W/ PORT W/O FL MOD SED  12/23/2018   MULTIPLE TOOTH EXTRACTIONS     OOPHORECTOMY  1997   PARTIAL HYSTERECTOMY  1984   Right hand surgery     SHOULDER OPEN ROTATOR CUFF REPAIR Right    VESICOVAGINAL FISTULA CLOSURE W/ TAH      SOCIAL HISTORY: Social History   Socioeconomic History   Marital status: Married    Spouse name: Not on file   Number of children: Not on file   Years of education: Not on file   Highest education level: Not on file  Occupational History   Occupation: Social research officer, government    Comment: works at [employer] Use   Smoking status: Every Day    Packs/day: 1.50    Years: 46.00    Additional pack years: 0.00    Total pack years: 69.00    Types: Cigarettes   Smokeless tobacco: Never  Vaping Use   Vaping Use: Never used  Substance and Sexual Activity   Alcohol use: Not Currently    Comment: 04/18/2018 "quit years ago"   Drug use: Not Currently    Comment: "back in my 55s"   Sexual activity: Not Currently  Other Topics Concern   Not on file  Social History Narrative   ** Merged History Encounter **       Patient has one son whom is healthy.   Has a live in boyfriend.   Social Determinants of Health   Financial Resource Strain: Not on file  Food Insecurity: Not on file  Transportation Needs: Not on file  Physical Activity: Not on file  Stress: Not on file  Social Connections: Not on file  Intimate Partner Violence: Not on file    FAMILY HISTORY: Family History  Problem Relation Age of Onset   Diabetes Mother        age 11   Heart attack Father        died age 20's   Hypertension Father    Cancer Sister        breast cancer diagonsed age 49 years   Diabetes Other    Heart disease Other        Female <55   Arthritis Other    Asthma Other    Hyperlipidemia Other        several siblings   Thyroid disease Sister         one sister with thyroid disease    ALLERGIES:  is allergic to [place] [zolpidem].  MEDICATIONS:  Current Outpatient Medications  Medication Sig Dispense Refill   Multiple Vitamin (MULTIVITAMIN WITH MINERALS) TABS tablet Take 1 tablet by mouth daily. (Patient not taking: Reported on 08/08/2022) 30 tablet 0   triamcinolone ointment (KENALOG) 0.5 % Apply 1 Application topically 2 (two) times daily. To rash over the forearms, legs and chest wall. Do not use on face. 30 g 1   No current facility-administered medications for this visit.    REVIEW OF SYSTEMS:   10 Point review  of Systems was done is negative except as noted above.   PHYSICAL EXAMINATION: ECOG PERFORMANCE STATUS: 2 - Symptomatic, <50% confined to bed  .BP (!) 148/63 (BP Location: Left Arm, Patient Position: Sitting)   Pulse 70   Temp 97.7 F (36.5 C) (Tympanic)   Resp 18   Ht 5\' 1"  (1.549 m)   Wt 127 lb 1.6 oz (57.7 kg)   SpO2 100%   BMI 24.02 kg/m   GENERAL:alert, in no acute distress and comfortable SKIN: no acute rashes, no significant lesions EYES: conjunctiva are pink and non-injected, sclera anicteric OROPHARYNX: MMM, no exudates, no oropharyngeal erythema or ulceration NECK: supple, no JVD LYMPH:  no palpable lymphadenopathy in the cervical, axillary or inguinal regions LUNGS: clear to auscultation b/l with normal respiratory effort HEART: regular rate & rhythm ABDOMEN:  normoactive bowel sounds , non tender, not distended. Extremity: no pedal edema PSYCH: alert & oriented x 3 with fluent speech NEURO: no focal motor/sensory deficits   LABORATORY DATA:  I have reviewed the data as listed     Latest Ref Rng & Units 02/06/2023    9:33 AM 08/08/2022   10:23 AM 02/06/2022    1:44 PM  CBC  WBC 4.0 - 10.5 K/uL 7.0  8.7  8.0   Hemoglobin 12.0 - 15.0 g/dL 13.6  14.4  14.1   Hematocrit 36.0 - 46.0 % 40.0  42.3  41.1   Platelets 150 - 400 K/uL 149  169  152   ANC 700  CBC    Component Value Date/Time    WBC 8.7 08/08/2022 1023   WBC 8.3 08/09/2021 1339   RBC 4.78 08/08/2022 1023   HGB 14.4 08/08/2022 1023   HCT 42.3 08/08/2022 1023   PLT 169 08/08/2022 1023   MCV 88.5 08/08/2022 1023   MCH 30.1 08/08/2022 1023   MCHC 34.0 08/08/2022 1023   RDW 13.2 08/08/2022 1023   LYMPHSABS 3.4 08/08/2022 1023   MONOABS 0.7 08/08/2022 1023   EOSABS 0.1 08/08/2022 1023   BASOSABS 0.0 08/08/2022 1023       Latest Ref Rng & Units 02/06/2023    9:33 AM 08/08/2022   10:23 AM 02/06/2022    1:44 PM  CMP  Glucose 70 - 99 mg/dL 151  154  145   BUN 8 - 23 mg/dL 9  11  11    Creatinine 0.44 - 1.00 mg/dL 0.66  0.60  0.65   Sodium 135 - 145 mmol/L 138  137  135   Potassium 3.5 - 5.1 mmol/L 4.4  4.3  4.1   Chloride 98 - 111 mmol/L 103  103  100   CO2 22 - 32 mmol/L 29  28  27    Calcium 8.9 - 10.3 mg/dL 10.0  9.7  10.4   Total Protein 6.5 - 8.1 g/dL 7.1  7.2  7.3   Total Bilirubin 0.3 - 1.2 mg/dL 0.4  0.5  0.5   Alkaline Phos 38 - 126 U/L 56  53  60   AST 15 - 41 U/L 13  13  14    ALT 0 - 44 U/L 13  13  15      Lab Results  Component Value Date   LDH 117 02/06/2023      Component     Latest Ref Rng & Units 04/18/2018 05/03/2018  Hepatitis B Surface Ag     Negative  Negative  HCV Ab     0.0 - 0.9 s/co ratio  <0.1  Hep A  Ab, IgM     Negative  Negative  Hep B Core Ab, IgM     Negative  Negative  HIV Screen 4th Generation wRfx     Non Reactive Non Reactive     04/21/18 Molecular Pathology:   04/21/18 Surgical Pathology:    RADIOGRAPHIC STUDIES: I have personally reviewed the radiological images as listed and agreed with the findings in the report. No results found.  ASSESSMENT & PLAN:   67 y.o. female with   1. 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 S/P C1 OF CHOP on 05/08/2018 S/p C2 of Brentuximab-CHP on 05/28/2018  S/p C3 of Brentuximab-CHP on 06/18/2018 S/p C4 of Brentuximab-CHP on  07/09/2018 S/p C5 of  Brentuximab-CHP on  07/30/2018  07/01/18 PET/CT revealed Resolution of axillary, mediastinal, periaortic, and inguinal lymphadenopathy. ( [place] 1) 2. Decrease in size of spleen.  Normal metabolic activity. 3. Uniform increase in marrow metabolic activity is most consistent with GCSF type response.   09/24/18 PET/CT revealed No evidence of residual or recurrent hypermetabolic lymphoma. ([place] 1). 2. 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 Recommend assessment of coronary risk factors and consideration of medical therapy. 3. Aortic atherosclerosis and emphysema.  Discussed the option to pursue BM transplant after completing treatment and that I would be happy to refer the pt to [facility] for a discussion with a transplant team - this was done but not possible due to lack of insurance. Then referred pt to [facility], and this also was not possible due to lack of insurance.  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  08/13/2019 CT C/A/P ([id number]) revealed "1. No recurrent adenopathy to suggest active malignancy. 2. Subtle ground-glass nodularity medially at the left lung apex, likely inflammatory. 3. Other imaging findings of potential clinical significance: Aortic Atherosclerosis (ICD10-I70.0). Coronary atherosclerosis. Emphysema (ICD10-J43.9). Airway thickening is present, suggesting bronchitis or reactive airways disease. Stable calcified right thyroid nodule. 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 General LNadenopathy from lymphoma with b/l pleural effusion and b/l lower extremity weakness- resolved   3. Anemia/thrombocytopenia -  from Bone marrow involvement by lymphoma + Chemotherapy BM Bx confirms involved by T cell lymphoma. Anemia now resolved. Mild stable thrombocytopenia  4. 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 LDH wnl no evidnece of overt hemolysis at this timne    5. H/o Large rt pleural effusion related to lymphoma  (AITCL) CXR 6/21 s/p rpt therapeutic thoracentesis on 05/11/2018 - resolved on PET/CT   6 .h/o RUE DVT - US 05/13/2018  PLAN:  -Discussed lab results on 02/06/2023 with patient in detail. CBC showed WBC of 7.0K, hemoglobin of 13.6, and platelets of 149K. -other labs pending -Refer patient to dermatologist to rule out any skin cancers related with her rash. Patient would like to try an antifungal cream first. -order topical antifungal cream -advised pt to get a PCP, though patient is disinclined at this time -answered all of patient's questions in detail -Recommend OTC SI brace or Voltaren gel to improve back pain -Recommend patient to stay UTD with cancer screenings, though patient is disinclined to proceed with them -Informed patient that if back pain is new/worsened would recommend further evaluation to rule out any other concerns. Pain may be related to a disc issue.  -Patient has no lab or clinical evidence of lymphoma recurrence/progression at this time. -Patient has no new focal symptoms suggestive of lymphoma recurrence. -RTC with  labs in one year  FOLLOW-UP: -Referral to dermatology @ [facility] for worsening scaly lesion on nose and left temple. -RTC with Dr Irene Limbo with labs in 12 months  The total time spent in the appointment was 25 minutes* .  All of the patient's questions were answered with apparent satisfaction. The patient knows to call the clinic with any problems, questions or concerns.   Sullivan Lone MD MS AAHIVMS [facility] Hematology/Oncology Physician [facility]  .*Total Encounter Time as defined by the Centers for Medicare and Medicaid Services includes, in addition to the face-to-face time of a patient visit (documented in the note above) non-face-to-face time: obtaining and reviewing outside history, ordering and reviewing medications, tests or procedures, care coordination (communications with other health care professionals or caregivers) and  documentation in the medical record.   I,Mitra Faeizi,acting as a Education administrator for Sullivan Lone, MD.,have documented all relevant documentation on the behalf of Sullivan Lone, MD,as directed by  Sullivan Lone, MD while in the presence of Sullivan Lone, MD.  .I have reviewed the above documentation for accuracy and completeness, and I agree with the above. Brunetta Genera MD

## 2023-02-07 ENCOUNTER — Telehealth: Payer: Self-pay | Admitting: Hematology

## 2023-02-07 NOTE — Telephone Encounter (Signed)
Called patient per 3/20 los notes to schedule f/u. Left voicemail with new appointment information and contact details if needing to reschedule.

## 2023-02-12 ENCOUNTER — Encounter: Payer: Self-pay | Admitting: Hematology

## 2023-04-25 ENCOUNTER — Encounter: Payer: Self-pay | Admitting: Hematology

## 2023-05-02 ENCOUNTER — Ambulatory Visit: Payer: Medicare Other | Admitting: Dermatology

## 2023-05-02 VITALS — BP 159/100 | HR 87

## 2023-05-02 DIAGNOSIS — C44311 Basal cell carcinoma of skin of nose: Secondary | ICD-10-CM | POA: Diagnosis not present

## 2023-05-02 DIAGNOSIS — L570 Actinic keratosis: Secondary | ICD-10-CM

## 2023-05-02 DIAGNOSIS — D485 Neoplasm of uncertain behavior of skin: Secondary | ICD-10-CM

## 2023-05-02 DIAGNOSIS — L0102 Bockhart's impetigo: Secondary | ICD-10-CM | POA: Diagnosis not present

## 2023-05-02 DIAGNOSIS — W908XXA Exposure to other nonionizing radiation, initial encounter: Secondary | ICD-10-CM

## 2023-05-02 DIAGNOSIS — Z808 Family history of malignant neoplasm of other organs or systems: Secondary | ICD-10-CM

## 2023-05-02 DIAGNOSIS — C4491 Basal cell carcinoma of skin, unspecified: Secondary | ICD-10-CM

## 2023-05-02 DIAGNOSIS — C44310 Basal cell carcinoma of skin of unspecified parts of face: Secondary | ICD-10-CM

## 2023-05-02 HISTORY — DX: Basal cell carcinoma of skin of unspecified parts of face: C44.310

## 2023-05-02 HISTORY — DX: Basal cell carcinoma of skin, unspecified: C44.91

## 2023-05-02 MED ORDER — ADAPALENE 0.3 % EX GEL: 1.0000 | Freq: Every day | CUTANEOUS | 3 refills | Status: DC

## 2023-05-02 MED ORDER — NYSTATIN-TRIAMCINOLONE 100000-0.1 UNIT/GM-% EX OINT: 1.0000 | TOPICAL_OINTMENT | Freq: Two times a day (BID) | CUTANEOUS | 3 refills | Status: DC

## 2023-05-02 NOTE — Patient Instructions (Addendum)
Patient Handout: Wound Care for Skin Biopsy Site  Patient Handout: Wound Care for Skin Biopsy Site  Taking Care of Your Skin Biopsy Site  Proper care of the biopsy site is essential for promoting healing and minimizing scarring. This handout provides instructions on how to care for your biopsy site to ensure optimal recovery.  1. Cleaning the Wound:  Clean the biopsy site daily with gentle soap and water. Gently pat the area dry with a clean, soft towel. Avoid harsh scrubbing or rubbing the area, as this can irritate the skin and delay healing.  2. Applying Aquaphor and Bandage:  After cleaning the wound, apply a thin layer of Aquaphor ointment to the biopsy site. Cover the area with a sterile bandage to protect it from dirt, bacteria, and friction. Change the bandage daily or as needed if it becomes soiled or wet.  3. Continued Care for One Week:  Repeat the cleaning, Aquaphor application, and bandaging process daily for one week following the biopsy procedure. Keeping the wound clean and moist during this initial healing period will help prevent infection and promote optimal healing.  4. Massaging Aquaphor into the Area:  ---After one week, discontinue the use of bandages but continue to apply Aquaphor to the biopsy site. ----Gently massage the Aquaphor into the area using circular motions. ---Massaging the skin helps to promote circulation and prevent the formation of scar tissue.   Additional Tips:  Avoid exposing the biopsy site to direct sunlight during the healing process, as this can cause hyperpigmentation or worsen scarring. If you experience any signs of infection, such as increased redness, swelling, warmth, or drainage from the wound, contact your healthcare provider immediately. Follow any additional instructions provided by your healthcare provider for caring for the biopsy site and managing any discomfort. Conclusion:  Taking proper care of your skin biopsy site  is crucial for ensuring optimal healing and minimizing scarring. By following these instructions for cleaning, applying Aquaphor, and massaging the area, you can promote a smooth and successful recovery. If you have any questions or concerns about caring for your biopsy site, don't hesitate to contact your healthcare provider for guidance.    - Nystatin-Triamcinolone Am and Night for 3 weeks then STOP - Adapelen mixed at night with the Nystatin-Triamcinolone  If area not cleared then you may resume: Nystatin-Triamcinolone Am and Night for 3 weeks then STOP   Due to recent changes in healthcare laws, you may see results of your pathology and/or laboratory studies on MyChart before the doctors have had a chance to review them. We understand that in some cases there may be results that are confusing or concerning to you. Please understand that not all results are received at the same time and often the doctors may need to interpret multiple results in order to provide you with the best plan of care or course of treatment. Therefore, we ask that you please give [place] 2 business days to thoroughly review all your results before contacting the office for clarification. Should we see a critical lab result, you will be contacted sooner.   If You Need Anything After Your Visit  If you have any questions or concerns for your doctor, please call our main line at [phone] If no one answers, please leave a voicemail as directed and we will return your call as soon as possible. Messages left after 4 pm will be answered the following business day.   You may also send [place] a message via MyChart. We typically  respond to MyChart messages within 1-2 business days.  For prescription refills, please ask your pharmacy to contact our office. Our fax number is [phone].  If you have an urgent issue when the clinic is closed that cannot wait until the next business day, you can page your doctor at the number below.     Please note that while we do our best to be available for urgent issues outside of office hours, we are not available 24/7.   If you have an urgent issue and are unable to reach [place], you may choose to seek medical care at your doctor's office, retail clinic, urgent care center, or emergency room.  If you have a medical emergency, please immediately call 911 or go to the emergency department. In the event of inclement weather, please call our main line at [phone] for an update on the status of any delays or closures.  Dermatology Medication Tips: Please keep the boxes that topical medications come in in order to help keep track of the instructions about where and how to use these. Pharmacies typically print the medication instructions only on the boxes and not directly on the medication tubes.   If your medication is too expensive, please contact our office at [phone] or send [place] a message through MyChart.   We are unable to tell what your co-pay for medications will be in advance as this is different depending on your insurance coverage. However, we may be able to find a substitute medication at lower cost or fill out paperwork to get insurance to cover a needed medication.   If a prior authorization is required to get your medication covered by your insurance company, please allow [place] 1-2 business days to complete this process.  Drug prices often vary depending on where the prescription is filled and some pharmacies may offer cheaper prices.  The website www.goodrx.com contains coupons for medications through different pharmacies. The prices here do not account for what the cost may be with help from insurance (it may be cheaper with your insurance), but the website can give you the price if you did not use any insurance.  - You can print the associated coupon and take it with your prescription to the pharmacy.  - You may also stop by our office during regular business hours and pick  up a GoodRx coupon card.  - If you need your prescription sent electronically to a different pharmacy, notify our office through [facility] or by phone at [phone]

## 2023-05-02 NOTE — Progress Notes (Signed)
New Patient Visit   Subjective  Deborah Cooley is a 67 y.o. female accompanied by her husband who presents for the following: Lesion on face  Patient states she has lesion on nose, rash located at the chest that she would like to have examined. Patient reports the areas have been there for  1  year(s). She reports the areas are bothersome. She states that the areas have spread. Patient reports has previously been treated for these areas. Patient denies Hx of bx. Patient skin family history of skin cancer(s).    The following portions of the chart were reviewed this encounter and updated as appropriate: medications, allergies, medical history  Review of Systems:  No other skin or systemic complaints except as noted in HPI or Assessment and Plan.  Objective  Well appearing patient in no apparent distress; mood and affect are within normal limits.  A focused examination was performed of the following areas: Face and chest  Relevant exam findings are noted in the Assessment and Plan.  Dorsum of Nosex1, Left Cheek x1, Left Temple x1 (3) Erythematous thin papules/macules with gritty scale.              Assessment & Plan   Neoplasm of uncertain behavior of skin (2) Mid Tip of Nose  Skin / nail biopsy Type of biopsy: tangential   Informed consent: discussed and consent obtained   Timeout: patient name, date of birth, surgical site, and procedure verified   Procedure prep:  Patient was prepped and draped in usual sterile fashion Prep type:  Isopropyl alcohol Anesthesia: the lesion was anesthetized in a standard fashion   Anesthetic:  1% lidocaine w/ epinephrine 1-100,000 buffered w/ 8.4% NaHCO3 Instrument used: DermaBlade   Hemostasis achieved with: aluminum chloride   Outcome: patient tolerated procedure well   Post-procedure details: sterile dressing applied and wound care instructions given   Dressing type: petrolatum gauze and bandage    Specimen 1 - Surgical  pathology Differential Diagnosis: BCC vs SCC  Check Margins: No  A: Tip of Nose  Left Nasal Crease  Skin / nail biopsy Type of biopsy: tangential   Informed consent: discussed and consent obtained   Timeout: patient name, date of birth, surgical site, and procedure verified   Procedure prep:  Patient was prepped and draped in usual sterile fashion Prep type:  Isopropyl alcohol Anesthesia: the lesion was anesthetized in a standard fashion   Anesthetic:  1% lidocaine w/ epinephrine 1-100,000 buffered w/ 8.4% NaHCO3 Instrument used: DermaBlade   Hemostasis achieved with: aluminum chloride   Outcome: patient tolerated procedure well   Post-procedure details: sterile dressing applied and wound care instructions given   Dressing type: petrolatum gauze and bandage    Specimen 2 - Surgical pathology Differential Diagnosis: BCC vs SCC  Check Margins: No  B: Nasal Crease  AK (actinic keratosis) (3) Dorsum of Nosex1, Left Cheek x1, Left Temple x1  Destruction of lesion - Dorsum of Nosex1, Left Cheek x1, Left Temple x1 Complexity: simple   Destruction method: cryotherapy   Informed consent: discussed and consent obtained   Timeout:  patient name, date of birth, surgical site, and procedure verified Lesion destroyed using liquid nitrogen: Yes   Region frozen until ice ball extended beyond lesion: Yes   Outcome: patient tolerated procedure well with no complications   Post-procedure details: wound care instructions given    Perifolliculitis  Related Medications nystatin-triamcinolone ointment (MYCOLOG) Apply 1 Application topically 2 (two) times daily.  Adapalene (DIFFERIN) 0.3 %  gel Apply 1 Application topically at bedtime.     Peri Folliculitis  Exam:Perifollicular Pink/orange papules with scales  Treatment plan: - Nystatin-Triamcinolone Am and Night for 3 weeks then STOP - Adapelen mixed at night with the Nystatin-Triamcinolone  Return in about 3 months (around  08/02/2023) for Peri-folliculitis f/u.   Documentation: I have reviewed the above documentation for accuracy and completeness, and I agree with the above.  Stasia Cavalier, am acting as scribe for Langston Reusing, DO.  Langston Reusing, DO

## 2023-05-07 DIAGNOSIS — C44310 Basal cell carcinoma of skin of unspecified parts of face: Secondary | ICD-10-CM | POA: Insufficient documentation

## 2023-05-08 ENCOUNTER — Telehealth: Payer: Self-pay

## 2023-05-08 NOTE — Telephone Encounter (Signed)
-----   Message from Terri Piedra, DO sent at 05/08/2023  4:09 PM EDT ----- Forde Dandy,  Please call patient and notify that results of biopsy were positive for a skin cancer that needs to be treated with Mohs Surgery.  Diagnosis 1. Skin , mid tip of nose BASAL CELL CARCINOMA, NODULAR PATTERN 2. Skin , left nasal crease BASAL CELL CARCINOMA, NODULAR PATTERN  We will refer to Dr. Jeannine Boga at [facility]  [facility] [address] [place], [address] [place], [place] [address]  Phone: [phone] Fax: [phone]

## 2023-05-08 NOTE — Telephone Encounter (Signed)
I left a message asking her to call back.

## 2023-05-08 NOTE — Progress Notes (Signed)
Hi Michele,  Please call patient and notify that results of biopsy were positive for a skin cancer that needs to be treated with Mohs Surgery.  Diagnosis 1. Skin , mid tip of nose BASAL CELL CARCINOMA, NODULAR PATTERN 2. Skin , left nasal crease BASAL CELL CARCINOMA, NODULAR PATTERN  We will refer to Dr. Jeannine Boga at [facility]  [facility] [address] [place], [address] [place], [place] [address]  Phone: [phone] Fax: [phone]

## 2023-05-13 ENCOUNTER — Telehealth: Payer: Self-pay

## 2023-05-13 ENCOUNTER — Other Ambulatory Visit: Payer: Self-pay

## 2023-05-13 DIAGNOSIS — C44311 Basal cell carcinoma of skin of nose: Secondary | ICD-10-CM

## 2023-05-13 NOTE — Telephone Encounter (Signed)
Advised patient of results and sent referral to [facility]/hd

## 2023-05-13 NOTE — Telephone Encounter (Signed)
I left a message asking her to back.

## 2023-05-13 NOTE — Telephone Encounter (Signed)
Patient asked if she could wait a year to have surgery. I advised her that we would not recommend she wait. Advised her although BCC does not typically metastasize, it will continue to destroy tissue where it is growing. She understands that she needs surgery and will wait for a call from [facility] to schedule an appointment/hd

## 2023-07-01 ENCOUNTER — Telehealth: Payer: Self-pay | Admitting: Radiation Oncology

## 2023-07-01 NOTE — Telephone Encounter (Signed)
8/12 @ 4:17 pm patient would like to call back to schedule for consult after talk to her husband due to his work schedule.  He provides her with transportation.

## 2023-07-04 ENCOUNTER — Telehealth: Payer: Self-pay | Admitting: Radiation Oncology

## 2023-07-04 NOTE — Telephone Encounter (Signed)
8/15 @ 9:21 am Called/spoke to patient.  She would like to decline consult at this time, due to she feels her skin looks like it is healing and do not want any treatments right now.  She is aware, when she is ready to reach out to Dr. Daphine Deutscher, to put referral back in.  Email sent to [facility] so they are aware.  Closing referral at this time.

## 2023-08-01 ENCOUNTER — Telehealth: Payer: Self-pay

## 2023-08-01 NOTE — Telephone Encounter (Signed)
Spoke with patient regarding biopsy proven BCCs. She has a consultation appointment with Dr Daphine Deutscher at [facility] and Dr Daphine Deutscher discussed all her treatment options. Patient states that she healed so well after the biopsies that she decided that she did not want to be cut anymore. She has a follow up appointment with Dr Onalee Hua 08/06/2023 and will discuss it further with her at that appointment/hd

## 2023-08-05 NOTE — Telephone Encounter (Signed)
Ok. Thanks for the update

## 2023-08-06 ENCOUNTER — Ambulatory Visit: Payer: Medicare Other | Admitting: Dermatology

## 2023-08-28 ENCOUNTER — Encounter: Payer: Self-pay | Admitting: Dermatology

## 2023-08-28 ENCOUNTER — Ambulatory Visit (INDEPENDENT_AMBULATORY_CARE_PROVIDER_SITE_OTHER): Payer: Medicare Other | Admitting: Dermatology

## 2023-08-28 VITALS — BP 117/78 | HR 91

## 2023-08-28 DIAGNOSIS — C44311 Basal cell carcinoma of skin of nose: Secondary | ICD-10-CM

## 2023-08-28 DIAGNOSIS — L738 Other specified follicular disorders: Secondary | ICD-10-CM | POA: Diagnosis not present

## 2023-08-28 MED ORDER — KETOCONAZOLE 2 % EX CREA
1.0000 | TOPICAL_CREAM | Freq: Two times a day (BID) | CUTANEOUS | 5 refills | Status: AC
Start: 2023-08-28 — End: 2024-02-24

## 2023-08-28 NOTE — Progress Notes (Unsigned)
   Follow-Up Visit   Subjective  Deborah Cooley is a 67 y.o. female who presents for the following: Pityrosporum Folliculitis   Patient present today for follow up visit for Folliculitis. Patient was last evaluated on 05/02/23. Patient reports sxs were better. Patient denies medication changes.  The following portions of the chart were reviewed this encounter and updated as appropriate: medications, allergies, medical history  Review of Systems:  No other skin or systemic complaints except as noted in HPI or Assessment and Plan.  Objective  Well appearing patient in no apparent distress; mood and affect are within normal limits.  A focused examination was performed of the following areas: Chest  Relevant exam findings are noted in the Assessment and Plan.    Assessment & Plan   1. Basal Cell Carcinoma on the Tip of the Nose - Assessment: Patient was informed about the potential regrowth of cancer cells and the need for surgery. Pt is refusing surgery . Advised to closely monitor the area for any signs of growth. - Plan: Monitor closely for signs of growth. Consider surgical intervention as necessary.  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 - Plan:   a. Continue using the remaining nystatin triamcinolone for two weeks.   b. Prescribe ketoconazole cream with 4 refills. Instruct patient to apply twice a day for one month, then once a day thereafter.   c. Recommend using CeraVe benzoyl peroxide wash in the shower daily, focusing on the chest area. Avoid mucosal areas and use a white wash rag and towel to prevent staining.   d. Schedule a follow-up appointment in 6 months.   Pityrosporum folliculitis  Related Medications ketoconazole (NIZORAL) 2 % cream Apply 1 Application topically 2 (two) times daily. For one Month apply 2 times daily, After 1  month apply once a day    No follow-ups on file.    Documentation: I have reviewed the above documentation for accuracy and completeness, and I agree with the above.    Langston Reusing, DO

## 2023-08-28 NOTE — Patient Instructions (Signed)
Hello Deborah Cooley,  Thank you for visiting [place] today. We appreciate your commitment to improving your health and effectively managing your skin condition.  Here is a summary of the key instructions from today's consultation:  - Medications Prescribed:   - Ketoconazole Cream: Apply twice daily for one month, then reduce to once daily. Four refills provided.   - CeraVe Benzoyl Peroxide Wash: Use daily in the shower for your chest, avoiding mucosal areas.   - Nystatin Triamcinolone: Use the remaining amount for about two weeks before switching to Ketoconazole.  - Lifestyle Advice:   - Use a white washcloth and towel to avoid staining from the benzoyl peroxide wash.   - Monitor the basal cell carcinoma on your nose and consider surgical options if it regrows.  - Follow-Up:   - No immediate follow-up required. Next appointment in 6 months unless issues arise.  Please start using the Nystatin Triamcinolone you have left until you receive the Ketoconazole, to ensure continuous treatment. We have provided samples of the CeraVe wash to get you started, and it is available for purchase at [facility].  Thank you again for your visit today, and please do not hesitate to contact our office if you have any questions or concerns.  Warm regards,  Dr. [facility]   Important Information  Due to recent changes in healthcare laws, you may see results of your pathology and/or laboratory studies on MyChart before the doctors have had a chance to review them. We understand that in some cases there may be results that are confusing or concerning to you. Please understand that not all results are received at the same time and often the doctors may need to interpret multiple results in order to provide you with the best plan of care or course of treatment. Therefore, we ask that you please give [place] 2 business days to thoroughly review all your results before contacting the office for clarification. Should  we see a critical lab result, you will be contacted sooner.   If You Need Anything After Your Visit  If you have any questions or concerns for your doctor, please call our main line at [phone] If no one answers, please leave a voicemail as directed and we will return your call as soon as possible. Messages left after 4 pm will be answered the following business day.   You may also send [place] a message via MyChart. We typically respond to MyChart messages within 1-2 business days.  For prescription refills, please ask your pharmacy to contact our office. Our fax number is [phone].  If you have an urgent issue when the clinic is closed that cannot wait until the next business day, you can page your doctor at the number below.    Please note that while we do our best to be available for urgent issues outside of office hours, we are not available 24/7.   If you have an urgent issue and are unable to reach [place], you may choose to seek medical care at your doctor's office, retail clinic, urgent care center, or emergency room.  If you have a medical emergency, please immediately call 911 or go to the emergency department. In the event of inclement weather, please call our main line at [phone] for an update on the status of any delays or closures.  Dermatology Medication Tips: Please keep the boxes that topical medications come in in order to help keep track of the instructions about where and how to use these.  Pharmacies typically print the medication instructions only on the boxes and not directly on the medication tubes.   If your medication is too expensive, please contact our office at [phone] or send [place] a message through MyChart.   We are unable to tell what your co-pay for medications will be in advance as this is different depending on your insurance coverage. However, we may be able to find a substitute medication at lower cost or fill out paperwork to get insurance to cover  a needed medication.   If a prior authorization is required to get your medication covered by your insurance company, please allow [place] 1-2 business days to complete this process.  Drug prices often vary depending on where the prescription is filled and some pharmacies may offer cheaper prices.  The website www.goodrx.com contains coupons for medications through different pharmacies. The prices here do not account for what the cost may be with help from insurance (it may be cheaper with your insurance), but the website can give you the price if you did not use any insurance.  - You can print the associated coupon and take it with your prescription to the pharmacy.  - You may also stop by our office during regular business hours and pick up a GoodRx coupon card.  - If you need your prescription sent electronically to a different pharmacy, notify our office through [facility] or by phone at [phone]

## 2024-01-29 ENCOUNTER — Ambulatory Visit: Payer: Self-pay

## 2024-02-11 ENCOUNTER — Other Ambulatory Visit: Payer: Self-pay

## 2024-02-11 DIAGNOSIS — C8440 Peripheral T-cell lymphoma, not classified, unspecified site: Secondary | ICD-10-CM

## 2024-02-11 NOTE — Progress Notes (Unsigned)
 HEMATOLOGY/ONCOLOGY CLINIC NOTE  Date of Service: 02/12/2024  Patient Care Team: Default, Provider, MD as PCP - General Isac Sarna, MD (Unknown Physician Specialty)  CHIEF COMPLAINTS/PURPOSE OF CONSULTATION:  Follow-up for continued evaluation and management of angioimmunoblastic T-cell lymphoma  HISTORY OF PRESENTING ILLNESS:   Deborah Cooley is a wonderful 68 y.o. female who has been referred to [place] by Dr Carlynn Purl for evaluation and management of her newly diagnosed angioimmunoblastic T cell lymphoma.   Patient has a h/o HTN, endometriosis, tobacco use who was admitted to the hospital with shortness of breath and was subsequently found to have diffuse lymphadenopathy. She was also found to have bilateral pleural effusions and has had a thoracentesis with pleural fluid demonstrating atypical lymphocytosis. She was discharged but returned to the ED with complaints of persisting SOB, abdominal swelling, and lower extremity swelling.   She has had a  CTA chest and CT abd/pelvis which showed no PE.but extensive LNadenopathy in the chest and throughout the abd with b/l pleural effusion and mild ascites. Also noted to have narrowing in b/l common iliac arteries.  Clinically she is noted to have b/l LE edema 1-2+  Most recent lab results (05/05/18) of CBC  is as follows: all values are WNL except for RBC at 2.59, HGB at 7.7, HCT at 22.7, RDW at 16.0, PLT at 89k.  She has had a CT bone marrow biopsy on 05/05/2018 -results pending. ECHO was done and showed  Systolic function was   normal. The estimated ejection fraction was in the range of 60%   to 65%. Wall motion was normal; there were no regional wall   motion abnormalities. Left ventricular diastolic function   parameters were normal.  Port a cath placement requested.  Patient was transferred to [facility] with her consent to receive C1 of chemotherapy as inpatient.  INTERVAL HISTORY:   Deborah Cooley returns  today for continued evaluation and management of angioimmunoblastic T-cell lymphoma. Patient was last seen by me on 02/06/2023 and complained of intermittent left lower back pain from spinal tap procedure. She also reported worsened skin rash on her nose, and stable chronic hot flashes.   Patient saw her dermatologist, Dr. Onalee Hua, on 08/28/2023 and was found to have basal cell carcinoma on the nose. Patient refused surgery and wanted to monitor the area. 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 She reports that she was referred to [facility].   Patient reports that her chest rash is improving, and she admits that she is needing to use her steroid antifungal cream more often. 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 She has otherwise been feeling well overall.   Patient reports that she has been doing well overall over the last year. Patient has been eating and sleeping well. She denies any new lumps/bumps, breathing issues, fever, chills, night sweats, abdominal pain, sudden weight loss, pain along the spine, pain over the liver, or leg swelling.   She reports endorsing hot and cold flashes, mostly hot. These symptoms have been stable for years.   MEDICAL HISTORY:  Past Medical History:  Diagnosis Date   angioimmunoblastic lymphoma dx'd 03/2018   BCC (basal cell carcinoma of skin) 05/02/2023   left nasal crease - schedule for Mohs   BCC (basal cell carcinoma), face 05/02/2023   mid tip of nose - schedule for Mohs   Bilateral pleural effusion 04/17/2018   Endometriosis    Essential  hypertension, benign    Family history of adverse reaction to anesthesia    "sister stops breathing I think" (04/18/2018)   Heart murmur    Mixed hyperlipidemia    Pneumonia 04/17/2018    SURGICAL HISTORY: Past Surgical History:  Procedure Laterality Date   ABDOMINAL HYSTERECTOMY  1970s/1980s   for endometriosis    Arthroscopic right shoulder surgery     AXILLARY LYMPH NODE BIOPSY Left 04/21/2018   Procedure: LEFT AXILLARY LYMPH NODE BIOPSY;  Surgeon: [name], De Blanch, MD;  Location: [facility] OR;  Service: General;  Laterality: Left;   CARPAL TUNNEL RELEASE Right    GANGLION CYST EXCISION Right    IR IMAGING GUIDED PORT INSERTION  05/07/2018   IR REMOVAL TUN ACCESS W/ PORT W/O FL MOD SED  12/23/2018   MULTIPLE TOOTH EXTRACTIONS     OOPHORECTOMY  1997   PARTIAL HYSTERECTOMY  1984   Right hand surgery     SHOULDER OPEN ROTATOR CUFF REPAIR Right    VESICOVAGINAL FISTULA CLOSURE W/ TAH      SOCIAL HISTORY: Social History   Socioeconomic History   Marital status: Married    Spouse name: Not on file   Number of children: Not on file   Years of education: Not on file   Highest education level: Not on file  Occupational History   Occupation: Designer, industrial/product    Comment: works at [employer]  Tobacco Use   Smoking status: Every Day    Current packs/day: 1.50    Average packs/day: 1.5 packs/day for 46.0 years (69.0 ttl pk-yrs)    Types: Cigarettes    Passive exposure: Current   Smokeless tobacco: Never  Vaping Use   Vaping status: Never Used  Substance and Sexual Activity   Alcohol use: Not Currently    Comment: 04/18/2018 "quit years ago"   Drug use: Not Currently    Comment: "back in my 76s"   Sexual activity: Not Currently  Other Topics Concern   Not on file  Social History Narrative   ** Merged History Encounter **       Patient has one son whom is healthy.   Has a live in boyfriend.   Social Drivers of Corporate investment banker Strain: Not on file  Food Insecurity: Not on file  Transportation Needs: Not on file  Physical Activity: Not on file  Stress: Not on file  Social Connections: Not on file  Intimate Partner Violence: Not on file    FAMILY HISTORY: Family History  Problem Relation Age of Onset   Diabetes Mother        age 43   Heart attack Father        died  age 53's   Hypertension Father    Cancer Sister        breast cancer diagonsed age 60 years   Diabetes Other    Heart disease Other        Female <55   Arthritis Other    Asthma Other    Hyperlipidemia Other        several siblings   Thyroid disease Sister        one sister with thyroid disease    ALLERGIES:  is allergic to [place] [zolpidem].  MEDICATIONS:  Current Outpatient Medications  Medication Sig Dispense Refill   Adapalene (DIFFERIN) 0.3 % gel Apply 1 Application topically at bedtime. 45 g 3   ketoconazole (NIZORAL) 2 % cream Apply 1 Application topically 2 (two) times daily.  For one Month apply 2 times daily, After 1 month apply once a day 60 g 5   Multiple Vitamin (MULTIVITAMIN WITH MINERALS) TABS tablet Take 1 tablet by mouth daily. 30 tablet 0   nystatin-triamcinolone ointment (MYCOLOG) Apply 1 Application topically 2 (two) times daily. 60 g 3   triamcinolone ointment (KENALOG) 0.5 % Apply 1 Application topically 2 (two) times daily. To rash over the forearms, legs and chest wall. Do not use on face. 30 g 1   No current facility-administered medications for this visit.    REVIEW OF SYSTEMS:   10 Point review of Systems was done is negative except as noted above.    PHYSICAL EXAMINATION: ECOG PERFORMANCE STATUS: 2 - Symptomatic, <50% confined to bed  .BP (!) 140/77 (BP Location: Right Arm, Patient Position: Sitting)   Pulse 91   Temp 97.7 F (36.5 C) (Tympanic)   Resp 18   Ht 5\' 1"  (1.549 m)   Wt 126 lb 6.4 oz (57.3 kg)   SpO2 97%   BMI 23.88 kg/m   GENERAL:alert, in no acute distress and comfortable SKIN: no acute rashes, no significant lesions EYES: conjunctiva are pink and non-injected, sclera anicteric OROPHARYNX: MMM, no exudates, no oropharyngeal erythema or ulceration NECK: supple, no JVD LYMPH:  no palpable lymphadenopathy in the cervical, axillary or inguinal regions LUNGS: clear to auscultation b/l with normal respiratory effort HEART: regular  rate & rhythm ABDOMEN:  normoactive bowel sounds , non tender, not distended. Extremity: no pedal edema PSYCH: alert & oriented x 3 with fluent speech NEURO: no focal motor/sensory deficits  LABORATORY DATA:  I have reviewed the data as listed     Latest Ref Rng & Units 02/12/2024    9:07 AM 02/06/2023    9:33 AM 08/08/2022   10:23 AM  CBC  WBC 4.0 - 10.5 K/uL 8.5  7.0  8.7   Hemoglobin 12.0 - 15.0 g/dL 40.9  81.1  91.4   Hematocrit 36.0 - 46.0 % 38.4  40.0  42.3   Platelets 150 - 400 K/uL 172  149  169   ANC 700  CBC    Component Value Date/Time   WBC 8.5 02/12/2024 0907   WBC 8.3 08/09/2021 1339   RBC 4.51 02/12/2024 0907   HGB 12.6 02/12/2024 0907   HCT 38.4 02/12/2024 0907   PLT 172 02/12/2024 0907   MCV 85.1 02/12/2024 0907   MCH 27.9 02/12/2024 0907   MCHC 32.8 02/12/2024 0907   RDW 13.8 02/12/2024 0907   LYMPHSABS 2.0 02/12/2024 0907   MONOABS 0.7 02/12/2024 0907   EOSABS 0.1 02/12/2024 0907   BASOSABS 0.0 02/12/2024 0907       Latest Ref Rng & Units 02/12/2024    9:07 AM 02/06/2023    9:33 AM 08/08/2022   10:23 AM  CMP  Glucose 70 - 99 mg/dL 782  956  213   BUN 8 - 23 mg/dL 18  9  11    Creatinine 0.44 - 1.00 mg/dL 0.86  5.78  4.69   Sodium 135 - 145 mmol/L 135  138  137   Potassium 3.5 - 5.1 mmol/L 4.3  4.4  4.3   Chloride 98 - 111 mmol/L 102  103  103   CO2 22 - 32 mmol/L 27  29  28    Calcium 8.9 - 10.3 mg/dL 9.6  62.9  9.7   Total Protein 6.5 - 8.1 g/dL 7.2  7.1  7.2   Total Bilirubin 0.0 - 1.2 mg/dL  0.5  0.4  0.5   Alkaline Phos 38 - 126 U/L 57  56  53   AST 15 - 41 U/L 12  13  13    ALT 0 - 44 U/L 9  13  13      Lab Results  Component Value Date   LDH 134 02/12/2024      Component     Latest Ref Rng & Units 04/18/2018 05/03/2018  Hepatitis B Surface Ag     Negative  Negative  HCV Ab     0.0 - 0.9 s/co ratio  <0.1  Hep A Ab, IgM     Negative  Negative  Hep B Core Ab, IgM     Negative  Negative  HIV Screen 4th Generation wRfx     Non  Reactive Non Reactive     04/21/18 Molecular Pathology:   04/21/18 Surgical Pathology:    RADIOGRAPHIC STUDIES: I have personally reviewed the radiological images as listed and agreed with the findings in the report. No results found.  ASSESSMENT & PLAN:   68 y.o. female with   1. 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 S/P C1 OF CHOP on 05/08/2018 S/p C2 of Brentuximab-CHP on 05/28/2018  S/p C3 of Brentuximab-CHP on 06/18/2018 S/p C4 of Brentuximab-CHP on  07/09/2018 S/p C5 of Brentuximab-CHP on  07/30/2018  07/01/18 PET/CT revealed Resolution of axillary, mediastinal, periaortic, and inguinal lymphadenopathy. ( [place] 1) 2. Decrease in size of spleen.  Normal metabolic activity. 3. Uniform increase in marrow metabolic activity is most consistent with GCSF type response.   09/24/18 PET/CT revealed No evidence of residual or recurrent hypermetabolic lymphoma. ([place]ville 1). 2. 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 Recommend assessment of coronary risk factors and consideration of medical therapy. 3. Aortic atherosclerosis and emphysema.  Discussed the option to pursue BM transplant after completing treatment and that I would be happy to refer the pt to [facility] for a discussion with a transplant team - this was done but not possible due to lack of insurance. Then referred pt to [facility], and this also was not possible due to lack of insurance.  Pt is currently working with a Child psychotherapist to [employer].  08/13/2019 CT C/A/P ([id number]) revealed "1. No recurrent adenopathy to suggest active malignancy. 2. Subtle ground-glass nodularity medially at the left lung apex, likely inflammatory. 3. Other imaging findings of potential clinical significance: Aortic Atherosclerosis (ICD10-I70.0). Coronary atherosclerosis. Emphysema (ICD10-J43.9). Airway thickening is present, suggesting bronchitis or reactive  airways disease. Stable calcified right thyroid nodule. 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 General LNadenopathy from lymphoma with b/l pleural effusion and b/l lower extremity weakness- resolved   3. Anemia/thrombocytopenia -  from Bone marrow involvement by lymphoma + Chemotherapy BM Bx confirms involved by T cell lymphoma. Anemia now resolved. Mild stable thrombocytopenia  4. 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 LDH wnl no evidnece of overt hemolysis at this timne    5. 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 CBC normal, showed WBC of 8.5K, hemoglobin of 12.6, and platelets of 172K. -CMP shows blood glucose level is mildly high at 227 mg/dL, CMP otherwise  stable LDH WNL at 134 -did not feel any enlarged lymph nodes during physical examination -Patient has no lab or clinical evidence of lymphoma recurrence/progression at this time. -Patient has no new focal symptoms suggestive of lymphoma recurrence. -recommend establishing care with a PCP to manage her slightly elevated blood glucose levels. Also discussed option of referral to a temporary PCP or being seen by nurse practitioner. Discussed that there may be concern for pre-diabetes or early diabetes. -Patient has no PCP at this time and patient reports some hesitancy towards establishing care with a PCP. She is refusing referall to PCP at this time and reports that she may consider researching for a PCP she may potentially connect with through friends and family -advised patient to let us know if would like a referral to a PCP -discussed that she would generally be discharged to a PCP at this point from a lymphoma standpoint after 5-7 years.  However, she shall continue to follow with [place] given that she has not established care with a PCP. Discussed that she would need to connect with a PCP for management of her blood sugars.  -patient shall return to clinic in 1 year  FOLLOW-UP: RTC with Dr Candise Che with labs in 12 months  The total time spent in the appointment was 20 minutes* .  All of the patient's questions were answered with apparent satisfaction. The patient knows to call the clinic with any problems, questions or concerns.   Wyvonnia Lora MD MS AAHIVMS [facility] Hematology/Oncology Physician [facility]  .*Total Encounter Time as defined by the Centers for Medicare and Medicaid Services includes, in addition to the face-to-face time of a patient visit (documented in the note above) non-face-to-face time: obtaining and reviewing outside history, ordering and reviewing medications, tests or procedures, care coordination (communications with other health care professionals or caregivers) and documentation in the medical record.    I,Mitra Faeizi,acting as a Neurosurgeon for Wyvonnia Lora, MD.,have documented all relevant documentation on the behalf of Wyvonnia Lora, MD,as directed by  Wyvonnia Lora, MD while in the presence of Wyvonnia Lora, MD.  .I have reviewed the above documentation for accuracy and completeness, and I agree with the above. Johney Maine MD

## 2024-02-12 ENCOUNTER — Inpatient Hospital Stay (HOSPITAL_BASED_OUTPATIENT_CLINIC_OR_DEPARTMENT_OTHER): Payer: No Typology Code available for payment source | Admitting: Hematology

## 2024-02-12 ENCOUNTER — Inpatient Hospital Stay: Payer: No Typology Code available for payment source | Attending: Hematology

## 2024-02-12 VITALS — BP 140/77 | HR 91 | Temp 97.7°F | Resp 18 | Ht 61.0 in | Wt 126.4 lb

## 2024-02-12 DIAGNOSIS — C865 Angioimmunoblastic T-cell lymphoma not having achieved remission: Secondary | ICD-10-CM | POA: Diagnosis present

## 2024-02-12 DIAGNOSIS — D696 Thrombocytopenia, unspecified: Secondary | ICD-10-CM | POA: Insufficient documentation

## 2024-02-12 DIAGNOSIS — R21 Rash and other nonspecific skin eruption: Secondary | ICD-10-CM | POA: Diagnosis not present

## 2024-02-12 DIAGNOSIS — R232 Flushing: Secondary | ICD-10-CM | POA: Diagnosis not present

## 2024-02-12 DIAGNOSIS — C8440 Peripheral T-cell lymphoma, not classified, unspecified site: Secondary | ICD-10-CM

## 2024-02-12 DIAGNOSIS — F17210 Nicotine dependence, cigarettes, uncomplicated: Secondary | ICD-10-CM | POA: Insufficient documentation

## 2024-02-12 LAB — CBC WITH DIFFERENTIAL (CANCER CENTER ONLY)
Abs Immature Granulocytes: 0.04 10*3/uL (ref 0.00–0.07)
Basophils Absolute: 0 10*3/uL (ref 0.0–0.1)
Basophils Relative: 1 %
Eosinophils Absolute: 0.1 10*3/uL (ref 0.0–0.5)
Eosinophils Relative: 1 %
HCT: 38.4 % (ref 36.0–46.0)
Hemoglobin: 12.6 g/dL (ref 12.0–15.0)
Immature Granulocytes: 1 %
Lymphocytes Relative: 24 %
Lymphs Abs: 2 10*3/uL (ref 0.7–4.0)
MCH: 27.9 pg (ref 26.0–34.0)
MCHC: 32.8 g/dL (ref 30.0–36.0)
MCV: 85.1 fL (ref 80.0–100.0)
Monocytes Absolute: 0.7 10*3/uL (ref 0.1–1.0)
Monocytes Relative: 9 %
Neutro Abs: 5.6 10*3/uL (ref 1.7–7.7)
Neutrophils Relative %: 64 %
Platelet Count: 172 10*3/uL (ref 150–400)
RBC: 4.51 MIL/uL (ref 3.87–5.11)
RDW: 13.8 % (ref 11.5–15.5)
WBC Count: 8.5 10*3/uL (ref 4.0–10.5)
nRBC: 0 % (ref 0.0–0.2)

## 2024-02-12 LAB — CMP (CANCER CENTER ONLY)
ALT: 9 U/L (ref 0–44)
AST: 12 U/L — ABNORMAL LOW (ref 15–41)
Albumin: 4.4 g/dL (ref 3.5–5.0)
Alkaline Phosphatase: 57 U/L (ref 38–126)
Anion gap: 6 (ref 5–15)
BUN: 18 mg/dL (ref 8–23)
CO2: 27 mmol/L (ref 22–32)
Calcium: 9.6 mg/dL (ref 8.9–10.3)
Chloride: 102 mmol/L (ref 98–111)
Creatinine: 1.03 mg/dL — ABNORMAL HIGH (ref 0.44–1.00)
GFR, Estimated: 59 mL/min — ABNORMAL LOW (ref >60)
Glucose, Bld: 227 mg/dL — ABNORMAL HIGH (ref 70–99)
Potassium: 4.3 mmol/L (ref 3.5–5.1)
Sodium: 135 mmol/L (ref 135–145)
Total Bilirubin: 0.5 mg/dL (ref 0.0–1.2)
Total Protein: 7.2 g/dL (ref 6.5–8.1)

## 2024-02-12 LAB — LACTATE DEHYDROGENASE: LDH: 134 U/L (ref 98–192)

## 2024-02-14 ENCOUNTER — Telehealth: Payer: Self-pay | Admitting: Hematology

## 2024-02-14 NOTE — Telephone Encounter (Signed)
 Spoke with patient confirming upcoming appointment

## 2024-02-19 ENCOUNTER — Encounter: Payer: Self-pay | Admitting: Hematology

## 2024-02-26 ENCOUNTER — Ambulatory Visit: Payer: Medicare Other | Admitting: Dermatology

## 2024-03-20 ENCOUNTER — Other Ambulatory Visit: Payer: Self-pay

## 2024-03-20 ENCOUNTER — Inpatient Hospital Stay (HOSPITAL_COMMUNITY)
Admission: EM | Admit: 2024-03-20 | Discharge: 2024-03-23 | DRG: 291 | Disposition: A | Discharge status: Home / Self Care | Attending: Internal Medicine | Admitting: Internal Medicine

## 2024-03-20 ENCOUNTER — Observation Stay (HOSPITAL_COMMUNITY)

## 2024-03-20 ENCOUNTER — Encounter (HOSPITAL_COMMUNITY): Payer: Self-pay | Admitting: Emergency Medicine

## 2024-03-20 ENCOUNTER — Emergency Department (HOSPITAL_COMMUNITY)

## 2024-03-20 DIAGNOSIS — R0602 Shortness of breath: Secondary | ICD-10-CM | POA: Diagnosis present

## 2024-03-20 DIAGNOSIS — Z79899 Other long term (current) drug therapy: Secondary | ICD-10-CM

## 2024-03-20 DIAGNOSIS — I110 Hypertensive heart disease with heart failure: Principal | ICD-10-CM | POA: Diagnosis present

## 2024-03-20 DIAGNOSIS — E782 Mixed hyperlipidemia: Secondary | ICD-10-CM | POA: Diagnosis present

## 2024-03-20 DIAGNOSIS — Z85828 Personal history of other malignant neoplasm of skin: Secondary | ICD-10-CM

## 2024-03-20 DIAGNOSIS — R0603 Acute respiratory distress: Secondary | ICD-10-CM | POA: Diagnosis present

## 2024-03-20 DIAGNOSIS — R7989 Other specified abnormal findings of blood chemistry: Secondary | ICD-10-CM | POA: Diagnosis present

## 2024-03-20 DIAGNOSIS — Z716 Tobacco abuse counseling: Secondary | ICD-10-CM

## 2024-03-20 DIAGNOSIS — R079 Chest pain, unspecified: Secondary | ICD-10-CM | POA: Diagnosis present

## 2024-03-20 DIAGNOSIS — C8440 Peripheral T-cell lymphoma, not classified, unspecified site: Secondary | ICD-10-CM | POA: Diagnosis present

## 2024-03-20 DIAGNOSIS — Z888 Allergy status to other drugs, medicaments and biological substances status: Secondary | ICD-10-CM

## 2024-03-20 DIAGNOSIS — I509 Heart failure, unspecified: Principal | ICD-10-CM

## 2024-03-20 DIAGNOSIS — D630 Anemia in neoplastic disease: Secondary | ICD-10-CM | POA: Diagnosis present

## 2024-03-20 DIAGNOSIS — I10 Essential (primary) hypertension: Secondary | ICD-10-CM | POA: Diagnosis present

## 2024-03-20 DIAGNOSIS — E876 Hypokalemia: Secondary | ICD-10-CM | POA: Diagnosis present

## 2024-03-20 DIAGNOSIS — Z825 Family history of asthma and other chronic lower respiratory diseases: Secondary | ICD-10-CM

## 2024-03-20 DIAGNOSIS — I5023 Acute on chronic systolic (congestive) heart failure: Secondary | ICD-10-CM | POA: Diagnosis present

## 2024-03-20 DIAGNOSIS — F17210 Nicotine dependence, cigarettes, uncomplicated: Secondary | ICD-10-CM | POA: Diagnosis present

## 2024-03-20 DIAGNOSIS — Z720 Tobacco use: Secondary | ICD-10-CM | POA: Diagnosis present

## 2024-03-20 DIAGNOSIS — Z8249 Family history of ischemic heart disease and other diseases of the circulatory system: Secondary | ICD-10-CM

## 2024-03-20 DIAGNOSIS — C859A Non-Hodgkin lymphoma, unspecified, in remission: Secondary | ICD-10-CM | POA: Diagnosis present

## 2024-03-20 DIAGNOSIS — Z1152 Encounter for screening for COVID-19: Secondary | ICD-10-CM

## 2024-03-20 LAB — COMPREHENSIVE METABOLIC PANEL WITH GFR
ALT: 21 U/L (ref 0–44)
AST: 17 U/L (ref 15–41)
Albumin: 4.1 g/dL (ref 3.5–5.0)
Alkaline Phosphatase: 46 U/L (ref 38–126)
Anion gap: 10 (ref 5–15)
BUN: 14 mg/dL (ref 8–23)
CO2: 20 mmol/L — ABNORMAL LOW (ref 22–32)
Calcium: 9.5 mg/dL (ref 8.9–10.3)
Chloride: 102 mmol/L (ref 98–111)
Creatinine, Ser: 0.89 mg/dL (ref 0.44–1.00)
GFR, Estimated: >60 mL/min (ref >60)
Glucose, Bld: 160 mg/dL — ABNORMAL HIGH (ref 70–99)
Potassium: 3.9 mmol/L (ref 3.5–5.1)
Sodium: 132 mmol/L — ABNORMAL LOW (ref 135–145)
Total Bilirubin: 1 mg/dL (ref 0.0–1.2)
Total Protein: 6.7 g/dL (ref 6.5–8.1)

## 2024-03-20 LAB — LIPID PANEL
Cholesterol: 204 mg/dL — ABNORMAL HIGH (ref 0–200)
HDL: 42 mg/dL (ref >40)
LDL Cholesterol: 141 mg/dL — ABNORMAL HIGH (ref 0–99)
Total CHOL/HDL Ratio: 4.9 ratio
Triglycerides: 103 mg/dL (ref <150)
VLDL: 21 mg/dL (ref 0–40)

## 2024-03-20 LAB — HIV ANTIBODY (ROUTINE TESTING W REFLEX): HIV Screen 4th Generation wRfx: NONREACTIVE

## 2024-03-20 LAB — RESP PANEL BY RT-PCR (RSV, FLU A&B, COVID)  RVPGX2
Influenza A by PCR: NEGATIVE
Influenza B by PCR: NEGATIVE
Resp Syncytial Virus by PCR: NEGATIVE
SARS Coronavirus 2 by RT PCR: NEGATIVE

## 2024-03-20 LAB — CBC WITH DIFFERENTIAL/PLATELET
Abs Immature Granulocytes: 0.03 10*3/uL (ref 0.00–0.07)
Basophils Absolute: 0 10*3/uL (ref 0.0–0.1)
Basophils Relative: 0 %
Eosinophils Absolute: 0.1 10*3/uL (ref 0.0–0.5)
Eosinophils Relative: 1 %
HCT: 36 % (ref 36.0–46.0)
Hemoglobin: 11.6 g/dL — ABNORMAL LOW (ref 12.0–15.0)
Immature Granulocytes: 0 %
Lymphocytes Relative: 26 %
Lymphs Abs: 1.9 10*3/uL (ref 0.7–4.0)
MCH: 27.8 pg (ref 26.0–34.0)
MCHC: 32.2 g/dL (ref 30.0–36.0)
MCV: 86.1 fL (ref 80.0–100.0)
Monocytes Absolute: 0.7 10*3/uL (ref 0.1–1.0)
Monocytes Relative: 9 %
Neutro Abs: 4.7 10*3/uL (ref 1.7–7.7)
Neutrophils Relative %: 64 %
Platelets: 166 10*3/uL (ref 150–400)
RBC: 4.18 MIL/uL (ref 3.87–5.11)
RDW: 15.1 % (ref 11.5–15.5)
WBC: 7.4 10*3/uL (ref 4.0–10.5)
nRBC: 0 % (ref 0.0–0.2)

## 2024-03-20 LAB — PHOSPHORUS: Phosphorus: 3.5 mg/dL (ref 2.5–4.6)

## 2024-03-20 LAB — HEMOGLOBIN A1C
Hgb A1c MFr Bld: 6.3 % — ABNORMAL HIGH (ref 4.8–5.6)
Mean Plasma Glucose: 134.11 mg/dL

## 2024-03-20 LAB — D-DIMER, QUANTITATIVE: D-Dimer, Quant: 0.56 ug{FEU}/mL — ABNORMAL HIGH (ref 0.00–0.50)

## 2024-03-20 LAB — BRAIN NATRIURETIC PEPTIDE: B Natriuretic Peptide: 1311 pg/mL — ABNORMAL HIGH (ref 0.0–100.0)

## 2024-03-20 LAB — TROPONIN I (HIGH SENSITIVITY)
Troponin I (High Sensitivity): 19 ng/L — ABNORMAL HIGH (ref <18)
Troponin I (High Sensitivity): 17 ng/L (ref <18)

## 2024-03-20 LAB — MAGNESIUM: Magnesium: 2 mg/dL (ref 1.7–2.4)

## 2024-03-20 MED ORDER — LEVALBUTEROL HCL 0.63 MG/3ML IN NEBU
0.6300 mg | INHALATION_SOLUTION | Freq: Three times a day (TID) | RESPIRATORY_TRACT | Status: DC
  Administered 2024-03-20: 0.63 mg via RESPIRATORY_TRACT
  Filled 2024-03-20: qty 3

## 2024-03-20 MED ORDER — SODIUM CHLORIDE 0.9% FLUSH
3.0000 mL | Freq: Two times a day (BID) | INTRAVENOUS | Status: DC
  Administered 2024-03-20 – 2024-03-23 (×3): 3 mL via INTRAVENOUS

## 2024-03-20 MED ORDER — HEPARIN SODIUM (PORCINE) 5000 UNIT/ML IJ SOLN
5000.0000 [IU] | Freq: Three times a day (TID) | INTRAMUSCULAR | Status: DC
  Filled 2024-03-20 (×4): qty 1

## 2024-03-20 MED ORDER — IOHEXOL 350 MG/ML SOLN
75.0000 mL | Freq: Once | INTRAVENOUS | Status: AC | PRN
  Administered 2024-03-20: 75 mL via INTRAVENOUS

## 2024-03-20 MED ORDER — ONDANSETRON HCL 4 MG PO TABS: 4.0000 mg | ORAL_TABLET | Freq: Four times a day (QID) | ORAL | Status: DC | PRN

## 2024-03-20 MED ORDER — ALPRAZOLAM 0.5 MG PO TABS
0.5000 mg | ORAL_TABLET | Freq: Three times a day (TID) | ORAL | Status: DC | PRN
  Administered 2024-03-22: 0.5 mg via ORAL
  Filled 2024-03-20: qty 1

## 2024-03-20 MED ORDER — OXYCODONE HCL 5 MG PO TABS: 5.0000 mg | ORAL_TABLET | ORAL | Status: DC | PRN

## 2024-03-20 MED ORDER — LEVALBUTEROL HCL 0.63 MG/3ML IN NEBU: 0.6300 mg | INHALATION_SOLUTION | Freq: Four times a day (QID) | RESPIRATORY_TRACT | Status: DC | PRN

## 2024-03-20 MED ORDER — ACETAMINOPHEN 325 MG PO TABS: 650.0000 mg | ORAL_TABLET | Freq: Four times a day (QID) | ORAL | Status: DC | PRN

## 2024-03-20 MED ORDER — FUROSEMIDE 10 MG/ML IJ SOLN
40.0000 mg | Freq: Once | INTRAMUSCULAR | Status: AC
  Administered 2024-03-20: 40 mg via INTRAVENOUS
  Filled 2024-03-20: qty 4

## 2024-03-20 MED ORDER — HYDRALAZINE HCL 20 MG/ML IJ SOLN: 10.0000 mg | INTRAMUSCULAR | Status: DC | PRN

## 2024-03-20 MED ORDER — IPRATROPIUM BROMIDE 0.02 % IN SOLN
0.5000 mg | Freq: Three times a day (TID) | RESPIRATORY_TRACT | Status: DC
  Administered 2024-03-20 – 2024-03-21 (×4): 0.5 mg via RESPIRATORY_TRACT
  Filled 2024-03-20 (×4): qty 2.5

## 2024-03-20 MED ORDER — ALBUTEROL SULFATE (2.5 MG/3ML) 0.083% IN NEBU
5.0000 mg | INHALATION_SOLUTION | Freq: Once | RESPIRATORY_TRACT | Status: AC
  Administered 2024-03-20: 5 mg via RESPIRATORY_TRACT
  Filled 2024-03-20: qty 6

## 2024-03-20 MED ORDER — ACETAMINOPHEN 650 MG RE SUPP: 650.0000 mg | Freq: Four times a day (QID) | RECTAL | Status: DC | PRN

## 2024-03-20 MED ORDER — FUROSEMIDE 10 MG/ML IJ SOLN
40.0000 mg | Freq: Two times a day (BID) | INTRAMUSCULAR | Status: DC
  Administered 2024-03-20: 40 mg via INTRAVENOUS
  Filled 2024-03-20: qty 4

## 2024-03-20 MED ORDER — TRAZODONE HCL 50 MG PO TABS
25.0000 mg | ORAL_TABLET | Freq: Every evening | ORAL | Status: DC | PRN
  Administered 2024-03-21: 25 mg via ORAL
  Filled 2024-03-20: qty 1

## 2024-03-20 MED ORDER — IPRATROPIUM BROMIDE 0.02 % IN SOLN
0.5000 mg | Freq: Three times a day (TID) | RESPIRATORY_TRACT | Status: DC
  Administered 2024-03-20: 0.5 mg via RESPIRATORY_TRACT
  Filled 2024-03-20: qty 2.5

## 2024-03-20 MED ORDER — SENNOSIDES-DOCUSATE SODIUM 8.6-50 MG PO TABS: 1.0000 | ORAL_TABLET | Freq: Every evening | ORAL | Status: DC | PRN

## 2024-03-20 MED ORDER — FLEET ENEMA RE ENEM: 1.0000 | ENEMA | Freq: Once | RECTAL | Status: DC | PRN

## 2024-03-20 MED ORDER — SODIUM CHLORIDE 0.9% FLUSH
3.0000 mL | Freq: Two times a day (BID) | INTRAVENOUS | Status: DC
  Administered 2024-03-20 – 2024-03-23 (×5): 3 mL via INTRAVENOUS

## 2024-03-20 MED ORDER — IPRATROPIUM BROMIDE 0.02 % IN SOLN
0.5000 mg | Freq: Once | RESPIRATORY_TRACT | Status: AC
  Administered 2024-03-20: 0.5 mg via RESPIRATORY_TRACT
  Filled 2024-03-20: qty 2.5

## 2024-03-20 MED ORDER — LEVALBUTEROL HCL 0.63 MG/3ML IN NEBU
0.6300 mg | INHALATION_SOLUTION | Freq: Three times a day (TID) | RESPIRATORY_TRACT | Status: DC
  Administered 2024-03-20 – 2024-03-21 (×4): 0.63 mg via RESPIRATORY_TRACT
  Filled 2024-03-20 (×4): qty 3

## 2024-03-20 MED ORDER — HYDROMORPHONE HCL 1 MG/ML IJ SOLN: 0.5000 mg | INTRAMUSCULAR | Status: DC | PRN

## 2024-03-20 MED ORDER — BISACODYL 5 MG PO TBEC: 5.0000 mg | DELAYED_RELEASE_TABLET | Freq: Every day | ORAL | Status: DC | PRN

## 2024-03-20 MED ORDER — FUROSEMIDE 10 MG/ML IJ SOLN: 40.0000 mg | Freq: Two times a day (BID) | INTRAMUSCULAR | Status: DC

## 2024-03-20 MED ORDER — ONDANSETRON HCL 4 MG/2ML IJ SOLN: 4.0000 mg | Freq: Four times a day (QID) | INTRAMUSCULAR | Status: DC | PRN

## 2024-03-20 MED ORDER — NICOTINE 21 MG/24HR TD PT24
21.0000 mg | MEDICATED_PATCH | Freq: Every day | TRANSDERMAL | Status: DC
  Administered 2024-03-20 – 2024-03-23 (×4): 21 mg via TRANSDERMAL
  Filled 2024-03-20 (×4): qty 1

## 2024-03-20 MED ORDER — IPRATROPIUM BROMIDE 0.02 % IN SOLN: 0.5000 mg | Freq: Four times a day (QID) | RESPIRATORY_TRACT | Status: DC | PRN

## 2024-03-20 NOTE — Assessment & Plan Note (Signed)
-   Patient presented with shortness of breath, elevated  - BNP 1311.0 >>> - Patient has been started on IV Lasix  -Obtaining 2D echocardiogram -Monitoring labs, daily weights, I's and O's

## 2024-03-20 NOTE — Plan of Care (Signed)
   Problem: Education: Goal: Knowledge of General Education information will improve Description: Including pain rating scale, medication(s)/side effects and non-pharmacologic comfort measures Outcome: Progressing   Problem: Clinical Measurements: Goal: Ability to maintain clinical measurements within normal limits will improve Outcome: Progressing Goal: Diagnostic test results will improve Outcome: Progressing

## 2024-03-20 NOTE — Progress Notes (Addendum)
   03/20/24 2114  TOC Brief Assessment  Insurance and Status Reviewed  Patient has primary care physician Yes  Home environment has been reviewed From home  Prior level of function: Independent  Prior/Current Home Services No current home services  Social Drivers of Health Review SDOH reviewed interventions complete (smoking cessation added to AVS)  Readmission risk has been reviewed Yes  Transition of care needs no transition of care needs at this time   PCP list and smoking cessation added to AVS.  Transition of Care Department (TOC) has reviewed patient and will continue to monitor.

## 2024-03-20 NOTE — Assessment & Plan Note (Signed)
-   Typical chest pain, intermittent upper chest area - No acute changes on EKG: Rate of 84, QTc 426, - Troponin 19, 17,

## 2024-03-20 NOTE — Hospital Course (Addendum)
 MANERVIA MUSCO is a 68 year old female with extensive history of HTN, costochondritis, angioimmunoblastic lymphoma, chronic anemia of neoplastic disease presented with progressively worsening shortness of breath.  X 3 weeks.  Off which is mainly nonproductive but yellowish sputum noted.  Worsening shortness of breath with exertion.  Describing mild aching pain in upper chest area that is intermittent. Extensive history of tobacco use/abuse. Any recent illnesses.  As of having any recent upper respiratory infection, not complaining of lower extremity swelling. denies any fever, chills, nausea, vomiting, Been in remission from her lymphoma x 5.  ED course/evaluation: Blood pressure (!) 141/56, pulse 83, temperature 97.6 F (36.4 C), RR  (!) 32, SpO2 97% on RA.  LABs: Hemoglobin 7.6, sodium 132, CO2 20, troponin 19, 17, glucose 160, BNP 1311.0  Chest x-ray: Mild central pulmonary vascular congestion is noted with probable -bilateral pulmonary edema and small pleural effusions.   Patient has no history of heart failure COPD, request to be admitted for further evaluation

## 2024-03-20 NOTE — Assessment & Plan Note (Addendum)
-   Mildly elevated blood pressure, not on any medication at home -Monitor, if remain elevated anticipating antihypertensive medication before discharge

## 2024-03-20 NOTE — Assessment & Plan Note (Addendum)
-   Chronic tobacco abuse -Patient has been counseled, offered NicoDerm patch Not interested in quitting at this point

## 2024-03-20 NOTE — ED Triage Notes (Signed)
 Pt c/o of sob x 3 weeks with yellow tinged sputum. Worsening sob with movement. Having to sleep upright at night.  Pt also states have a "knot" come up on left upper chest near previous port scar 3 weeks ago. 95% on room air

## 2024-03-20 NOTE — Discharge Instructions (Signed)

## 2024-03-20 NOTE — ED Provider Notes (Signed)
 Deborah Cooley EMERGENCY DEPARTMENT AT [facility] Provider Note   CSN: [id number] Arrival date & time: 03/20/24  0941     History  Chief Complaint  Patient presents with   Shortness of Breath    Deborah Cooley is a 68 y.o. female.   Shortness of Breath Associated symptoms: chest pain and cough   Associated symptoms: no abdominal pain, no fever, no headaches and no vomiting         Deborah Cooley is a 68 y.o. female past medical history of hypertension, costochondritis, angioimmunoblastic lymphoma, anemia in neoplastic disease, who presents to the Emergency Department complaining of recently worsening shortness of breath.  Symptoms have been present for 3 weeks.  Cough is occasionally productive of clear to yellow-tinged sputum.  She endorses having some orthopnea and exertional dyspnea.  Describes an aching pain to her upper chest that is intermittent.  Long history of cigarette smoking.  Denies any fever, chills, arm neck or jaw pain or hemoptysis.  Does not use supplemental oxygen, no history of inhaler use, lymphoma currently in remission    Home Medications Prior to Admission medications   Medication Sig Start Date End Date Taking? Authorizing Provider  Adapalene  (DIFFERIN ) 0.3 % gel Apply 1 Application topically at bedtime. 05/02/23   Dellar Fenton, DO  Multiple Vitamin (MULTIVITAMIN WITH MINERALS) TABS tablet Take 1 tablet by mouth daily. 05/20/18   Rizwan, Saima, MD  nystatin -triamcinolone  ointment (MYCOLOG) Apply 1 Application topically 2 (two) times daily. 05/02/23   Dellar Fenton, DO  triamcinolone  ointment (KENALOG ) 0.5 % Apply 1 Application topically 2 (two) times daily. To rash over the forearms, legs and chest wall. Do not use on face. 02/06/23   Frankie Israel, MD      Allergies    Ambien  [zolpidem ]    Review of Systems   Review of Systems  Constitutional:  Negative for appetite change, chills, fever and unexpected weight change.   HENT:  Negative for congestion.   Respiratory:  Positive for cough and shortness of breath.   Cardiovascular:  Positive for chest pain. Negative for leg swelling.  Gastrointestinal:  Negative for abdominal pain, nausea and vomiting.  Genitourinary:  Negative for dysuria.  Musculoskeletal:  Negative for back pain.  Neurological:  Negative for dizziness, syncope, weakness, numbness and headaches.  Psychiatric/Behavioral:  Negative for confusion.     Physical Exam Updated Vital Signs BP (!) 153/105 (BP Location: Left Arm)   Pulse 84   Temp 97.6 F (36.4 C) (Oral)   Resp 15   Ht 5\' 1"  (1.549 m)   Wt 57.2 kg   SpO2 95%   BMI 23.81 kg/m  Physical Exam Vitals and nursing note reviewed.  Constitutional:      Appearance: She is well-developed. She is not toxic-appearing.  HENT:     Mouth/Throat:     Mouth: Mucous membranes are moist.     Pharynx: Oropharynx is clear.  Eyes:     Extraocular Movements: Extraocular movements intact.     Conjunctiva/sclera: Conjunctivae normal.     Pupils: Pupils are equal, round, and reactive to light.  Neck:     Vascular: No JVD.  Cardiovascular:     Rate and Rhythm: Normal rate and regular rhythm.     Pulses: Normal pulses.  Pulmonary:     Breath sounds: No wheezing or rhonchi.     Comments: Increased work of breathing on exam, lung sounds diminished bilaterally.  No rales or wheezing. Abdominal:  Palpations: Abdomen is soft.     Tenderness: There is no abdominal tenderness.  Musculoskeletal:        General: Normal range of motion.     Right lower leg: No edema.     Left lower leg: No edema.  Skin:    General: Skin is warm.     Capillary Refill: Capillary refill takes less than 2 seconds.  Neurological:     General: No focal deficit present.     Mental Status: She is alert.     Sensory: No sensory deficit.     Motor: No weakness.    ED Results / Procedures / Treatments   Labs (all labs ordered are listed, but only abnormal  results are displayed) Labs Reviewed  CBC WITH DIFFERENTIAL/PLATELET - Abnormal; Notable for the following components:      Result Value   Hemoglobin 11.6 (*)    All other components within normal limits  COMPREHENSIVE METABOLIC PANEL WITH GFR - Abnormal; Notable for the following components:   Sodium 132 (*)    CO2 20 (*)    Glucose, Bld 160 (*)    All other components within normal limits  BRAIN NATRIURETIC PEPTIDE - Abnormal; Notable for the following components:   B Natriuretic Peptide 1,311.0 (*)    All other components within normal limits  D-DIMER, QUANTITATIVE - Abnormal; Notable for the following components:   D-Dimer, Quant 0.56 (*)    All other components within normal limits  TROPONIN I (HIGH SENSITIVITY) - Abnormal; Notable for the following components:   Troponin I (High Sensitivity) 19 (*)    All other components within normal limits  RESP PANEL BY RT-PCR (RSV, FLU A&B, COVID)  [id number]  TROPONIN I (HIGH SENSITIVITY)    EKG EKG Interpretation Date/Time:  Friday Mar 20 2024 10:20:08 EDT Ventricular Rate:  84 PR Interval:  123 QRS Duration:  89 QT Interval:  360 QTC Calculation: 426 R Axis:   89  Text Interpretation: Sinus rhythm Multiple ventricular premature complexes Left atrial enlargement Borderline right axis deviation LVH with secondary repolarization abnormality Confirmed by Shyrl Doyne [phone]) on 03/20/2024 11:47:24 AM  Radiology DG Chest Portable 1 View Result Date: 03/20/2024 CLINICAL DATA:  Shortness of breath for 3 weeks. EXAM: PORTABLE CHEST 1 VIEW COMPARISON:  May 14, 2018. FINDINGS: Stable cardiomediastinal silhouette. Mild central pulmonary vascular congestion is noted with probable bilateral pulmonary edema and small pleural effusions. Bony thorax is unremarkable. IMPRESSION: Mild central pulmonary vascular congestion is noted with probable bilateral pulmonary edema and small pleural effusions. Electronically Signed   By: Rosalene Colon M.D.    On: 03/20/2024 11:30    Procedures Procedures    Medications Ordered in ED Medications  albuterol  (PROVENTIL ) (2.5 MG/3ML) 0.083% nebulizer solution 5 mg (5 mg Nebulization Given 03/20/24 1058)  ipratropium (ATROVENT ) nebulizer solution 0.5 mg (0.5 mg Nebulization Given 03/20/24 1058)  furosemide  (LASIX ) injection 40 mg (40 mg Intravenous Given 03/20/24 1146)    ED Course/ Medical Decision Making/ A&P                                 Medical Decision Making Patient here with 3-week history of intermittent chest pain, orthopnea and exertional dyspnea.  Describes an occasional cough productive of clear to yellow-tinged sputum.  No hemoptysis.  Patient's family member who is at bedside endorses undiagnosed COPD as patient is a long-term cigarette smoker.  She does not use  supplemental oxygen or inhalers at baseline.  No reported fever chills or weight loss.  Differential diagnosis would include but not limited to COPD, neoplasm, CHF, PE, pneumonia.  Initial vitals show hypertension, but repeat vitals blood pressure has improved.  Patient currently on room air  On review of medical records, patient had echocardiogram in 2019 that showed EF of 60 to 65%.  Amount and/or Complexity of Data Reviewed Labs: ordered.    Details: Labs show mildly elevated D-dimer, within age-adjusted limits, BNP elevated at greater than 1300, initial troponin slightly elevated 19, chemistries without significant derangement, no leukocytosis, Radiology: ordered.    Details: Chest x-ray shows pulmonary vascular congestion, bilateral pulmonary edema and small pleural effusions ECG/medicine tests: ordered.    Details: EKG shows sinus rhythm with multiple ventricular premature complexes and left atrial enlargement borderline right axis deviation Discussion of management or test interpretation with external provider(s): Patient here with orthopnea and exertional dyspnea likely new onset CHF, given albuterol  neb here, IV Lasix   ordered.  Will need hospital admission for further diuresis and likely repeat echocardiogram.  Patient agreeable to plan  Discussed findings with [facility] hospitalist, Dr. Eilene Grater who request cardiology be consulted as well for emergent echo, will admit  I have also consulted cards, Dr. Amanda Jungling who agrees to plan and will also consult on pt   Risk Prescription drug management. Decision regarding hospitalization.           Final Clinical Impression(s) / ED Diagnoses Final diagnoses:  Acute congestive heart failure, unspecified heart failure type [facility])    Rx / DC Orders ED Discharge Orders     None         Catherne Clubs, PA-C 03/20/24 1457    Ninetta Basket, MD 03/25/24 1106

## 2024-03-20 NOTE — Care Management Obs Status (Signed)
 MEDICARE OBSERVATION STATUS NOTIFICATION   Patient Details  Name: Deborah Cooley MRN: [id number] Date of Birth: March 07, 1956   Medicare Observation Status Notification Given:  Yes    Geraldina Klinefelter, RN 03/20/2024, 8:27 PM

## 2024-03-20 NOTE — H&P (Signed)
 History and Physical   Patient: Deborah Cooley                            PCP: Gwenevere Lent, FNP                    DOB: 08/30/1956            DOA: 03/20/2024 WUJ:[id number]             DOS: 03/20/2024, 1:58 PM  Gwenevere Lent, FNP  Patient coming from:   HOME  I have personally reviewed patient's medical records, in electronic medical records, including:  [facility], and care everywhere.    Chief Complaint:   Chief Complaint  Patient presents with   Shortness of Breath    History of present illness:    Deborah Cooley is a 68 year old female with extensive history of HTN, costochondritis, angioimmunoblastic lymphoma, chronic anemia of neoplastic disease presented with progressively worsening shortness of breath.  X 3 weeks.  Off which is mainly nonproductive but yellowish sputum noted.  Worsening shortness of breath with exertion.  Describing mild aching pain in upper chest area that is intermittent. Extensive history of tobacco use/abuse. Any recent illnesses.  As of having any recent upper respiratory infection, not complaining of lower extremity swelling. denies any fever, chills, nausea, vomiting, Been in remission from her lymphoma x 5.  ED course/evaluation: Blood pressure (!) 141/56, pulse 83, temperature 97.6 F (36.4 C), RR  (!) 32, SpO2 97% on RA.  LABs: Hemoglobin 7.6, sodium 132, CO2 20, troponin 19, 17, glucose 160, BNP 1311.0  Chest x-ray: Mild central pulmonary vascular congestion is noted with probable -bilateral pulmonary edema and small pleural effusions.   Patient has no history of heart failure COPD, request to be admitted for further evaluation   Patient Denies having: Fever, Chills, Cough, SOB, Chest Pain, Abd pain, N/V/D, headache, dizziness, lightheadedness,  Dysuria, Joint pain, rash, open wounds  Review of Systems: As per HPI, otherwise 10 point review of systems were negative.    ----------------------------------------------------------------------------------------------------------------------  Allergies  Allergen Reactions   Ambien  [Zolpidem ] Other (See Comments)     Confusion, lightheadedness, dizzy, bad dreams    Home MEDs:  Prior to Admission medications   Medication Sig Start Date End Date Taking? Authorizing Provider  guaiFENesin  (MUCINEX ) 600 MG 12 hr tablet Take 600 mg by mouth 2 (two) times daily as needed for cough.   Yes [provider]    PRN MEDs: acetaminophen  **OR** acetaminophen , bisacodyl, hydrALAZINE, HYDROmorphone  (DILAUDID ) injection, ipratropium, levalbuterol , ondansetron  **OR** ondansetron  (ZOFRAN ) IV, oxyCODONE, senna-docusate, sodium phosphate , traZODone   Past Medical History:  Diagnosis Date   angioimmunoblastic lymphoma dx'd 03/2018   BCC (basal cell carcinoma of skin) 05/02/2023   left nasal crease - schedule for Mohs   BCC (basal cell carcinoma), face 05/02/2023   mid tip of nose - schedule for Mohs   Bilateral pleural effusion 04/17/2018   Endometriosis    Essential hypertension, benign    Family history of adverse reaction to anesthesia    "sister stops breathing I think" (04/18/2018)   Heart murmur    Mixed hyperlipidemia    Pneumonia 04/17/2018    Past Surgical History:  Procedure Laterality Date   ABDOMINAL HYSTERECTOMY  1970s/1980s   for endometriosis   Arthroscopic right shoulder surgery     AXILLARY LYMPH NODE BIOPSY Left 04/21/2018   Procedure: LEFT AXILLARY LYMPH NODE BIOPSY;  Surgeon: Kinsinger, Alphonso Aschoff, MD;  Location: [facility] OR;  Service: General;  Laterality: Left;   CARPAL TUNNEL RELEASE Right    GANGLION CYST EXCISION Right    IR IMAGING GUIDED PORT INSERTION  05/07/2018   IR REMOVAL TUN ACCESS W/ PORT W/O FL MOD SED  12/23/2018   MULTIPLE TOOTH EXTRACTIONS     OOPHORECTOMY  1997   PARTIAL HYSTERECTOMY  1984   Right hand surgery     SHOULDER OPEN ROTATOR CUFF REPAIR Right    VESICOVAGINAL  FISTULA CLOSURE W/ TAH       reports that she has been smoking cigarettes. She has a 69 pack-year smoking history. She has been exposed to tobacco smoke. She has never used smokeless tobacco. She reports that she does not currently use alcohol. She reports that she does not currently use drugs.   Family History  Problem Relation Age of Onset   Diabetes Mother        age 8   Heart attack Father        died age 79's   Hypertension Father    Cancer Sister        breast cancer diagonsed age 75 years   Diabetes Other    Heart disease Other        Female <55   Arthritis Other    Asthma Other    Hyperlipidemia Other        several siblings   Thyroid disease Sister        one sister with thyroid disease    Physical Exam:   Vitals:   03/20/24 1000 03/20/24 1015 03/20/24 1058 03/20/24 1100  BP: (!) 142/73 (!) 150/83  (!) 141/56  Pulse: 90 84  83  Resp: (!) 28 (!) 23  (!) 32  Temp:      TempSrc:      SpO2: 93% 95% 97% 97%  Weight:      Height:       Constitutional: NAD, calm, comfortable Eyes: PERRL, lids and conjunctivae normal ENMT: Mucous membranes are moist. Posterior pharynx clear of any exudate or lesions.Normal dentition.  Neck: normal, supple, no masses, no thyromegaly Respiratory: clear to auscultation bilaterally, no wheezing, no crackles. Normal respiratory effort. No accessory muscle use.  Cardiovascular: Regular rate and rhythm, no murmurs / rubs / gallops. No extremity edema. 2+ pedal pulses. No carotid bruits.  Abdomen: no tenderness, no masses palpated. No hepatosplenomegaly. Bowel sounds positive.  Musculoskeletal: no clubbing / cyanosis. No joint deformity upper and lower extremities. Good ROM, no contractures. Normal muscle tone.  Neurologic: CN II-XII grossly intact. Sensation intact, DTR normal. Strength 5/5 in all 4.  Psychiatric: Normal judgment and insight. Alert and oriented x 3. Normal mood.  Skin: no rashes, lesions, ulcers. No induration           Labs on admission:    I have personally reviewed following labs and imaging studies  CBC: Recent Labs  Lab 03/20/24 1021  WBC 7.4  NEUTROABS 4.7  HGB 11.6*  HCT 36.0  MCV 86.1  PLT 166   Basic Metabolic Panel: Recent Labs  Lab 03/20/24 1021 03/20/24 1305  NA 132*  --   K 3.9  --   CL 102  --   CO2 20*  --   GLUCOSE 160*  --   BUN 14  --   CREATININE 0.89  --   CALCIUM 9.5  --   MG  --  2.0  PHOS  --  3.5  GFR: Estimated Creatinine Clearance: 45.7 mL/min (by C-G formula based on SCr of 0.89 mg/dL). Liver Function Tests:  Urine analysis:    Component Value Date/Time   COLORURINE AMBER (A) 04/18/2018 0552   APPEARANCEUR HAZY (A) 04/18/2018 0552   LABSPEC 1.020 04/18/2018 0552   PHURINE 5.0 04/18/2018 0552   GLUCOSEU NEGATIVE 04/18/2018 0552   HGBUR NEGATIVE 04/18/2018 0552   BILIRUBINUR NEGATIVE 04/18/2018 0552   KETONESUR 5 (A) 04/18/2018 0552   PROTEINUR 30 (A) 04/18/2018 0552   NITRITE NEGATIVE 04/18/2018 0552   LEUKOCYTESUR NEGATIVE 04/18/2018 0552    Last A1C:  No results found for: "HGBA1C"   Radiologic Exams on Admission:   DG Chest Portable 1 View Result Date: 03/20/2024 CLINICAL DATA:  Shortness of breath for 3 weeks. EXAM: PORTABLE CHEST 1 VIEW COMPARISON:  May 14, 2018. FINDINGS: Stable cardiomediastinal silhouette. Mild central pulmonary vascular congestion is noted with probable bilateral pulmonary edema and small pleural effusions. Bony thorax is unremarkable. IMPRESSION: Mild central pulmonary vascular congestion is noted with probable bilateral pulmonary edema and small pleural effusions. Electronically Signed   By: Rosalene Colon M.D.   On: 03/20/2024 11:30    EKG:   Independently reviewed.  Orders placed or performed during the hospital encounter of 03/20/24   ED EKG   ED EKG   EKG 12-Lead   EKG 12-Lead   EKG 12-Lead    ---------------------------------------------------------------------------------------------------------------------------------------    Assessment / Plan:   Principal Problem:   SOB (shortness of breath) Active Problems:   Chest pain   Essential hypertension, benign   Elevated brain natriuretic peptide (BNP) level   Tobacco use   Angioimmunoblastic lymphoma (HCC)   Anemia in neoplastic disease   Assessment and Plan: * SOB (shortness of breath) Acute respiratory distress, with dyspnea No history of COPD, CHF, PE or DVT -Currently nightly satting 97% on room air -Tachypneic with heart rate of 32, afebrile, not tachycardic -Continue as needed DuoNeb bronchodilators -BNP, with chest x-ray consistent with congestion -Ruling out acute heart failure -Change IV diuretics, I's and O's, daily weight -Follow-up with 2D echocardiogram -Will follow-up with a CT angiogram  Elevated brain natriuretic peptide (BNP) level - Patient presented with shortness of breath, elevated  - BNP 1311.0 >>> - Patient has been started on IV Lasix  -Obtaining 2D echocardiogram -Monitoring labs, daily weights, I's and O's  Essential hypertension, benign - Mildly elevated blood pressure, not on any medication at home -Monitor, if remain elevated anticipating antihypertensive medication before discharge  Chest pain - Typical chest pain, intermittent upper chest area - No acute changes on EKG: Rate of 84, QTc 426, - Troponin 19, 17,   Tobacco use - Chronic tobacco abuse -Patient has been counseled, offered NicoDerm patch Not interested in quitting at this point         Consults called: Cardiology -------------------------------------------------------------------------------------------------------------------------------------------- DVT prophylaxis:  heparin  injection 5,000 Units Start: 03/20/24 2200 TED hose Start: 03/20/24 1305 SCDs Start: 03/20/24 1305   Code Status:   Code  Status: Full Code   Admission status: Patient will be admitted as Observation, with a greater than 2 midnight length of stay. Level of care: Telemetry   Family Communication:  none at bedside  (The above findings and plan of care has been discussed with patient in detail, the patient expressed understanding and agreement of above plan)  --------------------------------------------------------------------------------------------------------------------------------------------------  Disposition Plan:  Anticipated 1-2 days Status is: Observation The patient remains OBS appropriate and will d/c before 2 midnights.     ----------------------------------------------------------------------------------------------------------------------------------------------------  Time spent:  57  Min.  Was spent seeing and evaluating the patient, reviewing all medical records, drawn plan of care.  SIGNED: Bobbetta Burnet, MD, FHM. FAAFP. [place] [facility], Pager  (Please use [email or web address] to page/ or secure chat through epic) If 7PM-7AM, please contact night-coverage [email or web address],  03/20/2024, 1:58 PM

## 2024-03-20 NOTE — Assessment & Plan Note (Addendum)
 Acute respiratory distress, with dyspnea No history of COPD, CHF, PE or DVT -Currently nightly satting 97% on room air -Tachypneic with heart rate of 32, afebrile, not tachycardic -Continue as needed DuoNeb bronchodilators -BNP, with chest x-ray consistent with congestion -Ruling out acute heart failure -Change IV diuretics, I's and O's, daily weight -Follow-up with 2D echocardiogram -Will follow-up with a CT angiogram

## 2024-03-21 ENCOUNTER — Observation Stay (HOSPITAL_COMMUNITY)

## 2024-03-21 DIAGNOSIS — R0602 Shortness of breath: Secondary | ICD-10-CM | POA: Diagnosis not present

## 2024-03-21 DIAGNOSIS — R0609 Other forms of dyspnea: Secondary | ICD-10-CM | POA: Diagnosis not present

## 2024-03-21 LAB — BASIC METABOLIC PANEL WITH GFR
Anion gap: 13 (ref 5–15)
BUN: 18 mg/dL (ref 8–23)
CO2: 25 mmol/L (ref 22–32)
Calcium: 9.5 mg/dL (ref 8.9–10.3)
Chloride: 98 mmol/L (ref 98–111)
Creatinine, Ser: 1.12 mg/dL — ABNORMAL HIGH (ref 0.44–1.00)
GFR, Estimated: 54 mL/min — ABNORMAL LOW (ref >60)
Glucose, Bld: 238 mg/dL — ABNORMAL HIGH (ref 70–99)
Potassium: 3.1 mmol/L — ABNORMAL LOW (ref 3.5–5.1)
Sodium: 136 mmol/L (ref 135–145)

## 2024-03-21 LAB — ECHOCARDIOGRAM COMPLETE
AR max vel: 1.51 cm2
AV Area VTI: 1.37 cm2
AV Area mean vel: 1.95 cm2
AV Mean grad: 2.5 mmHg
AV Peak grad: 6 mmHg
Ao pk vel: 1.22 m/s
Area-P 1/2: 6.07 cm2
Calc EF: 31.2 %
Height: 62 in
S' Lateral: 3.5 cm
Single Plane A2C EF: 31.2 %
Single Plane A4C EF: 32.5 %
Weight: 1922.41 [oz_av]

## 2024-03-21 LAB — GLUCOSE, CAPILLARY: Glucose-Capillary: 141 mg/dL — ABNORMAL HIGH (ref 70–99)

## 2024-03-21 LAB — BRAIN NATRIURETIC PEPTIDE: B Natriuretic Peptide: 939 pg/mL — ABNORMAL HIGH (ref 0.0–100.0)

## 2024-03-21 MED ORDER — LEVALBUTEROL HCL 0.63 MG/3ML IN NEBU
0.6300 mg | INHALATION_SOLUTION | Freq: Four times a day (QID) | RESPIRATORY_TRACT | Status: DC | PRN
  Administered 2024-03-22 (×2): 0.63 mg via RESPIRATORY_TRACT
  Filled 2024-03-21 (×2): qty 3

## 2024-03-21 MED ORDER — IPRATROPIUM BROMIDE 0.02 % IN SOLN
0.5000 mg | Freq: Four times a day (QID) | RESPIRATORY_TRACT | Status: DC | PRN
  Administered 2024-03-22 (×2): 0.5 mg via RESPIRATORY_TRACT
  Filled 2024-03-21 (×2): qty 2.5

## 2024-03-21 MED ORDER — POTASSIUM CHLORIDE CRYS ER 20 MEQ PO TBCR
40.0000 meq | EXTENDED_RELEASE_TABLET | Freq: Two times a day (BID) | ORAL | Status: AC
  Administered 2024-03-21 (×2): 40 meq via ORAL
  Filled 2024-03-21 (×2): qty 2

## 2024-03-21 NOTE — Progress Notes (Signed)
   03/21/24 0800  ReDS Vest / Clip  Station Marker A  Ruler Value 31  ReDS Value Range < 36  ReDS Actual Value 29

## 2024-03-21 NOTE — Progress Notes (Signed)
 PROGRESS NOTE    KENDYLE PERIN  UJW:[id number] DOB: [birth year] DOA: 03/20/2024 PCP: Gwenevere Lent, FNP   Brief Narrative:    DERIONNA HUDMAN is a 68 year old female with extensive history of HTN, costochondritis, angioimmunoblastic lymphoma, chronic anemia of neoplastic disease presented with progressively worsening shortness of breath.  She appears to have some volume overload possibly related to heart failure with 2D echocardiogram pending.  She appears to have diuresed quite well and now appears euvolemic and has some mild creatinine elevation.  Further diuresis with Lasix  has been held at this point.  Assessment & Plan:   Principal Problem:   SOB (shortness of breath) Active Problems:   Chest pain   Essential hypertension, benign   Elevated brain natriuretic peptide (BNP) level   Tobacco use   Angioimmunoblastic lymphoma (HCC)   Anemia in neoplastic disease  Assessment and Plan:  SOB (shortness of breath) Acute respiratory distress, with dyspnea No history of COPD, CHF, PE or DVT -Currently nightly satting 97% on room air -Tachypneic with heart rate of 32, afebrile, not tachycardic -Continue as needed DuoNeb bronchodilators -BNP, with chest x-ray consistent with congestion -Ruling out acute heart failure - Reds clip score of 29% on 5/3, hold further IV diuretics -Follow-up with 2D echocardiogram pending - CT angiogram without signs of PE and noted to have some pleural effusions with atelectasis   Elevated brain natriuretic peptide (BNP) level - Patient presented with shortness of breath, elevated  - BNP 1311.0 >>> 939 - Patient has been started on IV Lasix , now held on account of euvolemia and creatinine bump -Obtaining 2D echocardiogram-currently pending -Monitoring labs, daily weights, I's and O's   Essential hypertension, benign - Mildly elevated blood pressure, not on any medication at home -Monitor, if remain elevated anticipating antihypertensive  medication before discharge   Chest pain - Typical chest pain, intermittent upper chest area - No acute changes on EKG: Rate of 84, QTc 426, - Troponin 19, 17,     Tobacco use - Chronic tobacco abuse -Patient has been counseled, offered NicoDerm patch Not interested in quitting at this point  Hypokalemia - Replete and reevaluate in a.m.  DVT prophylaxis:Heparin  Code Status: Full Family Communication: Daughter at bedside 5/3 Disposition Plan:  Status is: Observation The patient will require care spanning > 2 midnights and should be moved to inpatient because: Need for IV medications and further evaluation.   Consultants:  None  Procedures:  None  Antimicrobials:  None   Subjective: Patient seen and evaluated today with no new acute complaints or concerns. No acute concerns or events noted overnight.  States that shortness of breath has improved, continues to have cough and congestion however.  Objective: Vitals:   03/20/24 1445 03/20/24 2138 03/20/24 2319 03/21/24 0342  BP: (!) 141/50 131/76  138/72  Pulse: 78 73  63  Resp: 16 (!) 22  20  Temp: 98.4 F (36.9 C) 97.8 F (36.6 C)  98.6 F (37 C)  TempSrc: Oral Oral  Oral  SpO2: 100% 94% 97% 95%  Weight: 56 kg   54.5 kg  Height: 5\' 2"  (1.575 m)       Intake/Output Summary (Last 24 hours) at 03/21/2024 0705 Last data filed at 03/21/2024 0446 Gross per 24 hour  Intake 1203 ml  Output 2200 ml  Net -997 ml   Filed Weights   03/20/24 0957 03/20/24 1445 03/21/24 0342  Weight: 57.2 kg 56 kg 54.5 kg    Examination:  General exam: Appears calm  and comfortable  Respiratory system: Clear to auscultation. Respiratory effort normal. Cardiovascular system: S1 & S2 heard, RRR.  Gastrointestinal system: Abdomen is soft Central nervous system: Alert and awake Extremities: No edema Skin: No significant lesions noted Psychiatry: Flat affect.    Data Reviewed: I have personally reviewed following labs and imaging  studies  CBC: Recent Labs  Lab 03/20/24 1021  WBC 7.4  NEUTROABS 4.7  HGB 11.6*  HCT 36.0  MCV 86.1  PLT 166   Basic Metabolic Panel: Recent Labs  Lab 03/20/24 1021 03/20/24 1305 03/21/24 0514  NA 132*  --  136  K 3.9  --  3.1*  CL 102  --  98  CO2 20*  --  25  GLUCOSE 160*  --  238*  BUN 14  --  18  CREATININE 0.89  --  1.12*  CALCIUM 9.5  --  9.5  MG  --  2.0  --   PHOS  --  3.5  --    GFR: Estimated Creatinine Clearance: 38 mL/min (A) (by C-G formula based on SCr of 1.12 mg/dL (H)). Liver Function Tests: Recent Labs  Lab 03/20/24 1021  AST 17  ALT 21  ALKPHOS 46  BILITOT 1.0  PROT 6.7  ALBUMIN 4.1   No results for input(s): "LIPASE", "AMYLASE" in the last 168 hours. No results for input(s): "AMMONIA" in the last 168 hours. Coagulation Profile: No results for input(s): "INR", "PROTIME" in the last 168 hours. Cardiac Enzymes: No results for input(s): "CKTOTAL", "CKMB", "CKMBINDEX", "TROPONINI" in the last 168 hours. BNP (last 3 results) No results for input(s): "PROBNP" in the last 8760 hours. HbA1C: Recent Labs    03/20/24 1550  HGBA1C 6.3*   CBG: No results for input(s): "GLUCAP" in the last 168 hours. Lipid Profile: Recent Labs    03/20/24 1406  CHOL 204*  HDL 42  LDLCALC 141*  TRIG 103  CHOLHDL 4.9   Thyroid Function Tests: No results for input(s): "TSH", "T4TOTAL", "FREET4", "T3FREE", "THYROIDAB" in the last 72 hours. Anemia Panel: No results for input(s): "VITAMINB12", "FOLATE", "FERRITIN", "TIBC", "IRON", "RETICCTPCT" in the last 72 hours. Sepsis Labs: No results for input(s): "PROCALCITON", "LATICACIDVEN" in the last 168 hours.  Recent Results (from the past 240 hours)  Resp panel by RT-PCR (RSV, Flu A&B, Covid) Anterior Nasal Swab     Status: None   Collection Time: 03/20/24 10:46 AM   Specimen: Anterior Nasal Swab  Result Value Ref Range Status   SARS Coronavirus 2 by RT PCR NEGATIVE NEGATIVE Final    Comment:  (NOTE) SARS-CoV-2 target nucleic acids are NOT DETECTED.  The SARS-CoV-2 RNA is generally detectable in upper respiratory specimens during the acute phase of infection. The lowest concentration of SARS-CoV-2 viral copies this assay can detect is 138 copies/mL. A negative result does not preclude SARS-Cov-2 infection and should not be used as the sole basis for treatment or other patient management decisions. A negative result may occur with  improper specimen collection/handling, submission of specimen other than nasopharyngeal swab, presence of viral mutation(s) within the areas targeted by this assay, and inadequate number of viral copies(<138 copies/mL). A negative result must be combined with clinical observations, patient history, and epidemiological information. The expected result is Negative.  Fact Sheet for Patients:  BloggerCourse.com  Fact Sheet for Healthcare Providers:  SeriousBroker.it  This test is no t yet approved or cleared by the [place]  FDA and  has been authorized for detection and/or diagnosis of SARS-CoV-2 by FDA  under an Emergency Use Authorization (EUA). This EUA will remain  in effect (meaning this test can be used) for the duration of the COVID-19 declaration under Section 564(b)(1) of the Act, 21 U.S.C.section 360bbb-3(b)(1), unless the authorization is terminated  or revoked sooner.       Influenza A by PCR NEGATIVE NEGATIVE Final   Influenza B by PCR NEGATIVE NEGATIVE Final    Comment: (NOTE) The Xpert Xpress SARS-CoV-2/FLU/RSV plus assay is intended as an aid in the diagnosis of influenza from Nasopharyngeal swab specimens and should not be used as a sole basis for treatment. Nasal washings and aspirates are unacceptable for Xpert Xpress SARS-CoV-2/FLU/RSV testing.  Fact Sheet for Patients: BloggerCourse.com  Fact Sheet for Healthcare  Providers: SeriousBroker.it  This test is not yet approved or cleared by the [place]  FDA and has been authorized for detection and/or diagnosis of SARS-CoV-2 by FDA under an Emergency Use Authorization (EUA). This EUA will remain in effect (meaning this test can be used) for the duration of the COVID-19 declaration under Section 564(b)(1) of the Act, 21 U.S.C. section 360bbb-3(b)(1), unless the authorization is terminated or revoked.     Resp Syncytial Virus by PCR NEGATIVE NEGATIVE Final    Comment: (NOTE) Fact Sheet for Patients: BloggerCourse.com  Fact Sheet for Healthcare Providers: SeriousBroker.it  This test is not yet approved or cleared by the [place]  FDA and has been authorized for detection and/or diagnosis of SARS-CoV-2 by FDA under an Emergency Use Authorization (EUA). This EUA will remain in effect (meaning this test can be used) for the duration of the COVID-19 declaration under Section 564(b)(1) of the Act, 21 U.S.C. section 360bbb-3(b)(1), unless the authorization is terminated or revoked.  Performed at [facility], [address], [place], [place] [address]          Radiology Studies: CT Angio Chest Pulmonary Embolism (PE) W or WO Contrast Result Date: 03/20/2024 CLINICAL DATA:  Concern for pulmonary embolism. EXAM: CT ANGIOGRAPHY CHEST WITH CONTRAST TECHNIQUE: Multidetector CT imaging of the chest was performed using the standard protocol during bolus administration of intravenous contrast. Multiplanar CT image reconstructions and MIPs were obtained to evaluate the vascular anatomy. RADIATION DOSE REDUCTION: This exam was performed according to the departmental dose-optimization program which includes automated exposure control, adjustment of the mA and/or kV according to patient size and/or use of iterative reconstruction technique. CONTRAST:  75mL OMNIPAQUE  IOHEXOL  350 MG/ML  SOLN COMPARISON:  CT chest abdomen pelvis dated 04/01/2020. FINDINGS: Cardiovascular: There is no cardiomegaly or pericardial effusion. Moderate atherosclerotic calcification of the aorta. No aneurysmal dilatation or dissection. No pulmonary artery embolus identified. Mediastinum/Nodes: No hilar or mediastinal adenopathy. The esophagus is grossly unremarkable. No mediastinal fluid collection. Lungs/Pleura: Small bilateral pleural effusions with partial compressive atelectasis of the lower lobes. No pneumothorax. The central airways are patent. Upper Abdomen: No acute abnormality. Musculoskeletal: No chest wall abnormality. No acute or significant osseous findings. Review of the MIP images confirms the above findings. IMPRESSION: 1. No CT evidence of pulmonary embolism. 2. Small bilateral pleural effusions with partial compressive atelectasis of the lower lobes. 3.  Aortic Atherosclerosis (ICD10-I70.0). Electronically Signed   By: Angus Bark M.D.   On: 03/20/2024 16:45   DG Chest Portable 1 View Result Date: 03/20/2024 CLINICAL DATA:  Shortness of breath for 3 weeks. EXAM: PORTABLE CHEST 1 VIEW COMPARISON:  May 14, 2018. FINDINGS: Stable cardiomediastinal silhouette. Mild central pulmonary vascular congestion is noted with probable bilateral pulmonary edema and small pleural effusions. Bony thorax  is unremarkable. IMPRESSION: Mild central pulmonary vascular congestion is noted with probable bilateral pulmonary edema and small pleural effusions. Electronically Signed   By: Rosalene Colon M.D.   On: 03/20/2024 11:30     Scheduled Meds:  furosemide   40 mg Intravenous BID   heparin   5,000 Units Subcutaneous Q8H   ipratropium  0.5 mg Nebulization Q8H   levalbuterol   0.63 mg Nebulization Q8H   nicotine   21 mg Transdermal Daily   sodium chloride  flush  3 mL Intravenous Q12H   sodium chloride  flush  3 mL Intravenous Q12H     LOS: 0 days    Time spent: 55 minutes    Nikyla Navedo Loran Rock, DO [facility]  If 7PM-7AM, please contact night-coverage [email or web address] 03/21/2024, 7:05 AM

## 2024-03-21 NOTE — Plan of Care (Signed)
  Problem: Acute Rehab PT Goals(only PT should resolve) Goal: Pt Will Go Supine/Side To Sit Outcome: Progressing Flowsheets (Taken 03/21/2024 0845) Pt will go Supine/Side to Sit: Independently Goal: Patient Will Transfer Sit To/From Stand Outcome: Progressing Flowsheets (Taken 03/21/2024 0845) Patient will transfer sit to/from stand: Independently Goal: Pt Will Transfer Bed To Chair/Chair To Bed Outcome: Progressing Flowsheets (Taken 03/21/2024 0845) Pt will Transfer Bed to Chair/Chair to Bed: Independently Goal: Pt Will Ambulate Outcome: Progressing Flowsheets (Taken 03/21/2024 0845) Pt will Ambulate:  > 125 feet  Independently

## 2024-03-21 NOTE — Evaluation (Signed)
 Physical Therapy Evaluation Patient Details Name: Deborah Cooley MRN: [id number] DOB: [birth year] Today's Date: 03/21/2024  History of Present Illness  Deborah Cooley is a 68 year old female with extensive history of HTN, costochondritis, angioimmunoblastic lymphoma, chronic anemia of neoplastic disease presented with progressively worsening shortness of breath.  X 3 weeks.  Off which is mainly nonproductive but yellowish sputum noted.  Worsening shortness of breath with exertion.  Describing mild aching pain in upper chest area that is intermittent.  Extensive history of tobacco use/abuse.  Any recent illnesses.  As of having any recent upper respiratory infection, not complaining of lower extremity swelling. denies any fever, chills, nausea, vomiting,  Been in remission from her lymphoma x 5    Clinical Impression  Patient sitting up on the edge of the bed drinking coffee on therapist arrival.  Daughter is present at bedside.  Patient is agreeable to therapist assessment.  Patient performs sit to stand with SBA/CGA and ambulates the hallway x 150 ft with CGA/SBA; noted slow gait speed but no loss of balance or shortness of breath noted.  She is able to carry on a conversation throughout her walk.  Patient returns to her room and chair.  patient left in chair with call button in reach, daughter in her room and nursing notified of mobility status.  Patient will benefit from continued skilled therapy services during the remainder of her hospital stay and at the next recommended venue of care/ outpatient therapy to address deficits and promote return to optimal function.             If plan is discharge home, recommend the following: A little help with bathing/dressing/bathroom;A little help with walking and/or transfers;Help with stairs or ramp for entrance   Can travel by private vehicle        Equipment Recommendations None recommended by PT  Recommendations for Other Services        Functional Status Assessment Patient has had a recent decline in their functional status and demonstrates the ability to make significant improvements in function in a reasonable and predictable amount of time.     Precautions / Restrictions Precautions Precautions: None Recall of Precautions/Restrictions: Intact Restrictions Weight Bearing Restrictions Per Provider Order: No      Mobility  Bed Mobility Overal bed mobility: Modified Independent               Patient Response: Cooperative  Transfers Overall transfer level: Modified independent                      Ambulation/Gait Ambulation/Gait assistance: Supervision, Contact guard assist Gait Distance (Feet): 150 Feet   Gait Pattern/deviations: Decreased step length - right, Decreased step length - left Gait velocity: decreased        Stairs            Wheelchair Mobility     Tilt Bed Tilt Bed Patient Response: Cooperative  Modified Rankin (Stroke Patients Only)       Balance Overall balance assessment: Needs assistance Sitting-balance support: No upper extremity supported, Feet supported Sitting balance-Leahy Scale: Good     Standing balance support: No upper extremity supported, During functional activity Standing balance-Leahy Scale: Good Standing balance comment: fair to good standing balance with CGA for safety                             Pertinent Vitals/Pain Pain Assessment Pain Assessment: Faces Faces Pain  Scale: Hurts a little bit Pain Location: ribs, buttocks Pain Descriptors / Indicators: Sore Pain Intervention(s): Monitored during session    Home Living Family/patient expects to be discharged to:: Private residence Living Arrangements: Spouse/significant other Available Help at Discharge: Family Type of Home: Mobile home Home Access: Stairs to enter Entrance Stairs-Rails: Right;Left;Can reach both Secretary/administrator of Steps: 5   Home Layout:  One level Home Equipment: None      Prior Function Prior Level of Function : Independent/Modified Independent                     Extremity/Trunk Assessment   Upper Extremity Assessment Upper Extremity Assessment: Defer to OT evaluation;Right hand dominant    Lower Extremity Assessment Lower Extremity Assessment: Generalized weakness    Cervical / Trunk Assessment Cervical / Trunk Assessment: Normal  Communication   Communication Communication: No apparent difficulties    Cognition Arousal: Alert Behavior During Therapy: WFL for tasks assessed/performed   PT - Cognitive impairments: No apparent impairments                         Following commands: Intact       Cueing       General Comments      Exercises     Assessment/Plan    PT Assessment Patient needs continued PT services  PT Problem List Decreased strength;Decreased activity tolerance       PT Treatment Interventions Gait training;Functional mobility training;Therapeutic activities;Therapeutic exercise    PT Goals (Current goals can be found in the Care Plan section)  Acute Rehab PT Goals Patient Stated Goal: return home    Frequency Min 2X/week     Co-evaluation               AM-PAC PT "6 Clicks" Mobility  Outcome Measure Help needed turning from your back to your side while in a flat bed without using bedrails?: None Help needed moving from lying on your back to sitting on the side of a flat bed without using bedrails?: None Help needed moving to and from a bed to a chair (including a wheelchair)?: A Little Help needed standing up from a chair using your arms (e.g., wheelchair or bedside chair)?: A Little Help needed to walk in hospital room?: A Little Help needed climbing 3-5 steps with a railing? : A Little 6 Click Score: 20    End of Session   Activity Tolerance: Patient tolerated treatment well Patient left: in chair;with family/visitor present Nurse  Communication: Mobility status PT Visit Diagnosis: Muscle weakness (generalized) (M62.81);Other abnormalities of gait and mobility (R26.89)    Time: 1027-2536 PT Time Calculation (min) (ACUTE ONLY): 18 min   Charges:   PT Evaluation $PT Eval Moderate Complexity: 1 Mod   PT General Charges $$ ACUTE PT VISIT: 1 Visit         8:44 AM, 03/21/24 Issachar Broady Small Kaveon Blatz MPT [facility] [place] [phone] Ph:[phone]

## 2024-03-21 NOTE — Plan of Care (Signed)
  Problem: Health Behavior/Discharge Planning: Goal: Ability to manage health-related needs will improve Outcome: Progressing   Problem: Clinical Measurements: Goal: Ability to maintain clinical measurements within normal limits will improve Outcome: Progressing Goal: Will remain free from infection Outcome: Progressing Goal: Diagnostic test results will improve Outcome: Progressing Goal: Respiratory complications will improve Outcome: Progressing   Problem: Elimination: Goal: Will not experience complications related to bowel motility Outcome: Progressing Goal: Will not experience complications related to urinary retention Outcome: Progressing   Problem: Pain Managment: Goal: General experience of comfort will improve and/or be controlled Outcome: Progressing   Problem: Safety: Goal: Ability to remain free from injury will improve Outcome: Progressing   Problem: Skin Integrity: Goal: Risk for impaired skin integrity will decrease Outcome: Progressing

## 2024-03-22 DIAGNOSIS — D630 Anemia in neoplastic disease: Secondary | ICD-10-CM | POA: Diagnosis present

## 2024-03-22 DIAGNOSIS — I5021 Acute systolic (congestive) heart failure: Secondary | ICD-10-CM | POA: Diagnosis not present

## 2024-03-22 DIAGNOSIS — I10 Essential (primary) hypertension: Secondary | ICD-10-CM

## 2024-03-22 DIAGNOSIS — Z720 Tobacco use: Secondary | ICD-10-CM | POA: Diagnosis not present

## 2024-03-22 DIAGNOSIS — R7989 Other specified abnormal findings of blood chemistry: Secondary | ICD-10-CM | POA: Diagnosis not present

## 2024-03-22 DIAGNOSIS — I5023 Acute on chronic systolic (congestive) heart failure: Secondary | ICD-10-CM | POA: Diagnosis present

## 2024-03-22 DIAGNOSIS — Z888 Allergy status to other drugs, medicaments and biological substances status: Secondary | ICD-10-CM | POA: Diagnosis not present

## 2024-03-22 DIAGNOSIS — C859A Non-Hodgkin lymphoma, unspecified, in remission: Secondary | ICD-10-CM | POA: Diagnosis present

## 2024-03-22 DIAGNOSIS — R0602 Shortness of breath: Secondary | ICD-10-CM | POA: Diagnosis present

## 2024-03-22 DIAGNOSIS — Z716 Tobacco abuse counseling: Secondary | ICD-10-CM | POA: Diagnosis not present

## 2024-03-22 DIAGNOSIS — R0603 Acute respiratory distress: Secondary | ICD-10-CM | POA: Diagnosis present

## 2024-03-22 DIAGNOSIS — Z85828 Personal history of other malignant neoplasm of skin: Secondary | ICD-10-CM | POA: Diagnosis not present

## 2024-03-22 DIAGNOSIS — E876 Hypokalemia: Secondary | ICD-10-CM | POA: Diagnosis present

## 2024-03-22 DIAGNOSIS — E782 Mixed hyperlipidemia: Secondary | ICD-10-CM | POA: Diagnosis present

## 2024-03-22 DIAGNOSIS — Z8249 Family history of ischemic heart disease and other diseases of the circulatory system: Secondary | ICD-10-CM | POA: Diagnosis not present

## 2024-03-22 DIAGNOSIS — Z1152 Encounter for screening for COVID-19: Secondary | ICD-10-CM | POA: Diagnosis not present

## 2024-03-22 DIAGNOSIS — Z825 Family history of asthma and other chronic lower respiratory diseases: Secondary | ICD-10-CM | POA: Diagnosis not present

## 2024-03-22 DIAGNOSIS — I110 Hypertensive heart disease with heart failure: Secondary | ICD-10-CM | POA: Diagnosis present

## 2024-03-22 DIAGNOSIS — F17210 Nicotine dependence, cigarettes, uncomplicated: Secondary | ICD-10-CM | POA: Diagnosis present

## 2024-03-22 DIAGNOSIS — Z79899 Other long term (current) drug therapy: Secondary | ICD-10-CM | POA: Diagnosis not present

## 2024-03-22 LAB — GLUCOSE, CAPILLARY: Glucose-Capillary: 150 mg/dL — ABNORMAL HIGH (ref 70–99)

## 2024-03-22 LAB — BASIC METABOLIC PANEL WITH GFR
Anion gap: 9 (ref 5–15)
BUN: 17 mg/dL (ref 8–23)
CO2: 21 mmol/L — ABNORMAL LOW (ref 22–32)
Calcium: 9.6 mg/dL (ref 8.9–10.3)
Chloride: 105 mmol/L (ref 98–111)
Creatinine, Ser: 1.11 mg/dL — ABNORMAL HIGH (ref 0.44–1.00)
GFR, Estimated: 54 mL/min — ABNORMAL LOW (ref >60)
Glucose, Bld: 163 mg/dL — ABNORMAL HIGH (ref 70–99)
Potassium: 4.6 mmol/L (ref 3.5–5.1)
Sodium: 135 mmol/L (ref 135–145)

## 2024-03-22 LAB — BRAIN NATRIURETIC PEPTIDE: B Natriuretic Peptide: 1296 pg/mL — ABNORMAL HIGH (ref 0.0–100.0)

## 2024-03-22 MED ORDER — IRBESARTAN 75 MG PO TABS
75.0000 mg | ORAL_TABLET | Freq: Every day | ORAL | Status: DC
  Administered 2024-03-22: 75 mg via ORAL
  Filled 2024-03-22 (×2): qty 1

## 2024-03-22 MED ORDER — DAPAGLIFLOZIN PROPANEDIOL 10 MG PO TABS
10.0000 mg | ORAL_TABLET | Freq: Every day | ORAL | Status: DC
  Administered 2024-03-22 – 2024-03-23 (×2): 10 mg via ORAL
  Filled 2024-03-22 (×2): qty 1

## 2024-03-22 MED ORDER — METOPROLOL TARTRATE 25 MG PO TABS
25.0000 mg | ORAL_TABLET | Freq: Two times a day (BID) | ORAL | Status: DC
  Administered 2024-03-22 – 2024-03-23 (×3): 25 mg via ORAL
  Filled 2024-03-22 (×3): qty 1

## 2024-03-22 MED ORDER — FUROSEMIDE 20 MG PO TABS
20.0000 mg | ORAL_TABLET | Freq: Every day | ORAL | Status: DC
  Administered 2024-03-22 – 2024-03-23 (×2): 20 mg via ORAL
  Filled 2024-03-22 (×2): qty 1

## 2024-03-22 NOTE — Progress Notes (Signed)
 PROGRESS NOTE    Deborah Cooley  ZOX:[id number] DOB: 09/25/1956 DOA: 03/20/2024 PCP: Gwenevere Lent, FNP   Brief Narrative:    Deborah Cooley is a 68 year old female with extensive history of HTN, costochondritis, angioimmunoblastic lymphoma, chronic anemia of neoplastic disease presented with progressively worsening shortness of breath.  She appears to have some volume overload possibly related to heart failure with 2D echocardiogram pending.  She appears to have diuresed quite well and now appears euvolemic and has some mild creatinine elevation.  Further diuresis with Lasix  has been held at this point.  Assessment & Plan:   Principal Problem:   SOB (shortness of breath) Active Problems:   Chest pain   Essential hypertension, benign   Elevated brain natriuretic peptide (BNP) level   Tobacco use   Angioimmunoblastic lymphoma (HCC)   Anemia in neoplastic disease  Assessment and Plan:  SOB (shortness of breath) Acute respiratory distress, with dyspnea No history of COPD, CHF, PE or DVT -Currently nightly satting 97% on room air -Tachypneic with heart rate of 32, afebrile, not tachycardic -Continue as needed DuoNeb bronchodilators -BNP, with chest x-ray consistent with congestion -Ruling out acute heart failure - Reds clip score of 29% on 5/3, diuretic has been transitioned to oral route. - 2D echo demonstrating acute systolic CHF. - CT angiogram without signs of PE and noted to have some pleural effusions with atelectasis -GDMT has been initiated and cardiology service consulted.   Elevated brain natriuretic peptide (BNP) level - Patient presented with shortness of breath, elevated  - BNP 1311.0 >>> 939 - Excellent response to IV diuresis - Continue to follow daily weights - Low-sodium diet discussed with patient and family - 2D echo has demonstrated ejection fraction 35 to 40% (new diagnosis of systolic heart failure) - Farxiga, metoprolol, Avapro and low-dose  daily Lasix  initiated now that she is euvolemic. - Will follow response to medications and further recommendations by cardiology service.   Essential hypertension, benign - Mildly elevated blood pressure, not on any medication at home - Continue to follow vital signs - GDMT management for heart failure has been initiated utilizing beta-blocker, Avapro and Lasix .   Chest pain - Typical chest pain, intermittent upper chest area - No acute changes on EKG: Rate of 84, QTc 426, - Troponin 19, 17 - 2D echo demonstrating global hypokinesis -Telemetry without ischemic change - Cardiology service has been consulted - On today's examination patient reports no further chest discomfort.    Tobacco use - Chronic tobacco abuse -Patient has been counseled, offered NicoDerm patch Not interested in quitting at this point.  Hypokalemia - Continue to follow electrolytes and further replete as needed - Continue telemetry monitoring.  DVT prophylaxis:Heparin  Code Status: Full Family Communication: Daughter and husband at bedside 5/3 Disposition Plan:  Status is: Observation  The patient will require care spanning > 2 midnights and should be moved to inpatient because: Need for IV medications and further evaluation.   Consultants:  None  Procedures:  None  Antimicrobials:  None   Subjective: Good urine output appreciated; patient reports no chest pain and overall feeling better and breathing easier.  Son insomnia reported by family.  Objective: Vitals:   03/21/24 2307 03/22/24 0506 03/22/24 1217 03/22/24 1359  BP:  138/82 (!) 142/85   Pulse:  94 (!) 108   Resp:  20 18   Temp:  98.1 F (36.7 C) 97.7 F (36.5 C)   TempSrc:  Oral Oral   SpO2: 93% 95% 98% 96%  Weight:  56 kg    Height:        Intake/Output Summary (Last 24 hours) at 03/22/2024 1809 Last data filed at 03/22/2024 1647 Gross per 24 hour  Intake 720 ml  Output 2000 ml  Net -1280 ml   Filed Weights   03/20/24 1445  03/21/24 0342 03/22/24 0506  Weight: 56 kg 54.5 kg 56 kg    Examination: General exam: Alert, awake, oriented x 3; reports feeling better and breathing easier.  Still short winded with activity. Respiratory system: Decreased breath sounds at the bases; no frank crackles appreciated on exam.  No wheezing.  Good saturation on room air. Cardiovascular system:RRR.  No rubs, no gallops, no JVD, no murmurs. Gastrointestinal system: Abdomen is nondistended, soft and nontender. No organomegaly or masses felt. Normal bowel sounds heard. Central nervous system: No focal neurological deficits. Extremities: No C/C/E, +pedal pulses Skin: No petechiae. Psychiatry: Judgement and insight appear normal.    Data Reviewed: I have personally reviewed following labs and imaging studies  CBC: Recent Labs  Lab 03/20/24 1021  WBC 7.4  NEUTROABS 4.7  HGB 11.6*  HCT 36.0  MCV 86.1  PLT 166   Basic Metabolic Panel: Recent Labs  Lab 03/20/24 1021 03/20/24 1305 03/21/24 0514 03/22/24 0606  NA 132*  --  136 135  K 3.9  --  3.1* 4.6  CL 102  --  98 105  CO2 20*  --  25 21*  GLUCOSE 160*  --  238* 163*  BUN 14  --  18 17  CREATININE 0.89  --  1.12* 1.11*  CALCIUM 9.5  --  9.5 9.6  MG  --  2.0  --   --   PHOS  --  3.5  --   --    GFR: Estimated Creatinine Clearance: 38.4 mL/min (A) (by C-G formula based on SCr of 1.11 mg/dL (H)).  Liver Function Tests: Recent Labs  Lab 03/20/24 1021  AST 17  ALT 21  ALKPHOS 46  BILITOT 1.0  PROT 6.7  ALBUMIN 4.1   HbA1C: Recent Labs    03/20/24 1550  HGBA1C 6.3*   CBG: Recent Labs  Lab 03/21/24 0735 03/22/24 0740  GLUCAP 141* 150*   Lipid Profile: Recent Labs    03/20/24 1406  CHOL 204*  HDL 42  LDLCALC 141*  TRIG 103  CHOLHDL 4.9    Recent Results (from the past 240 hours)  Resp panel by RT-PCR (RSV, Flu A&B, Covid) Anterior Nasal Swab     Status: None   Collection Time: 03/20/24 10:46 AM   Specimen: Anterior Nasal Swab  Result  Value Ref Range Status   SARS Coronavirus 2 by RT PCR NEGATIVE NEGATIVE Final    Comment: (NOTE) SARS-CoV-2 target nucleic acids are NOT DETECTED.  The SARS-CoV-2 RNA is generally detectable in upper respiratory specimens during the acute phase of infection. The lowest concentration of SARS-CoV-2 viral copies this assay can detect is 138 copies/mL. A negative result does not preclude SARS-Cov-2 infection and should not be used as the sole basis for treatment or other patient management decisions. A negative result may occur with  improper specimen collection/handling, submission of specimen other than nasopharyngeal swab, presence of viral mutation(s) within the areas targeted by this assay, and inadequate number of viral copies(<138 copies/mL). A negative result must be combined with clinical observations, patient history, and epidemiological information. The expected result is Negative.  Fact Sheet for Patients:  BloggerCourse.com  Fact Sheet for Healthcare Providers:  SeriousBroker.it  This test is no t yet approved or cleared by the [place]  FDA and  has been authorized for detection and/or diagnosis of SARS-CoV-2 by FDA under an Emergency Use Authorization (EUA). This EUA will remain  in effect (meaning this test can be used) for the duration of the COVID-19 declaration under Section 564(b)(1) of the Act, 21 U.S.C.section 360bbb-3(b)(1), unless the authorization is terminated  or revoked sooner.       Influenza A by PCR NEGATIVE NEGATIVE Final   Influenza B by PCR NEGATIVE NEGATIVE Final    Comment: (NOTE) The Xpert Xpress SARS-CoV-2/FLU/RSV plus assay is intended as an aid in the diagnosis of influenza from Nasopharyngeal swab specimens and should not be used as a sole basis for treatment. Nasal washings and aspirates are unacceptable for Xpert Xpress SARS-CoV-2/FLU/RSV testing.  Fact Sheet for  Patients: BloggerCourse.com  Fact Sheet for Healthcare Providers: SeriousBroker.it  This test is not yet approved or cleared by the [place]  FDA and has been authorized for detection and/or diagnosis of SARS-CoV-2 by FDA under an Emergency Use Authorization (EUA). This EUA will remain in effect (meaning this test can be used) for the duration of the COVID-19 declaration under Section 564(b)(1) of the Act, 21 U.S.C. section 360bbb-3(b)(1), unless the authorization is terminated or revoked.     Resp Syncytial Virus by PCR NEGATIVE NEGATIVE Final    Comment: (NOTE) Fact Sheet for Patients: BloggerCourse.com  Fact Sheet for Healthcare Providers: SeriousBroker.it  This test is not yet approved or cleared by the [place]  FDA and has been authorized for detection and/or diagnosis of SARS-CoV-2 by FDA under an Emergency Use Authorization (EUA). This EUA will remain in effect (meaning this test can be used) for the duration of the COVID-19 declaration under Section 564(b)(1) of the Act, 21 U.S.C. section 360bbb-3(b)(1), unless the authorization is terminated or revoked.  Performed at [facility], [address], [place], [place] [address]     Radiology Studies: ECHOCARDIOGRAM COMPLETE Result Date: 03/21/2024    ECHOCARDIOGRAM REPORT   Patient Name:   Deborah Cooley Date of Exam: 03/21/2024 Medical Rec #:  [id number]        Height:       62.0 in Accession #:    [id number]       Weight:       120.1 lb Date of Birth:  1956-02-12        BSA:          1.539 m Patient Age:    67 years         BP:           138/72 mmHg Patient Gender: F                HR:           83 bpm. Exam Location:  [facility] Procedure: 2D Echo, Color Doppler and Cardiac Doppler (Both Spectral and Color            Flow Doppler were utilized during procedure). Indications:    R06.9 DOE  History:        Patient has prior  history of Echocardiogram examinations, most                 recent 04/30/2018. Risk Factors:Hypertension and Dyslipidemia.  Sonographer:    Sherline Distel Senior RDCS Referring Phys: [phone] SEYED A SHAHMEHDI IMPRESSIONS  1. Left ventricular ejection fraction, by estimation, is 35 to 40%. The left ventricle has moderately decreased function.  The left ventricle demonstrates global hypokinesis. Left ventricular diastolic parameters are indeterminate.  2. Right ventricular systolic function is normal. The right ventricular size is normal. Tricuspid regurgitation signal is inadequate for assessing PA pressure.  3. A small pericardial effusion is present. The pericardial effusion is circumferential.  4. The mitral valve is normal in structure. Trivial mitral valve regurgitation. No evidence of mitral stenosis.  5. The aortic valve is tricuspid. Aortic valve regurgitation is not visualized. No aortic stenosis is present.  6. The inferior vena cava is normal in size with greater than 50% respiratory variability, suggesting right atrial pressure of 3 mmHg. FINDINGS  Left Ventricle: Left ventricular ejection fraction, by estimation, is 35 to 40%. The left ventricle has moderately decreased function. The left ventricle demonstrates global hypokinesis. The left ventricular internal cavity size was normal in size. There is no left ventricular hypertrophy. Left ventricular diastolic parameters are indeterminate. Right Ventricle: The right ventricular size is normal. Right vetricular wall thickness was not well visualized. Right ventricular systolic function is normal. Tricuspid regurgitation signal is inadequate for assessing PA pressure. Left Atrium: Left atrial size was normal in size. Right Atrium: Right atrial size was normal in size. Pericardium: A small pericardial effusion is present. The pericardial effusion is circumferential. Mitral Valve: The mitral valve is normal in structure. Trivial mitral valve regurgitation. No evidence  of mitral valve stenosis. Tricuspid Valve: The tricuspid valve is normal in structure. Tricuspid valve regurgitation is not demonstrated. No evidence of tricuspid stenosis. Aortic Valve: The aortic valve is tricuspid. Aortic valve regurgitation is not visualized. No aortic stenosis is present. Aortic valve mean gradient measures 2.5 mmHg. Aortic valve peak gradient measures 6.0 mmHg. Aortic valve area, by VTI measures 1.37 cm. Pulmonic Valve: The pulmonic valve was not well visualized. Pulmonic valve regurgitation is not visualized. No evidence of pulmonic stenosis. Aorta: The aortic root and ascending aorta are structurally normal, with no evidence of dilitation. Venous: The inferior vena cava is normal in size with greater than 50% respiratory variability, suggesting right atrial pressure of 3 mmHg. IAS/Shunts: No atrial level shunt detected by color flow Doppler.  LEFT VENTRICLE PLAX 2D LVIDd:         4.40 cm      Diastology LVIDs:         3.50 cm      LV e' medial:    5.66 cm/s LV PW:         1.00 cm      LV E/e' medial:  10.7 LV IVS:        1.00 cm      LV e' lateral:   6.20 cm/s LVOT diam:     2.00 cm      LV E/e' lateral: 9.7 LV SV:         32 LV SV Index:   21 LVOT Area:     3.14 cm  LV Volumes (MOD) LV vol d, MOD A2C: 101.0 ml LV vol d, MOD A4C: 97.8 ml LV vol s, MOD A2C: 69.5 ml LV vol s, MOD A4C: 66.0 ml LV SV MOD A2C:     31.5 ml LV SV MOD A4C:     97.8 ml LV SV MOD BP:      31.1 ml RIGHT VENTRICLE RV S prime:     9.14 cm/s TAPSE (M-mode): 1.5 cm LEFT ATRIUM             Index        RIGHT ATRIUM  Index LA diam:        3.60 cm 2.34 cm/m   RA Area:     11.80 cm LA Vol (A2C):   38.0 ml 24.68 ml/m  RA Volume:   26.10 ml  16.95 ml/m LA Vol (A4C):   32.1 ml 20.85 ml/m LA Biplane Vol: 37.5 ml 24.36 ml/m  AORTIC VALVE AV Area (Vmax):    1.51 cm AV Area (Vmean):   1.95 cm AV Area (VTI):     1.37 cm AV Vmax:           122.16 cm/s AV Vmean:          71.380 cm/s AV VTI:            0.231 m AV Peak  Grad:      6.0 mmHg AV Mean Grad:      2.5 mmHg LVOT Vmax:         58.70 cm/s LVOT Vmean:        44.400 cm/s LVOT VTI:          0.101 m LVOT/AV VTI ratio: 0.44  AORTA Ao Root diam: 2.60 cm Ao Asc diam:  2.60 cm MITRAL VALVE MV Area (PHT): 6.07 cm    SHUNTS MV Decel Time: 125 msec    Systemic VTI:  0.10 m MV E velocity: 60.40 cm/s  Systemic Diam: 2.00 cm MV A velocity: 32.60 cm/s MV E/A ratio:  1.85 Armida Lander MD Electronically signed by Armida Lander MD Signature Date/Time: 03/21/2024/1:26:14 PM    Final      Scheduled Meds:  dapagliflozin propanediol  10 mg Oral Daily   furosemide   20 mg Oral Daily   heparin   5,000 Units Subcutaneous Q8H   irbesartan  75 mg Oral Daily   metoprolol tartrate  25 mg Oral BID   nicotine   21 mg Transdermal Daily   sodium chloride  flush  3 mL Intravenous Q12H   sodium chloride  flush  3 mL Intravenous Q12H     LOS: 0 days    Time spent: 50 minutes  Justina Oman, MD [facility]  If 7PM-7AM, please contact night-coverage [email or web address] 03/22/2024, 6:09 PM

## 2024-03-22 NOTE — Plan of Care (Signed)
  Problem: Health Behavior/Discharge Planning: Goal: Ability to manage health-related needs will improve Outcome: Progressing   Problem: Clinical Measurements: Goal: Ability to maintain clinical measurements within normal limits will improve Outcome: Progressing Goal: Will remain free from infection Outcome: Progressing Goal: Diagnostic test results will improve Outcome: Progressing Goal: Respiratory complications will improve Outcome: Progressing   Problem: Elimination: Goal: Will not experience complications related to bowel motility Outcome: Progressing Goal: Will not experience complications related to urinary retention Outcome: Progressing   Problem: Pain Managment: Goal: General experience of comfort will improve and/or be controlled Outcome: Progressing   Problem: Safety: Goal: Ability to remain free from injury will improve Outcome: Progressing   Problem: Skin Integrity: Goal: Risk for impaired skin integrity will decrease Outcome: Progressing

## 2024-03-23 ENCOUNTER — Telehealth: Payer: Self-pay | Admitting: Cardiology

## 2024-03-23 ENCOUNTER — Other Ambulatory Visit: Payer: Self-pay

## 2024-03-23 DIAGNOSIS — Z720 Tobacco use: Secondary | ICD-10-CM | POA: Diagnosis not present

## 2024-03-23 DIAGNOSIS — I5021 Acute systolic (congestive) heart failure: Secondary | ICD-10-CM | POA: Diagnosis not present

## 2024-03-23 DIAGNOSIS — Z79899 Other long term (current) drug therapy: Secondary | ICD-10-CM

## 2024-03-23 DIAGNOSIS — R0602 Shortness of breath: Secondary | ICD-10-CM | POA: Diagnosis not present

## 2024-03-23 LAB — BASIC METABOLIC PANEL WITH GFR
Anion gap: 8 (ref 5–15)
BUN: 18 mg/dL (ref 8–23)
CO2: 22 mmol/L (ref 22–32)
Calcium: 9.3 mg/dL (ref 8.9–10.3)
Chloride: 105 mmol/L (ref 98–111)
Creatinine, Ser: 1.23 mg/dL — ABNORMAL HIGH (ref 0.44–1.00)
GFR, Estimated: 48 mL/min — ABNORMAL LOW (ref >60)
Glucose, Bld: 138 mg/dL — ABNORMAL HIGH (ref 70–99)
Potassium: 3.8 mmol/L (ref 3.5–5.1)
Sodium: 135 mmol/L (ref 135–145)

## 2024-03-23 LAB — BRAIN NATRIURETIC PEPTIDE: B Natriuretic Peptide: 972 pg/mL — ABNORMAL HIGH (ref 0.0–100.0)

## 2024-03-23 LAB — GLUCOSE, CAPILLARY: Glucose-Capillary: 184 mg/dL — ABNORMAL HIGH (ref 70–99)

## 2024-03-23 MED ORDER — SACUBITRIL-VALSARTAN 24-26 MG PO TABS: 1.0000 | ORAL_TABLET | Freq: Two times a day (BID) | ORAL | 2 refills | Status: DC

## 2024-03-23 MED ORDER — SACUBITRIL-VALSARTAN 24-26 MG PO TABS
1.0000 | ORAL_TABLET | Freq: Two times a day (BID) | ORAL | Status: DC
  Administered 2024-03-23: 1 via ORAL
  Filled 2024-03-23: qty 1

## 2024-03-23 MED ORDER — FUROSEMIDE 20 MG PO TABS: 20.0000 mg | ORAL_TABLET | Freq: Every day | ORAL | 2 refills | Status: DC

## 2024-03-23 MED ORDER — DAPAGLIFLOZIN PROPANEDIOL 10 MG PO TABS: 10.0000 mg | ORAL_TABLET | Freq: Every day | ORAL | 2 refills | Status: DC

## 2024-03-23 MED ORDER — METOPROLOL TARTRATE 25 MG PO TABS: 25.0000 mg | ORAL_TABLET | Freq: Two times a day (BID) | ORAL | 2 refills | Status: DC

## 2024-03-23 NOTE — Telephone Encounter (Signed)
 Pt c/o medication issue:  1. Name of Medication:   dapagliflozin propanediol (FARXIGA) 10 MG TABS tablet  sacubitril-valsartan (ENTRESTO) 24-26 MG   2. How are you currently taking this medication (dosage and times per day)?   Not taking yet  3. Are you having a reaction (difficulty breathing--STAT)?   4. What is your medication issue?    Daughter stated patient cannot afford these medications and wants to get generic versions or alternate medications.  Daughter stated can call patient at [phone].

## 2024-03-23 NOTE — Discharge Summary (Signed)
 Physician Discharge Summary  [name]:[id number] DOB: 03-23-1956 DOA: 03/20/2024  PCP: Gwenevere Lent, FNP  Admit date: 03/20/2024  Discharge date: 03/23/2024  Admitted From:Home  Disposition:  Home  Recommendations for Outpatient Follow-up:  Follow up with PCP in 1-2 weeks Follow-up BMP in 2 weeks Continue on medications to include Lasix , metoprolol, Farxiga, and Entresto as prescribed Follow-up with cardiology as scheduled in 2 weeks with follow-up BMP as well.  Appointment scheduled 5/20 2:30 PM.  Home Health: None  Equipment/Devices: None  Discharge Condition:Stable  CODE STATUS: Full  Diet recommendation: Heart Healthy  Brief/Interim Summary: Deborah Cooley is a 68 year old female with extensive history of HTN, costochondritis, angioimmunoblastic lymphoma, chronic anemia of neoplastic disease presented with progressively worsening shortness of breath. She appears to have some volume overload possibly related to heart failure and 2D echocardiogram demonstrates some new systolic dysfunction with LVEF 35-40%.  She has diuresed adequately and will transition to oral medications as noted above and follow-up with cardiology outpatient.  No other acute events or concerns noted.  Discharge Diagnoses:  Principal Problem:   SOB (shortness of breath) Active Problems:   Chest pain   Essential hypertension, benign   Elevated brain natriuretic peptide (BNP) level   Tobacco use   Angioimmunoblastic lymphoma (HCC)   Anemia in neoplastic disease  Principal discharge diagnosis: Acute dyspnea secondary to new onset decompensated systolic CHF.  Discharge Instructions  Discharge Instructions     Diet - low sodium heart healthy   Complete by: As directed    Increase activity slowly   Complete by: As directed       Allergies as of 03/23/2024       Reactions   Ambien  [zolpidem ] Other (See Comments)    Confusion, lightheadedness, dizzy, bad dreams         Medication List     STOP taking these medications    guaiFENesin  600 MG 12 hr tablet Commonly known as: MUCINEX        TAKE these medications    dapagliflozin propanediol 10 MG Tabs tablet Commonly known as: FARXIGA Take 1 tablet (10 mg total) by mouth daily. Start taking on: Mar 24, 2024   furosemide  20 MG tablet Commonly known as: LASIX  Take 1 tablet (20 mg total) by mouth daily. Start taking on: Mar 24, 2024   metoprolol tartrate 25 MG tablet Commonly known as: LOPRESSOR Take 1 tablet (25 mg total) by mouth 2 (two) times daily.   sacubitril-valsartan 24-26 MG Commonly known as: ENTRESTO Take 1 tablet by mouth 2 (two) times daily.        Follow-up Information     Furth, Cadence H, PA-C Follow up.   Specialty: Cardiology Why: [facility] - [facility] location within [facility] - Tuesday Apr 07, 2024 at 2:30 PM. Arrive 15 minutes prior to appointment to check in. Contact information: [address] [place] [address] [phone]         [facility] Follow up.   Why: Please come to the front entrance at [facility] during business hours to check in for bloodwork (BMET) on May 19th (day before your cardiology appointment). You do not need to be fasting. Contact information: [address] [place]   [id number] [phone]               Allergies  Allergen Reactions   Ambien  [Zolpidem ] Other (See Comments)     Confusion, lightheadedness, dizzy, bad dreams    Consultations: Cardiology  Procedures/Studies: ECHOCARDIOGRAM COMPLETE Result Date: 03/21/2024    ECHOCARDIOGRAM REPORT   Patient Name:   Deborah Cooley Date of Exam: 03/21/2024 Medical Rec #:  [id number]        Height:       62.0 in Accession #:    [id number]       Weight:       120.1 lb Date of Birth:  1956-05-09        BSA:          1.539 m Patient Age:    67 years         BP:           138/72 mmHg Patient Gender: F                HR:            83 bpm. Exam Location:  [facility] Procedure: 2D Echo, Color Doppler and Cardiac Doppler (Both Spectral and Color            Flow Doppler were utilized during procedure). Indications:    R06.9 DOE  History:        Patient has prior history of Echocardiogram examinations, most                 recent 04/30/2018. Risk Factors:Hypertension and Dyslipidemia.  Sonographer:    Sherline Distel Senior RDCS Referring Phys: [phone] SEYED A SHAHMEHDI IMPRESSIONS  1. Left ventricular ejection fraction, by estimation, is 35 to 40%. The left ventricle has moderately decreased function. The left ventricle demonstrates global hypokinesis. Left ventricular diastolic parameters are indeterminate.  2. Right ventricular systolic function is normal. The right ventricular size is normal. Tricuspid regurgitation signal is inadequate for assessing PA pressure.  3. A small pericardial effusion is present. The pericardial effusion is circumferential.  4. The mitral valve is normal in structure. Trivial mitral valve regurgitation. No evidence of mitral stenosis.  5. The aortic valve is tricuspid. Aortic valve regurgitation is not visualized. No aortic stenosis is present.  6. The inferior vena cava is normal in size with greater than 50% respiratory variability, suggesting right atrial pressure of 3 mmHg. FINDINGS  Left Ventricle: Left ventricular ejection fraction, by estimation, is 35 to 40%. The left ventricle has moderately decreased function. The left ventricle demonstrates global hypokinesis. The left ventricular internal cavity size was normal in size. There is no left ventricular hypertrophy. Left ventricular diastolic parameters are indeterminate. Right Ventricle: The right ventricular size is normal. Right vetricular wall thickness was not well visualized. Right ventricular systolic function is normal. Tricuspid regurgitation signal is inadequate for assessing PA pressure. Left Atrium: Left atrial size was normal in size. Right Atrium:  Right atrial size was normal in size. Pericardium: A small pericardial effusion is present. The pericardial effusion is circumferential. Mitral Valve: The mitral valve is normal in structure. Trivial mitral valve regurgitation. No evidence of mitral valve stenosis. Tricuspid Valve: The tricuspid valve is normal in structure. Tricuspid valve regurgitation is not demonstrated. No evidence of tricuspid stenosis. Aortic Valve: The aortic valve is tricuspid. Aortic valve regurgitation is not visualized. No aortic stenosis is present. Aortic valve mean gradient measures 2.5 mmHg. Aortic valve peak gradient measures 6.0 mmHg. Aortic valve area, by VTI measures 1.37 cm. Pulmonic Valve: The pulmonic valve was not well visualized. Pulmonic valve regurgitation is not visualized. No evidence of pulmonic stenosis. Aorta: The aortic root and ascending aorta are structurally normal, with no evidence of dilitation. Venous: The inferior vena cava is  normal in size with greater than 50% respiratory variability, suggesting right atrial pressure of 3 mmHg. IAS/Shunts: No atrial level shunt detected by color flow Doppler.  LEFT VENTRICLE PLAX 2D LVIDd:         4.40 cm      Diastology LVIDs:         3.50 cm      LV e' medial:    5.66 cm/s LV PW:         1.00 cm      LV E/e' medial:  10.7 LV IVS:        1.00 cm      LV e' lateral:   6.20 cm/s LVOT diam:     2.00 cm      LV E/e' lateral: 9.7 LV SV:         32 LV SV Index:   21 LVOT Area:     3.14 cm  LV Volumes (MOD) LV vol d, MOD A2C: 101.0 ml LV vol d, MOD A4C: 97.8 ml LV vol s, MOD A2C: 69.5 ml LV vol s, MOD A4C: 66.0 ml LV SV MOD A2C:     31.5 ml LV SV MOD A4C:     97.8 ml LV SV MOD BP:      31.1 ml RIGHT VENTRICLE RV S prime:     9.14 cm/s TAPSE (M-mode): 1.5 cm LEFT ATRIUM             Index        RIGHT ATRIUM           Index LA diam:        3.60 cm 2.34 cm/m   RA Area:     11.80 cm LA Vol (A2C):   38.0 ml 24.68 ml/m  RA Volume:   26.10 ml  16.95 ml/m LA Vol (A4C):   32.1 ml  20.85 ml/m LA Biplane Vol: 37.5 ml 24.36 ml/m  AORTIC VALVE AV Area (Vmax):    1.51 cm AV Area (Vmean):   1.95 cm AV Area (VTI):     1.37 cm AV Vmax:           122.16 cm/s AV Vmean:          71.380 cm/s AV VTI:            0.231 m AV Peak Grad:      6.0 mmHg AV Mean Grad:      2.5 mmHg LVOT Vmax:         58.70 cm/s LVOT Vmean:        44.400 cm/s LVOT VTI:          0.101 m LVOT/AV VTI ratio: 0.44  AORTA Ao Root diam: 2.60 cm Ao Asc diam:  2.60 cm MITRAL VALVE MV Area (PHT): 6.07 cm    SHUNTS MV Decel Time: 125 msec    Systemic VTI:  0.10 m MV E velocity: 60.40 cm/s  Systemic Diam: 2.00 cm MV A velocity: 32.60 cm/s MV E/A ratio:  1.85 Armida Lander MD Electronically signed by Armida Lander MD Signature Date/Time: 03/21/2024/1:26:14 PM    Final    CT Angio Chest Pulmonary Embolism (PE) W or WO Contrast Result Date: 03/20/2024 CLINICAL DATA:  Concern for pulmonary embolism. EXAM: CT ANGIOGRAPHY CHEST WITH CONTRAST TECHNIQUE: Multidetector CT imaging of the chest was performed using the standard protocol during bolus administration of intravenous contrast. Multiplanar CT image reconstructions and MIPs were obtained to evaluate the vascular anatomy. RADIATION DOSE REDUCTION: This exam  was performed according to the departmental dose-optimization program which includes automated exposure control, adjustment of the mA and/or kV according to patient size and/or use of iterative reconstruction technique. CONTRAST:  75mL OMNIPAQUE  IOHEXOL  350 MG/ML SOLN COMPARISON:  CT chest abdomen pelvis dated 04/01/2020. FINDINGS: Cardiovascular: There is no cardiomegaly or pericardial effusion. Moderate atherosclerotic calcification of the aorta. No aneurysmal dilatation or dissection. No pulmonary artery embolus identified. Mediastinum/Nodes: No hilar or mediastinal adenopathy. The esophagus is grossly unremarkable. No mediastinal fluid collection. Lungs/Pleura: Small bilateral pleural effusions with partial compressive  atelectasis of the lower lobes. No pneumothorax. The central airways are patent. Upper Abdomen: No acute abnormality. Musculoskeletal: No chest wall abnormality. No acute or significant osseous findings. Review of the MIP images confirms the above findings. IMPRESSION: 1. No CT evidence of pulmonary embolism. 2. Small bilateral pleural effusions with partial compressive atelectasis of the lower lobes. 3.  Aortic Atherosclerosis (ICD10-I70.0). Electronically Signed   By: Angus Bark M.D.   On: 03/20/2024 16:45   DG Chest Portable 1 View Result Date: 03/20/2024 CLINICAL DATA:  Shortness of breath for 3 weeks. EXAM: PORTABLE CHEST 1 VIEW COMPARISON:  May 14, 2018. FINDINGS: Stable cardiomediastinal silhouette. Mild central pulmonary vascular congestion is noted with probable bilateral pulmonary edema and small pleural effusions. Bony thorax is unremarkable. IMPRESSION: Mild central pulmonary vascular congestion is noted with probable bilateral pulmonary edema and small pleural effusions. Electronically Signed   By: Rosalene Colon M.D.   On: 03/20/2024 11:30     Discharge Exam: Vitals:   03/23/24 0538 03/23/24 0919  BP: 121/63 (!) 112/58  Pulse: 63 67  Resp: 20   Temp: 98.2 F (36.8 C)   SpO2: 96% 97%   Vitals:   03/22/24 2110 03/22/24 2115 03/23/24 0538 03/23/24 0919  BP: 111/79  121/63 (!) 112/58  Pulse:   63 67  Resp: 18  20   Temp: 97.6 F (36.4 C)  98.2 F (36.8 C)   TempSrc: Oral  Oral   SpO2: 97% 97% 96% 97%  Weight:   53.5 kg   Height:        General: Pt is alert, awake, not in acute distress Cardiovascular: RRR, S1/S2 +, no rubs, no gallops Respiratory: CTA bilaterally, no wheezing, no rhonchi Abdominal: Soft, NT, ND, bowel sounds + Extremities: no edema, no cyanosis    The results of significant diagnostics from this hospitalization (including imaging, microbiology, ancillary and laboratory) are listed below for reference.     Microbiology: Recent Results (from  the past 240 hours)  Resp panel by RT-PCR (RSV, Flu A&B, Covid) Anterior Nasal Swab     Status: None   Collection Time: 03/20/24 10:46 AM   Specimen: Anterior Nasal Swab  Result Value Ref Range Status   SARS Coronavirus 2 by RT PCR NEGATIVE NEGATIVE Final    Comment: (NOTE) SARS-CoV-2 target nucleic acids are NOT DETECTED.  The SARS-CoV-2 RNA is generally detectable in upper respiratory specimens during the acute phase of infection. The lowest concentration of SARS-CoV-2 viral copies this assay can detect is 138 copies/mL. A negative result does not preclude SARS-Cov-2 infection and should not be used as the sole basis for treatment or other patient management decisions. A negative result may occur with  improper specimen collection/handling, submission of specimen other than nasopharyngeal swab, presence of viral mutation(s) within the areas targeted by this assay, and inadequate number of viral copies(<138 copies/mL). A negative result must be combined with clinical observations, patient history, and epidemiological information.  The expected result is Negative.  Fact Sheet for Patients:  BloggerCourse.com  Fact Sheet for Healthcare Providers:  SeriousBroker.it  This test is no t yet approved or cleared by the [place]  FDA and  has been authorized for detection and/or diagnosis of SARS-CoV-2 by FDA under an Emergency Use Authorization (EUA). This EUA will remain  in effect (meaning this test can be used) for the duration of the COVID-19 declaration under Section 564(b)(1) of the Act, 21 U.S.C.section 360bbb-3(b)(1), unless the authorization is terminated  or revoked sooner.       Influenza A by PCR NEGATIVE NEGATIVE Final   Influenza B by PCR NEGATIVE NEGATIVE Final    Comment: (NOTE) The Xpert Xpress SARS-CoV-2/FLU/RSV plus assay is intended as an aid in the diagnosis of influenza from Nasopharyngeal swab specimens  and should not be used as a sole basis for treatment. Nasal washings and aspirates are unacceptable for Xpert Xpress SARS-CoV-2/FLU/RSV testing.  Fact Sheet for Patients: BloggerCourse.com  Fact Sheet for Healthcare Providers: SeriousBroker.it  This test is not yet approved or cleared by the [place]  FDA and has been authorized for detection and/or diagnosis of SARS-CoV-2 by FDA under an Emergency Use Authorization (EUA). This EUA will remain in effect (meaning this test can be used) for the duration of the COVID-19 declaration under Section 564(b)(1) of the Act, 21 U.S.C. section 360bbb-3(b)(1), unless the authorization is terminated or revoked.     Resp Syncytial Virus by PCR NEGATIVE NEGATIVE Final    Comment: (NOTE) Fact Sheet for Patients: BloggerCourse.com  Fact Sheet for Healthcare Providers: SeriousBroker.it  This test is not yet approved or cleared by the [place]  FDA and has been authorized for detection and/or diagnosis of SARS-CoV-2 by FDA under an Emergency Use Authorization (EUA). This EUA will remain in effect (meaning this test can be used) for the duration of the COVID-19 declaration under Section 564(b)(1) of the Act, 21 U.S.C. section 360bbb-3(b)(1), unless the authorization is terminated or revoked.  Performed at [facility], [address] [place], [place] [address]      Labs: BNP (last 3 results) Recent Labs    03/21/24 0514 03/22/24 0606 03/23/24 0442  BNP 939.0* 1,296.0* 972.0*   Basic Metabolic Panel: Recent Labs  Lab 03/20/24 1021 03/20/24 1305 03/21/24 0514 03/22/24 0606 03/23/24 0442  NA 132*  --  136 135 135  K 3.9  --  3.1* 4.6 3.8  CL 102  --  98 105 105  CO2 20*  --  25 21* 22  GLUCOSE 160*  --  238* 163* 138*  BUN 14  --  18 17 18   CREATININE 0.89  --  1.12* 1.11* 1.23*  CALCIUM 9.5  --  9.5 9.6 9.3  MG  --  2.0   --   --   --   PHOS  --  3.5  --   --   --    Liver Function Tests: Recent Labs  Lab 03/20/24 1021  AST 17  ALT 21  ALKPHOS 46  BILITOT 1.0  PROT 6.7  ALBUMIN 4.1   No results for input(s): "LIPASE", "AMYLASE" in the last 168 hours. No results for input(s): "AMMONIA" in the last 168 hours. CBC: Recent Labs  Lab 03/20/24 1021  WBC 7.4  NEUTROABS 4.7  HGB 11.6*  HCT 36.0  MCV 86.1  PLT 166   Cardiac Enzymes: No results for input(s): "CKTOTAL", "CKMB", "CKMBINDEX", "TROPONINI" in the last 168 hours. BNP: Invalid input(s): "POCBNP" CBG: Recent  Labs  Lab 03/21/24 0735 03/22/24 0740 03/23/24 0743  GLUCAP 141* 150* 184*   D-Dimer Recent Labs    03/20/24 1115  DDIMER 0.56*   Hgb A1c Recent Labs    03/20/24 1550  HGBA1C 6.3*   Lipid Profile Recent Labs    03/20/24 1406  CHOL 204*  HDL 42  LDLCALC 141*  TRIG 103  CHOLHDL 4.9   Thyroid function studies No results for input(s): "TSH", "T4TOTAL", "T3FREE", "THYROIDAB" in the last 72 hours.  Invalid input(s): "FREET3" Anemia work up No results for input(s): "VITAMINB12", "FOLATE", "FERRITIN", "TIBC", "IRON", "RETICCTPCT" in the last 72 hours. Urinalysis    Component Value Date/Time   COLORURINE AMBER (A) 04/18/2018 0552   APPEARANCEUR HAZY (A) 04/18/2018 0552   LABSPEC 1.020 04/18/2018 0552   PHURINE 5.0 04/18/2018 0552   GLUCOSEU NEGATIVE 04/18/2018 0552   HGBUR NEGATIVE 04/18/2018 0552   BILIRUBINUR NEGATIVE 04/18/2018 0552   KETONESUR 5 (A) 04/18/2018 0552   PROTEINUR 30 (A) 04/18/2018 0552   NITRITE NEGATIVE 04/18/2018 0552   LEUKOCYTESUR NEGATIVE 04/18/2018 0552   Sepsis Labs Recent Labs  Lab 03/20/24 1021  WBC 7.4   Microbiology Recent Results (from the past 240 hours)  Resp panel by RT-PCR (RSV, Flu A&B, Covid) Anterior Nasal Swab     Status: None   Collection Time: 03/20/24 10:46 AM   Specimen: Anterior Nasal Swab  Result Value Ref Range Status   SARS Coronavirus 2 by RT PCR  NEGATIVE NEGATIVE Final    Comment: (NOTE) SARS-CoV-2 target nucleic acids are NOT DETECTED.  The SARS-CoV-2 RNA is generally detectable in upper respiratory specimens during the acute phase of infection. The lowest concentration of SARS-CoV-2 viral copies this assay can detect is 138 copies/mL. A negative result does not preclude SARS-Cov-2 infection and should not be used as the sole basis for treatment or other patient management decisions. A negative result may occur with  improper specimen collection/handling, submission of specimen other than nasopharyngeal swab, presence of viral mutation(s) within the areas targeted by this assay, and inadequate number of viral copies(<138 copies/mL). A negative result must be combined with clinical observations, patient history, and epidemiological information. The expected result is Negative.  Fact Sheet for Patients:  BloggerCourse.com  Fact Sheet for Healthcare Providers:  SeriousBroker.it  This test is no t yet approved or cleared by the [place]  FDA and  has been authorized for detection and/or diagnosis of SARS-CoV-2 by FDA under an Emergency Use Authorization (EUA). This EUA will remain  in effect (meaning this test can be used) for the duration of the COVID-19 declaration under Section 564(b)(1) of the Act, 21 U.S.C.section 360bbb-3(b)(1), unless the authorization is terminated  or revoked sooner.       Influenza A by PCR NEGATIVE NEGATIVE Final   Influenza B by PCR NEGATIVE NEGATIVE Final    Comment: (NOTE) The Xpert Xpress SARS-CoV-2/FLU/RSV plus assay is intended as an aid in the diagnosis of influenza from Nasopharyngeal swab specimens and should not be used as a sole basis for treatment. Nasal washings and aspirates are unacceptable for Xpert Xpress SARS-CoV-2/FLU/RSV testing.  Fact Sheet for Patients: BloggerCourse.com  Fact Sheet for  Healthcare Providers: SeriousBroker.it  This test is not yet approved or cleared by the [place]  FDA and has been authorized for detection and/or diagnosis of SARS-CoV-2 by FDA under an Emergency Use Authorization (EUA). This EUA will remain in effect (meaning this test can be used) for the duration of the COVID-19 declaration under Section  564(b)(1) of the Act, 21 U.S.C. section 360bbb-3(b)(1), unless the authorization is terminated or revoked.     Resp Syncytial Virus by PCR NEGATIVE NEGATIVE Final    Comment: (NOTE) Fact Sheet for Patients: BloggerCourse.com  Fact Sheet for Healthcare Providers: SeriousBroker.it  This test is not yet approved or cleared by the [place]  FDA and has been authorized for detection and/or diagnosis of SARS-CoV-2 by FDA under an Emergency Use Authorization (EUA). This EUA will remain in effect (meaning this test can be used) for the duration of the COVID-19 declaration under Section 564(b)(1) of the Act, 21 U.S.C. section 360bbb-3(b)(1), unless the authorization is terminated or revoked.  Performed at [facility], [address] [place], [place] [address]      Time coordinating discharge: 35 minutes  SIGNED:   Cornelius Dill, DO [facility] 03/23/2024, 9:43 AM  If 7PM-7AM, please contact night-coverage [email or web address]

## 2024-03-23 NOTE — Consult Note (Signed)
 CARDIOLOGY CONSULT NOTE    Patient ID: Deborah Cooley; [id number]; 1956/08/21   Admit date: 03/20/2024 Date of Consult: 03/23/2024  Primary Care Provider: Gwenevere Lent, FNP Primary Cardiologist:  Primary Electrophysiologist:     Patient Profile:   Deborah Cooley is a 68 y.o. female with a hx of HTN, angioimmunoblastic lymphoma who is being seen today for the evaluation of new onset systolic HF at the request of Dr Mason Sole .  History of Present Illness:   Ms. Bolton is a 68 year old F known to have HTN, angioimmunoblastic lymphoma presented to the ER with progressive DOE and chest discomfort. BNP was 1311.  CT angio chest was obtained during this admission that showed no evidence of pulmonary embolism but small bilateral pleural effusions with partial compressive atelectasis of the lower lobes. Chest x-ray showed pulmonary vascular congestion and small bilateral pleural effusions.  Patient was diuresed adequately and currently on p.o. diuretics.  Echocardiogram obtained this admission showed LVEF 35 to 40% (new drop in LVEF), normal RV function, CVP 3 mmHg and no valvular heart disease.  Currently chest pain-free after diuresis.  No shortness of breath.  She is able to walk with no symptoms of SOB or chest pain.  Past Medical History:  Diagnosis Date   angioimmunoblastic lymphoma dx'd 03/2018   BCC (basal cell carcinoma of skin) 05/02/2023   left nasal crease - schedule for Mohs   BCC (basal cell carcinoma), face 05/02/2023   mid tip of nose - schedule for Mohs   Bilateral pleural effusion 04/17/2018   Endometriosis    Essential hypertension, benign    Family history of adverse reaction to anesthesia    "sister stops breathing I think" (04/18/2018)   Heart murmur    Mixed hyperlipidemia    Pneumonia 04/17/2018    Past Surgical History:  Procedure Laterality Date   ABDOMINAL HYSTERECTOMY  1970s/1980s   for endometriosis   Arthroscopic right shoulder surgery      AXILLARY LYMPH NODE BIOPSY Left 04/21/2018   Procedure: LEFT AXILLARY LYMPH NODE BIOPSY;  Surgeon: Derral Flick, MD;  Location: [facility] OR;  Service: General;  Laterality: Left;   CARPAL TUNNEL RELEASE Right    GANGLION CYST EXCISION Right    IR IMAGING GUIDED PORT INSERTION  05/07/2018   IR REMOVAL TUN ACCESS W/ PORT W/O FL MOD SED  12/23/2018   MULTIPLE TOOTH EXTRACTIONS     OOPHORECTOMY  1997   PARTIAL HYSTERECTOMY  1984   Right hand surgery     SHOULDER OPEN ROTATOR CUFF REPAIR Right    VESICOVAGINAL FISTULA CLOSURE W/ TAH         Inpatient Medications: Scheduled Meds:  dapagliflozin propanediol  10 mg Oral Daily   furosemide   20 mg Oral Daily   heparin   5,000 Units Subcutaneous Q8H   metoprolol tartrate  25 mg Oral BID   nicotine   21 mg Transdermal Daily   sacubitril-valsartan  1 tablet Oral BID   sodium chloride  flush  3 mL Intravenous Q12H   sodium chloride  flush  3 mL Intravenous Q12H   Continuous Infusions:  PRN Meds: acetaminophen  **OR** acetaminophen , ALPRAZolam, hydrALAZINE, HYDROmorphone  (DILAUDID ) injection, ipratropium, levalbuterol , ondansetron  **OR** ondansetron  (ZOFRAN ) IV, oxyCODONE, senna-docusate, sodium phosphate , traZODone   Allergies:    Allergies  Allergen Reactions   Ambien  [Zolpidem ] Other (See Comments)     Confusion, lightheadedness, dizzy, bad dreams    Social History:   Social History   Socioeconomic History   Marital status: Married  Spouse name: Not on file   Number of children: Not on file   Years of education: Not on file   Highest education level: Not on file  Occupational History   Occupation: Designer, industrial/product    Comment: works at [employer]  Tobacco Use   Smoking status: Every Day    Current packs/day: 1.50    Average packs/day: 1.5 packs/day for 46.0 years (69.0 ttl pk-yrs)    Types: Cigarettes    Passive exposure: Current   Smokeless tobacco: Never  Vaping Use   Vaping status: Never Used  Substance and Sexual  Activity   Alcohol use: Not Currently    Comment: 04/18/2018 "quit years ago"   Drug use: Not Currently    Comment: "back in my 70s"   Sexual activity: Not Currently  Other Topics Concern   Not on file  Social History Narrative   ** Merged History Encounter **       Patient has one son whom is healthy.   Has a live in boyfriend.   Social Drivers of Corporate investment banker Strain: Not on file  Food Insecurity: No Food Insecurity (03/20/2024)   Hunger Vital Sign    Worried About Running Out of Food in the Last Year: Never true    Ran Out of Food in the Last Year: Never true  Transportation Needs: No Transportation Needs (03/20/2024)   PRAPARE - Administrator, Civil Service (Medical): No    Lack of Transportation (Non-Medical): No  Physical Activity: Not on file  Stress: Not on file  Social Connections: Moderately Integrated (03/20/2024)   Social Connection and Isolation Panel [NHANES]    Frequency of Communication with Friends and Family: More than three times a week    Frequency of Social Gatherings with Friends and Family: More than three times a week    Attends Religious Services: More than 4 times per year    Active Member of [employer] or Organizations: No    Attends Banker Meetings: Never    Marital Status: Married  Catering manager Violence: Not At Risk (03/20/2024)   Humiliation, Afraid, Rape, and Kick questionnaire    Fear of Current or Ex-Partner: No    Emotionally Abused: No    Physically Abused: No    Sexually Abused: No    Family History:    Family History  Problem Relation Age of Onset   Diabetes Mother        age 86   Heart attack Father        died age 25's   Hypertension Father    Cancer Sister        breast cancer diagonsed age 45 years   Diabetes Other    Heart disease Other        Female <55   Arthritis Other    Asthma Other    Hyperlipidemia Other        several siblings   Thyroid disease Sister        one sister with  thyroid disease     ROS:  Please see the history of present illness.  ROS  All other ROS reviewed and negative.     Physical Exam/Data:   Vitals:   03/22/24 1359 03/22/24 2110 03/22/24 2115 03/23/24 0538  BP:  111/79  121/63  Pulse:    63  Resp:  18  20  Temp:  97.6 F (36.4 C)  98.2 F (36.8 C)  TempSrc:  Oral  Oral  SpO2: 96% 97% 97% 96%  Weight:    53.5 kg  Height:        Intake/Output Summary (Last 24 hours) at 03/23/2024 0918 Last data filed at 03/23/2024 0500 Gross per 24 hour  Intake 600 ml  Output 2200 ml  Net -1600 ml   Filed Weights   03/21/24 0342 03/22/24 0506 03/23/24 0538  Weight: 54.5 kg 56 kg 53.5 kg   Body mass index is 21.57 kg/m.  General:  Well nourished, well developed, in no acute distress HEENT: normal Lymph: no adenopathy Neck: no JVD Endocrine:  No thryomegaly Vascular: No carotid bruits; FA pulses 2+ bilaterally without bruits  Cardiac:  normal S1, S2; RRR; no murmur  Lungs:  clear to auscultation bilaterally, no wheezing, rhonchi or rales  Abd: soft, nontender, no hepatomegaly  Ext: no edema Musculoskeletal:  No deformities, BUE and BLE strength normal and equal Skin: warm and dry  Neuro:  CNs 2-12 intact, no focal abnormalities noted Psych:  Normal affect   EKG:  The EKG was personally reviewed and demonstrates: NSR, LVH with repolarization normality, PVCs. Telemetry:  Telemetry was personally reviewed and demonstrates: NSR.  Relevant CV Studies:   Laboratory Data:  Chemistry Recent Labs  Lab 03/21/24 0514 03/22/24 0606 03/23/24 0442  NA 136 135 135  K 3.1* 4.6 3.8  CL 98 105 105  CO2 25 21* 22  GLUCOSE 238* 163* 138*  BUN 18 17 18   CREATININE 1.12* 1.11* 1.23*  CALCIUM 9.5 9.6 9.3  GFRNONAA 54* 54* 48*  ANIONGAP 13 9 8     Recent Labs  Lab 03/20/24 1021  PROT 6.7  ALBUMIN 4.1  AST 17  ALT 21  ALKPHOS 46  BILITOT 1.0   Hematology Recent Labs  Lab 03/20/24 1021  WBC 7.4  RBC 4.18  HGB 11.6*  HCT 36.0   MCV 86.1  MCH 27.8  MCHC 32.2  RDW 15.1  PLT 166   Cardiac EnzymesNo results for input(s): "TROPONINI" in the last 168 hours. No results for input(s): "TROPIPOC" in the last 168 hours.  BNP Recent Labs  Lab 03/21/24 0514 03/22/24 0606 03/23/24 0442  BNP 939.0* 1,296.0* 972.0*    DDimer  Recent Labs  Lab 03/20/24 1115  DDIMER 0.56*    Radiology/Studies:  ECHOCARDIOGRAM COMPLETE Result Date: 03/21/2024    ECHOCARDIOGRAM REPORT   Patient Name:   Torah Barile Date of Exam: 03/21/2024 Medical Rec #:  [id number]        Height:       62.0 in Accession #:    [id number]       Weight:       120.1 lb Date of Birth:  1956/11/14        BSA:          1.539 m Patient Age:    67 years         BP:           138/72 mmHg Patient Gender: F                HR:           83 bpm. Exam Location:  [facility] Procedure: 2D Echo, Color Doppler and Cardiac Doppler (Both Spectral and Color            Flow Doppler were utilized during procedure). Indications:    R06.9 DOE  History:        Patient has prior history of Echocardiogram examinations, most  recent 04/30/2018. Risk Factors:Hypertension and Dyslipidemia.  Sonographer:    Sherline Distel Senior RDCS Referring Phys: [phone] SEYED A SHAHMEHDI IMPRESSIONS  1. Left ventricular ejection fraction, by estimation, is 35 to 40%. The left ventricle has moderately decreased function. The left ventricle demonstrates global hypokinesis. Left ventricular diastolic parameters are indeterminate.  2. Right ventricular systolic function is normal. The right ventricular size is normal. Tricuspid regurgitation signal is inadequate for assessing PA pressure.  3. A small pericardial effusion is present. The pericardial effusion is circumferential.  4. The mitral valve is normal in structure. Trivial mitral valve regurgitation. No evidence of mitral stenosis.  5. The aortic valve is tricuspid. Aortic valve regurgitation is not visualized. No aortic stenosis is present.  6. The  inferior vena cava is normal in size with greater than 50% respiratory variability, suggesting right atrial pressure of 3 mmHg. FINDINGS  Left Ventricle: Left ventricular ejection fraction, by estimation, is 35 to 40%. The left ventricle has moderately decreased function. The left ventricle demonstrates global hypokinesis. The left ventricular internal cavity size was normal in size. There is no left ventricular hypertrophy. Left ventricular diastolic parameters are indeterminate. Right Ventricle: The right ventricular size is normal. Right vetricular wall thickness was not well visualized. Right ventricular systolic function is normal. Tricuspid regurgitation signal is inadequate for assessing PA pressure. Left Atrium: Left atrial size was normal in size. Right Atrium: Right atrial size was normal in size. Pericardium: A small pericardial effusion is present. The pericardial effusion is circumferential. Mitral Valve: The mitral valve is normal in structure. Trivial mitral valve regurgitation. No evidence of mitral valve stenosis. Tricuspid Valve: The tricuspid valve is normal in structure. Tricuspid valve regurgitation is not demonstrated. No evidence of tricuspid stenosis. Aortic Valve: The aortic valve is tricuspid. Aortic valve regurgitation is not visualized. No aortic stenosis is present. Aortic valve mean gradient measures 2.5 mmHg. Aortic valve peak gradient measures 6.0 mmHg. Aortic valve area, by VTI measures 1.37 cm. Pulmonic Valve: The pulmonic valve was not well visualized. Pulmonic valve regurgitation is not visualized. No evidence of pulmonic stenosis. Aorta: The aortic root and ascending aorta are structurally normal, with no evidence of dilitation. Venous: The inferior vena cava is normal in size with greater than 50% respiratory variability, suggesting right atrial pressure of 3 mmHg. IAS/Shunts: No atrial level shunt detected by color flow Doppler.  LEFT VENTRICLE PLAX 2D LVIDd:         4.40 cm       Diastology LVIDs:         3.50 cm      LV e' medial:    5.66 cm/s LV PW:         1.00 cm      LV E/e' medial:  10.7 LV IVS:        1.00 cm      LV e' lateral:   6.20 cm/s LVOT diam:     2.00 cm      LV E/e' lateral: 9.7 LV SV:         32 LV SV Index:   21 LVOT Area:     3.14 cm  LV Volumes (MOD) LV vol d, MOD A2C: 101.0 ml LV vol d, MOD A4C: 97.8 ml LV vol s, MOD A2C: 69.5 ml LV vol s, MOD A4C: 66.0 ml LV SV MOD A2C:     31.5 ml LV SV MOD A4C:     97.8 ml LV SV MOD BP:      31.1  ml RIGHT VENTRICLE RV S prime:     9.14 cm/s TAPSE (M-mode): 1.5 cm LEFT ATRIUM             Index        RIGHT ATRIUM           Index LA diam:        3.60 cm 2.34 cm/m   RA Area:     11.80 cm LA Vol (A2C):   38.0 ml 24.68 ml/m  RA Volume:   26.10 ml  16.95 ml/m LA Vol (A4C):   32.1 ml 20.85 ml/m LA Biplane Vol: 37.5 ml 24.36 ml/m  AORTIC VALVE AV Area (Vmax):    1.51 cm AV Area (Vmean):   1.95 cm AV Area (VTI):     1.37 cm AV Vmax:           122.16 cm/s AV Vmean:          71.380 cm/s AV VTI:            0.231 m AV Peak Grad:      6.0 mmHg AV Mean Grad:      2.5 mmHg LVOT Vmax:         58.70 cm/s LVOT Vmean:        44.400 cm/s LVOT VTI:          0.101 m LVOT/AV VTI ratio: 0.44  AORTA Ao Root diam: 2.60 cm Ao Asc diam:  2.60 cm MITRAL VALVE MV Area (PHT): 6.07 cm    SHUNTS MV Decel Time: 125 msec    Systemic VTI:  0.10 m MV E velocity: 60.40 cm/s  Systemic Diam: 2.00 cm MV A velocity: 32.60 cm/s MV E/A ratio:  1.85 Armida Lander MD Electronically signed by Armida Lander MD Signature Date/Time: 03/21/2024/1:26:14 PM    Final    CT Angio Chest Pulmonary Embolism (PE) W or WO Contrast Result Date: 03/20/2024 CLINICAL DATA:  Concern for pulmonary embolism. EXAM: CT ANGIOGRAPHY CHEST WITH CONTRAST TECHNIQUE: Multidetector CT imaging of the chest was performed using the standard protocol during bolus administration of intravenous contrast. Multiplanar CT image reconstructions and MIPs were obtained to evaluate the vascular anatomy.  RADIATION DOSE REDUCTION: This exam was performed according to the departmental dose-optimization program which includes automated exposure control, adjustment of the mA and/or kV according to patient size and/or use of iterative reconstruction technique. CONTRAST:  75mL OMNIPAQUE  IOHEXOL  350 MG/ML SOLN COMPARISON:  CT chest abdomen pelvis dated 04/01/2020. FINDINGS: Cardiovascular: There is no cardiomegaly or pericardial effusion. Moderate atherosclerotic calcification of the aorta. No aneurysmal dilatation or dissection. No pulmonary artery embolus identified. Mediastinum/Nodes: No hilar or mediastinal adenopathy. The esophagus is grossly unremarkable. No mediastinal fluid collection. Lungs/Pleura: Small bilateral pleural effusions with partial compressive atelectasis of the lower lobes. No pneumothorax. The central airways are patent. Upper Abdomen: No acute abnormality. Musculoskeletal: No chest wall abnormality. No acute or significant osseous findings. Review of the MIP images confirms the above findings. IMPRESSION: 1. No CT evidence of pulmonary embolism. 2. Small bilateral pleural effusions with partial compressive atelectasis of the lower lobes. 3.  Aortic Atherosclerosis (ICD10-I70.0). Electronically Signed   By: Angus Bark M.D.   On: 03/20/2024 16:45   DG Chest Portable 1 View Result Date: 03/20/2024 CLINICAL DATA:  Shortness of breath for 3 weeks. EXAM: PORTABLE CHEST 1 VIEW COMPARISON:  May 14, 2018. FINDINGS: Stable cardiomediastinal silhouette. Mild central pulmonary vascular congestion is noted with probable bilateral pulmonary edema and small pleural effusions. Bony thorax  is unremarkable. IMPRESSION: Mild central pulmonary vascular congestion is noted with probable bilateral pulmonary edema and small pleural effusions. Electronically Signed   By: Rosalene Colon M.D.   On: 03/20/2024 11:30    Assessment and Plan:   Acute systolic heart failure exacerbation: Presented with worsening  DOE and chest discomfort.imaging showed evidence of pulmonary vascular congestion and small bilateral pleural effusions.  Echo showed LVEF 35 to 40%, new drop in LVEF compared to prior echoes.  After adequate diuresis, DOE and chest discomfort completely resolved.  Continue p.o. Lasix  20 mg once daily, Farxiga 10 mg once daily, metoprolol tartrate 25 mg twice daily, switch irbesartan to Entresto 24-26 mg twice daily.  Will need to add spironolactone and uptitrate GDMT in the outpatient cardiology clinic after her serum creatinine normalizes.  Last chemotherapy was 6 years ago.  She will need noninvasive ischemia evaluation with CT cardiac outpatient.  She is agreeable to go to [facility] for [facility].  She is compensated and does not need any IV diuresis at this time.  Nicotine  abuse: On nicotine  patches now.  Counseling.   [facility] will sign off.   Medication Recommendations: P.o. Lasix  20 mg once daily, Farxiga 10 mg once daily, metoprolol titrate 25 mg twice daily, Entresto 24-26 mg twice daily. Other recommendations (labs, testing, etc): BMP in 2 weeks (prior to cardiology outpatient appointment). Follow up as an outpatient: Cardiology follow-up in 2 weeks.   For questions or updates, please contact [facility] Please consult [email or web address] for contact info under Cardiology/STEMI.   Signed, Asahd Can Priya Norrin Shreffler, MD 03/23/2024 9:18 AM

## 2024-03-23 NOTE — Telephone Encounter (Signed)
 Forward to med assist

## 2024-03-23 NOTE — Progress Notes (Signed)
 Went over discharge instructions w/ pt.

## 2024-03-24 ENCOUNTER — Other Ambulatory Visit (HOSPITAL_COMMUNITY): Payer: Self-pay

## 2024-03-24 ENCOUNTER — Telehealth: Payer: Self-pay | Admitting: Pharmacy Technician

## 2024-03-24 ENCOUNTER — Telehealth: Payer: Self-pay

## 2024-03-24 ENCOUNTER — Encounter (HOSPITAL_COMMUNITY): Payer: Self-pay | Admitting: Family Medicine

## 2024-03-24 MED ORDER — DAPAGLIFLOZIN PROPANEDIOL 10 MG PO TABS: 10.0000 mg | ORAL_TABLET | Freq: Every day | ORAL | 0 refills | Status: DC

## 2024-03-24 NOTE — Telephone Encounter (Signed)
 PAP: Patient assistance application for Entresto through [employer] has been mailed to pt's home address on file.   Patient does not have insurance

## 2024-03-24 NOTE — Telephone Encounter (Signed)
 Hi, I'm going to mail out the applications to see if she will get approved for medication assistance for the farxiga and entresto. The patient does not have insurance. With goodrx one is 700 something and the other prescription is 400 something. She has been out since she was discharged 03/23/24. She is asking if there are samples while the applications are in process. Thank you! I made another encounter for the applications

## 2024-03-24 NOTE — Progress Notes (Signed)
 Heart Failure Nurse Navigator Progress Note  PCP: Deborah Lent, FNP PCP-Cardiologist: Deborah Pointer, MD consult patient while @ [facility] referral from Dr. Mason Cooley. Admission Diagnosis: Acute Congestive Heart Failure , unspecified heart failure type [facility]) Admitted from: Home  *Patient was discharged from [facility] on 03/23/2024 before navigator could reach her in the hospital.  Navigator called patient via telephone on 03/24/2024 at home and confirmed identity using patient name and DOB.  Family member also on speaker phone during conversation.  SDOH updated and patient interview conducted including HF education.  New TOC scheduled and patient preferred Deborah Cooley location for follow-up appointment. Patient provided with appointment location and time details and she understood all information provided.  Immediate concern for patient is the cost of her new HF medication when she went to pick it up at the pharmacy.  She was unable to cover that cost so she did not get it.  Deborah Cooley is working on this for patient right now.  Hopefully there will be some patient assistance for this.   Presentation:   Deborah Cooley presented at [facility] for New Onset Systolic HF. Shortness of breath and intermittent chest pain for 3 weeks. Described an occasional clear to yellow-tinged sputum.  No hemoptysis.  Patient's family a the bedside endorses undiagnosed COPD as patient is a long term cigarette smoker.  She does not use supplemental oxygen or inhalers at baseline. Having to sleep upright at night. Chest x-ray showed pulmonary vascular congestion, bilateral pulmonary edema and small pleural effusions.  BNP 1,311.0   ECHO/ LVEF: 35-40% The left ventricle has moderately decreased function and global hypokinesis  Clinical Course:  Past Medical History:  Diagnosis Date   angioimmunoblastic lymphoma dx'd 03/2018   BCC (basal cell carcinoma of skin) 05/02/2023   left nasal crease - schedule for Mohs    BCC (basal cell carcinoma), face 05/02/2023   mid tip of nose - schedule for Mohs   Bilateral pleural effusion 04/17/2018   Endometriosis    Essential hypertension, benign    Family history of adverse reaction to anesthesia    "sister stops breathing I think" (04/18/2018)   Heart murmur    Mixed hyperlipidemia    Pneumonia 04/17/2018     Social History   Socioeconomic History   Marital status: Married    Spouse name: Not on file   Number of children: Not on file   Years of education: Not on file   Highest education level: Not on file  Occupational History   Occupation: Designer, industrial/product    Comment: works at [employer]  Tobacco Use   Smoking status: Every Day    Current packs/day: 1.50    Average packs/day: 1.5 packs/day for 46.0 years (69.0 ttl pk-yrs)    Types: Cigarettes    Passive exposure: Current   Smokeless tobacco: Never  Vaping Use   Vaping status: Never Used  Substance and Sexual Activity   Alcohol use: Not Currently    Comment: 04/18/2018 "quit years ago"   Drug use: Not Currently    Comment: "back in my 56s"   Sexual activity: Not Currently  Other Topics Concern   Not on file  Social History Narrative   ** Merged History Encounter **       Patient has one son whom is healthy.   Has a live in boyfriend.   Social Drivers of Corporate investment banker Strain: Not on file  Food Insecurity: No Food Insecurity (03/20/2024)  Hunger Vital Sign    Worried About Running Out of Food in the Last Year: Never true    Ran Out of Food in the Last Year: Never true  Transportation Needs: No Transportation Needs (03/20/2024)   PRAPARE - Administrator, Civil Service (Medical): No    Lack of Transportation (Non-Medical): No  Physical Activity: Not on file  Stress: Not on file  Social Connections: Moderately Integrated (03/20/2024)   Social Connection and Isolation Panel [NHANES]    Frequency of Communication with Friends and Family: More than three  times a week    Frequency of Social Gatherings with Friends and Family: More than three times a week    Attends Religious Services: More than 4 times per year    Active Member of [employer] or Organizations: No    Attends Engineer, structural: Never    Marital Status: Married   Water engineer and Provision:  Detailed education and instructions provided on heart failure disease management including the following:  Signs and symptoms of Heart Failure When to call the physician Importance of daily weights Low sodium diet Fluid restriction Medication management Anticipated future follow-up appointments  Patient education given on each of the above topics.  Patient acknowledges understanding via teach back method and acceptance of all instructions.  Education Materials:  "Living Better With Heart Failure" Booklet, HF zone tool, & Daily Weight Tracker Tool.  Patient has scale at home: Yes Patient has pill box at home: Yes    High Risk Criteria for Readmission and/or Poor Patient Outcomes: Heart failure hospital admissions (last 6 months): 1  No Show rate: 0% Difficult social situation: None Demonstrates medication adherence: No- after discharge patient went to pick up her prescriptions at [facility] in [place] and was told that her Enstresto and Farxiga would be over 1,000 dollars so she did not pick it up.   Primary Language: English-Able to read, write and comprehend.  Barriers of Care:   Cost of HF Medications  Considerations/Referrals:   Referral made to Heart Failure Pharmacist Deborah Cooley sent in insurance coverage request for patient on 03/24/24 since patient was unable to afford Rx when she went to go pick it up. They are working on the pathway for patient with the pharmacy since she is has no medication coverage for it under Medicare A & B. Referral made to Heart Failure CSW/NCM TOC: No Referral made to Heart & Vascular TOC clinic:  Yes.   Items for Follow-up on DC/TOC: Diet & Fluid Restrictions-patient drinks large amounts of coffee daily-"Pots of coffee" Daily Weights Long term cigarette smoker- has not smoked for 1 day since she was hospitalized  New Heart Failure Medications-make sure that patient was able to obtain her medications with patient assistance.  Continued Heart Failure Education-please provide patient with "Living Better with Heart Failure Education booklet  Celedonio Coil, RN, BSN [facility] Heart Failure Navigator Secure Chat Only

## 2024-03-24 NOTE — Telephone Encounter (Signed)
 Patient's daughter is calling to follow up. Patient's daughter stated the patient missed her dosage for last night and this morning due to being out of medication. Please advise.

## 2024-03-24 NOTE — Telephone Encounter (Signed)
 Returned call to daughter to notify that we do not have a DPR on file for her. No answer. Busy signal.

## 2024-03-24 NOTE — Telephone Encounter (Signed)
 PAP: Patient assistance application for Farxiga through [employer] (AZ&Me) has been mailed to pt's home address on file.   Called pt on her home number

## 2024-03-24 NOTE — Telephone Encounter (Signed)
 Spoke to patient and she said she picked this up from the pharmacy after someone called her and told her about 2 free 30 day vouchers for both of these meds so she no longer needs the samples. And she will fill out the applications once she gets them in the mail. Thank you

## 2024-03-25 NOTE — Progress Notes (Signed)
 HEART & VASCULAR TRANSITION OF CARE CONSULT NOTE    Referring Physician: Dr. Arlie Lain, Dewaine Footman, FNP   Chief Complaint:   HPI: Referred to clinic by Dr. Mason Sole for heart failure consultation.   Deborah Cooley is a 68 y.o. female with recently diagnosed systolic heart failure, hypertension, angioimmunoblastic lymphoma and tobacco abuse.  Recently admitted 5/25 with progressive DOE and chest discomfort.  BNP was elevated. HsTrop flat. CXR and CT chest with pulmonary vascular congestion. She was started on IV diuresis. Echo showed new drop in EF to 35-40% (previously 60-65% 19'). GDMT was started and OP cards f/u arranged.   Today she presents for AHF [facility] clinic visit with her husband. Overall feeling ok. Denies palpitations, CP, edema, or PND/Orthopnea. Reports intt dizziness with medications. SOB with prolonged exertion. Appetite ok, improved since she's been home, does not watch what she eats. No fever or chills. Weight at home 117-123 pounds. Taking all medications. Denies ETOH, tobacco or drug use. She used to smoke 1PPD since the age of 39. Has stopped since she was admitted. Only cardiac history if that dad passed awar from MI, mom as well. She drinks 1 pot of coffee a day.   Cardiac studies reviewed:  Echo 5/25: EF 35-40%, LV with GHK. RV normal, small pericardial effusion present, trivial MR. No TR.  Echo 6/19: EF 60-65%, no RWMA, small pericardial effusion seen  Past Medical History:  Diagnosis Date   angioimmunoblastic lymphoma dx'd 03/2018   BCC (basal cell carcinoma of skin) 05/02/2023   left nasal crease - schedule for Mohs   BCC (basal cell carcinoma), face 05/02/2023   mid tip of nose - schedule for Mohs   Bilateral pleural effusion 04/17/2018   Endometriosis    Essential hypertension, benign    Family history of adverse reaction to anesthesia    "sister stops breathing I think" (04/18/2018)   Heart murmur    Mixed hyperlipidemia    Pneumonia 04/17/2018     Current Outpatient Medications  Medication Sig Dispense Refill   dapagliflozin  propanediol (FARXIGA ) 10 MG TABS tablet Take 1 tablet (10 mg total) by mouth daily. 28 tablet 0   furosemide  (LASIX ) 20 MG tablet Take 1 tablet (20 mg total) by mouth daily. 30 tablet 2   metoprolol  tartrate (LOPRESSOR ) 25 MG tablet Take 1 tablet (25 mg total) by mouth 2 (two) times daily. 60 tablet 2   sacubitril -valsartan  (ENTRESTO ) 24-26 MG Take 1 tablet by mouth 2 (two) times daily. 60 tablet 2   No current facility-administered medications for this encounter.    Allergies  Allergen Reactions   Ambien  [Zolpidem ] Other (See Comments)     Confusion, lightheadedness, dizzy, bad dreams      Social History   Socioeconomic History   Marital status: Married    Spouse name: Not on file   Number of children: 1   Years of education: Not on file   Highest education level: GED or equivalent  Occupational History   Occupation: Designer, industrial/product    Comment: works at [employer]  Tobacco Use   Smoking status: Every Day    Current packs/day: 1.50    Average packs/day: 1.5 packs/day for 46.0 years (69.0 ttl pk-yrs)    Types: Cigarettes    Passive exposure: Current   Smokeless tobacco: Never  Vaping Use   Vaping status: Never Used  Substance and Sexual Activity   Alcohol use: Not Currently    Comment: 04/18/2018 "quit years ago"  Drug use: Not Currently    Comment: "back in my 76s"   Sexual activity: Not Currently  Other Topics Concern   Not on file  Social History Narrative   ** Merged History Encounter **       Patient has one son whom is healthy.   Has a live in boyfriend.   Social Drivers of Corporate investment banker Strain: Not on file  Food Insecurity: No Food Insecurity (03/24/2024)   Hunger Vital Sign    Worried About Running Out of Food in the Last Year: Never true    Ran Out of Food in the Last Year: Never true  Transportation Needs: No Transportation Needs (03/24/2024)    PRAPARE - Administrator, Civil Service (Medical): No    Lack of Transportation (Non-Medical): No  Physical Activity: Not on file  Stress: Not on file  Social Connections: Moderately Integrated (03/20/2024)   Social Connection and Isolation Panel [NHANES]    Frequency of Communication with Friends and Family: More than three times a week    Frequency of Social Gatherings with Friends and Family: More than three times a week    Attends Religious Services: More than 4 times per year    Active Member of [employer] or Organizations: No    Attends Banker Meetings: Never    Marital Status: Married  Catering manager Violence: Not At Risk (03/24/2024)   Humiliation, Afraid, Rape, and Kick questionnaire    Fear of Current or Ex-Partner: No    Emotionally Abused: No    Physically Abused: No    Sexually Abused: No      Family History  Problem Relation Age of Onset   Diabetes Mother        age 53   Heart attack Father        died age 21's   Hypertension Father    Cancer Sister        breast cancer diagonsed age 60 years   Diabetes Other    Heart disease Other        Female <55   Arthritis Other    Asthma Other    Hyperlipidemia Other        several siblings   Thyroid disease Sister        one sister with thyroid disease    Vitals:   03/30/24 0813  BP: 110/60  Pulse: 65  SpO2: 96%  Weight: 56.2 kg (124 lb)  Height: 5\' 2"  (1.575 m)    PHYSICAL EXAM: General:  well appearing.  No respiratory difficulty. Walked into clinic Neck: supple. JVD flat.  Cor: PMI nondisplaced. Regular rate & rhythm. No rubs, gallops or murmurs. Lungs: clear Extremities: no cyanosis, clubbing, rash, edema  Neuro: alert & oriented x 3. Moves all 4 extremities w/o difficulty. Affect pleasant.   Wt Readings from Last 3 Encounters:  03/30/24 56.2 kg (124 lb)  03/23/24 53.5 kg (117 lb 15.1 oz)  02/12/24 57.3 kg (126 lb 6.4 oz)   ECG: SB 57 bpm (Personally reviewed)    ASSESSMENT &  PLAN: Recently diagnosed systolic heart failure - Echo 4/09: EF 35-40%, LV with GHK. RV normal, small pericardial effusion present, trivial MR. No TR.  - Echo 6/19: EF 60-65%, no RWMA, small pericardial effusion seen NYHA II-IIIa, appears euvolemic GDMT  Diuretic- Change lasix  20 mg daily to PRN BB- Switch metoprolol  tartrate 25 mg BID to toprol  XL 50 mg daily Ace/ARB/ARNI -Continue Entresto  24-26 mg BID, will  not increase with intt dizziness MRA Start spiro 12.5 mg daily. Labs today. Repeat BMET in ~10 days SGLT2i Continue Farxiga  10 mg daily. Denies GU symptoms Plan for OP ischemic workup with cardiac CT  - Will need repeat echo in ~ 3 months  HTN - BP stable today  Tobacco abuse - stopped smoking since beign discharged. Congratulated.     Referred to HFSW (PCP, Medications, Transportation, ETOH Abuse, Drug Abuse, Insurance, Financial ):  No Refer to Pharmacy:  No Refer to Home Health: No Refer to Advanced Heart Failure Clinic: No  Refer to General Cardiology: Yes   Follow up with general cardiology as scheduled.

## 2024-03-27 ENCOUNTER — Telehealth (HOSPITAL_COMMUNITY): Payer: Self-pay

## 2024-03-27 NOTE — Telephone Encounter (Signed)
 Called to confirm/remind patient of their appointment at the Advanced Heart Failure Clinic on 03/30/2024 8:15.   Appointment:   [x] Confirmed  [] Left mess   [] No answer/No voice mail  [] VM Full/unable to leave message  [] Phone not in service  Patient reminded to bring all medications and/or complete list.  Confirmed patient has transportation. Gave directions, instructed to utilize valet parking.

## 2024-03-30 ENCOUNTER — Encounter (HOSPITAL_COMMUNITY): Payer: Self-pay

## 2024-03-30 ENCOUNTER — Ambulatory Visit (HOSPITAL_COMMUNITY)
Admission: RE | Admit: 2024-03-30 | Discharge: 2024-03-30 | Disposition: A | Discharge status: Home / Self Care | Source: Ambulatory Visit | Attending: Internal Medicine | Admitting: Internal Medicine

## 2024-03-30 ENCOUNTER — Telehealth (HOSPITAL_COMMUNITY): Payer: Self-pay | Admitting: Pharmacy Technician

## 2024-03-30 VITALS — BP 110/60 | HR 65 | Ht 62.0 in | Wt 124.0 lb

## 2024-03-30 DIAGNOSIS — Z720 Tobacco use: Secondary | ICD-10-CM

## 2024-03-30 DIAGNOSIS — R7989 Other specified abnormal findings of blood chemistry: Secondary | ICD-10-CM | POA: Insufficient documentation

## 2024-03-30 DIAGNOSIS — I3139 Other pericardial effusion (noninflammatory): Secondary | ICD-10-CM | POA: Diagnosis not present

## 2024-03-30 DIAGNOSIS — Z79899 Other long term (current) drug therapy: Secondary | ICD-10-CM | POA: Diagnosis not present

## 2024-03-30 DIAGNOSIS — I110 Hypertensive heart disease with heart failure: Secondary | ICD-10-CM | POA: Diagnosis present

## 2024-03-30 DIAGNOSIS — F17210 Nicotine dependence, cigarettes, uncomplicated: Secondary | ICD-10-CM | POA: Diagnosis not present

## 2024-03-30 DIAGNOSIS — R0989 Other specified symptoms and signs involving the circulatory and respiratory systems: Secondary | ICD-10-CM | POA: Insufficient documentation

## 2024-03-30 DIAGNOSIS — I5022 Chronic systolic (congestive) heart failure: Secondary | ICD-10-CM

## 2024-03-30 DIAGNOSIS — Z8249 Family history of ischemic heart disease and other diseases of the circulatory system: Secondary | ICD-10-CM | POA: Diagnosis not present

## 2024-03-30 DIAGNOSIS — I10 Essential (primary) hypertension: Secondary | ICD-10-CM | POA: Diagnosis not present

## 2024-03-30 DIAGNOSIS — I509 Heart failure, unspecified: Secondary | ICD-10-CM | POA: Insufficient documentation

## 2024-03-30 LAB — BASIC METABOLIC PANEL WITH GFR
Anion gap: 6 (ref 5–15)
BUN: 21 mg/dL (ref 8–23)
CO2: 24 mmol/L (ref 22–32)
Calcium: 9.6 mg/dL (ref 8.9–10.3)
Chloride: 104 mmol/L (ref 98–111)
Creatinine, Ser: 1.1 mg/dL — ABNORMAL HIGH (ref 0.44–1.00)
GFR, Estimated: 55 mL/min — ABNORMAL LOW (ref >60)
Glucose, Bld: 136 mg/dL — ABNORMAL HIGH (ref 70–99)
Potassium: 4.2 mmol/L (ref 3.5–5.1)
Sodium: 134 mmol/L — ABNORMAL LOW (ref 135–145)

## 2024-03-30 LAB — BRAIN NATRIURETIC PEPTIDE: B Natriuretic Peptide: 1089.5 pg/mL — ABNORMAL HIGH (ref 0.0–100.0)

## 2024-03-30 MED ORDER — FUROSEMIDE 20 MG PO TABS: 20.0000 mg | ORAL_TABLET | Freq: Every day | ORAL | Status: DC | PRN

## 2024-03-30 MED ORDER — SPIRONOLACTONE 25 MG PO TABS: 12.5000 mg | ORAL_TABLET | Freq: Every day | ORAL | 1 refills | Status: DC

## 2024-03-30 MED ORDER — SACUBITRIL-VALSARTAN 24-26 MG PO TABS: 1.0000 | ORAL_TABLET | Freq: Two times a day (BID) | ORAL | Status: DC

## 2024-03-30 MED ORDER — METOPROLOL SUCCINATE ER 50 MG PO TB24: 50.0000 mg | ORAL_TABLET | Freq: Every day | ORAL | 1 refills | Status: DC

## 2024-03-30 MED ORDER — DAPAGLIFLOZIN PROPANEDIOL 10 MG PO TABS: 10.0000 mg | ORAL_TABLET | Freq: Every day | ORAL | Status: DC

## 2024-03-30 NOTE — Progress Notes (Signed)
 Medication Samples have been provided to the patient.  Drug name: Entresto       Strength: 24/26mg         Qty: 2  LOT: [id number]   Exp.Date: 02/2025  Dosing instructions: take 1 tab Twice daily   The patient has been instructed regarding the correct time, dose, and frequency of taking this medication, including desired effects and most common side effects.   Deborah Cooley 9:02 AM 03/30/2024   Medication Samples have been provided to the patient.  Drug name: Farxiga        Strength: 10mg         Qty: 4  LOT: Elis.Edelson  Exp.Date: 09/18/26  Dosing instructions: take 1 tab daily  The patient has been instructed regarding the correct time, dose, and frequency of taking this medication, including desired effects and most common side effects.   Deborah Cooley 9:03 AM 03/30/2024

## 2024-03-30 NOTE — Patient Instructions (Signed)
 Medication Changes:  STOP Metoprolol  Tartrate  START Metoprolol  Succinate 50 mg Daily at bedtime  START Spironolactone 12.5 mg (1/2 tab) Daily  Lab Work:  Labs done today, your results will be available in MyChart, we will contact you for abnormal readings.  Your physician recommends that you return for lab work in: 1-2 weeks, this can be done at [facility]   Special Instructions // Education:  Do the following things EVERYDAY: Weigh yourself in the morning before breakfast. Write it down and keep it in a log. Take your medicines as prescribed Eat low salt foods--Limit salt (sodium) to 2000 mg per day.  Stay as active as you can everyday Limit all fluids for the day to less than 2 liters   Follow-Up in: Thank you for allowing us  to provider your heart failure care after your recent hospitalization. Please follow-up with [facility] in [place] as scheduled   If you have any questions, issues, or concerns before your next appointment please call our office at [phone], opt. 2 and leave a message for the triage nurse.

## 2024-03-30 NOTE — Telephone Encounter (Signed)
 Advanced Heart Failure Patient Advocate Encounter  Started applications for both Farxiga  and Entresto  patient assistance. Provided to patient while in clinic. Patient is aware she will need POI in order for Entresto  to be approved and to take to her follow up cardiology office.  Correne Dillon, CPhT

## 2024-03-31 ENCOUNTER — Ambulatory Visit (HOSPITAL_COMMUNITY): Payer: Self-pay | Admitting: Internal Medicine

## 2024-03-31 MED ORDER — FUROSEMIDE 20 MG PO TABS: 20.0000 mg | ORAL_TABLET | Freq: Every day | ORAL | Status: DC

## 2024-03-31 NOTE — Telephone Encounter (Signed)
-----   Message from Sheryl Donna sent at 03/30/2024 11:59 AM EDT ----- Fluid marker slightly elevated. Spiro started today. Lasix  was initially changed to PRN but she needs to change back to 20 mg daily along with starting spiro.

## 2024-03-31 NOTE — Telephone Encounter (Signed)
 Spoke with patient regarding the following results. Patient made aware and patient verbalized understanding.   Patient will restart furosemide  20mg  every day- medication list updated.   Advised patient to call back to office with any issues, questions, or concerns. Patient verbalized understanding.

## 2024-04-06 ENCOUNTER — Other Ambulatory Visit (HOSPITAL_COMMUNITY)
Admission: RE | Admit: 2024-04-06 | Discharge: 2024-04-06 | Disposition: A | Discharge status: Home / Self Care | Source: Ambulatory Visit | Attending: Internal Medicine | Admitting: Internal Medicine

## 2024-04-06 DIAGNOSIS — I5022 Chronic systolic (congestive) heart failure: Secondary | ICD-10-CM | POA: Diagnosis present

## 2024-04-06 LAB — BASIC METABOLIC PANEL WITH GFR
Anion gap: 10 (ref 5–15)
BUN: 27 mg/dL — ABNORMAL HIGH (ref 8–23)
CO2: 23 mmol/L (ref 22–32)
Calcium: 9.8 mg/dL (ref 8.9–10.3)
Chloride: 98 mmol/L (ref 98–111)
Creatinine, Ser: 1.16 mg/dL — ABNORMAL HIGH (ref 0.44–1.00)
GFR, Estimated: 51 mL/min — ABNORMAL LOW (ref >60)
Glucose, Bld: 136 mg/dL — ABNORMAL HIGH (ref 70–99)
Potassium: 3.8 mmol/L (ref 3.5–5.1)
Sodium: 131 mmol/L — ABNORMAL LOW (ref 135–145)

## 2024-04-07 ENCOUNTER — Encounter: Payer: Self-pay | Admitting: Medical

## 2024-04-07 ENCOUNTER — Other Ambulatory Visit (HOSPITAL_COMMUNITY): Payer: Self-pay

## 2024-04-07 ENCOUNTER — Ambulatory Visit: Attending: Medical | Admitting: Medical

## 2024-04-07 VITALS — BP 106/50 | HR 64 | Ht 62.0 in | Wt 122.0 lb

## 2024-04-07 DIAGNOSIS — Z720 Tobacco use: Secondary | ICD-10-CM | POA: Insufficient documentation

## 2024-04-07 DIAGNOSIS — I5020 Unspecified systolic (congestive) heart failure: Secondary | ICD-10-CM | POA: Insufficient documentation

## 2024-04-07 DIAGNOSIS — E782 Mixed hyperlipidemia: Secondary | ICD-10-CM | POA: Diagnosis present

## 2024-04-07 MED ORDER — METOPROLOL TARTRATE 25 MG PO TABS: ORAL_TABLET | ORAL | 0 refills | Status: DC

## 2024-04-07 NOTE — Telephone Encounter (Signed)
 Faxed az&me application. Missing oop and proof of income  Called patient and advised and she said she will come to the office to drop off the oop and income

## 2024-04-07 NOTE — Telephone Encounter (Signed)
 Faxed [employer] entresto  application. Missing oop and proof of income  Called patient and advised and she said she will come to the office to drop off the oop and income

## 2024-04-07 NOTE — Patient Instructions (Signed)
 Medication Instructions:  Your physician recommends that you continue on your current medications as directed. Please refer to the Current Medication list given to you today.   Take Lopressor  25 mg (1 tablet) 2 hours before your cardiac ct  Labwork: None today  Testing/Procedures: Echo in early August 2025       Your cardiac CT will be scheduled at one of the below locations:      [facility] [address]  [place], [place] [address] Opening March 16, 2024     If scheduled at the Heart and Vascular Tower at [employer], please enter the parking lot using the [address] street entrance and use the FREE valet service at the patient drop-off area. Enter the buidling and check-in with registration on the main floor. Please follow these instructions carefully (unless otherwise directed):  An IV will be required for this test and Nitroglycerin will be given.  Hold all erectile dysfunction medications at least 3 days (72 hrs) prior to test. (Ie viagra, cialis, sildenafil, tadalafil, etc)   On the Night Before the Test: Be sure to Drink plenty of water. Do not consume any caffeinated/decaffeinated beverages or chocolate 12 hours prior to your test. Do not take any antihistamines 12 hours prior to your test.  On the Day of the Test: Drink plenty of water until 1 hour prior to the test. Do not eat any food 1 hour prior to test. You may take your regular medications prior to the test.  Take metoprolol  (Lopressor ) two hours prior to test. If you take Furosemide /Hydrochlorothiazide/Spironolactone /Chlorthalidone, please HOLD on the morning of the test. Patients who wear a continuous glucose monitor MUST remove the device prior to scanning. FEMALES- please wear underwire-free bra if available, avoid dresses & tight clothing       After the Test: Drink plenty of water. After receiving IV contrast, you may experience a mild flushed feeling. This is  normal. On occasion, you may experience a mild rash up to 24 hours after the test. This is not dangerous. If this occurs, you can take Benadryl  25 mg, Zyrtec, Claritin, or Allegra and increase your fluid intake. (Patients taking Tikosyn should avoid Benadryl , and may take Zyrtec, Claritin, or Allegra) If you experience trouble breathing, this can be serious. If it is severe call 911 IMMEDIATELY. If it is mild, please call our office.  We will call to schedule your test 2-4 weeks out understanding that some insurance companies will need an authorization prior to the service being performed.   For more information and frequently asked questions, please visit our website : [email or web address]  For non-scheduling related questions, please contact the cardiac imaging nurse navigator should you have any questions/concerns: Cardiac Imaging Nurse Navigators Direct Office Dial: [phone]   For scheduling needs, including cancellations and rescheduling, please call [place], [phone].   Follow-Up: After Echo in August    Any Other Special Instructions Will Be Listed Below (If Applicable).  If you need a refill on your cardiac medications before your next appointment, please call your pharmacy.

## 2024-04-07 NOTE — Telephone Encounter (Signed)
 Hi, I scanned in media under "[employer] APPLICATIONS" her applications that were sent. Thank you!

## 2024-04-07 NOTE — Progress Notes (Signed)
 Cardiology Office Note:  .   Date:  04/07/2024  ID:  Deborah Cooley, DOB June 22, 1956, MRN [id number] PCP: Gwenevere Lent, FNP  [facility] Providers Cardiologist:  None     History of Present Illness: Aaron Aas   Deborah Cooley is a 68 y.o. female with a h/o HTN, angioimmunoblastic lymphoma who is being seen for hospital follow-up for CHF.   The patient presented to the ER 03/23/24 with DOE and chest discomfort. BNP 1311. No PE on chest CT, but it did show bilateral pleural effusions. Echo showed LVEF 35-40% which was new drop in EF. She was sent home on lasix  20mg  daily, farxina 10mg  daily, metoprolol  25mg  daily, Entresto  24-26mg  BID.   Today,  the patient is overall doing well. She has been taking medications. Breathing has been good. No lower leg edema or chest pain. No orthopnea or pnd. She has cut back on smoking and sodium intake. She is smoking less than 1/2 pack.    Studies Reviewed: .        Echo 03/21/2024 1. Left ventricular ejection fraction, by estimation, is 35 to 40%. The  left ventricle has moderately decreased function. The left ventricle  demonstrates global hypokinesis. Left ventricular diastolic parameters are  indeterminate.   2. Right ventricular systolic function is normal. The right ventricular  size is normal. Tricuspid regurgitation signal is inadequate for assessing  PA pressure.   3. A small pericardial effusion is present. The pericardial effusion is  circumferential.   4. The mitral valve is normal in structure. Trivial mitral valve  regurgitation. No evidence of mitral stenosis.   5. The aortic valve is tricuspid. Aortic valve regurgitation is not  visualized. No aortic stenosis is present.   6. The inferior vena cava is normal in size with greater than 50%  respiratory variability, suggesting right atrial pressure of 3 mmHg.   Echo 04/2018 Study Conclusions   - Left ventricle: The cavity size was normal. Systolic function was    normal. The  estimated ejection fraction was in the range of 60%    to 65%. Wall motion was normal; there were no regional wall    motion abnormalities. Left ventricular diastolic function    parameters were normal.  - Pericardium, extracardiac: A small, free-flowing pericardial    effusion was identified circumferential to the heart. There was    no evidence of hemodynamic compromise.        Physical Exam:   VS:  BP (!) 106/50 (BP Location: Left Arm, Cuff Size: Normal)   Pulse 64   Ht 5\' 2"  (1.575 m)   Wt 122 lb (55.3 kg)   SpO2 99%   BMI 22.31 kg/m    Wt Readings from Last 3 Encounters:  04/07/24 122 lb (55.3 kg)  03/30/24 124 lb (56.2 kg)  03/23/24 117 lb 15.1 oz (53.5 kg)    GEN: Well nourished, well developed in no acute distress NECK: No JVD; No carotid bruits CARDIAC: RRR, no murmurs, rubs, gallops RESPIRATORY:  Clear to auscultation without rales, wheezing or rhonchi  ABDOMEN: Soft, non-tender, non-distended EXTREMITIES:  No edema; No deformity   ASSESSMENT AND PLAN: .    Chronic systolic heart failure Recent admission for acute heart failure treated with IV lasix . Echo showed reduced LVEF 35-40%. She was sent home on lasix  20mg  daily, Toprol  50mg  daily, Farxiga  10mg  daily, Entresto  24/26mg BID and spiro 12.5mg  daily. Follow-up BMET stable. She is euvolemic on exam. Unknown etiology of cardiomyopathy. I will order  a Cardiac CTA. Plan to repeat echo in 3 months.   HLD LDL 141. ASCVD risk 13-14.5% risk recommend mod to high intensity statin. Chest CTA showed aortic atherosclerosis. Can further address at follow-up.   Nicotine  abuse She decreased smoking to less than halg pack per day. She was congratulated.        Dispo: Follow-up in 2-3 months  Signed, Rosealie Reach Rebekah Canada, PA-C

## 2024-04-08 NOTE — Telephone Encounter (Signed)
   The patient said she is going to drop proof of income at the office

## 2024-04-08 NOTE — Telephone Encounter (Signed)
 I called and spoke to the patient and she is aware.

## 2024-04-09 NOTE — Telephone Encounter (Signed)
 Received income. Faxed [employer]

## 2024-04-14 ENCOUNTER — Telehealth: Payer: Self-pay | Admitting: Cardiology

## 2024-04-14 NOTE — Telephone Encounter (Signed)
 Pt states that she was standing hugging her husband and passed out. She states that she had no warning but her legs did begin to shake. She reports that her husband lowered her to the ground. Pt states that she felt fine when she woke up. She states the she drank coffee and diet pepsi all day on Sunday. She reports this happening around 11- 11:30 while she was getting ready for bed. Pt denies voiding while passed out. Husband states that she was passed out for around 30 sec to a min. After waking up pt was helped in the bed by her husband for the night. Pt instructed to seek ER eval. If this happens again. Please advise.

## 2024-04-14 NOTE — Telephone Encounter (Signed)
 Patient needs to fill out attestation letter per [employer]. They said they talked to her on 04/10/24 and sent her the form to sign, saying she does not file taxes.

## 2024-04-14 NOTE — Telephone Encounter (Signed)
 Patient states that she passed out on Sunday, prior to that she has had 2 fingers swollen on a right hand. Patient is unsure if it because of the meds. Please advise

## 2024-04-15 ENCOUNTER — Telehealth: Payer: Self-pay | Admitting: *Deleted

## 2024-04-15 ENCOUNTER — Ambulatory Visit: Attending: Cardiology | Admitting: *Deleted

## 2024-04-15 VITALS — BP 114/66 | HR 71 | Ht 62.0 in | Wt 119.4 lb

## 2024-04-15 DIAGNOSIS — Z0130 Encounter for examination of blood pressure without abnormal findings: Secondary | ICD-10-CM

## 2024-04-15 MED ORDER — FUROSEMIDE 20 MG PO TABS: 20.0000 mg | ORAL_TABLET | ORAL | 3 refills | Status: DC

## 2024-04-15 NOTE — Patient Instructions (Signed)
 Medication Instructions:  Stop Taking Entresto    Decrease Lasix  to 20 mg Every other day.   Labwork: NONE   Testing/Procedures: NONE   Follow-Up: Nurse visit in 2 weeks   Any Other Special Instructions Will Be Listed Below (If Applicable).     If you need a refill on your cardiac medications before your next appointment, please call your pharmacy.

## 2024-04-15 NOTE — Telephone Encounter (Signed)
 Pt notified to hold off on meds until nurse visit.

## 2024-04-15 NOTE — Telephone Encounter (Signed)
-----   Message from Teddie Favre sent at 04/15/2024  4:38 PM EDT ----- Reviewed.  Would suggest stopping Entresto  at this time and reduce Lasix  to 20 mg every other day.  Otherwise no change to present cardiac regimen.  She should keep an eye on daily weights, please schedule follow-up nurse visit with repeat vital signs and orthostatics in the next 1 to 2 weeks.  May be able to add low-dose losartan depending on subsequent blood pressures.  Keep follow-up with Dr. Mallipeddi. ----- Message ----- From: Mohit Zirbes G, LPN Sent: 02/25/8118   4:14 PM EDT To: Gerard Knight, MD

## 2024-04-15 NOTE — Addendum Note (Signed)
 Addended by: Quiera Diffee G on: 04/15/2024 04:43 PM   Modules accepted: Orders

## 2024-04-15 NOTE — Telephone Encounter (Signed)
 Spoke with pt who agrees to come into office today for nurse visit. Pt states that she passed out again last night after taking her night meds. She reports that this time she did feel dizzy. Pt did not seek ER evaluation because she states that it was raining and her husband does not see well when driving in the rain.

## 2024-04-15 NOTE — Progress Notes (Addendum)
 Pt in office for nurse visit for vitals. Lying BP 114/66 Hr 71, sitting BP 111/66 Hr 60, standing 94/59 Hr 72, standing for 3 mins. 102/65 Hr 69.

## 2024-04-16 NOTE — Telephone Encounter (Signed)
 Pt notified to hold medications, wear support stockings, increase fluid to 1 L a day. and follow up in 1 month.

## 2024-04-17 NOTE — Telephone Encounter (Signed)
 Patient is going to come by the heartcare office and drop off the attestation letter so then I can fax that in to [employer]

## 2024-04-23 NOTE — Telephone Encounter (Signed)
 I called [employer] and they said they still have not received the attestation letter. I called the patient twice and both times the phone was answered but then silence.

## 2024-04-24 NOTE — Telephone Encounter (Addendum)
 Approval letter in media  Approved until 11/18/24  Patient will be notified by mail

## 2024-04-29 ENCOUNTER — Ambulatory Visit: Attending: Cardiology | Admitting: *Deleted

## 2024-04-29 VITALS — BP 179/99

## 2024-04-29 DIAGNOSIS — Z0130 Encounter for examination of blood pressure without abnormal findings: Secondary | ICD-10-CM | POA: Diagnosis not present

## 2024-04-29 NOTE — Progress Notes (Signed)
 Pt states that she feel fine and denies anymore syncopal episodes.

## 2024-05-01 ENCOUNTER — Ambulatory Visit: Payer: Self-pay | Admitting: Internal Medicine

## 2024-05-01 ENCOUNTER — Telehealth (HOSPITAL_COMMUNITY): Payer: Self-pay | Admitting: Emergency Medicine

## 2024-05-01 NOTE — Telephone Encounter (Signed)
 Invalid number Jinger Mount RN Navigator Cardiac Imaging [facility] [phone] Office  [phone] Cell

## 2024-05-04 ENCOUNTER — Ambulatory Visit (HOSPITAL_COMMUNITY)
Admission: RE | Admit: 2024-05-04 | Discharge: 2024-05-04 | Disposition: A | Discharge status: Home / Self Care | Source: Ambulatory Visit | Attending: Medical

## 2024-05-04 ENCOUNTER — Encounter: Payer: Self-pay | Admitting: Hematology

## 2024-05-04 DIAGNOSIS — I5020 Unspecified systolic (congestive) heart failure: Secondary | ICD-10-CM | POA: Insufficient documentation

## 2024-05-04 DIAGNOSIS — R931 Abnormal findings on diagnostic imaging of heart and coronary circulation: Secondary | ICD-10-CM | POA: Insufficient documentation

## 2024-05-04 DIAGNOSIS — I2510 Atherosclerotic heart disease of native coronary artery without angina pectoris: Secondary | ICD-10-CM

## 2024-05-04 MED ORDER — NITROGLYCERIN 0.4 MG SL SUBL
0.8000 mg | SUBLINGUAL_TABLET | Freq: Once | SUBLINGUAL | Status: AC
  Administered 2024-05-04: 0.8 mg via SUBLINGUAL

## 2024-05-04 MED ORDER — IOHEXOL 350 MG/ML SOLN
100.0000 mL | Freq: Once | INTRAVENOUS | Status: AC | PRN
  Administered 2024-05-04: 100 mL via INTRAVENOUS

## 2024-05-05 ENCOUNTER — Ambulatory Visit: Payer: Self-pay | Admitting: Medical

## 2024-05-05 ENCOUNTER — Ambulatory Visit (HOSPITAL_COMMUNITY)
Admission: RE | Admit: 2024-05-05 | Discharge: 2024-05-05 | Disposition: A | Discharge status: Home / Self Care | Source: Ambulatory Visit | Attending: Cardiology | Admitting: Cardiology

## 2024-05-05 ENCOUNTER — Other Ambulatory Visit: Payer: Self-pay | Admitting: Cardiology

## 2024-05-05 ENCOUNTER — Ambulatory Visit: Payer: Self-pay | Admitting: Cardiology

## 2024-05-05 DIAGNOSIS — R931 Abnormal findings on diagnostic imaging of heart and coronary circulation: Secondary | ICD-10-CM

## 2024-05-05 DIAGNOSIS — I2510 Atherosclerotic heart disease of native coronary artery without angina pectoris: Secondary | ICD-10-CM | POA: Diagnosis not present

## 2024-05-06 MED ORDER — ASPIRIN 81 MG PO TBEC: 81.0000 mg | DELAYED_RELEASE_TABLET | Freq: Every day | ORAL | 12 refills | Status: AC

## 2024-05-06 MED ORDER — NITROGLYCERIN 0.4 MG SL SUBL: 0.4000 mg | SUBLINGUAL_TABLET | SUBLINGUAL | 3 refills | Status: AC | PRN

## 2024-05-06 NOTE — Telephone Encounter (Signed)
-----   Message from Cadence Rebekah Canada sent at 05/05/2024  2:54 PM EDT ----- Cardiac CTA showed significant CAD recommending cardiac cath. It was sent for further testing. Patient has apt 7/2, can bring her in sooner if possible, esp if patient is symptomatic. Would send in  SL NTG and start ASA 81mg  daily.  ----- Message ----- From: Interface, Rad Results In Sent: 05/05/2024   2:13 PM EDT To: Cadence Rebekah Canada, PA-C

## 2024-05-06 NOTE — Telephone Encounter (Signed)
 Left a message for patient to call office regarding testing results.

## 2024-05-08 ENCOUNTER — Ambulatory Visit: Attending: Physician Assistant | Admitting: Physician Assistant

## 2024-05-08 ENCOUNTER — Encounter: Payer: Self-pay | Admitting: Physician Assistant

## 2024-05-08 ENCOUNTER — Ambulatory Visit: Admitting: Nurse Practitioner

## 2024-05-08 VITALS — BP 104/68 | HR 95 | Ht 62.0 in | Wt 118.4 lb

## 2024-05-08 DIAGNOSIS — R55 Syncope and collapse: Secondary | ICD-10-CM | POA: Insufficient documentation

## 2024-05-08 DIAGNOSIS — I509 Heart failure, unspecified: Secondary | ICD-10-CM | POA: Diagnosis present

## 2024-05-08 DIAGNOSIS — I10 Essential (primary) hypertension: Secondary | ICD-10-CM | POA: Diagnosis present

## 2024-05-08 DIAGNOSIS — I2510 Atherosclerotic heart disease of native coronary artery without angina pectoris: Secondary | ICD-10-CM | POA: Diagnosis present

## 2024-05-08 DIAGNOSIS — E785 Hyperlipidemia, unspecified: Secondary | ICD-10-CM | POA: Insufficient documentation

## 2024-05-08 MED ORDER — FUROSEMIDE 20 MG PO TABS: 20.0000 mg | ORAL_TABLET | ORAL | Status: DC | PRN

## 2024-05-08 MED ORDER — METOPROLOL SUCCINATE ER 25 MG PO TB24: 25.0000 mg | ORAL_TABLET | Freq: Every day | ORAL | 1 refills | Status: DC

## 2024-05-08 MED ORDER — ATORVASTATIN CALCIUM 80 MG PO TABS: 80.0000 mg | ORAL_TABLET | Freq: Every day | ORAL | 3 refills | Status: AC

## 2024-05-08 NOTE — Patient Instructions (Signed)
 Medication Instructions:  START ATORVASTATIN 80 MG DAILY START METOPROLOL  SUCCINATE 25 MG DAILY RESTART ASPIRIN 81 MG DAILY TAKE LASIX  20 MG AS NEEDED *If you need a refill on your cardiac medications before your next appointment, please call your pharmacy*  Lab Work: NO LABS If you have labs (blood work) drawn today and your tests are completely normal, you will receive your results only by: MyChart Message (if you have MyChart) OR A paper copy in the mail If you have any lab test that is abnormal or we need to change your treatment, we will call you to review the results.  Testing/Procedures: NO TESTING  Follow-Up: At [facility], you and your health needs are our priority.  As part of our continuing mission to provide you with exceptional heart care, our providers are all part of one team.  This team includes your primary Cardiologist (physician) and Advanced Practice Providers or APPs (Physician Assistants and Nurse Practitioners) who all work together to provide you with the care you need, when you need it.  Your next appointment:   KEEP FOLLOW UP JULY 2025  Provider:   Vishnu P Mallipeddi, MD

## 2024-05-08 NOTE — Progress Notes (Unsigned)
 Cardiology Office Note   Date:  05/08/2024  ID:  Hidaya, Daniel 06/25/1956, MRN [id number] PCP: Adine Duwaine MATSU, FNP  [facility] Providers Cardiologist:  Diannah SHAUNNA Maywood, MD { Click to update primary MD,subspecialty MD or APP then REFRESH:1}    History of Present Illness Deborah Cooley is a 68 y.o. 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  Previous echocardiogram in June 2019 showed EF 60 to 65%.  Patient was admitted to the hospital on 03/23/2024 with dyspnea on exertion and the chest discomfort.  BNP was 1311.  CTA of the chest showed no evidence of PE but small bilateral pleural effusion and a partial subcompressive atelectasis in lower lobes.  Chest x-ray was consistent with pulmonary vascular congestion.  Echocardiogram during that admission showed EF 35 to 40%, normal RV function, no significant valvular disease.  She underwent IV diuresis and placed on Farxiga , spironolactone , metoprolol  succinate and Entresto .  She was seen by Cadence Furth PA-C on 04/07/2024.  Outpatient coronary CT was ordered and also repeat echocardiogram in 3 months.  We received a phone message on 04/14/2024 that the patient passed out while hugging her husband.  Total duration of passing out was about 30 seconds to 1 minute.  She was found to be orthostatic at the time, DOD gave instruction to hold medication for the time being until she can be reassessed later.  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  FFR analysis demonstrated possible subtotal occlusion of the proximal RCA, also hemodynamically significant flow-limiting disease in the LAD and OM1.  Cardiac catheterization was recommended.  Patient presents today for follow-up accompanied by husband.  She mention she actually passed out twice prior to 5/27.  She says the first time she passed out was immediately after she took the nighttime dose of her medication which I suspected was Entresto .  She stopped taking the nighttime dose of Entresto  and felt better the following day, on the third night, she took the nighttime dose again and passed out for the second time.  She has since came off of metoprolol , Entresto  and the spironolactone .  She also stopped her Lasix  for a while however restarted Lasix  for the past 3 days.  On physical exam, she appears to be euvolemic, I will change Lasix  as needed.  If she has been taking Farxiga .  I explained the reasoning being behind the heart failure medications.  On initial arrival, her blood pressure was 104/68, however when I checked it myself, manual blood pressure was 136/72.  Therefore I have restarted her on metoprolol  succinate 25 mg daily.  I will hold off on restarting spironolactone  and Entresto  at this time.  I asked her to start taking aspirin which she has not been doing recently.  Given the abnormal coronary CT, I also added 80 mg daily of Lipitor.  I had a long conversation with the patient and her husband regarding the abnormal coronary CTA and the recommendation to proceed with cardiac catheterization.  She was very anxious and  distraught when she received the news and wished to think about it for the time being.  She has a follow-up with Dr. Mallipeddi on 05/20/2024.  She is also somewhat irritated that she has been seeing multiple providers recently, in the future, she is however grateful regarding the medication adjustment and the information she has received, I think will be best for  her to follow-up with Dr. Bettyjo only.  ROS: ***  Studies Reviewed      *** Risk Assessment/Calculations {Does this patient have ATRIAL FIBRILLATION?:[phone]}         Physical Exam VS:  BP 104/68 (BP Location: Right Arm)   Pulse 95   Ht 5' 2 (1.575 m)   Wt 118 lb 6.4 oz (53.7 kg)   SpO2 94%   BMI 21.66 kg/m        Wt Readings from Last 3 Encounters:  05/08/24 118 lb 6.4 oz (53.7 kg)  04/15/24 119 lb 6.4 oz (54.2 kg)  04/07/24 122 lb (55.3 kg)    GEN: Well nourished, well developed in no acute distress NECK: No JVD; No carotid bruits CARDIAC: ***RRR, no murmurs, rubs, gallops RESPIRATORY:  Clear to auscultation without rales, wheezing or rhonchi  ABDOMEN: Soft, non-tender, non-distended EXTREMITIES:  No edema; No deformity   ASSESSMENT AND PLAN ***    {Are you ordering a CV Procedure (e.g. stress test, cath, DCCV, TEE, etc)?   Press F2        :[id number]}  Dispo: ***  Signed, Scot Ford, PA

## 2024-05-20 ENCOUNTER — Ambulatory Visit: Attending: Internal Medicine | Admitting: Internal Medicine

## 2024-05-20 ENCOUNTER — Telehealth: Payer: Self-pay | Admitting: Internal Medicine

## 2024-05-20 ENCOUNTER — Encounter: Payer: Self-pay | Admitting: Internal Medicine

## 2024-05-20 VITALS — BP 118/70 | HR 76 | Ht 62.0 in | Wt 120.0 lb

## 2024-05-20 DIAGNOSIS — J90 Pleural effusion, not elsewhere classified: Secondary | ICD-10-CM | POA: Insufficient documentation

## 2024-05-20 DIAGNOSIS — Z01810 Encounter for preprocedural cardiovascular examination: Secondary | ICD-10-CM | POA: Diagnosis present

## 2024-05-20 DIAGNOSIS — I5022 Chronic systolic (congestive) heart failure: Secondary | ICD-10-CM | POA: Diagnosis present

## 2024-05-20 DIAGNOSIS — I2510 Atherosclerotic heart disease of native coronary artery without angina pectoris: Secondary | ICD-10-CM | POA: Diagnosis present

## 2024-05-20 DIAGNOSIS — E7849 Other hyperlipidemia: Secondary | ICD-10-CM | POA: Diagnosis present

## 2024-05-20 DIAGNOSIS — E785 Hyperlipidemia, unspecified: Secondary | ICD-10-CM | POA: Insufficient documentation

## 2024-05-20 DIAGNOSIS — I509 Heart failure, unspecified: Secondary | ICD-10-CM | POA: Diagnosis present

## 2024-05-20 NOTE — H&P (View-Only) (Signed)
 Cardiology Office Note  Date: 05/20/2024   ID: Deborah, Cooley Mar 25, 1956, MRN [id number]  PCP:  Deborah Duwaine MATSU, FNP  Cardiologist:  Deborah SHAUNNA Maywood, MD Electrophysiologist:  None   History of Present Illness: Deborah Cooley is a 68 y.o. female   Echocardiogram in May 2025 showed LVEF 35 to 40%, new onset cardiomyopathy and CVP was 3 mmHg.  No evidence of valvular heart disease was noted.  CT cardiac was initially performed for ischemia evaluation, coronary calcium  score was noted to be 477 (93rd percentile for age and sex matched control), total plaque volume is extensive and there was noted to be subtotal proximal to mid RCA, 50 to 69% stenosis of the proximal OM1 and mid LAD.  These lesions were noted to be flow-limiting, possibly.  She was seen by Scot Ford in June 2025 where the plan was to schedule for [facility] but she wanted to hold off until her grandson graduates and joins [employer], [employer].  Now that he graduated and joined [employer], she is willing to undergo procedures.  Continues to have DOE.  No angina.  CT cardiac also showed bilateral pleural effusions, right larger than left.  She never underwent ultrasound thoracentesis.  She had syncopal event from taking Entresto  but after stopping Entresto , she did not have any more syncopal events.  No more dizziness.  No palpitations or leg swelling.  Current smoker.    Past Medical History:  Diagnosis Date   angioimmunoblastic lymphoma dx'd 03/2018   BCC (basal cell carcinoma of skin) 05/02/2023   left nasal crease - schedule for Mohs   BCC (basal cell carcinoma), face 05/02/2023   mid tip of nose - schedule for Mohs   Bilateral pleural effusion 04/17/2018   Endometriosis    Essential hypertension, benign    Family history of adverse reaction to anesthesia    sister stops breathing I think (04/18/2018)   Heart murmur    Mixed hyperlipidemia    Pneumonia 04/17/2018    Past Surgical History:  Procedure Laterality Date    ABDOMINAL HYSTERECTOMY  1970s/1980s   for endometriosis   Arthroscopic right shoulder surgery     AXILLARY LYMPH NODE BIOPSY Left 04/21/2018   Procedure: LEFT AXILLARY LYMPH NODE BIOPSY;  Surgeon: Stevie Herlene Righter, MD;  Location: [facility] OR;  Service: General;  Laterality: Left;   CARPAL TUNNEL RELEASE Right    GANGLION CYST EXCISION Right    IR IMAGING GUIDED PORT INSERTION  05/07/2018   IR REMOVAL TUN ACCESS W/ PORT W/O FL MOD SED  12/23/2018   MULTIPLE TOOTH EXTRACTIONS     OOPHORECTOMY  1997   PARTIAL HYSTERECTOMY  1984   Right hand surgery     SHOULDER OPEN ROTATOR CUFF REPAIR Right    VESICOVAGINAL FISTULA CLOSURE W/ TAH      Current Outpatient Medications  Medication Sig Dispense Refill   aspirin  EC 81 MG tablet Take 1 tablet (81 mg total) by mouth daily. Swallow whole. 30 tablet 12   atorvastatin  (LIPITOR) 80 MG tablet Take 1 tablet (80 mg total) by mouth daily. 90 tablet 3   dapagliflozin  propanediol (FARXIGA ) 10 MG TABS tablet Take 1 tablet (10 mg total) by mouth daily.     furosemide  (LASIX ) 20 MG tablet Take 1 tablet (20 mg total) by mouth as needed.     metoprolol  succinate (TOPROL -XL) 25 MG 24 hr tablet Take 1 tablet (25 mg total) by mouth at bedtime. Take with or immediately following a meal.  90 tablet 1   nitroGLYCERIN  (NITROSTAT ) 0.4 MG SL tablet Place 1 tablet (0.4 mg total) under the tongue every 5 (five) minutes as needed for chest pain. If a single episode of chest pain is not relieved by one tablet, the patient will try another within 5 minutes; and if this doesn't relieve the pain, the patient is instructed to call 911 for transportation to an emergency department. 25 tablet 3   No current facility-administered medications for this visit.   Allergies:  Ambien  [zolpidem ]   Social History: The patient  reports that she has been smoking cigarettes. She has a 69 pack-year smoking history. She has been exposed to tobacco smoke. She has never used smokeless tobacco. She  reports that she does not currently use alcohol. She reports that she does not currently use drugs.   Family History: The patient's family history includes Arthritis in an other family member; Asthma in an other family member; Cancer in her sister; Diabetes in her mother and another family member; Heart attack in her father; Heart disease in an other family member; Hyperlipidemia in an other family member; Hypertension in her father; Thyroid disease in her sister.   ROS:  Please see the history of present illness. Otherwise, complete review of systems is positive for none  All other systems are reviewed and negative.   Physical Exam: VS:  BP 118/70   Pulse 76   Ht 5' 2 (1.575 m)   Wt 120 lb (54.4 kg)   SpO2 97%   BMI 21.95 kg/m , BMI Body mass index is 21.95 kg/m.  Wt Readings from Last 3 Encounters:  05/20/24 120 lb (54.4 kg)  05/08/24 118 lb 6.4 oz (53.7 kg)  04/15/24 119 lb 6.4 oz (54.2 kg)    General: Patient appears comfortable at rest. HEENT: Conjunctiva and lids normal, oropharynx clear with moist mucosa. Neck: Supple, no elevated JVP or carotid bruits, no thyromegaly. Lungs: Clear to auscultation, nonlabored breathing at rest. Cardiac: Regular rate and rhythm, no S3 or significant systolic murmur, no pericardial rub. Abdomen: Soft, nontender, no hepatomegaly, bowel sounds present, no guarding or rebound. Extremities: No pitting edema, distal pulses 2+. Skin: Warm and dry. Musculoskeletal: No kyphosis. Neuropsychiatric: Alert and oriented x3, affect grossly appropriate.  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  Cardiac risk factors include nicotine  abuse.  Echocardiogram showed LVEF 35 to 40% with global hypokinesis, small circumferential pericardial effusion and no significant valvular heart disease.  CVP was 3 mmHg. CT cardiac reported bilateral pleural effusions, right larger than left.  She will benefit from ultrasound thoracentesis and LHC.  Continue aspirin  81 mg once daily, atorvastatin  80 mg nightly.  Informed consent for LHC Risks and benefits of cardiac catheterization have been discussed with the patient.  These include bleeding, infection, kidney damage, stroke, heart attack, death.  The patient understands these risks and is willing to proceed.  HLD, not at  goal: LDL 141 in May 2025.  Currently on atorvastatin  80 mg nightly.  Obtain fasting lipid panel.  Goal LDL should be less than 55 (total plaque volume is extensive).  If LDL continues to be more than 55, she is agreeable to start injectables.  Does not like to give shots to herself.  She will benefit from Inclisiran.  Chronic systolic heart failure with LVEF 35 to 40%: Ongoing DOE since May 2025.  She had 2 syncopal events after May hospitalization where she was started on Entresto .  Entresto  was eventually discontinued with no recurrence of syncopal events.  Continue current GDMT, p.o. Lasix  20 mg as needed, metoprolol  succinate 25 mg daily at bedtime and Farxiga  10 mg once daily.  Not on Entresto  due to syncopal event.  Cannot uptitrate GDMT due to soft pressures.  Bilateral pleural effusions, right larger than left on CT cardiac in June 2025: Scheduled for ultrasound thoracentesis at [facility].  She reported when she takes p.o. Lasix , her breathing gets better.  Nicotine  abuse: Current smoker, counseling provided.         Medication  Adjustments/Labs and Tests Ordered: Current medicines are reviewed at length with the patient today.  Concerns regarding medicines are outlined above.    Disposition:  Follow up keep appt in August 2025 after LHC  Signed Deborah Arleta Maywood, MD, 05/20/2024 4:48 PM    [facility] at [facility] [address] [place], [place], [place] [address]

## 2024-05-20 NOTE — Telephone Encounter (Signed)
 Checking percert on the following patient for   LHC scheduled on 07/07 @ 9:00 with Dr. Mady

## 2024-05-20 NOTE — Progress Notes (Signed)
 Cardiology Office Note  Date: 05/20/2024   ID: Deborah, Cooley Mar 25, 1956, MRN [id number]  PCP:  Adine Duwaine MATSU, FNP  Cardiologist:  Diannah SHAUNNA Maywood, MD Electrophysiologist:  None   History of Present Illness: Deborah Cooley is a 68 y.o. female   Echocardiogram in May 2025 showed LVEF 35 to 40%, new onset cardiomyopathy and CVP was 3 mmHg.  No evidence of valvular heart disease was noted.  CT cardiac was initially performed for ischemia evaluation, coronary calcium  score was noted to be 477 (93rd percentile for age and sex matched control), total plaque volume is extensive and there was noted to be subtotal proximal to mid RCA, 50 to 69% stenosis of the proximal OM1 and mid LAD.  These lesions were noted to be flow-limiting, possibly.  She was seen by Scot Ford in June 2025 where the plan was to schedule for [facility] but she wanted to hold off until her grandson graduates and joins [employer], academic.  Now that he graduated and joined [employer], she is willing to undergo procedures.  Continues to have DOE.  No angina.  CT cardiac also showed bilateral pleural effusions, right larger than left.  She never underwent ultrasound thoracentesis.  She had syncopal event from taking Entresto  but after stopping Entresto , she did not have any more syncopal events.  No more dizziness.  No palpitations or leg swelling.  Current smoker.    Past Medical History:  Diagnosis Date   angioimmunoblastic lymphoma dx'd 03/2018   BCC (basal cell carcinoma of skin) 05/02/2023   left nasal crease - schedule for Mohs   BCC (basal cell carcinoma), face 05/02/2023   mid tip of nose - schedule for Mohs   Bilateral pleural effusion 04/17/2018   Endometriosis    Essential hypertension, benign    Family history of adverse reaction to anesthesia    sister stops breathing I think (04/18/2018)   Heart murmur    Mixed hyperlipidemia    Pneumonia 04/17/2018    Past Surgical History:  Procedure Laterality Date    ABDOMINAL HYSTERECTOMY  1970s/1980s   for endometriosis   Arthroscopic right shoulder surgery     AXILLARY LYMPH NODE BIOPSY Left 04/21/2018   Procedure: LEFT AXILLARY LYMPH NODE BIOPSY;  Surgeon: Stevie Herlene Righter, MD;  Location: [facility] OR;  Service: General;  Laterality: Left;   CARPAL TUNNEL RELEASE Right    GANGLION CYST EXCISION Right    IR IMAGING GUIDED PORT INSERTION  05/07/2018   IR REMOVAL TUN ACCESS W/ PORT W/O FL MOD SED  12/23/2018   MULTIPLE TOOTH EXTRACTIONS     OOPHORECTOMY  1997   PARTIAL HYSTERECTOMY  1984   Right hand surgery     SHOULDER OPEN ROTATOR CUFF REPAIR Right    VESICOVAGINAL FISTULA CLOSURE W/ TAH      Current Outpatient Medications  Medication Sig Dispense Refill   aspirin  EC 81 MG tablet Take 1 tablet (81 mg total) by mouth daily. Swallow whole. 30 tablet 12   atorvastatin  (LIPITOR) 80 MG tablet Take 1 tablet (80 mg total) by mouth daily. 90 tablet 3   dapagliflozin  propanediol (FARXIGA ) 10 MG TABS tablet Take 1 tablet (10 mg total) by mouth daily.     furosemide  (LASIX ) 20 MG tablet Take 1 tablet (20 mg total) by mouth as needed.     metoprolol  succinate (TOPROL -XL) 25 MG 24 hr tablet Take 1 tablet (25 mg total) by mouth at bedtime. Take with or immediately following a meal.  90 tablet 1   nitroGLYCERIN  (NITROSTAT ) 0.4 MG SL tablet Place 1 tablet (0.4 mg total) under the tongue every 5 (five) minutes as needed for chest pain. If a single episode of chest pain is not relieved by one tablet, the patient will try another within 5 minutes; and if this doesn't relieve the pain, the patient is instructed to call 911 for transportation to an emergency department. 25 tablet 3   No current facility-administered medications for this visit.   Allergies:  Ambien  [zolpidem ]   Social History: The patient  reports that she has been smoking cigarettes. She has a 69 pack-year smoking history. She has been exposed to tobacco smoke. She has never used smokeless tobacco. She  reports that she does not currently use alcohol. She reports that she does not currently use drugs.   Family History: The patient's family history includes Arthritis in an other family member; Asthma in an other family member; Cancer in her sister; Diabetes in her mother and another family member; Heart attack in her father; Heart disease in an other family member; Hyperlipidemia in an other family member; Hypertension in her father; Thyroid disease in her sister.   ROS:  Please see the history of present illness. Otherwise, complete review of systems is positive for none  All other systems are reviewed and negative.   Physical Exam: VS:  BP 118/70   Pulse 76   Ht 5' 2 (1.575 m)   Wt 120 lb (54.4 kg)   SpO2 97%   BMI 21.95 kg/m , BMI Body mass index is 21.95 kg/m.  Wt Readings from Last 3 Encounters:  05/20/24 120 lb (54.4 kg)  05/08/24 118 lb 6.4 oz (53.7 kg)  04/15/24 119 lb 6.4 oz (54.2 kg)    General: Patient appears comfortable at rest. HEENT: Conjunctiva and lids normal, oropharynx clear with moist mucosa. Neck: Supple, no elevated JVP or carotid bruits, no thyromegaly. Lungs: Clear to auscultation, nonlabored breathing at rest. Cardiac: Regular rate and rhythm, no S3 or significant systolic murmur, no pericardial rub. Abdomen: Soft, nontender, no hepatomegaly, bowel sounds present, no guarding or rebound. Extremities: No pitting edema, distal pulses 2+. Skin: Warm and dry. Musculoskeletal: No kyphosis. Neuropsychiatric: Alert and oriented x3, affect grossly appropriate.  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  Cardiac risk factors include nicotine  abuse.  Echocardiogram showed LVEF 35 to 40% with global hypokinesis, small circumferential pericardial effusion and no significant valvular heart disease.  CVP was 3 mmHg. CT cardiac reported bilateral pleural effusions, right larger than left.  She will benefit from ultrasound thoracentesis and LHC.  Continue aspirin  81 mg once daily, atorvastatin  80 mg nightly.  Informed consent for LHC Risks and benefits of cardiac catheterization have been discussed with the patient.  These include bleeding, infection, kidney damage, stroke, heart attack, death.  The patient understands these risks and is willing to proceed.  HLD, not at  goal: LDL 141 in May 2025.  Currently on atorvastatin  80 mg nightly.  Obtain fasting lipid panel.  Goal LDL should be less than 55 (total plaque volume is extensive).  If LDL continues to be more than 55, she is agreeable to start injectables.  Does not like to give shots to herself.  She will benefit from Inclisiran.  Chronic systolic heart failure with LVEF 35 to 40%: Ongoing DOE since May 2025.  She had 2 syncopal events after May hospitalization where she was started on Entresto .  Entresto  was eventually discontinued with no recurrence of syncopal events.  Continue current GDMT, p.o. Lasix  20 mg as needed, metoprolol  succinate 25 mg daily at bedtime and Farxiga  10 mg once daily.  Not on Entresto  due to syncopal event.  Cannot uptitrate GDMT due to soft pressures.  Bilateral pleural effusions, right larger than left on CT cardiac in June 2025: Scheduled for ultrasound thoracentesis at [facility].  She reported when she takes p.o. Lasix , her breathing gets better.  Nicotine  abuse: Current smoker, counseling provided.         Medication  Adjustments/Labs and Tests Ordered: Current medicines are reviewed at length with the patient today.  Concerns regarding medicines are outlined above.    Disposition:  Follow up keep appt in August 2025 after LHC  Signed Diannah Arleta Maywood, MD, 05/20/2024 4:48 PM    [facility] at [facility] [address] [place], [place], [place] [address]

## 2024-05-20 NOTE — Patient Instructions (Addendum)
 Medication Instructions:  Your physician recommends that you continue on your current medications as directed. Please refer to the Current Medication list given to you today.   Labwork: Fasting Lipid Panel to be completed at [facility] and BMET tomorrow at [facility]  Testing/Procedures:  [facility] A DEPT OF [place]. [facility] [place] [facility] [address] [place] [address] Dept: [phone] Loc: [phone]  [place]IN CANTERO  05/20/2024  You are scheduled for a Cardiac Catheterization on Monday, July 7 with Dr. Lonni End.  1. Please arrive at the [facility] (Main Entrance A) at [facility]: [address] [place], [place] [address] at 7:00 AM (This time is 2 hour(s) before your procedure to ensure your preparation).   Free valet parking service is available. You will check in at ADMITTING. The support person will be asked to wait in the waiting room.  It is OK to have someone drop you off and come back when you are ready to be discharged.    Special note: Every effort is made to have your procedure done on time. Please understand that emergencies sometimes delay scheduled procedures.  2. Diet: Do not eat solid foods after midnight.  The patient may have clear liquids until 5am upon the day of the procedure.  3. Labs: You will need to have blood drawn on , July 3 at  [facility].   4. Medication instructions in preparation for your procedure:   Contrast Allergy: No   Stop taking, Lasix  (Furosemide )  Monday, July 7,   On the morning of your procedure, take your Aspirin  81 mg and any morning medicines NOT listed above.  You may use sips of water.  5. Plan to go home the same day, you will only stay overnight if medically necessary. 6. Bring a current list of your medications and current insurance cards. 7. You MUST have a responsible person to drive you home. 8. Someone MUST be with you the first 24  hours after you arrive home or your discharge will be delayed. 9. Please wear clothes that are easy to get on and off and wear slip-on shoes.  Thank you for allowing us  to care for you!   -- [facility]   Ultrasound Thoracentesis  Follow-Up: Your physician recommends that you schedule a follow-up appointment in: Keep Scheduled appointment   Any Other Special Instructions Will Be Listed Below (If Applicable). Thank you for choosing [facility]!     If you need a refill on your cardiac medications before your next appointment, please call your pharmacy.

## 2024-05-21 ENCOUNTER — Other Ambulatory Visit (HOSPITAL_COMMUNITY)
Admission: RE | Admit: 2024-05-21 | Discharge: 2024-05-21 | Disposition: A | Discharge status: Home / Self Care | Source: Ambulatory Visit | Attending: Internal Medicine | Admitting: Internal Medicine

## 2024-05-21 ENCOUNTER — Telehealth: Payer: Self-pay | Admitting: *Deleted

## 2024-05-21 DIAGNOSIS — E785 Hyperlipidemia, unspecified: Secondary | ICD-10-CM | POA: Diagnosis present

## 2024-05-21 DIAGNOSIS — Z01810 Encounter for preprocedural cardiovascular examination: Secondary | ICD-10-CM | POA: Diagnosis present

## 2024-05-21 LAB — CBC
HCT: 37.8 % (ref 36.0–46.0)
Hemoglobin: 12.1 g/dL (ref 12.0–15.0)
MCH: 27.4 pg (ref 26.0–34.0)
MCHC: 32 g/dL (ref 30.0–36.0)
MCV: 85.7 fL (ref 80.0–100.0)
Platelets: 196 10*3/uL (ref 150–400)
RBC: 4.41 MIL/uL (ref 3.87–5.11)
RDW: 16.4 % — ABNORMAL HIGH (ref 11.5–15.5)
WBC: 7.3 10*3/uL (ref 4.0–10.5)
nRBC: 0 % (ref 0.0–0.2)

## 2024-05-21 LAB — BASIC METABOLIC PANEL WITH GFR
Anion gap: 10 (ref 5–15)
BUN: 15 mg/dL (ref 8–23)
CO2: 26 mmol/L (ref 22–32)
Calcium: 9.1 mg/dL (ref 8.9–10.3)
Chloride: 100 mmol/L (ref 98–111)
Creatinine, Ser: 1.03 mg/dL — ABNORMAL HIGH (ref 0.44–1.00)
GFR, Estimated: 59 mL/min — ABNORMAL LOW (ref >60)
Glucose, Bld: 140 mg/dL — ABNORMAL HIGH (ref 70–99)
Potassium: 3.6 mmol/L (ref 3.5–5.1)
Sodium: 136 mmol/L (ref 135–145)

## 2024-05-21 LAB — LIPID PANEL
Cholesterol: 102 mg/dL (ref 0–200)
HDL: 33 mg/dL — ABNORMAL LOW (ref >40)
LDL Cholesterol: 53 mg/dL (ref 0–99)
Total CHOL/HDL Ratio: 3.1 ratio
Triglycerides: 78 mg/dL (ref <150)
VLDL: 16 mg/dL (ref 0–40)

## 2024-05-21 NOTE — Telephone Encounter (Addendum)
 Cardiac Catheterization scheduled at [facility] for: Monday May 25, 2024 9 AM Arrival time [facility] Main Entrance A at: 7 AM  Nothing to eat after midnight prior to procedure, clear liquids until 5 AM day of procedure.  Medication instructions: -Hold:  Lasix /Farxiga -AM of procedure -Other usual morning medications can be taken with sips of water including aspirin  81 mg.  Plan to go home the same day, you will only stay overnight if medically necessary.  You must have responsible adult to drive you home.  Someone must be with you the first 24 hours after you arrive home.  Reviewed procedure instructions with patient

## 2024-05-25 ENCOUNTER — Ambulatory Visit: Payer: Self-pay | Admitting: Internal Medicine

## 2024-05-25 ENCOUNTER — Encounter (HOSPITAL_COMMUNITY)
Admission: RE | Disposition: A | Discharge status: Home / Self Care | Payer: Self-pay | Source: Home / Self Care | Attending: Internal Medicine

## 2024-05-25 ENCOUNTER — Other Ambulatory Visit: Payer: Self-pay

## 2024-05-25 ENCOUNTER — Ambulatory Visit (HOSPITAL_COMMUNITY)
Admission: RE | Admit: 2024-05-25 | Discharge: 2024-05-25 | Disposition: A | Discharge status: Home / Self Care | Attending: Internal Medicine | Admitting: Internal Medicine

## 2024-05-25 DIAGNOSIS — I429 Cardiomyopathy, unspecified: Secondary | ICD-10-CM | POA: Insufficient documentation

## 2024-05-25 DIAGNOSIS — Z79899 Other long term (current) drug therapy: Secondary | ICD-10-CM | POA: Insufficient documentation

## 2024-05-25 DIAGNOSIS — F17210 Nicotine dependence, cigarettes, uncomplicated: Secondary | ICD-10-CM | POA: Insufficient documentation

## 2024-05-25 DIAGNOSIS — I255 Ischemic cardiomyopathy: Secondary | ICD-10-CM | POA: Insufficient documentation

## 2024-05-25 DIAGNOSIS — F17200 Nicotine dependence, unspecified, uncomplicated: Secondary | ICD-10-CM | POA: Insufficient documentation

## 2024-05-25 DIAGNOSIS — Z7982 Long term (current) use of aspirin: Secondary | ICD-10-CM | POA: Diagnosis not present

## 2024-05-25 DIAGNOSIS — R931 Abnormal findings on diagnostic imaging of heart and coronary circulation: Secondary | ICD-10-CM | POA: Insufficient documentation

## 2024-05-25 DIAGNOSIS — I2582 Chronic total occlusion of coronary artery: Secondary | ICD-10-CM | POA: Diagnosis not present

## 2024-05-25 DIAGNOSIS — R079 Chest pain, unspecified: Secondary | ICD-10-CM | POA: Diagnosis present

## 2024-05-25 DIAGNOSIS — R0602 Shortness of breath: Secondary | ICD-10-CM | POA: Insufficient documentation

## 2024-05-25 DIAGNOSIS — E782 Mixed hyperlipidemia: Secondary | ICD-10-CM | POA: Insufficient documentation

## 2024-05-25 DIAGNOSIS — I2510 Atherosclerotic heart disease of native coronary artery without angina pectoris: Secondary | ICD-10-CM | POA: Diagnosis not present

## 2024-05-25 DIAGNOSIS — I428 Other cardiomyopathies: Secondary | ICD-10-CM | POA: Insufficient documentation

## 2024-05-25 DIAGNOSIS — I5022 Chronic systolic (congestive) heart failure: Secondary | ICD-10-CM | POA: Insufficient documentation

## 2024-05-25 HISTORY — PX: LEFT HEART CATH AND CORONARY ANGIOGRAPHY: CATH118249

## 2024-05-25 SURGERY — LEFT HEART CATH AND CORONARY ANGIOGRAPHY
Anesthesia: LOCAL

## 2024-05-25 MED ORDER — HEPARIN SODIUM (PORCINE) 1000 UNIT/ML IJ SOLN
INTRAMUSCULAR | Status: DC | PRN
Start: 2024-05-25 — End: 2024-05-25
  Administered 2024-05-25: 3000 [IU] via INTRAVENOUS

## 2024-05-25 MED ORDER — HYDRALAZINE HCL 20 MG/ML IJ SOLN: 10.0000 mg | INTRAMUSCULAR | Status: DC | PRN

## 2024-05-25 MED ORDER — ISOSORBIDE MONONITRATE ER 30 MG PO TB24: 15.0000 mg | ORAL_TABLET | Freq: Every day | ORAL | 5 refills | Status: DC

## 2024-05-25 MED ORDER — ONDANSETRON HCL 4 MG/2ML IJ SOLN: 4.0000 mg | Freq: Four times a day (QID) | INTRAMUSCULAR | Status: DC | PRN

## 2024-05-25 MED ORDER — SODIUM CHLORIDE 0.9 % IV SOLN: 250.0000 mL | INTRAVENOUS | Status: DC | PRN

## 2024-05-25 MED ORDER — SODIUM CHLORIDE 0.9% FLUSH: 3.0000 mL | Freq: Two times a day (BID) | INTRAVENOUS | Status: DC

## 2024-05-25 MED ORDER — FENTANYL CITRATE (PF) 100 MCG/2ML IJ SOLN
INTRAMUSCULAR | Status: AC
  Filled 2024-05-25: qty 2

## 2024-05-25 MED ORDER — ASPIRIN 81 MG PO CHEW: 81.0000 mg | CHEWABLE_TABLET | ORAL | Status: DC

## 2024-05-25 MED ORDER — MIDAZOLAM HCL 2 MG/2ML IJ SOLN
INTRAMUSCULAR | Status: DC | PRN
  Administered 2024-05-25: 1 mg via INTRAVENOUS

## 2024-05-25 MED ORDER — HEPARIN SODIUM (PORCINE) 1000 UNIT/ML IJ SOLN
INTRAMUSCULAR | Status: AC
  Filled 2024-05-25: qty 10

## 2024-05-25 MED ORDER — SODIUM CHLORIDE 0.9% FLUSH: 3.0000 mL | INTRAVENOUS | Status: DC | PRN

## 2024-05-25 MED ORDER — SODIUM CHLORIDE 0.9 % IV SOLN: INTRAVENOUS | Status: DC

## 2024-05-25 MED ORDER — IOHEXOL 350 MG/ML SOLN
INTRAVENOUS | Status: DC | PRN
Start: 2024-05-25 — End: 2024-05-25
  Administered 2024-05-25: 24 mL

## 2024-05-25 MED ORDER — MIDAZOLAM HCL 2 MG/2ML IJ SOLN
INTRAMUSCULAR | Status: AC
  Filled 2024-05-25: qty 2

## 2024-05-25 MED ORDER — HEPARIN (PORCINE) IN NACL 2-0.9 UNITS/ML
INTRAMUSCULAR | Status: DC | PRN
  Administered 2024-05-25: 10 mL via INTRA_ARTERIAL

## 2024-05-25 MED ORDER — FENTANYL CITRATE (PF) 100 MCG/2ML IJ SOLN
INTRAMUSCULAR | Status: DC | PRN
  Administered 2024-05-25: 25 ug via INTRAVENOUS

## 2024-05-25 MED ORDER — LIDOCAINE HCL (PF) 1 % IJ SOLN
INTRAMUSCULAR | Status: DC | PRN
  Administered 2024-05-25: 2 mL

## 2024-05-25 MED ORDER — FUROSEMIDE 20 MG PO TABS: 20.0000 mg | ORAL_TABLET | Freq: Every day | ORAL | Status: DC

## 2024-05-25 MED ORDER — ACETAMINOPHEN 325 MG PO TABS: 650.0000 mg | ORAL_TABLET | ORAL | Status: DC | PRN

## 2024-05-25 MED ORDER — FUROSEMIDE 20 MG PO TABS: 20.0000 mg | ORAL_TABLET | Freq: Every day | ORAL | 5 refills | Status: DC

## 2024-05-25 MED ORDER — VERAPAMIL HCL 2.5 MG/ML IV SOLN
INTRAVENOUS | Status: AC
  Filled 2024-05-25: qty 2

## 2024-05-25 MED ORDER — HEPARIN (PORCINE) IN NACL 2000-0.9 UNIT/L-% IV SOLN
INTRAVENOUS | Status: DC | PRN
  Administered 2024-05-25: 1000 mL

## 2024-05-25 SURGICAL SUPPLY — 7 items
CATH 5FR JL3.5 JR4 ANG PIG MP (CATHETERS) IMPLANT
DEVICE RAD TR BAND REGULAR (VASCULAR PRODUCTS) IMPLANT
GLIDESHEATH SLEND SS 6F .021 (SHEATH) IMPLANT
GUIDEWIRE INQWIRE 1.5J.035X260 (WIRE) IMPLANT
PACK CARDIAC CATHETERIZATION (CUSTOM PROCEDURE TRAY) ×1 IMPLANT
SET ATX-X65L (MISCELLANEOUS) IMPLANT
WIRE HI TORQ VERSACORE-J 145CM (WIRE) IMPLANT

## 2024-05-25 NOTE — Interval H&P Note (Signed)
 History and Physical Interval Note:  05/25/2024 9:12 AM  Deborah Cooley  has presented today for surgery, with the diagnosis of chest pressure, shortness of breath, cardiomyopathy, and abnormal cardiac CTA.  The various methods of treatment have been discussed with the patient and family. After consideration of risks, benefits and other options for treatment, the patient has consented to  Procedure(s): LEFT HEART CATH AND CORONARY ANGIOGRAPHY (N/A) as a surgical intervention.  The patient's history has been reviewed, patient examined, no change in status, stable for surgery.  I have reviewed the patient's chart and labs.  Questions were answered to the patient's satisfaction.    Cath Lab Visit (complete for each Cath Lab visit)  Clinical Evaluation Leading to the Procedure:   ACS: No.  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

## 2024-05-26 ENCOUNTER — Encounter (HOSPITAL_COMMUNITY): Payer: Self-pay | Admitting: Internal Medicine

## 2024-05-26 ENCOUNTER — Telehealth: Payer: Self-pay | Admitting: Internal Medicine

## 2024-05-26 NOTE — Telephone Encounter (Signed)
 Spoke with patient about thoracentesis.  States that she prefers not to do this right now.  Prefers to try medications that has been prescribed first.  Stated that she did not get stents like she thought she would.  Explained what the Lasix  & Imdur  is for.  She has Echo scheduled 06/22/24 & next office visit with Mallipeddi is scheduled for 07/17/24.  States that she would rather try medications first & does not want to get stuck in the chest with a needle unless this is absolutely necessary.

## 2024-05-26 NOTE — Telephone Encounter (Signed)
 Patient has questions about the Thoracentesis procedure that Dr. Mallipeddi ordered on her.

## 2024-05-27 NOTE — Telephone Encounter (Signed)
 The patient has been notified of the result and verbalized understanding.  All questions (if any) were answered. Littie CHRISTELLA Croak, CMA 05/27/2024 9:55 AM

## 2024-05-27 NOTE — Telephone Encounter (Signed)
-----   Message from Vishnu P Mallipeddi sent at 05/25/2024  4:27 PM EDT ----- LDL 53.  Continue current statin.  At goal. ----- Message ----- From: Rebecka, Lab [facility] [place] Sent: 05/21/2024   9:34 AM EDT To: Vishnu P Mallipeddi, MD

## 2024-05-28 NOTE — Telephone Encounter (Signed)
 Patient notified via detailed voice message.  Thoracentesis cancelled today.   Will forward to Dr. Mallipeddi cma for notification.

## 2024-05-29 ENCOUNTER — Ambulatory Visit (HOSPITAL_COMMUNITY): Admission: RE | Admit: 2024-05-29 | Source: Ambulatory Visit

## 2024-06-11 ENCOUNTER — Telehealth: Payer: Self-pay

## 2024-06-11 NOTE — Telephone Encounter (Signed)
 Received a fax notice in the [facility] que that FARXIGA  shipment should arrive within 7-10 business days (see chart media).

## 2024-06-16 ENCOUNTER — Emergency Department (HOSPITAL_COMMUNITY)

## 2024-06-16 ENCOUNTER — Inpatient Hospital Stay (HOSPITAL_COMMUNITY)
Admission: EM | Admit: 2024-06-16 | Discharge: 2024-06-20 | DRG: 291 | Disposition: A | Discharge status: Home / Self Care | Attending: Internal Medicine | Admitting: Internal Medicine

## 2024-06-16 ENCOUNTER — Other Ambulatory Visit: Payer: Self-pay

## 2024-06-16 ENCOUNTER — Encounter (HOSPITAL_COMMUNITY): Payer: Self-pay | Admitting: Emergency Medicine

## 2024-06-16 DIAGNOSIS — Z85828 Personal history of other malignant neoplasm of skin: Secondary | ICD-10-CM

## 2024-06-16 DIAGNOSIS — I2489 Other forms of acute ischemic heart disease: Secondary | ICD-10-CM | POA: Diagnosis present

## 2024-06-16 DIAGNOSIS — Z885 Allergy status to narcotic agent status: Secondary | ICD-10-CM

## 2024-06-16 DIAGNOSIS — R55 Syncope and collapse: Secondary | ICD-10-CM | POA: Diagnosis not present

## 2024-06-16 DIAGNOSIS — E1122 Type 2 diabetes mellitus with diabetic chronic kidney disease: Secondary | ICD-10-CM | POA: Diagnosis present

## 2024-06-16 DIAGNOSIS — I5023 Acute on chronic systolic (congestive) heart failure: Secondary | ICD-10-CM | POA: Diagnosis present

## 2024-06-16 DIAGNOSIS — I700 Atherosclerosis of aorta: Secondary | ICD-10-CM | POA: Diagnosis present

## 2024-06-16 DIAGNOSIS — N1831 Chronic kidney disease, stage 3a: Secondary | ICD-10-CM | POA: Diagnosis present

## 2024-06-16 DIAGNOSIS — J449 Chronic obstructive pulmonary disease, unspecified: Secondary | ICD-10-CM | POA: Diagnosis present

## 2024-06-16 DIAGNOSIS — Z833 Family history of diabetes mellitus: Secondary | ICD-10-CM

## 2024-06-16 DIAGNOSIS — J90 Pleural effusion, not elsewhere classified: Secondary | ICD-10-CM | POA: Diagnosis not present

## 2024-06-16 DIAGNOSIS — E782 Mixed hyperlipidemia: Secondary | ICD-10-CM | POA: Diagnosis present

## 2024-06-16 DIAGNOSIS — C859A Non-Hodgkin lymphoma, unspecified, in remission: Secondary | ICD-10-CM | POA: Diagnosis present

## 2024-06-16 DIAGNOSIS — Z803 Family history of malignant neoplasm of breast: Secondary | ICD-10-CM

## 2024-06-16 DIAGNOSIS — G4700 Insomnia, unspecified: Secondary | ICD-10-CM | POA: Diagnosis present

## 2024-06-16 DIAGNOSIS — E875 Hyperkalemia: Secondary | ICD-10-CM | POA: Diagnosis not present

## 2024-06-16 DIAGNOSIS — Z90711 Acquired absence of uterus with remaining cervical stump: Secondary | ICD-10-CM

## 2024-06-16 DIAGNOSIS — I428 Other cardiomyopathies: Secondary | ICD-10-CM | POA: Diagnosis present

## 2024-06-16 DIAGNOSIS — I2510 Atherosclerotic heart disease of native coronary artery without angina pectoris: Secondary | ICD-10-CM | POA: Diagnosis present

## 2024-06-16 DIAGNOSIS — E1165 Type 2 diabetes mellitus with hyperglycemia: Secondary | ICD-10-CM | POA: Diagnosis present

## 2024-06-16 DIAGNOSIS — Z8249 Family history of ischemic heart disease and other diseases of the circulatory system: Secondary | ICD-10-CM

## 2024-06-16 DIAGNOSIS — Z7982 Long term (current) use of aspirin: Secondary | ICD-10-CM

## 2024-06-16 DIAGNOSIS — F17210 Nicotine dependence, cigarettes, uncomplicated: Secondary | ICD-10-CM | POA: Diagnosis present

## 2024-06-16 DIAGNOSIS — F419 Anxiety disorder, unspecified: Secondary | ICD-10-CM | POA: Diagnosis present

## 2024-06-16 DIAGNOSIS — I130 Hypertensive heart and chronic kidney disease with heart failure and stage 1 through stage 4 chronic kidney disease, or unspecified chronic kidney disease: Secondary | ICD-10-CM | POA: Diagnosis present

## 2024-06-16 DIAGNOSIS — R002 Palpitations: Secondary | ICD-10-CM | POA: Diagnosis present

## 2024-06-16 DIAGNOSIS — I3139 Other pericardial effusion (noninflammatory): Secondary | ICD-10-CM | POA: Diagnosis present

## 2024-06-16 DIAGNOSIS — Z720 Tobacco use: Secondary | ICD-10-CM | POA: Diagnosis present

## 2024-06-16 DIAGNOSIS — E876 Hypokalemia: Secondary | ICD-10-CM | POA: Diagnosis present

## 2024-06-16 DIAGNOSIS — Z888 Allergy status to other drugs, medicaments and biological substances status: Secondary | ICD-10-CM

## 2024-06-16 DIAGNOSIS — Z8349 Family history of other endocrine, nutritional and metabolic diseases: Secondary | ICD-10-CM

## 2024-06-16 DIAGNOSIS — Z87440 Personal history of urinary (tract) infections: Secondary | ICD-10-CM

## 2024-06-16 DIAGNOSIS — Z79899 Other long term (current) drug therapy: Secondary | ICD-10-CM

## 2024-06-16 DIAGNOSIS — E871 Hypo-osmolality and hyponatremia: Secondary | ICD-10-CM | POA: Diagnosis present

## 2024-06-16 DIAGNOSIS — Z825 Family history of asthma and other chronic lower respiratory diseases: Secondary | ICD-10-CM

## 2024-06-16 DIAGNOSIS — I509 Heart failure, unspecified: Principal | ICD-10-CM

## 2024-06-16 DIAGNOSIS — I255 Ischemic cardiomyopathy: Secondary | ICD-10-CM | POA: Diagnosis present

## 2024-06-16 DIAGNOSIS — R000 Tachycardia, unspecified: Secondary | ICD-10-CM | POA: Diagnosis present

## 2024-06-16 DIAGNOSIS — I5043 Acute on chronic combined systolic (congestive) and diastolic (congestive) heart failure: Secondary | ICD-10-CM | POA: Diagnosis not present

## 2024-06-16 DIAGNOSIS — I5A Non-ischemic myocardial injury (non-traumatic): Secondary | ICD-10-CM | POA: Diagnosis not present

## 2024-06-16 LAB — URINALYSIS, ROUTINE W REFLEX MICROSCOPIC
Bacteria, UA: NONE SEEN
Bilirubin Urine: NEGATIVE
Glucose, UA: >=500 mg/dL — AB
Hgb urine dipstick: NEGATIVE
Ketones, ur: NEGATIVE mg/dL
Leukocytes,Ua: NEGATIVE
Nitrite: NEGATIVE
Protein, ur: 30 mg/dL — AB
Specific Gravity, Urine: 1.015 (ref 1.005–1.030)
pH: 6 (ref 5.0–8.0)

## 2024-06-16 LAB — CBC WITH DIFFERENTIAL/PLATELET
Abs Immature Granulocytes: 0.02 K/uL (ref 0.00–0.07)
Basophils Absolute: 0 K/uL (ref 0.0–0.1)
Basophils Relative: 1 %
Eosinophils Absolute: 0.1 K/uL (ref 0.0–0.5)
Eosinophils Relative: 1 %
HCT: 41 % (ref 36.0–46.0)
Hemoglobin: 12.9 g/dL (ref 12.0–15.0)
Immature Granulocytes: 0 %
Lymphocytes Relative: 25 %
Lymphs Abs: 2.1 K/uL (ref 0.7–4.0)
MCH: 25.8 pg — ABNORMAL LOW (ref 26.0–34.0)
MCHC: 31.5 g/dL (ref 30.0–36.0)
MCV: 82 fL (ref 80.0–100.0)
Monocytes Absolute: 0.7 K/uL (ref 0.1–1.0)
Monocytes Relative: 8 %
Neutro Abs: 5.4 K/uL (ref 1.7–7.7)
Neutrophils Relative %: 65 %
Platelets: 185 K/uL (ref 150–400)
RBC: 5 MIL/uL (ref 3.87–5.11)
RDW: 17 % — ABNORMAL HIGH (ref 11.5–15.5)
WBC: 8.3 K/uL (ref 4.0–10.5)
nRBC: 0 % (ref 0.0–0.2)

## 2024-06-16 LAB — COMPREHENSIVE METABOLIC PANEL WITH GFR
ALT: 25 U/L (ref 0–44)
AST: 31 U/L (ref 15–41)
Albumin: 4 g/dL (ref 3.5–5.0)
Alkaline Phosphatase: 57 U/L (ref 38–126)
Anion gap: 14 (ref 5–15)
BUN: 14 mg/dL (ref 8–23)
CO2: 25 mmol/L (ref 22–32)
Calcium: 9.8 mg/dL (ref 8.9–10.3)
Chloride: 94 mmol/L — ABNORMAL LOW (ref 98–111)
Creatinine, Ser: 1.01 mg/dL — ABNORMAL HIGH (ref 0.44–1.00)
GFR, Estimated: >60 mL/min
Glucose, Bld: 177 mg/dL — ABNORMAL HIGH (ref 70–99)
Potassium: 3.3 mmol/L — ABNORMAL LOW (ref 3.5–5.1)
Sodium: 133 mmol/L — ABNORMAL LOW (ref 135–145)
Total Bilirubin: 1.6 mg/dL — ABNORMAL HIGH (ref 0.0–1.2)
Total Protein: 6.8 g/dL (ref 6.5–8.1)

## 2024-06-16 LAB — TROPONIN I (HIGH SENSITIVITY)
Troponin I (High Sensitivity): 44 ng/L — ABNORMAL HIGH
Troponin I (High Sensitivity): 36 ng/L — ABNORMAL HIGH (ref <18)

## 2024-06-16 LAB — BRAIN NATRIURETIC PEPTIDE: B Natriuretic Peptide: 2829 pg/mL — ABNORMAL HIGH (ref 0.0–100.0)

## 2024-06-16 LAB — MAGNESIUM: Magnesium: 2.1 mg/dL (ref 1.7–2.4)

## 2024-06-16 MED ORDER — IOHEXOL 350 MG/ML SOLN
75.0000 mL | Freq: Once | INTRAVENOUS | Status: AC | PRN
  Administered 2024-06-16: 75 mL via INTRAVENOUS

## 2024-06-16 MED ORDER — IPRATROPIUM-ALBUTEROL 0.5-2.5 (3) MG/3ML IN SOLN
3.0000 mL | Freq: Once | RESPIRATORY_TRACT | Status: AC
  Administered 2024-06-16: 3 mL via RESPIRATORY_TRACT
  Filled 2024-06-16: qty 3

## 2024-06-16 MED ORDER — ONDANSETRON HCL 4 MG/2ML IJ SOLN: 4.0000 mg | Freq: Four times a day (QID) | INTRAMUSCULAR | Status: DC | PRN

## 2024-06-16 MED ORDER — ENOXAPARIN SODIUM 40 MG/0.4ML IJ SOSY
40.0000 mg | PREFILLED_SYRINGE | INTRAMUSCULAR | Status: DC
  Administered 2024-06-16 – 2024-06-17 (×2): 40 mg via SUBCUTANEOUS
  Filled 2024-06-16 (×2): qty 0.4

## 2024-06-16 MED ORDER — METOPROLOL SUCCINATE ER 25 MG PO TB24
25.0000 mg | ORAL_TABLET | Freq: Every day | ORAL | Status: DC
  Administered 2024-06-17: 25 mg via ORAL
  Filled 2024-06-16 (×2): qty 1

## 2024-06-16 MED ORDER — IPRATROPIUM-ALBUTEROL 0.5-2.5 (3) MG/3ML IN SOLN
3.0000 mL | Freq: Three times a day (TID) | RESPIRATORY_TRACT | Status: DC
  Administered 2024-06-16: 3 mL via RESPIRATORY_TRACT
  Filled 2024-06-16: qty 3

## 2024-06-16 MED ORDER — SODIUM CHLORIDE 0.9% FLUSH: 3.0000 mL | INTRAVENOUS | Status: DC | PRN

## 2024-06-16 MED ORDER — ISOSORBIDE MONONITRATE ER 30 MG PO TB24
15.0000 mg | ORAL_TABLET | Freq: Every day | ORAL | Status: DC
  Administered 2024-06-16 – 2024-06-19 (×4): 15 mg via ORAL
  Filled 2024-06-16 (×4): qty 1

## 2024-06-16 MED ORDER — IPRATROPIUM-ALBUTEROL 0.5-2.5 (3) MG/3ML IN SOLN
3.0000 mL | Freq: Three times a day (TID) | RESPIRATORY_TRACT | Status: DC
  Administered 2024-06-17: 3 mL via RESPIRATORY_TRACT
  Filled 2024-06-16: qty 3

## 2024-06-16 MED ORDER — METHYLPREDNISOLONE SODIUM SUCC 125 MG IJ SOLR
125.0000 mg | Freq: Once | INTRAMUSCULAR | Status: AC
  Administered 2024-06-16: 125 mg via INTRAVENOUS
  Filled 2024-06-16: qty 2

## 2024-06-16 MED ORDER — SODIUM CHLORIDE 0.9 % IV SOLN
250.0000 mL | INTRAVENOUS | Status: AC | PRN
Start: 2024-06-16 — End: 2024-06-17

## 2024-06-16 MED ORDER — NICOTINE 14 MG/24HR TD PT24
14.0000 mg | MEDICATED_PATCH | Freq: Every day | TRANSDERMAL | Status: DC
  Administered 2024-06-16 – 2024-06-20 (×5): 14 mg via TRANSDERMAL
  Filled 2024-06-16 (×5): qty 1

## 2024-06-16 MED ORDER — BUDESONIDE 0.5 MG/2ML IN SUSP
0.5000 mg | Freq: Two times a day (BID) | RESPIRATORY_TRACT | Status: DC
  Administered 2024-06-16 – 2024-06-20 (×8): 0.5 mg via RESPIRATORY_TRACT
  Filled 2024-06-16 (×8): qty 2

## 2024-06-16 MED ORDER — ACETAMINOPHEN 325 MG PO TABS: 650.0000 mg | ORAL_TABLET | ORAL | Status: DC | PRN

## 2024-06-16 MED ORDER — ASPIRIN 81 MG PO TBEC
81.0000 mg | DELAYED_RELEASE_TABLET | Freq: Every day | ORAL | Status: DC
  Administered 2024-06-16 – 2024-06-20 (×5): 81 mg via ORAL
  Filled 2024-06-16 (×5): qty 1

## 2024-06-16 MED ORDER — ARFORMOTEROL TARTRATE 15 MCG/2ML IN NEBU
15.0000 ug | INHALATION_SOLUTION | Freq: Two times a day (BID) | RESPIRATORY_TRACT | Status: DC
  Administered 2024-06-16 – 2024-06-20 (×8): 15 ug via RESPIRATORY_TRACT
  Filled 2024-06-16 (×8): qty 2

## 2024-06-16 MED ORDER — SODIUM CHLORIDE 0.9% FLUSH
3.0000 mL | Freq: Two times a day (BID) | INTRAVENOUS | Status: DC
  Administered 2024-06-16 – 2024-06-20 (×9): 3 mL via INTRAVENOUS

## 2024-06-16 MED ORDER — FUROSEMIDE 10 MG/ML IJ SOLN
40.0000 mg | Freq: Two times a day (BID) | INTRAMUSCULAR | Status: DC
  Administered 2024-06-16 – 2024-06-17 (×2): 40 mg via INTRAVENOUS
  Filled 2024-06-16 (×2): qty 4

## 2024-06-16 MED ORDER — ALPRAZOLAM 0.5 MG PO TABS
0.5000 mg | ORAL_TABLET | Freq: Two times a day (BID) | ORAL | Status: DC | PRN
  Administered 2024-06-16 – 2024-06-18 (×2): 0.5 mg via ORAL
  Filled 2024-06-16 (×2): qty 1

## 2024-06-16 MED ORDER — POTASSIUM CHLORIDE CRYS ER 20 MEQ PO TBCR
40.0000 meq | EXTENDED_RELEASE_TABLET | Freq: Once | ORAL | Status: AC
  Administered 2024-06-16: 40 meq via ORAL
  Filled 2024-06-16: qty 2

## 2024-06-16 MED ORDER — POTASSIUM CHLORIDE CRYS ER 20 MEQ PO TBCR
40.0000 meq | EXTENDED_RELEASE_TABLET | Freq: Every day | ORAL | Status: DC
  Administered 2024-06-17: 40 meq via ORAL
  Filled 2024-06-16 (×2): qty 2

## 2024-06-16 MED ORDER — ATORVASTATIN CALCIUM 40 MG PO TABS
80.0000 mg | ORAL_TABLET | Freq: Every day | ORAL | Status: DC
  Administered 2024-06-16 – 2024-06-20 (×5): 80 mg via ORAL
  Filled 2024-06-16 (×5): qty 2

## 2024-06-16 MED ORDER — FUROSEMIDE 10 MG/ML IJ SOLN
40.0000 mg | Freq: Once | INTRAMUSCULAR | Status: AC
  Administered 2024-06-16: 40 mg via INTRAVENOUS
  Filled 2024-06-16: qty 4

## 2024-06-16 NOTE — Plan of Care (Signed)
   Problem: Activity: Goal: Risk for activity intolerance will decrease Outcome: Progressing   Problem: Coping: Goal: Level of anxiety will decrease Outcome: Progressing

## 2024-06-16 NOTE — Hospital Course (Signed)
 68 year old female with a history of HFrEF (EF 35-40%), hypertension, tobacco abuse, non-Hodgkin's lymphoma, hyperlipidemia presenting with at least 1 week history of worsening shortness of breath.  The patient has been having PND and orthopnea symptoms to the point where she has had to get up to sleep in a recliner for the past week.  She denies any fevers, chills, chest pain, nausea, vomiting or diarrhea, abdominal pain.  She has a nonproductive cough.  She continues to smoke 1/2 pack/day.  She has smoked up to 2 packs/day x 50 years. She denies any abdominal pain, hematochezia, melena.  Notably, the patient was hospitalized from 03/20/2024 to 03/23/2024 for Acute HFrEF.  She was discharged home with furosemide  20 mg daily.  Her discharge weight was 53.5 kg.  Since then, the patient underwent a left heart catheterization on 05/25/2024 which showed multivessel CAD with 50 to 60% of proximal/mid LAD, chronic CTO of the RCA, 50 to 70% of the OM 2.  Imdur  15 mg was added as well as aspirin  81 mg daily.  Apparently, the patient was recently put on nitrofurantoin for presumptive UTI on 06/10/2024.  She was told to cut her Farxiga  to 5 mg daily.  In the ED, the patient was afebrile and hemodynamically stable with oxygen saturation 95% on room air.  WBC 8.3, hemoglobin 12.9, platelets 185.  Sodium 133, potassium 3.3, bicarbonate 25, serum creatinine 1.01.  AST 31, ALT 25, alk phos 5057, total bilirubin 1.6.  UA was negative for pyuria.  CTA chest was negative for PE but showed interstitial edema and bilateral pleural effusions, right greater than left.  The patient was given furosemide  40 mg IV x 1.

## 2024-06-16 NOTE — H&P (Addendum)
 History and Physical    Patient: Deborah Cooley FMW:[id number] DOB: May 30, 1956 DOA: 06/16/2024 DOS: the patient was seen and examined on 06/16/2024 PCP: Adine Duwaine MATSU, FNP  Patient coming from: Home  Chief Complaint:  Chief Complaint  Patient presents with   Shortness of Breath   HPI: Deborah Cooley is a 68 year old female with a history of HFrEF (EF 35-40%), hypertension, tobacco abuse, non-Hodgkin's lymphoma, hyperlipidemia presenting with at least 1 week history of worsening shortness of breath.  The patient has been having PND and orthopnea symptoms to the point where she has had to get up to sleep in a recliner for the past week.  She denies any fevers, chills, chest pain, nausea, vomiting or diarrhea, abdominal pain.  She has a nonproductive cough.  She continues to smoke 1/2 pack/day.  She has smoked up to 2 packs/day x 50 years. She denies any abdominal pain, hematochezia, melena.  Notably, the patient was hospitalized from 03/20/2024 to 03/23/2024 for Acute HFrEF.  She was discharged home with furosemide  20 mg daily.  Her discharge weight was 53.5 kg.  Since then, the patient underwent a left heart catheterization on 05/25/2024 which showed multivessel CAD with 50 to 60% of proximal/mid LAD, chronic CTO of the RCA, 50 to 70% of the OM 2.  Imdur  15 mg was added as well as aspirin  81 mg daily.  Apparently, the patient was recently put on nitrofurantoin for presumptive UTI on 06/10/2024.  She was told to cut her Farxiga  to 5 mg daily.  In the ED, the patient was afebrile and hemodynamically stable with oxygen saturation 95% on room air.  WBC 8.3, hemoglobin 12.9, platelets 185.  Sodium 133, potassium 3.3, bicarbonate 25, serum creatinine 1.01.  AST 31, ALT 25, alk phos 5057, total bilirubin 1.6.  UA was negative for pyuria.  CTA chest was negative for PE but showed interstitial edema and bilateral pleural effusions, right greater than left.  The patient was given furosemide  40 mg IV x  1.  Review of Systems: As mentioned in the history of present illness. All other systems reviewed and are negative. Past Medical History:  Diagnosis Date   angioimmunoblastic lymphoma dx'd 03/2018   BCC (basal cell carcinoma of skin) 05/02/2023   left nasal crease - schedule for Mohs   BCC (basal cell carcinoma), face 05/02/2023   mid tip of nose - schedule for Mohs   Bilateral pleural effusion 04/17/2018   Endometriosis    Essential hypertension, benign    Family history of adverse reaction to anesthesia    sister stops breathing I think (04/18/2018)   Heart murmur    Mixed hyperlipidemia    Pneumonia 04/17/2018   Past Surgical History:  Procedure Laterality Date   ABDOMINAL HYSTERECTOMY  1970s/1980s   for endometriosis   Arthroscopic right shoulder surgery     AXILLARY LYMPH NODE BIOPSY Left 04/21/2018   Procedure: LEFT AXILLARY LYMPH NODE BIOPSY;  Surgeon: Stevie Herlene Righter, MD;  Location: [facility] OR;  Service: General;  Laterality: Left;   CARPAL TUNNEL RELEASE Right    GANGLION CYST EXCISION Right    IR IMAGING GUIDED PORT INSERTION  05/07/2018   IR REMOVAL TUN ACCESS W/ PORT W/O FL MOD SED  12/23/2018   LEFT HEART CATH AND CORONARY ANGIOGRAPHY N/A 05/25/2024   Procedure: LEFT HEART CATH AND CORONARY ANGIOGRAPHY;  Surgeon: Mady Bruckner, MD;  Location: [facility] INVASIVE CV LAB;  Service: Cardiovascular;  Laterality: N/A;   MULTIPLE TOOTH EXTRACTIONS  OOPHORECTOMY  1997   PARTIAL HYSTERECTOMY  1984   Right hand surgery     SHOULDER OPEN ROTATOR CUFF REPAIR Right    VESICOVAGINAL FISTULA CLOSURE W/ TAH     Social History:  reports that she has been smoking cigarettes. She has a 69 pack-year smoking history. She has been exposed to tobacco smoke. She has never used smokeless tobacco. She reports that she does not currently use alcohol. She reports that she does not currently use drugs.  Allergies  Allergen Reactions   Ambien  [Zolpidem ] Other (See Comments)     Confusion,  lightheadedness, dizzy, bad dreams    Family History  Problem Relation Age of Onset   Diabetes Mother        age 12   Heart attack Father        died age 36's   Hypertension Father    Cancer Sister        breast cancer diagonsed age 65 years   Thyroid disease Sister        one sister with thyroid disease   Diabetes Other    Heart disease Other        Female <55   Arthritis Other    Asthma Other    Hyperlipidemia Other        several siblings    Prior to Admission medications   Medication Sig Start Date End Date Taking? Authorizing Provider  aspirin  EC 81 MG tablet Take 1 tablet (81 mg total) by mouth daily. Swallow whole. 05/06/24   Furth, Cadence H, PA-C  atorvastatin  (LIPITOR) 80 MG tablet Take 1 tablet (80 mg total) by mouth daily. 05/08/24 08/06/24  Meng, Hao, PA  dapagliflozin  propanediol (FARXIGA ) 10 MG TABS tablet Take 1 tablet (10 mg total) by mouth daily. 03/30/24   Hayes Beckey CROME, NP  furosemide  (LASIX ) 20 MG tablet Take 1 tablet (20 mg total) by mouth daily. 05/25/24   End, Lonni, MD  isosorbide  mononitrate (IMDUR ) 30 MG 24 hr tablet Take 0.5 tablets (15 mg total) by mouth daily. 05/25/24 05/25/25  End, Lonni, MD  metoprolol  succinate (TOPROL -XL) 25 MG 24 hr tablet Take 1 tablet (25 mg total) by mouth at bedtime. Take with or immediately following a meal. 05/08/24   Janene Boer, PA  nitroGLYCERIN  (NITROSTAT ) 0.4 MG SL tablet Place 1 tablet (0.4 mg total) under the tongue every 5 (five) minutes as needed for chest pain. If a single episode of chest pain is not relieved by one tablet, the patient will try another within 5 minutes; and if this doesn't relieve the pain, the patient is instructed to call 911 for transportation to an emergency department. 05/06/24 08/04/24  [place] Mikey DEL, PA-C    Physical Exam: Vitals:   06/16/24 0930 06/16/24 0945 06/16/24 1145 06/16/24 1200  BP: (!) 149/91 (!) 155/94 (!) 157/74 (!) 150/70  Pulse: 97 100 (!) 108 97  Resp: (!) 38 (!) 21 (!) 36  18  SpO2: 95% 97% 94% 93%  Weight:      Height:       GENERAL:  A&O x 3, NAD, well developed, cooperative, follows commands HEENT: [place]/AT, No thrush, No icterus, No oral ulcers Neck:  No neck mass, No meningismus, soft, supple CV: RRR, no S3, no S4, no rub,+ JVD Lungs:  bilateral crackles.  No wheeze Abd: soft/NT +BS, nondistended Ext: trace LE edema, no lymphangitis, no cyanosis, no rashes Neuro:  CN II-XII intact, strength 4/5 in RUE, RLE, strength 4/5 LUE, LLE; sensation  intact bilateral; no dysmetria; babinski equivocal  Data Reviewed: Data reviewed above in history  Assessment and Plan: Acute on chronic HFrEF - 03/21/2024 echo EF 35-40%, global HK, normal RVF, small pericardial effusion - Start furosemide  40 mg IV twice daily - Daily weights - Accurate I's and O's - Continue metoprolol  succinate - developed syncope with Entresto  - Holding Farxiga  temporarily in the setting of recent UTI  Coronary artery disease - No chest pain presently - 05/25/2024 LHC as discussed above - Continue aspirin  - Continue statin - Continue metoprolol  succinate - Continue Imdur  - Continue aspirin   Hyperglycemia/glycosuria - Check hemoglobin A1c  CKD stage IIIa - Baseline creatinine 0.9-1.1 - Monitor with diuresis  COPD - Stable on room air - Start Brovana  - Start Pulmicort  - Start DuoNebs - Patient requesting nebulizer machine at the time of discharge  Essential hypertension - Continue metoprolol  succinate  Hypokalemia - Replete - Check magnesium   Hyponatremia - Secondary to fluid overload - Anticipate improvement with diuresis  Elevated troponin -due to demand ischemia -no chest pain  Anxiety -PDMP reviewed -xanax  0.5 mg, #60, refilled 05/20/24 -restart xanax      Advance Care Planning: FULL  Consults: none  Family Communication: daughter 7/29  Severity of Illness: The appropriate patient status for this patient is INPATIENT. Inpatient status is judged to be  reasonable and necessary in order to provide the required intensity of service to ensure the patient's safety. The patient's presenting symptoms, physical exam findings, and initial radiographic and laboratory data in the context of their chronic comorbidities is felt to place them at high risk for further clinical deterioration. Furthermore, it is not anticipated that the patient will be medically stable for discharge from the hospital within 2 midnights of admission.   * I certify that at the point of admission it is my clinical judgment that the patient will require inpatient hospital care spanning beyond 2 midnights from the point of admission due to high intensity of service, high risk for further deterioration and high frequency of surveillance required.*  Author: Alm Schneider, MD 06/16/2024 12:30 PM  For on call review [email or web address].

## 2024-06-16 NOTE — Plan of Care (Signed)

## 2024-06-16 NOTE — ED Provider Notes (Signed)
 [place] EMERGENCY DEPARTMENT AT [facility] Provider Note   CSN: [id number] Arrival date & time: 06/16/24  9145     Patient presents with: Shortness of Breath   Deborah Cooley is a 68 y.o. female.   Patient is a 67 year old female who presents to the emergency department with a chief complaint of increased shortness of breath, cough, congestion, weakness which has been progressive over the past month.  She notes that symptoms have become progressively worse over this time.  She notes that she does have some chest pain with coughing and deep inspiration.  She did have a cardiac cath performed approximately 2 weeks ago.  She notes that she is due to see pulmonology later on this week.  She denies any associated fever, chills, abdominal pain, nausea, vomiting, diarrhea.   Shortness of Breath Associated symptoms: cough        Prior to Admission medications   Medication Sig Start Date End Date Taking? Authorizing Provider  aspirin  EC 81 MG tablet Take 1 tablet (81 mg total) by mouth daily. Swallow whole. 05/06/24   Furth, Cadence H, PA-C  atorvastatin  (LIPITOR) 80 MG tablet Take 1 tablet (80 mg total) by mouth daily. 05/08/24 08/06/24  Meng, Hao, PA  dapagliflozin  propanediol (FARXIGA ) 10 MG TABS tablet Take 1 tablet (10 mg total) by mouth daily. 03/30/24   Hayes Beckey CROME, NP  furosemide  (LASIX ) 20 MG tablet Take 1 tablet (20 mg total) by mouth daily. 05/25/24   End, Lonni, MD  isosorbide  mononitrate (IMDUR ) 30 MG 24 hr tablet Take 0.5 tablets (15 mg total) by mouth daily. 05/25/24 05/25/25  End, Lonni, MD  metoprolol  succinate (TOPROL -XL) 25 MG 24 hr tablet Take 1 tablet (25 mg total) by mouth at bedtime. Take with or immediately following a meal. 05/08/24   Meng, Hao, PA  nitroGLYCERIN  (NITROSTAT ) 0.4 MG SL tablet Place 1 tablet (0.4 mg total) under the tongue every 5 (five) minutes as needed for chest pain. If a single episode of chest pain is not relieved by one tablet,  the patient will try another within 5 minutes; and if this doesn't relieve the pain, the patient is instructed to call 911 for transportation to an emergency department. 05/06/24 08/04/24  Furth, Cadence H, PA-C    Allergies: Ambien  [zolpidem ]    Review of Systems  Respiratory:  Positive for cough and shortness of breath.     Updated Vital Signs Ht 5' 2 (1.575 m)   Wt 52.6 kg   BMI 21.22 kg/m   Physical Exam Vitals and nursing note reviewed.  Constitutional:      General: She is not in acute distress.    Appearance: Normal appearance. She is not ill-appearing.  HENT:     Head: Normocephalic and atraumatic.     Nose: Nose normal.     Mouth/Throat:     Mouth: Mucous membranes are moist.  Eyes:     Extraocular Movements: Extraocular movements intact.     Conjunctiva/sclera: Conjunctivae normal.     Pupils: Pupils are equal, round, and reactive to light.  Cardiovascular:     Rate and Rhythm: Normal rate and regular rhythm.     Pulses: Normal pulses.     Heart sounds: Normal heart sounds. No murmur heard.    No gallop.  Pulmonary:     Effort: Pulmonary effort is normal. Tachypnea present. No respiratory distress.     Breath sounds: Normal breath sounds. No decreased breath sounds, wheezing, rhonchi or rales.  Chest:     Chest wall: No tenderness.  Abdominal:     General: Abdomen is flat. Bowel sounds are normal.     Palpations: Abdomen is soft.     Tenderness: There is no abdominal tenderness. There is no guarding.  Musculoskeletal:        General: Normal range of motion.     Cervical back: Normal range of motion and neck supple.     Right lower leg: No edema.     Left lower leg: No edema.  Skin:    General: Skin is warm and dry.  Neurological:     General: No focal deficit present.     Mental Status: She is alert and oriented to person, place, and time. Mental status is at baseline.  Psychiatric:        Mood and Affect: Mood normal.        Behavior: Behavior normal.         Thought Content: Thought content normal.        Judgment: Judgment normal.     (all labs ordered are listed, but only abnormal results are displayed) Labs Reviewed  COMPREHENSIVE METABOLIC PANEL WITH GFR  CBC WITH DIFFERENTIAL/PLATELET  BRAIN NATRIURETIC PEPTIDE  URINALYSIS, ROUTINE W REFLEX MICROSCOPIC  TROPONIN I (HIGH SENSITIVITY)    EKG: None  Radiology: No results found.   Procedures   Medications Ordered in the ED  ipratropium-albuterol  (DUONEB) 0.5-2.5 (3) MG/3ML nebulizer solution 3 mL (has no administration in time range)  methylPREDNISolone  sodium succinate (SOLU-MEDROL ) 125 mg/2 mL injection 125 mg (has no administration in time range)  furosemide  (LASIX ) injection 40 mg (has no administration in time range)    Clinical Course as of 06/16/24 1158  Tue Jun 16, 2024  9075 CT Angio Chest PE W/Cm &/Or Wo Cm [AR]    Clinical Course User Index [AR] Casimir Odor, Student-PA                                 Medical Decision Making Amount and/or Complexity of Data Reviewed Labs: ordered. Radiology: ordered.  Risk Prescription drug management. Decision regarding hospitalization.   This patient presents to the ED for concern of shortness of breath, this involves an extensive number of treatment options, and is a complaint that carries with it a high risk of complications and morbidity.  The differential diagnosis includes CHF exacerbation, COPD exacerbation, ACS, pulmonary embolus, pericarditis, myocarditis, endocarditis, aortic aneurysm or dissection, pneumonia, pneumothorax, hemothorax   Co morbidities that complicate the patient evaluation  CHF and COPD   Additional history obtained:  Additional history obtained from medical records External records from outside source obtained and reviewed including medical records   Lab Tests:  I Ordered, and personally interpreted labs.  The pertinent results include: No leukocytosis, no anemia,  hyponatremia, hypokalemia, elevated creatinine, elevated troponin, elevated BNP, unremarkable urinalysis   Imaging Studies ordered:  I ordered imaging studies including CTA chest, chest x-ray I independently visualized and interpreted imaging which showed pulmonary edema and bilateral pleural effusions I agree with the radiologist interpretation   Cardiac Monitoring: / EKG:  The patient was maintained on a cardiac monitor.  I personally viewed and interpreted the cardiac monitored which showed an underlying rhythm of: Sinus tachycardia, rate of 101, normal PR/QRS interval, normal QTc, ST depressions in lateral leads, no STEMI, EKG consistent with previous   Consultations Obtained:  I requested consultation with the hospitalist,  and discussed lab and imaging findings as well as pertinent plan - they recommend: Admission   Problem List / ED Course / Critical interventions / Medication management  Patient is doing well at this time and does remain stable.  Discussed with patient that we will plan for admission to the hospitalist service for CHF exacerbation.  She was given Lasix  as well as breathing treatments in the emergency department.  Symptoms have began to improve.  Patient's BNP is elevated from previous values.  Suspect that her elevated troponin is secondary to demand.  She denies any active chest pain at this time.  She had no pulmonary embolus noted on CTA of the chest.  Vital signs have remained stable and she is not requiring any supplemental oxygen.  Did discuss patient case with Dr. Evonnie with the hospitalist service who has excepted for admission. I ordered medication including DuoNeb, Solu-Medrol , Lasix , potassium for CHF, COPD, hypokalemia Reevaluation of the patient after these medicines showed that the patient improved I have reviewed the patients home medicines and have made adjustments as needed   Social Determinants of Health:  None   Test / Admission -  Considered:  Admission     Final diagnoses:  None    ED Discharge Orders     None          Deborah Cooley 06/16/24 1200    Garrick Charleston, MD 06/18/24 1624

## 2024-06-16 NOTE — ED Triage Notes (Signed)
 Pt bib pov w/ c/o sob, coughing, and weakness. Pt reports she has been here in the past for the same and is being seen for issues. Pt reports she has an inhaler, but it has gotten too bad for it to help. Pt has a hx of lymphoma and is in remission. Pt states she has been told she has fluid around her heart and has seen CHF on paperwork but not been told she has CHF. She is requesting a breathing treatment.

## 2024-06-17 DIAGNOSIS — I2510 Atherosclerotic heart disease of native coronary artery without angina pectoris: Secondary | ICD-10-CM | POA: Diagnosis not present

## 2024-06-17 DIAGNOSIS — I5A Non-ischemic myocardial injury (non-traumatic): Secondary | ICD-10-CM

## 2024-06-17 DIAGNOSIS — I5043 Acute on chronic combined systolic (congestive) and diastolic (congestive) heart failure: Secondary | ICD-10-CM

## 2024-06-17 DIAGNOSIS — I5023 Acute on chronic systolic (congestive) heart failure: Secondary | ICD-10-CM | POA: Diagnosis not present

## 2024-06-17 LAB — BASIC METABOLIC PANEL WITH GFR
Anion gap: 16 — ABNORMAL HIGH (ref 5–15)
BUN: 19 mg/dL (ref 8–23)
CO2: 25 mmol/L (ref 22–32)
Calcium: 9.6 mg/dL (ref 8.9–10.3)
Chloride: 92 mmol/L — ABNORMAL LOW (ref 98–111)
Creatinine, Ser: 1.2 mg/dL — ABNORMAL HIGH (ref 0.44–1.00)
GFR, Estimated: 49 mL/min — ABNORMAL LOW (ref >60)
Glucose, Bld: 287 mg/dL — ABNORMAL HIGH (ref 70–99)
Potassium: 3 mmol/L — ABNORMAL LOW (ref 3.5–5.1)
Sodium: 133 mmol/L — ABNORMAL LOW (ref 135–145)

## 2024-06-17 LAB — MAGNESIUM: Magnesium: 2 mg/dL (ref 1.7–2.4)

## 2024-06-17 LAB — HEMOGLOBIN A1C
Hgb A1c MFr Bld: 6.8 % — ABNORMAL HIGH (ref 4.8–5.6)
Mean Plasma Glucose: 148.46 mg/dL

## 2024-06-17 MED ORDER — BENZONATATE 100 MG PO CAPS
200.0000 mg | ORAL_CAPSULE | Freq: Three times a day (TID) | ORAL | Status: DC | PRN
  Administered 2024-06-17 (×2): 200 mg via ORAL
  Filled 2024-06-17 (×2): qty 2

## 2024-06-17 MED ORDER — POTASSIUM CHLORIDE CRYS ER 20 MEQ PO TBCR
40.0000 meq | EXTENDED_RELEASE_TABLET | Freq: Three times a day (TID) | ORAL | Status: DC
  Administered 2024-06-17 – 2024-06-18 (×5): 40 meq via ORAL
  Filled 2024-06-17 (×2): qty 2
  Filled 2024-06-17: qty 4
  Filled 2024-06-17 (×2): qty 2

## 2024-06-17 MED ORDER — IPRATROPIUM-ALBUTEROL 0.5-2.5 (3) MG/3ML IN SOLN
3.0000 mL | Freq: Four times a day (QID) | RESPIRATORY_TRACT | Status: DC | PRN
  Administered 2024-06-18: 3 mL via RESPIRATORY_TRACT
  Filled 2024-06-17: qty 3

## 2024-06-17 MED ORDER — FUROSEMIDE 10 MG/ML IJ SOLN
80.0000 mg | Freq: Two times a day (BID) | INTRAMUSCULAR | Status: DC
  Administered 2024-06-17 – 2024-06-19 (×5): 80 mg via INTRAVENOUS
  Filled 2024-06-17 (×6): qty 8

## 2024-06-17 MED ORDER — IPRATROPIUM-ALBUTEROL 0.5-2.5 (3) MG/3ML IN SOLN
3.0000 mL | Freq: Two times a day (BID) | RESPIRATORY_TRACT | Status: DC
  Administered 2024-06-17 – 2024-06-20 (×7): 3 mL via RESPIRATORY_TRACT
  Filled 2024-06-17 (×5): qty 3

## 2024-06-17 NOTE — Progress Notes (Signed)
   06/17/24 0413  ReDS Vest / Clip  BMI (Calculated) 20.32  Station Marker A  Ruler Value 31  ReDS Value Range (!) > 40  ReDS Actual Value 40

## 2024-06-17 NOTE — Progress Notes (Addendum)
 PROGRESS NOTE    Deborah Cooley  FMW:[id number] DOB: [birth year] DOA: 06/16/2024 PCP: Adine Duwaine MATSU, FNP   Brief Narrative:    Deborah Cooley is a 68 year old female with a history of HFrEF (EF 35-40%), hypertension, tobacco abuse, non-Hodgkin's lymphoma, hyperlipidemia presenting with at least 1 week history of worsening shortness of breath.  The patient has been having PND and orthopnea symptoms to the point where she has had to get up to sleep in a recliner for the past week.  She denies any fevers, chills, chest pain, nausea, vomiting or diarrhea, abdominal pain.  Patient was admitted with acute on chronic HFrEF.  Assessment & Plan:   Principal Problem:   Acute on chronic HFrEF (heart failure with reduced ejection fraction) (HCC) Active Problems:   Tobacco abuse   Chronic kidney disease, stage 3a (HCC)   Coronary artery disease  Assessment and Plan:   Acute on chronic HFrEF - 03/21/2024 echo EF 35-40%, global HK, normal RVF, small pericardial effusion - IV Lasix  increased to 80 mg twice daily by cardiology - Daily weights - Accurate I's and O's - Continue metoprolol  succinate - developed syncope with Entresto  - Holding Farxiga  temporarily in the setting of recent UTI   Coronary artery disease - No chest pain presently - 05/25/2024 LHC as discussed above - Continue aspirin  - Continue statin - Continue metoprolol  succinate - Continue Imdur  - Continue aspirin    Hyperglycemia/glycosuria - Recent A1c 6.3%   CKD stage IIIa - Baseline creatinine 0.9-1.1, currently 1.2 - Monitor with diuresis   COPD - Stable on room air - Start Brovana  - Start Pulmicort  - Start DuoNebs - Patient requesting nebulizer machine at the time of discharge   Essential hypertension - Continue metoprolol  succinate   Hypokalemia - Replete - Check magnesium    Hyponatremia-stable - Secondary to fluid overload - Anticipate improvement with diuresis   Elevated troponin -due to  demand ischemia -no chest pain   Anxiety -PDMP reviewed -xanax  0.5 mg, #60, refilled 05/20/24 -restart xanax       DVT prophylaxis:Lovenox  Code Status: Full Family Communication: Family at bedside 7/30 Disposition Plan:  Status is: Inpatient Remains inpatient appropriate because: Need for IV medications.   Consultants:  Cardiology  Procedures:  None  Antimicrobials:  None   Subjective: Patient seen and evaluated today with some persistent dyspnea, but this is slowly improving.  No acute overnight events noted.  Objective: Vitals:   06/16/24 2020 06/17/24 0410 06/17/24 0413 06/17/24 0834  BP: 138/65 (!) 146/72  114/87  Pulse: 97 94  (!) 109  Resp: 19 (!) 21  18  Temp: (!) 97.4 F (36.3 C) 97.7 F (36.5 C)  97.6 F (36.4 C)  TempSrc: Oral Oral  Oral  SpO2: 94% 94%  100%  Weight:   50.4 kg   Height:        Intake/Output Summary (Last 24 hours) at 06/17/2024 1206 Last data filed at 06/17/2024 0900 Gross per 24 hour  Intake 840 ml  Output --  Net 840 ml   Filed Weights   06/16/24 0905 06/16/24 1553 06/17/24 0413  Weight: 52.6 kg 51.7 kg 50.4 kg    Examination:  General exam: Appears calm and comfortable  Respiratory system: Clear to auscultation. Respiratory effort normal.  Currently on room air. Cardiovascular system: S1 & S2 heard, RRR.  Gastrointestinal system: Abdomen is soft Central nervous system: Alert and awake Extremities: No edema Skin: No significant lesions noted Psychiatry: Flat affect.    Data Reviewed: I have  personally reviewed following labs and imaging studies  CBC: Recent Labs  Lab 06/16/24 0922  WBC 8.3  NEUTROABS 5.4  HGB 12.9  HCT 41.0  MCV 82.0  PLT 185   Basic Metabolic Panel: Recent Labs  Lab 06/16/24 0922 06/16/24 1015 06/17/24 0431  NA 133*  --  133*  K 3.3*  --  3.0*  CL 94*  --  92*  CO2 25  --  25  GLUCOSE 177*  --  287*  BUN 14  --  19  CREATININE 1.01*  --  1.20*  CALCIUM  9.8  --  9.6  MG  --   2.1 2.0   GFR: Estimated Creatinine Clearance: 35 mL/min (A) (by C-G formula based on SCr of 1.2 mg/dL (H)). Liver Function Tests: Recent Labs  Lab 06/16/24 0922  AST 31  ALT 25  ALKPHOS 57  BILITOT 1.6*  PROT 6.8  ALBUMIN 4.0   No results for input(s): LIPASE, AMYLASE in the last 168 hours. No results for input(s): AMMONIA in the last 168 hours. Coagulation Profile: No results for input(s): INR, PROTIME in the last 168 hours. Cardiac Enzymes: No results for input(s): CKTOTAL, CKMB, CKMBINDEX, TROPONINI in the last 168 hours. BNP (last 3 results) No results for input(s): PROBNP in the last 8760 hours. HbA1C: No results for input(s): HGBA1C in the last 72 hours. CBG: No results for input(s): GLUCAP in the last 168 hours. Lipid Profile: No results for input(s): CHOL, HDL, LDLCALC, TRIG, CHOLHDL, LDLDIRECT in the last 72 hours. Thyroid Function Tests: No results for input(s): TSH, T4TOTAL, FREET4, T3FREE, THYROIDAB in the last 72 hours. Anemia Panel: No results for input(s): VITAMINB12, FOLATE, FERRITIN, TIBC, IRON, RETICCTPCT in the last 72 hours. Sepsis Labs: No results for input(s): PROCALCITON, LATICACIDVEN in the last 168 hours.  No results found for this or any previous visit (from the past 240 hours).       Radiology Studies: CT Angio Chest PE W/Cm &/Or Wo Cm Result Date: 06/16/2024 CLINICAL DATA:  Concern for pulmonary embolism. EXAM: CT ANGIOGRAPHY CHEST WITH CONTRAST TECHNIQUE: Multidetector CT imaging of the chest was performed using the standard protocol during bolus administration of intravenous contrast. Multiplanar CT image reconstructions and MIPs were obtained to evaluate the vascular anatomy. RADIATION DOSE REDUCTION: This exam was performed according to the departmental dose-optimization program which includes automated exposure control, adjustment of the mA and/or kV according to patient size  and/or use of iterative reconstruction technique. CONTRAST:  75mL OMNIPAQUE  IOHEXOL  350 MG/ML SOLN COMPARISON:  Chest radiograph dated 06/16/2024. FINDINGS: Cardiovascular: There is no cardiomegaly. Small pericardial effusion. There is coronary vascular calcification. Moderate atherosclerotic calcification of the thoracic aorta. No aneurysmal dilatation. No pulmonary artery embolus identified. Mediastinum/Nodes: No hilar adenopathy. The esophagus is grossly unremarkable. No mediastinal fluid collection. Lungs/Pleura: Moderate right and small left pleural effusions with partial compressive atelectasis of the lower lobes. Pneumonia is not excluded. There is diffuse interstitial and interlobular septal prominence consistent with edema. No pneumothorax. The central airways are patent. Upper Abdomen: No acute abnormality. Musculoskeletal: No acute osseous pathology. Review of the MIP images confirms the above findings. IMPRESSION: 1. No CT evidence of pulmonary artery embolus. 2. Interstitial edema with bilateral pleural effusions. 3.  Aortic Atherosclerosis (ICD10-I70.0). Electronically Signed   By: Vanetta Chou M.D.   On: 06/16/2024 11:37   DG Chest Port 1 View Result Date: 06/16/2024 CLINICAL DATA:  Cough and shortness of breath EXAM: PORTABLE CHEST 1 VIEW COMPARISON:  Chest radiograph dated 03/20/2024  FINDINGS: Unchanged coarse rounded calcification corresponding to known right thyroid nodule. Normal lung volumes. Mild diffuse interstitial opacities. Blunting of bilateral costophrenic angles. Enlarged cardiomediastinal silhouette. No acute osseous abnormality. IMPRESSION: 1. Mild diffuse interstitial opacities, which may represent pulmonary edema. 2. Blunting of bilateral costophrenic angles, which may represent small pleural effusions. 3. Cardiomegaly. Electronically Signed   By: Limin  Xu M.D.   On: 06/16/2024 10:26        Scheduled Meds:  arformoterol   15 mcg Nebulization BID   aspirin  EC  81 mg  Oral Daily   atorvastatin   80 mg Oral Daily   budesonide  (PULMICORT ) nebulizer solution  0.5 mg Nebulization BID   enoxaparin  (LOVENOX ) injection  40 mg Subcutaneous Q24H   furosemide   80 mg Intravenous BID   ipratropium-albuterol   3 mL Nebulization BID   isosorbide  mononitrate  15 mg Oral Daily   metoprolol  succinate  25 mg Oral QHS   nicotine   14 mg Transdermal Daily   potassium chloride   40 mEq Oral TID   sodium chloride  flush  3 mL Intravenous Q12H   Continuous Infusions:  sodium chloride        LOS: 1 day    Time spent: 55 minutes    Meela Wareing JONETTA Fairly, DO [facility]  If 7PM-7AM, please contact night-coverage [email or web address] 06/17/2024, 12:06 PM

## 2024-06-17 NOTE — TOC Initial Note (Signed)
 Transition of Care [facility]) - Initial/Assessment Note    Patient Details  Name: Deborah Cooley MRN: [id number] Date of Birth: [birth year]  Transition of Care [facility] CM/SW Contact:    [name], LCSWA Phone Number: 06/17/2024, 11:09 AM  Clinical Narrative:                  CSW was consulted for CHF. CSW assessed patient. Patient is independent , can still drive but does not, and does not use/ have any equipment. Patient is followed by a cardiologist in the community, weighs herself daily, eat what she wants, and take all her prescribed medications. TOC following.   Expected Discharge Plan: Home/Self Care Barriers to Discharge: Continued Medical Work up   Patient Goals and CMS Choice Patient states their goals for this hospitalization and ongoing recovery are:: return back home CMS Medicare.gov Compare Post Acute Care list provided to:: Patient        Expected Discharge Plan and Services In-house Referral: Clinical Social Work     Living arrangements for the past 2 months: Single Family Home                                      Prior Living Arrangements/Services Living arrangements for the past 2 months: Single Family Home Lives with:: Spouse   Do you feel safe going back to the place where you live?: Yes      Need for Family Participation in Patient Care: No (Comment) Care giver support system in place?: No (comment)   Criminal Activity/Legal Involvement Pertinent to Current Situation/Hospitalization: No - Comment as needed  Activities of Daily Living   ADL Screening (condition at time of admission) Independently performs ADLs?: Yes (appropriate for developmental age) Is the patient deaf or have difficulty hearing?: No Does the patient have difficulty seeing, even when wearing glasses/contacts?: No Does the patient have difficulty concentrating, remembering, or making decisions?: No  Permission Sought/Granted      Share Information with NAME:  Jamar     Permission granted to share info w Relationship: Patient     Emotional Assessment Appearance:: Appears stated age Attitude/Demeanor/Rapport: Engaged Affect (typically observed): Appropriate Orientation: : Oriented to Self, Oriented to Place, Oriented to  Time, Oriented to Situation Alcohol / Substance Use: Tobacco Use Psych Involvement: No (comment)  Admission diagnosis:  Hypokalemia [E87.6] Acute on chronic HFrEF (heart failure with reduced ejection fraction) (HCC) [I50.23] Acute on chronic congestive heart failure, unspecified heart failure type (HCC) [I50.9] Patient Active Problem List   Diagnosis Date Noted   Acute on chronic HFrEF (heart failure with reduced ejection fraction) (HCC) 06/16/2024   Chronic kidney disease, stage 3a (HCC) 06/16/2024   Coronary artery disease 06/16/2024   CAD (coronary artery disease) 05/20/2024   Chronic systolic heart failure (HCC) 05/20/2024   HLD (hyperlipidemia) 05/20/2024   CHF (congestive heart failure) (HCC) 03/30/2024   SOB (shortness of breath) 03/20/2024   Elevated brain natriuretic peptide (BNP) level 03/20/2024   Port-A-Cath in place 08/29/2018   Counseling regarding advance care planning and goals of care 06/30/2018   Lymphadenopathy    Neutropenia, drug-induced (HCC)    Status post thoracentesis    Acute respiratory failure (HCC)    Encounter for antineoplastic chemotherapy    At high risk of tumor lysis syndrome    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   Thrombocytopenia (HCC) 05/06/2018   Costochondritis 04/29/2018   Subclinical  hypothyroidism 04/29/2018   Hyponatremia 04/29/2018   Dyspnea 04/26/2018   Diffuse lymphadenopathy 04/21/2018   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 [place] [address] Phone: [phone] Fax: [phone]     Social Drivers of Health (SDOH) Social History: SDOH Screenings   Food Insecurity: No Food Insecurity (06/16/2024)  Housing: Low Risk  (06/16/2024)  Transportation Needs: No Transportation Needs (06/16/2024)  Utilities: Not At Risk (06/16/2024)  Financial Resource Strain: Low Risk  (05/20/2024)   Received from [facility]  Social Connections: Moderately Isolated (06/16/2024)  Tobacco Use: High Risk (06/16/2024)   SDOH Interventions:     Readmission Risk Interventions    06/17/2024   10:45 AM  Readmission Risk Prevention Plan  Transportation Screening Complete  Home Care Screening Complete  Medication Review (RN CM) Complete

## 2024-06-17 NOTE — Consult Note (Signed)
 Cardiology Consultation   Patient ID: Deborah Cooley MRN: [id number]; DOB: 1956-11-05  Admit date: 06/16/2024 Date of Consult: 06/17/2024  PCP:  Deborah Duwaine MATSU, FNP   [facility] Providers Cardiologist:  Deborah P Mallipeddi, MD     Patient Profile: Deborah Cooley is a 68 y.o. female with a hx of chronic HFrEF (EF 2019 60-65% then new onset cardiomyopathy 03/2024 35-40%), Hx of bilateral pleural effusion, CAD (05/25/2024: showed multivessel CAD including sequential 50-60% prox/mid LAD including D2, sequential 50-70% OM2, chronic total occulusion of mid RCA, and midly elevated LVEDP of 21 mmHG; medical management), HLD, HTN who is being seen 06/17/2024 for the evaluation of acute CHF at the request of Deborah Cooley.  History of Present Illness: Deborah Cooley echocardiogram in May 2025 showed LVEF 35 to 40%, new onset cardiomyopathy and CVP 3 mmHg.  No evidence of valvular disease was noted.  CT cardiac for ischemic evaluation showed Ca score of 477 (93 percentile) with proximal to mid RCA, 50% to 69% stenosis of the proximal OM1 and mid LAD, which were noted to be flow-limiting.  Patient was seen in June 2025 by Deborah Cooley with plans to schedule LHC but patient deferred until grandson graduated from [employer].  Last seen in heart care OV 05/20/2024 with Dr. Mallipeddi for preoperative CV evaluation.  Reported continuous DOE since 03/2024 and willingness to undergo LHC now.  Noted that CT cardiac also showed bilateral pleural effusion, right greater than left.  She never underwent US  thoracentesis.  Ordered cardiac catheter as below.  Follow-up labs 05/2024 showed LDL of 53 and BMP/CBC WNL.  Outpatient LHC on 7/7 showed multivessel CAD including sequential 50-60% prox/mid LAD including D2, sequential 50-70% OM2, chronic total occulusion of mid RCA, and midly elevated LVEDP of 21 mmHG.  Recommended Imdur  for antianginal therapy, secondary prevention of CAD, escalation of GDMT for mixed  ischemia and nonischemic cardiomyopathy, and increase furosemide  to 40 mg daily.  Presented to [facility] ED 7/29 with concern for worsening SOB, PND,  orthopnea, and nonproductive cough. K 3.3 now 3, CR 1.01 now 1.2, Mg 2.1, BNP 2829, TN 44 > 36 EKG showed Sinus tachycardia, HR 101, LVH, RAD with no significant acute ischemic changes.  CXR showed mild diffuse interstitial opacities suggesting pulmonary edema, blunting of bilateral costophrenic angles suggesting small pleural effusions, and cardiomegaly. CTA showed no evidence of PE but noted interstitial edema with bilateral pleural effusion and aortic atherosclerosis. ED treated with IV Lasix  40 mg x1.   On interview, patient reports worsening DOE, orthopnea, and cough (sometimes nonproductive and other times clear phlegm) X 2 weeks.  Notes ongoing SOB since 03/2024 but now with minimal exertion, at rest and when coughing.  Notes orthopnea since 5/205 and sleeping with 2 thick pillows.  Inhaler offers some improvement but not completely resolved.  After receiving first breathing treatment this hospitalization, notes SOB resolved within 10 minutes. She had plans to follow-up with PCP tomorrow for pulmonary referral.  Reports medication compliance.  At one point, she was taking Lasix  20 mg daily and was switched to PRN, which is when symptoms worsen.  Reports yesterday her home weight was 116 lbs and denies any significant weight gain.  Also reports intermittent palpitations lasting for seconds about x1 per month, described as racing, and occurs at rest.  Drinks proximately 12 cups of coffee and 1-2 diet sodas per day.  Reports eating what ever she wants but notes only using salt on cucumbers and tomatoes.  Reports  previously used to smoke 2 PPD but now reduced to <1/2 PPD.  Denies any alcohol use or drug use.  Prior to May patient was very active and able to do yard work without any exertional concerns.  Currently, activity is very limited due to DOE.  Past  Medical History:  Diagnosis Date   angioimmunoblastic lymphoma dx'd 03/2018   BCC (basal cell carcinoma of skin) 05/02/2023   left nasal crease - schedule for Mohs   BCC (basal cell carcinoma), face 05/02/2023   mid tip of nose - schedule for Mohs   Bilateral pleural effusion 04/17/2018   Endometriosis    Essential hypertension, benign    Family history of adverse reaction to anesthesia    sister stops breathing I think (04/18/2018)   Heart murmur    Mixed hyperlipidemia    Pneumonia 04/17/2018    Past Surgical History:  Procedure Laterality Date   ABDOMINAL HYSTERECTOMY  1970s/1980s   for endometriosis   Arthroscopic right shoulder surgery     AXILLARY LYMPH NODE BIOPSY Left 04/21/2018   Procedure: LEFT AXILLARY LYMPH NODE BIOPSY;  Surgeon: Deborah Herlene Righter, MD;  Location: [facility] OR;  Service: General;  Laterality: Left;   CARPAL TUNNEL RELEASE Right    GANGLION CYST EXCISION Right    IR IMAGING GUIDED PORT INSERTION  05/07/2018   IR REMOVAL TUN ACCESS W/ PORT W/O FL MOD SED  12/23/2018   LEFT HEART CATH AND CORONARY ANGIOGRAPHY N/A 05/25/2024   Procedure: LEFT HEART CATH AND CORONARY ANGIOGRAPHY;  Surgeon: Deborah Bruckner, MD;  Location: [facility] INVASIVE CV LAB;  Service: Cardiovascular;  Laterality: N/A;   MULTIPLE TOOTH EXTRACTIONS     OOPHORECTOMY  1997   PARTIAL HYSTERECTOMY  1984   Right hand surgery     SHOULDER OPEN ROTATOR CUFF REPAIR Right    VESICOVAGINAL FISTULA CLOSURE W/ TAH       Home Medications:  Prior to Admission medications   Medication Sig Start Date End Date Taking? Authorizing Provider  albuterol  (VENTOLIN  HFA) 108 (90 Base) MCG/ACT inhaler Inhale 2 puffs into the lungs every 6 (six) hours as needed for wheezing or shortness of breath. 06/10/24  Yes [provider]  ALPRAZolam  (XANAX ) 0.5 MG tablet Take 0.5 mg by mouth 2 (two) times daily as needed for anxiety or sleep. 05/20/24  Yes [provider]  aspirin  EC 81 MG tablet Take 1 tablet (81 mg  total) by mouth daily. Swallow whole. 05/06/24  Yes Cooley, Deborah H, PA-C  atorvastatin  (LIPITOR) 80 MG tablet Take 1 tablet (80 mg total) by mouth daily. 05/08/24 08/06/24 Yes Meng, Hao, PA  dapagliflozin  propanediol (FARXIGA ) 10 MG TABS tablet Take 1 tablet (10 mg total) by mouth daily. 03/30/24  Yes Hayes Beckey CROME, NP  furosemide  (LASIX ) 20 MG tablet Take 1 tablet (20 mg total) by mouth daily. 05/25/24  Yes End, Bruckner, MD  isosorbide  mononitrate (IMDUR ) 30 MG 24 hr tablet Take 0.5 tablets (15 mg total) by mouth daily. 05/25/24 05/25/25 Yes End, Bruckner, MD  metoprolol  succinate (TOPROL -XL) 25 MG 24 hr tablet Take 1 tablet (25 mg total) by mouth at bedtime. Take with or immediately following a meal. Patient taking differently: Take 25 mg by mouth daily. Take with or immediately following a meal. 05/08/24  Yes Meng, Hao, PA  nitrofurantoin, macrocrystal-monohydrate, (MACROBID) 100 MG capsule Take 100 mg by mouth 2 (two) times daily. 06/10/24 06/17/24 Yes [provider]  nitroGLYCERIN  (NITROSTAT ) 0.4 MG SL tablet Place 1 tablet (0.4 mg  total) under the tongue every 5 (five) minutes as needed for chest pain. If a single episode of chest pain is not relieved by one tablet, the patient will try another within 5 minutes; and if this doesn't relieve the pain, the patient is instructed to call 911 for transportation to an emergency department. 05/06/24 08/04/24 Yes Cooley, Deborah H, PA-C    Scheduled Meds:  arformoterol   15 mcg Nebulization BID   aspirin  EC  81 mg Oral Daily   atorvastatin   80 mg Oral Daily   budesonide  (PULMICORT ) nebulizer solution  0.5 mg Nebulization BID   enoxaparin  (LOVENOX ) injection  40 mg Subcutaneous Q24H   furosemide   40 mg Intravenous BID   ipratropium-albuterol   3 mL Nebulization TID   isosorbide  mononitrate  15 mg Oral Daily   metoprolol  succinate  25 mg Oral QHS   nicotine   14 mg Transdermal Daily   potassium chloride   40 mEq Oral Daily   sodium chloride  flush  3  mL Intravenous Q12H   Continuous Infusions:  sodium chloride      PRN Meds: sodium chloride , acetaminophen , ALPRAZolam , benzonatate , ondansetron  (ZOFRAN ) IV, sodium chloride  flush  Allergies:    Allergies  Allergen Reactions   Ambien  [Zolpidem ] Other (See Comments)     Confusion, lightheadedness, dizzy, bad dreams   Hydrocodone Other (See Comments)    Patient states it gives her night mares    Social History:   Social History   Socioeconomic History   Marital status: Married    Spouse name: Not on file   Number of children: 1   Years of education: Not on file   Highest education level: GED or equivalent  Occupational History   Occupation: Designer, industrial/product    Comment: works at [employer]  Tobacco Use   Smoking status: Every Day    Current packs/day: 1.50    Average packs/day: 1.5 packs/day for 46.0 years (69.0 ttl pk-yrs)    Types: Cigarettes    Passive exposure: Current   Smokeless tobacco: Never  Vaping Use   Vaping status: Never Used  Substance and Sexual Activity   Alcohol use: Not Currently    Comment: 04/18/2018 quit years ago   Drug use: Not Currently    Comment: back in my 40s   Sexual activity: Not Currently  Other Topics Concern   Not on file  Social History Narrative   ** Merged History Encounter **       Patient has one son whom is healthy.   Has a live in boyfriend.      Family History:   Family History  Problem Relation Age of Onset   Diabetes Mother        age 24   Heart attack Father        died age 67's   Hypertension Father    Cancer Sister        breast cancer diagonsed age 40 years   Thyroid disease Sister        one sister with thyroid disease   Diabetes Other    Heart disease Other        Female <55   Arthritis Other    Asthma Other    Hyperlipidemia Other        several siblings     ROS:  Please see the history of present illness.  All other ROS reviewed and negative.     Physical Exam/Data: Vitals:    06/16/24 1938 06/16/24 2020 06/17/24 0410 06/17/24 9586  BP:  138/65 (!) 146/72   Pulse:  97 94   Resp:  19 (!) 21   Temp:  (!) 97.4 F (36.3 C) 97.7 F (36.5 C)   TempSrc:  Oral Oral   SpO2: 97% 94% 94%   Weight:    50.4 kg  Height:        Intake/Output Summary (Last 24 hours) at 06/17/2024 0732 Last data filed at 06/16/2024 2130 Gross per 24 hour  Intake 600 ml  Output --  Net 600 ml      06/17/2024    4:13 AM 06/16/2024    3:53 PM 06/16/2024    9:05 AM  Last 3 Weights  Weight (lbs) 111 lb 1.8 oz 113 lb 15.7 oz 116 lb  Weight (kg) 50.4 kg 51.7 kg 52.617 kg     Body mass index is 20.32 kg/m.  General:  Well nourished, well developed, in no acute distress HEENT: normal Neck: no JVD Vascular: No carotid bruits; Distal pulses 2+ bilaterally Cardiac:  normal S1, S2; RRR; no murmur  Lungs: Diminished breath sounds in the lung bases, more on the right than left. Abd: soft, nontender, no hepatomegaly  Ext: no edema Musculoskeletal:  No deformities, BUE and BLE strength normal and equal Skin: warm and dry  Neuro:  CNs 2-12 intact, no focal abnormalities noted Psych:  Normal affect   EKG:  The EKG was personally reviewed and demonstrates:  Sinus tachycardia, HR 101, LVH, RAD with no significant acute ischemic changes Telemetry:  Telemetry was personally reviewed and demonstrates:  NSR vs ST with HR mainly 90-100's with PVC's  Relevant CV Studies: ECHO IMPRESSIONS 03/2024   1. Left ventricular ejection fraction, by estimation, is 35 to 40%. The  left ventricle has moderately decreased function. The left ventricle  demonstrates global hypokinesis. Left ventricular diastolic parameters are  indeterminate.   2. Right ventricular systolic function is normal. The right ventricular  size is normal. Tricuspid regurgitation signal is inadequate for assessing  PA pressure.   3. A small pericardial effusion is present. The pericardial effusion is  circumferential.   4. The mitral  valve is normal in structure. Trivial mitral valve  regurgitation. No evidence of mitral stenosis.   5. The aortic valve is tricuspid. Aortic valve regurgitation is not  visualized. No aortic stenosis is present.   6. The inferior vena cava is normal in size with greater than 50%  respiratory variability, suggesting right atrial pressure of 3 mmHg.   Cardiac CT 04/2024 IMPRESSION: 1. Coronary calcium  score of 477. This was 93rd percentile for age-, sex, and race-matched controls. 2. Total plaque volume 809 mm3 which is 88th percentile for age- and sex-matched controls (calcified plaque 154mm3; non-calcified plaque 615mm3). TPV is extensive. 3. Normal coronary origin with right dominance. 4. Severe atherosclerosis: Subtotaled prox to mid RCA, 50-69% prox OM1 and mid LAD. 5. Recommend cardiac catheterization. 6. This study has been submitted for FFR analysis. IMPRESSION: 1. Coronary CTA FFR analysis demonstrates possible subtotal occlusion of the proximal RCA and possible hemodynamically significant flow limiting lesions in the LAD (0.84>>0.61) and OM1 (0.80>>0.68).   2. Recommend Cardiac Catheterization.  Cath 05/2024 Conclusions: Multivessel coronary artery disease, as detailed below, including sequential 50-60% proximal/mid LAD stenoses involving D2, sequential 50-70% OM2 lesions, and chronic total occlusion of mid RCA. Mildly elevated left ventricular filling pressure (LVEDP 21 mmHg).   Recommendations: Add isosorbide  mononitrate for antianginal therapy.  Continue secondary prevention of CAD, including smoking cessation. Continue escalation of goal-directed medical  therapy for mixed ischemic and nonischemic cardiomyopathy (LVEF reduction is out of proportion to the extent of CAD). Change furosemide  to 20 mg daily.   Lonni Hanson, MD [facility]  Laboratory Data: High Sensitivity Troponin:   Recent Labs  Lab 06/16/24 0922 06/16/24 1111  TROPONINIHS 44* 36*      Chemistry Recent Labs  Lab 06/16/24 0922 06/16/24 1015 06/17/24 0431  NA 133*  --  133*  K 3.3*  --  3.0*  CL 94*  --  92*  CO2 25  --  25  GLUCOSE 177*  --  287*  BUN 14  --  19  CREATININE 1.01*  --  1.20*  CALCIUM  9.8  --  9.6  MG  --  2.1 2.0  GFRNONAA >60  --  49*  ANIONGAP 14  --  16*    Recent Labs  Lab 06/16/24 0922  PROT 6.8  ALBUMIN 4.0  AST 31  ALT 25  ALKPHOS 57  BILITOT 1.6*   Lipids No results for input(s): CHOL, TRIG, HDL, LABVLDL, LDLCALC, CHOLHDL in the last 168 hours.  Hematology Recent Labs  Lab 06/16/24 0922  WBC 8.3  RBC 5.00  HGB 12.9  HCT 41.0  MCV 82.0  MCH 25.8*  MCHC 31.5  RDW 17.0*  PLT 185   Thyroid No results for input(s): TSH, FREET4 in the last 168 hours.  BNP Recent Labs  Lab 06/16/24 0922  BNP 2,829.0*    DDimer No results for input(s): DDIMER in the last 168 hours.  Radiology/Studies:  CT Angio Chest PE W/Cm &/Or Wo Cm Result Date: 06/16/2024 CLINICAL DATA:  Concern for pulmonary embolism. EXAM: CT ANGIOGRAPHY CHEST WITH CONTRAST TECHNIQUE: Multidetector CT imaging of the chest was performed using the standard protocol during bolus administration of intravenous contrast. Multiplanar CT image reconstructions and MIPs were obtained to evaluate the vascular anatomy. RADIATION DOSE REDUCTION: This exam was performed according to the departmental dose-optimization program which includes automated exposure control, adjustment of the mA and/or kV according to patient size and/or use of iterative reconstruction technique. CONTRAST:  75mL OMNIPAQUE  IOHEXOL  350 MG/ML SOLN COMPARISON:  Chest radiograph dated 06/16/2024. FINDINGS: Cardiovascular: There is no cardiomegaly. Small pericardial effusion. There is coronary vascular calcification. Moderate atherosclerotic calcification of the thoracic aorta. No aneurysmal dilatation. No pulmonary artery embolus identified. Mediastinum/Nodes: No hilar adenopathy. The esophagus is  grossly unremarkable. No mediastinal fluid collection. Lungs/Pleura: Moderate right and small left pleural effusions with partial compressive atelectasis of the lower lobes. Pneumonia is not excluded. There is diffuse interstitial and interlobular septal prominence consistent with edema. No pneumothorax. The central airways are patent. Upper Abdomen: No acute abnormality. Musculoskeletal: No acute osseous pathology. Review of the MIP images confirms the above findings. IMPRESSION: 1. No CT evidence of pulmonary artery embolus. 2. Interstitial edema with bilateral pleural effusions. 3.  Aortic Atherosclerosis (ICD10-I70.0). Electronically Signed   By: Vanetta Chou M.D.   On: 06/16/2024 11:37   DG Chest Port 1 View Result Date: 06/16/2024 CLINICAL DATA:  Cough and shortness of breath EXAM: PORTABLE CHEST 1 VIEW COMPARISON:  Chest radiograph dated 03/20/2024 FINDINGS: Unchanged coarse rounded calcification corresponding to known right thyroid nodule. Normal lung volumes. Mild diffuse interstitial opacities. Blunting of bilateral costophrenic angles. Enlarged cardiomediastinal silhouette. No acute osseous abnormality. IMPRESSION: 1. Mild diffuse interstitial opacities, which may represent pulmonary edema. 2. Blunting of bilateral costophrenic angles, which may represent small pleural effusions. 3. Cardiomegaly. Electronically Signed   By: Limin  Xu M.D.  On: 06/16/2024 10:26     Assessment and Plan: Acute HFrEF Hx of Bilateral pleural effusion ECHO 2019 with EF 60-65%.  Echocardiogram in May 2025 showed LVEF 35 to 40%, new onset cardiomyopathy, CVP 3 mmHg, and small pericardial effusion 04/2024 CT cardiac showed bilateral pleural effusion, right greater than left. BNP 2829.  CXR suggestive of pulmonary edema and small pleural effusion.  CTA noted interstitial edema with bilateral pleural effusion. Reports worsening DOE, orthopnea, and cough (sometimes nonproductive and other times clear phlegm) X 2  weeks.  Notes ongoing SOB since 03/2024 but now with minimal exertion, at rest and when coughing.  Notes worsening symptoms when switched to Lasix  PRN.  Inhaler offers some improvement but not completely resolved.  After receiving first breathing treatment this hospitalization, notes SOB resolved within 10 minutes. Reports yesterday her home weight was 116 lbs and denies any significant weight gain.  ED treated with IV Lasix  40 mg x1.  I/O: +600 ml, wt 113 >111 (05/20/2024 120 lbs), CR 1.01 now 1.2, Red Vest > 40 She reports good urine output but no documented outputs. Will notify RN to follow.   Continue monitoring I's/O's, daily weights, renal function, and Reds Vest.  Visually appears euvolemic but diminished lung sound on right base more than left. Suspect volume overload more related to pleural effusion. Can consider repeat CXR prior to discharge.  Continue on IV Lasix  40 mg daily, Toprol -XL 25 mg daily Not on Entresto  a due to 2 syncopal events.  No recurrence of syncopal episodes.  Holding Farxiga  due to recent UTI.  Would continue to avoid since patient denies any prior history of UTIs.  Hypokalemia K 3.3 now 3, Mg 2.1 Potassium supplement scheduled this a.m.   Continue to follow.  Palpitations  Reports intermittent palpitations lasting for seconds about x1 per month, described as racing, and occurs at rest.  Drinks proximately 12 cups of coffee and 1-2 diet sodas per day. Tele: NSR vs ST with HR mainly 90-100's with PVC's No prior zio. Would benefit from outpatient monitor. Will discuss with MD.   CAD  HLD, LDL goal < 55 Elevated Troponin  Echocardiogram in May 2025 as above.  CT cardiac for ischemic evaluation showed Ca score of 477 (93 percentile) with proximal to mid RCA, 50% to 69% stenosis of the proximal OM1 and mid LAD, which were noted to be flow-limiting. Outpatient LHC on 7/7 showed multivessel CAD including sequential 50-60% prox/mid LAD including D2, sequential 50-70% OM2,  chronic total occulusion of mid RCA, and midly elevated LVEDP of 21 mmHG.  Recommended Imdur  for antianginal therapy, secondary prevention of CAD, escalation of GDMT for mixed ischemia and nonischemic cardiomyopathy, and increase furosemide  to 40 mg daily. TN 44 > 36.  EKG without acute ischemic changes.  Denies any chest pain.  No need for further ischemic evaluation at this time. Elevated TN most likely demand ischemia.  05/2024 LDL 53 Continue on ASA 81 mg daily, atorvastatin  80 mg daily, Imdur  15 mg daily, Toprol -XL as above Previously noted if LDL > 55, then patient was agreeable to start injectables.  Noted does not want to give shots to herself and would benefit from inclisiran.  HTN BP this am well controlled today:  114/86 Managed by GDMT as above.  Encourage heart healthy low sodium diet. Discussed limiting sodium intake to < 2 grams daily.     Tobacco use  Reports previously used to smoke 2 PPD but now reduced to <1/2 PPD.    Risk  Assessment/Risk Scores:   [place]  Heart Association (NYHA) Functional Class NYHA Class IV       For questions or updates, please contact [facility] Please consult [email or web address] for contact info under    Signed, Lorette CINDERELLA Kapur, PA-C  06/17/2024 7:32 AM

## 2024-06-17 NOTE — Plan of Care (Signed)
   Problem: Health Behavior/Discharge Planning: Goal: Ability to manage health-related needs will improve Outcome: Progressing

## 2024-06-17 NOTE — Plan of Care (Signed)
   Problem: Education: Goal: Knowledge of General Education information will improve Description Including pain rating scale, medication(s)/side effects and non-pharmacologic comfort measures Outcome: Progressing

## 2024-06-18 ENCOUNTER — Inpatient Hospital Stay (HOSPITAL_COMMUNITY)

## 2024-06-18 ENCOUNTER — Other Ambulatory Visit (HOSPITAL_COMMUNITY): Payer: Self-pay

## 2024-06-18 DIAGNOSIS — J90 Pleural effusion, not elsewhere classified: Secondary | ICD-10-CM | POA: Diagnosis not present

## 2024-06-18 DIAGNOSIS — I5023 Acute on chronic systolic (congestive) heart failure: Secondary | ICD-10-CM | POA: Diagnosis not present

## 2024-06-18 DIAGNOSIS — I5A Non-ischemic myocardial injury (non-traumatic): Secondary | ICD-10-CM | POA: Diagnosis not present

## 2024-06-18 DIAGNOSIS — E876 Hypokalemia: Secondary | ICD-10-CM | POA: Diagnosis not present

## 2024-06-18 DIAGNOSIS — I5043 Acute on chronic combined systolic (congestive) and diastolic (congestive) heart failure: Secondary | ICD-10-CM | POA: Diagnosis not present

## 2024-06-18 LAB — GLUCOSE, CAPILLARY
Glucose-Capillary: 264 mg/dL — ABNORMAL HIGH (ref 70–99)
Glucose-Capillary: 269 mg/dL — ABNORMAL HIGH (ref 70–99)
Glucose-Capillary: 117 mg/dL — ABNORMAL HIGH (ref 70–99)
Glucose-Capillary: 199 mg/dL — ABNORMAL HIGH (ref 70–99)

## 2024-06-18 LAB — BASIC METABOLIC PANEL WITH GFR
Anion gap: 12 (ref 5–15)
BUN: 24 mg/dL — ABNORMAL HIGH (ref 8–23)
CO2: 24 mmol/L (ref 22–32)
Calcium: 9.4 mg/dL (ref 8.9–10.3)
Chloride: 96 mmol/L — ABNORMAL LOW (ref 98–111)
Creatinine, Ser: 1.26 mg/dL — ABNORMAL HIGH (ref 0.44–1.00)
GFR, Estimated: 46 mL/min — ABNORMAL LOW (ref >60)
Glucose, Bld: 304 mg/dL — ABNORMAL HIGH (ref 70–99)
Potassium: 4.6 mmol/L (ref 3.5–5.1)
Sodium: 132 mmol/L — ABNORMAL LOW (ref 135–145)

## 2024-06-18 LAB — MAGNESIUM: Magnesium: 2.1 mg/dL (ref 1.7–2.4)

## 2024-06-18 LAB — TSH: TSH: 2.08 u[IU]/mL (ref 0.350–4.500)

## 2024-06-18 MED ORDER — METHOCARBAMOL 500 MG PO TABS
500.0000 mg | ORAL_TABLET | Freq: Four times a day (QID) | ORAL | Status: DC | PRN
  Administered 2024-06-18 – 2024-06-19 (×3): 500 mg via ORAL
  Filled 2024-06-18 (×3): qty 1

## 2024-06-18 MED ORDER — INSULIN ASPART 100 UNIT/ML IJ SOLN
0.0000 [IU] | Freq: Every day | INTRAMUSCULAR | Status: DC
  Administered 2024-06-19: 3 [IU] via SUBCUTANEOUS

## 2024-06-18 MED ORDER — ALPRAZOLAM 0.5 MG PO TABS
0.5000 mg | ORAL_TABLET | Freq: Every day | ORAL | Status: DC
  Administered 2024-06-18 – 2024-06-19 (×2): 0.5 mg via ORAL
  Filled 2024-06-18 (×2): qty 1

## 2024-06-18 MED ORDER — METOPROLOL SUCCINATE ER 25 MG PO TB24
25.0000 mg | ORAL_TABLET | Freq: Every day | ORAL | Status: DC
  Administered 2024-06-19: 25 mg via ORAL
  Filled 2024-06-18: qty 1

## 2024-06-18 MED ORDER — METOPROLOL SUCCINATE ER 50 MG PO TB24: 50.0000 mg | ORAL_TABLET | Freq: Every day | ORAL | Status: DC

## 2024-06-18 MED ORDER — LOSARTAN POTASSIUM 25 MG PO TABS
25.0000 mg | ORAL_TABLET | Freq: Every day | ORAL | Status: DC
  Administered 2024-06-18 – 2024-06-20 (×3): 25 mg via ORAL
  Filled 2024-06-18 (×3): qty 1

## 2024-06-18 MED ORDER — INSULIN ASPART 100 UNIT/ML IJ SOLN
0.0000 [IU] | Freq: Three times a day (TID) | INTRAMUSCULAR | Status: DC
  Administered 2024-06-18 – 2024-06-19 (×2): 5 [IU] via SUBCUTANEOUS
  Administered 2024-06-20: 2 [IU] via SUBCUTANEOUS

## 2024-06-18 NOTE — Evaluation (Signed)
 Physical Therapy Evaluation Patient Details Name: Deborah Cooley MRN: [id number] DOB: May 27, 1956 Today's Date: 06/18/2024  History of Present Illness  Deborah Cooley is a 68 year old female with a history of HFrEF (EF 35-40%), hypertension, tobacco abuse, non-Hodgkin's lymphoma, hyperlipidemia presenting with at least 1 week history of worsening shortness of breath.  The patient has been having PND and orthopnea symptoms to the point where she has had to get up to sleep in a recliner for the past week.  She denies any fevers, chills, chest pain, nausea, vomiting or diarrhea, abdominal pain.  She has a nonproductive cough.  She continues to smoke 1/2 pack/day.  She has smoked up to 2 packs/day x 50 years.  She denies any abdominal pain, hematochezia, melena.  Notably, the patient was hospitalized from 03/20/2024 to 03/23/2024 for Acute HFrEF.  She was discharged home with furosemide  20 mg daily.  Her discharge weight was 53.5 kg.  Since then, the patient underwent a left heart catheterization on 05/25/2024 which showed multivessel CAD with 50 to 60% of proximal/mid LAD, chronic CTO of the RCA, 50 to 70% of the OM 2.  Imdur  15 mg was added as well as aspirin  81 mg daily.   Clinical Impression  Patient functioning near/at baseline for functional mobility and gait demonstrates independence/modified independence for bed mobility, transfers, and ambulation. Patient presents with mild unsteadiness and dynamic balance deficits. Patient able to ambulate 38ft without an AD and functional ambulation limited mostly by fatigue. Patient experience slight SOB at the end of ambulation but SPO2 saturation held above 97% throughout duration of evaluation. Patient discharged to care of nursing for ambulation daily as tolerated for length of stay.         If plan is discharge home, recommend the following: Help with stairs or ramp for entrance   Can travel by private vehicle        Equipment Recommendations None  recommended by PT  Recommendations for Other Services       Functional Status Assessment Patient has had a recent decline in their functional status and/or demonstrates limited ability to make significant improvements in function in a reasonable and predictable amount of time     Precautions / Restrictions Precautions Precautions: Fall Recall of Precautions/Restrictions: Intact Restrictions Weight Bearing Restrictions Per Provider Order: No      Mobility  Bed Mobility Overal bed mobility: Modified Independent             General bed mobility comments: Unable to lie flat, used handrails    Transfers Overall transfer level: Independent Equipment used: None               General transfer comment: slightly labored, able to self complete    Ambulation/Gait Ambulation/Gait assistance: Independent Gait Distance (Feet): 60 Feet Assistive device: None Gait Pattern/deviations: Decreased step length - right, Decreased step length - left, Decreased stride length Gait velocity: decreased     General Gait Details: slow, short steps; otherwise good return for ambulating in room/hallway without loss of balance  Stairs            Wheelchair Mobility     Tilt Bed    Modified Rankin (Stroke Patients Only)       Balance Overall balance assessment: Needs assistance Sitting-balance support: Feet supported, No upper extremity supported Sitting balance-Leahy Scale: Good Sitting balance - Comments: seated EOB     Standing balance-Leahy Scale: Good Standing balance comment: fair/good without AD, slight unsteadiness on feet  Pertinent Vitals/Pain Pain Assessment Pain Assessment: No/denies pain    Home Living Family/patient expects to be discharged to:: Private residence Living Arrangements: Spouse/significant other Available Help at Discharge: Family Type of Home: Mobile home Home Access: Stairs to enter Entrance  Stairs-Rails: Right;Left;Can reach both Entrance Stairs-Number of Steps: 5   Home Layout: One level Home Equipment: None      Prior Function Prior Level of Function : Independent/Modified Independent             Mobility Comments: Reports being home, short-distance community ambulator without AD ADLs Comments: Reports independence with all ADLs; has required PRN recently     Extremity/Trunk Assessment        Lower Extremity Assessment Lower Extremity Assessment: Generalized weakness    Cervical / Trunk Assessment Cervical / Trunk Assessment: Normal  Communication   Communication Communication: No apparent difficulties    Cognition Arousal: Alert Behavior During Therapy: WFL for tasks assessed/performed   PT - Cognitive impairments: No apparent impairments                         Following commands: Intact       Cueing Cueing Techniques: Verbal cues     General Comments      Exercises     Assessment/Plan    PT Assessment All further PT needs can be met in the next venue of care  PT Problem List Decreased strength;Decreased balance;Decreased mobility;Decreased activity tolerance       PT Treatment Interventions      PT Goals (Current goals can be found in the Care Plan section)  Acute Rehab PT Goals Patient Stated Goal: Return home PT Goal Formulation: With patient/family Time For Goal Achievement: 06/18/24 Potential to Achieve Goals: Good    Frequency       Co-evaluation               AM-PAC PT 6 Clicks Mobility  Outcome Measure Help needed turning from your back to your side while in a flat bed without using bedrails?: None Help needed moving from lying on your back to sitting on the side of a flat bed without using bedrails?: None Help needed moving to and from a bed to a chair (including a wheelchair)?: None Help needed standing up from a chair using your arms (e.g., wheelchair or bedside chair)?: None Help needed to  walk in hospital room?: None Help needed climbing 3-5 steps with a railing? : A Little 6 Click Score: 23    End of Session Equipment Utilized During Treatment: Gait belt Activity Tolerance: Patient tolerated treatment well;Patient limited by fatigue Patient left: in bed;with family/visitor present;with call bell/phone within reach Nurse Communication: Mobility status PT Visit Diagnosis: Muscle weakness (generalized) (M62.81);Other abnormalities of gait and mobility (R26.89);Unsteadiness on feet (R26.81)    Time: 9047-8987 PT Time Calculation (min) (ACUTE ONLY): 20 min   Charges:   PT Evaluation $PT Eval Moderate Complexity: 1 Mod PT Treatments $Therapeutic Activity: 8-22 mins PT General Charges $$ ACUTE PT VISIT: 1 Visit         12:33 PM, 06/18/24,  Onnie Como, SPT

## 2024-06-18 NOTE — TOC Progression Note (Signed)
 Transition of Care [facility]) - Progression Note    Patient Details  Name: Deborah Cooley MRN: [id number] Date of Birth: 1956-04-27  Transition of Care [facility]) CM/SW Contact  Hoy DELENA Bigness, LCSW Phone Number: 06/18/2024, 12:49 PM  Clinical Narrative:    Attempted to meet with pt to discuss recommendation for HHPT. Pt had do not disturb sign placed on room door. CSW will attempt to meet with pt at a later time to discuss discharge plans.     Expected Discharge Plan: Home/Self Care Barriers to Discharge: Continued Medical Work up               Expected Discharge Plan and Services In-house Referral: Clinical Social Work     Living arrangements for the past 2 months: Single Family Home                                       Social Drivers of Health (SDOH) Interventions SDOH Screenings   Food Insecurity: No Food Insecurity (06/16/2024)  Housing: Low Risk  (06/16/2024)  Transportation Needs: No Transportation Needs (06/16/2024)  Utilities: Not At Risk (06/16/2024)  Financial Resource Strain: Low Risk  (05/20/2024)   Received from [facility]  Social Connections: Moderately Isolated (06/16/2024)  Tobacco Use: High Risk (06/16/2024)    Readmission Risk Interventions    06/17/2024   10:45 AM  Readmission Risk Prevention Plan  Transportation Screening Complete  Home Care Screening Complete  Medication Review (RN CM) Complete

## 2024-06-18 NOTE — Progress Notes (Signed)
 Heart Failure Stewardship Pharmacy Note  PCP: Adine Duwaine MATSU, FNP PCP-Cardiologist: Diannah SHAUNNA Maywood, MD  HPI: Deborah Cooley is a 68 y.o. female with CHF, CAD, hypertension, tobacco abuse, non-Hodgkin's lymphoma, hyperlipidemia  who presented with 1 week of progressive dyspnea. On admission, BNP was 2829, HS-troponin was 44, and A1c 6.8. Chest x-ray noted pulmonary edema and possible small pleural effusions.   Pertinent cardiac history: Stress echo in 05/2011 with LVEF of 60% and no evidence of ischemia. TTE 03/2024 showed LVEF down to 35-40%, small pericardial effusion. Coronary CT 04/2024 noted CAC score of 477 (93rd percentile) predominantly in prox-mid RCA. LHC 05/2024 showed multivessel CAD with 50-60% priximal/mid LAD involiving D2, 50-70% OM2 lesions, and CTO of mid RCA. LVEF noted to be out of proportion to CAD.   Pertinent Lab Values: Creatinine  Date Value Ref Range Status  02/12/2024 1.03 (H) 0.44 - 1.00 mg/dL Final   Creatinine, Ser  Date Value Ref Range Status  06/18/2024 1.26 (H) 0.44 - 1.00 mg/dL Final   BUN  Date Value Ref Range Status  06/18/2024 24 (H) 8 - 23 mg/dL Final   Potassium  Date Value Ref Range Status  06/18/2024 4.6 3.5 - 5.1 mmol/L Final   Sodium  Date Value Ref Range Status  06/18/2024 132 (L) 135 - 145 mmol/L Final   B Natriuretic Peptide  Date Value Ref Range Status  06/16/2024 2,829.0 (H) 0.0 - 100.0 pg/mL Final    Comment:    Performed at [facility], [address] [place], [place] [address]   Magnesium   Date Value Ref Range Status  06/18/2024 2.1 1.7 - 2.4 mg/dL Final    Comment:    Performed at [facility], [address], [place], [place] [address]   Hgb A1c MFr Bld  Date Value Ref Range Status  06/17/2024 6.8 (H) 4.8 - 5.6 % Final    Comment:    (NOTE) Diagnosis of Diabetes The following HbA1c ranges recommended by the American Diabetes Association (ADA) may be used as an aid in the diagnosis of diabetes  mellitus.  Hemoglobin             Suggested A1C NGSP%              Diagnosis  <5.7                   Non Diabetic  5.7-6.4                Pre-Diabetic  >6.4                   Diabetic  <7.0                   Glycemic control for                       adults with diabetes.     TSH  Date Value Ref Range Status  04/25/2018 9.837 (H) 0.350 - 4.500 uIU/mL Final    Comment:    Performed by a 3rd Generation assay with a functional sensitivity of <=0.01 uIU/mL. Performed at [facility], [address] [place], [place] [address]    LDH  Date Value Ref Range Status  02/12/2024 134 98 - 192 U/L Final    Comment:    Performed at [facility], [address], [place], [place] [address]    Vital Signs:  Temp:  [97.5 F (36.4  C)-97.6 F (36.4 C)] 97.5 F (36.4 C) (07/31 0512) Pulse Rate:  [95-110] 95 (07/31 0512) Cardiac Rhythm: Sinus tachycardia;Normal sinus rhythm (07/30 2100) Resp:  [18-20] 18 (07/31 0512) BP: (114-150)/(65-91) 137/66 (07/31 0512) SpO2:  [94 %-100 %] 98 % (07/31 0758) Weight:  [50.1 kg (110 lb 7.2 oz)] 50.1 kg (110 lb 7.2 oz) (07/31 0512)  Intake/Output Summary (Last 24 hours) at 06/18/2024 0820 Last data filed at 06/18/2024 9346 Gross per 24 hour  Intake 1200 ml  Output 2300 ml  Net -1100 ml    Current Heart Failure Medications:  Loop diuretic: furosemide  80 mg IV BID Beta-Blocker: metoprolol  succinate 50 mg daily ACEI/ARB/ARNI: none MRA: none SGLT2i: none Other: Imdur  15 mg daily  Prior to admission Heart Failure Medications:  Loop diuretic: furosemide  20 mg daily Beta-Blocker: metoprolol  succinate 25 mg daily ACEI/ARB/ARNI:  none MRA: none SGLT2i: Farxiga  10 mg daily Other: imdur  15 mg daily  Assessment: 1. Acute on chronic systolic heart failure (LVEF 35-40%)  , due to NICM. NYHA class III symptoms.  *Examination conducted via chart review and telephone due to off-site location. -Symptoms: Reports baseline 2  pillow orthopnea. Shortness of breath is mildly improved. Reports appetite is fine. Denies LEE. -Volume: Likely hypervolemic based on symptoms and previous imaging/labs. Currently on furosemide  80 mg IV BID with modest urine output. Creatinine and BUN are trending up -Hemodynamics: BP elevated. Sinus tachycardia with elevated BP. Would avoid BB titration at this time and consider afterload reduction. -BB: Metoprolol  50 mg daily ordered. Would consider reduction back to 25 mg daily while volume overloaded in sinus tachycardia. -ACEI/ARB/ARNI: Consider adding losartan  25 mg daily for afterload reduction. K up due to supp. -MRA: Consider adding spironolactone  tomorrow if K is stable.  -SGLT2i: Cost will be a barrier to Farxiga  given lack of medication coverage. Would likely qualify for patient assistance.  Plan: 1) Medication changes recommended at this time: -Consider reducing metoprolol  to 25 mg daily -Consider adding losartan  25 mg daily  2) Patient assistance: -Patient is uninsured. Patient would benefit from patient assistance if brand medications are initiated and social work to explore options for Medicare part D.  3) Education: - Patient has been educated on current HF medications and potential additions to HF medication regimen - Patient verbalizes understanding that over the next few months, these medication doses may change and more medications may be added to optimize HF regimen - Patient has been educated on basic disease state pathophysiology and goals of therapy  Medication Assistance / Insurance Benefits Check: Does the patient have prescription insurance?    Type of insurance plan:  Does the patient qualify for medication assistance through manufacturers or grants? Pending   Outpatient Pharmacy: Prior to admission outpatient pharmacy: [facility]      Please do not hesitate to reach out with questions or concerns,  Jaun Bash, PharmD, CPP, BCPS, [facility] Heart Failure  Pharmacist  Phone - [phone] 06/18/2024 10:30 AM

## 2024-06-18 NOTE — Progress Notes (Signed)
 Heart Failure Nurse Navigator Progress Note  PCP: Adine Duwaine MATSU, FNP PCP-Cardiologist: Diannah SHAUNNA Maywood, MD Admission Diagnosis: None Admitted from: Home via POV  [facility] patient. Heart Failure Eduction and Interview reviewed with patient via telephone.  Patient identity confirmed using 2 identifiers. Hospital follow-up scheduled at the HF clinic following discharge.   Presentation:   Deborah Cooley presented with shortness of breath, cough, congestion, weakness which has been progressively worse over the past month.  Some chest pain and coughing.  Patient had a cardiac cath on 05/25/24. Hx CHF, CAD, hypertension, tobacco use, non-Hodgkin's l;ymphoma, hyperlipidemia.  Chest x-ray noted pulmonary edema and possible small pleural effusions.  BNP 2829, HS Troponin was 44.    ECHO/ LVEF: 35-40% on 03/2024 TTE  Clinical Course:  Past Medical History:  Diagnosis Date   angioimmunoblastic lymphoma dx'd 03/2018   BCC (basal cell carcinoma of skin) 05/02/2023   left nasal crease - schedule for Mohs   BCC (basal cell carcinoma), face 05/02/2023   mid tip of nose - schedule for Mohs   Bilateral pleural effusion 04/17/2018   Endometriosis    Essential hypertension, benign    Family history of adverse reaction to anesthesia    sister stops breathing I think (04/18/2018)   Heart murmur    Mixed hyperlipidemia    Pneumonia 04/17/2018     Social History   Socioeconomic History   Marital status: Married    Spouse name: Not on file   Number of children: 1   Years of education: Not on file   Highest education level: GED or equivalent  Occupational History   Occupation: Designer, industrial/product    Comment: works at [employer]  Tobacco Use   Smoking status: Every Day    Current packs/day: 1.50    Average packs/day: 1.5 packs/day for 46.0 years (69.0 ttl pk-yrs)    Types: Cigarettes    Passive exposure: Current   Smokeless tobacco: Never  Vaping Use   Vaping status:  Never Used  Substance and Sexual Activity   Alcohol use: Not Currently    Comment: 04/18/2018 quit years ago   Drug use: Not Currently    Comment: back in my 40s   Sexual activity: Not Currently  Other Topics Concern   Not on file  Social History Narrative   ** Merged History Encounter **       Patient has one son whom is healthy.   Has a live in boyfriend.   Social Drivers of Corporate investment banker Strain: Low Risk  (05/20/2024)   Received from [facility]   Overall Financial Resource Strain (CARDIA)    Difficulty of Paying Living Expenses: Not hard at all  Food Insecurity: No Food Insecurity (06/16/2024)   Hunger Vital Sign    Worried About Running Out of Food in the Last Year: Never true    Ran Out of Food in the Last Year: Never true  Transportation Needs: No Transportation Needs (06/16/2024)   PRAPARE - Administrator, Civil Service (Medical): No    Lack of Transportation (Non-Medical): No  Physical Activity: Not on file  Stress: Not on file  Social Connections: Moderately Isolated (06/16/2024)   Social Connection and Isolation Panel    Frequency of Communication with Friends and Family: More than three times a week    Frequency of Social Gatherings with Friends and Family: More than three times a week    Attends Religious Services: Never  Active Member of Clubs or Organizations: No    Attends Banker Meetings: Never    Marital Status: Married   Water engineer and Provision:  Detailed education and instructions provided on heart failure disease management including the following:  Signs and symptoms of Heart Failure When to call the physician Importance of daily weights Low sodium diet Fluid restriction Medication management Anticipated future follow-up appointments  Patient education given on each of the above topics.  Patient acknowledges understanding via teach back method and acceptance of all instructions.  Education  Materials:  Living Better With Heart Failure Booklet, HF zone tool, & Daily Weight Tracker Tool.  Patient has scale at home: Yes Patient has pill box at home: Yes    High Risk Criteria for Readmission and/or Poor Patient Outcomes: Heart failure hospital admissions (last 6 months): 2  No Show rate: 0 Difficult social situation: None Demonstrates medication adherence: Yes Primary Language: English Literacy level: Reading, Writing & Comprehension  Barriers of Care:   Smoking Cessation-patient had cut back to 7 cigarettes a day then  went back to 1/2 PPD.   Considerations/Referrals:   Referral made to Heart Failure Pharmacist Stewardship: Yes Referral made to Heart Failure CSW/NCM TOC: No Referral made to Heart & Vascular TOC clinic: Yes. [facility] on 06/24/24 @ 8:15 AM.  Items for Follow-up on DC/TOC: Diet & Fluid Restrictions Daily Weights Smoking Cessation Continued Heart Failure Education  Charmaine Pines, RN, BSN [facility] Heart Failure Navigator Secure Chat Only

## 2024-06-18 NOTE — Plan of Care (Signed)
 Increased muscle cramping noted today, MD aware and medication administered. Poor po intake, pt states no appetite. Good urine output, pt states tired of having to pee so much! Educated on low salt diet, daily weight and documentation.   Problem: Education: Goal: Knowledge of General Education information will improve Description: Including pain rating scale, medication(s)/side effects and non-pharmacologic comfort measures Outcome: Progressing   Problem: Health Behavior/Discharge Planning: Goal: Ability to manage health-related needs will improve Outcome: Progressing   Problem: Clinical Measurements: Goal: Ability to maintain clinical measurements within normal limits will improve Outcome: Progressing Goal: Will remain free from infection Outcome: Progressing Goal: Diagnostic test results will improve Outcome: Progressing Goal: Respiratory complications will improve Outcome: Progressing Goal: Cardiovascular complication will be avoided Outcome: Progressing   Problem: Activity: Goal: Risk for activity intolerance will decrease Outcome: Progressing   Problem: Nutrition: Goal: Adequate nutrition will be maintained Outcome: Progressing   Problem: Coping: Goal: Level of anxiety will decrease Outcome: Progressing   Problem: Elimination: Goal: Will not experience complications related to bowel motility Outcome: Progressing Goal: Will not experience complications related to urinary retention Outcome: Progressing   Problem: Pain Managment: Goal: General experience of comfort will improve and/or be controlled Outcome: Progressing   Problem: Safety: Goal: Ability to remain free from injury will improve Outcome: Progressing   Problem: Skin Integrity: Goal: Risk for impaired skin integrity will decrease Outcome: Progressing   Problem: Education: Goal: Ability to describe self-care measures that may prevent or decrease complications (Diabetes Survival Skills Education)  will improve Outcome: Progressing Goal: Individualized Educational Video(s) Outcome: Progressing   Problem: Coping: Goal: Ability to adjust to condition or change in health will improve Outcome: Progressing   Problem: Fluid Volume: Goal: Ability to maintain a balanced intake and output will improve Outcome: Progressing   Problem: Health Behavior/Discharge Planning: Goal: Ability to identify and utilize available resources and services will improve Outcome: Progressing Goal: Ability to manage health-related needs will improve Outcome: Progressing   Problem: Metabolic: Goal: Ability to maintain appropriate glucose levels will improve Outcome: Progressing   Problem: Nutritional: Goal: Maintenance of adequate nutrition will improve Outcome: Progressing Goal: Progress toward achieving an optimal weight will improve Outcome: Progressing   Problem: Skin Integrity: Goal: Risk for impaired skin integrity will decrease Outcome: Progressing   Problem: Tissue Perfusion: Goal: Adequacy of tissue perfusion will improve Outcome: Progressing

## 2024-06-18 NOTE — Progress Notes (Signed)
 Rounding Note     Patient Name: Deborah Cooley Date of Encounter: 06/18/2024   [facility] Cardiologist: Vishnu P Mallipeddi, MD    Subjective Report no significant change since yesterday. Does note episodes of orthopnea throughout the night and being unable to sleep. Also, notes being very fatigued. She is able to get up in walk to bathroom without SOB. She wants to get up and walk around in the hall.    Scheduled Meds:  arformoterol   15 mcg Nebulization BID   aspirin  EC  81 mg Oral Daily   atorvastatin   80 mg Oral Daily   budesonide  (PULMICORT ) nebulizer solution  0.5 mg Nebulization BID   enoxaparin  (LOVENOX ) injection  40 mg Subcutaneous Q24H   furosemide   80 mg Intravenous BID   ipratropium-albuterol   3 mL Nebulization BID   isosorbide  mononitrate  15 mg Oral Daily   metoprolol  succinate  25 mg Oral QHS   nicotine   14 mg Transdermal Daily   potassium chloride   40 mEq Oral TID   sodium chloride  flush  3 mL Intravenous Q12H        Continuous Infusions:       PRN Meds: acetaminophen , ALPRAZolam , benzonatate , ipratropium-albuterol , ondansetron  (ZOFRAN ) IV, sodium chloride  flush        Vital Signs        Vitals:    06/17/24 1324 06/17/24 1627 06/17/24 2014 06/18/24 0512  BP: 136/65   (!) 150/91 137/66  Pulse: (!) 102   (!) 110 95  Resp: 20   20 18   Temp: (!) 97.5 F (36.4 C)   (!) 97.5 F (36.4 C) (!) 97.5 F (36.4 C)  TempSrc: Oral   Oral Oral  SpO2: 94% 96% 97% 94%  Weight:       50.1 kg  Height:              Intake/Output Summary (Last 24 hours) at 06/18/2024 0733 Last data filed at 06/18/2024 9346    Gross per 24 hour  Intake 1200 ml  Output 2300 ml  Net -1100 ml        06/18/2024    5:12 AM 06/17/2024    4:13 AM 06/16/2024    3:53 PM  Last 3 Weights  Weight (lbs) 110 lb 7.2 oz 111 lb 1.8 oz 113 lb 15.7 oz  Weight (kg) 50.1 kg 50.4 kg 51.7 kg       Telemetry NSR, HR 90-110's with episode of NSVT (5 beat, HR 140, 7/30 @ 1032), SVT  (7 beats, HR 150, 7/30 @1601 ) - Personally Reviewed   Physical Exam GEN: No acute distress.   Neck: No JVD Cardiac: RRR, no murmurs, rubs, or gallops.  Respiratory: improvement in lung sounds but still diminished on the right > left  GI: Soft, nontender, non-distended  MS: No edema; No deformity. Neuro:  Nonfocal  Psych: Normal affect    Labs High Sensitivity Troponin:   Last Labs      Recent Labs  Lab 06/16/24 0922 06/16/24 1111  TROPONINIHS 44* 36*       Chemistry Last Labs        Recent Labs  Lab 06/16/24 0922 06/16/24 1015 06/17/24 0431 06/18/24 0427  NA 133*  --  133* 132*  K 3.3*  --  3.0* 4.6  CL 94*  --  92* 96*  CO2 25  --  25 24  GLUCOSE 177*  --  287* 304*  BUN 14  --  19 24*  CREATININE 1.01*  --  1.20* 1.26*  CALCIUM  9.8  --  9.6 9.4  MG  --  2.1 2.0 2.1  PROT 6.8  --   --   --   ALBUMIN 4.0  --   --   --   AST 31  --   --   --   ALT 25  --   --   --   ALKPHOS 57  --   --   --   BILITOT 1.6*  --   --   --   GFRNONAA >60  --  49* 46*  ANIONGAP 14  --  16* 12      Lipids  Last Labs  No results for input(s): CHOL, TRIG, HDL, LABVLDL, LDLCALC, CHOLHDL in the last 168 hours.    Hematology Last Labs     Recent Labs  Lab 06/16/24 0922  WBC 8.3  RBC 5.00  HGB 12.9  HCT 41.0  MCV 82.0  MCH 25.8*  MCHC 31.5  RDW 17.0*  PLT 185      Thyroid  Last Labs  No results for input(s): TSH, FREET4 in the last 168 hours.    BNP Last Labs     Recent Labs  Lab 06/16/24 0922  BNP 2,829.0*      DDimer  Last Labs  No results for input(s): DDIMER in the last 168 hours.      Radiology   Imaging Results (Last 48 hours)  CT Angio Chest PE W/Cm &/Or Wo Cm Result Date: 06/16/2024 CLINICAL DATA:  Concern for pulmonary embolism. EXAM: CT ANGIOGRAPHY CHEST WITH CONTRAST TECHNIQUE: Multidetector CT imaging of the chest was performed using the standard protocol during bolus administration of intravenous contrast. Multiplanar CT  image reconstructions and MIPs were obtained to evaluate the vascular anatomy. RADIATION DOSE REDUCTION: This exam was performed according to the departmental dose-optimization program which includes automated exposure control, adjustment of the mA and/or kV according to patient size and/or use of iterative reconstruction technique. CONTRAST:  75mL OMNIPAQUE  IOHEXOL  350 MG/ML SOLN COMPARISON:  Chest radiograph dated 06/16/2024. FINDINGS: Cardiovascular: There is no cardiomegaly. Small pericardial effusion. There is coronary vascular calcification. Moderate atherosclerotic calcification of the thoracic aorta. No aneurysmal dilatation. No pulmonary artery embolus identified. Mediastinum/Nodes: No hilar adenopathy. The esophagus is grossly unremarkable. No mediastinal fluid collection. Lungs/Pleura: Moderate right and small left pleural effusions with partial compressive atelectasis of the lower lobes. Pneumonia is not excluded. There is diffuse interstitial and interlobular septal prominence consistent with edema. No pneumothorax. The central airways are patent. Upper Abdomen: No acute abnormality. Musculoskeletal: No acute osseous pathology. Review of the MIP images confirms the above findings. IMPRESSION: 1. No CT evidence of pulmonary artery embolus. 2. Interstitial edema with bilateral pleural effusions. 3.  Aortic Atherosclerosis (ICD10-I70.0). Electronically Signed   By: Vanetta Chou M.D.   On: 06/16/2024 11:37    DG Chest Port 1 View Result Date: 06/16/2024 CLINICAL DATA:  Cough and shortness of breath EXAM: PORTABLE CHEST 1 VIEW COMPARISON:  Chest radiograph dated 03/20/2024 FINDINGS: Unchanged coarse rounded calcification corresponding to known right thyroid nodule. Normal lung volumes. Mild diffuse interstitial opacities. Blunting of bilateral costophrenic angles. Enlarged cardiomediastinal silhouette. No acute osseous abnormality. IMPRESSION: 1. Mild diffuse interstitial opacities, which may  represent pulmonary edema. 2. Blunting of bilateral costophrenic angles, which may represent small pleural effusions. 3. Cardiomegaly. Electronically Signed   By: Limin  Xu M.D.   On: 06/16/2024 10:26       Cardiac Studies ECHO IMPRESSIONS 03/2024  1. Left ventricular ejection fraction, by estimation, is 35 to 40%. The  left ventricle has moderately decreased function. The left ventricle  demonstrates global hypokinesis. Left ventricular diastolic parameters are  indeterminate.   2. Right ventricular systolic function is normal. The right ventricular  size is normal. Tricuspid regurgitation signal is inadequate for assessing  PA pressure.   3. A small pericardial effusion is present. The pericardial effusion is  circumferential.   4. The mitral valve is normal in structure. Trivial mitral valve  regurgitation. No evidence of mitral stenosis.   5. The aortic valve is tricuspid. Aortic valve regurgitation is not  visualized. No aortic stenosis is present.   6. The inferior vena cava is normal in size with greater than 50%  respiratory variability, suggesting right atrial pressure of 3 mmHg.    Cardiac CT 04/2024 IMPRESSION: 1. Coronary calcium  score of 477. This was 93rd percentile for age-, sex, and race-matched controls. 2. Total plaque volume 809 mm3 which is 88th percentile for age- and sex-matched controls (calcified plaque 13mm3; non-calcified plaque 641mm3). TPV is extensive. 3. Normal coronary origin with right dominance. 4. Severe atherosclerosis: Subtotaled prox to mid RCA, 50-69% prox OM1 and mid LAD. 5. Recommend cardiac catheterization. 6. This study has been submitted for FFR analysis. IMPRESSION: 1. Coronary CTA FFR analysis demonstrates possible subtotal occlusion of the proximal RCA and possible hemodynamically significant flow limiting lesions in the LAD (0.84>>0.61) and OM1 (0.80>>0.68).   2. Recommend Cardiac Catheterization.   Cath  05/2024 Conclusions: Multivessel coronary artery disease, as detailed below, including sequential 50-60% proximal/mid LAD stenoses involving D2, sequential 50-70% OM2 lesions, and chronic total occlusion of mid RCA. Mildly elevated left ventricular filling pressure (LVEDP 21 mmHg).   Recommendations: Add isosorbide  mononitrate for antianginal therapy.  Continue secondary prevention of CAD, including smoking cessation. Continue escalation of goal-directed medical therapy for mixed ischemic and nonischemic cardiomyopathy (LVEF reduction is out of proportion to the extent of CAD). Change furosemide  to 20 mg daily.   Lonni Hanson, MD [facility]     Patient Profile   68 y.o. female with a hx of chronic HFrEF (EF 2019 60-65% then new onset cardiomyopathy 03/2024 35-40%), Hx of bilateral pleural effusion, CAD (05/25/2024: showed multivessel CAD including sequential 50-60% prox/mid LAD including D2, sequential 50-70% OM2, chronic total occulusion of mid RCA, and midly elevated LVEDP of 21 mmHG; medical management), HLD, HTN, non-Hodgkin lymphoma who is being seen 06/17/2024 for the evaluation of acute CHF at the request of Dr. Maree.    Assessment & Plan  Acute HFrEF Pleural Effusion Hx of Bilateral pleural effusion ECHO 2019 with EF 60-65%.  Echocardiogram in May 2025 showed LVEF 35 to 40%, new onset cardiomyopathy, CVP 3 mmHg, and small pericardial effusion 04/2024 CT cardiac showed bilateral pleural effusion, right greater than left. BNP 2829.  CXR suggestive of pulmonary edema and small pleural effusion.  CTA noted interstitial edema with bilateral pleural effusion. On consult,  reported worsening DOE, orthopnea, and cough (sometimes nonproductive and other times clear phlegm) X 2 weeks.  Notes ongoing SOB since 03/2024 but now with minimal exertion, at rest and when coughing.  Notes worsening symptoms when switched to Lasix  PRN.  Inhaler offers some improvement but not completely resolved.   After receiving first breathing treatment this hospitalization, notes SOB resolved within 10 minutes. Reported home weight 116 lbs on 7/29 and denies any significant weight gain.  This am, report no significant change since yesterday. Does note episodes of orthopnea throughout  the night and being unable to sleep. Also, notes being very fatigued. She is able to get up in walk to bathroom without SOB. She wants to get up and walk around in the hall.  Order PT evaluation to assess stability, SOB and O2 levels while ambulating.  I/O: -500 ml, wt 113 >111 >110 (05/20/2024 120 lbs), CR 1.01 > 1.2 > 1.26, Red Vest noted low quality today.  Continue monitoring I's/O's, daily weights, renal function, and Reds Vest.  Visually appears euvolemic.  Improvement in lung sounds but still diminished on the right > left. Suspect volume overload more related to pleural effusion.  Can consider repeat CXR prior to discharge.  On 7/30, increased IV lasix  from 40 mg to 80 mg BID. Continue.  Increase Toprol -XL 25 to 50 mg daily.  Not on Entresto  due to 2 syncopal events.  No recurrence of syncopal episodes.  Holding Farxiga  due to recent UTI.  Would continue to avoid since patient denies any prior history of UTIs.   Hypokalemia, resolved  K 3.3 >  3, Mg 2.1. Treated with Potassium supplement.  K this am 4.6.  Continue to follow.   Palpitations  PVC  NSVT  SVT Reports intermittent palpitations lasting for seconds about x1 per month, described as racing, and occurs at rest.  Drinks proximately 12 cups of coffee and 1-2 diet sodas per day. Tele 7/30: NSR vs ST with HR mainly 90-100's with PVC's  Tele 7/31: NSR, HR 90-110's with episode of NSVT (5 beat, HR 140, 7/30 @ 1032), SVT (7 beats, HR 150, 7/30 @1601 )  Encouraged to decrease caffeine intake and increase water intake.  Order TSH.  No prior zio. Would benefit from outpatient monitor. Will discuss with MD. Could be secondary to hypokalemia.    CAD  HLD, LDL goal  < 55 Elevated Troponin  Echocardiogram in May 2025 as above.  CT cardiac for ischemic evaluation showed Ca score of 477 (93 percentile) with proximal to mid RCA, 50% to 69% stenosis of the proximal OM1 and mid LAD, which were noted to be flow-limiting. Outpatient LHC on 7/7 showed multivessel CAD including sequential 50-60% prox/mid LAD including D2, sequential 50-70% OM2, chronic total occulusion of mid RCA, and midly elevated LVEDP of 21 mmHG.  Recommended Imdur  for antianginal therapy, secondary prevention of CAD, escalation of GDMT for mixed ischemia and nonischemic cardiomyopathy, and increase furosemide  to 40 mg daily. TN 44 > 36.  EKG without acute ischemic changes.  Denies any chest pain.  No need for further ischemic evaluation at this time. Elevated TN most likely demand ischemia from volume overload.  05/2024 LDL 53 Increase Toprol -XL 25 to 50 mg daily.  Continue on ASA 81 mg daily, atorvastatin  80 mg daily, Imdur  15 mg daily Previously noted if LDL > 55, then patient was agreeable to start injectables.  Noted does not want to give shots to herself and would benefit from inclisiran.   HTN BP this am mildly elevated today:  137/66 Managed by GDMT as above. Increase Toprol -XL 25 to 50 mg daily.  Encourage heart healthy low sodium diet. Discussed limiting sodium intake to < 2 grams daily.    Continue to monitor.    Tobacco use  Reports previously used to smoke 2 PPD but now reduced to <1/2 PPD.    For questions or updates, please contact [facility] Please consult [email or web address] for contact info under      Signed, Lorette CINDERELLA Kapur, PA-C  06/18/2024, 7:33 AM

## 2024-06-18 NOTE — Progress Notes (Signed)
 Pt called out complaining of severe cramps in her hands/forearms and thighs. Pt with noted drawing of bilateral hands. Pt states she was able to sleep for about an hour then the cramping woke her up. She got up to go to the bathroom and stated movement made the cramps worse. MD Maree notified of pt complaint.

## 2024-06-18 NOTE — Progress Notes (Signed)
 PROGRESS NOTE    Deborah Cooley  FMW:[id number] DOB: 04-May-1956 DOA: 06/16/2024 PCP: Deborah Duwaine MATSU, FNP   Brief Narrative:    Deborah Cooley is a 68 year old female with a history of HFrEF (EF 35-40%), hypertension, tobacco abuse, non-Hodgkin's lymphoma, hyperlipidemia presenting with at least 1 week history of worsening shortness of breath.  The patient has been having PND and orthopnea symptoms to the point where she has had to get up to sleep in a recliner for the past week.  She denies any fevers, chills, chest pain, nausea, vomiting or diarrhea, abdominal pain.  Patient was admitted with acute on chronic HFrEF.  Assessment & Plan:   Principal Problem:   Acute on chronic HFrEF (heart failure with reduced ejection fraction) (HCC) Active Problems:   Tobacco abuse   Chronic kidney disease, stage 3a (HCC)   Coronary artery disease  Assessment and Plan:   Acute on chronic HFrEF - 03/21/2024 echo EF 35-40%, global HK, normal RVF, small pericardial effusion - IV Lasix  increased to 80 mg twice daily by cardiology on 7/30 -Start losartan  25 mg daily on 7/31 -Chest x-ray per cardiology ordered and pending - Daily weights - Accurate I's and O's - Continue metoprolol  succinate - developed syncope with Entresto  - Holding Farxiga  temporarily in the setting of recent UTI   Coronary artery disease - No chest pain presently - 05/25/2024 LHC as discussed above - Continue aspirin  - Continue statin - Continue metoprolol  succinate - Continue Imdur  - Continue aspirin    Hyperglycemia/glycosuria - Recent A1c 6.3%   CKD stage IIIa - Baseline creatinine 0.9-1.1, currently 1.2 - Monitor with diuresis   COPD - Stable on room air - Start Brovana  - Start Pulmicort  - Start DuoNebs - Patient requesting nebulizer machine at the time of discharge   Essential hypertension - Continue metoprolol  succinate   Hyponatremia-stable - Secondary to fluid overload - Anticipate improvement  with diuresis   Elevated troponin -due to demand ischemia -no chest pain   Anxiety/insomnia -PDMP reviewed -xanax  0.5 mg, #60, refilled 05/20/24 -restart xanax , scheduled for bedtime    DVT prophylaxis:Lovenox  Code Status: Full Family Communication: Daughter at bedside 7/31 Disposition Plan:  Status is: Inpatient Remains inpatient appropriate because: Need for IV medications.   Consultants:  Cardiology  Procedures:  None  Antimicrobials:  None   Subjective: Patient seen and evaluated today with improvements in shortness of breath noted, she is having significant amounts of cramping.  Encouraged ambulation.  She is upset because she has not been sleeping much and wants to have her Xanax  at bedtime.  Objective: Vitals:   06/18/24 0512 06/18/24 0758 06/18/24 1105 06/18/24 1139  BP: 137/66  139/68   Pulse: 95  96 95  Resp: 18  20 18   Temp: (!) 97.5 F (36.4 C)   (!) 97.4 F (36.3 C)  TempSrc: Oral   Oral  SpO2: 94% 98% 97% 100%  Weight: 50.1 kg     Height:        Intake/Output Summary (Last 24 hours) at 06/18/2024 1405 Last data filed at 06/18/2024 1153 Gross per 24 hour  Intake 725 ml  Output 2500 ml  Net -1775 ml   Filed Weights   06/16/24 1553 06/17/24 0413 06/18/24 0512  Weight: 51.7 kg 50.4 kg 50.1 kg    Examination:  General exam: Appears calm and comfortable  Respiratory system: Clear to auscultation. Respiratory effort normal.  Currently on room air. Cardiovascular system: S1 & S2 heard, RRR.  Gastrointestinal system: Abdomen  is soft Central nervous system: Alert and awake Extremities: No edema Skin: No significant lesions noted Psychiatry: Flat affect.    Data Reviewed: I have personally reviewed following labs and imaging studies  CBC: Recent Labs  Lab 06/16/24 0922  WBC 8.3  NEUTROABS 5.4  HGB 12.9  HCT 41.0  MCV 82.0  PLT 185   Basic Metabolic Panel: Recent Labs  Lab 06/16/24 0922 06/16/24 1015 06/17/24 0431 06/18/24 0427   NA 133*  --  133* 132*  K 3.3*  --  3.0* 4.6  CL 94*  --  92* 96*  CO2 25  --  25 24  GLUCOSE 177*  --  287* 304*  BUN 14  --  19 24*  CREATININE 1.01*  --  1.20* 1.26*  CALCIUM  9.8  --  9.6 9.4  MG  --  2.1 2.0 2.1   GFR: Estimated Creatinine Clearance: 33.3 mL/min (A) (by C-G formula based on SCr of 1.26 mg/dL (H)). Liver Function Tests: Recent Labs  Lab 06/16/24 0922  AST 31  ALT 25  ALKPHOS 57  BILITOT 1.6*  PROT 6.8  ALBUMIN 4.0   No results for input(s): LIPASE, AMYLASE in the last 168 hours. No results for input(s): AMMONIA in the last 168 hours. Coagulation Profile: No results for input(s): INR, PROTIME in the last 168 hours. Cardiac Enzymes: No results for input(s): CKTOTAL, CKMB, CKMBINDEX, TROPONINI in the last 168 hours. BNP (last 3 results) No results for input(s): PROBNP in the last 8760 hours. HbA1C: Recent Labs    06/17/24 0431  HGBA1C 6.8*   CBG: Recent Labs  Lab 06/18/24 0956 06/18/24 1135  GLUCAP 264* 269*   Lipid Profile: No results for input(s): CHOL, HDL, LDLCALC, TRIG, CHOLHDL, LDLDIRECT in the last 72 hours. Thyroid Function Tests: Recent Labs    06/18/24 0427  TSH 2.080   Anemia Panel: No results for input(s): VITAMINB12, FOLATE, FERRITIN, TIBC, IRON, RETICCTPCT in the last 72 hours. Sepsis Labs: No results for input(s): PROCALCITON, LATICACIDVEN in the last 168 hours.  No results found for this or any previous visit (from the past 240 hours).       Radiology Studies: No results found.       Scheduled Meds:  ALPRAZolam   0.5 mg Oral QHS   arformoterol   15 mcg Nebulization BID   aspirin  EC  81 mg Oral Daily   atorvastatin   80 mg Oral Daily   budesonide  (PULMICORT ) nebulizer solution  0.5 mg Nebulization BID   enoxaparin  (LOVENOX ) injection  40 mg Subcutaneous Q24H   furosemide   80 mg Intravenous BID   insulin  aspart  0-5 Units Subcutaneous QHS   insulin  aspart  0-9  Units Subcutaneous TID WC   ipratropium-albuterol   3 mL Nebulization BID   isosorbide  mononitrate  15 mg Oral Daily   losartan   25 mg Oral Daily   metoprolol  succinate  25 mg Oral Daily   nicotine   14 mg Transdermal Daily   potassium chloride   40 mEq Oral TID   sodium chloride  flush  3 mL Intravenous Q12H     LOS: 2 days    Time spent: 55 minutes    Deborah Robidoux JONETTA Fairly, DO [facility]  If 7PM-7AM, please contact night-coverage [email or web address] 06/18/2024, 2:05 PM

## 2024-06-18 NOTE — Progress Notes (Signed)
 Orders received from MD Maree, pt medicated for muscle cramps. [employer] called and stated that pt had 9 beat run of PVC's. Pt awake, alert, no c/o chest pain or increased SOB. VSS.

## 2024-06-19 DIAGNOSIS — E876 Hypokalemia: Secondary | ICD-10-CM | POA: Diagnosis not present

## 2024-06-19 DIAGNOSIS — I5043 Acute on chronic combined systolic (congestive) and diastolic (congestive) heart failure: Secondary | ICD-10-CM | POA: Diagnosis not present

## 2024-06-19 DIAGNOSIS — J90 Pleural effusion, not elsewhere classified: Secondary | ICD-10-CM | POA: Diagnosis not present

## 2024-06-19 DIAGNOSIS — I5023 Acute on chronic systolic (congestive) heart failure: Secondary | ICD-10-CM | POA: Diagnosis not present

## 2024-06-19 LAB — BASIC METABOLIC PANEL WITH GFR
Anion gap: 13 (ref 5–15)
BUN: 24 mg/dL — ABNORMAL HIGH (ref 8–23)
CO2: 23 mmol/L (ref 22–32)
Calcium: 9.9 mg/dL (ref 8.9–10.3)
Chloride: 99 mmol/L (ref 98–111)
Creatinine, Ser: 1.34 mg/dL — ABNORMAL HIGH (ref 0.44–1.00)
GFR, Estimated: 43 mL/min — ABNORMAL LOW (ref >60)
Glucose, Bld: 161 mg/dL — ABNORMAL HIGH (ref 70–99)
Potassium: 5.6 mmol/L — ABNORMAL HIGH (ref 3.5–5.1)
Sodium: 135 mmol/L (ref 135–145)

## 2024-06-19 LAB — CBC
HCT: 43.4 % (ref 36.0–46.0)
Hemoglobin: 13.6 g/dL (ref 12.0–15.0)
MCH: 26.4 pg (ref 26.0–34.0)
MCHC: 31.3 g/dL (ref 30.0–36.0)
MCV: 84.1 fL (ref 80.0–100.0)
Platelets: 200 K/uL (ref 150–400)
RBC: 5.16 MIL/uL — ABNORMAL HIGH (ref 3.87–5.11)
RDW: 17.4 % — ABNORMAL HIGH (ref 11.5–15.5)
WBC: 9.7 K/uL (ref 4.0–10.5)
nRBC: 0 % (ref 0.0–0.2)

## 2024-06-19 LAB — GLUCOSE, CAPILLARY
Glucose-Capillary: 160 mg/dL — ABNORMAL HIGH (ref 70–99)
Glucose-Capillary: 137 mg/dL — ABNORMAL HIGH (ref 70–99)
Glucose-Capillary: 264 mg/dL — ABNORMAL HIGH (ref 70–99)
Glucose-Capillary: 264 mg/dL — ABNORMAL HIGH (ref 70–99)

## 2024-06-19 LAB — MAGNESIUM: Magnesium: 2.4 mg/dL (ref 1.7–2.4)

## 2024-06-19 MED ORDER — ENOXAPARIN SODIUM 30 MG/0.3ML IJ SOSY
30.0000 mg | PREFILLED_SYRINGE | INTRAMUSCULAR | Status: DC
  Filled 2024-06-19: qty 0.3

## 2024-06-19 NOTE — Plan of Care (Signed)

## 2024-06-19 NOTE — TOC Progression Note (Signed)
 Transition of Care [facility]) - Progression Note    Patient Details  Name: HOLLEIGH CRIHFIELD MRN: [id number] Date of Birth: 1955/12/13  [facility] CM/SW Contact  Hoy DELENA Bigness, LCSW Phone Number: 06/19/2024, 11:12 AM  Clinical Narrative:    Met with pt to discuss recommendation for [facility]. Pt declines for HH to be arranged. CSW encouraged pt to reach back out should she change her mind.   Expected Discharge Plan: Home/Self Care Barriers to Discharge: Continued Medical Work up               Expected Discharge Plan and Services In-house Referral: Clinical Social Work     Living arrangements for the past 2 months: Single Family Home                                       Social Drivers of Health (SDOH) Interventions SDOH Screenings   Food Insecurity: No Food Insecurity (06/16/2024)  Housing: Low Risk  (06/16/2024)  Transportation Needs: No Transportation Needs (06/16/2024)  Utilities: Not At Risk (06/16/2024)  Financial Resource Strain: Low Risk  (05/20/2024)   Received from [facility]  Social Connections: Moderately Isolated (06/16/2024)  Tobacco Use: High Risk (06/16/2024)    Readmission Risk Interventions    06/17/2024   10:45 AM  Readmission Risk Prevention Plan  Transportation Screening Complete  Home Care Screening Complete  Medication Review (RN CM) Complete

## 2024-06-19 NOTE — Progress Notes (Signed)
 PROGRESS NOTE    Deborah Cooley  FMW:[id number] DOB: 10-01-1956 DOA: 06/16/2024 PCP: Adine Duwaine MATSU, FNP   Brief Narrative:    Deborah Cooley is a 68 year old female with a history of HFrEF (EF 35-40%), hypertension, tobacco abuse, non-Hodgkin's lymphoma, hyperlipidemia presenting with at least 1 week history of worsening shortness of breath.  The patient has been having PND and orthopnea symptoms to the point where she has had to get up to sleep in a recliner for the past week.  She denies any fevers, chills, chest pain, nausea, vomiting or diarrhea, abdominal pain.  Patient was admitted with acute on chronic HFrEF.  Assessment & Plan:   Principal Problem:   Acute on chronic HFrEF (heart failure with reduced ejection fraction) (HCC) Active Problems:   Tobacco abuse   Chronic kidney disease, stage 3a (HCC)   Coronary artery disease  Assessment and Plan:   Acute on chronic HFrEF - 03/21/2024 echo EF 35-40%, global HK, normal RVF, small pericardial effusion - IV Lasix  increased to 80 mg twice daily by cardiology on 7/30, continue this for now and switch to torsemide by a.m. -Started losartan  25 mg daily on 7/31 -Chest x-ray per cardiology with bilateral pulmonary edema and effusions noted - Daily weights - Accurate I's and O's - Continue metoprolol  succinate - developed syncope with Entresto  - Holding Farxiga  temporarily in the setting of recent UTI   Coronary artery disease - No chest pain presently - 05/25/2024 LHC as discussed above - Continue aspirin  - Continue statin - Continue metoprolol  succinate - Continue Imdur  - Continue aspirin    Hyperglycemia/glycosuria - Recent A1c 6.3%   CKD stage IIIa - Baseline creatinine 0.9-1.1, currently 1.2 - Monitor with diuresis   COPD - Stable on room air - Start Brovana  - Start Pulmicort  - Start DuoNebs - Patient requesting nebulizer machine at the time of discharge   Essential hypertension - Continue metoprolol   succinate   Hyponatremia-stable - Secondary to fluid overload - Anticipate improvement with diuresis   Elevated troponin -due to demand ischemia -no chest pain   Anxiety/insomnia -PDMP reviewed -xanax  0.5 mg, #60, refilled 05/20/24 -restart xanax , scheduled for bedtime    DVT prophylaxis:Lovenox  Code Status: Full Family Communication: Daughter at bedside 7/31 Disposition Plan:  Status is: Inpatient Remains inpatient appropriate because: Need for IV medications.   Consultants:  Cardiology  Procedures:  None  Antimicrobials:  None   Subjective: Patient seen and evaluated today and appears more calm and has had better rest overnight.  Dyspnea has improved.  Objective: Vitals:   06/18/24 2009 06/18/24 2105 06/19/24 0446 06/19/24 0503  BP:  129/62 (!) 122/49   Pulse:  96 90   Resp:  20 18   Temp:  97.9 F (36.6 C) 97.8 F (36.6 C)   TempSrc:  Oral Oral   SpO2: 96% 97% 100%   Weight:    47.9 kg  Height:        Intake/Output Summary (Last 24 hours) at 06/19/2024 1133 Last data filed at 06/19/2024 0446 Gross per 24 hour  Intake --  Output 2300 ml  Net -2300 ml   Filed Weights   06/17/24 0413 06/18/24 0512 06/19/24 0503  Weight: 50.4 kg 50.1 kg 47.9 kg    Examination:  General exam: Appears calm and comfortable  Respiratory system: Clear to auscultation. Respiratory effort normal.  Currently on room air. Cardiovascular system: S1 & S2 heard, RRR.  Gastrointestinal system: Abdomen is soft Central nervous system: Alert and awake Extremities: No  edema Skin: No significant lesions noted Psychiatry: Flat affect.    Data Reviewed: I have personally reviewed following labs and imaging studies  CBC: Recent Labs  Lab 06/16/24 0922 06/19/24 0500  WBC 8.3 9.7  NEUTROABS 5.4  --   HGB 12.9 13.6  HCT 41.0 43.4  MCV 82.0 84.1  PLT 185 200   Basic Metabolic Panel: Recent Labs  Lab 06/16/24 0922 06/16/24 1015 06/17/24 0431 06/18/24 0427 06/19/24 0500   NA 133*  --  133* 132* 135  K 3.3*  --  3.0* 4.6 5.6*  CL 94*  --  92* 96* 99  CO2 25  --  25 24 23   GLUCOSE 177*  --  287* 304* 161*  BUN 14  --  19 24* 24*  CREATININE 1.01*  --  1.20* 1.26* 1.34*  CALCIUM  9.8  --  9.6 9.4 9.9  MG  --  2.1 2.0 2.1 2.4   GFR: Estimated Creatinine Clearance: 30 mL/min (A) (by C-G formula based on SCr of 1.34 mg/dL (H)). Liver Function Tests: Recent Labs  Lab 06/16/24 0922  AST 31  ALT 25  ALKPHOS 57  BILITOT 1.6*  PROT 6.8  ALBUMIN 4.0   No results for input(s): LIPASE, AMYLASE in the last 168 hours. No results for input(s): AMMONIA in the last 168 hours. Coagulation Profile: No results for input(s): INR, PROTIME in the last 168 hours. Cardiac Enzymes: No results for input(s): CKTOTAL, CKMB, CKMBINDEX, TROPONINI in the last 168 hours. BNP (last 3 results) No results for input(s): PROBNP in the last 8760 hours. HbA1C: Recent Labs    06/17/24 0431  HGBA1C 6.8*   CBG: Recent Labs  Lab 06/18/24 1135 06/18/24 1553 06/18/24 2040 06/19/24 0738 06/19/24 1123  GLUCAP 269* 117* 199* 160* 137*   Lipid Profile: No results for input(s): CHOL, HDL, LDLCALC, TRIG, CHOLHDL, LDLDIRECT in the last 72 hours. Thyroid Function Tests: Recent Labs    06/18/24 0427  TSH 2.080   Anemia Panel: No results for input(s): VITAMINB12, FOLATE, FERRITIN, TIBC, IRON, RETICCTPCT in the last 72 hours. Sepsis Labs: No results for input(s): PROCALCITON, LATICACIDVEN in the last 168 hours.  No results found for this or any previous visit (from the past 240 hours).       Radiology Studies: DG Chest Port 1 View Result Date: 06/18/2024 CLINICAL DATA:  Shortness of breath. EXAM: PORTABLE CHEST 1 VIEW COMPARISON:  June 16, 2024. FINDINGS: Stable cardiomediastinal silhouette. Mild central pulmonary vascular congestion and probable minimal bilateral pulmonary edema is noted. Small pleural effusions are noted.  Bony thorax is unremarkable. IMPRESSION: Mild central pulmonary vascular congestion and probable minimal bilateral pulmonary edema. Small pleural effusions. Electronically Signed   By: Lynwood Landy Raddle M.D.   On: 06/18/2024 14:25         Scheduled Meds:  ALPRAZolam   0.5 mg Oral QHS   arformoterol   15 mcg Nebulization BID   aspirin  EC  81 mg Oral Daily   atorvastatin   80 mg Oral Daily   budesonide  (PULMICORT ) nebulizer solution  0.5 mg Nebulization BID   enoxaparin  (LOVENOX ) injection  30 mg Subcutaneous Q24H   furosemide   80 mg Intravenous BID   insulin  aspart  0-5 Units Subcutaneous QHS   insulin  aspart  0-9 Units Subcutaneous TID WC   ipratropium-albuterol   3 mL Nebulization BID   losartan   25 mg Oral Daily   metoprolol  succinate  25 mg Oral Daily   nicotine   14 mg Transdermal Daily   sodium  chloride flush  3 mL Intravenous Q12H     LOS: 3 days    Time spent: 55 minutes    Tramaine Sauls JONETTA Fairly, DO [facility]  If 7PM-7AM, please contact night-coverage [email or web address] 06/19/2024, 11:33 AM

## 2024-06-19 NOTE — Care Management Important Message (Signed)
 Important Message  Patient Details  Name: Deborah Cooley MRN: [id number] Date of Birth: 23-Jan-1956   Important Message Given:  Yes - Medicare IM     [name] [name] 06/19/2024, 3:16 PM
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
## 2024-06-19 NOTE — Progress Notes (Signed)
 Heart Failure Stewardship Pharmacy Note  PCP: Adine Duwaine MATSU, FNP PCP-Cardiologist: Diannah SHAUNNA Maywood, MD  HPI: Deborah Cooley is a 68 y.o. female with CHF, CAD, hypertension, tobacco abuse, non-Hodgkin's lymphoma, hyperlipidemia  who presented with 1 week of progressive dyspnea. On admission, BNP was 2829, HS-troponin was 44, and A1c 6.8. Chest x-ray noted pulmonary edema and possible small pleural effusions.   Pertinent cardiac history: Stress echo in 05/2011 with LVEF of 60% and no evidence of ischemia. TTE 03/2024 showed LVEF down to 35-40%, small pericardial effusion. Coronary CT 04/2024 noted CAC score of 477 (93rd percentile) predominantly in prox-mid RCA. LHC 05/2024 showed multivessel CAD with 50-60% priximal/mid LAD involiving D2, 50-70% OM2 lesions, and CTO of mid RCA. LVEF noted to be out of proportion to CAD.   Pertinent Lab Values: Creatinine  Date Value Ref Range Status  02/12/2024 1.03 (H) 0.44 - 1.00 mg/dL Final   Creatinine, Ser  Date Value Ref Range Status  06/19/2024 1.34 (H) 0.44 - 1.00 mg/dL Final   BUN  Date Value Ref Range Status  06/19/2024 24 (H) 8 - 23 mg/dL Final   Potassium  Date Value Ref Range Status  06/19/2024 5.6 (H) 3.5 - 5.1 mmol/L Final   Sodium  Date Value Ref Range Status  06/19/2024 135 135 - 145 mmol/L Final   B Natriuretic Peptide  Date Value Ref Range Status  06/16/2024 2,829.0 (H) 0.0 - 100.0 pg/mL Final    Comment:    Performed at [facility], [address] [place], [place] [address]   Magnesium   Date Value Ref Range Status  06/19/2024 2.4 1.7 - 2.4 mg/dL Final    Comment:    Performed at [facility], [address], [place], [place] [address]   Hgb A1c MFr Bld  Date Value Ref Range Status  06/17/2024 6.8 (H) 4.8 - 5.6 % Final    Comment:    (NOTE) Diagnosis of Diabetes The following HbA1c ranges recommended by the American Diabetes Association (ADA) may be used as an aid in the diagnosis of diabetes  mellitus.  Hemoglobin             Suggested A1C NGSP%              Diagnosis  <5.7                   Non Diabetic  5.7-6.4                Pre-Diabetic  >6.4                   Diabetic  <7.0                   Glycemic control for                       adults with diabetes.     TSH  Date Value Ref Range Status  06/18/2024 2.080 0.350 - 4.500 uIU/mL Final    Comment:    Performed by a 3rd Generation assay with a functional sensitivity of <=0.01 uIU/mL. Performed at [facility], [address] [place], [place] [address]    LDH  Date Value Ref Range Status  02/12/2024 134 98 - 192 U/L Final    Comment:    Performed at [facility], [address] [place], [place] [address]    Vital Signs:  Temp:  [97.8 F (36.6 C)-98 F (36.7  C)] 97.8 F (36.6 C) (08/01 0446) Pulse Rate:  [88-96] 90 (08/01 0446) Cardiac Rhythm: Normal sinus rhythm (08/01 0840) Resp:  [15-20] 18 (08/01 0446) BP: (117-129)/(49-76) 122/49 (08/01 0446) SpO2:  [96 %-100 %] 100 % (08/01 0446) Weight:  [47.9 kg (105 lb 11.2 oz)] 47.9 kg (105 lb 11.2 oz) (08/01 0503)  Intake/Output Summary (Last 24 hours) at 06/19/2024 1158 Last data filed at 06/19/2024 0446 Gross per 24 hour  Intake --  Output 2100 ml  Net -2100 ml    Current Heart Failure Medications:  Loop diuretic: furosemide  80 mg IV BID Beta-Blocker: metoprolol  succinate 25 mg daily ACEI/ARB/ARNI: losartan  25 mg daily MRA: none SGLT2i: none Other: Imdur  15 mg daily  Prior to admission Heart Failure Medications:  Loop diuretic: furosemide  20 mg daily Beta-Blocker: metoprolol  succinate 25 mg daily ACEI/ARB/ARNI:  none MRA: none SGLT2i: Farxiga  10 mg daily Other: Imdur  15 mg daily  Assessment: 1. Acute on chronic systolic heart failure (LVEF 35-40%)  , due to NICM. NYHA class III symptoms.  *Examination conducted via chart review and telephone due to off-site location. -Symptoms: Reports baseline 2 pillow orthopnea.  Shortness of breath is much improved. Reports appetite is fine. Denies LEE. Reports better sleep quality last night. -Volume: Chest XR yesterday noted mild central pulmonary vascular congestion and minimal bilateral pulmonary edema. Currently on furosemide  80 mg IV BID with modest urine output. Creatinine and BUN are trending up. Can consider transition to oral diuretics tomorrow pending REDS.  -Hemodynamics: BP elevated. Sinus tachycardia with elevated BP. Would avoid BB titration at this time and consider afterload reduction. -BB: Metoprolol  50 mg daily ordered. Would consider reduction back to 25 mg daily while volume overloaded in sinus tachycardia. -ACEI/ARB/ARNI: Continue losartan  25 mg daily at this time. Monitor Scr and K in the AM. K likely up due to TID KCL supp, now stopped. -MRA: Not a candidate a this time due to hyperkalemia with 3 doses of oral KCL yesterday. -SGLT2i: Cost will be a barrier to Farxiga  given lack of medication coverage. Would likely qualify for patient assistance.  Plan: 1) Medication changes recommended at this time: -Can consider transition to oral torsemide if REDS 35 or less.  2) Patient assistance: -Patient is uninsured. Patient would benefit from patient assistance if brand medications are initiated and social work to explore options for Medicare part D.  3) Education: - Patient has been educated on current HF medications and potential additions to HF medication regimen - Patient verbalizes understanding that over the next few months, these medication doses may change and more medications may be added to optimize HF regimen - Patient has been educated on basic disease state pathophysiology and goals of therapy  Medication Assistance / Insurance Benefits Check: Does the patient have prescription insurance?    Type of insurance plan:  Does the patient qualify for medication assistance through manufacturers or grants? Pending   Outpatient Pharmacy: Prior  to admission outpatient pharmacy: [facility]      Please do not hesitate to reach out with questions or concerns,  Jaun Bash, PharmD, CPP, BCPS, [facility] Heart Failure Pharmacist  Phone - [phone] 06/19/2024 11:58 AM

## 2024-06-19 NOTE — Progress Notes (Signed)
 Progress Note  Patient Name: Deborah Cooley Date of Encounter: 06/19/2024  Primary Cardiologist: Shakevia Sarris P Hosie Sharman, MD  Subjective   Slept well.  In good spirits today.  Chest x-ray from yesterday showed improved pulmonary vascular congestion and bilateral pleural effusions.  Inpatient Medications    Scheduled Meds:  ALPRAZolam   0.5 mg Oral QHS   arformoterol   15 mcg Nebulization BID   aspirin  EC  81 mg Oral Daily   atorvastatin   80 mg Oral Daily   budesonide  (PULMICORT ) nebulizer solution  0.5 mg Nebulization BID   enoxaparin  (LOVENOX ) injection  30 mg Subcutaneous Q24H   furosemide   80 mg Intravenous BID   insulin  aspart  0-5 Units Subcutaneous QHS   insulin  aspart  0-9 Units Subcutaneous TID WC   ipratropium-albuterol   3 mL Nebulization BID   isosorbide  mononitrate  15 mg Oral Daily   losartan   25 mg Oral Daily   metoprolol  succinate  25 mg Oral Daily   nicotine   14 mg Transdermal Daily   sodium chloride  flush  3 mL Intravenous Q12H   Continuous Infusions:  PRN Meds: acetaminophen , benzonatate , ipratropium-albuterol , methocarbamol , ondansetron  (ZOFRAN ) IV, sodium chloride  flush   Vital Signs    Vitals:   06/18/24 2009 06/18/24 2105 06/19/24 0446 06/19/24 0503  BP:  129/62 (!) 122/49   Pulse:  96 90   Resp:  20 18   Temp:  97.9 F (36.6 C) 97.8 F (36.6 C)   TempSrc:  Oral Oral   SpO2: 96% 97% 100%   Weight:    47.9 kg  Height:        Intake/Output Summary (Last 24 hours) at 06/19/2024 1031 Last data filed at 06/19/2024 0446 Gross per 24 hour  Intake 5 ml  Output 2300 ml  Net -2295 ml   Filed Weights   06/17/24 0413 06/18/24 0512 06/19/24 0503  Weight: 50.4 kg 50.1 kg 47.9 kg    Telemetry     Personally reviewed.  NSR, HR 90s.  ECG    Not performed today.  Physical Exam   GEN: No acute distress.   Neck:  JVD+. Cardiac: RRR, no murmur, rub, or gallop.  Respiratory: Nonlabored. Clear to auscultation bilaterally. GI: Soft, nontender,  bowel sounds present. MS: No edema; No deformity. Neuro:  Nonfocal. Psych: Alert and oriented x 3. Normal affect.  Labs    Chemistry Recent Labs  Lab 06/16/24 [phone] 06/17/24 0431 06/18/24 0427 06/19/24 0500  NA 133* 133* 132* 135  K 3.3* 3.0* 4.6 5.6*  CL 94* 92* 96* 99  CO2 25 25 24 23   GLUCOSE 177* 287* 304* 161*  BUN 14 19 24* 24*  CREATININE 1.01* 1.20* 1.26* 1.34*  CALCIUM  9.8 9.6 9.4 9.9  PROT 6.8  --   --   --   ALBUMIN 4.0  --   --   --   AST 31  --   --   --   ALT 25  --   --   --   ALKPHOS 57  --   --   --   BILITOT 1.6*  --   --   --   GFRNONAA >60 49* 46* 43*  ANIONGAP 14 16* 12 13     Hematology Recent Labs  Lab 06/16/24 0922 06/19/24 0500  WBC 8.3 9.7  RBC 5.00 5.16*  HGB 12.9 13.6  HCT 41.0 43.4  MCV 82.0 84.1  MCH 25.8* 26.4  MCHC 31.5 31.3  RDW 17.0* 17.4*  PLT 185  200    Cardiac Enzymes Recent Labs  Lab 06/16/24 0922 06/16/24 1111  TROPONINIHS 44* 36*    BNP Recent Labs  Lab 06/16/24 0922  BNP 2,829.0*     DDimerNo results for input(s): DDIMER in the last 168 hours.   Radiology    DG Chest Port 1 View Result Date: 06/18/2024 CLINICAL DATA:  Shortness of breath. EXAM: PORTABLE CHEST 1 VIEW COMPARISON:  June 16, 2024. FINDINGS: Stable cardiomediastinal silhouette. Mild central pulmonary vascular congestion and probable minimal bilateral pulmonary edema is noted. Small pleural effusions are noted. Bony thorax is unremarkable. IMPRESSION: Mild central pulmonary vascular congestion and probable minimal bilateral pulmonary edema. Small pleural effusions. Electronically Signed   By: Lynwood Landy Raddle M.D.   On: 06/18/2024 14:25    Assessment & Plan   Acute on chronic systolic and diastolic heart failure: Presented with DOE, orthopnea and productive cough since May 2025.  Imaging showed pulmonary vascular congestion and R>L pleural effusion. BNP 2,829.  Total bilirubin 1.6.  Initially on IV Lasix  40 mg.  It was switched to IV Lasix  80  mg twice daily on 06/17/2024.  2.3L urine output in the last 24 hours with net -2.29 L.  Chest x-ray obtained yesterday showed improved pulmonary vascular congestion and improved pleural effusions bilaterally but still has some pulmonary edema.  Received IV Lasix  80 mg this a.m., switch to p.o. torsemide 20 mg once daily starting tomorrow if Reds Vest is normal.  Serum creatinine is mildly increased today compared to yesterday, likely secondary to initiation of losartan  yesterday.  Continue metoprolol  succinate 25 mg once daily, losartan  25 mg once daily.  Did not tolerate Entresto  due to syncope from hypotension.  Did not tolerate Farxiga  due to UTI.  She will need outpatient referral to advanced heart failure upon discharge.   Pleural effusion: Imaging showed pulmonary vascular congestion and R>L pleural effusion.  Chest x-ray yesterday showed improved pulmonary vascular congestion and small bilateral pleural effusions.   New diagnosis of DM2: HbA1c on 06/17/2024 was 6.8.  Around 3 months ago, it was 6.3.  Management of DM 2 per primary team.   Hyperkalemia: She was hypokalemic earlier and now hyperkalemic.  Asymptomatic.  Telemetry reviewed, no NSVT.   Multivessel CAD: She never had any angina.  She underwent LHC in July 2025 that showed RCA CTO, sequential 50 to 70% OM 2 lesions, sequential 50 to 60% proximal/mid LAD stenosis involving diagonal vessel.  Plan was medical management.  Imdur  was added for antianginal therapy but she never had angina.  Stop Imdur .  She reported having chest pains when she was coughing and a cramp/soreness under her left breast and not angina.   Myocardial injury: Hs troponins mildly elevated, 44>>36.  Likely demand ischemia from volume overload.  Signed, Diannah SHAUNNA Maywood, MD  06/19/2024, 10:31 AM

## 2024-06-20 DIAGNOSIS — I5023 Acute on chronic systolic (congestive) heart failure: Secondary | ICD-10-CM | POA: Diagnosis not present

## 2024-06-20 LAB — BASIC METABOLIC PANEL WITH GFR
Anion gap: 15 (ref 5–15)
BUN: 25 mg/dL — ABNORMAL HIGH (ref 8–23)
CO2: 22 mmol/L (ref 22–32)
Calcium: 9.4 mg/dL (ref 8.9–10.3)
Chloride: 96 mmol/L — ABNORMAL LOW (ref 98–111)
Creatinine, Ser: 1.2 mg/dL — ABNORMAL HIGH (ref 0.44–1.00)
GFR, Estimated: 49 mL/min — ABNORMAL LOW (ref >60)
Glucose, Bld: 146 mg/dL — ABNORMAL HIGH (ref 70–99)
Potassium: 3.2 mmol/L — ABNORMAL LOW (ref 3.5–5.1)
Sodium: 133 mmol/L — ABNORMAL LOW (ref 135–145)

## 2024-06-20 LAB — GLUCOSE, CAPILLARY: Glucose-Capillary: 167 mg/dL — ABNORMAL HIGH (ref 70–99)

## 2024-06-20 LAB — MAGNESIUM: Magnesium: 2.2 mg/dL (ref 1.7–2.4)

## 2024-06-20 MED ORDER — POTASSIUM CHLORIDE CRYS ER 20 MEQ PO TBCR
40.0000 meq | EXTENDED_RELEASE_TABLET | Freq: Once | ORAL | Status: AC
  Administered 2024-06-20: 40 meq via ORAL
  Filled 2024-06-20: qty 2

## 2024-06-20 MED ORDER — POTASSIUM CHLORIDE CRYS ER 20 MEQ PO TBCR: 20.0000 meq | EXTENDED_RELEASE_TABLET | Freq: Every day | ORAL | 1 refills | Status: AC

## 2024-06-20 MED ORDER — METHOCARBAMOL 500 MG PO TABS: 500.0000 mg | ORAL_TABLET | Freq: Four times a day (QID) | ORAL | 0 refills | Status: DC | PRN

## 2024-06-20 MED ORDER — LOSARTAN POTASSIUM 25 MG PO TABS: 25.0000 mg | ORAL_TABLET | Freq: Every day | ORAL | 2 refills | Status: AC

## 2024-06-20 MED ORDER — TORSEMIDE 20 MG PO TABS: 20.0000 mg | ORAL_TABLET | Freq: Every day | ORAL | 2 refills | Status: DC

## 2024-06-20 NOTE — Progress Notes (Signed)
 Pt has DC order, AVS was given and explained to pt. Pt is trying to contact husband for transport.

## 2024-06-20 NOTE — Progress Notes (Signed)
   06/19/24 2139  Assess: MEWS Score  Temp 98 F (36.7 C)  BP (!) 146/63  MAP (mmHg) 88  Pulse Rate (!) 117  Resp 20  SpO2 100 %  O2 Device Room Air  Assess: MEWS Score  MEWS Temp 0  MEWS Systolic 0  MEWS Pulse 2  MEWS RR 0  MEWS LOC 0  MEWS Score 2  MEWS Score Color Yellow  Assess: if the MEWS score is Yellow or Red  Were vital signs accurate and taken at a resting state? Yes  Does the patient meet 2 or more of the SIRS criteria? No  MEWS guidelines implemented  Yes, yellow  Treat  MEWS Interventions Considered administering scheduled or prn medications/treatments as ordered  Take Vital Signs  Increase Vital Sign Frequency  Yellow: Q2hr x1, continue Q4hrs until patient remains green for 12hrs  Escalate  MEWS: Escalate Yellow: Discuss with charge nurse and consider notifying provider and/or RRT  Notify: Charge Nurse/RN  Name of Charge Nurse/RN Notified Gerard LOISE Schneiders, RN  Provider Notification  Provider Name/Title Mauricio Toribio Furnace, MD  Date Provider Notified 06/19/24  Time Provider Notified 2125  Method of Notification Page  Notification Reason Change in status (Yellow MEWS)  Provider response No new orders;Evaluate remotely  Date of Provider Response 06/19/24  Time of Provider Response 2125  Assess: SIRS CRITERIA  SIRS Temperature  0  SIRS Respirations  0  SIRS Pulse 1  SIRS WBC 0  SIRS Score Sum  1

## 2024-06-20 NOTE — Discharge Summary (Signed)
 Physician Discharge Summary  Deborah Cooley FMW:[id number] DOB: 1956/05/19 DOA: 06/16/2024  PCP: Adine Duwaine MATSU, FNP  Admit date: 06/16/2024  Discharge date: 06/20/2024  Admitted From:Home  Disposition:  Home  Recommendations for Outpatient Follow-up:  Follow up with cardiology as scheduled on 8/6 at 8:15 AM Continue on torsemide  20 mg daily as prescribed as well as losartan  25 mg daily and hold Imdur  Continue potassium supplementation Continue other home medications as prior Robaxin  provided as needed for muscle cramps and spasms Holding Farxiga  temporarily in the setting of recent UTI, may resume outpatient per cardiology discretion  Home Health: None  Equipment/Devices: None  Discharge Condition:Stable  CODE STATUS: Full  Diet recommendation: Heart Healthy  Brief/Interim Summary: Deborah Cooley is a 68 year old female with a history of HFrEF (EF 35-40%), hypertension, tobacco abuse, non-Hodgkin's lymphoma, hyperlipidemia presenting with at least 1 week history of worsening shortness of breath. The patient has been having PND and orthopnea symptoms to the point where she has had to get up to sleep in a recliner for the past week. She denies any fevers, chills, chest pain, nausea, vomiting or diarrhea, abdominal pain. Patient was admitted with acute on chronic HFrEF.  She was diuresed aggressively with IV Lasix  and followed by cardiology during her stay.  She is now euvolemic and appears ready for discharge.  No other acute events or concerns noted.  She will be started on torsemide  20 mg daily per cardiology recommendations.  Discharge Diagnoses:  Principal Problem:   Acute on chronic HFrEF (heart failure with reduced ejection fraction) (HCC) Active Problems:   Tobacco abuse   Chronic kidney disease, stage 3a (HCC)   Coronary artery disease  Principal discharge diagnosis: Acute on chronic HFrEF.  Discharge Instructions  Discharge Instructions     Diet - low  sodium heart healthy   Complete by: As directed    Increase activity slowly   Complete by: As directed       Allergies as of 06/20/2024       Reactions   Ambien  [zolpidem ] Other (See Comments)    Confusion, lightheadedness, dizzy, bad dreams   Hydrocodone Other (See Comments)   Patient states it gives her night mares        Medication List     STOP taking these medications    dapagliflozin  propanediol 10 MG Tabs tablet Commonly known as: FARXIGA    furosemide  20 MG tablet Commonly known as: LASIX    isosorbide  mononitrate 30 MG 24 hr tablet Commonly known as: IMDUR    nitrofurantoin (macrocrystal-monohydrate) 100 MG capsule Commonly known as: MACROBID       TAKE these medications    albuterol  108 (90 Base) MCG/ACT inhaler Commonly known as: VENTOLIN  HFA Inhale 2 puffs into the lungs every 6 (six) hours as needed for wheezing or shortness of breath.   ALPRAZolam  0.5 MG tablet Commonly known as: XANAX  Take 0.5 mg by mouth 2 (two) times daily as needed for anxiety or sleep.   aspirin  EC 81 MG tablet Take 1 tablet (81 mg total) by mouth daily. Swallow whole.   atorvastatin  80 MG tablet Commonly known as: LIPITOR Take 1 tablet (80 mg total) by mouth daily.   losartan  25 MG tablet Commonly known as: COZAAR  Take 1 tablet (25 mg total) by mouth daily. Start taking on: June 21, 2024   methocarbamol  500 MG tablet Commonly known as: ROBAXIN  Take 1 tablet (500 mg total) by mouth every 6 (six) hours as needed for muscle spasms.   metoprolol   succinate 25 MG 24 hr tablet Commonly known as: TOPROL -XL Take 1 tablet (25 mg total) by mouth at bedtime. Take with or immediately following a meal. What changed: when to take this   nitroGLYCERIN  0.4 MG SL tablet Commonly known as: NITROSTAT  Place 1 tablet (0.4 mg total) under the tongue every 5 (five) minutes as needed for chest pain. If a single episode of chest pain is not relieved by one tablet, the patient will try  another within 5 minutes; and if this doesn't relieve the pain, the patient is instructed to call 911 for transportation to an emergency department.   potassium chloride  SA 20 MEQ tablet Commonly known as: KLOR-CON  M Take 1 tablet (20 mEq total) by mouth daily.   torsemide  20 MG tablet Commonly known as: DEMADEX  Take 1 tablet (20 mg total) by mouth daily.        Follow-up Information     [facility]. Go on 06/24/2024.   Specialty: Cardiology Why: Hospital Follow-Up 06/24/24 @ 8:15  [facility] Entrance C off of [address] Please bring all medications to follow-up appointment Free Valet Parking at the door or use Gate Code [phone]) to park under the building. Contact information: [address] [place]  [phone]               Allergies  Allergen Reactions   Ambien  [Zolpidem ] Other (See Comments)     Confusion, lightheadedness, dizzy, bad dreams   Hydrocodone Other (See Comments)    Patient states it gives her night mares    Consultations: Cardiology   Procedures/Studies: DG Chest Port 1 View Result Date: 06/18/2024 CLINICAL DATA:  Shortness of breath. EXAM: PORTABLE CHEST 1 VIEW COMPARISON:  June 16, 2024. FINDINGS: Stable cardiomediastinal silhouette. Mild central pulmonary vascular congestion and probable minimal bilateral pulmonary edema is noted. Small pleural effusions are noted. Bony thorax is unremarkable. IMPRESSION: Mild central pulmonary vascular congestion and probable minimal bilateral pulmonary edema. Small pleural effusions. Electronically Signed   By: Lynwood Landy Raddle M.D.   On: 06/18/2024 14:25   CT Angio Chest PE W/Cm &/Or Wo Cm Result Date: 06/16/2024 CLINICAL DATA:  Concern for pulmonary embolism. EXAM: CT ANGIOGRAPHY CHEST WITH CONTRAST TECHNIQUE: Multidetector CT imaging of the chest was performed using the standard protocol during bolus  administration of intravenous contrast. Multiplanar CT image reconstructions and MIPs were obtained to evaluate the vascular anatomy. RADIATION DOSE REDUCTION: This exam was performed according to the departmental dose-optimization program which includes automated exposure control, adjustment of the mA and/or kV according to patient size and/or use of iterative reconstruction technique. CONTRAST:  75mL OMNIPAQUE  IOHEXOL  350 MG/ML SOLN COMPARISON:  Chest radiograph dated 06/16/2024. FINDINGS: Cardiovascular: There is no cardiomegaly. Small pericardial effusion. There is coronary vascular calcification. Moderate atherosclerotic calcification of the thoracic aorta. No aneurysmal dilatation. No pulmonary artery embolus identified. Mediastinum/Nodes: No hilar adenopathy. The esophagus is grossly unremarkable. No mediastinal fluid collection. Lungs/Pleura: Moderate right and small left pleural effusions with partial compressive atelectasis of the lower lobes. Pneumonia is not excluded. There is diffuse interstitial and interlobular septal prominence consistent with edema. No pneumothorax. The central airways are patent. Upper Abdomen: No acute abnormality. Musculoskeletal: No acute osseous pathology. Review of the MIP images confirms the above findings. IMPRESSION: 1. No CT evidence of pulmonary artery embolus. 2. Interstitial edema with bilateral pleural effusions. 3.  Aortic Atherosclerosis (ICD10-I70.0). Electronically Signed   By: Vanetta Shelia HERO.D.  On: 06/16/2024 11:37   DG Chest Port 1 View Result Date: 06/16/2024 CLINICAL DATA:  Cough and shortness of breath EXAM: PORTABLE CHEST 1 VIEW COMPARISON:  Chest radiograph dated 03/20/2024 FINDINGS: Unchanged coarse rounded calcification corresponding to known right thyroid nodule. Normal lung volumes. Mild diffuse interstitial opacities. Blunting of bilateral costophrenic angles. Enlarged cardiomediastinal silhouette. No acute osseous abnormality. IMPRESSION: 1.  Mild diffuse interstitial opacities, which may represent pulmonary edema. 2. Blunting of bilateral costophrenic angles, which may represent small pleural effusions. 3. Cardiomegaly. Electronically Signed   By: Limin  Xu M.D.   On: 06/16/2024 10:26   CARDIAC CATHETERIZATION Result Date: 05/25/2024 Conclusions: Multivessel coronary artery disease, as detailed below, including sequential 50-60% proximal/mid LAD stenoses involving D2, sequential 50-70% OM2 lesions, and chronic total occlusion of mid RCA. Mildly elevated left ventricular filling pressure (LVEDP 21 mmHg). Recommendations: Add isosorbide  mononitrate for antianginal therapy.  Continue secondary prevention of CAD, including smoking cessation. Continue escalation of goal-directed medical therapy for mixed ischemic and nonischemic cardiomyopathy (LVEF reduction is out of proportion to the extent of CAD). Change furosemide  to 20 mg daily. Lonni Hanson, MD [facility]    Discharge Exam: Vitals:   06/20/24 0744 06/20/24 0749  BP:    Pulse:    Resp:    Temp:    SpO2: 100% 100%   Vitals:   06/20/24 0534 06/20/24 0740 06/20/24 0744 06/20/24 0749  BP: 132/73     Pulse: 80     Resp: 20     Temp: (!) 97.5 F (36.4 C)     TempSrc: Oral     SpO2: 99% 100% 100% 100%  Weight: 48.1 kg     Height:        General: Pt is alert, awake, not in acute distress Cardiovascular: RRR, S1/S2 +, no rubs, no gallops Respiratory: CTA bilaterally, no wheezing, no rhonchi Abdominal: Soft, NT, ND, bowel sounds + Extremities: no edema, no cyanosis    The results of significant diagnostics from this hospitalization (including imaging, microbiology, ancillary and laboratory) are listed below for reference.     Microbiology: No results found for this or any previous visit (from the past 240 hours).   Labs: BNP (last 3 results) Recent Labs    03/23/24 0442 03/30/24 0912 06/16/24 0922  BNP 972.0* 1,089.5* 2,829.0*   Basic Metabolic  Panel: Recent Labs  Lab 06/16/24 0922 06/16/24 1015 06/17/24 0431 06/18/24 0427 06/19/24 0500 06/20/24 0421  NA 133*  --  133* 132* 135 133*  K 3.3*  --  3.0* 4.6 5.6* 3.2*  CL 94*  --  92* 96* 99 96*  CO2 25  --  25 24 23 22   GLUCOSE 177*  --  287* 304* 161* 146*  BUN 14  --  19 24* 24* 25*  CREATININE 1.01*  --  1.20* 1.26* 1.34* 1.20*  CALCIUM  9.8  --  9.6 9.4 9.9 9.4  MG  --  2.1 2.0 2.1 2.4 2.2   Liver Function Tests: Recent Labs  Lab 06/16/24 0922  AST 31  ALT 25  ALKPHOS 57  BILITOT 1.6*  PROT 6.8  ALBUMIN 4.0   No results for input(s): LIPASE, AMYLASE in the last 168 hours. No results for input(s): AMMONIA in the last 168 hours. CBC: Recent Labs  Lab 06/16/24 0922 06/19/24 0500  WBC 8.3 9.7  NEUTROABS 5.4  --   HGB 12.9 13.6  HCT 41.0 43.4  MCV 82.0 84.1  PLT 185 200   Cardiac Enzymes: No  results for input(s): CKTOTAL, CKMB, CKMBINDEX, TROPONINI in the last 168 hours. BNP: Invalid input(s): POCBNP CBG: Recent Labs  Lab 06/19/24 0738 06/19/24 1123 06/19/24 1642 06/19/24 2141 06/20/24 0731  GLUCAP 160* 137* 264* 264* 167*   D-Dimer No results for input(s): DDIMER in the last 72 hours. Hgb A1c No results for input(s): HGBA1C in the last 72 hours. Lipid Profile No results for input(s): CHOL, HDL, LDLCALC, TRIG, CHOLHDL, LDLDIRECT in the last 72 hours. Thyroid function studies Recent Labs    06/18/24 0427  TSH 2.080   Anemia work up No results for input(s): VITAMINB12, FOLATE, FERRITIN, TIBC, IRON, RETICCTPCT in the last 72 hours. Urinalysis    Component Value Date/Time   COLORURINE YELLOW 06/16/2024 1003   APPEARANCEUR HAZY (A) 06/16/2024 1003   LABSPEC 1.015 06/16/2024 1003   PHURINE 6.0 06/16/2024 1003   GLUCOSEU >=500 (A) 06/16/2024 1003   HGBUR NEGATIVE 06/16/2024 1003   BILIRUBINUR NEGATIVE 06/16/2024 1003   KETONESUR NEGATIVE 06/16/2024 1003   PROTEINUR 30 (A) 06/16/2024 1003    NITRITE NEGATIVE 06/16/2024 1003   LEUKOCYTESUR NEGATIVE 06/16/2024 1003   Sepsis Labs Recent Labs  Lab 06/16/24 0922 06/19/24 0500  WBC 8.3 9.7   Microbiology No results found for this or any previous visit (from the past 240 hours).   Time coordinating discharge: 35 minutes  SIGNED:   Adron JONETTA Fairly, DO [facility] 06/20/2024, 10:01 AM  If 7PM-7AM, please contact night-coverage [email or web address]

## 2024-06-22 ENCOUNTER — Ambulatory Visit (HOSPITAL_COMMUNITY)
Admission: RE | Admit: 2024-06-22 | Discharge: 2024-06-22 | Disposition: A | Discharge status: Home / Self Care | Source: Ambulatory Visit | Attending: Medical | Admitting: Medical

## 2024-06-22 DIAGNOSIS — I5020 Unspecified systolic (congestive) heart failure: Secondary | ICD-10-CM | POA: Diagnosis present

## 2024-06-22 LAB — ECHOCARDIOGRAM COMPLETE
AR max vel: 2.47 cm2
AV Area VTI: 2.35 cm2
AV Area mean vel: 2.08 cm2
AV Mean grad: 2 mmHg
AV Peak grad: 3.5 mmHg
Ao pk vel: 0.94 m/s
Area-P 1/2: 4.31 cm2
S' Lateral: 4.3 cm

## 2024-06-22 NOTE — Progress Notes (Signed)
 HEART IMPACT TRANSITIONS OF CARE    PCP: Primary Cardiologist:  Chief Complaint:   HPI: Deborah Cooley is a 68 y.o. female with recently diagnosed systolic heart failure, hypertension, angioimmunoblastic lymphoma and tobacco abuse.  Recently admitted 5/25 with progressive DOE and chest discomfort.  BNP was elevated. HsTrop flat. CXR and CT chest with pulmonary vascular congestion. She was started on IV diuresis. Echo showed new drop in EF to 35-40% (previously 60-65% 19'). GDMT was started and OP cards f/u arranged.   Cardiac CT 6/25 with significant CAD.   LHC 7/25: multivessel CAD: sequential 50-60% proximal/mid LAD stenoses involving D2, sequential 50-70% OM2 lesions, and chronic total occlusion of mid RCA.   Admitted later 7/25 with A/C HFrEF. Diuresed and diuretics adjusted.   Today she returns for [facility] clinic follow up. Overall feeling ***. Denies palpitations, CP, dizziness, edema, or PND/Orthopnea. *** SOB. Appetite ok. No fever or chills. Weight at home *** pounds. Taking all medications. Denies ETOH, tobacco or drug use.   Echo studies reviewed:  Echo 8/25: EF 30-35%, LV with GHK, RV low normal, mild MR Echo 5/25: EF 35-40%, LV with GHK. RV normal, small pericardial effusion present, trivial MR. No TR.  Echo 6/19: EF 60-65%, no RWMA, small pericardial effusion seen   ROS: All systems negative except as listed in HPI, PMH and Problem List.  SH:  Social History   Socioeconomic History   Marital status: Married    Spouse name: Not on file   Number of children: 1   Years of education: Not on file   Highest education level: GED or equivalent  Occupational History   Occupation: Designer, industrial/product    Comment: works at [employer]  Tobacco Use   Smoking status: Every Day    Current packs/day: 1.50    Average packs/day: 1.5 packs/day for 46.0 years (69.0 ttl pk-yrs)    Types: Cigarettes    Passive exposure: Current   Smokeless tobacco: Never  Vaping  Use   Vaping status: Never Used  Substance and Sexual Activity   Alcohol use: Not Currently    Comment: 04/18/2018 quit years ago   Drug use: Not Currently    Comment: back in my 36s   Sexual activity: Not Currently  Other Topics Concern   Not on file  Social History Narrative   ** Merged History Encounter **       Patient has one son whom is healthy.   Has a live in boyfriend.   Social Drivers of Corporate investment banker Strain: Low Risk  (05/20/2024)   Received from [facility]   Overall Financial Resource Strain (CARDIA)    Difficulty of Paying Living Expenses: Not hard at all  Food Insecurity: No Food Insecurity (06/16/2024)   Hunger Vital Sign    Worried About Running Out of Food in the Last Year: Never true    Ran Out of Food in the Last Year: Never true  Transportation Needs: No Transportation Needs (06/16/2024)   PRAPARE - Administrator, Civil Service (Medical): No    Lack of Transportation (Non-Medical): No  Physical Activity: Not on file  Stress: Not on file  Social Connections: Moderately Isolated (06/16/2024)   Social Connection and Isolation Panel    Frequency of Communication with Friends and Family: More than three times a week    Frequency of Social Gatherings with Friends and Family: More than three times a week    Attends Religious  Services: Never    Active Member of Clubs or Organizations: No    Attends Banker Meetings: Never    Marital Status: Married  Catering manager Violence: Not At Risk (06/16/2024)   Humiliation, Afraid, Rape, and Kick questionnaire    Fear of Current or Ex-Partner: No    Emotionally Abused: No    Physically Abused: No    Sexually Abused: No    FH:  Family History  Problem Relation Age of Onset   Diabetes Mother        age 74   Heart attack Father        died age 1's   Hypertension Father    Cancer Sister        breast cancer diagonsed age 100 years   Thyroid disease Sister        one  sister with thyroid disease   Diabetes Other    Heart disease Other        Female <55   Arthritis Other    Asthma Other    Hyperlipidemia Other        several siblings    Past Medical History:  Diagnosis Date   angioimmunoblastic lymphoma dx'd 03/2018   BCC (basal cell carcinoma of skin) 05/02/2023   left nasal crease - schedule for Mohs   BCC (basal cell carcinoma), face 05/02/2023   mid tip of nose - schedule for Mohs   Bilateral pleural effusion 04/17/2018   Endometriosis    Essential hypertension, benign    Family history of adverse reaction to anesthesia    sister stops breathing I think (04/18/2018)   Heart murmur    Mixed hyperlipidemia    Pneumonia 04/17/2018    Current Outpatient Medications  Medication Sig Dispense Refill   albuterol  (VENTOLIN  HFA) 108 (90 Base) MCG/ACT inhaler Inhale 2 puffs into the lungs every 6 (six) hours as needed for wheezing or shortness of breath.     ALPRAZolam  (XANAX ) 0.5 MG tablet Take 0.5 mg by mouth 2 (two) times daily as needed for anxiety or sleep.     aspirin  EC 81 MG tablet Take 1 tablet (81 mg total) by mouth daily. Swallow whole. 30 tablet 12   atorvastatin  (LIPITOR) 80 MG tablet Take 1 tablet (80 mg total) by mouth daily. 90 tablet 3   losartan  (COZAAR ) 25 MG tablet Take 1 tablet (25 mg total) by mouth daily. 30 tablet 2   methocarbamol  (ROBAXIN ) 500 MG tablet Take 1 tablet (500 mg total) by mouth every 6 (six) hours as needed for muscle spasms. 20 tablet 0   metoprolol  succinate (TOPROL -XL) 25 MG 24 hr tablet Take 1 tablet (25 mg total) by mouth at bedtime. Take with or immediately following a meal. (Patient taking differently: Take 25 mg by mouth daily. Take with or immediately following a meal.) 90 tablet 1   nitroGLYCERIN  (NITROSTAT ) 0.4 MG SL tablet Place 1 tablet (0.4 mg total) under the tongue every 5 (five) minutes as needed for chest pain. If a single episode of chest pain is not relieved by one tablet, the patient will try  another within 5 minutes; and if this doesn't relieve the pain, the patient is instructed to call 911 for transportation to an emergency department. 25 tablet 3   potassium chloride  SA (KLOR-CON  M) 20 MEQ tablet Take 1 tablet (20 mEq total) by mouth daily. 30 tablet 1   torsemide  (DEMADEX ) 20 MG tablet Take 1 tablet (20 mg total) by mouth daily. 30 tablet  2   No current facility-administered medications for this visit.    There were no vitals filed for this visit.  PHYSICAL EXAM: General:  *** appearing.  No respiratory difficulty Neck: JVD *** cm.  Cor: Regular rate & rhythm. No murmurs. Lungs: clear Extremities: no edema  Neuro: alert & oriented x 3. Affect pleasant.    ECG:   ASSESSMENT & PLAN: Chronic systolic heart failure - Echo 4/74: EF 35-40%, LV with GHK. RV normal, small pericardial effusion present, trivial MR. No TR.  - Echo 6/19: EF 60-65%, no RWMA, small pericardial effusion seen - Echo 8/25: EF 30-35%, LV with GHK, RV low normal, mild MR - Cardiac CT 6/25 with significant CAD.  - Mixed ischemic/nonischemic CM NYHA II-IIIa, appears euvolemic GDMT  Diuretic- Continue Torsemide  20 mg daily *** BB- Continue toprol  XL 25 mg daily Ace/ARB/ARNI - Off Entresto  with syncope MRA Off spiro with syncope SGLT2i Farxiga  held with recent entresto  - Will need repeat echo in ~ 3 months   HTN - BP stable today   Tobacco abuse - stopped smoking since beign discharged. Congratulated.    4. CAD - As noted on coronary CT  - Continue ASA and statin - LHC 7/25: multivessel CAD: sequential 50-60% proximal/mid LAD stenoses involving D2, sequential 50-70% OM2 lesions, and chronic total occlusion of mid RCA.  - Denies CP     Follow up as scheduled with general cardiology.

## 2024-06-23 ENCOUNTER — Telehealth (HOSPITAL_COMMUNITY): Payer: Self-pay

## 2024-06-23 NOTE — Telephone Encounter (Signed)
 Called to confirm/remind patient of their appointment [facility] on 06/24/24 8:15.   Appointment:   [x] Confirmed  [] Left mess   [] No answer/No voice mail  [] VM Full/unable to leave message  [] Phone not in service  Patient reminded to bring all medications and/or complete list.  Confirmed patient has transportation. Gave directions, instructed to utilize valet parking.

## 2024-06-24 ENCOUNTER — Ambulatory Visit (HOSPITAL_COMMUNITY): Payer: Self-pay | Admitting: Internal Medicine

## 2024-06-24 ENCOUNTER — Ambulatory Visit (HOSPITAL_COMMUNITY)
Admission: RE | Admit: 2024-06-24 | Discharge: 2024-06-24 | Disposition: A | Discharge status: Home / Self Care | Source: Ambulatory Visit | Attending: Internal Medicine | Admitting: Internal Medicine

## 2024-06-24 ENCOUNTER — Encounter (HOSPITAL_COMMUNITY): Payer: Self-pay

## 2024-06-24 VITALS — BP 122/70 | HR 91 | Ht 62.0 in | Wt 115.6 lb

## 2024-06-24 DIAGNOSIS — I428 Other cardiomyopathies: Secondary | ICD-10-CM | POA: Diagnosis not present

## 2024-06-24 DIAGNOSIS — Z8572 Personal history of non-Hodgkin lymphomas: Secondary | ICD-10-CM | POA: Insufficient documentation

## 2024-06-24 DIAGNOSIS — Z79899 Other long term (current) drug therapy: Secondary | ICD-10-CM | POA: Diagnosis not present

## 2024-06-24 DIAGNOSIS — I255 Ischemic cardiomyopathy: Secondary | ICD-10-CM | POA: Insufficient documentation

## 2024-06-24 DIAGNOSIS — I110 Hypertensive heart disease with heart failure: Secondary | ICD-10-CM | POA: Diagnosis present

## 2024-06-24 DIAGNOSIS — R0989 Other specified symptoms and signs involving the circulatory and respiratory systems: Secondary | ICD-10-CM | POA: Diagnosis not present

## 2024-06-24 DIAGNOSIS — Z87891 Personal history of nicotine dependence: Secondary | ICD-10-CM | POA: Diagnosis not present

## 2024-06-24 DIAGNOSIS — Z8249 Family history of ischemic heart disease and other diseases of the circulatory system: Secondary | ICD-10-CM | POA: Insufficient documentation

## 2024-06-24 DIAGNOSIS — Z720 Tobacco use: Secondary | ICD-10-CM | POA: Diagnosis not present

## 2024-06-24 DIAGNOSIS — I5022 Chronic systolic (congestive) heart failure: Secondary | ICD-10-CM | POA: Insufficient documentation

## 2024-06-24 DIAGNOSIS — I3139 Other pericardial effusion (noninflammatory): Secondary | ICD-10-CM | POA: Diagnosis not present

## 2024-06-24 DIAGNOSIS — I2510 Atherosclerotic heart disease of native coronary artery without angina pectoris: Secondary | ICD-10-CM | POA: Diagnosis not present

## 2024-06-24 DIAGNOSIS — I10 Essential (primary) hypertension: Secondary | ICD-10-CM | POA: Diagnosis not present

## 2024-06-24 LAB — BASIC METABOLIC PANEL WITH GFR
Anion gap: 10 (ref 5–15)
BUN: 19 mg/dL (ref 8–23)
CO2: 26 mmol/L (ref 22–32)
Calcium: 10 mg/dL (ref 8.9–10.3)
Chloride: 95 mmol/L — ABNORMAL LOW (ref 98–111)
Creatinine, Ser: 1.2 mg/dL — ABNORMAL HIGH (ref 0.44–1.00)
GFR, Estimated: 49 mL/min — ABNORMAL LOW (ref >60)
Glucose, Bld: 179 mg/dL — ABNORMAL HIGH (ref 70–99)
Potassium: 4.5 mmol/L (ref 3.5–5.1)
Sodium: 131 mmol/L — ABNORMAL LOW (ref 135–145)

## 2024-06-24 LAB — BRAIN NATRIURETIC PEPTIDE: B Natriuretic Peptide: 1961.3 pg/mL — ABNORMAL HIGH (ref 0.0–100.0)

## 2024-06-24 MED ORDER — TORSEMIDE 20 MG PO TABS: 30.0000 mg | ORAL_TABLET | Freq: Every day | ORAL | 2 refills | Status: DC

## 2024-06-24 NOTE — Telephone Encounter (Signed)
Patient advised and verbalized understanding. Med list updated to reflect changes.  ? ?

## 2024-06-24 NOTE — Telephone Encounter (Signed)
-----   Message from Deborah Cooley sent at 06/24/2024 10:38 AM EDT ----- Fluid marker elevated. Increase Torsemide  to 30 mg daily (1.5 tablets daily) ----- Message ----- From: Interface, Lab In [place] Sent: 06/24/2024  10:10 AM EDT To: Deborah LITTIE Coe, NP

## 2024-06-24 NOTE — Patient Instructions (Signed)
 Medication Changes:  None, continue current medications  Lab Work:  Labs done today, we will call you for abnormal results   Special Instructions // Education:  Do the following things EVERYDAY: Weigh yourself in the morning before breakfast. Write it down and keep it in a log. Take your medicines as prescribed Eat low salt foods--Limit salt (sodium) to 2000 mg per day.  Stay as active as you can everyday Limit all fluids for the day to less than 2 liters   Follow-Up in: Thank you for allowing us  to provider your heart failure care after your recent hospitalization. Please follow-up with [facility] as scheduled 07/17/24

## 2024-07-17 ENCOUNTER — Encounter: Payer: Self-pay | Admitting: Internal Medicine

## 2024-07-17 ENCOUNTER — Ambulatory Visit: Attending: Internal Medicine | Admitting: Internal Medicine

## 2024-07-17 VITALS — BP 117/71 | HR 90 | Ht 62.0 in | Wt 114.8 lb

## 2024-07-17 DIAGNOSIS — E7849 Other hyperlipidemia: Secondary | ICD-10-CM | POA: Insufficient documentation

## 2024-07-17 DIAGNOSIS — Z720 Tobacco use: Secondary | ICD-10-CM | POA: Insufficient documentation

## 2024-07-17 DIAGNOSIS — I5022 Chronic systolic (congestive) heart failure: Secondary | ICD-10-CM | POA: Diagnosis present

## 2024-07-17 DIAGNOSIS — I2510 Atherosclerotic heart disease of native coronary artery without angina pectoris: Secondary | ICD-10-CM | POA: Insufficient documentation

## 2024-07-17 NOTE — Patient Instructions (Signed)
 Medication Instructions:  Your physician recommends that you continue on your current medications as directed. Please refer to the Current Medication list given to you today.   Labwork: None today  Testing/Procedures: None today  Follow-Up: December 2025  Any Other Special Instructions Will Be Listed Below (If Applicable).  If you need a refill on your cardiac medications before your next appointment, please call your pharmacy.

## 2024-07-17 NOTE — Progress Notes (Signed)
 Cardiology Office Note  Date: 07/17/2024   ID: Deborah Cooley, Deborah Cooley 14-Apr-1956, MRN [id number]  PCP:  Adine Duwaine MATSU, FNP  Cardiologist:  Diannah SHAUNNA Maywood, MD Electrophysiologist:  None   History of Present Illness: Deborah Cooley is a 68 y.o. female   Echocardiogram in May 2025 showed LVEF 35 to 40%, new onset cardiomyopathy and CVP was 3 mmHg.  No evidence of valvular heart disease was noted.  CT cardiac was initially performed for ischemia evaluation, coronary calcium  score was noted to be 477 (93rd percentile for age and sex matched control), total plaque volume is extensive and there was noted to be subtotal proximal to mid RCA, 50 to 69% stenosis of the proximal OM1 and mid LAD.  These lesions were noted to be flow-limiting, possibly.  LHC in July 2025 showed multivessel CAD, sequential 50 to 60% proximal/mid LAD stenosis involving D2, sequential 50 to 70% OM 2 lesions and CTO of mid RCA.  Mildly elevated LVEDP, 21 mmHg.  Medical management was recommended.  Entresto  and spironolactone  were discontinued due to syncope in June 2025.  Farxiga  was discontinued due to UTI.  She had recent hospitalization at [facility] in July 25 for ADHF.  Bilateral pleural effusions resolved eventually.  Patient is here for follow-up visit.  Patient is here, accompanied by daughter.  Collateral history is obtained from the daughter as well.  Patient does not have any angina or DOE.  Compensated on torsemide  30 mg once daily.  No dizziness, syncope, leg swelling and palpitations.  Current smoker.  She quit but again picked it up after an argument with her husband.   Past Medical History:  Diagnosis Date   angioimmunoblastic lymphoma dx'd 03/2018   BCC (basal cell carcinoma of skin) 05/02/2023   left nasal crease - schedule for Mohs   BCC (basal cell carcinoma), face 05/02/2023   mid tip of nose - schedule for Mohs   Bilateral pleural effusion 04/17/2018   Endometriosis    Essential hypertension,  benign    Family history of adverse reaction to anesthesia    sister stops breathing I think (04/18/2018)   Heart murmur    Mixed hyperlipidemia    Pneumonia 04/17/2018    Past Surgical History:  Procedure Laterality Date   ABDOMINAL HYSTERECTOMY  1970s/1980s   for endometriosis   Arthroscopic right shoulder surgery     AXILLARY LYMPH NODE BIOPSY Left 04/21/2018   Procedure: LEFT AXILLARY LYMPH NODE BIOPSY;  Surgeon: Stevie Herlene Righter, MD;  Location: [facility] OR;  Service: General;  Laterality: Left;   CARPAL TUNNEL RELEASE Right    GANGLION CYST EXCISION Right    IR IMAGING GUIDED PORT INSERTION  05/07/2018   IR REMOVAL TUN ACCESS W/ PORT W/O FL MOD SED  12/23/2018   LEFT HEART CATH AND CORONARY ANGIOGRAPHY N/A 05/25/2024   Procedure: LEFT HEART CATH AND CORONARY ANGIOGRAPHY;  Surgeon: Mady Bruckner, MD;  Location: [facility] INVASIVE CV LAB;  Service: Cardiovascular;  Laterality: N/A;   MULTIPLE TOOTH EXTRACTIONS     OOPHORECTOMY  1997   PARTIAL HYSTERECTOMY  1984   Right hand surgery     SHOULDER OPEN ROTATOR CUFF REPAIR Right    VESICOVAGINAL FISTULA CLOSURE W/ TAH      Current Outpatient Medications  Medication Sig Dispense Refill   albuterol  (VENTOLIN  HFA) 108 (90 Base) MCG/ACT inhaler Inhale 2 puffs into the lungs every 6 (six) hours as needed for wheezing or shortness of breath.     ALPRAZolam  (  XANAX ) 0.5 MG tablet Take 0.5 mg by mouth 2 (two) times daily as needed for anxiety or sleep.     aspirin  EC 81 MG tablet Take 1 tablet (81 mg total) by mouth daily. Swallow whole. 30 tablet 12   atorvastatin  (LIPITOR) 80 MG tablet Take 1 tablet (80 mg total) by mouth daily. 90 tablet 3   losartan  (COZAAR ) 25 MG tablet Take 1 tablet (25 mg total) by mouth daily. 30 tablet 2   methocarbamol  (ROBAXIN ) 500 MG tablet Take 1 tablet (500 mg total) by mouth every 6 (six) hours as needed for muscle spasms. 20 tablet 0   metoprolol  succinate (TOPROL -XL) 25 MG 24 hr tablet Take 1 tablet (25 mg total) by  mouth at bedtime. Take with or immediately following a meal. 90 tablet 1   nitroGLYCERIN  (NITROSTAT ) 0.4 MG SL tablet Place 1 tablet (0.4 mg total) under the tongue every 5 (five) minutes as needed for chest pain. If a single episode of chest pain is not relieved by one tablet, the patient will try another within 5 minutes; and if this doesn't relieve the pain, the patient is instructed to call 911 for transportation to an emergency department. 25 tablet 3   potassium chloride  SA (KLOR-CON  M) 20 MEQ tablet Take 1 tablet (20 mEq total) by mouth daily. 30 tablet 1   torsemide  (DEMADEX ) 20 MG tablet Take 1.5 tablets (30 mg total) by mouth daily. 45 tablet 2   No current facility-administered medications for this visit.   Allergies:  Ambien  [zolpidem ] and Hydrocodone   Social History: The patient  reports that she has been smoking cigarettes. She has a 69 pack-year smoking history. She has been exposed to tobacco smoke. She has never used smokeless tobacco. She reports that she does not currently use alcohol. She reports that she does not currently use drugs.   Family History: The patient's family history includes Arthritis in an other family member; Asthma in an other family member; Cancer in her sister; Diabetes in her mother and another family member; Heart attack in her father; Heart disease in an other family member; Hyperlipidemia in an other family member; Hypertension in her father; Thyroid disease in her sister.   ROS:  Please see the history of present illness. Otherwise, complete review of systems is positive for none  All other systems are reviewed and negative.   Physical Exam: VS:  BP 117/71 (BP Location: Right Arm, Cuff Size: Normal)   Pulse 90   Ht 5' 2 (1.575 m)   Wt 114 lb 12.8 oz (52.1 kg)   SpO2 98%   BMI 21.00 kg/m , BMI Body mass index is 21 kg/m.  Wt Readings from Last 3 Encounters:  07/17/24 114 lb 12.8 oz (52.1 kg)  06/24/24 115 lb 9.6 oz (52.4 kg)  06/20/24 106 lb  0.7 oz (48.1 kg)    General: Patient appears comfortable at rest. HEENT: Conjunctiva and lids normal, oropharynx clear with moist mucosa. Neck: Supple, no elevated JVP or carotid bruits, no thyromegaly. Lungs: Clear to auscultation, nonlabored breathing at rest. Cardiac: Regular rate and rhythm, no S3 or significant systolic murmur, no pericardial rub. Abdomen: Soft, nontender, no hepatomegaly, bowel sounds present, no guarding or rebound. Extremities: No pitting edema, distal pulses 2+. Skin: Warm and dry. Musculoskeletal: No kyphosis. Neuropsychiatric: Alert and oriented x3, affect grossly appropriate.  Recent Labwork: 06/16/2024: ALT 25; AST 31 06/18/2024: TSH 2.080 06/19/2024: Hemoglobin 13.6; Platelets 200 06/20/2024: Magnesium  2.2 06/24/2024: B Natriuretic Peptide 1,961.3; BUN  19; Creatinine, Ser 1.20; Potassium 4.5; Sodium 131     Component Value Date/Time   CHOL 102 05/21/2024 0925   TRIG 78 05/21/2024 0925   HDL 33 (L) 05/21/2024 0925   CHOLHDL 3.1 05/21/2024 0925   VLDL 16 05/21/2024 0925   LDLCALC 53 05/21/2024 0925     Assessment and Plan:  Multivessel CAD: No angina.  Echo showed LVEF 30 to 35% in 06/2024.  Due to positive CCTA, LHC was performed.  LHC in 05/2024 showed multivessel CAD (sequential 50 to 60% proximal/mid LAD stenosis involving D2, sequential 50 to 70% OM 2 lesions and CTO of mid RCA).  Continue aspirin  81 mg once daily, atorvastatin  80 mg nightly.  ER precautions for chest pain provided.  HLD, not at goal: LDL 141 in May 2025.  Repeat LDL 53 in July 25.  Continue atorvastatin  80 mg nightly.  Goal LDL less than 55.  Total plaque volume is extensive on CT cardiac.  Chronic systolic heart failure with LVEF 30 to 35%: Currently compensated.  Recent ADHF hospitalization in July 2025.  Continue p.o. torsemide  30 mg once daily, metoprolol  succinate 25 mg once daily, losartan  25 mg once daily.  Previously on Entresto  and spironolactone  but due to syncope it was held.  I  reviewed her kidney function test that showed fluctuating serum potassium levels.  Her serum K was 5.6 that improved to 4.5 on discharge from [facility].  She probably might benefit from addition of low-dose spironolactone , will add in the next clinic visit.  She was previously on Farxiga  that was discontinued due to new UTI after starting the medication.  She never had a UTI before adding Farxiga .  Nicotine  abuse: Current smoker, counseling provided.    30 minutes spent in reviewing the prior records, more than 3 labs, imaging/reports, discussion of CAD, chronic systolic heart failure, HLD and nicotine  abuse with the patient and documentation.     Medication Adjustments/Labs and Tests Ordered: Current medicines are reviewed at length with the patient today.  Concerns regarding medicines are outlined above.    Disposition:  Follow up 4 months  Signed Marieliz Strang Priya Jeniece Hannis, MD, 07/17/2024 8:46 AM    [facility] [address] [place], [place], [place] [address]

## 2024-08-11 ENCOUNTER — Telehealth: Payer: Self-pay | Admitting: Pharmacy Technician

## 2024-08-28 ENCOUNTER — Telehealth: Payer: Self-pay | Admitting: Pharmacy Technician

## 2024-08-28 NOTE — Telephone Encounter (Signed)
 Deborah Cooley

## 2024-10-27 ENCOUNTER — Ambulatory Visit: Admitting: Internal Medicine

## 2024-10-30 ENCOUNTER — Telehealth: Payer: Self-pay | Admitting: Pharmacy Technician

## 2024-10-30 NOTE — Telephone Encounter (Signed)
 Per chart review from 07/17/24- last ov with Dr. Stacia:  Chronic systolic heart failure with LVEF 30 to 35%: Currently compensated.  Recent ADHF hospitalization in July 2025.  Continue p.o. torsemide  30 mg once daily, metoprolol  succinate 25 mg once daily, losartan  25 mg once daily.  Previously on Entresto  and spironolactone  but due to syncope it was held.  I reviewed her kidney function test that showed fluctuating serum potassium levels.  Her serum K was 5.6 that improved to 4.5 on discharge from [facility].  She probably might benefit from addition of low-dose spironolactone , will add in the next clinic visit.  She was previously on Farxiga  that was discontinued due to new UTI after starting the medication.  She never had a UTI before adding Farxiga .    Pt no longer on Entresto .

## 2024-10-30 NOTE — Telephone Encounter (Signed)
 [facility] requests a new prescription  for farxiga 

## 2024-11-02 ENCOUNTER — Telehealth: Payer: Self-pay | Admitting: Pharmacy Technician

## 2024-11-02 NOTE — Telephone Encounter (Signed)
 Patient said she is taking the farxiga . [facility] is sending her the farxiga  in the mail now. Can someone please call her and talk to her about rather she should be on this farxiga  or not? Thank you!

## 2024-11-02 NOTE — Telephone Encounter (Signed)
 Patient said she is taking the farxiga . [facility] is sending her the farxiga  in the mail now. Can someone please call her and talk to her about rather she should be on this farxiga  or not? Thank you!    Sent note in other encounter  [phone]

## 2024-11-02 NOTE — Telephone Encounter (Signed)
 Called patient and confirmed medication list. Pt stated that she is NOT currently taking Farxiga  medication.

## 2024-11-08 ENCOUNTER — Encounter (HOSPITAL_COMMUNITY): Payer: Self-pay | Admitting: Emergency Medicine

## 2024-11-08 ENCOUNTER — Emergency Department (HOSPITAL_COMMUNITY)

## 2024-11-08 ENCOUNTER — Emergency Department (HOSPITAL_COMMUNITY)
Admission: EM | Admit: 2024-11-08 | Discharge: 2024-11-08 | Discharge status: Against Medical Advice | Source: Home / Self Care | Attending: Emergency Medicine | Admitting: Emergency Medicine

## 2024-11-08 ENCOUNTER — Other Ambulatory Visit: Payer: Self-pay

## 2024-11-08 DIAGNOSIS — R059 Cough, unspecified: Secondary | ICD-10-CM | POA: Insufficient documentation

## 2024-11-08 DIAGNOSIS — Z7982 Long term (current) use of aspirin: Secondary | ICD-10-CM | POA: Insufficient documentation

## 2024-11-08 DIAGNOSIS — Z79899 Other long term (current) drug therapy: Secondary | ICD-10-CM | POA: Insufficient documentation

## 2024-11-08 DIAGNOSIS — R0602 Shortness of breath: Secondary | ICD-10-CM | POA: Insufficient documentation

## 2024-11-08 DIAGNOSIS — I2510 Atherosclerotic heart disease of native coronary artery without angina pectoris: Secondary | ICD-10-CM | POA: Insufficient documentation

## 2024-11-08 DIAGNOSIS — N189 Chronic kidney disease, unspecified: Secondary | ICD-10-CM | POA: Insufficient documentation

## 2024-11-08 DIAGNOSIS — J449 Chronic obstructive pulmonary disease, unspecified: Secondary | ICD-10-CM | POA: Insufficient documentation

## 2024-11-08 DIAGNOSIS — I509 Heart failure, unspecified: Secondary | ICD-10-CM | POA: Insufficient documentation

## 2024-11-08 DIAGNOSIS — I130 Hypertensive heart and chronic kidney disease with heart failure and stage 1 through stage 4 chronic kidney disease, or unspecified chronic kidney disease: Secondary | ICD-10-CM | POA: Insufficient documentation

## 2024-11-08 DIAGNOSIS — Z5329 Procedure and treatment not carried out because of patient's decision for other reasons: Secondary | ICD-10-CM | POA: Insufficient documentation

## 2024-11-08 DIAGNOSIS — Z7951 Long term (current) use of inhaled steroids: Secondary | ICD-10-CM | POA: Insufficient documentation

## 2024-11-08 DIAGNOSIS — F17200 Nicotine dependence, unspecified, uncomplicated: Secondary | ICD-10-CM | POA: Insufficient documentation

## 2024-11-08 LAB — CBC
HCT: 35.4 % — ABNORMAL LOW (ref 36.0–46.0)
Hemoglobin: 11 g/dL — ABNORMAL LOW (ref 12.0–15.0)
MCH: 26.1 pg (ref 26.0–34.0)
MCHC: 31.1 g/dL (ref 30.0–36.0)
MCV: 83.9 fL (ref 80.0–100.0)
Platelets: 173 K/uL (ref 150–400)
RBC: 4.22 MIL/uL (ref 3.87–5.11)
RDW: 17.1 % — ABNORMAL HIGH (ref 11.5–15.5)
WBC: 8.9 K/uL (ref 4.0–10.5)
nRBC: 0 % (ref 0.0–0.2)

## 2024-11-08 LAB — COMPREHENSIVE METABOLIC PANEL WITH GFR
ALT: 57 U/L — ABNORMAL HIGH (ref 0–44)
AST: 56 U/L — ABNORMAL HIGH (ref 15–41)
Albumin: 4.1 g/dL (ref 3.5–5.0)
Alkaline Phosphatase: 73 U/L (ref 38–126)
Anion gap: 12 (ref 5–15)
BUN: 14 mg/dL (ref 8–23)
CO2: 20 mmol/L — ABNORMAL LOW (ref 22–32)
Calcium: 9.9 mg/dL (ref 8.9–10.3)
Chloride: 100 mmol/L (ref 98–111)
Creatinine, Ser: 0.93 mg/dL (ref 0.44–1.00)
GFR, Estimated: >60 mL/min
Glucose, Bld: 132 mg/dL — ABNORMAL HIGH (ref 70–99)
Potassium: 4.5 mmol/L (ref 3.5–5.1)
Sodium: 133 mmol/L — ABNORMAL LOW (ref 135–145)
Total Bilirubin: 0.9 mg/dL (ref 0.0–1.2)
Total Protein: 6.4 g/dL — ABNORMAL LOW (ref 6.5–8.1)

## 2024-11-08 LAB — TROPONIN T, HIGH SENSITIVITY: Troponin T High Sensitivity: 22 ng/L — ABNORMAL HIGH (ref 0–19)

## 2024-11-08 LAB — PRO BRAIN NATRIURETIC PEPTIDE: Pro Brain Natriuretic Peptide: 12793 pg/mL — ABNORMAL HIGH

## 2024-11-08 MED ORDER — IPRATROPIUM-ALBUTEROL 0.5-2.5 (3) MG/3ML IN SOLN
3.0000 mL | Freq: Once | RESPIRATORY_TRACT | Status: AC
  Administered 2024-11-08: 3 mL via RESPIRATORY_TRACT
  Filled 2024-11-08: qty 3

## 2024-11-08 NOTE — ED Notes (Signed)
 Pt refused Covid, flu swab

## 2024-11-08 NOTE — ED Triage Notes (Signed)
 Pt c/o SOB x several weeks, worse today, after she ran out of fluid pills (torsemide ) and her meds were changed (to furosemide ). Pt is tachycardic and tachypneic at time of triage. Denies CP. PMH COPD, coughs a lot, current smoker, says she has had fluid around her heart before and that this feels the same.

## 2024-11-08 NOTE — ED Provider Notes (Signed)
 " [facility] Provider Note   CSN: [id number] Arrival date & time: 11/08/24  1457     Patient presents with: Shortness of Breath   DESHAUNA CAYSON is a 68 y.o. female.   Patient with history of hypertension, CHF, COPD, CAD, CKD, tobacco use, hyperlipidemia presents today with complaints of shortness of breath.  She reports that last week she realized that she was almost out of her torsemide  that she takes daily, she reached out to her doctor for a refill and they prescribed her furosemide  instead of torsemide .  She started taking the furosemide , however has not had nearly as much output with this and is up 4 pounds.  States that since this switch she has become progressively more short of breath.  She reached out to her doctor to try to get the furosemide  filled, they apparently told her that they would fill it but it was not at her pharmacy when she called.  Reports that her symptoms feel like when she has been fluid overloaded in the past.  She does continue to smoke.  Denies any chest pain.  No leg pain or leg swelling.  No abdominal swelling.  Reports when she is fluid overloaded she does not usually swell in her legs or abdomen. Denies fevers or chills, does report cough but no worsening from her baseline cough attributed to her smoking.  The history is provided by the patient. No language interpreter was used.  Shortness of Breath Associated symptoms: cough        Prior to Admission medications  Medication Sig Start Date End Date Taking? Authorizing Provider  albuterol  (VENTOLIN  HFA) 108 (90 Base) MCG/ACT inhaler Inhale 2 puffs into the lungs every 6 (six) hours as needed for wheezing or shortness of breath. 06/10/24   [provider]  ALPRAZolam  (XANAX ) 0.5 MG tablet Take 0.5 mg by mouth 2 (two) times daily as needed for anxiety or sleep. 05/20/24   [provider]  aspirin  EC 81 MG tablet Take 1 tablet (81 mg total) by  mouth daily. Swallow whole. 05/06/24   Furth, Cadence H, PA-C  atorvastatin  (LIPITOR) 80 MG tablet Take 1 tablet (80 mg total) by mouth daily. 05/08/24 08/06/24  Meng, Hao, PA  losartan  (COZAAR ) 25 MG tablet Take 1 tablet (25 mg total) by mouth daily. 06/21/24   Maree, Pratik D, DO  methocarbamol  (ROBAXIN ) 500 MG tablet Take 1 tablet (500 mg total) by mouth every 6 (six) hours as needed for muscle spasms. 06/20/24   Maree, Pratik D, DO  metoprolol  succinate (TOPROL -XL) 25 MG 24 hr tablet Take 1 tablet (25 mg total) by mouth at bedtime. Take with or immediately following a meal. 05/08/24   Meng, Hao, PA  nitroGLYCERIN  (NITROSTAT ) 0.4 MG SL tablet Place 1 tablet (0.4 mg total) under the tongue every 5 (five) minutes as needed for chest pain. If a single episode of chest pain is not relieved by one tablet, the patient will try another within 5 minutes; and if this doesn't relieve the pain, the patient is instructed to call 911 for transportation to an emergency department. 05/06/24 08/04/24  Furth, Cadence H, PA-C  potassium chloride  SA (KLOR-CON  M) 20 MEQ tablet Take 1 tablet (20 mEq total) by mouth daily. 06/20/24   Maree, Pratik D, DO  torsemide  (DEMADEX ) 20 MG tablet Take 1.5 tablets (30 mg total) by mouth daily. 06/24/24   Hayes Beckey CROME, NP    Allergies: Ambien  [zolpidem ] and  Hydrocodone    Review of Systems  Respiratory:  Positive for cough and shortness of breath.   All other systems reviewed and are negative.   Updated Vital Signs BP (!) 137/58   Pulse (!) 107   Temp 97.6 F (36.4 C) (Oral)   Resp 20   Ht 5' 2 (1.575 m)   Wt 52.2 kg   SpO2 100%   BMI 21.03 kg/m   Physical Exam Vitals and nursing note reviewed.  Constitutional:      General: She is not in acute distress.    Appearance: Normal appearance. She is normal weight. She is not ill-appearing, toxic-appearing or diaphoretic.  HENT:     Head: Normocephalic and atraumatic.  Cardiovascular:     Rate and Rhythm: Normal rate and regular  rhythm.     Heart sounds: Normal heart sounds.  Pulmonary:     Effort: Pulmonary effort is normal. No respiratory distress.     Breath sounds: Rales present.  Abdominal:     Palpations: Abdomen is soft.     Tenderness: There is no abdominal tenderness.  Musculoskeletal:        General: Normal range of motion.     Cervical back: Normal range of motion.     Right lower leg: No tenderness. No edema.     Left lower leg: No tenderness. No edema.  Skin:    General: Skin is warm and dry.  Neurological:     General: No focal deficit present.     Mental Status: She is alert.  Psychiatric:        Mood and Affect: Mood normal.        Behavior: Behavior normal.     (all labs ordered are listed, but only abnormal results are displayed) Labs Reviewed  CBC - Abnormal; Notable for the following components:      Result Value   Hemoglobin 11.0 (*)    HCT 35.4 (*)    RDW 17.1 (*)    All other components within normal limits  COMPREHENSIVE METABOLIC PANEL WITH GFR - Abnormal; Notable for the following components:   Sodium 133 (*)    CO2 20 (*)    Glucose, Bld 132 (*)    Total Protein 6.4 (*)    AST 56 (*)    ALT 57 (*)    All other components within normal limits  TROPONIN T, HIGH SENSITIVITY - Abnormal; Notable for the following components:   Troponin T High Sensitivity 22 (*)    All other components within normal limits  RESP PANEL BY RT-PCR (RSV, FLU A&B, COVID)  [id number]  PRO BRAIN NATRIURETIC PEPTIDE    EKG: None  Radiology: DG Chest 2 View Result Date: 11/08/2024 CLINICAL DATA:  Shortness of breath. EXAM: DG CHEST 2V COMPARISON:  Radiograph 06/18/2024, CT 06/16/2024 FINDINGS: The heart is mildly enlarged. Stable mediastinal contours with aortic atherosclerosis. Mild to moderate pulmonary edema. Small bilateral pleural effusions. No confluent opacity. No pneumothorax. Calcified right thyroid nodule. No acute osseous findings. IMPRESSION: 1. Mild to moderate pulmonary edema with  small bilateral pleural effusions. 2. Mild cardiomegaly. Electronically Signed   By: Andrea Gasman M.D.   On: 11/08/2024 15:50     Procedures   Medications Ordered in the ED  ipratropium-albuterol  (DUONEB) 0.5-2.5 (3) MG/3ML nebulizer solution 3 mL (has no administration in time range)  Medical Decision Making Amount and/or Complexity of Data Reviewed Labs: ordered. Radiology: ordered.  Risk Prescription drug management.   This patient is a 68 y.o. female who presents to the ED for concern of shortness of breath, this involves an extensive number of treatment options, and is a complaint that carries with it a high risk of complications and morbidity. The emergent differential diagnosis prior to evaluation includes, but is not limited to,  CHF, pericardial effusion/tamponade, arrhythmias, ACS, COPD, asthma, bronchitis, pneumonia, pneumothorax, PE, anemia   This is not an exhaustive differential.   Past Medical History / Co-morbidities / Social History: history of hypertension, CHF, COPD, CAD, CKD, tobacco use, hyperlipidemia  Additional history: Chart reviewed. Pertinent results include: Admitted for CHF exacerbation 7/29 through 8/2.   Physical Exam: Physical exam performed. The pertinent findings include: Maintaining good oxygen saturation on room air, lung sounds with rales in bilateral lung bases.  No abdominal distention or leg swelling.  Lab Tests: I ordered, and personally interpreted labs.  The pertinent results include: No leukocytosis, hemoglobin 11  Labs which resulted after patient left AGAINST MEDICAL ADVICE:  First troponin 22.  NA 133, bicarb 20, glucose 132.  AST 56, ALT 57.  proBNP 12,, respiratory swab not collected.   Imaging Studies: I ordered imaging studies including CXR. I independently visualized and interpreted imaging which showed   1. Mild to moderate pulmonary edema with small bilateral pleural effusions. 2.  Mild cardiomegaly.  I agree with the radiologist interpretation.   Cardiac Monitoring:  The patient was maintained on a cardiac monitor.  My attending physician viewed and interpreted the cardiac monitored which showed an underlying rhythm of: sinus rhythm, no STEMI. I agree with this interpretation.   Medications: I ordered medication including duoneb  for shortness of breath.   Disposition:  Prior to patients work-up resulting, patient apparently informed nursing staff that she was leaving.  Reportedly was frustrated with wait times for interventions, was reportedly informed by staff that her labs had not resulted and therefore treatment had not been able to be initiated, specifically was unable to provide diuresis given that no potassium had resulted.  Apparently was informed that she would be leaving AGAINST MEDICAL ADVICE by nursing staff.  I was unable to talk to her myself her as she left prior to me being aware she intended to leave. She did sign AMA forms with staff and apparently walked out without assistance. I did try to call her several times with number provided in her chart without an answer  Final diagnoses:  Left against medical advice  Shortness of breath    ED Discharge Orders     None          Nora Lauraine DELENA DEVONNA 11/08/24 2128  "

## 2024-11-08 NOTE — ED Notes (Signed)
 Patient ambulated self to the restroom with me assisting. Patient stated she had no worsening  shortness of breath, CP or dizziness.

## 2024-11-08 NOTE — ED Notes (Signed)
 Patient and her husband just left. Patient left AMA. RN is aware

## 2024-11-10 ENCOUNTER — Encounter (HOSPITAL_COMMUNITY): Payer: Self-pay

## 2024-11-10 ENCOUNTER — Inpatient Hospital Stay (HOSPITAL_COMMUNITY)
Admission: EM | Admit: 2024-11-10 | Discharge: 2024-11-12 | Disposition: A | Discharge status: Home / Self Care | Attending: Internal Medicine | Admitting: Internal Medicine

## 2024-11-10 ENCOUNTER — Emergency Department (HOSPITAL_COMMUNITY)

## 2024-11-10 ENCOUNTER — Other Ambulatory Visit: Payer: Self-pay

## 2024-11-10 DIAGNOSIS — I5023 Acute on chronic systolic (congestive) heart failure: Secondary | ICD-10-CM | POA: Diagnosis present

## 2024-11-10 DIAGNOSIS — I10 Essential (primary) hypertension: Secondary | ICD-10-CM | POA: Diagnosis not present

## 2024-11-10 DIAGNOSIS — Z90711 Acquired absence of uterus with remaining cervical stump: Secondary | ICD-10-CM

## 2024-11-10 DIAGNOSIS — Z8249 Family history of ischemic heart disease and other diseases of the circulatory system: Secondary | ICD-10-CM

## 2024-11-10 DIAGNOSIS — I509 Heart failure, unspecified: Principal | ICD-10-CM

## 2024-11-10 DIAGNOSIS — Z8701 Personal history of pneumonia (recurrent): Secondary | ICD-10-CM

## 2024-11-10 DIAGNOSIS — N189 Chronic kidney disease, unspecified: Secondary | ICD-10-CM | POA: Diagnosis present

## 2024-11-10 DIAGNOSIS — Z8349 Family history of other endocrine, nutritional and metabolic diseases: Secondary | ICD-10-CM

## 2024-11-10 DIAGNOSIS — I130 Hypertensive heart and chronic kidney disease with heart failure and stage 1 through stage 4 chronic kidney disease, or unspecified chronic kidney disease: Principal | ICD-10-CM | POA: Diagnosis present

## 2024-11-10 DIAGNOSIS — Z85828 Personal history of other malignant neoplasm of skin: Secondary | ICD-10-CM

## 2024-11-10 DIAGNOSIS — Z885 Allergy status to narcotic agent status: Secondary | ICD-10-CM

## 2024-11-10 DIAGNOSIS — J449 Chronic obstructive pulmonary disease, unspecified: Secondary | ICD-10-CM | POA: Diagnosis present

## 2024-11-10 DIAGNOSIS — E782 Mixed hyperlipidemia: Secondary | ICD-10-CM | POA: Diagnosis present

## 2024-11-10 DIAGNOSIS — Z7982 Long term (current) use of aspirin: Secondary | ICD-10-CM

## 2024-11-10 DIAGNOSIS — Z825 Family history of asthma and other chronic lower respiratory diseases: Secondary | ICD-10-CM

## 2024-11-10 DIAGNOSIS — Z833 Family history of diabetes mellitus: Secondary | ICD-10-CM

## 2024-11-10 DIAGNOSIS — Z79899 Other long term (current) drug therapy: Secondary | ICD-10-CM

## 2024-11-10 DIAGNOSIS — Z803 Family history of malignant neoplasm of breast: Secondary | ICD-10-CM

## 2024-11-10 DIAGNOSIS — C8651 Angioimmunoblastic t-cell lymphoma, in remission: Secondary | ICD-10-CM | POA: Diagnosis present

## 2024-11-10 DIAGNOSIS — Z888 Allergy status to other drugs, medicaments and biological substances status: Secondary | ICD-10-CM

## 2024-11-10 DIAGNOSIS — I2510 Atherosclerotic heart disease of native coronary artery without angina pectoris: Secondary | ICD-10-CM | POA: Diagnosis present

## 2024-11-10 DIAGNOSIS — Z87891 Personal history of nicotine dependence: Secondary | ICD-10-CM

## 2024-11-10 DIAGNOSIS — Z87440 Personal history of urinary (tract) infections: Secondary | ICD-10-CM

## 2024-11-10 DIAGNOSIS — C8440 Peripheral T-cell lymphoma, not classified, unspecified site: Secondary | ICD-10-CM | POA: Diagnosis present

## 2024-11-10 LAB — CBC WITH DIFFERENTIAL/PLATELET
Abs Immature Granulocytes: 0.02 K/uL (ref 0.00–0.07)
Basophils Absolute: 0 K/uL (ref 0.0–0.1)
Basophils Relative: 0 %
Eosinophils Absolute: 0.1 K/uL (ref 0.0–0.5)
Eosinophils Relative: 1 %
HCT: 37.7 % (ref 36.0–46.0)
Hemoglobin: 11.8 g/dL — ABNORMAL LOW (ref 12.0–15.0)
Immature Granulocytes: 0 %
Lymphocytes Relative: 27 %
Lymphs Abs: 2.4 K/uL (ref 0.7–4.0)
MCH: 25.8 pg — ABNORMAL LOW (ref 26.0–34.0)
MCHC: 31.3 g/dL (ref 30.0–36.0)
MCV: 82.5 fL (ref 80.0–100.0)
Monocytes Absolute: 0.7 K/uL (ref 0.1–1.0)
Monocytes Relative: 8 %
Neutro Abs: 5.5 K/uL (ref 1.7–7.7)
Neutrophils Relative %: 64 %
Platelets: 200 K/uL (ref 150–400)
RBC: 4.57 MIL/uL (ref 3.87–5.11)
RDW: 17.4 % — ABNORMAL HIGH (ref 11.5–15.5)
WBC: 8.7 K/uL (ref 4.0–10.5)
nRBC: 0 % (ref 0.0–0.2)

## 2024-11-10 LAB — COMPREHENSIVE METABOLIC PANEL WITH GFR
ALT: 58 U/L — ABNORMAL HIGH (ref 0–44)
AST: 39 U/L (ref 15–41)
Albumin: 4.7 g/dL (ref 3.5–5.0)
Alkaline Phosphatase: 88 U/L (ref 38–126)
Anion gap: 17 — ABNORMAL HIGH (ref 5–15)
BUN: 16 mg/dL (ref 8–23)
CO2: 20 mmol/L — ABNORMAL LOW (ref 22–32)
Calcium: 10.2 mg/dL (ref 8.9–10.3)
Chloride: 98 mmol/L (ref 98–111)
Creatinine, Ser: 0.95 mg/dL (ref 0.44–1.00)
GFR, Estimated: >60 mL/min
Glucose, Bld: 162 mg/dL — ABNORMAL HIGH (ref 70–99)
Potassium: 4.6 mmol/L (ref 3.5–5.1)
Sodium: 134 mmol/L — ABNORMAL LOW (ref 135–145)
Total Bilirubin: 0.9 mg/dL (ref 0.0–1.2)
Total Protein: 7.3 g/dL (ref 6.5–8.1)

## 2024-11-10 LAB — TROPONIN T, HIGH SENSITIVITY: Troponin T High Sensitivity: 21 ng/L — ABNORMAL HIGH (ref 0–19)

## 2024-11-10 LAB — PRO BRAIN NATRIURETIC PEPTIDE: Pro Brain Natriuretic Peptide: 17315 pg/mL — ABNORMAL HIGH

## 2024-11-10 LAB — MAGNESIUM: Magnesium: 2.2 mg/dL (ref 1.7–2.4)

## 2024-11-10 MED ORDER — ACETAMINOPHEN 325 MG PO TABS: 650.0000 mg | ORAL_TABLET | Freq: Four times a day (QID) | ORAL | Status: DC | PRN

## 2024-11-10 MED ORDER — POTASSIUM CHLORIDE CRYS ER 20 MEQ PO TBCR
40.0000 meq | EXTENDED_RELEASE_TABLET | Freq: Once | ORAL | Status: AC
  Administered 2024-11-10: 40 meq via ORAL
  Filled 2024-11-10: qty 2

## 2024-11-10 MED ORDER — ONDANSETRON HCL 4 MG/2ML IJ SOLN: 4.0000 mg | Freq: Four times a day (QID) | INTRAMUSCULAR | Status: DC | PRN

## 2024-11-10 MED ORDER — FUROSEMIDE 10 MG/ML IJ SOLN
40.0000 mg | Freq: Two times a day (BID) | INTRAMUSCULAR | Status: DC
  Administered 2024-11-11 (×2): 40 mg via INTRAVENOUS
  Filled 2024-11-10 (×3): qty 4

## 2024-11-10 MED ORDER — ATORVASTATIN CALCIUM 40 MG PO TABS
80.0000 mg | ORAL_TABLET | Freq: Every day | ORAL | Status: DC
  Administered 2024-11-11 – 2024-11-12 (×2): 80 mg via ORAL
  Filled 2024-11-10 (×2): qty 2

## 2024-11-10 MED ORDER — IPRATROPIUM-ALBUTEROL 0.5-2.5 (3) MG/3ML IN SOLN
3.0000 mL | Freq: Four times a day (QID) | RESPIRATORY_TRACT | Status: DC | PRN
  Administered 2024-11-11 – 2024-11-12 (×5): 3 mL via RESPIRATORY_TRACT
  Filled 2024-11-10 (×5): qty 3

## 2024-11-10 MED ORDER — ACETAMINOPHEN 650 MG RE SUPP: 650.0000 mg | Freq: Four times a day (QID) | RECTAL | Status: DC | PRN

## 2024-11-10 MED ORDER — FUROSEMIDE 10 MG/ML IJ SOLN
40.0000 mg | Freq: Once | INTRAMUSCULAR | Status: AC
  Administered 2024-11-10: 40 mg via INTRAVENOUS
  Filled 2024-11-10: qty 4

## 2024-11-10 MED ORDER — ONDANSETRON HCL 4 MG PO TABS: 4.0000 mg | ORAL_TABLET | Freq: Four times a day (QID) | ORAL | Status: DC | PRN

## 2024-11-10 MED ORDER — ALBUTEROL SULFATE (2.5 MG/3ML) 0.083% IN NEBU
2.5000 mg | INHALATION_SOLUTION | Freq: Once | RESPIRATORY_TRACT | Status: AC
  Administered 2024-11-10: 2.5 mg via RESPIRATORY_TRACT
  Filled 2024-11-10: qty 3

## 2024-11-10 MED ORDER — POLYETHYLENE GLYCOL 3350 17 G PO PACK: 17.0000 g | PACK | Freq: Every day | ORAL | Status: DC | PRN

## 2024-11-10 MED ORDER — ASPIRIN 81 MG PO TBEC
81.0000 mg | DELAYED_RELEASE_TABLET | Freq: Every day | ORAL | Status: DC
  Administered 2024-11-11 – 2024-11-12 (×2): 81 mg via ORAL
  Filled 2024-11-10 (×2): qty 1

## 2024-11-10 MED ORDER — ENOXAPARIN SODIUM 40 MG/0.4ML IJ SOSY: 40.0000 mg | PREFILLED_SYRINGE | Freq: Every day | INTRAMUSCULAR | Status: DC

## 2024-11-10 NOTE — H&P (Signed)
 " History and Physical    Deborah Cooley FMW:[id number] DOB: 07-25-1956 DOA: 11/10/2024  PCP: Adine Duwaine MATSU, FNP   Patient coming from: Home  I have personally briefly reviewed patient's old medical records in [facility]  Chief Complaint: Difficulty Breathing  HPI: Deborah Cooley is a 68 y.o. female with medical history significant for systolic CHF, lymphoma, coronary artery disease, hypertension. Patient presented to the ED with complaints of progressive difficulty breathing, abdominal swelling with cough that started a few weeks ago.  She reports upper abdominal swelling, and difficulty breathing with minimal exertion.  She reports with her CHF she does not get swelling in her legs.  No chest pain.  Reports baseline weight of 111-115.  She requested a refill of her torsemide  12/15, Lasix  was called in.  Typically she takes 30 mg of torsemide  daily (1 and a half pill ), but she had to substitute this with Lasix .  She was unable to cut the pill in half, so she was taking 20 mg of Lasix  daily.  She reports reduced urine output with progressive swelling since then.  She was in the ED -2 days ago 12/21, but left AMA due to long wait time for intervention.  proBNP is elevated at 12,793.  ED Course: Temperature 97.5.  Heart rate 95-109.  Respiratory rate 12-26.  O2 sats is 97% on room air.  Blood pressure systolic 135-144. proBNP 17,215.   Troponin 22 >> 21. Chest x-ray with findings of CHF, small bilateral pleural effusions. IV Lasix  40 mg x 1 ordered.  Review of Systems: As per HPI all other systems reviewed and negative.  Past Medical History:  Diagnosis Date   angioimmunoblastic lymphoma dx'd 03/2018   BCC (basal cell carcinoma of skin) 05/02/2023   left nasal crease - schedule for Mohs   BCC (basal cell carcinoma), face 05/02/2023   mid tip of nose - schedule for Mohs   Bilateral pleural effusion 04/17/2018   Endometriosis    Essential hypertension, benign     Family history of adverse reaction to anesthesia    sister stops breathing I think (04/18/2018)   Heart murmur    Mixed hyperlipidemia    Pneumonia 04/17/2018    Past Surgical History:  Procedure Laterality Date   ABDOMINAL HYSTERECTOMY  1970s/1980s   for endometriosis   Arthroscopic right shoulder surgery     AXILLARY LYMPH NODE BIOPSY Left 04/21/2018   Procedure: LEFT AXILLARY LYMPH NODE BIOPSY;  Surgeon: Stevie Herlene Righter, MD;  Location: [facility] OR;  Service: General;  Laterality: Left;   CARPAL TUNNEL RELEASE Right    GANGLION CYST EXCISION Right    IR IMAGING GUIDED PORT INSERTION  05/07/2018   IR REMOVAL TUN ACCESS W/ PORT W/O FL MOD SED  12/23/2018   LEFT HEART CATH AND CORONARY ANGIOGRAPHY N/A 05/25/2024   Procedure: LEFT HEART CATH AND CORONARY ANGIOGRAPHY;  Surgeon: Mady Bruckner, MD;  Location: [facility] INVASIVE CV LAB;  Service: Cardiovascular;  Laterality: N/A;   MULTIPLE TOOTH EXTRACTIONS     OOPHORECTOMY  1997   PARTIAL HYSTERECTOMY  1984   Right hand surgery     SHOULDER OPEN ROTATOR CUFF REPAIR Right    VESICOVAGINAL FISTULA CLOSURE W/ TAH       reports that she has been smoking cigarettes. She has a 69 pack-year smoking history. She has been exposed to tobacco smoke. She has never used smokeless tobacco. She reports that she does not currently use alcohol. She reports that she does  not currently use drugs.  Allergies[1]  Family History  Problem Relation Age of Onset   Diabetes Mother        age 3   Heart attack Father        died age 71's   Hypertension Father    Cancer Sister        breast cancer diagonsed age 12 years   Thyroid disease Sister        one sister with thyroid disease   Diabetes Other    Heart disease Other        Female <55   Arthritis Other    Asthma Other    Hyperlipidemia Other        several siblings    Prior to Admission medications  Medication Sig Start Date End Date Taking? Authorizing Provider  albuterol  (VENTOLIN  HFA) 108 (90 Base)  MCG/ACT inhaler Inhale 2 puffs into the lungs every 6 (six) hours as needed for wheezing or shortness of breath. 06/10/24   [provider]  ALPRAZolam  (XANAX ) 0.5 MG tablet Take 0.5 mg by mouth 2 (two) times daily as needed for anxiety or sleep. 05/20/24   [provider]  aspirin  EC 81 MG tablet Take 1 tablet (81 mg total) by mouth daily. Swallow whole. 05/06/24   Furth, Cadence H, PA-C  atorvastatin  (LIPITOR) 80 MG tablet Take 1 tablet (80 mg total) by mouth daily. 05/08/24 08/06/24  Meng, Hao, PA  losartan  (COZAAR ) 25 MG tablet Take 1 tablet (25 mg total) by mouth daily. 06/21/24   Maree, Pratik D, DO  methocarbamol  (ROBAXIN ) 500 MG tablet Take 1 tablet (500 mg total) by mouth every 6 (six) hours as needed for muscle spasms. 06/20/24   Maree, Pratik D, DO  metoprolol  succinate (TOPROL -XL) 25 MG 24 hr tablet Take 1 tablet (25 mg total) by mouth at bedtime. Take with or immediately following a meal. 05/08/24   Meng, Hao, PA  nitroGLYCERIN  (NITROSTAT ) 0.4 MG SL tablet Place 1 tablet (0.4 mg total) under the tongue every 5 (five) minutes as needed for chest pain. If a single episode of chest pain is not relieved by one tablet, the patient will try another within 5 minutes; and if this doesn't relieve the pain, the patient is instructed to call 911 for transportation to an emergency department. 05/06/24 08/04/24  Furth, Cadence H, PA-C  potassium chloride  SA (KLOR-CON  M) 20 MEQ tablet Take 1 tablet (20 mEq total) by mouth daily. 06/20/24   Maree, Pratik D, DO  torsemide  (DEMADEX ) 20 MG tablet Take 1.5 tablets (30 mg total) by mouth daily. 06/24/24   Hayes Beckey CROME, NP    Physical Exam: Vitals:   11/10/24 1500 11/10/24 1510 11/10/24 1530 11/10/24 1630  BP: 135/84  (!) 144/82 (!) 143/79  Pulse: 100  100 97  Resp: (!) 26  18 19   Temp:      TempSrc:      SpO2: 95% 97% 98% 96%  Weight:      Height:        Constitutional: NAD, calm, comfortable Vitals:   11/10/24 1500 11/10/24 1510 11/10/24 1530  11/10/24 1630  BP: 135/84  (!) 144/82 (!) 143/79  Pulse: 100  100 97  Resp: (!) 26  18 19   Temp:      TempSrc:      SpO2: 95% 97% 98% 96%  Weight:      Height:       Eyes: PERRL, lids and conjunctivae normal ENMT: Mucous membranes are  moist.   Neck: normal, supple, no masses, no thyromegaly Respiratory: clear to auscultation bilaterally, no wheezing, no crackles. Normal respiratory effort. No accessory muscle use.  Cardiovascular: Tachycardic, regular rate and rhythm, no murmurs / rubs / gallops. No extremity edema. Abdomen: Soft, not appreciably distended no tenderness, no masses palpated. No hepatosplenomegaly.   Musculoskeletal: no clubbing / cyanosis. No joint deformity upper and lower extremities.  Skin: no rashes, lesions, ulcers. No induration Neurologic: No facial asymmetry, moves extremities spontaneously, speech fluent. Psychiatric: Normal judgment and insight. Alert and oriented x 3. Normal mood.   Labs on Admission: I have personally reviewed following labs and imaging studies  CBC: Recent Labs  Lab 11/08/24 1639 11/10/24 1517  WBC 8.9 8.7  NEUTROABS  --  5.5  HGB 11.0* 11.8*  HCT 35.4* 37.7  MCV 83.9 82.5  PLT 173 200   Basic Metabolic Panel: Recent Labs  Lab 11/08/24 1639 11/10/24 1517  NA 133* 134*  K 4.5 4.6  CL 100 98  CO2 20* 20*  GLUCOSE 132* 162*  BUN 14 16  CREATININE 0.93 0.95  CALCIUM  9.9 10.2  MG  --  2.2   GFR: Estimated Creatinine Clearance: 44.2 mL/min (by C-G formula based on SCr of 0.95 mg/dL). Liver Function Tests: Recent Labs  Lab 11/08/24 1639 11/10/24 1517  AST 56* 39  ALT 57* 58*  ALKPHOS 73 88  BILITOT 0.9 0.9  PROT 6.4* 7.3  ALBUMIN 4.1 4.7   BNP (last 3 results) Recent Labs    11/08/24 1639 11/10/24 1517  PROBNP 12,793.0* 17,315.0*    Radiological Exams on Admission: DG Chest Port 1 View Result Date: 11/10/2024 CLINICAL DATA:  Shortness of breath. EXAM: PORTABLE CHEST 1 VIEW COMPARISON:  Chest radiograph  dated 11/08/2024. FINDINGS: Cardiomegaly with vascular congestion and edema. Small bilateral pleural effusions and bibasilar atelectasis or infiltrate slightly progressed since the prior radiograph. No pneumothorax. Atherosclerotic calcification of the aorta. Right thyroid rim calcified nodule. No acute osseous pathology. IMPRESSION: Cardiomegaly with findings of CHF and small bilateral pleural effusions. Electronically Signed   By: Vanetta Chou M.D.   On: 11/10/2024 16:16    EKG: Independently reviewed.  Sinus tachycardia rate 105, QTc 467.  No significant change from prior.  Assessment/Plan Principal Problem:   Acute on chronic HFrEF (heart failure with reduced ejection fraction) (HCC) Active Problems:   Essential hypertension, benign   Angioimmunoblastic lymphoma (HCC)   Coronary artery disease  Assessment and Plan:  Acute on chronic systolic CHF-presented with dyspnea on exertion, abdominal distention.  Baseline weight of 111-115, she has not checked her weight recently.  proBNP elevated at 17,315.  Troponin 22 > 21.  Chest x-ray with history of small bilateral pleural effusions.  Last echo 06/2024 EF of 30 to 35%.  Was in the ED with same complaints 12/21, left AMA.  Supposed to be on torsemide  30 mg daily, but Lasix  20 mg daily was called in when she requested refill of torsemide . - IV Lasix  40 twice daily - Input output, daily weights, daily BMP - Entresto  and spironolactone  were discontinued due to syncope in June 2025. Farxiga  was discontinued due to UTI   Hypertension-stable. - Resume losartan , metoprolol   Coronary artery disease-no chest pain.  EKG unchanged.  Troponin 22>> 21.LHC in July 2025 showed multivessel CAD, sequential 50 to 60% proximal/mid LAD stenosis involving D2, sequential 50 to 70% OM 2 lesions and CTO of mid RCA. Mildly elevated LVEDP, 21 mmHg. Medical management was recommended. -  - Resume  aspirin , Lipitor, losartan , metoprolol   Lymphoma-follows with Dr.  Onesimo, completed therapy 6 years ago.  Last visit 01/2024.  Currently under surveillance, yearly.  COPD-stable. - Albuterol  nebs as needed   DVT prophylaxis: Lovenox  Code Status: Full code, confirmed with patient and daughter Josette at bedside. Family Communication: Daughter  -Kristen at bedside Disposition Plan:  ~ 2 days Consults called: None  Admission status:  Obs tele    Author: Tully FORBES Carwin, MD 11/10/2024 5:26 PM  For on call review [email or web address].     [1]  Allergies Allergen Reactions   Ambien  [Zolpidem ] Other (See Comments)     Confusion, lightheadedness, dizzy, bad dreams   Hydrocodone Other (See Comments)    Patient states it gives her night mares   "

## 2024-11-10 NOTE — Plan of Care (Signed)
  Problem: Education: Goal: Knowledge of General Education information will improve Description: Including pain rating scale, medication(s)/side effects and non-pharmacologic comfort measures Outcome: Progressing   Problem: Health Behavior/Discharge Planning: Goal: Ability to manage health-related needs will improve Outcome: Progressing   Problem: Clinical Measurements: Goal: Will remain free from infection Outcome: Progressing Goal: Diagnostic test results will improve Outcome: Progressing Goal: Respiratory complications will improve Outcome: Progressing   Problem: Activity: Goal: Risk for activity intolerance will decrease Outcome: Progressing   Problem: Nutrition: Goal: Adequate nutrition will be maintained Outcome: Progressing

## 2024-11-10 NOTE — ED Provider Notes (Signed)
 " [place] EMERGENCY DEPARTMENT AT [facility] Provider Note   CSN: [id number] Arrival date & time: 11/10/24  1402     Patient presents with: Shortness of Breath   Deborah Cooley is a 68 y.o. female who presents emergency department chief complaint of shortness of breath.  Patient reports that she has progressively worsening shortness breath since the 15th when her diuretic was changed and she got a new medication.  She complains of some swelling in her abdomen which is new, coughing, exertional dyspnea PND and orthopnea.  She has not been putting out as much urine is normal.  She denies fever or chills or productive cough.    Shortness of Breath      Prior to Admission medications  Medication Sig Start Date End Date Taking? Authorizing Provider  albuterol  (VENTOLIN  HFA) 108 (90 Base) MCG/ACT inhaler Inhale 2 puffs into the lungs every 6 (six) hours as needed for wheezing or shortness of breath. 06/10/24   [provider]  ALPRAZolam  (XANAX ) 0.5 MG tablet Take 0.5 mg by mouth 2 (two) times daily as needed for anxiety or sleep. 05/20/24   [provider]  aspirin  EC 81 MG tablet Take 1 tablet (81 mg total) by mouth daily. Swallow whole. 05/06/24   Furth, Cadence H, PA-C  atorvastatin  (LIPITOR) 80 MG tablet Take 1 tablet (80 mg total) by mouth daily. 05/08/24 08/06/24  Meng, Hao, PA  losartan  (COZAAR ) 25 MG tablet Take 1 tablet (25 mg total) by mouth daily. 06/21/24   Maree, Pratik D, DO  methocarbamol  (ROBAXIN ) 500 MG tablet Take 1 tablet (500 mg total) by mouth every 6 (six) hours as needed for muscle spasms. 06/20/24   Maree, Pratik D, DO  metoprolol  succinate (TOPROL -XL) 25 MG 24 hr tablet Take 1 tablet (25 mg total) by mouth at bedtime. Take with or immediately following a meal. 05/08/24   Meng, Hao, PA  nitroGLYCERIN  (NITROSTAT ) 0.4 MG SL tablet Place 1 tablet (0.4 mg total) under the tongue every 5 (five) minutes as needed for chest pain. If a single episode of  chest pain is not relieved by one tablet, the patient will try another within 5 minutes; and if this doesn't relieve the pain, the patient is instructed to call 911 for transportation to an emergency department. 05/06/24 08/04/24  Furth, Cadence H, PA-C  potassium chloride  SA (KLOR-CON  M) 20 MEQ tablet Take 1 tablet (20 mEq total) by mouth daily. 06/20/24   Maree, Pratik D, DO  torsemide  (DEMADEX ) 20 MG tablet Take 1.5 tablets (30 mg total) by mouth daily. 06/24/24   Hayes Beckey CROME, NP    Allergies: Ambien  [zolpidem ] and Hydrocodone    Review of Systems  Respiratory:  Positive for shortness of breath.     Updated Vital Signs BP 138/80 (BP Location: Right Arm)   Pulse (!) 109   Temp (!) 97.5 F (36.4 C) (Oral)   Resp 18   Ht 5' 2 (1.575 m)   Wt 52.2 kg   BMI 21.03 kg/m   Physical Exam Vitals and nursing note reviewed.  Constitutional:      General: She is not in acute distress.    Appearance: She is well-developed. She is not diaphoretic.  HENT:     Head: Normocephalic and atraumatic.     Right Ear: External ear normal.     Left Ear: External ear normal.     Nose: Nose normal.     Mouth/Throat:     Mouth: Mucous  membranes are moist.  Eyes:     General: No scleral icterus.    Conjunctiva/sclera: Conjunctivae normal.  Cardiovascular:     Rate and Rhythm: Normal rate and regular rhythm.     Heart sounds: Normal heart sounds. No murmur heard.    No friction rub. No gallop.  Pulmonary:     Effort: Pulmonary effort is normal. Tachypnea present. No respiratory distress.     Breath sounds: Examination of the right-lower field reveals rales. Examination of the left-lower field reveals rales. Rales present.  Abdominal:     General: Bowel sounds are normal. There is no distension.     Palpations: Abdomen is soft. There is no mass.     Tenderness: There is no abdominal tenderness. There is no guarding.  Musculoskeletal:     Cervical back: Normal range of motion.     Right lower leg: No  edema.     Left lower leg: No edema.  Skin:    General: Skin is warm and dry.  Neurological:     Mental Status: She is alert and oriented to person, place, and time.  Psychiatric:        Behavior: Behavior normal.     (all labs ordered are listed, but only abnormal results are displayed) Labs Reviewed  CBC WITH DIFFERENTIAL/PLATELET - Abnormal; Notable for the following components:      Result Value   Hemoglobin 11.8 (*)    MCH 25.8 (*)    RDW 17.4 (*)    All other components within normal limits  COMPREHENSIVE METABOLIC PANEL WITH GFR - Abnormal; Notable for the following components:   Sodium 134 (*)    CO2 20 (*)    Glucose, Bld 162 (*)    ALT 58 (*)    Anion gap 17 (*)    All other components within normal limits  PRO BRAIN NATRIURETIC PEPTIDE - Abnormal; Notable for the following components:   Pro Brain Natriuretic Peptide 17,315.0 (*)    All other components within normal limits  TROPONIN T, HIGH SENSITIVITY - Abnormal; Notable for the following components:   Troponin T High Sensitivity 21 (*)    All other components within normal limits  MAGNESIUM   BASIC METABOLIC PANEL WITH GFR    EKG: None  Radiology: [facility] Chest Port 1 View Result Date: 11/10/2024 CLINICAL DATA:  Shortness of breath. EXAM: PORTABLE CHEST 1 VIEW COMPARISON:  Chest radiograph dated 11/08/2024. FINDINGS: Cardiomegaly with vascular congestion and edema. Small bilateral pleural effusions and bibasilar atelectasis or infiltrate slightly progressed since the prior radiograph. No pneumothorax. Atherosclerotic calcification of the aorta. Right thyroid rim calcified nodule. No acute osseous pathology. IMPRESSION: Cardiomegaly with findings of CHF and small bilateral pleural effusions. Electronically Signed   By: Vanetta Chou M.D.   On: 11/10/2024 16:16     Procedures   Medications Ordered in the ED  potassium chloride  SA (KLOR-CON  M) CR tablet 40 mEq (has no administration in time range)  furosemide   (LASIX ) injection 40 mg (has no administration in time range)    Clinical Course as of 11/10/24 1635  Tue Nov 10, 2024  1620 Pro Brain natriuretic peptide(!) [AH]    Clinical Course User Index [AH] Arloa Chroman, PA-C                                 Medical Decision Making Amount and/or Complexity of Data Reviewed Labs: ordered. Decision-making details documented in ED  Course. Radiology: ordered.  Risk Prescription drug management. Decision regarding hospitalization.   This patient presents to the ED for concern of shortness of breath, this involves an extensive number of treatment options, and is a complaint that carries with it a high risk of complications and morbidity.  The emergent differential diagnosis for shortness of breath includes, but is not limited to, Pulmonary edema, bronchoconstriction, Pneumonia, Pulmonary embolism, Pneumotherax/ Hemothorax, Dysrythmia, ACS.    Co morbidities:   has a past medical history of angioimmunoblastic lymphoma (dx'd 03/2018), BCC (basal cell carcinoma of skin) (05/02/2023), BCC (basal cell carcinoma), face (05/02/2023), Bilateral pleural effusion (04/17/2018), Endometriosis, Essential hypertension, benign, Family history of adverse reaction to anesthesia, Heart murmur, Mixed hyperlipidemia, and Pneumonia (04/17/2018).   Social Determinants of Health:   SDOH Screenings   Food Insecurity: No Food Insecurity (11/10/2024)  Housing: Low Risk (11/10/2024)  Transportation Needs: No Transportation Needs (11/10/2024)  Utilities: Not At Risk (11/10/2024)  Financial Resource Strain: Low Risk (10/12/2024)   Received from [facility]  Physical Activity: Insufficiently Active (10/12/2024)   Received [facility]  Social Connections: Moderately Integrated (11/10/2024)  Stress: Stress Concern Present (10/12/2024)   Received [facility]  Tobacco Use: High Risk (11/10/2024)     Additional history:  {Additional history  obtained from daughter at bedside   Lab Tests:  I Ordered, and personally interpreted labs.  The pertinent results include:   Labs reviewed.  CBC with mild baseline anemia, CMP shows glucose 162 no significant abnormalities.  Troponin 21 likely due to demand, proBNP 17,000.  Magnesium  within normal limits  Imaging Studies:  I ordered imaging studies including chest x-ray I independently visualized and interpreted imaging which showed cardiomegaly with bilateral pleural effusions consistent with CHF exacerbation I agree with the radiologist interpretation  Cardiac Monitoring/ECG:  The patient was maintained on a cardiac monitor.  I personally viewed and interpreted the cardiac monitored which showed an underlying rhythm of: Sinus tachycardia  Medicines ordered and prescription drug management:  I ordered medication including  Medications  potassium chloride  SA (KLOR-CON  M) CR tablet 40 mEq (has no administration in time range)  furosemide  (LASIX ) injection 40 mg (has no administration in time range)   for CHF exacerbation Reevaluation of the patient after these medicines showed that the patient   Test Considered:    Critical Interventions:    Consultations Obtained: Dr. Pearlean for admission  Problem List / ED Course:     ICD-10-CM   1. Acute on chronic congestive heart failure, unspecified heart failure type (HCC)  I50.9       MDM: 67 year old female presents emergency department with chief complaint of shortness of breath.  She appears to have CHF exacerbation.  Have started her on IV Lasix , strict ins and outs.  There is no evidence of renal failure.  Patient will do well to come in for diuresis.   Dispostion:  After consideration of the diagnostic results and the patients response to treatment, I feel that the patent would benefit from admission.      Final diagnoses:  Acute on chronic congestive heart failure, unspecified heart failure type [facility])    ED  Discharge Orders     None          Arloa Chroman, PA-C 11/10/24 1919    Elnor Jayson LABOR, DO 11/16/24 1545  "

## 2024-11-10 NOTE — ED Triage Notes (Signed)
 Pt arrived via POV c/o recurrent SOB and fluid retention for several weeks.

## 2024-11-11 DIAGNOSIS — Z8701 Personal history of pneumonia (recurrent): Secondary | ICD-10-CM | POA: Diagnosis not present

## 2024-11-11 DIAGNOSIS — Z7982 Long term (current) use of aspirin: Secondary | ICD-10-CM | POA: Diagnosis not present

## 2024-11-11 DIAGNOSIS — Z87440 Personal history of urinary (tract) infections: Secondary | ICD-10-CM | POA: Diagnosis not present

## 2024-11-11 DIAGNOSIS — Z85828 Personal history of other malignant neoplasm of skin: Secondary | ICD-10-CM | POA: Diagnosis not present

## 2024-11-11 DIAGNOSIS — Z803 Family history of malignant neoplasm of breast: Secondary | ICD-10-CM | POA: Diagnosis not present

## 2024-11-11 DIAGNOSIS — I5023 Acute on chronic systolic (congestive) heart failure: Secondary | ICD-10-CM

## 2024-11-11 DIAGNOSIS — I130 Hypertensive heart and chronic kidney disease with heart failure and stage 1 through stage 4 chronic kidney disease, or unspecified chronic kidney disease: Secondary | ICD-10-CM | POA: Diagnosis present

## 2024-11-11 DIAGNOSIS — Z833 Family history of diabetes mellitus: Secondary | ICD-10-CM | POA: Diagnosis not present

## 2024-11-11 DIAGNOSIS — E782 Mixed hyperlipidemia: Secondary | ICD-10-CM | POA: Diagnosis present

## 2024-11-11 DIAGNOSIS — Z885 Allergy status to narcotic agent status: Secondary | ICD-10-CM | POA: Diagnosis not present

## 2024-11-11 DIAGNOSIS — Z888 Allergy status to other drugs, medicaments and biological substances status: Secondary | ICD-10-CM | POA: Diagnosis not present

## 2024-11-11 DIAGNOSIS — Z8349 Family history of other endocrine, nutritional and metabolic diseases: Secondary | ICD-10-CM | POA: Diagnosis not present

## 2024-11-11 DIAGNOSIS — Z87891 Personal history of nicotine dependence: Secondary | ICD-10-CM | POA: Diagnosis not present

## 2024-11-11 DIAGNOSIS — Z8249 Family history of ischemic heart disease and other diseases of the circulatory system: Secondary | ICD-10-CM | POA: Diagnosis not present

## 2024-11-11 DIAGNOSIS — N189 Chronic kidney disease, unspecified: Secondary | ICD-10-CM | POA: Diagnosis present

## 2024-11-11 DIAGNOSIS — J449 Chronic obstructive pulmonary disease, unspecified: Secondary | ICD-10-CM | POA: Diagnosis present

## 2024-11-11 DIAGNOSIS — Z79899 Other long term (current) drug therapy: Secondary | ICD-10-CM | POA: Diagnosis not present

## 2024-11-11 DIAGNOSIS — I2510 Atherosclerotic heart disease of native coronary artery without angina pectoris: Secondary | ICD-10-CM | POA: Diagnosis present

## 2024-11-11 DIAGNOSIS — Z90711 Acquired absence of uterus with remaining cervical stump: Secondary | ICD-10-CM | POA: Diagnosis not present

## 2024-11-11 DIAGNOSIS — Z825 Family history of asthma and other chronic lower respiratory diseases: Secondary | ICD-10-CM | POA: Diagnosis not present

## 2024-11-11 DIAGNOSIS — C8651 Angioimmunoblastic t-cell lymphoma, in remission: Secondary | ICD-10-CM | POA: Diagnosis present

## 2024-11-11 LAB — BASIC METABOLIC PANEL WITH GFR
Anion gap: 15 (ref 5–15)
BUN: 16 mg/dL (ref 8–23)
CO2: 21 mmol/L — ABNORMAL LOW (ref 22–32)
Calcium: 9.4 mg/dL (ref 8.9–10.3)
Chloride: 99 mmol/L (ref 98–111)
Creatinine, Ser: 0.96 mg/dL (ref 0.44–1.00)
GFR, Estimated: >60 mL/min
Glucose, Bld: 163 mg/dL — ABNORMAL HIGH (ref 70–99)
Potassium: 4.2 mmol/L (ref 3.5–5.1)
Sodium: 135 mmol/L (ref 135–145)

## 2024-11-11 MED ORDER — METHOCARBAMOL 500 MG PO TABS
500.0000 mg | ORAL_TABLET | Freq: Three times a day (TID) | ORAL | Status: DC | PRN
  Administered 2024-11-11: 500 mg via ORAL
  Filled 2024-11-11: qty 1

## 2024-11-11 MED ORDER — GUAIFENESIN-DM 100-10 MG/5ML PO SYRP
5.0000 mL | ORAL_SOLUTION | ORAL | Status: DC | PRN
  Administered 2024-11-11 – 2024-11-12 (×2): 5 mL via ORAL
  Filled 2024-11-11 (×2): qty 5

## 2024-11-11 NOTE — Progress Notes (Signed)
 Pt refused Lovenox  injection, says she feels she didn't need it. MD Adefeso notified. Pt care is currently ongoing.

## 2024-11-11 NOTE — Plan of Care (Signed)
  Problem: Education: Goal: Knowledge of General Education information will improve Description: Including pain rating scale, medication(s)/side effects and non-pharmacologic comfort measures Outcome: Progressing   Problem: Health Behavior/Discharge Planning: Goal: Ability to manage health-related needs will improve Outcome: Progressing   Problem: Clinical Measurements: Goal: Ability to maintain clinical measurements within normal limits will improve Outcome: Progressing Goal: Will remain free from infection Outcome: Progressing Goal: Diagnostic test results will improve Outcome: Progressing Goal: Respiratory complications will improve Outcome: Progressing Goal: Cardiovascular complication will be avoided Outcome: Progressing   Problem: Activity: Goal: Risk for activity intolerance will decrease Outcome: Progressing   Problem: Nutrition: Goal: Adequate nutrition will be maintained Outcome: Progressing   Problem: Coping: Goal: Level of anxiety will decrease Outcome: Progressing   Problem: Elimination: Goal: Will not experience complications related to bowel motility Outcome: Progressing Goal: Will not experience complications related to urinary retention Outcome: Progressing   Problem: Pain Managment: Goal: General experience of comfort will improve and/or be controlled Outcome: Progressing   Problem: Safety: Goal: Ability to remain free from injury will improve Outcome: Progressing   Problem: Skin Integrity: Goal: Risk for impaired skin integrity will decrease Outcome: Progressing   Problem: Education: Goal: Ability to demonstrate management of disease process will improve Outcome: Progressing Goal: Ability to verbalize understanding of medication therapies will improve Outcome: Progressing Goal: Individualized Educational Video(s) Outcome: Progressing   Problem: Activity: Goal: Capacity to carry out activities will improve Outcome: Progressing    Problem: Cardiac: Goal: Ability to achieve and maintain adequate cardiopulmonary perfusion will improve Outcome: Progressing   Problem: Education: Goal: Knowledge of General Education information will improve Description: Including pain rating scale, medication(s)/side effects and non-pharmacologic comfort measures Outcome: Progressing   Problem: Health Behavior/Discharge Planning: Goal: Ability to manage health-related needs will improve Outcome: Progressing   Problem: Clinical Measurements: Goal: Ability to maintain clinical measurements within normal limits will improve Outcome: Progressing Goal: Will remain free from infection Outcome: Progressing Goal: Diagnostic test results will improve Outcome: Progressing Goal: Respiratory complications will improve Outcome: Progressing Goal: Cardiovascular complication will be avoided Outcome: Progressing   Problem: Activity: Goal: Risk for activity intolerance will decrease Outcome: Progressing   Problem: Nutrition: Goal: Adequate nutrition will be maintained Outcome: Progressing   Problem: Coping: Goal: Level of anxiety will decrease Outcome: Progressing   Problem: Elimination: Goal: Will not experience complications related to bowel motility Outcome: Progressing Goal: Will not experience complications related to urinary retention Outcome: Progressing   Problem: Pain Managment: Goal: General experience of comfort will improve and/or be controlled Outcome: Progressing   Problem: Safety: Goal: Ability to remain free from injury will improve Outcome: Progressing   Problem: Skin Integrity: Goal: Risk for impaired skin integrity will decrease Outcome: Progressing   Problem: Education: Goal: Ability to demonstrate management of disease process will improve Outcome: Progressing Goal: Ability to verbalize understanding of medication therapies will improve Outcome: Progressing Goal: Individualized Educational  Video(s) Outcome: Progressing   Problem: Activity: Goal: Capacity to carry out activities will improve Outcome: Progressing   Problem: Cardiac: Goal: Ability to achieve and maintain adequate cardiopulmonary perfusion will improve Outcome: Progressing

## 2024-11-11 NOTE — TOC CM/SW Note (Signed)
 Transition of Care [facility]) - Inpatient Brief Assessment   Patient Details  Name: Deborah Cooley MRN: [id number] Date of Birth: 04/23/1956  Transition of Care [facility]) CM/SW Contact:    Lucie Lunger, LCSWA Phone Number: 11/11/2024, 8:25 AM   Clinical Narrative: Transition of Care Department [facility]) has reviewed patient and no TOC needs have been identified at this time. We will continue to monitor patient advancement through interdiciplinary progression rounds. If new patient transition needs arise, please place a TOC consult.  Transition of Care Asessment: Insurance and Status: Insurance coverage has been reviewed Patient has primary care physician: Yes Home environment has been reviewed: From home Prior level of function:: Independent Prior/Current Home Services: No current home services Social Drivers of Health Review: SDOH reviewed no interventions necessary Readmission risk has been reviewed: Yes Transition of care needs: no transition of care needs at this time

## 2024-11-11 NOTE — Care Management Obs Status (Signed)
 MEDICARE OBSERVATION STATUS NOTIFICATION   Patient Details  Name: Deborah Cooley MRN: [id number] Date of Birth: 25-Sep-1956   Medicare Observation Status Notification Given:  Yes    [name], LCSWA 11/11/2024, 1:29 PM

## 2024-11-11 NOTE — Progress Notes (Addendum)
 " PROGRESS NOTE    Deborah Cooley  FMW:[id number] DOB: Oct 01, 1956 DOA: 11/10/2024 PCP: Adine Duwaine MATSU, FNP   Brief Narrative:    Deborah Cooley is a 68 y.o. female with medical history significant for systolic CHF, lymphoma, coronary artery disease, hypertension. Patient presented to the ED with complaints of progressive difficulty breathing, abdominal swelling with cough that started a few weeks ago.  She reports upper abdominal swelling, and difficulty breathing with minimal exertion.  She unfortunately was prescribed Lasix  instead of her usual home medication of torsemide  and therefore started to become more volume overloaded.  She has been started on IV Lasix  for diuresis.  Assessment & Plan:   Principal Problem:   Acute on chronic HFrEF (heart failure with reduced ejection fraction) (HCC) Active Problems:   Essential hypertension, benign   Angioimmunoblastic lymphoma (HCC)   Coronary artery disease  Assessment and Plan:   Acute on chronic systolic CHF-presented with dyspnea on exertion, abdominal distention.  Baseline weight of 111-115, she has not checked her weight recently.  proBNP elevated at 17,315.  Troponin 22 > 21.  Chest x-ray with history of small bilateral pleural effusions.  Last echo 06/2024 EF of 30 to 35%.  Was in the ED with same complaints 12/21, left AMA.  Supposed to be on torsemide  30 mg daily, but Lasix  20 mg daily was called in when she requested refill of torsemide . - IV Lasix  40 twice daily - Input output, daily weights, daily BMP - Entresto  and spironolactone  were discontinued due to syncope in June 2025. Farxiga  was discontinued due to UTI    Hypertension-stable. - Resume losartan , metoprolol    Coronary artery disease-no chest pain.  EKG unchanged.  Troponin 22>> 21.LHC in July 2025 showed multivessel CAD, sequential 50 to 60% proximal/mid LAD stenosis involving D2, sequential 50 to 70% OM 2 lesions and CTO of mid RCA. Mildly elevated LVEDP, 21  mmHg. Medical management was recommended. -  - Resume aspirin , Lipitor, losartan , metoprolol    Lymphoma-follows with Dr. Onesimo, completed therapy 6 years ago.  Last visit 01/2024.  Currently under surveillance, yearly.   COPD-stable. - Albuterol  nebs as needed    DVT prophylaxis:Lovenox  Code Status: Full Family Communication: Daughter at bedside 12/24 Disposition Plan:  Status is: Observation The patient remains OBS appropriate and will d/c before 2 midnights.   Consultants:  None  Procedures:  None  Antimicrobials:  None   Subjective: Patient seen and evaluated today with no new acute complaints or concerns. No acute concerns or events noted overnight.  She states that her breathing is slowly improving and she is frequently using the restroom for urinary output.  She states that her abdominal swelling is slowly improving, but overall she has not returned to her baseline.  Objective: Vitals:   11/10/24 2152 11/11/24 0038 11/11/24 0428 11/11/24 0500  BP: 136/70  (!) 148/72   Pulse: 97  93   Resp: 20  19   Temp: 98.5 F (36.9 C)  98.9 F (37.2 C)   TempSrc: Oral     SpO2: 97% 97% 95%   Weight:    51.7 kg  Height:        Intake/Output Summary (Last 24 hours) at 11/11/2024 0700 Last data filed at 11/11/2024 0543 Gross per 24 hour  Intake 600 ml  Output --  Net 600 ml   Filed Weights   11/10/24 1412 11/10/24 1830 11/11/24 0500  Weight: 52.2 kg 52.6 kg 51.7 kg    Examination:  General exam:  Appears calm and comfortable  Respiratory system: Clear to auscultation. Respiratory effort normal. Cardiovascular system: S1 & S2 heard, RRR.  Gastrointestinal system: Abdomen is soft Central nervous system: Alert and awake Extremities: No edema Skin: No significant lesions noted Psychiatry: Flat affect.    Data Reviewed: I have personally reviewed following labs and imaging studies  CBC: Recent Labs  Lab 11/08/24 1639 11/10/24 1517  WBC 8.9 8.7  NEUTROABS  --   5.5  HGB 11.0* 11.8*  HCT 35.4* 37.7  MCV 83.9 82.5  PLT 173 200   Basic Metabolic Panel: Recent Labs  Lab 11/08/24 1639 11/10/24 1517 11/11/24 0427  NA 133* 134* 135  K 4.5 4.6 4.2  CL 100 98 99  CO2 20* 20* 21*  GLUCOSE 132* 162* 163*  BUN 14 16 16   CREATININE 0.93 0.95 0.96  CALCIUM  9.9 10.2 9.4  MG  --  2.2  --    GFR: Estimated Creatinine Clearance: 43.7 mL/min (by C-G formula based on SCr of 0.96 mg/dL). Liver Function Tests: Recent Labs  Lab 11/08/24 1639 11/10/24 1517  AST 56* 39  ALT 57* 58*  ALKPHOS 73 88  BILITOT 0.9 0.9  PROT 6.4* 7.3  ALBUMIN 4.1 4.7   No results for input(s): LIPASE, AMYLASE in the last 168 hours. No results for input(s): AMMONIA in the last 168 hours. Coagulation Profile: No results for input(s): INR, PROTIME in the last 168 hours. Cardiac Enzymes: No results for input(s): CKTOTAL, CKMB, CKMBINDEX, TROPONINI in the last 168 hours. BNP (last 3 results) Recent Labs    11/08/24 1639 11/10/24 1517  PROBNP 12,793.0* 17,315.0*   HbA1C: No results for input(s): HGBA1C in the last 72 hours. CBG: No results for input(s): GLUCAP in the last 168 hours. Lipid Profile: No results for input(s): CHOL, HDL, LDLCALC, TRIG, CHOLHDL, LDLDIRECT in the last 72 hours. Thyroid Function Tests: No results for input(s): TSH, T4TOTAL, FREET4, T3FREE, THYROIDAB in the last 72 hours. Anemia Panel: No results for input(s): VITAMINB12, FOLATE, FERRITIN, TIBC, IRON, RETICCTPCT in the last 72 hours. Sepsis Labs: No results for input(s): PROCALCITON, LATICACIDVEN in the last 168 hours.  No results found for this or any previous visit (from the past 240 hours).       Radiology Studies: DG Chest Port 1 View Result Date: 11/10/2024 CLINICAL DATA:  Shortness of breath. EXAM: PORTABLE CHEST 1 VIEW COMPARISON:  Chest radiograph dated 11/08/2024. FINDINGS: Cardiomegaly with vascular congestion  and edema. Small bilateral pleural effusions and bibasilar atelectasis or infiltrate slightly progressed since the prior radiograph. No pneumothorax. Atherosclerotic calcification of the aorta. Right thyroid rim calcified nodule. No acute osseous pathology. IMPRESSION: Cardiomegaly with findings of CHF and small bilateral pleural effusions. Electronically Signed   By: Vanetta Chou M.D.   On: 11/10/2024 16:16        Scheduled Meds:  aspirin  EC  81 mg Oral Daily   atorvastatin   80 mg Oral Daily   enoxaparin  (LOVENOX ) injection  40 mg Subcutaneous QHS   furosemide   40 mg Intravenous Q12H     LOS: 0 days    Time spent: 55 minutes    Jeancarlo Leffler JONETTA Fairly, DO [facility]  If 7PM-7AM, please contact night-coverage [email or web address] 11/11/2024, 7:00 AM   "

## 2024-11-11 NOTE — Progress Notes (Signed)
 Heart Failure Stewardship Pharmacy Note  PCP: Adine Duwaine MATSU, FNP PCP-Cardiologist: Diannah SHAUNNA Maywood, MD  HPI: Deborah Cooley is a 68 y.o. female with CHF, CAD, hypertension, tobacco abuse, non-Hodgkin's lymphoma, hyperlipidemia who presented with shortness of breath. On admission, BNP was 87206, HS-troponin was 22, CO2 20, AST 56, ALT 57. Chest x-ray noted findings of CHF and small bilateral pleural effusions.   Pertinent cardiac history: Stress echo in 05/2011 with LVEF of 60% and no evidence of ischemia. TTE 03/2024 showed LVEF down to 35-40%, small pericardial effusion. Coronary CT 04/2024 noted CAC score of 477 (93rd percentile) predominantly in prox-mid RCA. LHC 05/2024 showed multivessel CAD with 50-60% priximal/mid LAD involiving D2, 50-70% OM2 lesions, and CTO of mid RCA. LVEF noted to be out of proportion to CAD. TTE 06/2024 noted LVEF of 30-35% with low normal RV function, mild MR.  Pertinent Lab Values: Creatinine  Date Value Ref Range Status  02/12/2024 1.03 (H) 0.44 - 1.00 mg/dL Final   Creatinine, Ser  Date Value Ref Range Status  11/11/2024 0.96 0.44 - 1.00 mg/dL Final   BUN  Date Value Ref Range Status  11/11/2024 16 8 - 23 mg/dL Final   Potassium  Date Value Ref Range Status  11/11/2024 4.2 3.5 - 5.1 mmol/L Final   Sodium  Date Value Ref Range Status  11/11/2024 135 135 - 145 mmol/L Final   B Natriuretic Peptide  Date Value Ref Range Status  06/24/2024 1,961.3 (H) 0.0 - 100.0 pg/mL Final    Comment:    Performed at [facility], [address] [place], [place] [address]   Magnesium   Date Value Ref Range Status  11/10/2024 2.2 1.7 - 2.4 mg/dL Final    Comment:    Performed at [facility], [address] [place], [place] [address]   Hgb A1c MFr Bld  Date Value Ref Range Status  06/17/2024 6.8 (H) 4.8 - 5.6 % Final    Comment:    (NOTE) Diagnosis of Diabetes The following HbA1c ranges recommended by the American Diabetes Association  (ADA) may be used as an aid in the diagnosis of diabetes mellitus.  Hemoglobin             Suggested A1C NGSP%              Diagnosis  <5.7                   Non Diabetic  5.7-6.4                Pre-Diabetic  >6.4                   Diabetic  <7.0                   Glycemic control for                       adults with diabetes.     TSH  Date Value Ref Range Status  06/18/2024 2.080 0.350 - 4.500 uIU/mL Final    Comment:    Performed by a 3rd Generation assay with a functional sensitivity of <=0.01 uIU/mL. Performed at [facility], [address], [place], [place] [address]    LDH  Date Value Ref Range Status  02/12/2024 134 98 - 192 U/L Final    Comment:    Performed at [facility], [address] [place], [place] [address]  Vital Signs: Temp:  [97.4 F (36.3 C)-98.9 F (37.2 C)] 98.9 F (37.2 C) (12/24 0428) Pulse Rate:  [93-109] 93 (12/24 0428) Cardiac Rhythm: Sinus tachycardia (12/23 1900) Resp:  [12-26] 19 (12/24 0428) BP: (135-151)/(49-84) 148/72 (12/24 0428) SpO2:  [95 %-98 %] 95 % (12/24 0428) Weight:  [51.7 kg (113 lb 15.7 oz)-52.6 kg (115 lb 15.4 oz)] 51.7 kg (113 lb 15.7 oz) (12/24 0500)  Intake/Output Summary (Last 24 hours) at 11/11/2024 9257 Last data filed at 11/11/2024 0543 Gross per 24 hour  Intake 600 ml  Output --  Net 600 ml    Current Heart Failure Medications:  Loop diuretic: furosemide  40 mg IV q12h Beta-Blocker: none ACEI/ARB/ARNI: none MRA: none  SGLT2i: none Other: none  Prior to admission Heart Failure Medications:  Loop diuretic: torsemide  30 mg daily (recent fill for furosemide  20 mg daily) Beta-Blocker: metoprolol  succinate 25 mg daily ACEI/ARB/ARNI: losartan  25 mg daily MRA: none SGLT2i: none Other: none  Assessment: 1. Acute on chronic systolic heart failure (LVEF 30-35%)  , due to NICM. NYHA class III symptoms.  *Examination conducted via chart review and telephone due to off-site  location. -Symptoms: Patient reports mildly improved shortness of breath. Still with significant orthopnea. Reports LEE.  -Volume: Difficult to assess comprehensively with chart review and patient report alone. Suspect she is still significantly hypervolemic. Renal function stable. Weight down 2 lbs. Unsure of I/O accuracy. Recently filled torsemide  and furosemide . Can consider increasing furosemide  to 80 mg IV BID if inadequate response to current dose. -Hemodynamics: BP is elevated. HR 90-100s.  -BB: Would consider adding BB after patient is euvolemic. -ACEI/ARB/ARNI: Consider adding home losartan  25 mg daily today. -MRA: Can consider adding spironolactone  at low dose if K is stable on losartan . Previously stopped in the past due to orthostasis, however, this may have been due to hypovolemia which is not an issue at this time. -SGLT2i: Farxiga  previously stopped due to UTI. Could consider Jardiance trial at some point in the future.  Plan: 1) Medication changes recommended at this time: -Consider restarting losartan  25 mg daily  2) Patient assistance: -Pending  3) Education: - Patient has been educated on current HF medications and potential additions to HF medication regimen - Patient verbalizes understanding that over the next few months, these medication doses may change and more medications may be added to optimize HF regimen - Patient has been educated on basic disease state pathophysiology and goals of therapy  Please do not hesitate to reach out with questions or concerns,  Jaun Bash, PharmD, CPP, BCPS, [facility] Heart Failure Pharmacist  Phone - [phone] 11/11/2024 10:08 AM

## 2024-11-12 DIAGNOSIS — I5023 Acute on chronic systolic (congestive) heart failure: Secondary | ICD-10-CM | POA: Diagnosis not present

## 2024-11-12 LAB — BASIC METABOLIC PANEL WITH GFR
Anion gap: 10 (ref 5–15)
BUN: 16 mg/dL (ref 8–23)
CO2: 30 mmol/L (ref 22–32)
Calcium: 9.3 mg/dL (ref 8.9–10.3)
Chloride: 95 mmol/L — ABNORMAL LOW (ref 98–111)
Creatinine, Ser: 1.16 mg/dL — ABNORMAL HIGH (ref 0.44–1.00)
GFR, Estimated: 51 mL/min — ABNORMAL LOW
Glucose, Bld: 178 mg/dL — ABNORMAL HIGH (ref 70–99)
Potassium: 3.1 mmol/L — ABNORMAL LOW (ref 3.5–5.1)
Sodium: 135 mmol/L (ref 135–145)

## 2024-11-12 LAB — MAGNESIUM: Magnesium: 2 mg/dL (ref 1.7–2.4)

## 2024-11-12 MED ORDER — POTASSIUM CHLORIDE CRYS ER 20 MEQ PO TBCR
40.0000 meq | EXTENDED_RELEASE_TABLET | Freq: Once | ORAL | Status: AC
  Administered 2024-11-12: 40 meq via ORAL
  Filled 2024-11-12: qty 2

## 2024-11-12 MED ORDER — TORSEMIDE 20 MG PO TABS: 30.0000 mg | ORAL_TABLET | Freq: Every day | ORAL | 3 refills | Status: AC

## 2024-11-12 MED ORDER — TORSEMIDE 20 MG PO TABS
30.0000 mg | ORAL_TABLET | Freq: Every day | ORAL | Status: DC
  Administered 2024-11-12: 30 mg via ORAL
  Filled 2024-11-12: qty 2

## 2024-11-12 NOTE — Plan of Care (Signed)

## 2024-11-12 NOTE — TOC Progression Note (Signed)
 Transition of Care [facility]) - Progression Note    Patient Details  Name: Deborah Cooley MRN: [id number] Date of Birth: August 25, 1956  [facility] of [facility]) CM/SW Contact  Hoy DELENA Bigness, LCSW Phone Number: 11/12/2024, 3:22 PM  Clinical Narrative:    CSW reached out to [facility] regarding pt's need for Nebulizer machine. [employer] to follow up with pt's PCP to obtain order and will deliver to pt's home once order received.      Barriers to Discharge: Continued Medical Work up               Expected Discharge Plan and Services         Expected Discharge Date: 11/12/24                                     Social Drivers of Health (SDOH) Interventions SDOH Screenings   Food Insecurity: No Food Insecurity (11/10/2024)  Housing: Low Risk (11/10/2024)  Transportation Needs: No Transportation Needs (11/10/2024)  Utilities: Not At Risk (11/10/2024)  Financial Resource Strain: Low Risk (10/12/2024)   Received from [facility]  Physical Activity: Insufficiently Active (10/12/2024)   Received from [facility]  Social Connections: Moderately Integrated (11/10/2024)  Stress: Stress Concern Present (10/12/2024)   Received from [facility]  Tobacco Use: High Risk (11/10/2024)    Readmission Risk Interventions    06/17/2024   10:45 AM  Readmission Risk Prevention Plan  Transportation Screening Complete  Home Care Screening Complete  Medication Review (RN CM) Complete

## 2024-11-12 NOTE — Discharge Summary (Signed)
 Physician Discharge Summary  Deborah Cooley FMW:[id number] DOB: 1956/07/16 DOA: 11/10/2024  PCP: Adine Duwaine MATSU, FNP  Admit date: 11/10/2024  Discharge date: 11/12/2024  Admitted From:Home  Disposition:  Home  Recommendations for Outpatient Follow-up:  Follow up with PCP in 1-2 weeks Refills provided on torsemide  30 mg daily and patient given a dose of torsemide  to take on the day of discharge as she will not be able to get refills until 12/26 Continue other home medications as prior and Lasix  discontinued  Home Health: None  Equipment/Devices: None  Discharge Condition:Stable  CODE STATUS: Full  Diet recommendation: Heart Healthy  Brief/Interim Summary:  Deborah Cooley is a 68 y.o. female with medical history significant for systolic CHF, lymphoma, coronary artery disease, hypertension. Patient presented to the ED with complaints of progressive difficulty breathing, abdominal swelling with cough that started a few weeks ago.  She reports upper abdominal swelling, and difficulty breathing with minimal exertion.  She unfortunately was prescribed Lasix  instead of her usual home medication of torsemide  and therefore started to become more volume overloaded.  She was admitted for acute on chronic systolic CHF exacerbation and she was started on IV Lasix  for diuresis and now feels as though she is back to her usual baseline with no significant abdominal swelling or difficulty breathing.  She is eager for discharge and will have 1 dose of torsemide  given to her today and will continue torsemide  30 mg daily at home.  Discharge Diagnoses:  Principal Problem:   Acute on chronic HFrEF (heart failure with reduced ejection fraction) (HCC) Active Problems:   Essential hypertension, benign   Angioimmunoblastic lymphoma (HCC)   Coronary artery disease   Acute on chronic systolic CHF (congestive heart failure) (HCC)  Principal discharge diagnosis: Acute on chronic systolic CHF  exacerbation due to improper diuretic prescription.  Discharge Instructions  Discharge Instructions     Increase activity slowly   Complete by: As directed       Allergies as of 11/12/2024       Reactions   Ambien  [zolpidem ] Other (See Comments)    Confusion, lightheadedness, dizzy, bad dreams   Hydrocodone Other (See Comments)   Patient states it gives her night mares        Medication List     STOP taking these medications    furosemide  20 MG tablet Commonly known as: LASIX        TAKE these medications    albuterol  108 (90 Base) MCG/ACT inhaler Commonly known as: VENTOLIN  HFA Inhale 2 puffs into the lungs every 6 (six) hours as needed for wheezing or shortness of breath.   ALPRAZolam  0.5 MG tablet Commonly known as: XANAX  Take 0.5 mg by mouth 2 (two) times daily as needed for anxiety or sleep.   aspirin  EC 81 MG tablet Take 1 tablet (81 mg total) by mouth daily. Swallow whole.   atorvastatin  80 MG tablet Commonly known as: LIPITOR Take 1 tablet (80 mg total) by mouth daily.   losartan  25 MG tablet Commonly known as: COZAAR  Take 1 tablet (25 mg total) by mouth daily.   multivitamin with iron-minerals liquid Take by mouth.   nitroGLYCERIN  0.4 MG SL tablet Commonly known as: NITROSTAT  Place 1 tablet (0.4 mg total) under the tongue every 5 (five) minutes as needed for chest pain. If a single episode of chest pain is not relieved by one tablet, the patient will try another within 5 minutes; and if this doesn't relieve the pain, the patient is instructed  to call 911 for transportation to an emergency department.   potassium chloride  SA 20 MEQ tablet Commonly known as: KLOR-CON  M Take 1 tablet (20 mEq total) by mouth daily.   torsemide  20 MG tablet Commonly known as: DEMADEX  Take 1.5 tablets (30 mg total) by mouth daily.        Follow-up Information     Adine Duwaine MATSU, FNP. Schedule an appointment as soon as possible for a visit in 1 week(s).    Specialty: Family Medicine Contact information: [address] [place] [address] [phone]                Allergies[1]  Consultations: None   Procedures/Studies: [facility] Chest Port 1 View Result Date: 11/10/2024 CLINICAL DATA:  Shortness of breath. EXAM: PORTABLE CHEST 1 VIEW COMPARISON:  Chest radiograph dated 11/08/2024. FINDINGS: Cardiomegaly with vascular congestion and edema. Small bilateral pleural effusions and bibasilar atelectasis or infiltrate slightly progressed since the prior radiograph. No pneumothorax. Atherosclerotic calcification of the aorta. Right thyroid rim calcified nodule. No acute osseous pathology. IMPRESSION: Cardiomegaly with findings of CHF and small bilateral pleural effusions. Electronically Signed   By: Vanetta Chou M.D.   On: 11/10/2024 16:16   DG Chest 2 View Result Date: 11/08/2024 CLINICAL DATA:  Shortness of breath. EXAM: DG CHEST 2V COMPARISON:  Radiograph 06/18/2024, CT 06/16/2024 FINDINGS: The heart is mildly enlarged. Stable mediastinal contours with aortic atherosclerosis. Mild to moderate pulmonary edema. Small bilateral pleural effusions. No confluent opacity. No pneumothorax. Calcified right thyroid nodule. No acute osseous findings. IMPRESSION: 1. Mild to moderate pulmonary edema with small bilateral pleural effusions. 2. Mild cardiomegaly. Electronically Signed   By: Andrea Gasman M.D.   On: 11/08/2024 15:50     Discharge Exam: Vitals:   11/12/24 0350 11/12/24 0814  BP: (!) 149/76   Pulse: (!) 102   Resp: 16   Temp: 98.3 F (36.8 C)   SpO2: 95% 96%   Vitals:   11/11/24 2139 11/12/24 0350 11/12/24 0450 11/12/24 0814  BP:  (!) 149/76    Pulse:  (!) 102    Resp:  16    Temp:  98.3 F (36.8 C)    TempSrc:  Oral    SpO2: 96% 95%  96%  Weight:   50.6 kg   Height:        General: Deborah Cooley is alert, awake, not in acute distress Cardiovascular: RRR, S1/S2 +, no rubs, no gallops Respiratory: CTA  bilaterally, no wheezing, no rhonchi Abdominal: Soft, NT, ND, bowel sounds + Extremities: no edema, no cyanosis    The results of significant diagnostics from this hospitalization (including imaging, microbiology, ancillary and laboratory) are listed below for reference.     Microbiology: No results found for this or any previous visit (from the past 240 hours).   Labs: BNP (last 3 results) Recent Labs    03/30/24 0912 06/16/24 0922 06/24/24 0918  BNP 1,089.5* 2,829.0* 1,961.3*   Basic Metabolic Panel: Recent Labs  Lab 11/08/24 1639 11/10/24 1517 11/11/24 0427 11/12/24 0455  NA 133* 134* 135 135  K 4.5 4.6 4.2 3.1*  CL 100 98 99 95*  CO2 20* 20* 21* 30  GLUCOSE 132* 162* 163* 178*  BUN 14 16 16 16   CREATININE 0.93 0.95 0.96 1.16*  CALCIUM  9.9 10.2 9.4 9.3  MG  --  2.2  --  2.0   Liver Function Tests: Recent Labs  Lab 11/08/24 1639 11/10/24 1517  AST 56* 39  ALT 57* 58*  ALKPHOS 73 88  BILITOT 0.9 0.9  PROT 6.4* 7.3  ALBUMIN 4.1 4.7   No results for input(s): LIPASE, AMYLASE in the last 168 hours. No results for input(s): AMMONIA in the last 168 hours. CBC: Recent Labs  Lab 11/08/24 1639 11/10/24 1517  WBC 8.9 8.7  NEUTROABS  --  5.5  HGB 11.0* 11.8*  HCT 35.4* 37.7  MCV 83.9 82.5  PLT 173 200   Cardiac Enzymes: No results for input(s): CKTOTAL, CKMB, CKMBINDEX, TROPONINI in the last 168 hours. BNP: Invalid input(s): POCBNP CBG: No results for input(s): GLUCAP in the last 168 hours. D-Dimer No results for input(s): DDIMER in the last 72 hours. Hgb A1c No results for input(s): HGBA1C in the last 72 hours. Lipid Profile No results for input(s): CHOL, HDL, LDLCALC, TRIG, CHOLHDL, LDLDIRECT in the last 72 hours. Thyroid function studies No results for input(s): TSH, T4TOTAL, T3FREE, THYROIDAB in the last 72 hours.  Invalid input(s): FREET3 Anemia work up No results for input(s): VITAMINB12,  FOLATE, FERRITIN, TIBC, IRON, RETICCTPCT in the last 72 hours. Urinalysis    Component Value Date/Time   COLORURINE YELLOW 06/16/2024 1003   APPEARANCEUR HAZY (A) 06/16/2024 1003   LABSPEC 1.015 06/16/2024 1003   PHURINE 6.0 06/16/2024 1003   GLUCOSEU >=500 (A) 06/16/2024 1003   HGBUR NEGATIVE 06/16/2024 1003   BILIRUBINUR NEGATIVE 06/16/2024 1003   KETONESUR NEGATIVE 06/16/2024 1003   PROTEINUR 30 (A) 06/16/2024 1003   NITRITE NEGATIVE 06/16/2024 1003   LEUKOCYTESUR NEGATIVE 06/16/2024 1003   Sepsis Labs Recent Labs  Lab 11/08/24 1639 11/10/24 1517  WBC 8.9 8.7   Microbiology No results found for this or any previous visit (from the past 240 hours).   Time coordinating discharge: 35 minutes  SIGNED:   Adron JONETTA Fairly, DO [facility] 11/12/2024, 10:50 AM  If 7PM-7AM, please contact night-coverage [email or web address]     [1]  Allergies Allergen Reactions   Ambien  [Zolpidem ] Other (See Comments)     Confusion, lightheadedness, dizzy, bad dreams   Hydrocodone Other (See Comments)    Patient states it gives her night mares

## 2024-11-12 NOTE — Plan of Care (Signed)
 " Problem: Education: Goal: Knowledge of General Education information will improve Description: Including pain rating scale, medication(s)/side effects and non-pharmacologic comfort measures 11/12/2024 1115 by Elner Ames MATSU, RN Outcome: Adequate for Discharge 11/12/2024 1112 by Elner Ames MATSU, RN Outcome: Adequate for Discharge   Problem: Health Behavior/Discharge Planning: Goal: Ability to manage health-related needs will improve 11/12/2024 1115 by Elner Ames MATSU, RN Outcome: Adequate for Discharge 11/12/2024 1112 by Elner Ames MATSU, RN Outcome: Adequate for Discharge   Problem: Clinical Measurements: Goal: Ability to maintain clinical measurements within normal limits will improve 11/12/2024 1115 by Elner Ames MATSU, RN Outcome: Adequate for Discharge 11/12/2024 1112 by Elner Ames MATSU, RN Outcome: Adequate for Discharge Goal: Will remain free from infection 11/12/2024 1115 by Elner Ames MATSU, RN Outcome: Adequate for Discharge 11/12/2024 1112 by Elner Ames MATSU, RN Outcome: Adequate for Discharge Goal: Diagnostic test results will improve 11/12/2024 1115 by Elner Ames MATSU, RN Outcome: Adequate for Discharge 11/12/2024 1112 by Elner Ames MATSU, RN Outcome: Adequate for Discharge Goal: Respiratory complications will improve 11/12/2024 1115 by Elner Ames MATSU, RN Outcome: Adequate for Discharge 11/12/2024 1112 by Elner Ames MATSU, RN Outcome: Adequate for Discharge Goal: Cardiovascular complication will be avoided 11/12/2024 1115 by Elner Ames MATSU, RN Outcome: Adequate for Discharge 11/12/2024 1112 by Elner Ames MATSU, RN Outcome: Adequate for Discharge   Problem: Activity: Goal: Risk for activity intolerance will decrease 11/12/2024 1115 by Elner Ames MATSU, RN Outcome: Adequate for Discharge 11/12/2024 1112 by Elner Ames MATSU, RN Outcome: Adequate for Discharge   Problem: Nutrition: Goal: Adequate nutrition will be  maintained 11/12/2024 1115 by Elner Ames MATSU, RN Outcome: Adequate for Discharge 11/12/2024 1112 by Elner Ames MATSU, RN Outcome: Adequate for Discharge   Problem: Coping: Goal: Level of anxiety will decrease 11/12/2024 1115 by Elner Ames MATSU, RN Outcome: Adequate for Discharge 11/12/2024 1112 by Elner Ames MATSU, RN Outcome: Adequate for Discharge   Problem: Elimination: Goal: Will not experience complications related to bowel motility 11/12/2024 1115 by Elner Ames MATSU, RN Outcome: Adequate for Discharge 11/12/2024 1112 by Elner Ames MATSU, RN Outcome: Adequate for Discharge Goal: Will not experience complications related to urinary retention 11/12/2024 1115 by Elner Ames MATSU, RN Outcome: Adequate for Discharge 11/12/2024 1112 by Elner Ames MATSU, RN Outcome: Adequate for Discharge   Problem: Pain Managment: Goal: General experience of comfort will improve and/or be controlled 11/12/2024 1115 by Elner Ames MATSU, RN Outcome: Adequate for Discharge 11/12/2024 1112 by Elner Ames MATSU, RN Outcome: Adequate for Discharge   Problem: Safety: Goal: Ability to remain free from injury will improve 11/12/2024 1115 by Elner Ames MATSU, RN Outcome: Adequate for Discharge 11/12/2024 1112 by Elner Ames MATSU, RN Outcome: Adequate for Discharge   Problem: Skin Integrity: Goal: Risk for impaired skin integrity will decrease 11/12/2024 1115 by Elner Ames MATSU, RN Outcome: Adequate for Discharge 11/12/2024 1112 by Elner Ames MATSU, RN Outcome: Adequate for Discharge   Problem: Education: Goal: Ability to demonstrate management of disease process will improve 11/12/2024 1115 by Elner Ames MATSU, RN Outcome: Adequate for Discharge 11/12/2024 1112 by Elner Ames MATSU, RN Outcome: Adequate for Discharge Goal: Ability to verbalize understanding of medication therapies will improve 11/12/2024 1115 by Elner Ames MATSU, RN Outcome: Adequate for  Discharge 11/12/2024 1112 by Elner Ames MATSU, RN Outcome: Adequate for Discharge Goal: Individualized Educational Video(s) 11/12/2024 1115 by Elner Ames MATSU, RN Outcome: Adequate for Discharge 11/12/2024 1112 by Elner Ames MATSU, RN Outcome: Adequate for Discharge   Problem: Activity: Goal:  Capacity to carry out activities will improve 11/12/2024 1115 by Elner Ames MATSU, RN Outcome: Adequate for Discharge 11/12/2024 1112 by Elner Ames MATSU, RN Outcome: Adequate for Discharge   Problem: Cardiac: Goal: Ability to achieve and maintain adequate cardiopulmonary perfusion will improve 11/12/2024 1115 by Elner Ames MATSU, RN Outcome: Adequate for Discharge 11/12/2024 1112 by Elner Ames MATSU, RN Outcome: Adequate for Discharge   "

## 2024-11-12 NOTE — Plan of Care (Signed)
 " Problem: Education: Goal: Knowledge of General Education information will improve Description: Including pain rating scale, medication(s)/side effects and non-pharmacologic comfort measures Outcome: Adequate for Discharge   Problem: Health Behavior/Discharge Planning: Goal: Ability to manage health-related needs will improve Outcome: Adequate for Discharge   Problem: Clinical Measurements: Goal: Ability to maintain clinical measurements within normal limits will improve Outcome: Adequate for Discharge Goal: Will remain free from infection Outcome: Adequate for Discharge Goal: Diagnostic test results will improve Outcome: Adequate for Discharge Goal: Respiratory complications will improve Outcome: Adequate for Discharge Goal: Cardiovascular complication will be avoided Outcome: Adequate for Discharge   Problem: Activity: Goal: Risk for activity intolerance will decrease Outcome: Adequate for Discharge   Problem: Nutrition: Goal: Adequate nutrition will be maintained Outcome: Adequate for Discharge   Problem: Coping: Goal: Level of anxiety will decrease Outcome: Adequate for Discharge   Problem: Elimination: Goal: Will not experience complications related to bowel motility Outcome: Adequate for Discharge Goal: Will not experience complications related to urinary retention Outcome: Adequate for Discharge   Problem: Pain Managment: Goal: General experience of comfort will improve and/or be controlled Outcome: Adequate for Discharge   Problem: Safety: Goal: Ability to remain free from injury will improve Outcome: Adequate for Discharge   Problem: Skin Integrity: Goal: Risk for impaired skin integrity will decrease Outcome: Adequate for Discharge   Problem: Education: Goal: Ability to demonstrate management of disease process will improve Outcome: Adequate for Discharge Goal: Ability to verbalize understanding of medication therapies will improve Outcome: Adequate  for Discharge Goal: Individualized Educational Video(s) Outcome: Adequate for Discharge   Problem: Activity: Goal: Capacity to carry out activities will improve Outcome: Adequate for Discharge   Problem: Cardiac: Goal: Ability to achieve and maintain adequate cardiopulmonary perfusion will improve Outcome: Adequate for Discharge   Problem: Education: Goal: Knowledge of General Education information will improve Description: Including pain rating scale, medication(s)/side effects and non-pharmacologic comfort measures Outcome: Adequate for Discharge   Problem: Health Behavior/Discharge Planning: Goal: Ability to manage health-related needs will improve Outcome: Adequate for Discharge   Problem: Clinical Measurements: Goal: Ability to maintain clinical measurements within normal limits will improve Outcome: Adequate for Discharge Goal: Will remain free from infection Outcome: Adequate for Discharge Goal: Diagnostic test results will improve Outcome: Adequate for Discharge Goal: Respiratory complications will improve Outcome: Adequate for Discharge Goal: Cardiovascular complication will be avoided Outcome: Adequate for Discharge   Problem: Activity: Goal: Risk for activity intolerance will decrease Outcome: Adequate for Discharge   Problem: Nutrition: Goal: Adequate nutrition will be maintained Outcome: Adequate for Discharge   Problem: Coping: Goal: Level of anxiety will decrease Outcome: Adequate for Discharge   Problem: Elimination: Goal: Will not experience complications related to bowel motility Outcome: Adequate for Discharge Goal: Will not experience complications related to urinary retention Outcome: Adequate for Discharge   Problem: Pain Managment: Goal: General experience of comfort will improve and/or be controlled Outcome: Adequate for Discharge   Problem: Safety: Goal: Ability to remain free from injury will improve Outcome: Adequate for Discharge    Problem: Skin Integrity: Goal: Risk for impaired skin integrity will decrease Outcome: Adequate for Discharge   Problem: Education: Goal: Ability to demonstrate management of disease process will improve Outcome: Adequate for Discharge Goal: Ability to verbalize understanding of medication therapies will improve Outcome: Adequate for Discharge Goal: Individualized Educational Video(s) Outcome: Adequate for Discharge   Problem: Activity: Goal: Capacity to carry out activities will improve Outcome: Adequate for Discharge   Problem: Cardiac: Goal: Ability to achieve  and maintain adequate cardiopulmonary perfusion will improve Outcome: Adequate for Discharge   "

## 2024-11-29 NOTE — Progress Notes (Unsigned)
 " Cardiology Office Note:  .   Date:  12/03/2024  ID:  Deborah Cooley, DOB Jan 08, 1956, MRN [id number] PCP: Adine Duwaine MATSU, FNP  [facility] Providers Cardiologist:  Diannah SHAUNNA Maywood, MD {   History of Present Illness: Deborah Cooley is a 69 y.o. female  with PMHx of chronic HFrEF, Hx of bilateral pleural effusion, CAD, HLD, HTN, non hodgkin lymphoma (remission, followed by Dr. Onesimo) who reports to [facility] office for follow up.   Pertinent cardiac medical history:  EF 2019 60-65% then new onset cardiomyopathy Echocardiogram in 03/2024 showed LVEF 35 to 40%, new onset cardiomyopathy and CVP was 3 mmHg. No evidence of valvular heart disease was noted.  CT cardiac was initially performed for ischemia evaluation, coronary calcium  score was noted to be 477 (93rd percentile for age and sex matched control), total plaque volume is extensive and there was noted to be subtotal proximal to mid RCA, 50 to 69% stenosis of the proximal OM1 and mid LAD.  These lesions were noted to be flow-limiting, possibly.   LHC in 05/2024 showed multivessel CAD, sequential 50 to 60% proximal/mid LAD stenosis involving D2, sequential 50 to 70% OM 2 lesions and CTO of mid RCA.  Mildly elevated LVEDP, 21 mmHg.  Medical management was recommended.  Entresto  and spironolactone  were discontinued due to syncope in June 2025. Farxiga  was discontinued due to UTI in June 2025.  She had recent hospitalization at [facility] in July 25 for ADHF. Bilateral pleural effusions resolved eventually.   Last seen in heartcare 07/17/2024 by Dr. Mallipeddi for hospital follow up. Doing well from cardiac perspective without any complaints. Noted restarted smoking after argument with husband. Appeared euvolemic on diuretic. Continue aspirin  81 mg once daily, atorvastatin  80 mg nightly, torsemide  30 mg once daily, metoprolol  succinate 25 mg once daily, losartan  25 mg once daily. Noted K improved from 5.6 to 4.5 on discharge.  Recommended considering low dose spirolactone in next clinic visit   Hospitalized 12/23-12/25/2025 for acute HFrEF felt secondary to being prescribed furosemide  instead of her usual torsemide , resulting in volume overload. She was treated with IV diuresis and discharged at a weight of 111 lbs. Furosemide  was discontinued, and she was resumed on torsemide . Per chart review, metoprolol  succinate 25 mg was previously discontinued due to patient concern for syncope. Continued on ASA 81 mg, Lipitor 80 mg, losartan  25 mg, NTG as needed, torsemide  30 mg daily, KCl 20 mEq daily.   Today, she reports feeling better since hospitalization with medication adjustments. She uses her nebulizer as needed, which improves symptoms. She reports stable shortness of breath with minimal exertion and sleeps on two pillows in a flat bed. She denies chest pain, palpitations, syncope, presyncope, dizziness, orthopnea, PND, swelling, significant weight changes, acute bleeding, or claudication. She reports medication compliance. She primarily drinks coffee with minimal water intake and endorses an unhealthy diet high in sodium and carbohydrates. She is able to complete household chores and walk around the grocery store without difficulty.  ROS: 10 point review of system has been reviewed and considered negative except ones been listed in the HPI.   Studies Reviewed: Deborah Cooley    Cath Conclusions: 05/2024 Multivessel coronary artery disease, as detailed below, including sequential 50-60% proximal/mid LAD stenoses involving D2, sequential 50-70% OM2 lesions, and chronic total occlusion of mid RCA. Mildly elevated left ventricular filling pressure (LVEDP 21 mmHg).   Recommendations: Add isosorbide  mononitrate for antianginal therapy.  Continue secondary prevention of CAD, including smoking  cessation. Continue escalation of goal-directed medical therapy for mixed ischemic and nonischemic cardiomyopathy (LVEF reduction is out of proportion  to the extent of CAD). Change furosemide  to 20 mg daily.  Diagnostic Dominance: Right   ECHO IMPRESSIONS 06/22/2024  1. Left ventricular ejection fraction, by estimation, is 30 to 35%. The  left ventricle has moderately decreased function. The left ventricle  demonstrates global hypokinesis. Left ventricular diastolic parameters are  indeterminate.   2. Right ventricular systolic function is low normal. The right  ventricular size is normal. There is normal pulmonary artery systolic  pressure.   3. The mitral valve is abnormal. Mild mitral valve regurgitation. No  evidence of mitral stenosis.   4. The tricuspid valve is abnormal.   5. The aortic valve was not well visualized. Aortic valve regurgitation  is not visualized. No aortic stenosis is present.   6. The inferior vena cava is normal in size with greater than 50%  respiratory variability, suggesting right atrial pressure of 3 mmHg.   CV Studies: Cardiac studies reviewed are outlined and summarized above. Otherwise please see EMR for full report.   Physical Exam:   VS:  BP 122/62   Pulse 91   Ht 5' 2 (1.575 m)   Wt 117 lb 3.2 oz (53.2 kg)   SpO2 96%   BMI 21.44 kg/m    Wt Readings from Last 3 Encounters:  12/03/24 117 lb 3.2 oz (53.2 kg)  11/12/24 111 lb 8.8 oz (50.6 kg)  11/08/24 115 lb (52.2 kg)    GEN: Well nourished, well developed in no acute distress while sitting in chair. Accompanied by husband.  NECK: No JVD; No carotid bruits CARDIAC: RRR, no murmurs, rubs, gallops RESPIRATORY:  Clear to auscultation without rales, wheezing or rhonchi  ABDOMEN: Soft, non-tender, non-distended EXTREMITIES:  No edema; No deformity   ASSESSMENT AND PLAN: .   Chronic HFrEF (EF 35-40%) Cardiomyopathy diagnosed in 2025 with recurrent ADHF admissions; currently clinically stable. Reports stable baseline SOB. Euvolemic on exam.  Labs from 10/2024: Mg normal, potassium 3.6, creatinine 1.16 Restart metoprolol  succinate 12.5 mg  daily; avoid further titration given prior syncope on 25 mg. Low threshold to discontinue if syncope recurs. Continue losartan  25 mg daily, torsemide  30 mg daily, potassium chloride  20 mEq daily. GDMT limited by prior syncope (Entresto , spironolactone ) and UTI (SGLT2 inhibitor). Patient not interested in retrial of spironolactone  at this time. Avoid SGLT2 inhibitor given prior UTI. Encouraged low sodium diet, fluid restriction <2L, and daily weights. Educated to contact our office for weight gain of 2 lbs overnight or 5 lbs in one week. ED precautions discussed. Consider repeat ECHO at next follow up visit.   Coronary artery disease Hyperlipidemia, LDL goal <70. Multivessel CAD without targets for revascularization. Denies anginal symptoms. No indication for further ischemic evaluation at this time. Start low-dose beta-blocker as above. Continue aspirin  81 mg daily, atorvastatin  80 mg nightly  Primary hypertension BP 122/62 today. In the setting of HFrEF, continue GDMT as above. Encourage physical activity for 150 minutes per week or as best tolerated and a heart-healthy low-sodium diet. Discussed limiting sodium intake to <2 grams daily.    Dispo: Follow up in 3 months.   Signed, Lorette CINDERELLA Kapur, PA-C  "

## 2024-12-01 ENCOUNTER — Ambulatory Visit: Admitting: Physician Assistant

## 2024-12-03 ENCOUNTER — Ambulatory Visit: Attending: Physician Assistant | Admitting: Physician Assistant

## 2024-12-03 ENCOUNTER — Encounter: Payer: Self-pay | Admitting: Physician Assistant

## 2024-12-03 VITALS — BP 122/62 | HR 91 | Ht 62.0 in | Wt 117.2 lb

## 2024-12-03 DIAGNOSIS — I5020 Unspecified systolic (congestive) heart failure: Secondary | ICD-10-CM | POA: Insufficient documentation

## 2024-12-03 DIAGNOSIS — I10 Essential (primary) hypertension: Secondary | ICD-10-CM | POA: Diagnosis not present

## 2024-12-03 DIAGNOSIS — I2510 Atherosclerotic heart disease of native coronary artery without angina pectoris: Secondary | ICD-10-CM | POA: Insufficient documentation

## 2024-12-03 DIAGNOSIS — E785 Hyperlipidemia, unspecified: Secondary | ICD-10-CM | POA: Insufficient documentation

## 2024-12-03 MED ORDER — METOPROLOL SUCCINATE ER 25 MG PO TB24: 12.5000 mg | ORAL_TABLET | Freq: Every day | ORAL | 3 refills | Status: AC

## 2024-12-03 NOTE — Patient Instructions (Signed)
 Medication Instructions:  Your physician has recommended you make the following change in your medication:   -Start Toprol  XL (Metoprolol ) 12.5 mg once daily   *If you need a refill on your cardiac medications before your next appointment, please call your pharmacy*  Lab Work: None If you have labs (blood work) drawn today and your tests are completely normal, you will receive your results only by: MyChart Message (if you have MyChart) OR A paper copy in the mail If you have any lab test that is abnormal or we need to change your treatment, we will call you to review the results.  Testing/Procedures: None  Follow-Up: At [facility], you and your health needs are our priority.  As part of our continuing mission to provide you with exceptional heart care, our providers are all part of one team.  This team includes your primary Cardiologist (physician) and Advanced Practice Providers or APPs (Physician Assistants and Nurse Practitioners) who all work together to provide you with the care you need, when you need it.  Your next appointment:   3 month(s)  Provider:   You may see Vishnu P Mallipeddi, MD or one of the following Advanced Practice Providers on your designated Care Team:   Brittany Strader, PA-C  [place], [place] Olivia [place], [place]     We recommend signing up for the patient portal called MyChart.  Sign up information is provided on this After Visit Summary.  MyChart is used to connect with patients for Virtual Visits (Telemedicine).  Patients are able to view lab/test results, encounter notes, upcoming appointments, etc.  Non-urgent messages can be sent to your provider as well.   To learn more about what you can do with MyChart, go to [email or web address].   Other Instructions Increase water intake to 40 oz daily

## 2025-02-15 ENCOUNTER — Other Ambulatory Visit

## 2025-02-15 ENCOUNTER — Ambulatory Visit: Admitting: Hematology

## 2025-03-03 ENCOUNTER — Ambulatory Visit: Admitting: Internal Medicine
